# Patient Record
Sex: Female | Born: 1985
Health system: Southern US, Community
[De-identification: ages and names within clinical notes are randomized; demographics above are authoritative.]

## PROBLEM LIST (undated history)

## (undated) DIAGNOSIS — F1910 Other psychoactive substance abuse, uncomplicated: Secondary | ICD-10-CM

## (undated) DIAGNOSIS — F1120 Opioid dependence, uncomplicated: Secondary | ICD-10-CM

## (undated) HISTORY — PX: VALVE REPLACEMENT: SUR13

## (undated) HISTORY — PX: OTHER SURGICAL HISTORY: SHX169

---

## 2019-01-06 DIAGNOSIS — A419 Sepsis, unspecified organism: Secondary | ICD-10-CM

## 2019-01-06 HISTORY — DX: Sepsis, unspecified organism: A41.9

## 2019-01-07 DIAGNOSIS — R7881 Bacteremia: Secondary | ICD-10-CM

## 2019-01-07 DIAGNOSIS — I330 Acute and subacute infective endocarditis: Secondary | ICD-10-CM | POA: Insufficient documentation

## 2019-01-07 DIAGNOSIS — F1910 Other psychoactive substance abuse, uncomplicated: Secondary | ICD-10-CM | POA: Insufficient documentation

## 2019-01-07 HISTORY — DX: Bacteremia: R78.81

## 2019-01-07 HISTORY — DX: Other psychoactive substance abuse, uncomplicated: F19.10

## 2019-01-07 HISTORY — DX: Acute and subacute infective endocarditis: I33.0

## 2019-01-09 DIAGNOSIS — D649 Anemia, unspecified: Secondary | ICD-10-CM | POA: Insufficient documentation

## 2019-01-09 HISTORY — DX: Anemia, unspecified: D64.9

## 2019-01-10 DIAGNOSIS — B999 Unspecified infectious disease: Secondary | ICD-10-CM | POA: Insufficient documentation

## 2019-01-10 DIAGNOSIS — D509 Iron deficiency anemia, unspecified: Secondary | ICD-10-CM | POA: Insufficient documentation

## 2019-01-10 HISTORY — DX: Iron deficiency anemia, unspecified: D50.9

## 2019-01-11 DIAGNOSIS — S81802A Unspecified open wound, left lower leg, initial encounter: Secondary | ICD-10-CM | POA: Insufficient documentation

## 2019-01-11 HISTORY — DX: Unspecified open wound, left lower leg, initial encounter: S81.802A

## 2019-01-30 DIAGNOSIS — G4700 Insomnia, unspecified: Secondary | ICD-10-CM | POA: Insufficient documentation

## 2019-01-30 HISTORY — DX: Insomnia, unspecified: G47.00

## 2019-03-29 ENCOUNTER — Emergency Department (HOSPITAL_COMMUNITY): Payer: Medicaid Other

## 2019-03-29 ENCOUNTER — Emergency Department (HOSPITAL_BASED_OUTPATIENT_CLINIC_OR_DEPARTMENT_OTHER): Payer: Medicaid Other

## 2019-03-29 ENCOUNTER — Other Ambulatory Visit: Payer: Self-pay

## 2019-03-29 ENCOUNTER — Emergency Department (HOSPITAL_COMMUNITY)
Admission: EM | Admit: 2019-03-29 | Discharge: 2019-03-29 | Disposition: A | Discharge status: Home / Self Care | Payer: Medicaid Other | Attending: Emergency Medicine | Admitting: Emergency Medicine

## 2019-03-29 DIAGNOSIS — M7989 Other specified soft tissue disorders: Secondary | ICD-10-CM | POA: Diagnosis not present

## 2019-03-29 DIAGNOSIS — R609 Edema, unspecified: Secondary | ICD-10-CM | POA: Diagnosis present

## 2019-03-29 DIAGNOSIS — R52 Pain, unspecified: Secondary | ICD-10-CM

## 2019-03-29 DIAGNOSIS — R7989 Other specified abnormal findings of blood chemistry: Secondary | ICD-10-CM | POA: Insufficient documentation

## 2019-03-29 DIAGNOSIS — L03116 Cellulitis of left lower limb: Secondary | ICD-10-CM | POA: Diagnosis not present

## 2019-03-29 DIAGNOSIS — L538 Other specified erythematous conditions: Secondary | ICD-10-CM

## 2019-03-29 LAB — COMPREHENSIVE METABOLIC PANEL
ALT: 323 U/L — ABNORMAL HIGH (ref 0–44)
AST: 166 U/L — ABNORMAL HIGH (ref 15–41)
Albumin: 3.2 g/dL — ABNORMAL LOW (ref 3.5–5.0)
Alkaline Phosphatase: 131 U/L — ABNORMAL HIGH (ref 38–126)
Anion gap: 7 (ref 5–15)
BUN: 11 mg/dL (ref 6–20)
CO2: 27 mmol/L (ref 22–32)
Calcium: 8.3 mg/dL — ABNORMAL LOW (ref 8.9–10.3)
Chloride: 106 mmol/L (ref 98–111)
Creatinine, Ser: 0.5 mg/dL (ref 0.44–1.00)
GFR calc Af Amer: >60 mL/min (ref >60)
GFR calc non Af Amer: >60 mL/min (ref >60)
Glucose, Bld: 117 mg/dL — ABNORMAL HIGH (ref 70–99)
Potassium: 3.1 mmol/L — ABNORMAL LOW (ref 3.5–5.1)
Sodium: 140 mmol/L (ref 135–145)
Total Bilirubin: 0.5 mg/dL (ref 0.3–1.2)
Total Protein: 6.6 g/dL (ref 6.5–8.1)

## 2019-03-29 LAB — RAPID HIV SCREEN (HIV 1/2 AB+AG)
HIV 1/2 Antibodies: NONREACTIVE
HIV-1 P24 Antigen - HIV24: NONREACTIVE

## 2019-03-29 LAB — CBC
HCT: 34.2 % — ABNORMAL LOW (ref 36.0–46.0)
Hemoglobin: 10.6 g/dL — ABNORMAL LOW (ref 12.0–15.0)
MCH: 24.9 pg — ABNORMAL LOW (ref 26.0–34.0)
MCHC: 31 g/dL (ref 30.0–36.0)
MCV: 80.5 fL (ref 80.0–100.0)
Platelets: 198 10*3/uL (ref 150–400)
RBC: 4.25 MIL/uL (ref 3.87–5.11)
RDW: 15.9 % — ABNORMAL HIGH (ref 11.5–15.5)
WBC: 6.3 10*3/uL (ref 4.0–10.5)
nRBC: 0 % (ref 0.0–0.2)

## 2019-03-29 LAB — PROTIME-INR
INR: 1.1 (ref 0.8–1.2)
Prothrombin Time: 14.3 seconds (ref 11.4–15.2)

## 2019-03-29 LAB — I-STAT BETA HCG BLOOD, ED (MC, WL, AP ONLY): I-stat hCG, quantitative: <5 m[IU]/mL (ref <5)

## 2019-03-29 IMAGING — DX DG TIBIA/FIBULA 2V*L*
4 series · 4 of 4 positions shown · non-contrast
Comparison: None.

CLINICAL DATA: Cellulitis

EXAM:
LEFT TIBIA AND FIBULA - 2 VIEW

[tibia ap (1 of 2)]
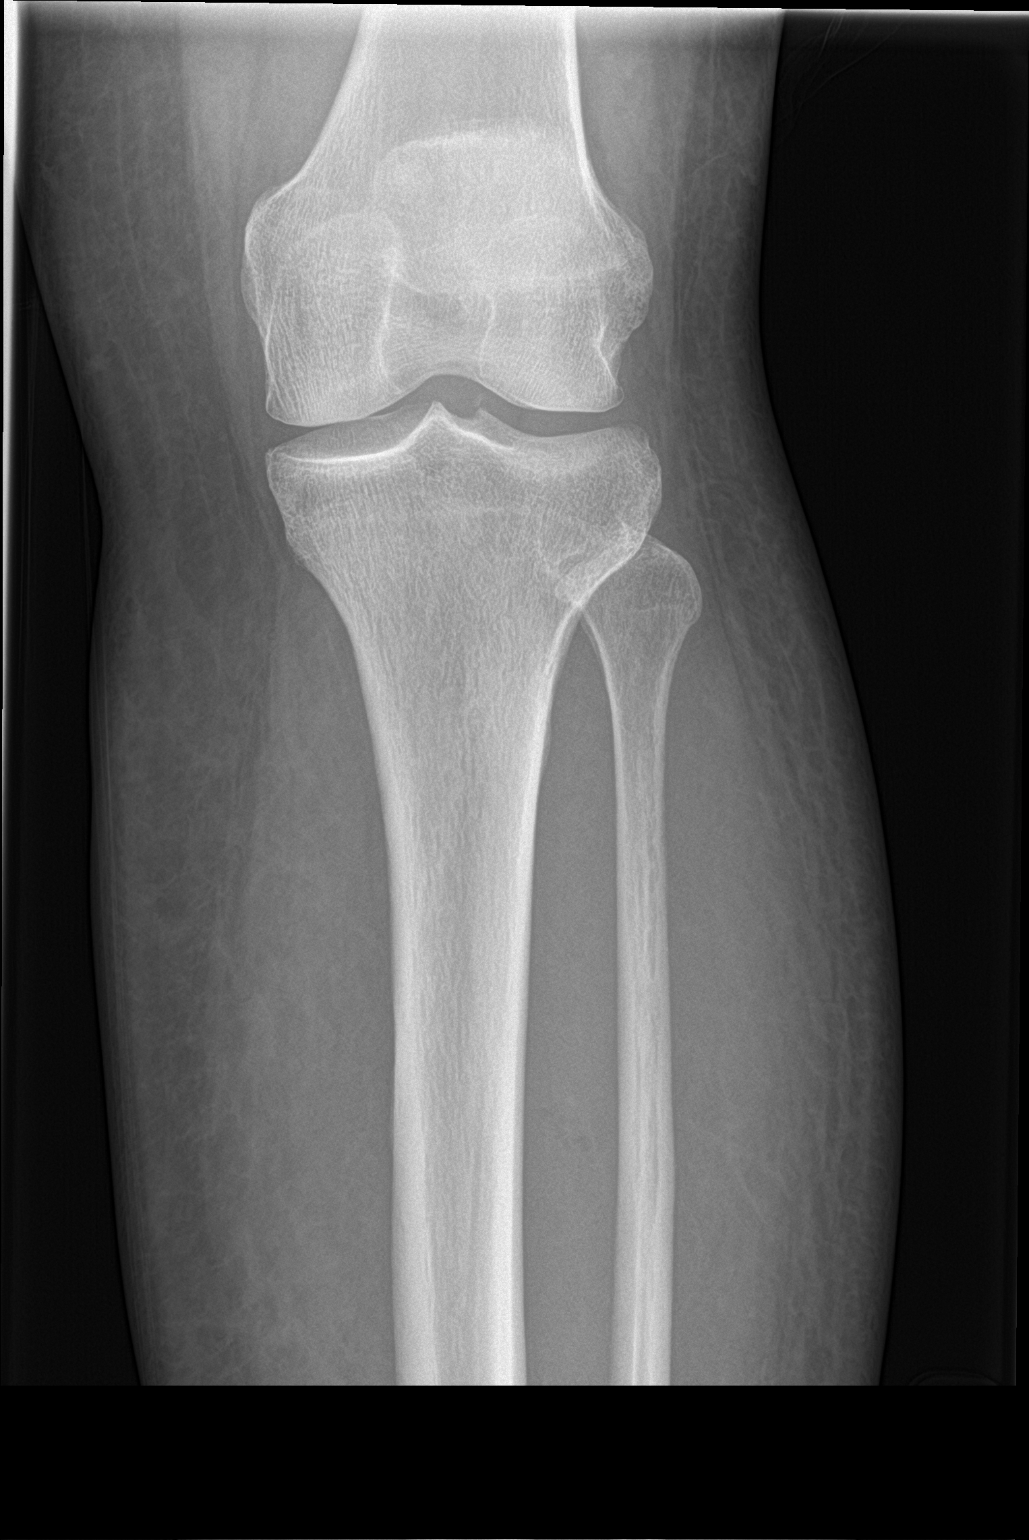

[tibia ap (2 of 2)]
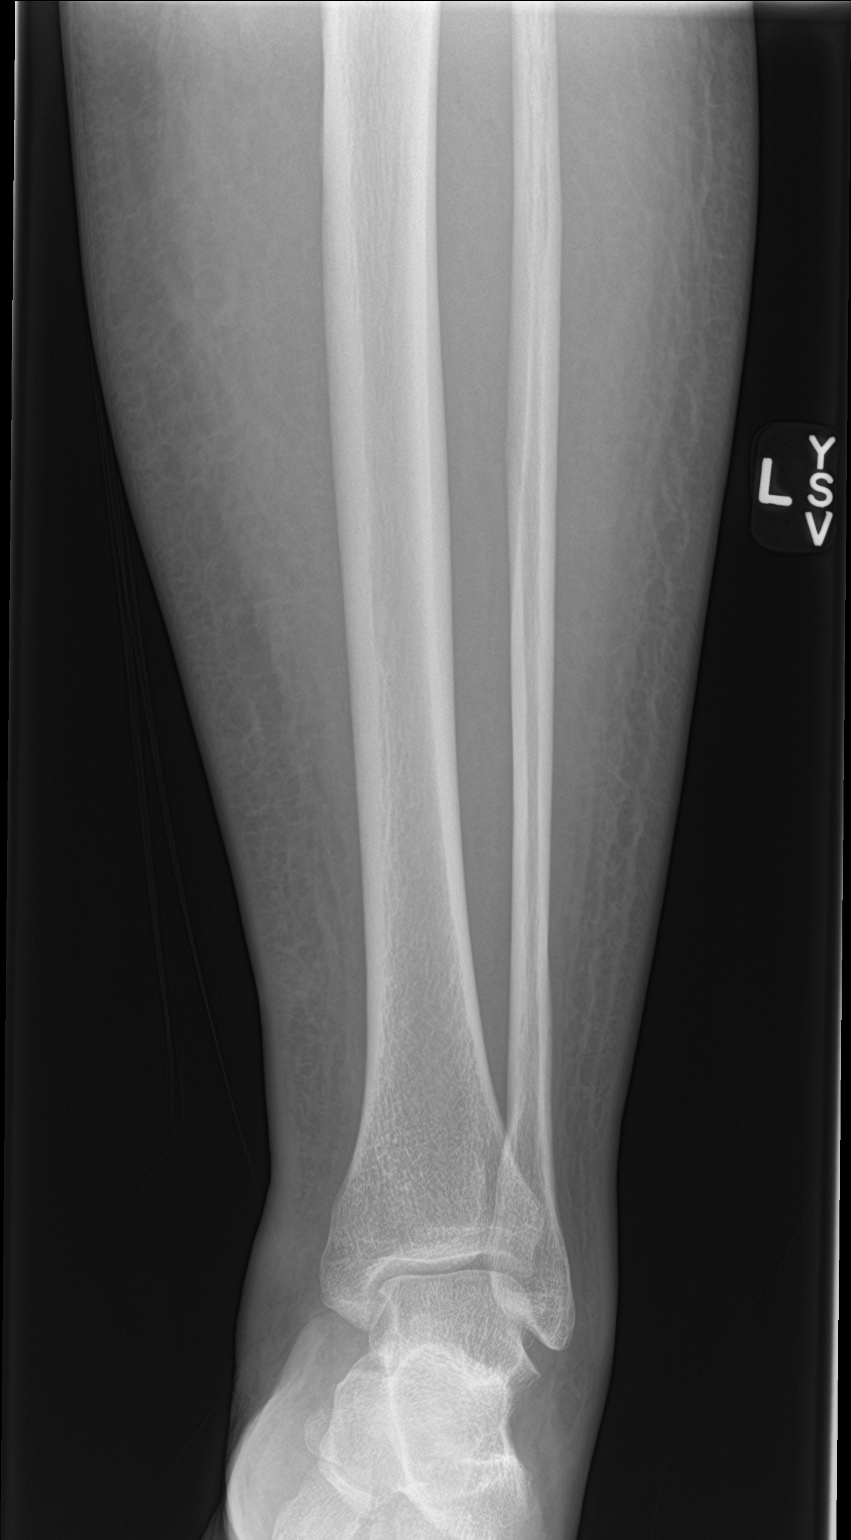

[tibia lat (1 of 2)]
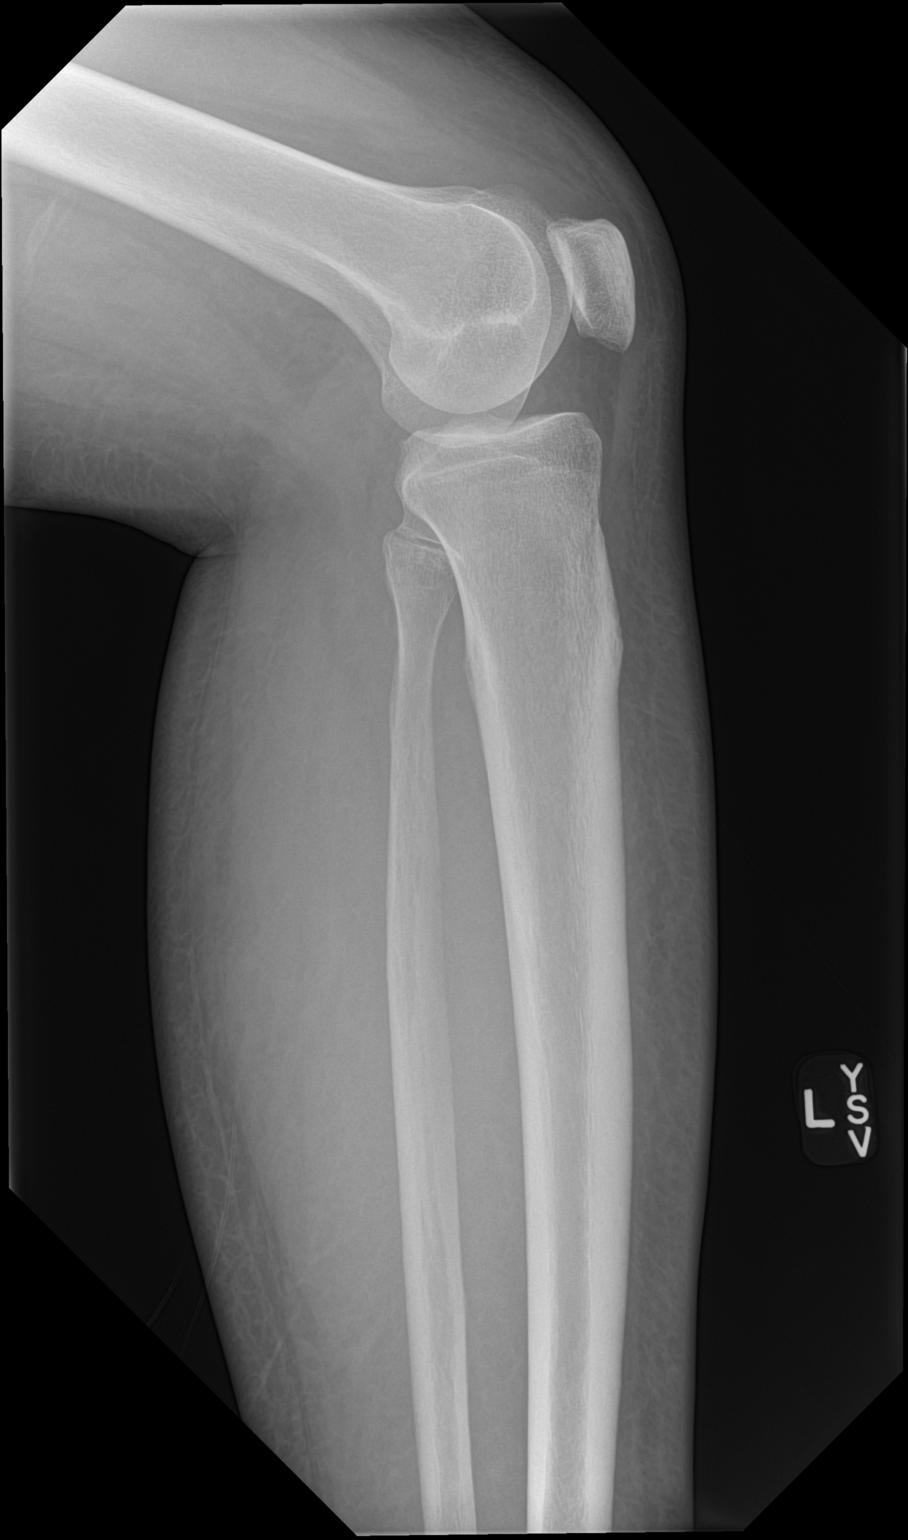

[tibia lat (2 of 2)]
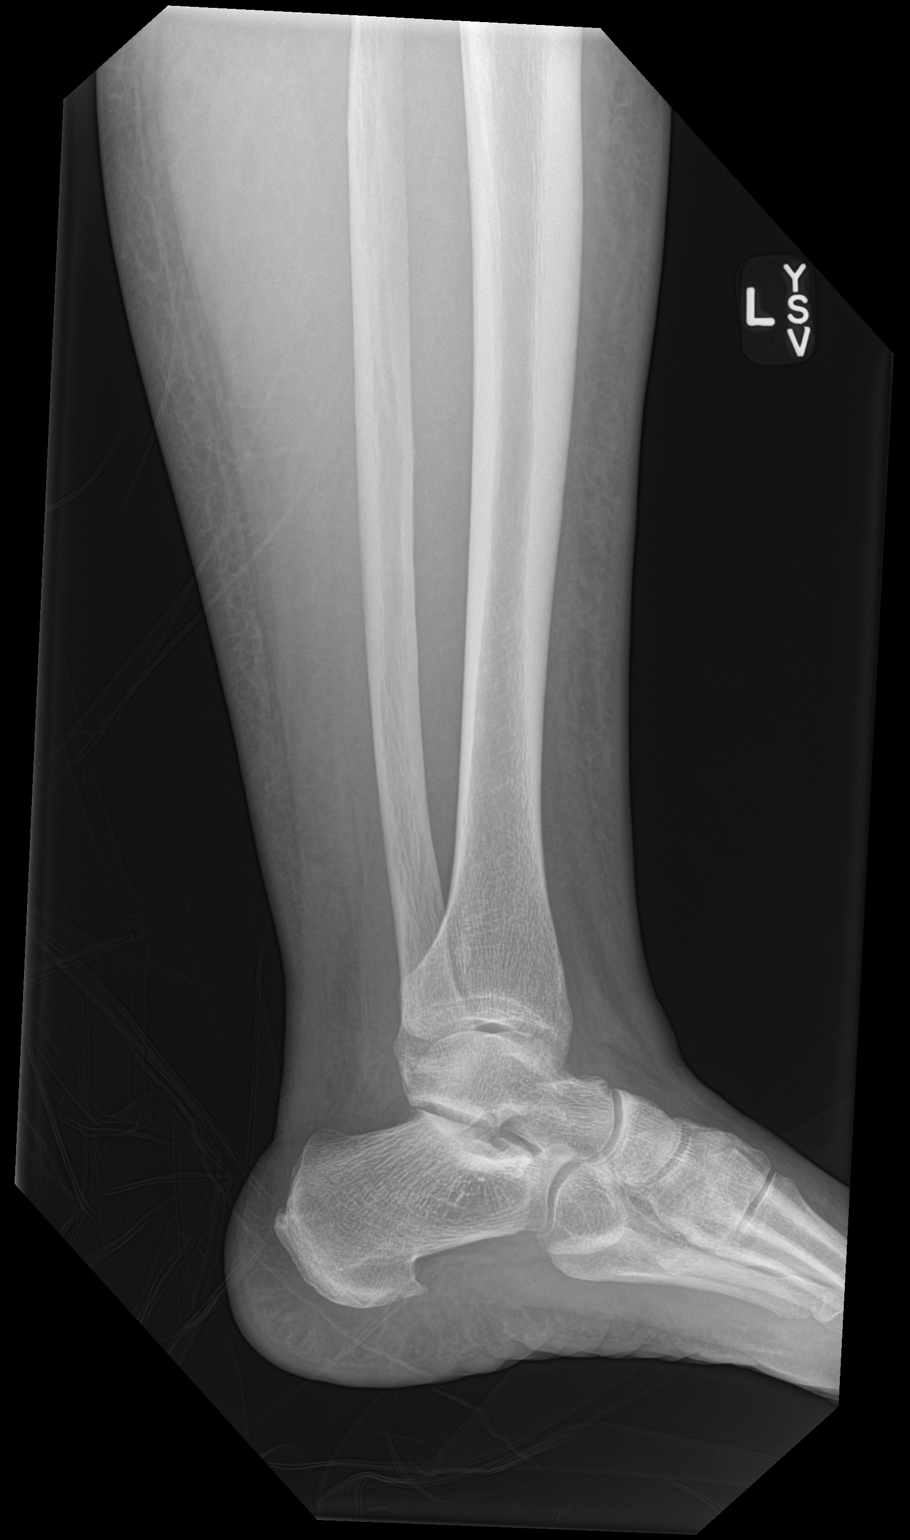

[4 of 4 positions shown; findings below may reference images not displayed]

FINDINGS: There is no evidence of fracture or dislocation. No focal osseous
demineralization to suggest osteomyelitis is identified. Plantar and
posterior calcaneal enthesophytes are noted. There is no soft tissue
gas or radiopaque foreign body.
IMPRESSION: No radiographic evidence of osteomyelitis. No soft tissue gas.

## 2019-03-29 MED ORDER — CLINDAMYCIN HCL 300 MG PO CAPS: 600.0000 mg | ORAL_CAPSULE | Freq: Three times a day (TID) | ORAL | 0 refills | Status: DC

## 2019-03-29 MED ORDER — SODIUM CHLORIDE 0.9% FLUSH: 3.0000 mL | Freq: Once | INTRAVENOUS | Status: DC

## 2019-03-29 NOTE — Progress Notes (Signed)
VASCULAR LAB PRELIMINARY  PRELIMINARY  PRELIMINARY  PRELIMINARY  Left lower extremity venous duplex completed.    Preliminary report:  See CV proc for preliminary results.   [place], Charge Nurse, report.  Eldrige Pitkin, RVT 03/29/2019, 6:46 PM

## 2019-03-29 NOTE — ED Provider Notes (Signed)
[facility] EMERGENCY DEPARTMENT Provider Note   CSN: [id number] Arrival date & time: 03/29/19  1727     History   Chief Complaint Chief Complaint  Patient presents with   Leg Swelling    HPI Latoya [name] is a 33 y.o. 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  No interventions.  No alleviating factors.  Exacerbated with walking, palpation.  Denies injury.  Denies loss of sensation or weakness distally.  Last heroin use was 3 months ago.  Has a wound in the left medial lower leg admits that this was an area she used to inject heroin into but has not used in 3 months.  No fever, chest pain, shortness of breath, cough.  No h/o DVT/PE, hormone therapy, recent travel or immobilization.  Last hospitalization and angio for vegetation debulking in September but no other recent surgeries.     HPI  No past medical history on file.  There are no active problems to display for this patient.   ** The histories are not reviewed yet. Please review them in the "History" navigator section and refresh this [place].   OB History   No obstetric history on file.      Home Medications    Prior to Admission medications   Medication Sig Start Date End Date Taking? Authorizing Provider  clindamycin (CLEOCIN) 300 MG capsule Take 2 capsules (600 mg total) by mouth 3 (three) times daily for 10 days. 03/29/19 04/08/19  Kinnie Feil, PA-C    Family History No family history on file.  Social History Social History   Tobacco Use   Smoking status: Not on file  Substance Use Topics   Alcohol use: Not on file   Drug use: Not on file     Allergies   Patient has no known allergies.   Review of Systems Review of Systems  Cardiovascular: Positive for leg  swelling (left).  Musculoskeletal: Positive for myalgias.  Skin: Positive for color change.  All other systems reviewed and are negative.    Physical Exam Updated Vital Signs BP 114/84 (BP Location: Right Arm)    Pulse 99    Temp 98.7 F (37.1 C) (Oral)    Resp 18    Ht 5\' 6"  (1.676 m)    Wt 72.6 kg    LMP 02/26/2019 (Approximate)    SpO2 100%    BMI 25.82 kg/m   Physical Exam Vitals signs and nursing note reviewed.  Constitutional:      Appearance: She is well-developed.     Comments: Non toxic in NAD  HENT:     Head: Normocephalic and atraumatic.     Nose: Nose normal.  Eyes:     Conjunctiva/sclera: Conjunctivae normal.  Neck:     Musculoskeletal: Normal range of motion.  Cardiovascular:     Rate and Rhythm: Normal rate and regular rhythm.     Comments:   1+ radial and DP pulses bilaterally.  Tenderness along distal left lower leg where there is erythema/cellulitis.   Pulmonary:     Effort: Pulmonary effort is normal.     Breath sounds: Normal breath sounds.  Abdominal:     General: Bowel sounds are normal.     Palpations: Abdomen is soft.     Comments: No G/R/R. No suprapubic or CVA tenderness. Negative Murphy's and  McBurney's. Active BS to lower quadrants.   Musculoskeletal: Normal range of motion.     Comments: No focal bony tenderness, edema or effusion of the left knee or popliteal space, full range of motion. Mild tenderness diffusely on the left ankle where there is erythema, edema/cellulitis.  Full range of motion of the ankle without significant pain.  No focal tenderness of the foot, toes. Compartments of the left thigh and left lower extremity soft  Skin:    General: Skin is warm and dry.     Capillary Refill: Capillary refill takes less than 2 seconds.     Comments: Well-demarcated erythema in the left lower extremity, see photo below.  Erythema extends from ankle up to mid tib/fib.  Associated swelling, warmth.  Old appearing scab/wound in the left lower  medial leg.  Neurological:     Mental Status: She is alert.     Comments:  sensation and strength is intact in upper and lower extremities.  Psychiatric:        Behavior: Behavior normal.        ED Treatments / Results  Labs (all labs ordered are listed, but only abnormal results are displayed) Labs Reviewed  COMPREHENSIVE METABOLIC PANEL - Abnormal; Notable for the following components:      Result Value   Potassium 3.1 (*)    Glucose, Bld 117 (*)    Calcium 8.3 (*)    Albumin 3.2 (*)    AST 166 (*)    ALT 323 (*)    Alkaline Phosphatase 131 (*)    All other components within normal limits  CBC - Abnormal; Notable for the following components:   Hemoglobin 10.6 (*)    HCT 34.2 (*)    MCH 24.9 (*)    RDW 15.9 (*)    All other components within normal limits  PROTIME-INR  RAPID HIV SCREEN (HIV 1/2 AB+AG)  HEPATITIS PANEL, ACUTE  I-STAT BETA HCG BLOOD, ED ([facility], [facility], [facility] ONLY)    EKG None  Radiology Dg Tibia/fibula Left  Result Date: 03/29/2019 CLINICAL DATA:  Cellulitis EXAM: LEFT TIBIA AND FIBULA - 2 VIEW COMPARISON:  None. FINDINGS: There is no evidence of fracture or dislocation. No focal osseous demineralization to suggest osteomyelitis is identified. Plantar and posterior calcaneal enthesophytes are noted. There is no soft tissue gas or radiopaque foreign body. IMPRESSION: No radiographic evidence of osteomyelitis. No soft tissue gas. Electronically Signed   By: Romona Curlsyler  Litton M.D.   On: 03/29/2019 20:13   Vas [place] Lower Extremity Venous (dvt) (only [facility])  Result Date: 03/29/2019  Lower Venous Study Indications: Pain, Swelling, and Erythema.  Limitations: Edema and patient's pain with touch in the calf. Comparison Study: No prior study on file for comparison. Performing Technologist: Sherren Kernsandace Kanady RVS  Examination Guidelines: A complete evaluation includes B-mode imaging, spectral Doppler, color Doppler, and power Doppler as needed of all accessible portions of each  vessel. Bilateral testing is considered an integral part of a complete examination. Limited examinations for reoccurring indications may be performed as noted.  +-----+---------------+---------+-----------+----------+--------------+  RIGHT Compressibility Phasicity Spontaneity Properties Thrombus Aging  +-----+---------------+---------+-----------+----------+--------------+  CFV   Full            Yes       Yes                                    +-----+---------------+---------+-----------+----------+--------------+   +---------+---------------+---------+-----------+----------+--------------+  LEFT      Compressibility Phasicity Spontaneity Properties Thrombus Aging  +---------+---------------+---------+-----------+----------+--------------+  CFV       Full            Yes       Yes                                    +---------+---------------+---------+-----------+----------+--------------+  SFJ       Full                                                             +---------+---------------+---------+-----------+----------+--------------+  FV Prox   Full                                                             +---------+---------------+---------+-----------+----------+--------------+  FV Mid    Full                                                             +---------+---------------+---------+-----------+----------+--------------+  FV Distal Full                                                             +---------+---------------+---------+-----------+----------+--------------+  PFV       Full                                                             +---------+---------------+---------+-----------+----------+--------------+  POP       Full            Yes       Yes                                    +---------+---------------+---------+-----------+----------+--------------+  PTV       Full                                                              +---------+---------------+---------+-----------+----------+--------------+  PERO      Full                                                             +---------+---------------+---------+-----------+----------+--------------+  Summary: Right: No evidence of common femoral vein obstruction. Left: There is no evidence of deep vein thrombosis in the lower extremity. Ultrasound characteristics of multiple enlarged lymph nodes noted in the groin and proximal thigh.  *See table(s) above for measurements and observations.    Preliminary     Procedures Procedures (including critical care time)  Medications Ordered in ED Medications  sodium chloride flush (NS) 0.9 % injection 3 mL (has no administration in time range)     Initial Impression / Assessment and Plan / ED Course  I have reviewed the triage vital signs and the nursing notes.  Pertinent labs & imaging results that were available during my care of the patient were reviewed by me and considered in my medical decision making (see chart for details).  Clinical Course as of Mar 29 2139  Sun Mar 29, 2019  1930 AST(!): 166 [CG]  1930 ALT(!): 323 [CG]  1930 Alkaline Phosphatase(!): 131 [CG]  1930 Hemoglobin(!): 10.6 [CG]  1930 Potassium(!): 3.1 [CG]  2052 IMPRESSION: No radiographic evidence of osteomyelitis. No soft tissue gas.    DG Tibia/Fibula Left [CG]    Clinical Course User Index [CG] Liberty Handy, PA-C   ER work-up initiated in triage, reviewed and interpreted by me.  Vascular ultrasound is negative for DVT but shows some reactive local lymphadenopathy.  Clinically leg redness swelling warmth and pain likely from cellulitis.  There is a scab in the left lower medial aspect of the leg where patient states she has injected IV drugs into before, this appears to be the epicenter of the erythema/cellulitis.  Extremities neurovascularly intact.  Compartments are soft.  Given history of IV drug use, x-ray was obtained to  evaluate for foreign bodies which was negative.  No signs of osteomyelitis or soft tissue gas.  No leukocytosis.  No signs of SIRS.  She is appropriate for outpatient antibiotics, NSAIDs, elevation.  I marked the area of erythema and instructed patient to monitor for worsening redness in the next 48 hours of antibiotics.  She understands reasons to return to the ER for reevaluation.  No CP, cough, SOB to suggest more occult infection like endocarditis, etc.   Lab work obtained at triage including LFTs noted to be elevated.  Patient denies recent infectious symptoms like fever, nausea, vomiting or diarrhea.  Denies history of hepatitis, states when she was admitted 2 months ago to the hospital she had tests done that were negative and states she has not used IV drugs since.  No abdominal tenderness, negative Murphy sign.  Vital signs normal without fever.  Given elevation of LFTs, IV drug use, acute hepatitis panel and HIV ordered and pending at discharge.  Explained this to patient who understands she needs to follow-up on the results and schedule an appointment with PCP for repeat LFTs and likely outpatient ultrasound.  Return precautions discussed.  She is comfortable with this.  Final Clinical Impressions(s) / ED Diagnoses   Final diagnoses:  Cellulitis of left leg  Elevated LFTs    ED Discharge Orders         Ordered    clindamycin (CLEOCIN) 300 MG capsule  3 times daily     03/29/19 2120           Jerrell Mylar 03/29/19 2140    Derwood Kaplan, MD 03/31/19 2308

## 2019-03-29 NOTE — ED Notes (Signed)
Patient transported to X-ray 

## 2019-03-29 NOTE — Discharge Instructions (Signed)
You were seen in the ER for swelling redness and warmth in your left leg.  Ultrasound did not reveal a blood clot.  You have an infection of the superficial skin that is called cellulitis.  This is treated with antibiotics.  Take clindamycin as prescribed.  Elevate your leg.  Apply moist heat to the area to help with infection.  Alternate ibuprofen or acetaminophen every 6-8 hours for pain.  Any infection can worsen, return to the ER of the redness is past the marked lines despite antibiotics for 48 hours.  Return to the ER if there is fever greater than 100, generalized malaise.  Your liver function tests were elevated today, the cause of this is still unclear.  You do not have any infection symptoms or symptoms that would suggest a gallbladder infection or blockage.  We have tested you for hepatitis and HIV as these are some reasons your liver function tests can be elevated.  You need to follow-up with a primary care doctor in the next few weeks to have this blood test rechecked to make sure your liver function tests have normalized.  You may also need an outpatient ultrasound of your liver.  Return to the ER if there is any chest pain, shortness of breath, fever, abdominal pain, vomiting or diarrhea.

## 2019-03-29 NOTE — ED Triage Notes (Signed)
The pt is c/o lt leg pain and swelling for 3-4 days  No known injury  No history  lmp one month ago

## 2019-03-30 LAB — HEPATITIS PANEL, ACUTE
HCV Ab: REACTIVE — AB
Hep A IgM: NONREACTIVE
Hep B C IgM: NONREACTIVE
Hepatitis B Surface Ag: NONREACTIVE

## 2019-04-04 ENCOUNTER — Emergency Department (HOSPITAL_COMMUNITY): Payer: Medicaid Other

## 2019-04-04 ENCOUNTER — Inpatient Hospital Stay (HOSPITAL_COMMUNITY)
Admission: EM | Admit: 2019-04-04 | Discharge: 2019-04-07 | DRG: 580 | Disposition: A | Discharge status: Home / Self Care | Payer: Medicaid Other | Attending: Student in an Organized Health Care Education/Training Program | Admitting: Student in an Organized Health Care Education/Training Program

## 2019-04-04 ENCOUNTER — Encounter (HOSPITAL_COMMUNITY): Payer: Self-pay | Admitting: Emergency Medicine

## 2019-04-04 ENCOUNTER — Other Ambulatory Visit: Payer: Self-pay

## 2019-04-04 DIAGNOSIS — B1920 Unspecified viral hepatitis C without hepatic coma: Secondary | ICD-10-CM | POA: Diagnosis present

## 2019-04-04 DIAGNOSIS — G4700 Insomnia, unspecified: Secondary | ICD-10-CM | POA: Diagnosis present

## 2019-04-04 DIAGNOSIS — R768 Other specified abnormal immunological findings in serum: Secondary | ICD-10-CM | POA: Diagnosis not present

## 2019-04-04 DIAGNOSIS — L0291 Cutaneous abscess, unspecified: Secondary | ICD-10-CM | POA: Diagnosis present

## 2019-04-04 DIAGNOSIS — D649 Anemia, unspecified: Secondary | ICD-10-CM | POA: Diagnosis present

## 2019-04-04 DIAGNOSIS — Z79899 Other long term (current) drug therapy: Secondary | ICD-10-CM

## 2019-04-04 DIAGNOSIS — Z8679 Personal history of other diseases of the circulatory system: Secondary | ICD-10-CM | POA: Diagnosis not present

## 2019-04-04 DIAGNOSIS — Z20828 Contact with and (suspected) exposure to other viral communicable diseases: Secondary | ICD-10-CM | POA: Diagnosis present

## 2019-04-04 DIAGNOSIS — L03115 Cellulitis of right lower limb: Secondary | ICD-10-CM

## 2019-04-04 DIAGNOSIS — L02415 Cutaneous abscess of right lower limb: Secondary | ICD-10-CM | POA: Diagnosis present

## 2019-04-04 DIAGNOSIS — F1123 Opioid dependence with withdrawal: Secondary | ICD-10-CM | POA: Diagnosis present

## 2019-04-04 DIAGNOSIS — F1199 Opioid use, unspecified with unspecified opioid-induced disorder: Secondary | ICD-10-CM | POA: Diagnosis present

## 2019-04-04 DIAGNOSIS — L02416 Cutaneous abscess of left lower limb: Secondary | ICD-10-CM | POA: Diagnosis present

## 2019-04-04 DIAGNOSIS — F1190 Opioid use, unspecified, uncomplicated: Secondary | ICD-10-CM | POA: Diagnosis not present

## 2019-04-04 DIAGNOSIS — L03116 Cellulitis of left lower limb: Secondary | ICD-10-CM | POA: Diagnosis present

## 2019-04-04 DIAGNOSIS — R011 Cardiac murmur, unspecified: Secondary | ICD-10-CM

## 2019-04-04 DIAGNOSIS — Z590 Homelessness: Secondary | ICD-10-CM

## 2019-04-04 DIAGNOSIS — Z888 Allergy status to other drugs, medicaments and biological substances status: Secondary | ICD-10-CM | POA: Diagnosis not present

## 2019-04-04 DIAGNOSIS — F17210 Nicotine dependence, cigarettes, uncomplicated: Secondary | ICD-10-CM | POA: Diagnosis present

## 2019-04-04 DIAGNOSIS — Z8619 Personal history of other infectious and parasitic diseases: Secondary | ICD-10-CM | POA: Diagnosis not present

## 2019-04-04 DIAGNOSIS — F1193 Opioid use, unspecified with withdrawal: Secondary | ICD-10-CM | POA: Diagnosis not present

## 2019-04-04 DIAGNOSIS — B950 Streptococcus, group A, as the cause of diseases classified elsewhere: Secondary | ICD-10-CM | POA: Diagnosis not present

## 2019-04-04 HISTORY — DX: Other psychoactive substance abuse, uncomplicated: F19.10

## 2019-04-04 LAB — CBC WITH DIFFERENTIAL/PLATELET
Abs Immature Granulocytes: 0.03 10*3/uL (ref 0.00–0.07)
Basophils Absolute: 0 10*3/uL (ref 0.0–0.1)
Basophils Relative: 0 %
Eosinophils Absolute: 0.2 10*3/uL (ref 0.0–0.5)
Eosinophils Relative: 2 %
HCT: 39.8 % (ref 36.0–46.0)
Hemoglobin: 11.9 g/dL — ABNORMAL LOW (ref 12.0–15.0)
Immature Granulocytes: 0 %
Lymphocytes Relative: 34 %
Lymphs Abs: 2.6 10*3/uL (ref 0.7–4.0)
MCH: 24.4 pg — ABNORMAL LOW (ref 26.0–34.0)
MCHC: 29.9 g/dL — ABNORMAL LOW (ref 30.0–36.0)
MCV: 81.6 fL (ref 80.0–100.0)
Monocytes Absolute: 0.5 10*3/uL (ref 0.1–1.0)
Monocytes Relative: 7 %
Neutro Abs: 4.4 10*3/uL (ref 1.7–7.7)
Neutrophils Relative %: 57 %
Platelets: 404 10*3/uL — ABNORMAL HIGH (ref 150–400)
RBC: 4.88 MIL/uL (ref 3.87–5.11)
RDW: 16.5 % — ABNORMAL HIGH (ref 11.5–15.5)
WBC: 7.7 10*3/uL (ref 4.0–10.5)
nRBC: 0 % (ref 0.0–0.2)

## 2019-04-04 LAB — BASIC METABOLIC PANEL
Anion gap: 11 (ref 5–15)
BUN: 9 mg/dL (ref 6–20)
CO2: 24 mmol/L (ref 22–32)
Calcium: 9.2 mg/dL (ref 8.9–10.3)
Chloride: 105 mmol/L (ref 98–111)
Creatinine, Ser: 0.54 mg/dL (ref 0.44–1.00)
GFR calc Af Amer: >60 mL/min (ref >60)
GFR calc non Af Amer: >60 mL/min (ref >60)
Glucose, Bld: 95 mg/dL (ref 70–99)
Potassium: 3.9 mmol/L (ref 3.5–5.1)
Sodium: 140 mmol/L (ref 135–145)

## 2019-04-04 LAB — LACTIC ACID, PLASMA: Lactic Acid, Venous: 1.1 mmol/L (ref 0.5–1.9)

## 2019-04-04 LAB — I-STAT BETA HCG BLOOD, ED (MC, WL, AP ONLY): I-stat hCG, quantitative: <5 m[IU]/mL (ref <5)

## 2019-04-04 IMAGING — CR DG TIBIA/FIBULA 2V*L*
4 series · 4 of 4 positions shown · non-contrast
Comparison: Plain films left lower leg [DATE].

CLINICAL DATA: Skin wound on the anterior aspect of the left lower
leg due to IV drug abuse.

EXAM:
LEFT TIBIA AND FIBULA - 2 VIEW

[tibia ap (1 of 2)]
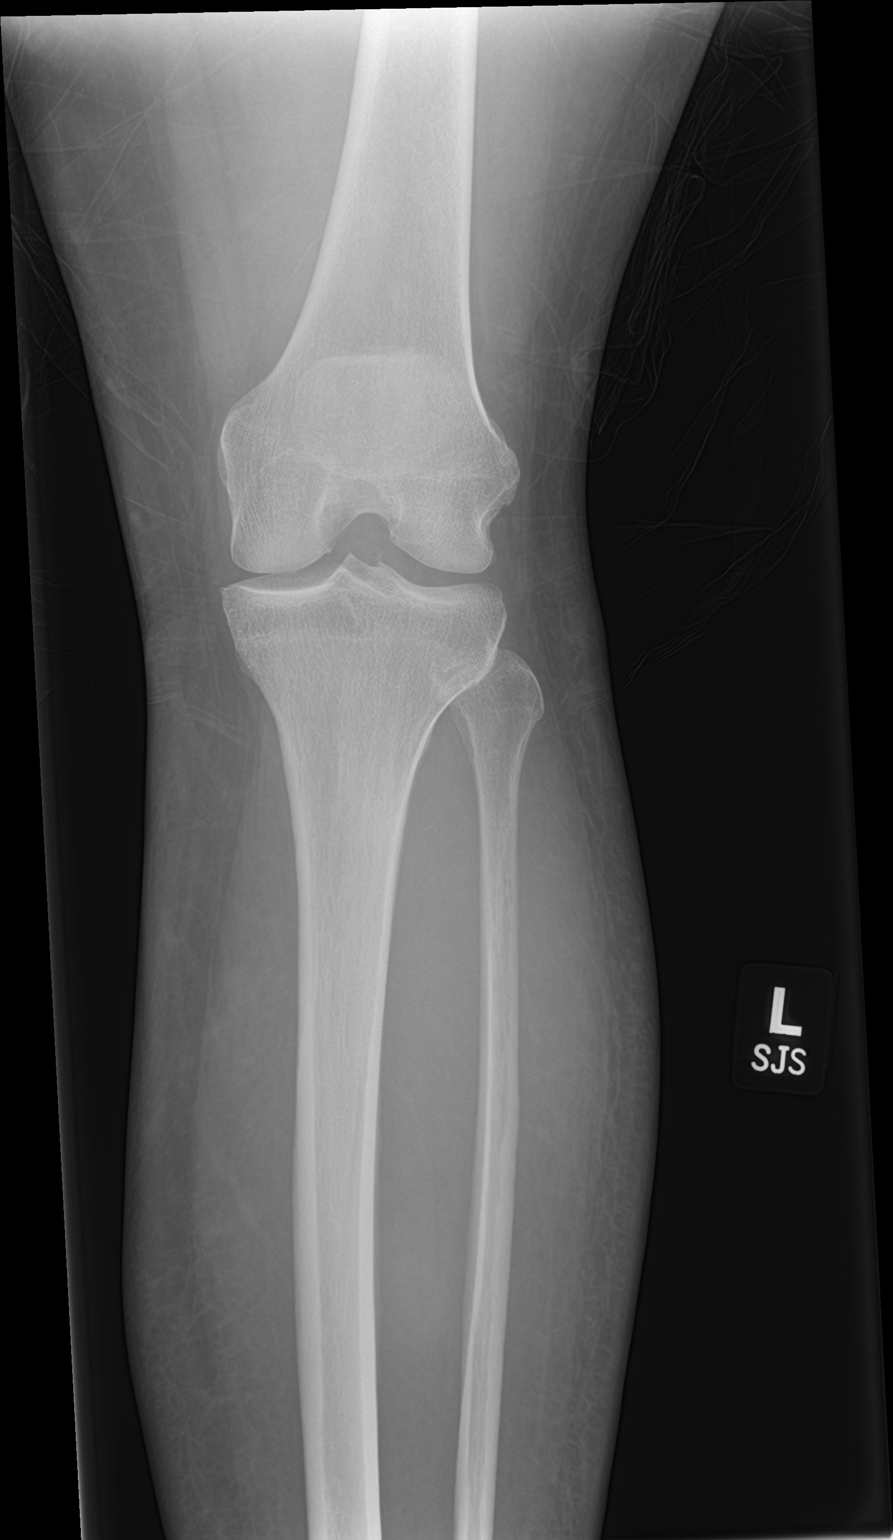

[tibia ap (2 of 2)]
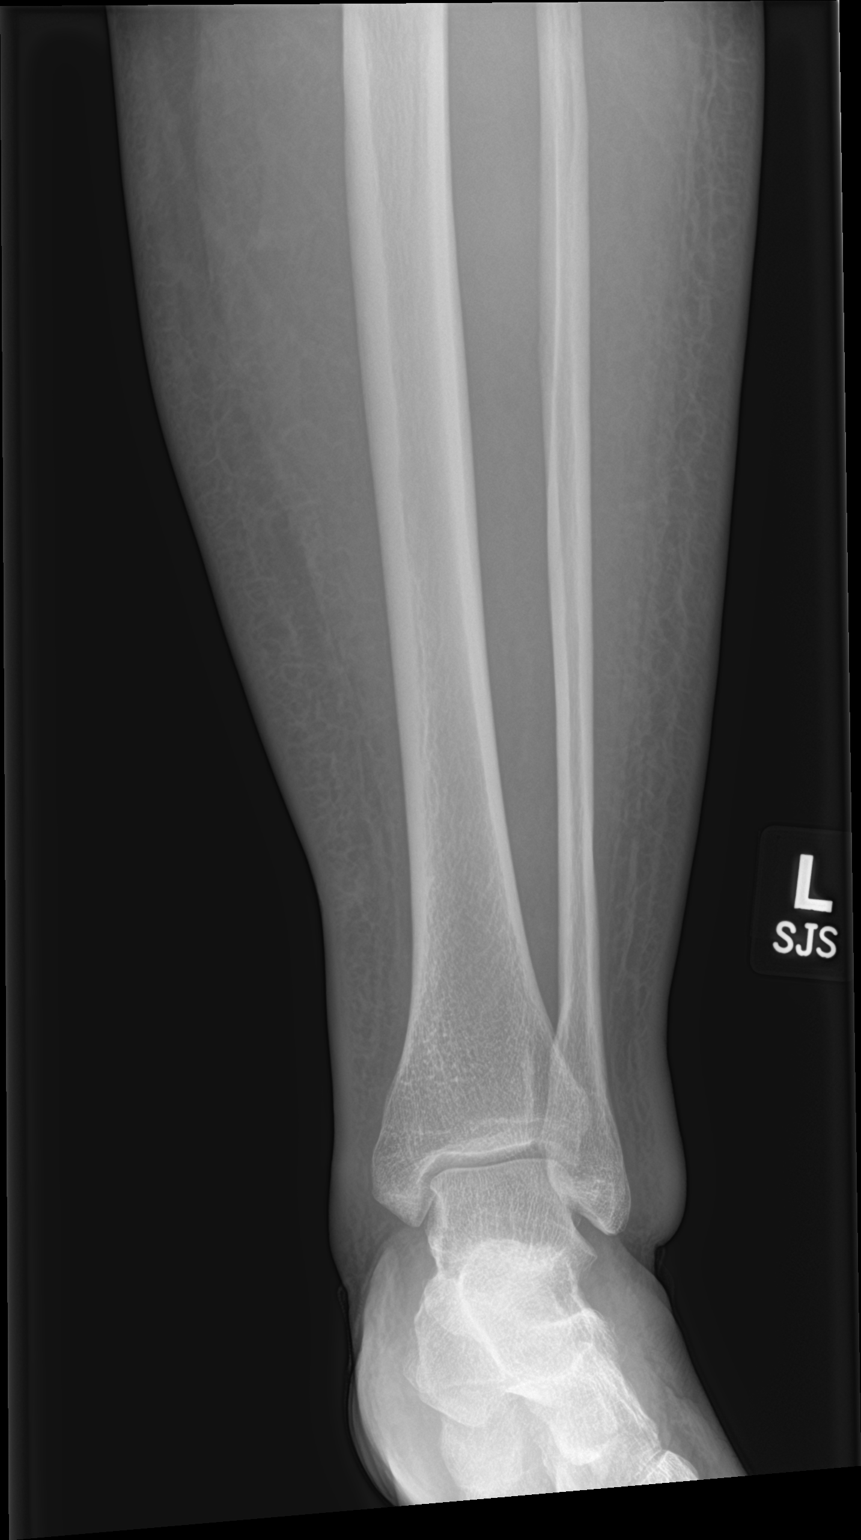

[tibia lat (1 of 2)]
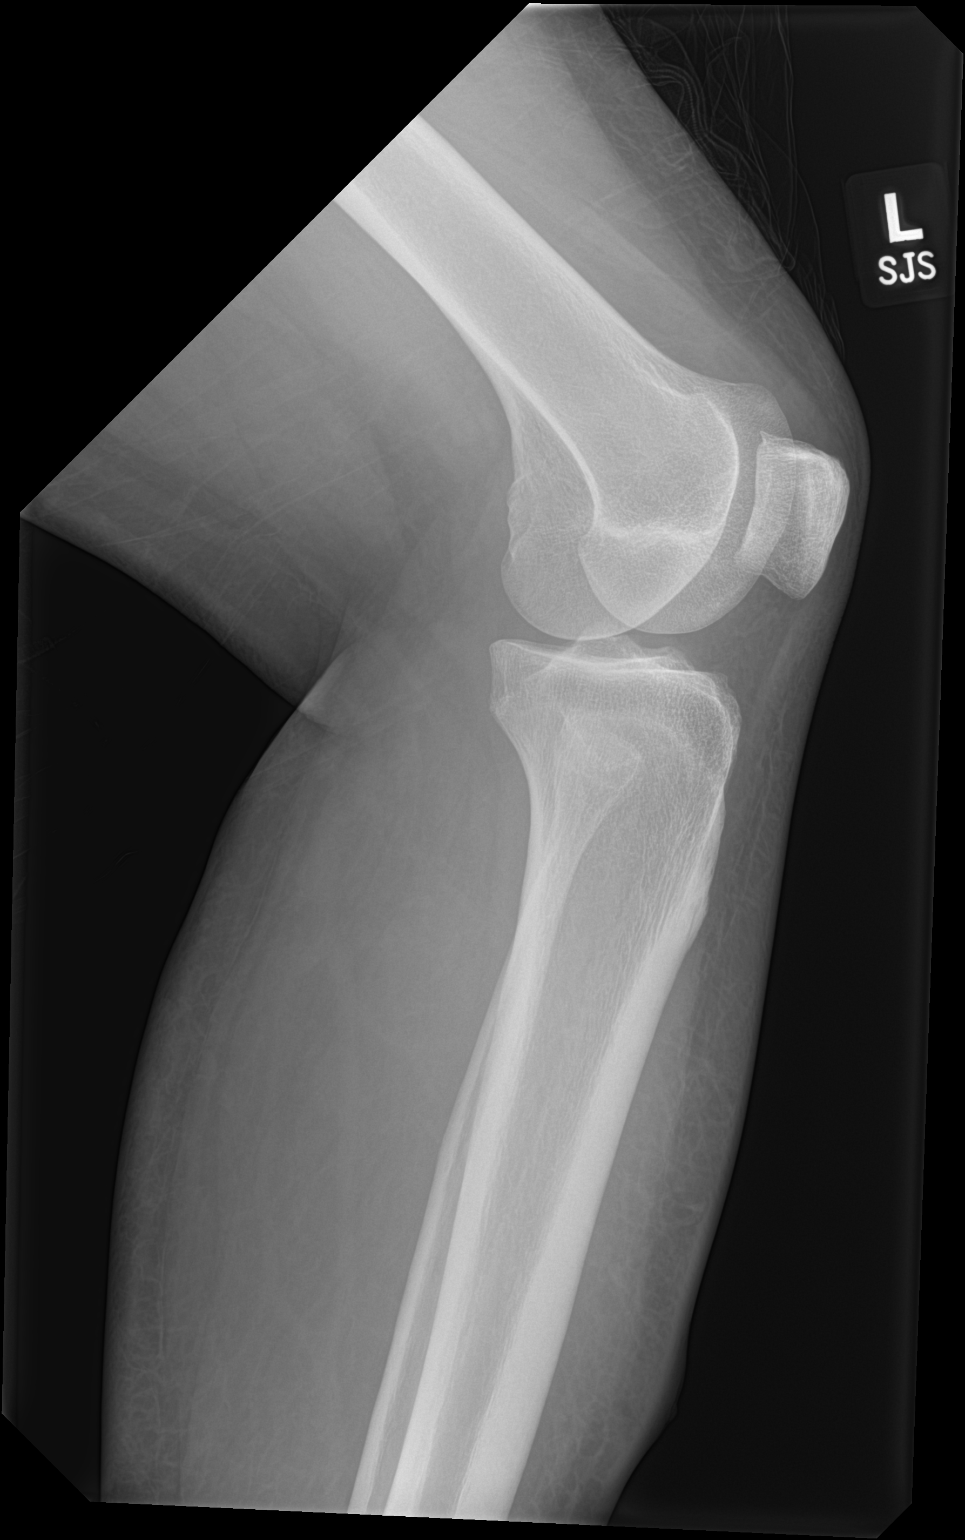

[tibia lat (2 of 2)]
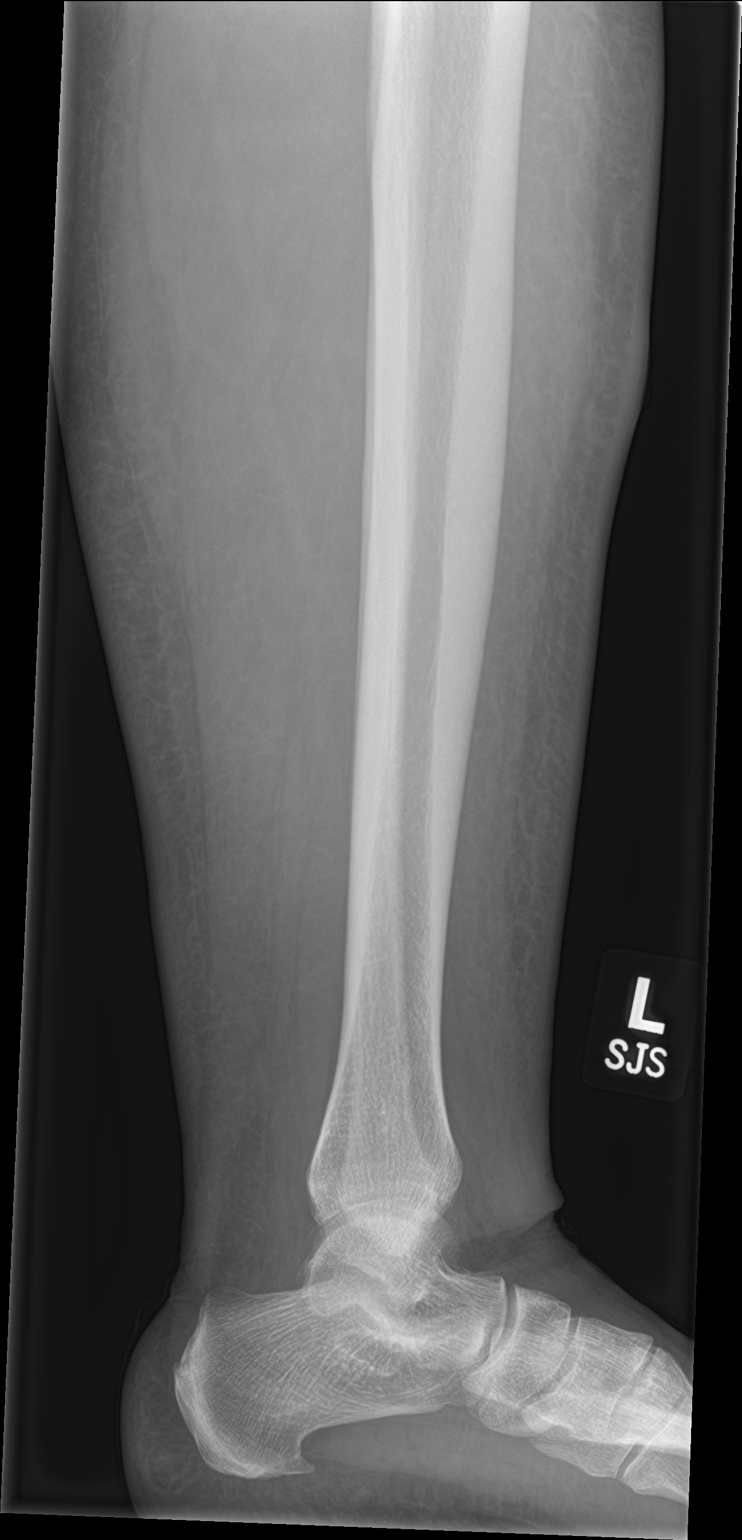

[4 of 4 positions shown; findings below may reference images not displayed]

FINDINGS: There is infiltration of subcutaneous fat throughout the lower leg.
No radiopaque foreign body or soft tissue gas. No bony abnormality.
IMPRESSION: Infiltration of subcutaneous fat could be due to dependent change or
cellulitis. The exam is otherwise negative. No change compared to
the prior study.

## 2019-04-04 MED ORDER — VANCOMYCIN HCL 10 G IV SOLR
1250.0000 mg | Freq: Two times a day (BID) | INTRAVENOUS | Status: DC
  Administered 2019-04-04 – 2019-04-07 (×6): 1250 mg via INTRAVENOUS
  Filled 2019-04-04 (×7): qty 1250

## 2019-04-04 MED ORDER — MORPHINE SULFATE (PF) 4 MG/ML IV SOLN
4.0000 mg | Freq: Once | INTRAVENOUS | Status: AC
  Administered 2019-04-04: 4 mg via INTRAVENOUS
  Filled 2019-04-04: qty 1

## 2019-04-04 MED ORDER — ENOXAPARIN SODIUM 40 MG/0.4ML ~~LOC~~ SOLN
40.0000 mg | SUBCUTANEOUS | Status: DC
  Filled 2019-04-04 (×2): qty 0.4

## 2019-04-04 MED ORDER — SODIUM CHLORIDE 0.9 % IV BOLUS
1000.0000 mL | Freq: Once | INTRAVENOUS | Status: AC
  Administered 2019-04-04: 1000 mL via INTRAVENOUS

## 2019-04-04 MED ORDER — LIDOCAINE HCL (PF) 1 % IJ SOLN
5.0000 mL | Freq: Once | INTRAMUSCULAR | Status: AC
  Administered 2019-04-04: 5 mL
  Filled 2019-04-04: qty 5

## 2019-04-04 MED ORDER — SODIUM CHLORIDE 0.9% FLUSH: 10.0000 mL | INTRAVENOUS | Status: DC | PRN

## 2019-04-04 MED ORDER — ACETAMINOPHEN 325 MG PO TABS
650.0000 mg | ORAL_TABLET | Freq: Four times a day (QID) | ORAL | Status: DC | PRN
  Administered 2019-04-07: 650 mg via ORAL
  Filled 2019-04-04: qty 2

## 2019-04-04 MED ORDER — VANCOMYCIN HCL IN DEXTROSE 1-5 GM/200ML-% IV SOLN: 1000.0000 mg | Freq: Once | INTRAVENOUS | Status: DC

## 2019-04-04 MED ORDER — ONDANSETRON HCL 4 MG/2ML IJ SOLN: 4.0000 mg | Freq: Four times a day (QID) | INTRAMUSCULAR | Status: DC | PRN

## 2019-04-04 MED ORDER — SODIUM CHLORIDE 0.9% FLUSH
3.0000 mL | Freq: Two times a day (BID) | INTRAVENOUS | Status: DC
  Administered 2019-04-05 – 2019-04-07 (×4): 3 mL via INTRAVENOUS

## 2019-04-04 MED ORDER — ONDANSETRON HCL 4 MG PO TABS: 4.0000 mg | ORAL_TABLET | Freq: Four times a day (QID) | ORAL | Status: DC | PRN

## 2019-04-04 MED ORDER — TETANUS-DIPHTH-ACELL PERTUSSIS 5-2.5-18.5 LF-MCG/0.5 IM SUSP
0.5000 mL | Freq: Once | INTRAMUSCULAR | Status: AC
  Administered 2019-04-04: 0.5 mL via INTRAMUSCULAR
  Filled 2019-04-04: qty 0.5

## 2019-04-04 MED ORDER — ACETAMINOPHEN 650 MG RE SUPP: 650.0000 mg | Freq: Four times a day (QID) | RECTAL | Status: DC | PRN

## 2019-04-04 MED ORDER — HYDROMORPHONE HCL 1 MG/ML IJ SOLN
1.0000 mg | INTRAMUSCULAR | Status: DC | PRN
  Administered 2019-04-04 – 2019-04-05 (×3): 1 mg via INTRAVENOUS
  Filled 2019-04-04 (×3): qty 1

## 2019-04-04 MED ORDER — VANCOMYCIN HCL IN DEXTROSE 1-5 GM/200ML-% IV SOLN
1000.0000 mg | Freq: Once | INTRAVENOUS | Status: DC
  Filled 2019-04-04: qty 200

## 2019-04-04 NOTE — ED Triage Notes (Signed)
Pt here for recurrent cellulitis to left lower extremity with drainage noted to site. Pt states the wound is from IV drug use. Currently on an antibiotic but unsure which.

## 2019-04-04 NOTE — Progress Notes (Signed)
Refused Lovenox for DVT prevention.  Continue education on why it is important to take and risks associated with refusal.

## 2019-04-04 NOTE — Progress Notes (Signed)
Pharmacy Antibiotic Note  Latoya [name] is a 33 y.o. female admitted on 04/04/2019 with cellulitis.  Pharmacy has been consulted for Vancomycin dosing.  Plan: - No loading dose indicated for cellulitis  - Vancomycin 1250 mg IV q12h  - Est Auc 523  - Monitor renal function and opportunities for deescalating abx   Height: 5\' 6"  (167.6 cm) Weight: 175 lb (79.4 kg) IBW/kg (Calculated) : 59.3  Temp (24hrs), Avg:98.4 F (36.9 C), Min:98.4 F (36.9 C), Max:98.4 F (36.9 C)  Recent Labs  Lab 03/29/19 1755 04/04/19 1851  WBC 6.3 7.7  CREATININE 0.50 0.54  LATICACIDVEN  --  1.1    Estimated Creatinine Clearance: 108.3 mL/min (by C-G formula based on SCr of 0.54 mg/dL).    Allergies  Allergen Reactions  . Naltrexone Anxiety    Severe anxiety, restlessness, vomiting    Antimicrobials this admission: 11/28 Vancomycin >>    Dose adjustments this admission: N/a  Microbiology results: 11/28 BCx: Pending  11/28 Wound Cx: Pending    Thank you for allowing pharmacy to be a part of this patient's care.  Duanne Limerick PharmD. BCPS  04/04/2019 9:09 PM

## 2019-04-04 NOTE — ED Notes (Signed)
ED TO INPATIENT HANDOFF REPORT  ED Nurse Name and Phone #: Lattie Haw RN [phone]  S Name/Age/Gender Latoya [name] 33 y.o. female Room/Bed: 041C/041C  Code Status   Code Status: Full Code  Home/SNF/Other Home Patient oriented to: self, place, time and situation Is this baseline? Yes   Triage Complete: Triage complete  Chief Complaint Abscess  Triage Note Pt here for recurrent cellulitis to left lower extremity with drainage noted to site. Pt states the wound is from IV drug use. Currently on an antibiotic but unsure which.    Allergies Allergies  Allergen Reactions  . Naltrexone Anxiety    Severe anxiety, restlessness, vomiting    Level of Care/Admitting Diagnosis ED Disposition    ED Disposition Condition [place]: [place] [100100]  Level of Care: Med-Surg [16]  Covid Evaluation: Asymptomatic Screening Protocol (No Symptoms)  Diagnosis: Cellulitis and abscess of right leg [1025852]  Admitting Physician: Axel Filler [[id number]]  Attending Physician: Axel Filler [[id number]]  Estimated length of stay: past midnight tomorrow  Certification:: I certify this patient will need inpatient services for at least 2 midnights  PT Class (Do Not Modify): Inpatient [101]  PT Acc Code (Do Not Modify): Private [1]       B Medical/Surgery History Past Medical History:  Diagnosis Date  . IV drug abuse ([place])      A IV Location/Drains/Wounds Patient Lines/Drains/Airways Status   Active Line/Drains/Airways    Name:   Placement date:   Placement time:   Site:   Days:   Midline Single Lumen 04/04/19 Midline Right Basilic 10 cm 0 cm   15/40/08    6761    Basilic   less than 1          Intake/Output Last 24 hours No intake or output data in the 24 hours ending 04/04/19 2120  Labs/Imaging Results for orders placed or performed during the hospital encounter of 04/04/19 (from the past 48 hour(s))  CBC with  Differential     Status: Abnormal   Collection Time: 04/04/19  6:51 PM  Result Value Ref Range   WBC 7.7 4.0 - 10.5 K/uL   RBC 4.88 3.87 - 5.11 MIL/uL   Hemoglobin 11.9 (L) 12.0 - 15.0 g/dL   HCT 39.8 36.0 - 46.0 %   MCV 81.6 80.0 - 100.0 fL   MCH 24.4 (L) 26.0 - 34.0 pg   MCHC 29.9 (L) 30.0 - 36.0 g/dL   RDW 16.5 (H) 11.5 - 15.5 %   Platelets 404 (H) 150 - 400 K/uL   nRBC 0.0 0.0 - 0.2 %   Neutrophils Relative % 57 %   Neutro Abs 4.4 1.7 - 7.7 K/uL   Lymphocytes Relative 34 %   Lymphs Abs 2.6 0.7 - 4.0 K/uL   Monocytes Relative 7 %   Monocytes Absolute 0.5 0.1 - 1.0 K/uL   Eosinophils Relative 2 %   Eosinophils Absolute 0.2 0.0 - 0.5 K/uL   Basophils Relative 0 %   Basophils Absolute 0.0 0.0 - 0.1 K/uL   Immature Granulocytes 0 %   Abs Immature Granulocytes 0.03 0.00 - 0.07 K/uL    Comment: Performed at [facility], [address] [place], [place] [address]  Basic metabolic panel     Status: None   Collection Time: 04/04/19  6:51 PM  Result Value Ref Range   Sodium 140 135 - 145 mmol/L   Potassium 3.9 3.5 - 5.1 mmol/L  Chloride 105 98 - 111 mmol/L   CO2 24 22 - 32 mmol/L   Glucose, Bld 95 70 - 99 mg/dL   BUN 9 6 - 20 mg/dL   Creatinine, Ser 7.09 0.44 - 1.00 mg/dL   Calcium 9.2 8.9 - 62.8 mg/dL   GFR calc non Af Amer >60 >60 mL/min   GFR calc Af Amer >60 >60 mL/min   Anion gap 11 5 - 15    Comment: Performed at [facility], [address], [place], [place] [address]  Lactic acid, plasma     Status: None   Collection Time: 04/04/19  6:51 PM  Result Value Ref Range   Lactic Acid, Venous 1.1 0.5 - 1.9 mmol/L    Comment: Performed at [facility], [address] [place], [place] [address]  I-Stat Beta hCG blood, ED ([facility], [facility], [facility] only)     Status: None   Collection Time: 04/04/19  7:19 PM  Result Value Ref Range   I-stat hCG, quantitative <5.0 <5 mIU/mL   Comment 3            Comment:   GEST. AGE      CONC.  (mIU/mL)   <=1 WEEK        5 - 50     2  WEEKS       50 - 500     3 WEEKS       100 - 10,000     4 WEEKS     1,000 - 30,000        FEMALE AND NON-PREGNANT FEMALE:     LESS THAN 5 mIU/mL    Dg Tibia/fibula Left  Result Date: 04/04/2019 CLINICAL DATA:  Skin wound on the anterior aspect of the left lower leg due to IV drug abuse. EXAM: LEFT TIBIA AND FIBULA - 2 VIEW COMPARISON:  Plain films left lower leg 03/29/2019. FINDINGS: There is infiltration of subcutaneous fat throughout the lower leg. No radiopaque foreign body or soft tissue gas. No bony abnormality. IMPRESSION: Infiltration of subcutaneous fat could be due to dependent change or cellulitis. The exam is otherwise negative. No change compared to the prior study. Electronically Signed   By: Drusilla Kanner M.D.   On: 04/04/2019 16:32    Pending Labs Unresulted Labs (From admission, onward)    Start     Ordered   04/05/19 0500  CBC  Tomorrow morning,   R     04/04/19 2042   04/05/19 0500  Basic metabolic panel  Tomorrow morning,   R     04/04/19 2042   04/04/19 2040  SARS CORONAVIRUS 2 (TAT 6-24 HRS)  Once,   STAT     04/04/19 2040   04/04/19 1702  Wound or Superficial Culture  Once,   STAT    Question:  Patient immune status  Answer:  Normal   04/04/19 1702   04/04/19 1605  Lactic acid, plasma  Now then every 2 hours,   STAT     04/04/19 1604   04/04/19 1605  Blood culture (routine x 2)  BLOOD CULTURE X 2,   STAT     04/04/19 1604          Vitals/Pain Today's Vitals   04/04/19 1518 04/04/19 1519 04/04/19 1603  BP:  (!) 115/93   Pulse:  96   Resp:  18   Temp:  98.4 F (36.9 C)   SpO2:  100%   Weight:   79.4  kg  Height:   5\' 6"  (1.676 m)  PainSc: 8       Isolation Precautions No active isolations  Medications Medications  sodium chloride 0.9 % bolus 1,000 mL (has no administration in time range)  Tdap (BOOSTRIX) injection 0.5 mL (has no administration in time range)  enoxaparin (LOVENOX) injection 40 mg (has no administration in time range)  sodium  chloride flush (NS) 0.9 % injection 3 mL (has no administration in time range)  acetaminophen (TYLENOL) tablet 650 mg (has no administration in time range)    Or  acetaminophen (TYLENOL) suppository 650 mg (has no administration in time range)  ondansetron (ZOFRAN) tablet 4 mg (has no administration in time range)    Or  ondansetron (ZOFRAN) injection 4 mg (has no administration in time range)  HYDROmorphone (DILAUDID) injection 1 mg (has no administration in time range)  vancomycin (VANCOCIN) 1,250 mg in sodium chloride 0.9 % 250 mL IVPB (has no administration in time range)  lidocaine (PF) (XYLOCAINE) 1 % injection 5 mL (5 mLs Infiltration Given 04/04/19 1929)  morphine 4 MG/ML injection 4 mg (4 mg Intravenous Given 04/04/19 1859)    Mobility walks Low fall risk   Focused Assessments Cardiac Assessment Handoff:    No results found for: CKTOTAL, CKMB, CKMBINDEX, TROPONINI No results found for: DDIMER Does the Patient currently have chest pain? No      R Recommendations: See Admitting Provider Note  Report given to:   Additional Notes:

## 2019-04-04 NOTE — H&P (Signed)
Date: 04/04/2019               Patient Name:  Latoya DecampHeather Hulbert MRN: [id number]  DOB: 1986-03-18 Age / Sex: 33 y.o., female   PCP: Patient, No Pcp Per         Medical Service: Internal Medicine Teaching Service         Attending Physician: Dr. Oswaldo DoneVincent, Marquita Palmsuncan Thomas, *    First Contact: Dr. Mcarthur RossettiAslam  Pager: [phone]  Second Contact: Dr. Dortha SchwalbeAgyei  Pager: [phone]       After Hours (After 5p/  First Contact Pager: [phone]  weekends / holidays): Second Contact Pager: [phone]   Chief Complaint: Lower Leg Swelling   History of Present Illness: Latoya [name] is a 33 y.o. female with a pertinent PMH of bacterial endocarditis, MSSA bacteremia, anemia, opioid-use disorder, insomnia who presented to [facility] ED with left lower extremity redness and swelling. She denies any other associated symptoms, including fevers, chills, shortness of breath, Patient states that she has a "leg infection from IV drug use." Patient was recently seen in the ED on 11/22 for the same reason and was discharged with a 10 day course of clindamycin.See media for images form ED visit. Patient admits to improvement when starting the AB, but then subsequently got worse. She admits to taking the medication as prescribed. Patient admits to IV heroin use. She uses up to 2 grams/day. Her last time using heroin was this morning. She does admit to injecting in both of her legs. She was recently admitted to [facility] on 01/2019 for MSSA bacterial endocarditis with angiovac removal of large vegetation on the tricuspid valve. She completed 6 weeks of IV Ancef at [facility]. During the hospital course the patient had an acute drop in Hgb that was concerning for GI bleed. EGD and Colonoscopy were performed with visualization of the source of bleeding. Colonoscopy was limited due to inadequate prep and patient was told to follow up with GI in outpatient setting. Repeat Echo after 6 weeks of antibiotics showed severe  tricuspid regurgitation and was set up with a cardiology follow up as an out patient. Patient was also found to have a new dx of HCV and was set up with ID  For follow up. Patient states that she has not yet made appointments with these providers.    Social:  IVDU - heroin, 2 grams per day,  Tobacco use - cigarettes 5-10+ pack/years   Social History   Socioeconomic History   Marital status: Unknown    Spouse name: Not on file   Number of children: Not on file   Years of education: Not on file   Highest education level: Not on file  Occupational History   Not on file  Social Needs   Financial resource strain: Not on file   Food insecurity    Worry: Not on file    Inability: Not on file   Transportation needs    Medical: Not on file    Non-medical: Not on file  Tobacco Use   Smoking status: Not on file  Substance and Sexual Activity   Alcohol use: Not on file   Drug use: Not on file   Sexual activity: Not on file  Lifestyle   Physical activity    Days per week: Not on file    Minutes per session: Not on file   Stress: Not on file  Relationships   Social connections    Talks on  phone: Not on file    Gets together: Not on file    Attends religious service: Not on file    Active member of club or organization: Not on file    Attends meetings of clubs or organizations: Not on file    Relationship status: Not on file   Intimate partner violence    Fear of current or ex partner: Not on file    Emotionally abused: Not on file    Physically abused: Not on file    Forced sexual activity: Not on file  Other Topics Concern   Not on file  Social History Narrative   Not on file     Family History:  No family history on file.  Meds:  Current Meds  Medication Sig   clindamycin (CLEOCIN) 300 MG capsule Take 2 capsules (600 mg total) by mouth 3 (three) times daily for 10 days.   ferrous sulfate 325 (65 FE) MG tablet Take 325 mg by mouth daily with  breakfast.   mirtazapine (REMERON) 15 MG tablet Take 15 mg by mouth at bedtime.   traZODone (DESYREL) 50 MG tablet Take 50-100 mg by mouth at bedtime.     Allergies: Allergies as of 04/04/2019 - Review Complete 04/04/2019  Allergen Reaction Noted   Naltrexone Anxiety 02/20/2019   Past Medical History:  Diagnosis Date   IV drug abuse (HCC)      Review of Systems: A complete ROS was negative except as per HPI.   Physical Exam: Blood pressure (!) 115/93, pulse 96, temperature 98.4 F (36.9 C), resp. rate 18, height 5\' 6"  (1.676 m), weight 79.4 kg, SpO2 100 %. Physical Exam  Constitutional: She is oriented to person, place, and time. No distress.  HENT:  Head: Normocephalic and atraumatic.  Eyes: EOM are normal.  Neck: Normal range of motion.  Cardiovascular: Normal rate and regular rhythm. Exam reveals no gallop and no friction rub.  Murmur heard. Pulmonary/Chest: Effort normal and breath sounds normal. No respiratory distress. She exhibits no tenderness.  Abdominal: Soft. She exhibits no distension. There is no abdominal tenderness.  Musculoskeletal: Normal range of motion.        General: Edema present.  Neurological: She is alert and oriented to person, place, and time.  Skin: Skin is warm and dry. She is not diaphoretic. There is erythema (left lower extremity).  LLE - see media for photos. At least 2 excoriations on left lower extremity due to needle.    EKG: personally reviewed my interpretation is pending   CXR: personally reviewed my interpretation is not performed   DG Tibia/Fibular Left: Infiltration of subcutaneous fat could be due to dependent change or cellulitis.  Assessment & Plan by Problem: Active Problems:   Cellulitis and abscess of right leg  Ms. Mcelreath is a 33 y.o. female with a pertinent PMH of opioid-use disorder and bacterial endocarditis who presents with signs and symptoms concerning for left lower extremity cellulitis with purulent  discharge. Patient was started on Vancomycin and bed-slide I&D was attempted in the ED.  Left Lower Extremity Cellulitis with Abscess/Puruelent Drainage  - 2x blood cultures - No leukocytosis or lactic acidemia  - IV vancomycin started - 1000 cc NS given in ED - Will likely need Ortho consult for I&D  Opioid-Use Disorder: - Patient started on Dilaudid 1 mg q 4 hours prn. Titrate as needed.  - Zofran for nausea   Normocytic Anemia: - Hgb of 11. 9, stable since previous ED visit - Previous history  of upper GI bleed.  Hepatitis C Virus  - Has not had treatment    Diet: Normal VTE: Enoxaparin  IVF: NS  Code: Full Code   Dispo: Admit patient to Inpatient with expected length of stay greater than 2 midnights.  Signed: Marianna Payment, MD 04/04/2019, 8:59 PM  Pager: [phone]

## 2019-04-04 NOTE — ED Provider Notes (Signed)
[facility] EMERGENCY DEPARTMENT Provider Note   CSN: [id number] Arrival date & time: 04/04/19  1515    History   Chief Complaint Chief Complaint  Patient presents with  . Abscess    HPI Diego Delancey is a 33 y.o. female with past medical history significant for IVDU, ring use, bacterial endocarditis, MSSA bacteremia, angiovac Sept 2020 septic emboli who presents for evaluation of left leg redness.  Was seen in the emergency department approximately 6 days ago and started on antibiotics.  Has been compliant with her medications. She did previously admit to injecting her LL extremity.  Patient denies recent heroin injection.  Patient states she has been compliant with her clindamycin at home.  Denies fever, chills, nausea, vomiting, chest pain, shortness of breath, abdominal pain, diarrhea, dysuria, decreased range of motion.  She admits to redness, swelling, warmth to her left lower extremity with purulent drainage from left mid shin.  She has been ambulatory however with increased pain.  No prior history of PE, DVT, no recent anticoagulation.  She did have recent surgery approximately 2 months ago.  She had negative ultrasound for DVT approximately 6 days ago.  Denies additional aggravating or alleviating factors.  History obtained from patient and past medical records.  No interpreter was used.     HPI  Past Medical History:  Diagnosis Date  . IV drug abuse [facility])     Patient Active Problem List   Diagnosis Date Noted  . Cellulitis and abscess of right leg 04/04/2019   History reviewed   OB History   No obstetric history on file.      Home Medications    Prior to Admission medications   Medication Sig Start Date End Date Taking? Authorizing Provider  clindamycin (CLEOCIN) 300 MG capsule Take 2 capsules (600 mg total) by mouth 3 (three) times daily for 10 days. 03/29/19 04/08/19 Yes Liberty Handy, PA-C  ferrous sulfate 325 (65 FE) MG tablet Take  325 mg by mouth daily with breakfast. 02/24/19  Yes [provider]  mirtazapine (REMERON) 15 MG tablet Take 15 mg by mouth at bedtime.   Yes [provider]  traZODone (DESYREL) 50 MG tablet Take 50-100 mg by mouth at bedtime.   Yes [provider]    Family History No family history on file.  Social History Social History   Tobacco Use  . Smoking status: Not on file  Substance Use Topics  . Alcohol use: Not on file  . Drug use: Not on file     Allergies   Naltrexone   Review of Systems Review of Systems  Constitutional: Negative.   HENT: Negative.   Respiratory: Negative.   Cardiovascular: Negative.   Gastrointestinal: Negative.   Genitourinary: Negative.   Musculoskeletal: Negative.   Skin: Positive for wound.  Neurological: Negative.   All other systems reviewed and are negative.    Physical Exam Updated Vital Signs BP (!) 115/93   Pulse 96   Temp 98.4 F (36.9 C)   Resp 18   Ht 5\' 6"  (1.676 m)   Wt 79.4 kg   SpO2 100%   BMI 28.25 kg/m   Physical Exam Vitals signs and nursing note reviewed.  Constitutional:      General: She is not in acute distress.    Appearance: She is well-developed. She is not ill-appearing or toxic-appearing.  HENT:     Head: Normocephalic and atraumatic.  Eyes:     Pupils: Pupils are equal, round,  and reactive to light.  Neck:     Musculoskeletal: Normal range of motion.  Cardiovascular:     Rate and Rhythm: Normal rate.  Pulmonary:     Effort: No respiratory distress.  Abdominal:     General: There is no distension.  Musculoskeletal: Normal range of motion.     Comments: Soft tissue tenderness along her mid left anterior tibia and fibula.  No crepitus, no bony tenderness.  Compartment soft.  Range of motion bilateral knees and ankles.  No pain with range of motion to knees or ankles, no obvious bony fusion.  Skin:    General: Skin is warm and dry.     Comments: Large area of erythema which  extends from ankle to just below patient's patella.  There is fluctuance and induration to mid shaft of left tibia/fibula.  There is purulent drainage from punctate wound.  There is also healing wound to left medial malleolus.  Brisk capillary refill.  See picture in chart.  Neurological:     Mental Status: She is alert.       ED Treatments / Results  Labs (all labs ordered are listed, but only abnormal results are displayed) Labs Reviewed  CBC WITH DIFFERENTIAL/PLATELET - Abnormal; Notable for the following components:      Result Value   Hemoglobin 11.9 (*)    MCH 24.4 (*)    MCHC 29.9 (*)    RDW 16.5 (*)    Platelets 404 (*)    All other components within normal limits  CULTURE, BLOOD (ROUTINE X 2)  CULTURE, BLOOD (ROUTINE X 2)  AEROBIC CULTURE (SUPERFICIAL SPECIMEN)  SARS CORONAVIRUS 2 (TAT 6-24 HRS)  BASIC METABOLIC PANEL  LACTIC ACID, PLASMA  LACTIC ACID, PLASMA  CBC  BASIC METABOLIC PANEL  I-STAT BETA HCG BLOOD, ED ([facility], [facility], [facility] ONLY)    EKG None  Radiology Dg Tibia/fibula Left  Result Date: 04/04/2019 CLINICAL DATA:  Skin wound on the anterior aspect of the left lower leg due to IV drug abuse. EXAM: LEFT TIBIA AND FIBULA - 2 VIEW COMPARISON:  Plain films left lower leg 03/29/2019. FINDINGS: There is infiltration of subcutaneous fat throughout the lower leg. No radiopaque foreign body or soft tissue gas. No bony abnormality. IMPRESSION: Infiltration of subcutaneous fat could be due to dependent change or cellulitis. The exam is otherwise negative. No change compared to the prior study. Electronically Signed   By: Drusilla Kannerhomas  Dalessio M.D.   On: 04/04/2019 16:32    Procedures .Marland Kitchen.Incision and Drainage  Date/Time: 04/04/2019 7:59 PM Performed by: Linwood DibblesHenderly, Ronniesha Seibold A, PA-C Authorized by: Linwood DibblesHenderly, Bristol Soy A, PA-C   Consent:    Consent obtained:  Verbal   Consent given by:  Patient   Risks discussed:  Bleeding, incomplete drainage, pain, damage to other organs and  infection   Alternatives discussed:  No treatment, delayed treatment, alternative treatment, observation and referral Universal protocol:    Procedure explained and questions answered to patient or proxy's satisfaction: yes     Relevant documents present and verified: yes     Test results available and properly labeled: yes     Imaging studies available: yes     Required blood products, implants, devices, and special equipment available: yes     Site/side marked: yes     Immediately prior to procedure a time out was called: yes     Patient identity confirmed:  Verbally with patient Location:    Type:  Abscess   Size:  4cm  Location:  Lower extremity   Lower extremity location:  Leg   Leg location:  L lower leg Pre-procedure details:    Skin preparation:  Betadine Anesthesia (see MAR for exact dosages):    Anesthesia method:  Local infiltration   Local anesthetic:  Lidocaine 1% WITH epi Procedure type:    Complexity:  Complex Procedure details:    Incision types:  Single straight   Incision depth:  Subcutaneous   Scalpel blade:  11   Wound management:  Probed and deloculated, irrigated with saline and extensive cleaning   Drainage:  Purulent   Drainage amount:  Moderate   Wound treatment:  Wound left open   Packing materials:  None Post-procedure details:    Patient tolerance of procedure:  Procedure terminated at patient's request   (including critical care time)  Medications Ordered in ED Medications  sodium chloride 0.9 % bolus 1,000 mL (has no administration in time range)  Tdap (BOOSTRIX) injection 0.5 mL (has no administration in time range)  enoxaparin (LOVENOX) injection 40 mg (has no administration in time range)  sodium chloride flush (NS) 0.9 % injection 3 mL (has no administration in time range)  acetaminophen (TYLENOL) tablet 650 mg (has no administration in time range)    Or  acetaminophen (TYLENOL) suppository 650 mg (has no administration in time range)   ondansetron (ZOFRAN) tablet 4 mg (has no administration in time range)    Or  ondansetron (ZOFRAN) injection 4 mg (has no administration in time range)  HYDROmorphone (DILAUDID) injection 1 mg (has no administration in time range)  vancomycin (VANCOCIN) 1,250 mg in sodium chloride 0.9 % 250 mL IVPB (has no administration in time range)  lidocaine (PF) (XYLOCAINE) 1 % injection 5 mL (5 mLs Infiltration Given 04/04/19 1929)  morphine 4 MG/ML injection 4 mg (4 mg Intravenous Given 04/04/19 1859)    Initial Impression / Assessment and Plan / ED Course  I have reviewed the triage vital signs and the nursing notes.  Pertinent labs & imaging results that were available during my care of the patient were reviewed by me and considered in my medical decision making (see chart for details).  33 year old female history of IV drug use presents for evaluation of abscess and cellulitis to left lower extremity.  Has been on clindamycin x6 days with worsening of her cellulitis. She denies any systemic symptoms. Large area of cellulitis and superficial abscess.  Cellulitis does not appear to have improved and appears to have actually worsened since she was last seen in the emergency department according to picture in chart.  She would benefit from drainage of abscess and admission for IV antibiotics.  Will also obtain wound culture.  1845: Unfortunately patient with hard stick.  Multiple nurses have tried multiple IV attempts.  Fluids and antibiotics were delayed as well as labs due to difficult IV access  1920: Labs obtained. IV will no flush so further delay in ABX, IVF. Abscess drained however patient is not tolerate well. Patient requested to stop procedure. I was able to get moderate purulent fluid however not able to deloculate or flush wound secondary to patient request for termination. Patient now states that she has to "call someone to see if I can stay the night."  Labs personally reviewed: CBC  without leukocytosis BMP without electrolyte, renal or liver abnormality.  1950: Patient states that she is agreeable for admission for IV antibiotics.  I was able to perform bedside incision and drainage of her abscess however procedure  was terminated due to patient request.  I was able to obtain a culture however is not able to deloculated the wound.  2030: CONSULT with Dr. Tye Savoy resident, supervised by attending Dr. Evette Doffing with Internal medicine teaching service who agrees to evaluate patient for admission for cellulitis with failed outpatient therapy and leg abscess.  The patient appears reasonably stabilized for admission considering the current resources, flow, and capabilities available in the ED at this time, and I doubt any other [facility] requiring further screening and/or treatment in the ED prior to admission.     Final Clinical Impressions(s) / ED Diagnoses   Final diagnoses:  Abscess  Cellulitis of left lower extremity    ED Discharge Orders    None       Tuvia Woodrick A, PA-C 04/04/19 2129    Daleen Bo, MD 04/06/19 1206

## 2019-04-05 ENCOUNTER — Encounter (HOSPITAL_COMMUNITY): Payer: Self-pay

## 2019-04-05 DIAGNOSIS — R768 Other specified abnormal immunological findings in serum: Secondary | ICD-10-CM | POA: Diagnosis present

## 2019-04-05 DIAGNOSIS — F1190 Opioid use, unspecified, uncomplicated: Secondary | ICD-10-CM | POA: Diagnosis present

## 2019-04-05 DIAGNOSIS — F1199 Opioid use, unspecified with unspecified opioid-induced disorder: Secondary | ICD-10-CM | POA: Diagnosis present

## 2019-04-05 HISTORY — DX: Opioid use, unspecified, uncomplicated: F11.90

## 2019-04-05 HISTORY — DX: Other specified abnormal immunological findings in serum: R76.8

## 2019-04-05 LAB — CBC
HCT: 33.7 % — ABNORMAL LOW (ref 36.0–46.0)
Hemoglobin: 10.4 g/dL — ABNORMAL LOW (ref 12.0–15.0)
MCH: 24.7 pg — ABNORMAL LOW (ref 26.0–34.0)
MCHC: 30.9 g/dL (ref 30.0–36.0)
MCV: 80 fL (ref 80.0–100.0)
Platelets: 344 10*3/uL (ref 150–400)
RBC: 4.21 MIL/uL (ref 3.87–5.11)
RDW: 16.5 % — ABNORMAL HIGH (ref 11.5–15.5)
WBC: 7.4 10*3/uL (ref 4.0–10.5)
nRBC: 0 % (ref 0.0–0.2)

## 2019-04-05 LAB — BASIC METABOLIC PANEL
Anion gap: 9 (ref 5–15)
BUN: 9 mg/dL (ref 6–20)
CO2: 25 mmol/L (ref 22–32)
Calcium: 8.5 mg/dL — ABNORMAL LOW (ref 8.9–10.3)
Chloride: 107 mmol/L (ref 98–111)
Creatinine, Ser: 0.51 mg/dL (ref 0.44–1.00)
GFR calc Af Amer: >60 mL/min (ref >60)
GFR calc non Af Amer: >60 mL/min (ref >60)
Glucose, Bld: 94 mg/dL (ref 70–99)
Potassium: 3.8 mmol/L (ref 3.5–5.1)
Sodium: 141 mmol/L (ref 135–145)

## 2019-04-05 LAB — SARS CORONAVIRUS 2 (TAT 6-24 HRS): SARS Coronavirus 2: NEGATIVE

## 2019-04-05 MED ORDER — TRAZODONE HCL 50 MG PO TABS
50.0000 mg | ORAL_TABLET | Freq: Every day | ORAL | Status: DC
  Administered 2019-04-05: 100 mg via ORAL
  Administered 2019-04-06: 50 mg via ORAL
  Filled 2019-04-05: qty 2
  Filled 2019-04-05: qty 1

## 2019-04-05 MED ORDER — HYDROMORPHONE HCL 1 MG/ML IJ SOLN
1.0000 mg | Freq: Once | INTRAMUSCULAR | Status: AC
  Administered 2019-04-05: 1 mg via INTRAVENOUS
  Filled 2019-04-05: qty 1

## 2019-04-05 MED ORDER — MIRTAZAPINE 15 MG PO TABS
15.0000 mg | ORAL_TABLET | Freq: Every day | ORAL | Status: DC
  Administered 2019-04-05 – 2019-04-06 (×2): 15 mg via ORAL
  Filled 2019-04-05 (×2): qty 1

## 2019-04-05 MED ORDER — FERROUS SULFATE 325 (65 FE) MG PO TABS
325.0000 mg | ORAL_TABLET | Freq: Every day | ORAL | Status: DC
  Administered 2019-04-05 – 2019-04-07 (×3): 325 mg via ORAL
  Filled 2019-04-05 (×3): qty 1

## 2019-04-05 MED ORDER — PANTOPRAZOLE SODIUM 20 MG PO TBEC
20.0000 mg | DELAYED_RELEASE_TABLET | Freq: Every day | ORAL | Status: DC
  Administered 2019-04-05 – 2019-04-07 (×3): 20 mg via ORAL
  Filled 2019-04-05 (×3): qty 1

## 2019-04-05 MED ORDER — HYDROMORPHONE HCL 2 MG/ML IJ SOLN
1.5000 mg | INTRAMUSCULAR | Status: DC | PRN
  Administered 2019-04-05 – 2019-04-06 (×5): 1.5 mg via INTRAVENOUS
  Filled 2019-04-05 (×5): qty 1

## 2019-04-05 NOTE — Plan of Care (Signed)
?  Problem: Clinical Measurements: ?Goal: Ability to avoid or minimize complications of infection will improve ?Outcome: Progressing ?  ?Problem: Skin Integrity: ?Goal: Skin integrity will improve ?Outcome: Progressing ?  ?

## 2019-04-05 NOTE — Plan of Care (Signed)
  Problem: Clinical Measurements: Goal: Ability to avoid or minimize complications of infection will improve 04/05/2019 0137 by Junius Roads, RN Outcome: Progressing 04/05/2019 0136 by Junius Roads, RN Outcome: Progressing   Problem: Clinical Measurements: Goal: Ability to maintain clinical measurements within normal limits will improve Outcome: Progressing   Problem: Coping: Goal: Level of anxiety will decrease Outcome: Progressing

## 2019-04-05 NOTE — Progress Notes (Signed)
   Subjective: HD#1 Overnight, no acute events. [name] was examined at bedside this morning. She feels like she is withdrawing and feels hot and diaphoretic. She reports continued pain in her left lower extremity.   Objective:  Vital signs in last 24 hours: Vitals:   04/04/19 1519 04/04/19 1603 04/04/19 2240 04/05/19 0340  BP: (!) 115/93  127/83 117/84  Pulse: 96  90 94  Resp: 18  16 14   Temp: 98.4 F (36.9 C)  97.8 F (36.6 C) 99 F (37.2 C)  TempSrc:   Oral Oral  SpO2: 100%  100% 99%  Weight:  79.4 kg    Height:  5\' 6"  (1.676 m)     Physical Exam Vitals signs reviewed.  Constitutional:      General: She is in acute distress.     Appearance: She is not ill-appearing, toxic-appearing or diaphoretic.  Skin:    General: Skin is warm and dry.     Comments: LLE: 3-4cm abscess noted at mid-tibia with bloody, nonpurulent drainage without significant surrounding edema. Tenderness to palpation and surrounding warmth noted.   Neurological:     General: No focal deficit present.     Mental Status: She is alert and oriented to person, place, and time.    Assessment/Plan:  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    Left lower extremity cellulitis w/abscess: Latoya [name] initially presented with cellulitis of left leg on 11/22 and was discharged with clindamycin. However, patient continued to have pain, redness and swelling in LLE and presented yesterday with abscess formation on tibia of LLE. She had attempted I&D in the ED but was unable to tolerate it further due to pain. Wound left open without packing. This morning, the wound still appears swollen and is tender to palpation. It appears closed without much  spontaneous drainage. Suspect there is purulence that may require surgical drainage. Patient is afebrile and VSS without significant leukocytosis.  - Orthopedics consulted, appreciate recs - Continue IV vancomycin q12h day 2 - f/u wound cultures  - f/u blood cultures  - CBC daily   Opioid use disorder: Patient with history of IV heroin use for 3 years. She has previously tried suboxone, methadone and inpatient rehab without significant success. She reports reusing her needles and does not share needles. She reports mostly injecting into her legs as she is right handed and does not want to inject into her arms. She reports active withdrawal as she is feeling hot and sweaty. Patient offered suboxone or methadone but she is not interested at the moment.  - Pain control with dilaudid 1.5mg  q4h prn - Acetaminophen 650mg  q6h prn - May add on methadone 10mg  q8h and titrate as needed for pain control   Normocytic anemia w/ Hx of GI bleed: Hb 10.4, MCV 80. This is stable and patient does not have any signs of bleeding.  - Protonix 20mg  qd - Ferrous sulfate 325mg  qAM - CBC daily   FEN: Regular diet Code: FULL VTE Prophylaxis: Lovenox   Dispo: Anticipated discharge pending clinical improvement.   Harvie Heck, MD  Internal Medicine, PGY-1 04/05/2019, 9:35 AM Pager: [phone]

## 2019-04-05 NOTE — Plan of Care (Signed)
Pt requesting pain med every hour despite being told when it is next due. Stated she needed it R/T detox symptoms.   Problem: Clinical Measurements: Goal: Ability to avoid or minimize complications of infection will improve Outcome: Progressing   Problem: Pain Managment: Goal: General experience of comfort will improve Outcome: Progressing   Problem: Safety: Goal: Ability to remain free from injury will improve Outcome: Progressing

## 2019-04-05 NOTE — Consult Note (Signed)
Orthopaedic Trauma Service Consultation  Reason for Consult: Left leg abscess Referring Physician: Jearld Fentonslam, Latoya [name]  Latoya [name] is an 33 y.o. female.  HPI: Polysubstance abuser with history of injection related infections has several day h/o of increasing redness, tenderness, and drainage from the left leg. Underwent bedside incision in the ED and admitted for IV abx. She is interested in quitting but confesses no awareness of how to begin.  Past Medical History:  Diagnosis Date  . IV drug abuse (HCC)     History reviewed. No pertinent surgical history.  History reviewed. No pertinent family history.  Social History:  reports that she has been smoking. She has never used smokeless tobacco. She reports current alcohol use. No history on file for drug.  Allergies:  Allergies  Allergen Reactions  . Naltrexone Anxiety    Severe anxiety, restlessness, vomiting    Medications:  Prior to Admission:  Medications Prior to Admission  Medication Sig Dispense Refill Last Dose  . clindamycin (CLEOCIN) 300 MG capsule Take 2 capsules (600 mg total) by mouth 3 (three) times daily for 10 days. 60 capsule 0 04/04/2019 at Unknown time  . ferrous sulfate 325 (65 FE) MG tablet Take 325 mg by mouth daily with breakfast.   04/02/2019  . mirtazapine (REMERON) 15 MG tablet Take 15 mg by mouth at bedtime.   Past Week at Unknown time  . traZODone (DESYREL) 50 MG tablet Take 50-100 mg by mouth at bedtime.   Past Week at Unknown time    Results for orders placed or performed during the hospital encounter of 04/04/19 (from the past 48 hour(s))  Blood culture (routine x 2)     Status: None (Preliminary result)   Collection Time: 04/04/19  5:40 PM   Specimen: BLOOD RIGHT ARM  Result Value Ref Range   Specimen Description BLOOD RIGHT ARM    Special Requests      BOTTLES DRAWN AEROBIC AND ANAEROBIC Blood Culture results may not be optimal due to an inadequate volume of blood received in culture bottles    Culture      NO GROWTH < 24 HOURS Performed at [facility], [address] [place], [place] [address]    Report Status PENDING   Blood culture (routine x 2)     Status: None (Preliminary result)   Collection Time: 04/04/19  5:40 PM   Specimen: BLOOD RIGHT ARM  Result Value Ref Range   Specimen Description BLOOD RIGHT ARM    Special Requests      BOTTLES DRAWN AEROBIC AND ANAEROBIC Blood Culture results may not be optimal due to an inadequate volume of blood received in culture bottles   Culture      NO GROWTH < 24 HOURS Performed at [facility], [address] [place], [place] [address]    Report Status PENDING   CBC with Differential     Status: Abnormal   Collection Time: 04/04/19  6:51 PM  Result Value Ref Range   WBC 7.7 4.0 - 10.5 K/uL   RBC 4.88 3.87 - 5.11 MIL/uL   Hemoglobin 11.9 (L) 12.0 - 15.0 g/dL   HCT 09.839.8 11.936.0 - 14.746.0 %   MCV 81.6 80.0 - 100.0 fL   MCH 24.4 (L) 26.0 - 34.0 pg   MCHC 29.9 (L) 30.0 - 36.0 g/dL   RDW 82.916.5 (H) 56.211.5 - 13.015.5 %   Platelets 404 (H) 150 - 400 K/uL   nRBC 0.0 0.0 - 0.2 %  Neutrophils Relative % 57 %   Neutro Abs 4.4 1.7 - 7.7 K/uL   Lymphocytes Relative 34 %   Lymphs Abs 2.6 0.7 - 4.0 K/uL   Monocytes Relative 7 %   Monocytes Absolute 0.5 0.1 - 1.0 K/uL   Eosinophils Relative 2 %   Eosinophils Absolute 0.2 0.0 - 0.5 K/uL   Basophils Relative 0 %   Basophils Absolute 0.0 0.0 - 0.1 K/uL   Immature Granulocytes 0 %   Abs Immature Granulocytes 0.03 0.00 - 0.07 K/uL    Comment: Performed at [facility] [address], [place], [place] [address]  Basic metabolic panel     Status: None   Collection Time: 04/04/19  6:51 PM  Result Value Ref Range   Sodium 140 135 - 145 mmol/L   Potassium 3.9 3.5 - 5.1 mmol/L   Chloride 105 98 - 111 mmol/L   CO2 24 22 - 32 mmol/L   Glucose, Bld 95 70 - 99 mg/dL   BUN 9 6 - 20 mg/dL   Creatinine, Ser 0.54 0.44 - 1.00 mg/dL   Calcium 9.2 8.9 - 10.3 mg/dL   GFR calc non Af Amer >60  >60 mL/min   GFR calc Af Amer >60 >60 mL/min   Anion gap 11 5 - 15    Comment: Performed at [facility] [address] [place], [place] [address]  Lactic acid, plasma     Status: None   Collection Time: 04/04/19  6:51 PM  Result Value Ref Range   Lactic Acid, Venous 1.1 0.5 - 1.9 mmol/L    Comment: Performed at [facility] [address] [place], [place] [address]  I-Stat Beta hCG blood, ED ([facility], [facility], [facility] only)     Status: None   Collection Time: 04/04/19  7:19 PM  Result Value Ref Range   I-stat hCG, quantitative <5.0 <5 mIU/mL   Comment 3            Comment:   GEST. AGE      CONC.  (mIU/mL)   <=1 WEEK        5 - 50     2 WEEKS       50 - 500     3 WEEKS       100 - 10,000     4 WEEKS     1,000 - 30,000        FEMALE AND NON-PREGNANT FEMALE:     LESS THAN 5 mIU/mL   Wound or Superficial Culture     Status: None (Preliminary result)   Collection Time: 04/04/19  7:59 PM   Specimen: Wound  Result Value Ref Range   Specimen Description WOUND SITE NOT SPECIFIED    Special Requests NONE    Gram Stain PENDING    Culture      TOO YOUNG TO READ Performed at [facility], [place] [address], [place], [place] [address]    Report Status PENDING   SARS CORONAVIRUS 2 (TAT 6-24 HRS)     Status: None   Collection Time: 04/04/19  9:50 PM  Result Value Ref Range   SARS Coronavirus 2 NEGATIVE NEGATIVE    Comment: (NOTE) SARS-CoV-2 target nucleic acids are NOT DETECTED. The SARS-CoV-2 RNA is generally detectable in upper and lower respiratory specimens during the acute phase of infection. Negative results do not preclude SARS-CoV-2 infection, do not rule out co-infections with other pathogens, and should not be used as  the sole basis for treatment or other patient management decisions. Negative results must be combined with clinical observations, patient history, and epidemiological [email or web address]. Fact Sheet for  Patients: https://www.fda.gov/media/138098/download Fact Sheet for Healthcare Providers: quierodirigir.com This test is not yet approved or cleared by the [place] FDA and  has been authorized for detection and/or diagnosis of SARS-CoV-2 by FDA under an Emergency Use Authorization (EUA). This EUA will remain  in effect (meaning this test can be used) for the duration of the COVID-19 declaration under Section 56 4(b)(1) of the Act, 21 U.S.C. section 360bbb-3(b)(1), unless the authorization is terminated or revoked sooner. Performed at [facility], [address] [place], [place] [address]   CBC     Status: Abnormal   Collection Time: 04/05/19  3:56 AM  Result Value Ref Range   WBC 7.4 4.0 - 10.5 K/uL   RBC 4.21 3.87 - 5.11 MIL/uL   Hemoglobin 10.4 (L) 12.0 - 15.0 g/dL   HCT 60.4 (L) 54.0 - 98.1 %   MCV 80.0 80.0 - 100.0 fL   MCH 24.7 (L) 26.0 - 34.0 pg   MCHC 30.9 30.0 - 36.0 g/dL   RDW 19.1 (H) 47.8 - 29.5 %   Platelets 344 150 - 400 K/uL   nRBC 0.0 0.0 - 0.2 %    Comment: Performed at [facility], [address], [place], [place] [address]  Basic metabolic panel     Status: Abnormal   Collection Time: 04/05/19  3:56 AM  Result Value Ref Range   Sodium 141 135 - 145 mmol/L   Potassium 3.8 3.5 - 5.1 mmol/L   Chloride 107 98 - 111 mmol/L   CO2 25 22 - 32 mmol/L   Glucose, Bld 94 70 - 99 mg/dL   BUN 9 6 - 20 mg/dL   Creatinine, Ser 8.65 0.44 - 1.00 mg/dL   Calcium 8.5 (L) 8.9 - 10.3 mg/dL   GFR calc non Af Amer >60 >60 mL/min   GFR calc Af Amer >60 >60 mL/min   Anion gap 9 5 - 15    Comment: Performed at [facility], [address], [place], [place] [address]    Dg Tibia/fibula Left  Result Date: 04/04/2019 CLINICAL DATA:  Skin wound on the anterior aspect of the left lower leg due to IV drug abuse. EXAM: LEFT TIBIA AND FIBULA - 2 VIEW COMPARISON:  Plain films left lower leg 03/29/2019. FINDINGS: There is infiltration of  subcutaneous fat throughout the lower leg. No radiopaque foreign body or soft tissue gas. No bony abnormality. IMPRESSION: Infiltration of subcutaneous fat could be due to dependent change or cellulitis. The exam is otherwise negative. No change compared to the prior study. Electronically Signed   By: Drusilla Kanner M.D.   On: 04/04/2019 16:32    ROS No recent fever, bleeding abnormalities, urologic dysfunction, GI problems, or weight gain.  Blood pressure (!) 123/94, pulse 76, temperature 99.1 F (37.3 C), temperature source Oral, resp. rate 14, height  (1.676 m), weight 79.4 kg, SpO2 100 %. Physical Exam NCAT, AT LLE   Dressing with purulent drainage  Erythema has decreased from clinical photo in the chart  Edema/ swelling controlled  Sens: DPN, SPN, TN intact  Motor: EHL, FHL, and lessor toe ext and flex all intact grossly  Brisk cap refill, warm to touch  Assessment/Plan: Given improvement with bedside incision and drainage and IV abx, and after history, examination,  review of labs and x-rays) I recommend hydrotherapy, cont IV Abx, nonstick dressing changes, possible conversion to WTD at discharge for her left leg abscess.  Patient expressed interest in obtaining leads on Step Programs or other resources to develop a multipronged approach to getting free from addiction. We will check progress tomorrow or Tues.  Myrene Galas, MD [facility], [facility] [phone]  04/05/2019  5:49 PM

## 2019-04-06 DIAGNOSIS — L03116 Cellulitis of left lower limb: Secondary | ICD-10-CM

## 2019-04-06 DIAGNOSIS — R768 Other specified abnormal immunological findings in serum: Secondary | ICD-10-CM

## 2019-04-06 DIAGNOSIS — L02416 Cutaneous abscess of left lower limb: Principal | ICD-10-CM

## 2019-04-06 LAB — AEROBIC CULTURE W GRAM STAIN (SUPERFICIAL SPECIMEN): Gram Stain: NONE SEEN

## 2019-04-06 LAB — COMPREHENSIVE METABOLIC PANEL
ALT: 31 U/L (ref 0–44)
AST: 16 U/L (ref 15–41)
Albumin: 3.1 g/dL — ABNORMAL LOW (ref 3.5–5.0)
Alkaline Phosphatase: 81 U/L (ref 38–126)
Anion gap: 9 (ref 5–15)
BUN: 5 mg/dL — ABNORMAL LOW (ref 6–20)
CO2: 24 mmol/L (ref 22–32)
Calcium: 9 mg/dL (ref 8.9–10.3)
Chloride: 107 mmol/L (ref 98–111)
Creatinine, Ser: 0.6 mg/dL (ref 0.44–1.00)
GFR calc Af Amer: >60 mL/min (ref >60)
GFR calc non Af Amer: >60 mL/min (ref >60)
Glucose, Bld: 102 mg/dL — ABNORMAL HIGH (ref 70–99)
Potassium: 3.5 mmol/L (ref 3.5–5.1)
Sodium: 140 mmol/L (ref 135–145)
Total Bilirubin: 0.4 mg/dL (ref 0.3–1.2)
Total Protein: 7.3 g/dL (ref 6.5–8.1)

## 2019-04-06 LAB — CBC
HCT: 36.7 % (ref 36.0–46.0)
Hemoglobin: 11.4 g/dL — ABNORMAL LOW (ref 12.0–15.0)
MCH: 24.6 pg — ABNORMAL LOW (ref 26.0–34.0)
MCHC: 31.1 g/dL (ref 30.0–36.0)
MCV: 79.1 fL — ABNORMAL LOW (ref 80.0–100.0)
Platelets: 410 10*3/uL — ABNORMAL HIGH (ref 150–400)
RBC: 4.64 MIL/uL (ref 3.87–5.11)
RDW: 16.2 % — ABNORMAL HIGH (ref 11.5–15.5)
WBC: 6.9 10*3/uL (ref 4.0–10.5)
nRBC: 0 % (ref 0.0–0.2)

## 2019-04-06 MED ORDER — METHADONE HCL 10 MG PO TABS: 20.0000 mg | ORAL_TABLET | Freq: Three times a day (TID) | ORAL | Status: DC

## 2019-04-06 MED ORDER — HYDROMORPHONE HCL 1 MG/ML IJ SOLN
1.0000 mg | INTRAMUSCULAR | Status: DC | PRN
  Administered 2019-04-06 – 2019-04-07 (×5): 1 mg via INTRAVENOUS
  Filled 2019-04-06 (×5): qty 1

## 2019-04-06 MED ORDER — METHADONE HCL 10 MG PO TABS
10.0000 mg | ORAL_TABLET | Freq: Three times a day (TID) | ORAL | Status: DC
  Administered 2019-04-06 – 2019-04-07 (×4): 10 mg via ORAL
  Filled 2019-04-06 (×4): qty 1

## 2019-04-06 MED ORDER — CLONIDINE HCL 0.1 MG PO TABS
0.1000 mg | ORAL_TABLET | Freq: Three times a day (TID) | ORAL | Status: AC
  Administered 2019-04-06 – 2019-04-07 (×3): 0.1 mg via ORAL
  Filled 2019-04-06 (×3): qty 1

## 2019-04-06 MED ORDER — OXYCODONE HCL 5 MG PO TABS: 10.0000 mg | ORAL_TABLET | ORAL | Status: DC

## 2019-04-06 NOTE — Progress Notes (Signed)
Patient refused assessment  

## 2019-04-06 NOTE — Progress Notes (Signed)
Patient appears more comfortable.  Appears to sleep.  No distress noted

## 2019-04-06 NOTE — Progress Notes (Addendum)
Subjective: HD#3 Overnight, patient requesting pain medication hourly for detox symptoms. However, easily redirectable by nursing staff.  Latoya [name] was examined at bedside this morning. She reports not feeling well. She is having continued pain in her left leg. She also notes intermittent episodes of chills, hot flashes and sweating. We discussed altering her pain regimen with which she is agreeable. We also discussed plan for hydrotherapy today. All questions and concerns addressed.   Objective:  Vital signs in last 24 hours: Vitals:   04/05/19 1617 04/05/19 2021 04/06/19 0404 04/06/19 0758  BP: (!) 123/94 118/82 125/88 125/87  Pulse: 76 92 86 80  Resp:  14 14   Temp: 99.1 F (37.3 C) 98 F (36.7 C) 97.7 F (36.5 C) 98.5 F (36.9 C)  TempSrc: Oral Oral Oral Oral  SpO2: 100% 100% 99% 99%  Weight:      Height:       Physical Exam Vitals signs reviewed.  Constitutional:      General: She is in acute distress.     Appearance: She is diaphoretic. She is not ill-appearing or toxic-appearing.  Cardiovascular:     Rate and Rhythm: Regular rhythm. Tachycardia present.     Pulses: Normal pulses.     Heart sounds: Normal heart sounds.  Skin:    General: Skin is warm.     Comments: LLE: in dressing; distal pulses palpable  Neurological:     General: No focal deficit present.     Mental Status: She is alert and oriented to person, place, and time.    Assessment/Plan:  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    Left lower extremity cellulitis w/abscess: [name] initially presented with cellulitis of left leg on 11/22 and was discharged with clindamycin. However, patient continued to have pain, redness and  swelling in LLE and presented yesterday with abscess formation on tibia of LLE. She had attempted I&D in the ED but was unable to tolerate it further due to pain. Wound left open without packing. Patient was evaluated by Dr. Carola Frost yesterday and recommended for hydrotherapy and continued IV antibiotics. This morning, her left lower extremity is wrapped but is overall nontender to palpation with intact distal pulses. She is afebrile but tachycardic, likely from opioid withdrawal. She does not have any significant leukocytosis noted. Blood cultures and wound cultures pending.  - PT hydrotherapy  - Continue IV vancomycin q12h day 3 - f/u wound cultures  - f/u blood cultures  - CBC daily   Opioid use disorder: Patient with history of IV heroin use for 3 years. She has previously tried suboxone, methadone and inpatient rehab without significant success. She reports using 2g of heroin daily. Yesterday, patient offered suboxone and methadone but not interested. We revisited the options this morning but she is not interested at the moment. She reports active withdrawal as she is having intermittent episodes of chills and hot flashes. She appears uncomfortable on examination. She is diaphoretic and tachycardic on exam.  - Oxycodone IR 10mg  q4h - Dilaudid 1.5mg  q4h prn - Clonidine 0.1mg  q8h with hold parameters   Normocytic anemia w/ Hx of GI bleed: Hb 11.4, MCV 79. This is stable and patient does not have any signs of bleeding.  - Protonix 20mg  qd -  Ferrous sulfate 325mg  qAM - CBC daily   HCV-Ab positive: HCV RNA quant undetectable on 01/13/2019 at [facility]. LFTs wnl. Likely that patient has cleared HCV infection.  - Continue to monitor    FEN: Regular diet Code: FULL VTE Prophylaxis: Lovenox   Dispo: Anticipated discharge pending clinical improvement.   Harvie Heck, MD  Internal Medicine, PGY-1 04/06/2019, 10:00 AM Pager: [phone]

## 2019-04-06 NOTE — Progress Notes (Signed)
Physical Therapy Wound Treatment/Discharge Patient Details  Name: Latoya [name] MRN: [id number] Date of Birth: 1985/12/24  Today's Date: 04/06/2019 Time: 0920-0938 Time Calculation (min): 18 min  Subjective  Subjective: "Fuck no" pt yelled after ~21m of saline sprayed by pulsatile lavage gun.  Date of Onset: (over a week ago) Prior Treatments: Bedside I&D  Pain Score: Pain Score: 10-Worst pain ever  Wound Assessment  Wound / Incision (Open or Dehisced) 04/04/19 Other (Comment) Leg Left open abscess (Active)  Wound Image   04/06/19 0950  Dressing Type Non adherent;Gauze (Comment);Compression wrap 04/06/19 0950  Dressing Changed Changed 04/06/19 0950  Dressing Status Clean;Dry;Intact 04/06/19 0950  Dressing Change Frequency PRN 04/05/19 0910  Site / Wound Assessment Red;Pink 04/06/19 0950  % Wound base Red or Granulating 100% 04/06/19 0950  % Wound base Yellow/Fibrinous Exudate 0% 04/06/19 0950  % Wound base Black/Eschar 0% 04/06/19 0950  % Wound base Other/Granulation Tissue (Comment) 0% 04/06/19 0950  Peri-wound Assessment Edema;Erythema (blanchable) 04/06/19 0950  Wound Length (cm) 1.8 cm 04/06/19 0950  Wound Width (cm) 0.8 cm 04/06/19 0950  Wound Depth (cm) 0.8 cm 04/06/19 0950  Wound Volume (cm^3) 1.15 cm^3 04/06/19 0950  Wound Surface Area (cm^2) 1.44 cm^2 04/06/19 0950  Margins Unattached edges (unapproximated) 04/06/19 0950  Closure None 04/06/19 0950  Drainage Amount Minimal 04/06/19 0950  Drainage Description Serosanguineous 04/06/19 0950  Treatment Hydrotherapy (Pulse lavage) 04/06/19 0950   Hydrotherapy Pulsed lavage therapy - wound location: lt lower leg Pulsed Lavage with Suction (psi): 4 psi Pulsed Lavage with Suction - Normal Saline Used: Other (comment)(10 mL) Pulsed Lavage Tip: Tip with splash shield   Wound Assessment and Plan  Wound Therapy - Assess/Plan/Recommendations Wound Therapy - Clinical Statement: Pt presents to hydrotherapy with lt lower leg  abscess due to IV drug use. Pt s/p bedside I&D. Pt didn't tolerate pulsatile lavage beyond 136m Wound looks clean. Will sign off and defer further dressing changes to nursing.  Factors Delaying/Impairing Wound Healing: Infection - systemic/local;Substance abuse Hydrotherapy Plan: Other (comment)(Recommend dc hydro and nursing to change dressing)  Wound Therapy Goals- Improve the function of patient's integumentary system by progressing the wound(s) through the phases of wound healing (inflammation - proliferation - remodeling) by:    Goals will be updated until maximal potential achieved or discharge criteria met.  Discharge criteria: when goals achieved, discharge from hospital, MD decision/surgical intervention, no progress towards goals, refusal/missing three consecutive treatments without notification or medical reason.  GP     CaShary Decampaycok 04/06/2019, 10:03 AM [place] [phone] [phone]

## 2019-04-06 NOTE — Progress Notes (Signed)
Patient reports having withdrawal symptoms- crying, co having pain all over, feeling hot then cold.  Methodone started as ordered.

## 2019-04-07 DIAGNOSIS — F1190 Opioid use, unspecified, uncomplicated: Secondary | ICD-10-CM

## 2019-04-07 DIAGNOSIS — B950 Streptococcus, group A, as the cause of diseases classified elsewhere: Secondary | ICD-10-CM

## 2019-04-07 LAB — COMPREHENSIVE METABOLIC PANEL
ALT: 28 U/L (ref 0–44)
AST: 18 U/L (ref 15–41)
Albumin: 3.2 g/dL — ABNORMAL LOW (ref 3.5–5.0)
Alkaline Phosphatase: 77 U/L (ref 38–126)
Anion gap: 15 (ref 5–15)
BUN: 6 mg/dL (ref 6–20)
CO2: 21 mmol/L — ABNORMAL LOW (ref 22–32)
Calcium: 9.2 mg/dL (ref 8.9–10.3)
Chloride: 106 mmol/L (ref 98–111)
Creatinine, Ser: 0.64 mg/dL (ref 0.44–1.00)
GFR calc Af Amer: >60 mL/min (ref >60)
GFR calc non Af Amer: >60 mL/min (ref >60)
Glucose, Bld: 99 mg/dL (ref 70–99)
Potassium: 3.8 mmol/L (ref 3.5–5.1)
Sodium: 142 mmol/L (ref 135–145)
Total Bilirubin: 0.4 mg/dL (ref 0.3–1.2)
Total Protein: 7.3 g/dL (ref 6.5–8.1)

## 2019-04-07 LAB — CBC
HCT: 37.9 % (ref 36.0–46.0)
Hemoglobin: 11.7 g/dL — ABNORMAL LOW (ref 12.0–15.0)
MCH: 24.3 pg — ABNORMAL LOW (ref 26.0–34.0)
MCHC: 30.9 g/dL (ref 30.0–36.0)
MCV: 78.8 fL — ABNORMAL LOW (ref 80.0–100.0)
Platelets: 399 10*3/uL (ref 150–400)
RBC: 4.81 MIL/uL (ref 3.87–5.11)
RDW: 16 % — ABNORMAL HIGH (ref 11.5–15.5)
WBC: 6.9 10*3/uL (ref 4.0–10.5)
nRBC: 0 % (ref 0.0–0.2)

## 2019-04-07 MED ORDER — METHADONE HCL 10 MG PO TABS: 30.0000 mg | ORAL_TABLET | Freq: Every day | ORAL | 0 refills | Status: AC

## 2019-04-07 MED ORDER — SULFAMETHOXAZOLE-TRIMETHOPRIM 800-160 MG PO TABS: 1.0000 | ORAL_TABLET | Freq: Two times a day (BID) | ORAL | 0 refills | Status: DC

## 2019-04-07 NOTE — Progress Notes (Signed)
  Subjective:  Patient evaluated at bedside this AM. Patient states her pain is improved and the dilaudid and methadone are helpful for the pain. Patient is interested in using methadone at discharge for pain control. Patient states she does not want to use heroin again and is interested in methadone therapy as outpatient.  Objective:    Vital Signs (last 24 hours): Vitals:   04/06/19 1931 04/06/19 2120 04/07/19 0330 04/07/19 0542  BP: 115/74 118/88 112/83 115/84  Pulse: 89 84 67 72  Resp: 18  16   Temp: 98.6 F (37 C)  98.7 F (37.1 C)   TempSrc: Oral  Oral   SpO2: 100%  96%   Weight:      Height:        Physical Exam: General Alert and answers questions appropriately, no acute distress  Skin Incision on left lower extremity with reduced swelling and minimal drainage. See photo below  Extremities No peripheral edema, moves all extremities       Assessment/Plan:   Principal Problem:   Cellulitis and abscess of right leg Active Problems:   Opioid use disorder (HCC)   HCV antibody positive  Patient is a 33 year old female with past medical history of opioid use disorder, bacterial endocarditis, prior MSSA bacteremia and tricuspid valve vegetation status post angio back in September 2020 presenting with purulent skin and soft tissue infection of left lower extremity status post failure of conservative antibiotic therapy.  # Left lower extremity cellulitis with abscess: Patient initially presented with cellulitis of left leg on 11/22 was discharged on clindamycin.  With continued pain, redness, swelling in left lower extremity patient presented 2 days ago with abscess formation on tibia.  Partial I&D performed, stop due to pain.  Patient remains afebrile with normal WBC. Bcx x2 show NGTD (2 days) and superficial wound cultures growing Group A. Strep. * Received 3.5 days of IV vanc. Will provide prescription for Bactrim to complete total 7 day course of antibiotics. * 7 days of  Methadone 30 mg QD for pain control * Discharge today    Jeanmarie Hubert, MD 04/07/2019, 7:27 AM Pager: [phone]

## 2019-04-07 NOTE — Progress Notes (Signed)
The dressing was removed for assessment before discharging.  Open wound noted to the left lower (frontal) leg.  The wound is red/pink with no foul odor or drainage noted at this time.  The patient was educated on good hand washing and wound care.  The patient states that she understands.  A discharge packet was provided and given to the patient.  The information was reviewed and the the patient verbalizes understanding the instructions, follow up and medication regimen.  The patient states that she is not sure that she will be able to stop IV drug use, she reports that she does not have a good support system.  She is tearful.  The patient can be heard from room on the phone,loudly cursing with someone.

## 2019-04-07 NOTE — Progress Notes (Signed)
   04/07/19 1411  TOC Assessment  Discharge Planning Services Other - See comment (Substance abuse resources)   CM provided and discussed the substance abuse resources with the patient who verbalized understanding.  Midge Minium MSN, RN, NCM-BC, ACM-RN [phone]

## 2019-04-08 NOTE — Discharge Summary (Addendum)
Name: Latoya [name] MRN: [id number] DOB: 12/11/1985 33 y.o. PCP: Patient, No Pcp Per  Date of Admission: 04/04/2019  3:25 PM Date of Discharge: 04/07/2019 Attending Physician: No att. providers found  Discharge Diagnosis: Principal Problem:   Cellulitis and abscess of right leg Active Problems:   Opioid use disorder (HCC)   HCV antibody positive   Discharge Medications: Allergies as of 04/07/2019       Reactions   Naltrexone Anxiety   Severe anxiety, restlessness, vomiting        Medication List     STOP taking these medications    clindamycin 300 MG capsule Commonly known as: CLEOCIN       TAKE these medications    ferrous sulfate 325 (65 FE) MG tablet Take 325 mg by mouth daily with breakfast.   methadone 10 MG tablet Commonly known as: Dolophine Take 3 tablets (30 mg total) by mouth daily for 7 days.   mirtazapine 15 MG tablet Commonly known as: REMERON Take 15 mg by mouth at bedtime.   sulfamethoxazole-trimethoprim 800-160 MG tablet Commonly known as: BACTRIM DS Take 1 tablet by mouth 2 (two) times daily.   traZODone 50 MG tablet Commonly known as: DESYREL Take 50-100 mg by mouth at bedtime.        Disposition and follow-up:   Ms.Latoya [name] was discharged from [facility] in Stable condition.  At the hospital follow up visit please address:  1. Please assess if patient has completed Bactrim antibiotics - was prescribed a 4-week course. Please assess wound for improvement, see photo below for state of wound at discharge.  Please discuss if patient has gotten established at a methadone clinic.  2.  Labs / imaging needed at time of follow-up: CBC to followup on patients anemia  3.  Pending labs/ test needing follow-up: None  Follow-up Appointments: Follow-up Information     [facility]. Call.   Why: Please call the clinic today or tomorrow to schedule an appointment for early next week. We  have messaged the clinic and they are aware you will be calling to schedule an appointment. Contact information: [address] [place] [address] [phone]           Hospital Course by problem list: # Left lower extremity cellulitis with abscess: Patient presented with purulent skin and soft tissue infection of left lower extremity status post failure of outpatient treatment with clindamycin.  Partial I&D was performed in the emergency department, stopped due to pain.  Blood cultures showed no growth at 4 days, patient remained afebrile, white count within normal limits.  Patient was treated with IV vancomycin, receiving approximately 3 days of therapy.  Superficial wound cultures grew group a streptococcus, this may have been a contaminant.  Patient discharged with PO regiment of Bactrim to complete total 7-day course.  # Opioid use disorder: Patient with history of IV heroin use for 3 years, reports currently using 2 g of heroin daily.  Patient was discharged on methadone 30 mg daily for pain control which will also help with withdrawal symptoms until patient can establish with methadone clinic.  Patient interested in opiate replacement therapy with methadone.  Patient provided with resource information by social work.  # Normocytic anemia with history of GI bleed: Hemoglobin stable, ranging 10-12 during admission. On iron supplementation.     Discharge Vitals:   BP 102/74 (BP Location: Left Arm)   Pulse 65   Temp 98.8 F (37.1  C) (Oral)   Resp 18   Ht 5\' 6"  (1.676 m)   Wt 79.4 kg   SpO2 99%   BMI 28.25 kg/m   Pertinent Labs, Studies, and Procedures:  CBC Latest Ref Rng & Units 04/07/2019 04/06/2019 04/05/2019  WBC 4.0 - 10.5 K/uL 6.9 6.9 7.4  Hemoglobin 12.0 - 15.0 g/dL 11.7(L) 11.4(L) 10.4(L)  Hematocrit 36.0 - 46.0 % 37.9 36.7 33.7(L)  Platelets 150 - 400 K/uL 399 410(H) 344   BMP Latest Ref Rng & Units 04/07/2019 04/06/2019 04/05/2019  Glucose 70 - 99  mg/dL 99 102(H) 94  BUN 6 - 20 mg/dL 6 5(L) 9  Creatinine 0.44 - 1.00 mg/dL 0.64 0.60 0.51  Sodium 135 - 145 mmol/L 142 140 141  Potassium 3.5 - 5.1 mmol/L 3.8 3.5 3.8  Chloride 98 - 111 mmol/L 106 107 107  CO2 22 - 32 mmol/L 21(L) 24 25  Calcium 8.9 - 10.3 mg/dL 9.2 9.0 8.5(L)   DG Tibia/Fibula Left (04/04/19):  IMPRESSION: Infiltration of subcutaneous fat could be due to dependent change or cellulitis. The exam is otherwise negative. No change compared to the prior study.  Discharge Instructions: Discharge Instructions     Call MD for:  persistant nausea and vomiting   Complete by: As directed    Call MD for:  redness, tenderness, or signs of infection (pain, swelling, redness, odor or green/yellow discharge around incision site)   Complete by: As directed    Call MD for:  severe uncontrolled pain   Complete by: As directed    Call MD for:  temperature >100.4   Complete by: As directed    Diet - low sodium heart healthy   Complete by: As directed    Discharge instructions   Complete by: As directed    You were seen int he hospital for an infection of your right leg. You were given I.V. antibiotics in the hospital and have been given a prescription for an antibiotic called Bactrim. We want you to take this antibiotic twice daily for four days. It is important that you take this in order to help clear the infection.  We have also given you a prescription for 7 days of methadone to help with the pain you are having.  Thank you for allowing [place] to be part of your medical care!   Increase activity slowly   Complete by: As directed        Signed: Jeanmarie Hubert, MD 04/08/2019, 3:29 PM   Pager: [phone]

## 2019-04-09 ENCOUNTER — Telehealth: Payer: Self-pay | Admitting: *Deleted

## 2019-04-09 LAB — CULTURE, BLOOD (ROUTINE X 2)
Culture: NO GROWTH
Culture: NO GROWTH

## 2019-04-09 NOTE — Telephone Encounter (Addendum)
Information was faxed to [employer] for [place] for Methadone 10 mg tablets.  Awaiting determination. I [id number]. Sander Nephew, RN 04/09/2019 11:49 AM. Call to [employer] Methadone  was denied as patient has been on Morphine, Dilaudid and Oxycodone within the last 4 weeks.  Patient dosing considered 2 high.  May be able to get a short acting Opoid instead for this.  Unable to find documentation of Opoid usage for Chronic pain in last 4 weeks.  Message to be sent to Dr.  Benjamine Mola to consider a short acting Opoid for patient if needed.  Sander Nephew, RN 04/13/2019 12:04 PM.

## 2019-04-10 ENCOUNTER — Emergency Department (HOSPITAL_COMMUNITY)
Admission: EM | Admit: 2019-04-10 | Discharge: 2019-04-11 | Discharge status: Against Medical Advice | Payer: Medicaid Other | Attending: Emergency Medicine | Admitting: Emergency Medicine

## 2019-04-10 ENCOUNTER — Other Ambulatory Visit: Payer: Self-pay

## 2019-04-10 ENCOUNTER — Encounter (HOSPITAL_COMMUNITY): Payer: Self-pay | Admitting: Emergency Medicine

## 2019-04-10 DIAGNOSIS — Z79899 Other long term (current) drug therapy: Secondary | ICD-10-CM | POA: Diagnosis not present

## 2019-04-10 DIAGNOSIS — K0889 Other specified disorders of teeth and supporting structures: Secondary | ICD-10-CM | POA: Diagnosis present

## 2019-04-10 DIAGNOSIS — K047 Periapical abscess without sinus: Secondary | ICD-10-CM | POA: Diagnosis not present

## 2019-04-10 DIAGNOSIS — F17210 Nicotine dependence, cigarettes, uncomplicated: Secondary | ICD-10-CM | POA: Insufficient documentation

## 2019-04-10 HISTORY — DX: Opioid dependence, uncomplicated: F11.20

## 2019-04-10 MED ORDER — AMOXICILLIN 500 MG PO CAPS: 500.0000 mg | ORAL_CAPSULE | Freq: Three times a day (TID) | ORAL | 0 refills | Status: DC

## 2019-04-10 MED ORDER — LIDOCAINE VISCOUS HCL 2 % MT SOLN: 15.0000 mL | OROMUCOSAL | 0 refills | Status: DC | PRN

## 2019-04-10 NOTE — ED Triage Notes (Signed)
Patient reports left lower molar pain with swelling onset this week unrelieved by OTC pain medications.

## 2019-04-10 NOTE — ED Provider Notes (Signed)
[place] EMERGENCY DEPARTMENT Provider Note   CSN: [id number] Arrival date & time: 04/10/19  1942     History   Chief Complaint Chief Complaint  Patient presents with  . Dental Pain    HPI Latoya [name] is a 33 y.o. female with a history of IV heroin use who presents to the emergency department with a chief complaint of dental pain.  The patient endorses a left lower dental pain that began a few days ago.  Pain has been constant and is accompanied by mild swelling to the left lower face.  Both have been worsening since onset.  She reports that she broke the tooth that is painful sometime ago, but denies any recent injury or worsening fracture.  She is not established with a dentist.  No shortness of breath, trismus, muffled voice, drooling, drainage from the area, fever, or chills.  The patient was recently admitted from 11/28-12/1 for cellulitis and abscess of the left leg that failed outpatient oral antibiotics with clindamycin.  She was treated with vancomycin for 3 days.  Blood culture grew group A streptococcus, but this was thought to be a contaminant.  She was discharged with a 7-day course of Bactrim, which she has been taking.  The patient reports that she has been treating her pain with 400 mg of ibuprofen every 4 hours and a Tylenol.  Per chart review, patient is awaiting prior authorization to initiate methadone.     The history is provided by the patient. No language interpreter was used.    Past Medical History:  Diagnosis Date  . Heroin addiction ([place])   . IV drug abuse [facility])     Patient Active Problem List   Diagnosis Date Noted  . Opioid use disorder ([place]) 04/05/2019  . HCV antibody positive 04/05/2019  . Cellulitis and abscess of right leg 04/04/2019  . Anemia of infection and chronic disease 01/10/2019    History reviewed. No pertinent surgical history.   OB History   No obstetric history on file.      Home Medications     Prior to Admission medications   Medication Sig Start Date End Date Taking? Authorizing Provider  amoxicillin (AMOXIL) 500 MG capsule Take 1 capsule (500 mg total) by mouth 3 (three) times daily. 04/10/19   Randy Castrejon A, PA-C  ferrous sulfate 325 (65 FE) MG tablet Take 325 mg by mouth daily with breakfast. 02/24/19   [provider]  lidocaine (XYLOCAINE) 2 % solution Use as directed 15 mLs in the mouth or throat every 3 (three) hours as needed for mouth pain. 04/10/19   Vernis Cabacungan A, PA-C  methadone (DOLOPHINE) 10 MG tablet Take 3 tablets (30 mg total) by mouth daily for 7 days. 04/07/19 04/14/19  Jeanmarie Hubert, MD  mirtazapine (REMERON) 15 MG tablet Take 15 mg by mouth at bedtime.    [provider]  sulfamethoxazole-trimethoprim (BACTRIM DS) 800-160 MG tablet Take 1 tablet by mouth 2 (two) times daily. 04/07/19   Jeanmarie Hubert, MD  traZODone (DESYREL) 50 MG tablet Take 50-100 mg by mouth at bedtime.    [provider]    Family History No family history on file.  Social History Social History   Tobacco Use  . Smoking status: Light Tobacco Smoker  . Smokeless tobacco: Never Used  Substance Use Topics  . Alcohol use: Yes  . Drug use: Yes    Comment: Heroin      Allergies   Naltrexone  Review of Systems Review of Systems  Constitutional: Negative for appetite change, chills and fever.  HENT: Positive for dental problem and facial swelling. Negative for drooling, ear pain, nosebleeds, postnasal drip, rhinorrhea, sore throat, tinnitus, trouble swallowing and voice change.   Eyes: Negative for pain and redness.  Respiratory: Negative for cough, shortness of breath and wheezing.   Cardiovascular: Negative for chest pain.  Gastrointestinal: Negative for abdominal pain, nausea and vomiting.  Musculoskeletal: Negative for neck pain and neck stiffness.  Skin: Negative for color change and rash.  Neurological: Negative for syncope, weakness,  light-headedness and headaches.  All other systems reviewed and are negative.    Physical Exam Updated Vital Signs BP 121/87 (BP Location: Right Arm)   Pulse 98   Temp 98.7 F (37.1 C) (Oral)   Resp 18   LMP 03/29/2019 (Approximate)   SpO2 97%   Physical Exam Vitals signs and nursing note reviewed.  Constitutional:      General: She is not in acute distress. HENT:     Head: Normocephalic.     Mouth/Throat:     Dentition: Abnormal dentition. Does not have dentures. Dental tenderness, gingival swelling, dental caries and dental abscesses present.     Pharynx: Oropharynx is clear. Uvula midline. No pharyngeal swelling, oropharyngeal exudate, posterior oropharyngeal erythema or uvula swelling.     Tonsils: No tonsillar exudate.      Comments: Tooth 19 is fractured it is tender to palpation.  There is mild swelling of the gingiva with minimal fluctuance.  There is no active drainage or drainage able to be expressed. Tooth 17 is also fractured.  Uvula is midline.  There is no trismus.  There is narrow swelling of the neck.  There is a small nodule located along the body of the left mandible.  Minimal fluctuance is also noted.  Speaks in complete, fluent sentences.  Tolerating secretions without difficulty.  No sublingual edema. Eyes:     Conjunctiva/sclera: Conjunctivae normal.  Neck:     Musculoskeletal: Neck supple.  Cardiovascular:     Rate and Rhythm: Normal rate and regular rhythm.     Heart sounds: No murmur. No friction rub. No gallop.   Pulmonary:     Effort: Pulmonary effort is normal. No respiratory distress.  Abdominal:     General: There is no distension.     Palpations: Abdomen is soft.  Skin:    General: Skin is warm.     Findings: No rash.  Neurological:     Mental Status: She is alert.  Psychiatric:        Behavior: Behavior normal.      ED Treatments / Results  Labs (all labs ordered are listed, but only abnormal results are displayed) Labs Reviewed -  No data to display  EKG None  Radiology No results found.  Procedures Procedures (including critical care time)  Medications Ordered in ED Medications - No data to display   Initial Impression / Assessment and Plan / ED Course  I have reviewed the triage vital signs and the nursing notes.  Pertinent labs & imaging results that were available during my care of the patient were reviewed by me and considered in my medical decision making (see chart for details).        33 year old female with a history of IV heroin use awaiting prior authorization to get methadone who presents with a dental abscess to tooth 19.  There is a minimal fluctuance and along the angle to I&D at  this time. Exam unconcerning for Ludwig's angina or spread of infection.  Will treat with amoxicillin despite the patient currently being on Bactrim as this would not cover odontogenic infection.  She was given strict return precautions to the ER.  Patient is agreeable with this plan.  She was given a referral to dentistry as well as the on-call dentist as well as home care instructions.  Unfortunately, the patient eloped from the ER after my evaluation, but prior to receiving her prescription for antibiotics or her discharge instructions.  Final Clinical Impressions(s) / ED Diagnoses   Final diagnoses:  Dental abscess    ED Discharge Orders         Ordered    amoxicillin (AMOXIL) 500 MG capsule  3 times daily     04/10/19 2306    lidocaine (XYLOCAINE) 2 % solution  Every  3 hours PRN     04/10/19 2306           Hydie Langan A, PA-C 04/10/19 2350    Glynn Octave, MD 04/11/19 [phone]

## 2019-04-10 NOTE — Discharge Instructions (Addendum)
Thank you for allowing me to care for you today in the Emergency Department.   Please call Dr. Drinda Butts office to schedule a follow-up appointment.  Let them know you are following up from an ER visit.  You can also use the attached dental resource guide to find a dentist if you are unable to get into be seen by Dr. Haig Prophet.  Sometimes you may have to call all of the dentist on the list to be able to find someone to follow-up with.  Take 1 capsule of amoxicillin every 8 hours for the next week.  It is important to finish the entire course to prevent recurrence of infection.  Take 650 mg of Tylenol or 600 mg of ibuprofen with food every 6 hours for pain.  You can alternate between these 2 medications every 3 hours if your pain returns.  For instance, you can take Tylenol at noon, followed by a dose of ibuprofen at 3, followed by second dose of Tylenol and 6.  In particular, ibuprofen can help with inflammation and additional pain.  You can use of 15 mL of viscous lidocaine every 3 hours as needed for mouth pain.  Do not use this medication more than every 3 hours.  You can swish and spit warm salt water rinses in the mouth every 6 hours to help with pain and to prevent worsening infection.  You can apply an ice pack or cool compress to the face, but do not apply heat as this can cause worsening swelling.  Return to the emergency department if you develop significant swelling to the face or neck, high fevers and shivering despite taking amoxicillin, particularly if you have been on the antibiotic for more than 2 days, if you become unable to swallow, feel as if your throat is closing, become short of breath, or develop other new, concerning symptoms.

## 2019-04-13 ENCOUNTER — Encounter: Payer: Self-pay | Admitting: Internal Medicine

## 2019-04-13 NOTE — Telephone Encounter (Signed)
At this point, pain generator should be mostly resolved. For chronic methadone to treat her OUD she will need to go to a methadone treatment center, which is what she was planning on the last I spoke with her.

## 2019-06-02 ENCOUNTER — Other Ambulatory Visit: Payer: Self-pay

## 2019-06-02 ENCOUNTER — Emergency Department (HOSPITAL_COMMUNITY)
Admission: EM | Admit: 2019-06-02 | Discharge: 2019-06-03 | Discharge status: Against Medical Advice | Payer: Medicaid Other | Attending: Emergency Medicine | Admitting: Emergency Medicine

## 2019-06-02 ENCOUNTER — Encounter (HOSPITAL_COMMUNITY): Payer: Self-pay | Admitting: Emergency Medicine

## 2019-06-02 DIAGNOSIS — F17210 Nicotine dependence, cigarettes, uncomplicated: Secondary | ICD-10-CM | POA: Diagnosis not present

## 2019-06-02 DIAGNOSIS — I10 Essential (primary) hypertension: Secondary | ICD-10-CM | POA: Diagnosis not present

## 2019-06-02 DIAGNOSIS — L02415 Cutaneous abscess of right lower limb: Secondary | ICD-10-CM | POA: Diagnosis not present

## 2019-06-02 DIAGNOSIS — Z79899 Other long term (current) drug therapy: Secondary | ICD-10-CM | POA: Insufficient documentation

## 2019-06-02 DIAGNOSIS — M79661 Pain in right lower leg: Secondary | ICD-10-CM | POA: Diagnosis not present

## 2019-06-02 DIAGNOSIS — L03115 Cellulitis of right lower limb: Secondary | ICD-10-CM | POA: Insufficient documentation

## 2019-06-02 DIAGNOSIS — Z5320 Procedure and treatment not carried out because of patient's decision for unspecified reasons: Secondary | ICD-10-CM | POA: Diagnosis not present

## 2019-06-02 DIAGNOSIS — L089 Local infection of the skin and subcutaneous tissue, unspecified: Secondary | ICD-10-CM | POA: Diagnosis present

## 2019-06-02 DIAGNOSIS — R52 Pain, unspecified: Secondary | ICD-10-CM | POA: Diagnosis not present

## 2019-06-02 DIAGNOSIS — M7989 Other specified soft tissue disorders: Secondary | ICD-10-CM | POA: Diagnosis not present

## 2019-06-02 LAB — CBC WITH DIFFERENTIAL/PLATELET
Abs Immature Granulocytes: 0.04 10*3/uL (ref 0.00–0.07)
Basophils Absolute: 0 10*3/uL (ref 0.0–0.1)
Basophils Relative: 0 %
Eosinophils Absolute: 0.1 10*3/uL (ref 0.0–0.5)
Eosinophils Relative: 1 %
HCT: 35.1 % — ABNORMAL LOW (ref 36.0–46.0)
Hemoglobin: 10.8 g/dL — ABNORMAL LOW (ref 12.0–15.0)
Immature Granulocytes: 0 %
Lymphocytes Relative: 21 %
Lymphs Abs: 2.4 10*3/uL (ref 0.7–4.0)
MCH: 24.1 pg — ABNORMAL LOW (ref 26.0–34.0)
MCHC: 30.8 g/dL (ref 30.0–36.0)
MCV: 78.2 fL — ABNORMAL LOW (ref 80.0–100.0)
Monocytes Absolute: 1 10*3/uL (ref 0.1–1.0)
Monocytes Relative: 9 %
Neutro Abs: 7.9 10*3/uL — ABNORMAL HIGH (ref 1.7–7.7)
Neutrophils Relative %: 69 %
Platelets: 337 10*3/uL (ref 150–400)
RBC: 4.49 MIL/uL (ref 3.87–5.11)
RDW: 15.5 % (ref 11.5–15.5)
WBC: 11.5 10*3/uL — ABNORMAL HIGH (ref 4.0–10.5)
nRBC: 0 % (ref 0.0–0.2)

## 2019-06-02 MED ORDER — SODIUM CHLORIDE 0.9% FLUSH
3.0000 mL | Freq: Once | INTRAVENOUS | Status: AC
  Administered 2019-06-03: 3 mL via INTRAVENOUS

## 2019-06-02 NOTE — ED Triage Notes (Signed)
Per EMS, pt states she cut her Right leg 3 days ago.  Right leg is red, swollen and is draining.  She states Tylenol has not worked w/ the pain.    135/101 120HR RR 24 98.1 100% RA

## 2019-06-03 ENCOUNTER — Emergency Department (HOSPITAL_COMMUNITY): Payer: Medicaid Other

## 2019-06-03 ENCOUNTER — Encounter (HOSPITAL_COMMUNITY): Payer: Self-pay | Admitting: Radiology

## 2019-06-03 DIAGNOSIS — M7989 Other specified soft tissue disorders: Secondary | ICD-10-CM | POA: Diagnosis not present

## 2019-06-03 DIAGNOSIS — M79661 Pain in right lower leg: Secondary | ICD-10-CM | POA: Diagnosis not present

## 2019-06-03 LAB — COMPREHENSIVE METABOLIC PANEL
ALT: 11 U/L (ref 0–44)
AST: 15 U/L (ref 15–41)
Albumin: 3.6 g/dL (ref 3.5–5.0)
Alkaline Phosphatase: 63 U/L (ref 38–126)
Anion gap: 12 (ref 5–15)
BUN: 11 mg/dL (ref 6–20)
CO2: 26 mmol/L (ref 22–32)
Calcium: 9.1 mg/dL (ref 8.9–10.3)
Chloride: 96 mmol/L — ABNORMAL LOW (ref 98–111)
Creatinine, Ser: 0.74 mg/dL (ref 0.44–1.00)
GFR calc Af Amer: >60 mL/min (ref >60)
GFR calc non Af Amer: >60 mL/min (ref >60)
Glucose, Bld: 83 mg/dL (ref 70–99)
Potassium: 3.9 mmol/L (ref 3.5–5.1)
Sodium: 134 mmol/L — ABNORMAL LOW (ref 135–145)
Total Bilirubin: 0.9 mg/dL (ref 0.3–1.2)
Total Protein: 8 g/dL (ref 6.5–8.1)

## 2019-06-03 LAB — I-STAT BETA HCG BLOOD, ED (MC, WL, AP ONLY)
I-stat hCG, quantitative: 27.3 m[IU]/mL — ABNORMAL HIGH (ref <5)
I-stat hCG, quantitative: 27.9 m[IU]/mL — ABNORMAL HIGH (ref <5)

## 2019-06-03 LAB — LACTIC ACID, PLASMA: Lactic Acid, Venous: 0.7 mmol/L (ref 0.5–1.9)

## 2019-06-03 IMAGING — CT CT EXTREM LOW W/ CM*R*
2 of 3 series · 8 of 33 positions shown, 10 images · IV contrast (agent unspecified)
Comparison: None.

CONTRAST:  100mL OMNIPAQUE IOHEXOL 300 MG/ML  SOLN

CLINICAL DATA: Limping, acute lower leg pain, infection suspected,
IVDU

EXAM:
CT OF THE LOWER RIGHT EXTREMITY WITH CONTRAST
TECHNIQUE: Multidetector CT imaging of the lower right extremity was performed
according to the standard protocol following intravenous contrast
administration.

[Series 5: extremity soft tissue · axial · 0.49mm/px · z∈[-1112,-320]mm · 5 of 572 slices shown, 7 images (1 of 2)]
[im 88/572  soft-tissue]
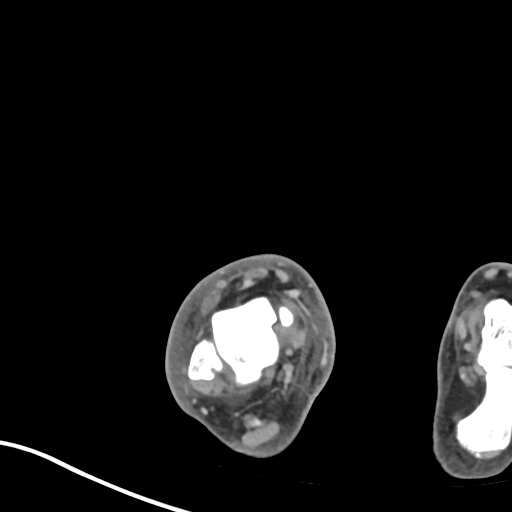
[im 88/572  bone]
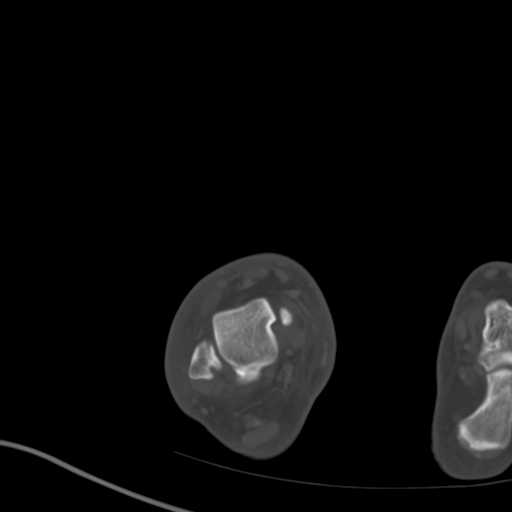
[im 176/572  bone]
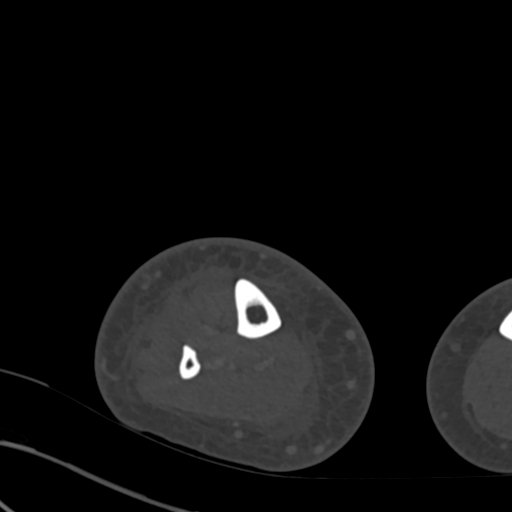
[im 308/572  bone]
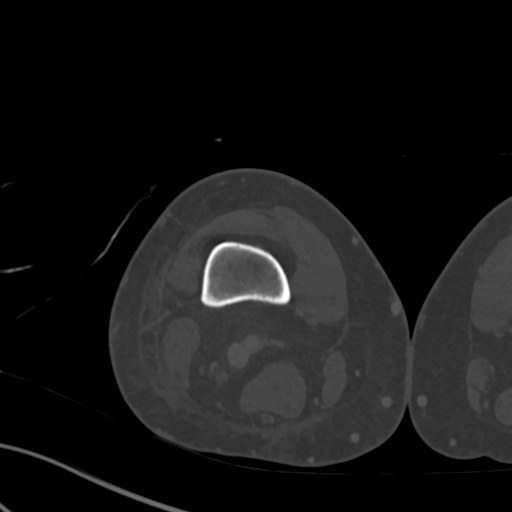
[im 396/572  bone]
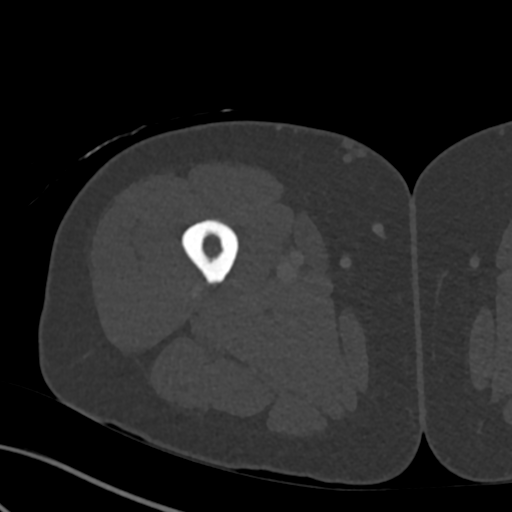
[im 484/572  soft-tissue]
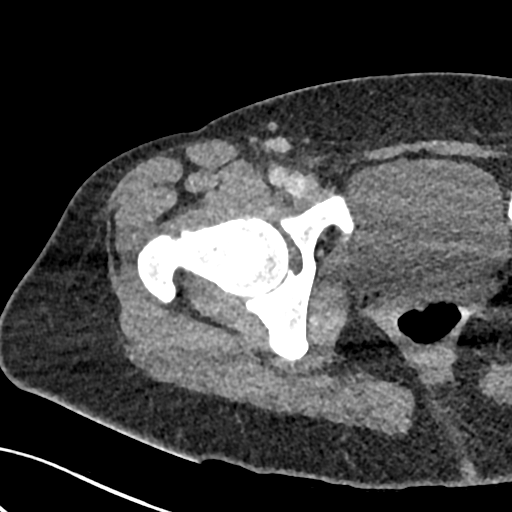
[im 484/572  bone]
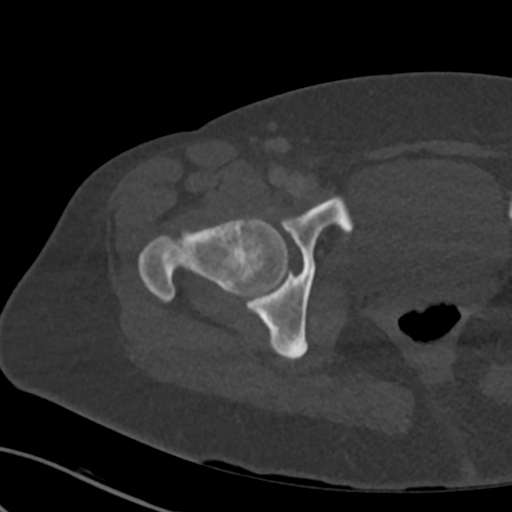

[Series 8: extremity soft tissue · coronal · 0.56mm/px · 3 of 126 slices shown (2 of 2)]
[im 26/126  bone]
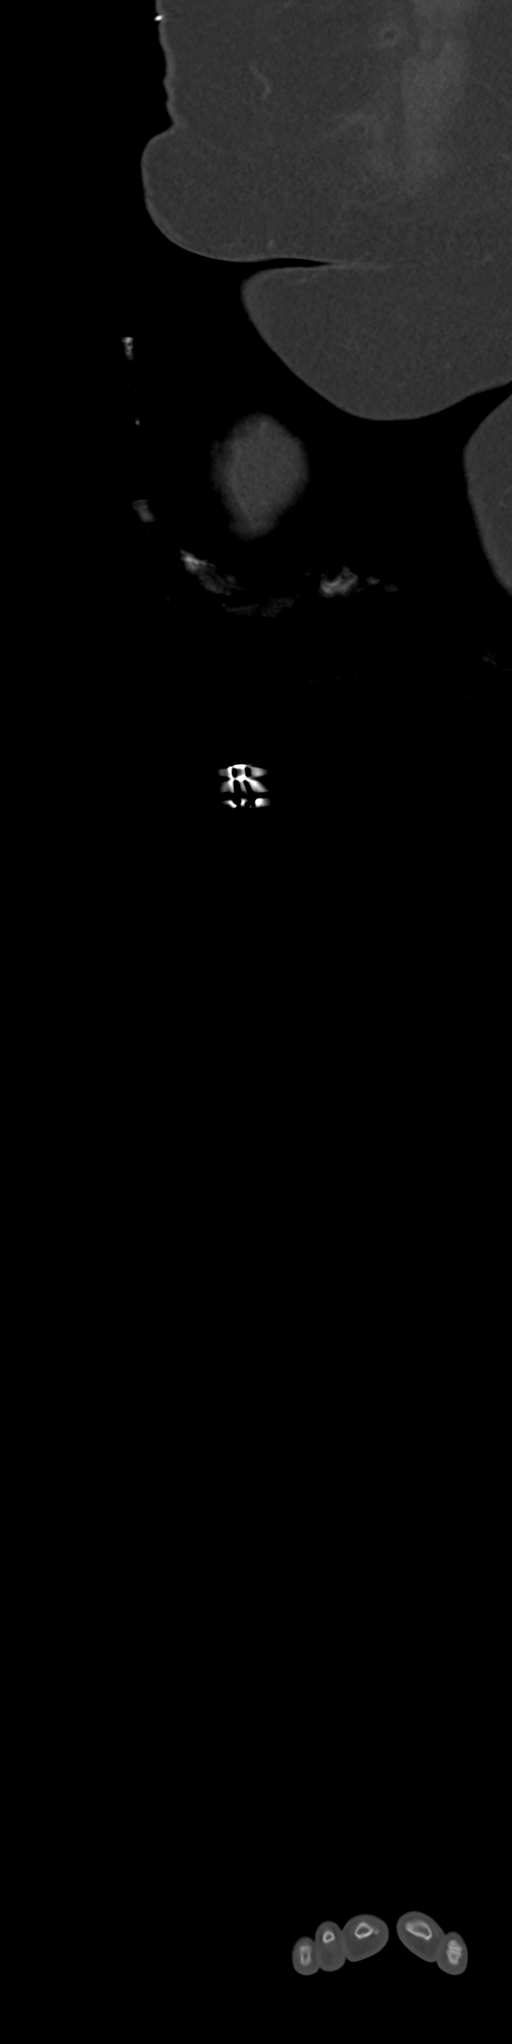
[im 51/126  bone]
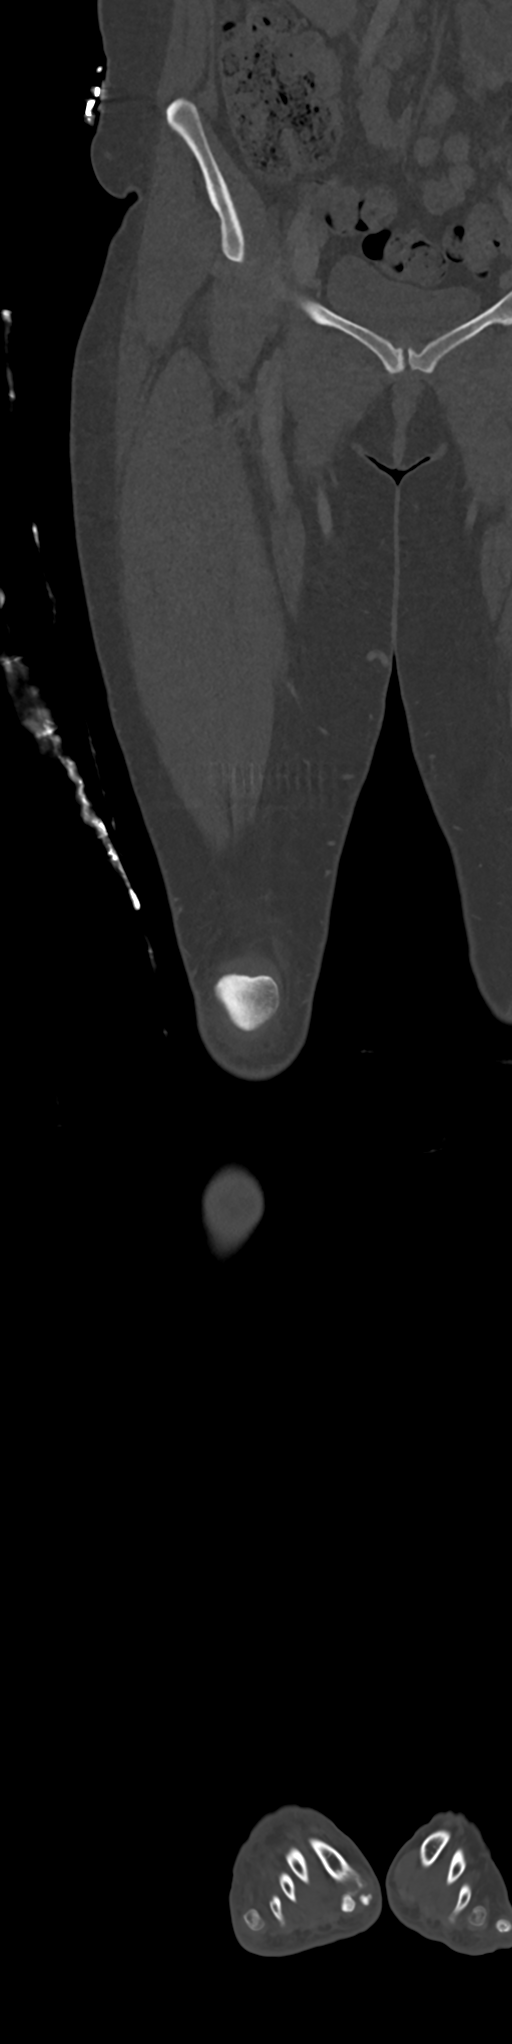
[im 76/126  bone]
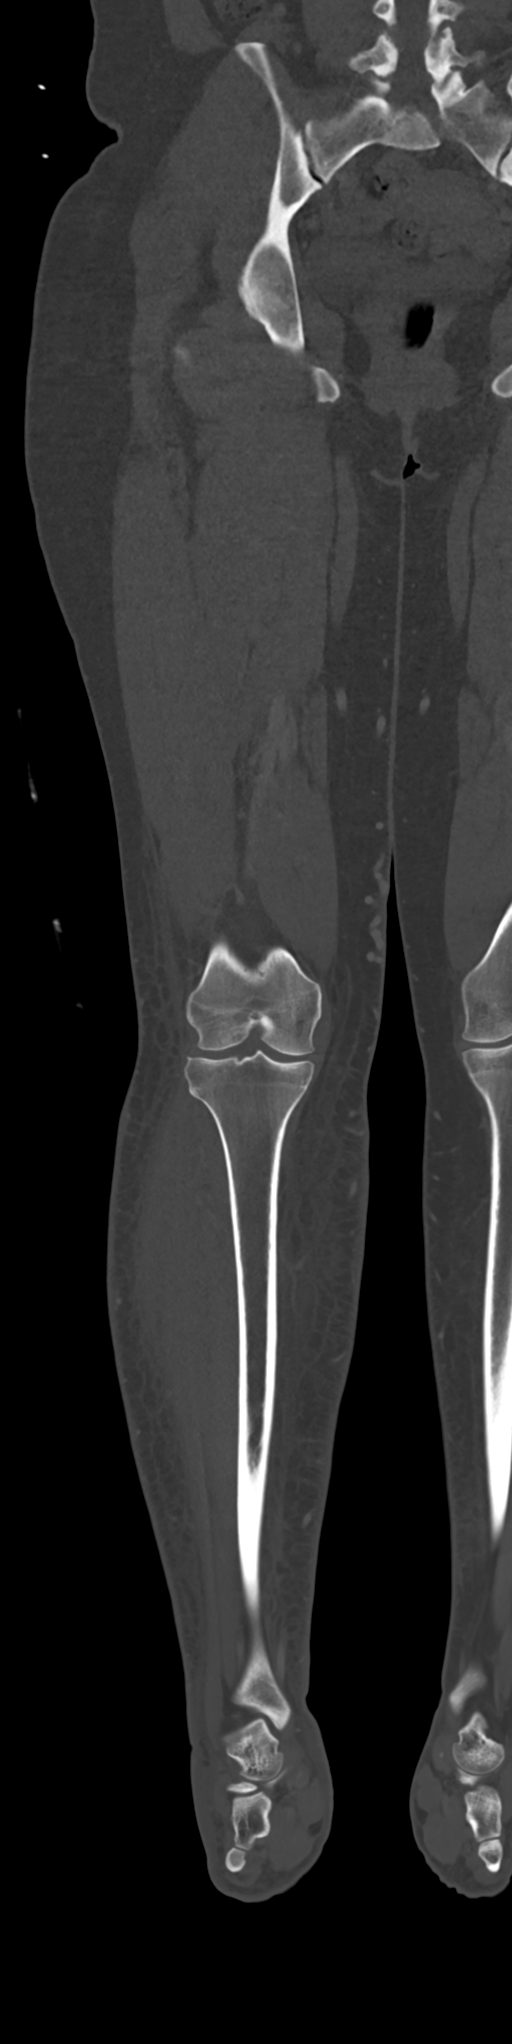

[8 of 33 positions shown; findings below may reference images not displayed]

FINDINGS: Bones/Joint/Cartilage

Included bones of the pelvis are intact and congruent. Right femoral
head is normally located. Femur is intact. Alignment of the knee is
preserved. Tibia and fibula are intact. Alignment of the ankle is
normal. Bones of the feet are free of acute abnormality. Small
accessory navicular bones are noted.

No areas of osseous erosion, destructive change or immature
periostitis to suggest features of osteomyelitis on this CT
examination.

Ligaments

Suboptimally assessed by CT.

Muscles and Tendons

Along the anterior aspect of the proximal tibialis anterior are
focal sites of adjacent soft tissue stranding and skin thickening
with overlying ulceration. Paired lentiform collections are seen
within the fascial tissues, intimate with the the surface of the
tibialis anterior which could reflect a subcutaneous abscess with
possible intramuscular extension. Superior most collection measuring
approximately 1.8 x 2 x 0.5 cm size. Similar collection is seen
approximately 3 cm distally measuring 3 x 2.3 x 0.5 cm.

Soft tissues

Focal rim enhancing collection in the subcutaneous tissues with
overlying skin thickening as detailed above. Collections are
contiguous with the fascial surface of the tibialis anterior with
possible intramuscular extension. There is diffuse circumferential
soft tissue swelling of the lower extremity extending from the
distal thigh through the ankle and foot. No soft tissue gas is seen.
No other focal collections are evident.
IMPRESSION: 1. Focal rim enhancing collections in the anterior soft tissues of
the proximal lower leg are intimate with the anterior surface of the
tibialis anterior muscle. Collections are worrisome for site of
injection and or possible abscess and may demonstrate some
intramuscular extension difficult to fully assess on this exam.
2. More diffuse circumferential soft tissue swelling of the lower
extremity extending from the distal thigh through the ankle and foot
is compatible with cellulitis.
3. No CT evidence of osteomyelitis.

## 2019-06-03 MED ORDER — SODIUM CHLORIDE 0.9 % IV BOLUS
1000.0000 mL | Freq: Once | INTRAVENOUS | Status: AC
  Administered 2019-06-03: 1000 mL via INTRAVENOUS

## 2019-06-03 MED ORDER — KETOROLAC TROMETHAMINE 15 MG/ML IJ SOLN
15.0000 mg | Freq: Once | INTRAMUSCULAR | Status: AC
  Administered 2019-06-03: 15 mg via INTRAVENOUS
  Filled 2019-06-03: qty 1

## 2019-06-03 MED ORDER — CLINDAMYCIN HCL 150 MG PO CAPS: 450.0000 mg | ORAL_CAPSULE | Freq: Three times a day (TID) | ORAL | 0 refills | Status: AC

## 2019-06-03 MED ORDER — IOHEXOL 300 MG/ML  SOLN
100.0000 mL | Freq: Once | INTRAMUSCULAR | Status: AC | PRN
  Administered 2019-06-03: 100 mL via INTRAVENOUS

## 2019-06-03 MED ORDER — CLINDAMYCIN PHOSPHATE 900 MG/50ML IV SOLN
900.0000 mg | Freq: Once | INTRAVENOUS | Status: AC
  Administered 2019-06-03: 900 mg via INTRAVENOUS
  Filled 2019-06-03: qty 50

## 2019-06-03 MED ORDER — VANCOMYCIN HCL 1.5 G IV SOLR
1500.0000 mg | Freq: Once | INTRAVENOUS | Status: DC
  Filled 2019-06-03: qty 1500

## 2019-06-03 MED ORDER — PIPERACILLIN-TAZOBACTAM 3.375 G IVPB 30 MIN
3.3750 g | Freq: Once | INTRAVENOUS | Status: AC
  Administered 2019-06-03: 3.375 g via INTRAVENOUS
  Filled 2019-06-03: qty 50

## 2019-06-03 MED ORDER — VANCOMYCIN HCL 1500 MG/300ML IV SOLN
1500.0000 mg | Freq: Once | INTRAVENOUS | Status: DC
  Filled 2019-06-03: qty 300

## 2019-06-03 NOTE — ED Notes (Signed)
Pt stated to this RN "I want to leave for [employer] and I will come back in a few hours for treatment". Pt advised of the negative outcomes and the patient understood. Cardama MD made aware and spoke with the patient about her intentions. Pt understood the risks. Pt conscious, breathing, and A&Ox4.

## 2019-06-03 NOTE — ED Notes (Signed)
Pt came to the ED per triage complaint. Pt conscious, breathing, and A&Ox4. Pt brought back to [facility] [address] via wheelchair. Pt endorses shooting heroin into her right shin. Upon examination patient has two open wounds with green discharge. Chest rise and fall equally with non-labored breathing. No distress noted. PIVC placed on the LAC with a 20G which had positive blood return and flushed without pain or infiltration. IVF and IV medications infusing per [facility] with no signs of infiltration. Cardama MD at bedside assessing and discussing plan of care. Will continue to monitor.

## 2019-06-03 NOTE — ED Notes (Signed)
Patient called for room, not in waiting area or outside.

## 2019-06-03 NOTE — ED Provider Notes (Signed)
[facility] EMERGENCY DEPARTMENT Provider Note  CSN: [id number] Arrival date & time: 06/02/19 2148  Chief Complaint(s) Wound Infection  HPI Celsey Asselin is a 34 y.o. female with a history of IV drug use who presents to the emergency department with right leg pain and swelling for the past several days related to IV drug use.  Patient has associated of the entire right lower leg from ankle to knee and to wounds on the anterior shin.  Pain is aching/throbbing and severe.  Worse with palpation and ambulation.  No alleviating factors.  No fevers or chills.  No chest pain or shortness of breath.  No coughing or congestion.  No abdominal pain.  No nausea or vomiting.  No history of DVTs or PEs.  Patient was admitted in November of last year for similar presentation of the left leg.  HPI  Past Medical History Past Medical History:  Diagnosis Date  . Heroin addiction ([place])   . IV drug abuse [facility])    Patient Active Problem List   Diagnosis Date Noted  . Opioid use disorder ([place]) 04/05/2019  . HCV antibody positive 04/05/2019  . Cellulitis and abscess of right leg 04/04/2019  . Anemia of infection and chronic disease 01/10/2019   Home Medication(s) Prior to Admission medications   Medication Sig Start Date End Date Taking? Authorizing Provider  amoxicillin (AMOXIL) 500 MG capsule Take 1 capsule (500 mg total) by mouth 3 (three) times daily. Patient not taking: Reported on 06/03/2019 04/10/19   McDonald, Maree Erie A, PA-C  clindamycin (CLEOCIN) 150 MG capsule Take 3 capsules (450 mg total) by mouth 3 (three) times daily for 7 days. 06/03/19 06/10/19  Fatima Blank, MD  lidocaine (XYLOCAINE) 2 % solution Use as directed 15 mLs in the mouth or throat every 3 (three) hours as needed for mouth pain. Patient not taking: Reported on 06/03/2019 04/10/19   McDonald, Maree Erie A, PA-C  sulfamethoxazole-trimethoprim (BACTRIM DS) 800-160 MG tablet Take 1 tablet by mouth 2 (two) times  daily. Patient not taking: Reported on 06/03/2019 04/07/19   Jeanmarie Hubert, MD                                                                                                                                    Past Surgical History History reviewed. No pertinent surgical history. Family History No family history on file.  Social History Social History   Tobacco Use  . Smoking status: Light Tobacco Smoker  . Smokeless tobacco: Never Used  Substance Use Topics  . Alcohol use: Yes  . Drug use: Yes    Comment: Heroin    Allergies Naltrexone  Review of Systems Review of Systems All other systems are reviewed and are negative for acute change except as noted in the HPI  Physical Exam Vital Signs  I have reviewed the triage vital signs BP 99/83 (BP Location: Right Arm)   Pulse (!) 101  Temp 99.4 F (37.4 C) (Oral)   Resp (!) 22   SpO2 100%   Physical Exam Vitals reviewed.  Constitutional:      General: She is not in acute distress.    Appearance: She is well-developed. She is obese. She is not diaphoretic.  HENT:     Head: Normocephalic and atraumatic.     Right Ear: External ear normal.     Left Ear: External ear normal.     Nose: Nose normal.  Eyes:     General: No scleral icterus.    Conjunctiva/sclera: Conjunctivae normal.  Neck:     Trachea: Phonation normal.  Cardiovascular:     Rate and Rhythm: Normal rate and regular rhythm.  Pulmonary:     Effort: Pulmonary effort is normal. No respiratory distress.     Breath sounds: No stridor.  Abdominal:     General: There is no distension.  Musculoskeletal:        General: Normal range of motion.     Cervical back: Normal range of motion.       Legs:  Neurological:     Mental Status: She is alert and oriented to person, place, and time.  Psychiatric:        Behavior: Behavior normal.       ED Results and Treatments Labs (all labs ordered are listed, but only abnormal results are displayed) Labs  Reviewed  COMPREHENSIVE METABOLIC PANEL - Abnormal; Notable for the following components:      Result Value   Sodium 134 (*)    Chloride 96 (*)    All other components within normal limits  CBC WITH DIFFERENTIAL/PLATELET - Abnormal; Notable for the following components:   WBC 11.5 (*)    Hemoglobin 10.8 (*)    HCT 35.1 (*)    MCV 78.2 (*)    MCH 24.1 (*)    Neutro Abs 7.9 (*)    All other components within normal limits  I-STAT BETA HCG BLOOD, ED ([facility], [facility], [facility] ONLY) - Abnormal; Notable for the following components:   I-stat hCG, quantitative 27.3 (*)    All other components within normal limits  I-STAT BETA HCG BLOOD, ED ([facility], [facility], [facility] ONLY) - Abnormal; Notable for the following components:   I-stat hCG, quantitative 27.9 (*)    All other components within normal limits  LACTIC ACID, PLASMA  LACTIC ACID, PLASMA  URINALYSIS, ROUTINE W REFLEX MICROSCOPIC                                                                                                                         EKG  EKG Interpretation  Date/Time:    Ventricular Rate:    PR Interval:    QRS Duration:   QT Interval:    QTC Calculation:   R Axis:     Text Interpretation:        Radiology CT EXTREMITY LOWER RIGHT W CONTRAST  Result Date: 06/03/2019 CLINICAL DATA:  Limping, acute lower  leg pain, infection suspected, IVDU EXAM: CT OF THE LOWER RIGHT EXTREMITY WITH CONTRAST TECHNIQUE: Multidetector CT imaging of the lower right extremity was performed according to the standard protocol following intravenous contrast administration. COMPARISON:  None. CONTRAST:  OMNIPAQUE IOHEXOL 300 MG/ML  SOLN FINDINGS: Bones/Joint/Cartilage Included bones of the pelvis are intact and congruent. Right femoral head is normally located. Femur is intact. Alignment of the knee is preserved. Tibia and fibula are intact. Alignment of the ankle is normal. Bones of the feet are free of acute abnormality. Small accessory navicular bones are noted.  No areas of osseous erosion, destructive change or immature periostitis to suggest features of osteomyelitis on this CT examination. Ligaments Suboptimally assessed by CT. Muscles and Tendons Along the anterior aspect of the proximal tibialis anterior are focal sites of adjacent soft tissue stranding and skin thickening with overlying ulceration. Paired lentiform collections are seen within the fascial tissues, intimate with the the surface of the tibialis anterior which could reflect a subcutaneous abscess with possible intramuscular extension. Superior most collection measuring approximately 1.8 x 2 x 0.5 cm size. Similar collection is seen approximately 3 cm distally measuring 3 x 2.3 x 0.5 cm. Soft tissues Focal rim enhancing collection in the subcutaneous tissues with overlying skin thickening as detailed above. Collections are contiguous with the fascial surface of the tibialis anterior with possible intramuscular extension. There is diffuse circumferential soft tissue swelling of the lower extremity extending from the distal thigh through the ankle and foot. No soft tissue gas is seen. No other focal collections are evident. IMPRESSION: 1. Focal rim enhancing collections in the anterior soft tissues of the proximal lower leg are intimate with the anterior surface of the tibialis anterior muscle. Collections are worrisome for site of injection and or possible abscess and may demonstrate some intramuscular extension difficult to fully assess on this exam. 2. More diffuse circumferential soft tissue swelling of the lower extremity extending from the distal thigh through the ankle and foot is compatible with cellulitis. 3. No CT evidence of osteomyelitis. Electronically Signed   By: Kreg Shropshire M.D.   On: 06/03/2019 04:17    Pertinent labs & imaging results that were available during my care of the patient were reviewed by me and considered in my medical decision making (see chart for details).  Medications  Ordered in ED Medications  Vancomycin (VANCOCIN) 1,500 mg in sodium chloride 0.9 % 500 mL IVPB ( Intravenous Canceled Entry 06/03/19 0425)  sodium chloride flush (NS) 0.9 % injection 3 mL (3 mLs Intravenous Given 06/03/19 0311)  sodium chloride 0.9 % bolus 1,000 mL (0 mLs Intravenous Stopped 06/03/19 0425)  ketorolac (TORADOL) 15 MG/ML injection 15 mg (15 mg Intravenous Given 06/03/19 0253)  clindamycin (CLEOCIN) IVPB 900 mg (0 mg Intravenous Stopped 06/03/19 0324)  piperacillin-tazobactam (ZOSYN) IVPB 3.375 g (0 g Intravenous Stopped 06/03/19 0358)  iohexol (OMNIPAQUE) 300 MG/ML solution 100 mL (100 mLs Intravenous Contrast Given 06/03/19 0323)  Procedures Procedures  (including critical care time)  Medical Decision Making / ED Course I have reviewed the nursing notes for this encounter and the patient's prior records (if available in EHR or on provided paperwork).   Olivia Pavelko was evaluated in Emergency Department on 06/03/2019 for the symptoms described in the history of present illness. She was evaluated in the context of the global COVID-19 pandemic, which necessitated consideration that the patient might be at risk for infection with the SARS-CoV-2 virus that causes COVID-19. Institutional protocols and algorithms that pertain to the evaluation of patients at risk for COVID-19 are in a state of rapid change based on information released by regulatory bodies including the CDC and federal and state organizations. These policies and algorithms were followed during the patient's care in the ED.  Patient presents with right leg infection secondary to IV drug use. Treated empirically for possible necrotizing fasciitis with clindamycin, Zosyn and vancomycin.  CT scan revealed cellulitis with likely anterior abscess. Patient is not septic. Discussed need for I&D and  admission for IV antibiotics.  Patient declined and chose to leave AMA stating that she wanted to see her boyfriend and get real food.  She reported that she will return later on today.   She will be sent out with a prescription for clindamycin in case she does not return.  Date: 06/03/2019 Patient: Robi Mitter Honsinger or her authorized caregiver has made the decision for the patient to leave the emergency department against the advice of Chasya Zenz, Amadeo Garnet, *. She or her authorized caregiver has been informed about any test results if available and understands the inherent risks of leaving, including worsening infection, permanent disability or death. She or her authorized caregiver has decided to accept the responsibility for this decision. She or her authorized caregiver is not intoxicated and has demonstrated decision making capacity. Lametria Graffius and all necessary parties have been advised that she may return for further evaluation or treatment at any time and they are recommended alternative follow up if continually unwilling. Her condition at time of discharge was Stable.  Vital signs Blood pressure 99/83, pulse (!) 101, temperature 99.4 F (37.4 C), temperature source Oral, resp. rate (!) 22, height 5\' 6"  (1.676 m), weight 79.4 kg, SpO2 100 %.           Final Clinical Impression(s) / ED Diagnoses Final diagnoses:  Cellulitis and abscess of right leg      This chart was dictated using voice recognition software.  Despite best efforts to proofread,  errors can occur which can change the documentation meaning.   , MD 06/03/19 [phone]

## 2019-06-03 NOTE — ED Notes (Signed)
Pt to and from CT via cart. No distress noted.  

## 2019-06-03 NOTE — ED Notes (Signed)
Pt left prior to receiving ABX prescription. MD made aware. Pt left the ED with a smooth and steady gait. Speaking in complete sentences. No distress noted. Chest rise and fall equally with non-labored breathing.

## 2019-07-28 ENCOUNTER — Encounter (HOSPITAL_COMMUNITY): Payer: Self-pay | Admitting: Emergency Medicine

## 2019-07-28 ENCOUNTER — Emergency Department (HOSPITAL_COMMUNITY): Payer: Medicaid Other

## 2019-07-28 ENCOUNTER — Inpatient Hospital Stay (HOSPITAL_COMMUNITY)
Admission: EM | Admit: 2019-07-28 | Discharge: 2019-08-02 | DRG: 549 | Discharge status: Against Medical Advice | Payer: Medicaid Other | Attending: Internal Medicine | Admitting: Internal Medicine

## 2019-07-28 ENCOUNTER — Other Ambulatory Visit: Payer: Self-pay

## 2019-07-28 DIAGNOSIS — M4646 Discitis, unspecified, lumbar region: Secondary | ICD-10-CM

## 2019-07-28 DIAGNOSIS — F1199 Opioid use, unspecified with unspecified opioid-induced disorder: Secondary | ICD-10-CM | POA: Diagnosis present

## 2019-07-28 DIAGNOSIS — B999 Unspecified infectious disease: Secondary | ICD-10-CM | POA: Diagnosis present

## 2019-07-28 DIAGNOSIS — F1110 Opioid abuse, uncomplicated: Secondary | ICD-10-CM | POA: Diagnosis present

## 2019-07-28 DIAGNOSIS — F17200 Nicotine dependence, unspecified, uncomplicated: Secondary | ICD-10-CM | POA: Diagnosis present

## 2019-07-28 DIAGNOSIS — R531 Weakness: Secondary | ICD-10-CM | POA: Diagnosis not present

## 2019-07-28 DIAGNOSIS — E669 Obesity, unspecified: Secondary | ICD-10-CM | POA: Diagnosis present

## 2019-07-28 DIAGNOSIS — Z590 Homelessness: Secondary | ICD-10-CM

## 2019-07-28 DIAGNOSIS — R509 Fever, unspecified: Secondary | ICD-10-CM | POA: Diagnosis not present

## 2019-07-28 DIAGNOSIS — F1910 Other psychoactive substance abuse, uncomplicated: Secondary | ICD-10-CM | POA: Diagnosis present

## 2019-07-28 DIAGNOSIS — B9562 Methicillin resistant Staphylococcus aureus infection as the cause of diseases classified elsewhere: Secondary | ICD-10-CM | POA: Diagnosis present

## 2019-07-28 DIAGNOSIS — I071 Rheumatic tricuspid insufficiency: Secondary | ICD-10-CM | POA: Diagnosis present

## 2019-07-28 DIAGNOSIS — Z6826 Body mass index (BMI) 26.0-26.9, adult: Secondary | ICD-10-CM

## 2019-07-28 DIAGNOSIS — M545 Low back pain: Secondary | ICD-10-CM | POA: Diagnosis not present

## 2019-07-28 DIAGNOSIS — B1920 Unspecified viral hepatitis C without hepatic coma: Secondary | ICD-10-CM | POA: Diagnosis present

## 2019-07-28 DIAGNOSIS — Z20822 Contact with and (suspected) exposure to covid-19: Secondary | ICD-10-CM | POA: Diagnosis present

## 2019-07-28 DIAGNOSIS — M009 Pyogenic arthritis, unspecified: Principal | ICD-10-CM | POA: Diagnosis present

## 2019-07-28 DIAGNOSIS — F1190 Opioid use, unspecified, uncomplicated: Secondary | ICD-10-CM | POA: Diagnosis present

## 2019-07-28 DIAGNOSIS — Z888 Allergy status to other drugs, medicaments and biological substances status: Secondary | ICD-10-CM

## 2019-07-28 DIAGNOSIS — R768 Other specified abnormal immunological findings in serum: Secondary | ICD-10-CM | POA: Diagnosis present

## 2019-07-28 DIAGNOSIS — D509 Iron deficiency anemia, unspecified: Secondary | ICD-10-CM | POA: Diagnosis present

## 2019-07-28 DIAGNOSIS — R262 Difficulty in walking, not elsewhere classified: Secondary | ICD-10-CM | POA: Diagnosis present

## 2019-07-28 DIAGNOSIS — E871 Hypo-osmolality and hyponatremia: Secondary | ICD-10-CM | POA: Diagnosis present

## 2019-07-28 DIAGNOSIS — D638 Anemia in other chronic diseases classified elsewhere: Secondary | ICD-10-CM | POA: Diagnosis present

## 2019-07-28 DIAGNOSIS — Z8619 Personal history of other infectious and parasitic diseases: Secondary | ICD-10-CM

## 2019-07-28 LAB — URINALYSIS, ROUTINE W REFLEX MICROSCOPIC
Bacteria, UA: NONE SEEN
Bilirubin Urine: NEGATIVE
Glucose, UA: NEGATIVE mg/dL
Hgb urine dipstick: NEGATIVE
Ketones, ur: NEGATIVE mg/dL
Nitrite: NEGATIVE
Protein, ur: NEGATIVE mg/dL
Specific Gravity, Urine: 1.018 (ref 1.005–1.030)
pH: 6 (ref 5.0–8.0)

## 2019-07-28 LAB — COMPREHENSIVE METABOLIC PANEL
ALT: 19 U/L (ref 0–44)
AST: 21 U/L (ref 15–41)
Albumin: 3.6 g/dL (ref 3.5–5.0)
Alkaline Phosphatase: 69 U/L (ref 38–126)
Anion gap: 12 (ref 5–15)
BUN: 6 mg/dL (ref 6–20)
CO2: 25 mmol/L (ref 22–32)
Calcium: 8.7 mg/dL — ABNORMAL LOW (ref 8.9–10.3)
Chloride: 96 mmol/L — ABNORMAL LOW (ref 98–111)
Creatinine, Ser: 0.72 mg/dL (ref 0.44–1.00)
GFR calc Af Amer: >60 mL/min (ref >60)
GFR calc non Af Amer: >60 mL/min (ref >60)
Glucose, Bld: 85 mg/dL (ref 70–99)
Potassium: 3.9 mmol/L (ref 3.5–5.1)
Sodium: 133 mmol/L — ABNORMAL LOW (ref 135–145)
Total Bilirubin: 0.7 mg/dL (ref 0.3–1.2)
Total Protein: 8 g/dL (ref 6.5–8.1)

## 2019-07-28 LAB — CBC WITH DIFFERENTIAL/PLATELET
Abs Immature Granulocytes: 0 10*3/uL (ref 0.00–0.07)
Basophils Absolute: 0 10*3/uL (ref 0.0–0.1)
Basophils Relative: 0 %
Eosinophils Absolute: 0.1 10*3/uL (ref 0.0–0.5)
Eosinophils Relative: 1 %
HCT: 41.7 % (ref 36.0–46.0)
Hemoglobin: 12.5 g/dL (ref 12.0–15.0)
Lymphocytes Relative: 53 %
Lymphs Abs: 4.2 10*3/uL — ABNORMAL HIGH (ref 0.7–4.0)
MCH: 23.6 pg — ABNORMAL LOW (ref 26.0–34.0)
MCHC: 30 g/dL (ref 30.0–36.0)
MCV: 78.8 fL — ABNORMAL LOW (ref 80.0–100.0)
Monocytes Absolute: 0.6 10*3/uL (ref 0.1–1.0)
Monocytes Relative: 7 %
Neutro Abs: 3.1 10*3/uL (ref 1.7–7.7)
Neutrophils Relative %: 39 %
Platelets: 207 10*3/uL (ref 150–400)
RBC: 5.29 MIL/uL — ABNORMAL HIGH (ref 3.87–5.11)
RDW: 16.8 % — ABNORMAL HIGH (ref 11.5–15.5)
WBC: 8 10*3/uL (ref 4.0–10.5)
nRBC: 0 % (ref 0.0–0.2)

## 2019-07-28 LAB — RAPID URINE DRUG SCREEN, HOSP PERFORMED
Amphetamines: POSITIVE — AB
Barbiturates: NOT DETECTED
Benzodiazepines: NOT DETECTED
Cocaine: NOT DETECTED
Opiates: POSITIVE — AB
Tetrahydrocannabinol: NOT DETECTED

## 2019-07-28 LAB — PREGNANCY, URINE: Preg Test, Ur: NEGATIVE

## 2019-07-28 LAB — LACTIC ACID, PLASMA: Lactic Acid, Venous: 1.5 mmol/L (ref 0.5–1.9)

## 2019-07-28 IMAGING — MR MR LUMBAR SPINE WO/W CM
4 of 7 series · 23 of 48 positions shown · IV contrast (gadavist)
Comparison: Prior CT from [DATE]

CLINICAL DATA: Initial evaluation for acute fever and back pain.
History of IV drug abuse.

EXAM:
MRI LUMBAR SPINE WITHOUT AND WITH CONTRAST
TECHNIQUE: Multiplanar and multiecho pulse sequences of the lumbar spine were
obtained without and with intravenous contrast.
CONTRAST:  7mL GADAVIST GADOBUTROL 1 MMOL/ML IV SOLN

[Series 14: T2 · sagittal · 4.0mm · 0.73mm/px · 5 of 16 slices shown (1 of 2)]
[im 1/16]
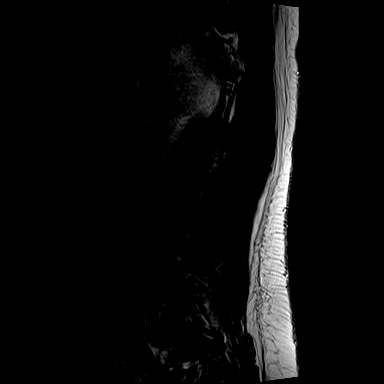
[im 4/16]
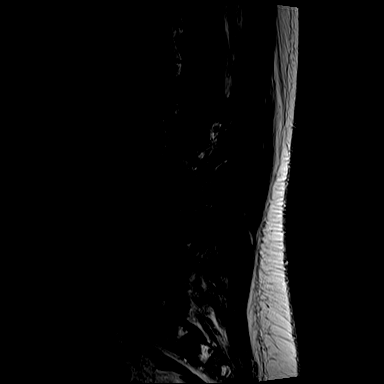
[im 8/16]
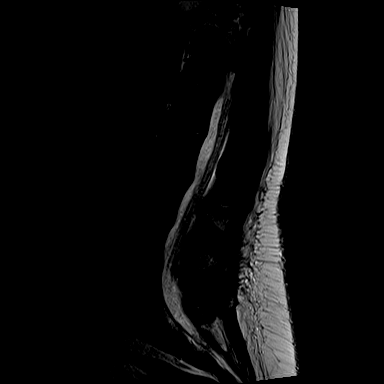
[im 12/16]
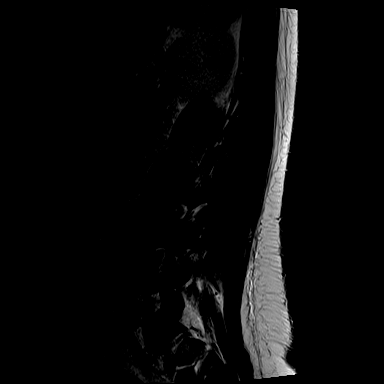
[im 16/16]
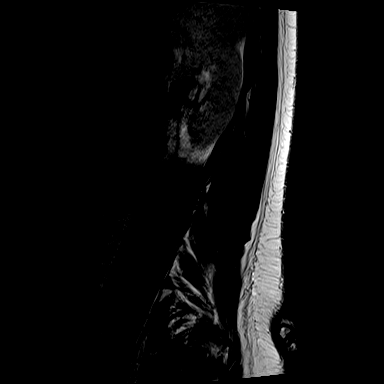

[Series 15: T1 · sagittal · 4.0mm · 0.88mm/px · 4 of 16 slices shown (1 of 2)]
[im 1/16]
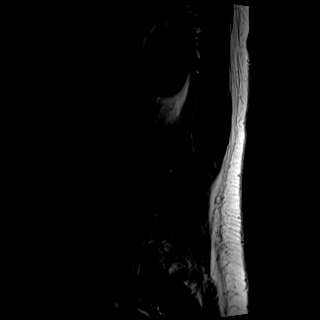
[im 6/16]
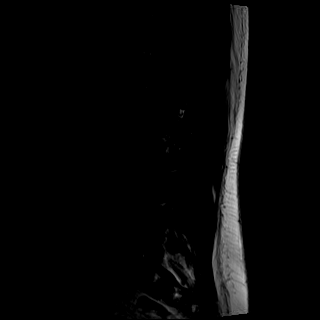
[im 11/16]
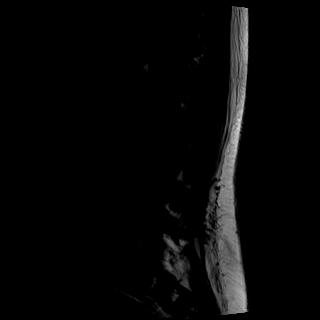
[im 16/16]
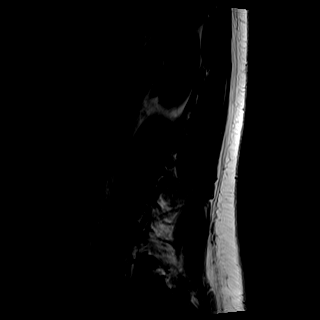

[Series 16: T2 · axial · 4.0mm · 0.57mm/px · z∈[-167,+33]mm · 8 of 36 slices shown (2 of 2)]
[im 1/36]
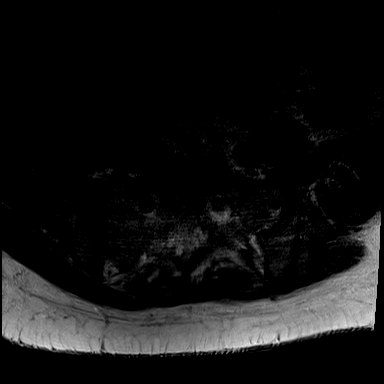
[im 4/36]
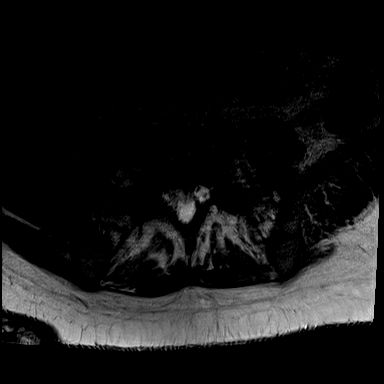
[im 12/36]
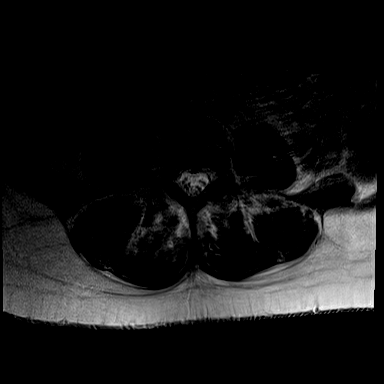
[im 16/36]
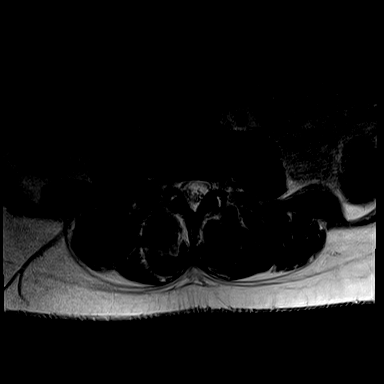
[im 20/36]
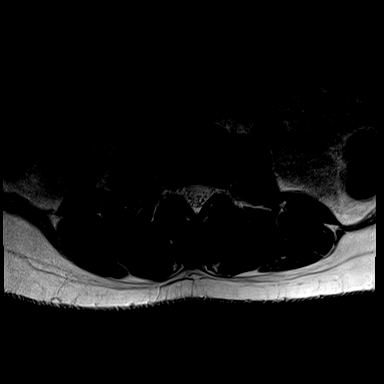
[im 24/36]
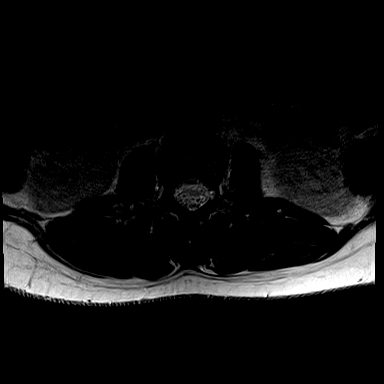
[im 32/36]
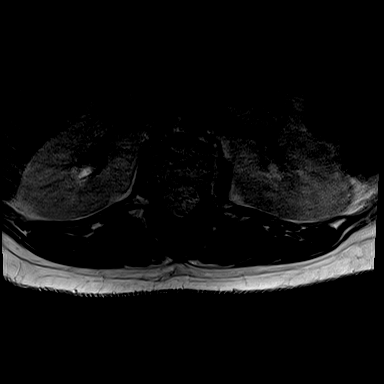
[im 36/36]
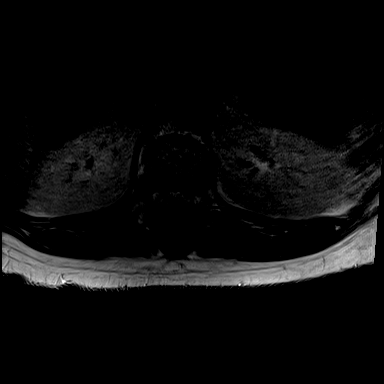

[Series 17: T1 · axial · 4.0mm · 0.34mm/px · z∈[-167,+13]mm · 6 of 36 slices shown (2 of 2)]
[im 1/36]
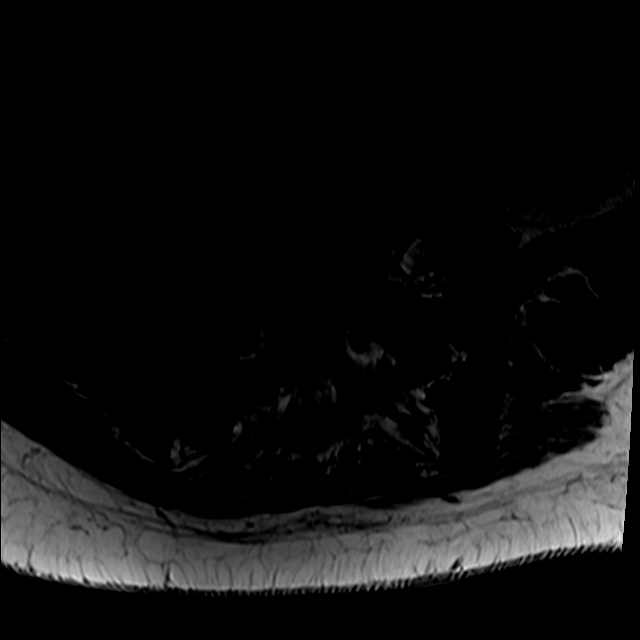
[im 4/36]
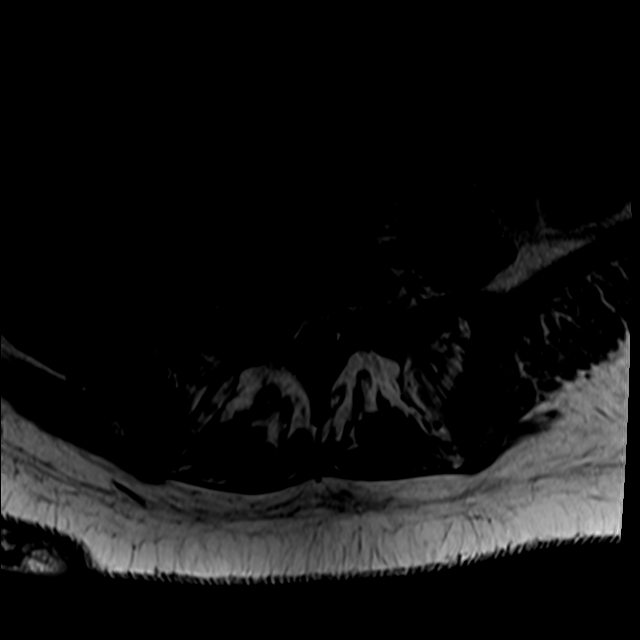
[im 12/36]
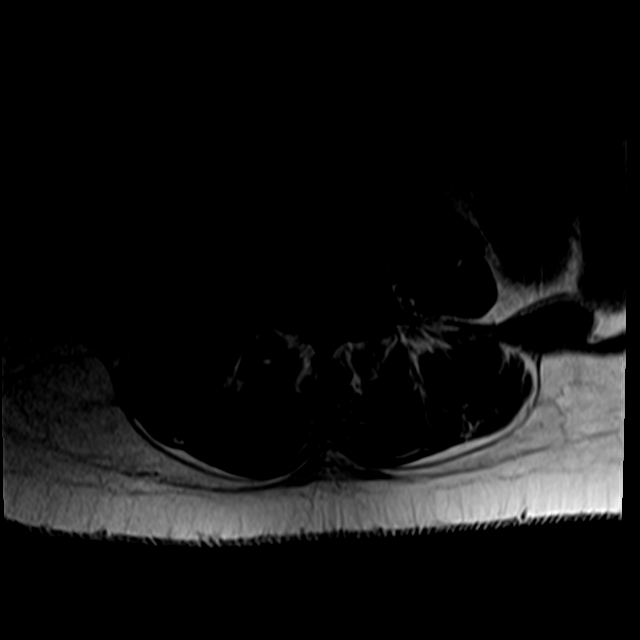
[im 16/36]
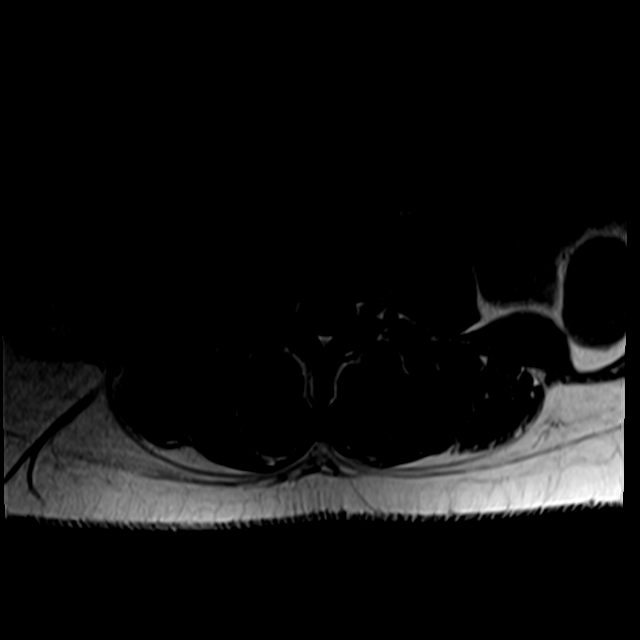
[im 20/36]
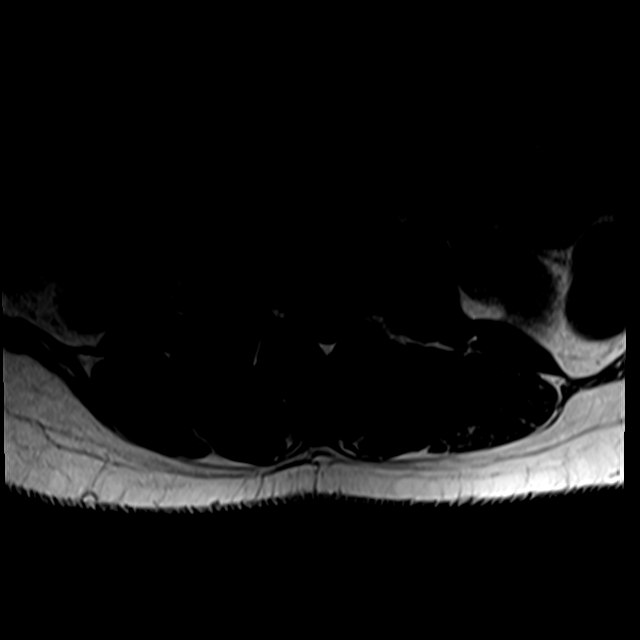
[im 32/36]
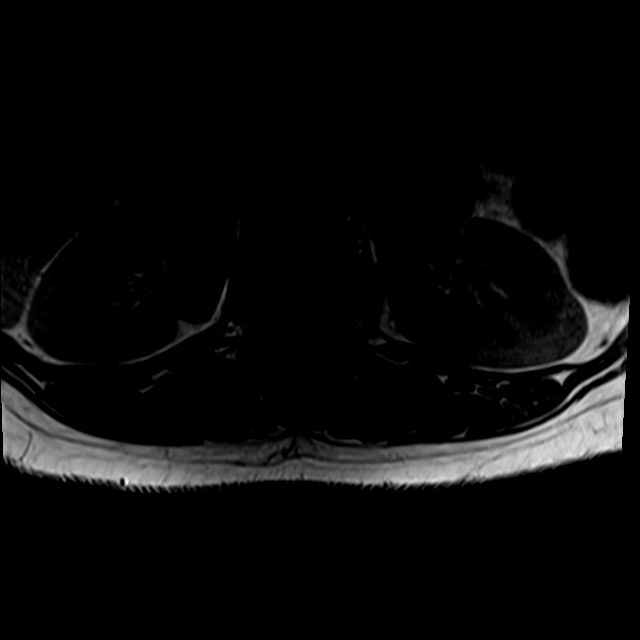

[23 of 48 positions shown; findings below may reference images not displayed]

FINDINGS: Segmentation: Standard. Lowest well-formed disc space labeled the
L5-S1 level.

Alignment: Levoscoliosis. Alignment otherwise normal with
preservation of the normal lumbar lordosis. No listhesis.

Vertebrae: Vertebral body height maintained without evidence for
acute or chronic fracture. Bone marrow signal intensity diffusely
decreased on T1 weighted imaging, nonspecific, but most commonly
related to anemia, smoking, or obesity. Mild reactive marrow edema
seen about the right greater than left L4-5 facets bilaterally.
Associated trace bilateral joint effusions. While these findings
could be degenerative in nature, possible septic arthritis could
also be considered given the provided history. No other abnormal
marrow edema or enhancement to suggest osteomyelitis or discitis.
Subcentimeter benign hemangioma noted within the L3 vertebral body.
No other discrete or worrisome osseous lesions.

Conus medullaris and cauda equina: Conus extends to the L1 level.
Conus and cauda equina appear normal. Mild epidural enhancement seen
adjacent to the L4-5 facets without frank epidural abscess or
collection (series 18, image 11). No other evidence for acute
intracanalicular infection.

Paraspinal and other soft tissues: Soft tissue edema and enhancement
seen within the right greater than left posterior paraspinous soft
tissues adjacent to the L4-5 facets. No discrete soft tissue abscess
or drainable fluid collection seen on this motion degraded exam.
Visualized soft tissues otherwise within normal limits. Partially
visualized visceral structures unremarkable.

Disc levels:

L1-2:  Unremarkable.

L2-3: Disc desiccation with mild annular disc bulge. Endplate
Schmorl's node deformity. No stenosis or impingement.

L3-4:  Minimal annular disc bulge.  No stenosis or impingement.

L4-5: Negative interspace. Bilateral facet hypertrophy with
associated trace bilateral joint effusions. Resultant mild spinal
stenosis. Foramina remain patent.

L5-S1: Negative interspace. Mild right greater than left facet
hypertrophy. No stenosis.
IMPRESSION: 1. Reactive marrow edema and enhancement about the right greater
than left L4-5 facets. While these findings may in part be
degenerative in nature, appearance is concerning for possible acute
septic arthritis given provided history. Associated mild epidural
and paraspinous soft tissue enhancement without frank epidural or
soft tissue abscess.
2. No other evidence for acute infection within the lumbar spine.

## 2019-07-28 MED ORDER — LORAZEPAM 2 MG/ML IJ SOLN
2.0000 mg | Freq: Once | INTRAMUSCULAR | Status: AC
  Administered 2019-07-28: 2 mg via INTRAVENOUS

## 2019-07-28 MED ORDER — METHOCARBAMOL 1000 MG/10ML IJ SOLN
1000.0000 mg | Freq: Once | INTRAMUSCULAR | Status: AC
  Administered 2019-07-28: 1000 mg via INTRAMUSCULAR
  Filled 2019-07-28: qty 10

## 2019-07-28 MED ORDER — GADOBUTROL 1 MMOL/ML IV SOLN
7.0000 mL | Freq: Once | INTRAVENOUS | Status: AC | PRN
  Administered 2019-07-28: 7 mL via INTRAVENOUS

## 2019-07-28 MED ORDER — FENTANYL CITRATE (PF) 100 MCG/2ML IJ SOLN
100.0000 ug | Freq: Once | INTRAMUSCULAR | Status: AC
  Administered 2019-07-28: 100 ug via INTRAVENOUS
  Filled 2019-07-28: qty 2

## 2019-07-28 MED ORDER — LORAZEPAM 2 MG/ML IJ SOLN
1.0000 mg | Freq: Once | INTRAMUSCULAR | Status: DC
  Filled 2019-07-28: qty 1

## 2019-07-28 MED ORDER — HYDROMORPHONE HCL 1 MG/ML IJ SOLN
1.0000 mg | Freq: Once | INTRAMUSCULAR | Status: AC
  Administered 2019-07-28: 1 mg via INTRAVENOUS
  Filled 2019-07-28: qty 1

## 2019-07-28 NOTE — ED Provider Notes (Signed)
34 year old female received at sign out from [facility] pending MRI. Per her HPI:  "34 year old female with an extensive history of heroin abuse, multiple trips to the ER for cellulitis/abscesses on her legs presents to the ER for gradual onset of right low back pain which started 2 days ago.  She reports lifting boxes in her home, but does not recall an episode of trauma or sudden onset of pain.  She woke up last night with severe pain that prevented her from getting up out of the bed.  The pain is continued to get worse.  She reports 10 out of 10 pain that is almost caused syncope upon getting up off the toilet.  Also endorses muscle spasms in her right lower back.  She has tried hot baths, Epson salt baths, BC powder, cold compress with no relief.  Reports difficulty walking, cannot get out of her car.  Ports fever last night of 101 along with chills which has now resolved.  She has never had pain like this before.  Nothing seems to alleviate her pain, pain worse on movement.  She denies loss of bowel and bladder, numbness, weakness, dysuria, hematuria, frequency, fever, flank pain.  No history of kidney stones.  History provided by the patient."  Physical Exam  BP 114/69   Pulse (!) 116   Temp (!) 101.2 F (38.4 C) (Oral)   Resp 18   Ht 5\' 6"  (1.676 m)   Wt 74.8 kg   LMP 07/07/2019   SpO2 91%   BMI 26.63 kg/m   Physical Exam Vitals and nursing note reviewed.  Constitutional:      General: She is not in acute distress.    Comments: Tearful.  Cries out in pain with any positional changes.  HENT:     Head: Normocephalic.  Eyes:     Conjunctiva/sclera: Conjunctivae normal.  Cardiovascular:     Rate and Rhythm: Normal rate and regular rhythm.     Pulses: Normal pulses.     Heart sounds: No murmur. No friction rub. No gallop.   Pulmonary:     Effort: Pulmonary effort is normal. No respiratory distress.     Breath sounds: No stridor. No wheezing, rhonchi or rales.  Chest:     Chest  wall: No tenderness.  Abdominal:     General: There is no distension.     Palpations: Abdomen is soft.     Tenderness: There is no abdominal tenderness.  Musculoskeletal:        General: Tenderness present.     Cervical back: Neck supple.     Right lower leg: No edema.     Left lower leg: No edema.     Comments: Tender to palpation to the lumbar spine.  No crepitus or step-offs.  No overlying redness or warmth.  There is some redness and warmth to the right hip, but she has no focal tenderness on my exam.  Full passive range of motion without pain of the right hip.  Skin:    General: Skin is warm.     Findings: No rash.  Neurological:     Mental Status: She is alert.  Psychiatric:        Behavior: Behavior normal.     ED Course/Procedures     .Critical Care Performed by: Joanne Gavel, PA-C Authorized by: Joanne Gavel, PA-C   Critical care provider statement:    Critical care time (minutes):  40   Critical care time was exclusive of:  Separately billable procedures and treating other patients and teaching time   Critical care was necessary to treat or prevent imminent or life-threatening deterioration of the following conditions:  Sepsis   Critical care was time spent personally by me on the following activities:  Discussions with consultants, ordering and review of radiographic studies, ordering and review of laboratory studies, re-evaluation of patient's condition, obtaining history from patient or surrogate, examination of patient, evaluation of patient's response to treatment and review of old charts   I assumed direction of critical care for this patient from another provider in my specialty: yes      MDM   34 year old female with a history of IV drug use presenting with acute on chronic back pain that significantly worsened 2 days ago.  She had a fever at home 2 days ago, but has otherwise been afebrile.  Patient received at signout from [facility] pending MRI .   Please see her note for further work-up and medical decision making.  MRI concerning for septic arthritis at the L4-L5 facets with mild epidural in paraspinous soft muscle tissue.  No frank epidural or soft tissue abscess.  On my initial exam, she was endorsing tenderness in the right hip, but there was no focal tenderness on my exam and she had full passive range of motion without pain.  Patient felt warm to the touch and repeat temperature 101.2.  Tylenol given.  We will also order Lidoderm patch and Flexeril.  Will order as needed Percocet as she has previously had Dilaudid and fentanyl.  Pain has been difficult to control given her history of IV drug use.   Aside from fever that began tonight, patient does not meet sepsis criteria.  Consulted neurosurgery and spoke with Dr. Maisie Fus who recommends initiating broad-spectrum antibiotics and blood cultures, which were drawn previously.  No further indication for neurosurgery to follow the patient.  He recommends if there is concern about narrowing antibiotics and speciation that an ID consult may be indicated or if the area needs to be aspirated then a consult to IR would be indicated.  Neurosurgery will sign off at this time.  Consult appreciated.  Spoke with Dr. Toniann Fail, hospitalist, who will accept the patient for admission. The patient appears reasonably stabilized for admission considering the current resources, flow, and capabilities available in the ED at this time, and I doubt any other [facility] requiring further screening and/or treatment in the ED prior to admission.         Frederik Pear A, PA-C 07/29/19 0135    Zadie Rhine, MD 07/30/19 [phone]

## 2019-07-28 NOTE — ED Triage Notes (Signed)
Pt states her right lower back has been hurting for 24 hours. Hurts to sit, stand, lay. Denies any fall/injury. Denies any urinary symptoms.

## 2019-07-28 NOTE — ED Notes (Signed)
Pt ambulatory to and from [facility]. Urine collected, labeled with 2 pt identifiers, and sent to lab 

## 2019-07-28 NOTE — ED Notes (Signed)
Received call from MRI that pt is unable to tolerate lying flat due to pain. Robaxin and fentanyl given per MAR. Name/DOB verified with pt

## 2019-07-28 NOTE — ED Provider Notes (Signed)
[facility] EMERGENCY DEPARTMENT Provider Note   CSN: [id number] Arrival date & time: 07/28/19  1549     History No chief complaint on file.   Latoya [name] is a 34 y.o. female.  HPI  34 year old female with an extensive history of heroin abuse, multiple trips to the ER for cellulitis/abscesses on her legs presents to the ER for gradual onset of right low back pain which started 2 days ago.  She reports lifting boxes in her home, but does not recall an episode of trauma or sudden onset of pain.  She woke up last night with severe pain that prevented her from getting up out of the bed.  The pain is continued to get worse.  She reports 10 out of 10 pain that is almost caused syncope upon getting up off the toilet.  Also endorses muscle spasms in her right lower back.  She has tried hot baths, Epson salt baths, BC powder, cold compress with no relief.  Reports difficulty walking, cannot get out of her car.  Reports fever last night of 101 along with chills which has now resolved.  She has never had pain like this before.  Nothing seems to alleviate her pain, pain worse on movement.  She denies loss of bowel and bladder, numbness, weakness, dysuria, hematuria, frequency, fever, flank pain.  No history of kidney stones.  History provided by the patient.    Past Medical History:  Diagnosis Date  . Heroin addiction (HCC)   . IV drug abuse [facility])     Patient Active Problem List   Diagnosis Date Noted  . Opioid use disorder (HCC) 04/05/2019  . HCV antibody positive 04/05/2019  . Cellulitis and abscess of right leg 04/04/2019  . Anemia of infection and chronic disease 01/10/2019    History reviewed. No pertinent surgical history.   OB History   No obstetric history on file.     History reviewed. No pertinent family history.  Social History   Tobacco Use  . Smoking status: Light Tobacco Smoker  . Smokeless tobacco: Never Used  Substance Use Topics  . Alcohol  use: Yes  . Drug use: Yes    Comment: Heroin     Home Medications Prior to Admission medications   Medication Sig Start Date End Date Taking? Authorizing Provider  amoxicillin (AMOXIL) 500 MG capsule Take 1 capsule (500 mg total) by mouth 3 (three) times daily. Patient not taking: Reported on 06/03/2019 04/10/19   McDonald, Mia A, PA-C  lidocaine (XYLOCAINE) 2 % solution Use as directed 15 mLs in the mouth or throat every 3 (three) hours as needed for mouth pain. Patient not taking: Reported on 06/03/2019 04/10/19   McDonald, Pedro Earls A, PA-C  sulfamethoxazole-trimethoprim (BACTRIM DS) 800-160 MG tablet Take 1 tablet by mouth 2 (two) times daily. Patient not taking: Reported on 06/03/2019 04/07/19   Katherine Roan, MD    Allergies    Naltrexone  Review of Systems   Review of Systems  Constitutional: Positive for chills and fever. Negative for fatigue.  Respiratory: Negative for shortness of breath.   Cardiovascular: Negative for chest pain.  Gastrointestinal: Negative for abdominal pain, constipation, diarrhea, nausea and vomiting.  Genitourinary: Positive for flank pain. Negative for difficulty urinating, dysuria, pelvic pain and vaginal bleeding.  Musculoskeletal: Positive for back pain and gait problem. Negative for myalgias and neck stiffness.  Neurological: Positive for weakness. Negative for dizziness, syncope and numbness.  Psychiatric/Behavioral: Negative.   All other systems reviewed and are  negative.   Physical Exam Updated Vital Signs BP 106/76 (BP Location: Right Arm)   Pulse (!) 108   Temp 98.2 F (36.8 C) (Oral)   Resp 15   Ht 5\' 6"  (1.676 m)   Wt 74.8 kg   LMP 07/07/2019   SpO2 100%   BMI 26.63 kg/m   Physical Exam Vitals and nursing note reviewed.  Constitutional:      General: She is not in acute distress.    Appearance: She is well-developed.  HENT:     Head: Normocephalic and atraumatic.  Eyes:     Conjunctiva/sclera: Conjunctivae normal.    Cardiovascular:     Rate and Rhythm: Normal rate and regular rhythm.     Heart sounds: No murmur.  Pulmonary:     Effort: Pulmonary effort is normal. No respiratory distress.     Breath sounds: Normal breath sounds.  Chest:     Chest wall: No tenderness.  Abdominal:     General: Abdomen is flat.     Palpations: Abdomen is soft.     Tenderness: There is no abdominal tenderness. There is no right CVA tenderness or left CVA tenderness.  Musculoskeletal:        General: Tenderness present. No swelling or signs of injury.     Cervical back: Neck supple.     Right lower leg: No edema.     Left lower leg: No edema.     Comments: Midline L-spine tenderness to palpation on exam.  No paraspinal tenderness.  No obvious deformities, step-offs, crepitus.  No fluctuance/abscesses appreciated.  Noted a small wound over the left L-spine paraspinal muscles.  No erythema, swelling.  Skin:    General: Skin is warm and dry.     Findings: Bruising and lesion present.     Comments: Multiple wounds in different stages of healing on right lower leg.  Right inner thigh track marks/nodule appreciated on outpatient.  No erythema, swelling, drainage.  Mild bruising.  Neurological:     General: No focal deficit present.     Mental Status: She is alert and oriented to person, place, and time.     Comments: Wound to the left midline L-spine.  Psychiatric:        Mood and Affect: Mood normal.        Behavior: Behavior normal.     ED Results / Procedures / Treatments   Labs (all labs ordered are listed, but only abnormal results are displayed) Labs Reviewed  COMPREHENSIVE METABOLIC PANEL - Abnormal; Notable for the following components:      Result Value   Sodium 133 (*)    Chloride 96 (*)    Calcium 8.7 (*)    All other components within normal limits  CBC WITH DIFFERENTIAL/PLATELET - Abnormal; Notable for the following components:   RBC 5.29 (*)    MCV 78.8 (*)    MCH 23.6 (*)    RDW 16.8 (*)     Lymphs Abs 4.2 (*)    All other components within normal limits  URINALYSIS, ROUTINE W REFLEX MICROSCOPIC - Abnormal; Notable for the following components:   Leukocytes,Ua SMALL (*)    All other components within normal limits  RAPID URINE DRUG SCREEN, HOSP PERFORMED - Abnormal; Notable for the following components:   Opiates POSITIVE (*)    Amphetamines POSITIVE (*)    All other components within normal limits  CULTURE, BLOOD (ROUTINE X 2)  CULTURE, BLOOD (ROUTINE X 2)  LACTIC ACID, PLASMA  PREGNANCY,  URINE  LACTIC ACID, PLASMA    EKG None  Radiology No results found.  Procedures Procedures (including critical care time)  Medications Ordered in ED Medications  fentaNYL (SUBLIMAZE) injection 100 mcg (100 mcg Intravenous Given 07/28/19 1953)  methocarbamol (ROBAXIN) injection 1,000 mg (1,000 mg Intramuscular Given 07/28/19 2139)  LORazepam (ATIVAN) injection 2 mg (2 mg Intravenous Given 07/28/19 2059)  fentaNYL (SUBLIMAZE) injection 100 mcg (100 mcg Intravenous Given 07/28/19 2139)    ED Course  I have reviewed the triage vital signs and the nursing notes.  Pertinent labs & imaging results that were available during my care of the patient were reviewed by me and considered in my medical decision making (see chart for details).    MDM Rules/Calculators/A&P                     34 year old female with an extensive history of IV heroin drug abuse presents to the ER with gradual onset of low back pain x1 day. Patient reports fever at home last night of 101 but is afebrile on arrival to ER.  Mildly tachycardic, other vitals unremarkable.  Given patient's drug abuse history, it is important to rule out possible infection in her spine.  Will order MRI of spine w/&w/o contrast, labs per sepsis protocol to rule out infectious causes.  Pain treated with fentanyl, Robaxin for pain in the ER.  CBC and lactic not concerning for sepsis or signs of infection.  CMP shows no significant  electrolyte abnormalities, normal renal function.  UDS positive for opiates and amphetamines.  MRI of L-spine pending.  Pending normal MRI, I anticipate the patient will be stable for discharge but will need to rule out possible abscess/infection given the patient's extensive history of IV drug use.  Signed out to [employer].   Final Clinical Impression(s) / ED Diagnoses Final diagnoses:  None    Rx / DC Orders ED Discharge Orders    None       Leone Brand 07/28/19 2203    Eber Hong, MD 07/29/19 [phone]

## 2019-07-28 NOTE — ED Notes (Deleted)
Pt back from CT/xray.

## 2019-07-28 NOTE — ED Notes (Signed)
PIV initiated, 20G to LAC. IV flushes with 10 cc NS without s/s of infiltration. Positive blood return noted. Secured with tape and tegaderm. Labs and cultures drawn, labeled with 2 pt identifiers, and sent to lab Fentanyl given per [facility]. Name/DOB verified with pt

## 2019-07-28 NOTE — ED Notes (Signed)
Pt back from MRI 

## 2019-07-28 NOTE — ED Notes (Signed)
Ativan given per MAR. Name/DOB verified with pt. Pt taken to MRI

## 2019-07-29 ENCOUNTER — Encounter (HOSPITAL_COMMUNITY): Payer: Self-pay | Admitting: Internal Medicine

## 2019-07-29 ENCOUNTER — Inpatient Hospital Stay (HOSPITAL_COMMUNITY): Payer: Medicaid Other

## 2019-07-29 DIAGNOSIS — R531 Weakness: Secondary | ICD-10-CM | POA: Diagnosis not present

## 2019-07-29 DIAGNOSIS — Z20822 Contact with and (suspected) exposure to covid-19: Secondary | ICD-10-CM | POA: Diagnosis not present

## 2019-07-29 DIAGNOSIS — F1910 Other psychoactive substance abuse, uncomplicated: Secondary | ICD-10-CM | POA: Diagnosis not present

## 2019-07-29 DIAGNOSIS — B954 Other streptococcus as the cause of diseases classified elsewhere: Secondary | ICD-10-CM | POA: Diagnosis not present

## 2019-07-29 DIAGNOSIS — Z1624 Resistance to multiple antibiotics: Secondary | ICD-10-CM | POA: Diagnosis not present

## 2019-07-29 DIAGNOSIS — M4650 Other infective spondylopathies, site unspecified: Secondary | ICD-10-CM

## 2019-07-29 DIAGNOSIS — R768 Other specified abnormal immunological findings in serum: Secondary | ICD-10-CM | POA: Diagnosis not present

## 2019-07-29 DIAGNOSIS — E871 Hypo-osmolality and hyponatremia: Secondary | ICD-10-CM | POA: Diagnosis not present

## 2019-07-29 DIAGNOSIS — B999 Unspecified infectious disease: Secondary | ICD-10-CM | POA: Diagnosis not present

## 2019-07-29 DIAGNOSIS — M4646 Discitis, unspecified, lumbar region: Secondary | ICD-10-CM | POA: Diagnosis not present

## 2019-07-29 DIAGNOSIS — F17200 Nicotine dependence, unspecified, uncomplicated: Secondary | ICD-10-CM | POA: Diagnosis present

## 2019-07-29 DIAGNOSIS — F1199 Opioid use, unspecified with unspecified opioid-induced disorder: Secondary | ICD-10-CM | POA: Diagnosis not present

## 2019-07-29 DIAGNOSIS — M545 Low back pain: Secondary | ICD-10-CM | POA: Diagnosis not present

## 2019-07-29 DIAGNOSIS — A419 Sepsis, unspecified organism: Secondary | ICD-10-CM | POA: Insufficient documentation

## 2019-07-29 DIAGNOSIS — M009 Pyogenic arthritis, unspecified: Secondary | ICD-10-CM | POA: Diagnosis present

## 2019-07-29 DIAGNOSIS — M1288 Other specific arthropathies, not elsewhere classified, other specified site: Secondary | ICD-10-CM | POA: Diagnosis not present

## 2019-07-29 DIAGNOSIS — F1110 Opioid abuse, uncomplicated: Secondary | ICD-10-CM | POA: Diagnosis present

## 2019-07-29 DIAGNOSIS — Z590 Homelessness: Secondary | ICD-10-CM | POA: Diagnosis not present

## 2019-07-29 DIAGNOSIS — F329 Major depressive disorder, single episode, unspecified: Secondary | ICD-10-CM | POA: Diagnosis not present

## 2019-07-29 DIAGNOSIS — R262 Difficulty in walking, not elsewhere classified: Secondary | ICD-10-CM | POA: Diagnosis not present

## 2019-07-29 DIAGNOSIS — Z888 Allergy status to other drugs, medicaments and biological substances status: Secondary | ICD-10-CM | POA: Diagnosis not present

## 2019-07-29 DIAGNOSIS — Z8619 Personal history of other infectious and parasitic diseases: Secondary | ICD-10-CM | POA: Diagnosis not present

## 2019-07-29 DIAGNOSIS — Z8679 Personal history of other diseases of the circulatory system: Secondary | ICD-10-CM | POA: Diagnosis not present

## 2019-07-29 DIAGNOSIS — B9562 Methicillin resistant Staphylococcus aureus infection as the cause of diseases classified elsewhere: Secondary | ICD-10-CM | POA: Diagnosis not present

## 2019-07-29 DIAGNOSIS — L0291 Cutaneous abscess, unspecified: Secondary | ICD-10-CM | POA: Diagnosis not present

## 2019-07-29 DIAGNOSIS — B1920 Unspecified viral hepatitis C without hepatic coma: Secondary | ICD-10-CM | POA: Diagnosis not present

## 2019-07-29 DIAGNOSIS — R509 Fever, unspecified: Secondary | ICD-10-CM | POA: Diagnosis not present

## 2019-07-29 DIAGNOSIS — I071 Rheumatic tricuspid insufficiency: Secondary | ICD-10-CM | POA: Diagnosis not present

## 2019-07-29 DIAGNOSIS — I079 Rheumatic tricuspid valve disease, unspecified: Secondary | ICD-10-CM | POA: Insufficient documentation

## 2019-07-29 DIAGNOSIS — D638 Anemia in other chronic diseases classified elsewhere: Secondary | ICD-10-CM | POA: Diagnosis not present

## 2019-07-29 DIAGNOSIS — M47816 Spondylosis without myelopathy or radiculopathy, lumbar region: Secondary | ICD-10-CM | POA: Diagnosis not present

## 2019-07-29 DIAGNOSIS — E669 Obesity, unspecified: Secondary | ICD-10-CM | POA: Diagnosis not present

## 2019-07-29 DIAGNOSIS — Z6826 Body mass index (BMI) 26.0-26.9, adult: Secondary | ICD-10-CM | POA: Diagnosis not present

## 2019-07-29 DIAGNOSIS — R7881 Bacteremia: Secondary | ICD-10-CM | POA: Diagnosis not present

## 2019-07-29 HISTORY — PX: IR LUMBAR DISC ASPIRATION W/IMG GUIDE: IMG5306

## 2019-07-29 HISTORY — DX: Rheumatic tricuspid valve disease, unspecified: I07.9

## 2019-07-29 LAB — SARS CORONAVIRUS 2 (TAT 6-24 HRS): SARS Coronavirus 2: NEGATIVE

## 2019-07-29 IMAGING — XA IR FLUORO GUIDE NDL PLMT / BX
1 series · 2 of 2 positions shown · non-contrast
Comparison: none

INDICATION: Low back pain, fever. Bilateral L4-5 facet arthropathy on MRI.
Aspiration requested to rule out septic arthritis.

[Series 300: spine · 2 of 2 slices shown]
[im 1/2]
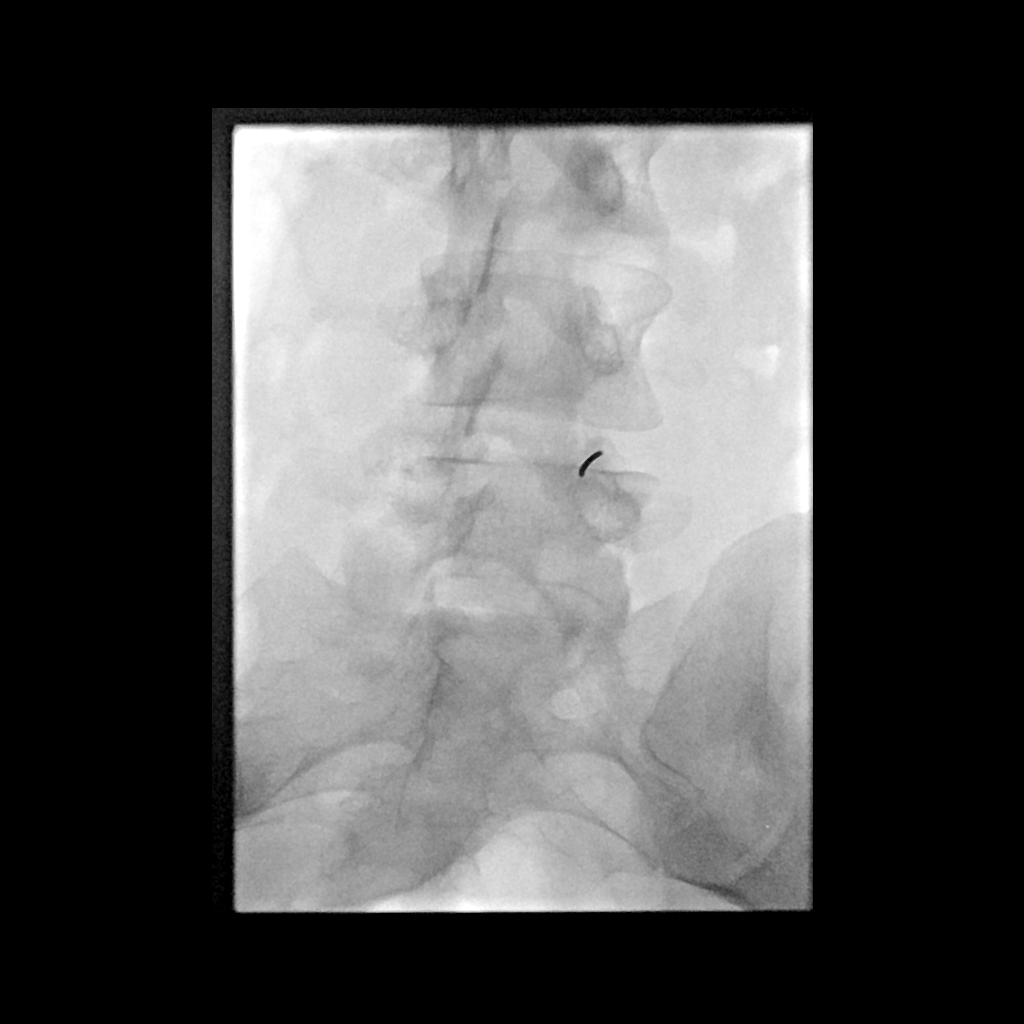
[im 2/2]
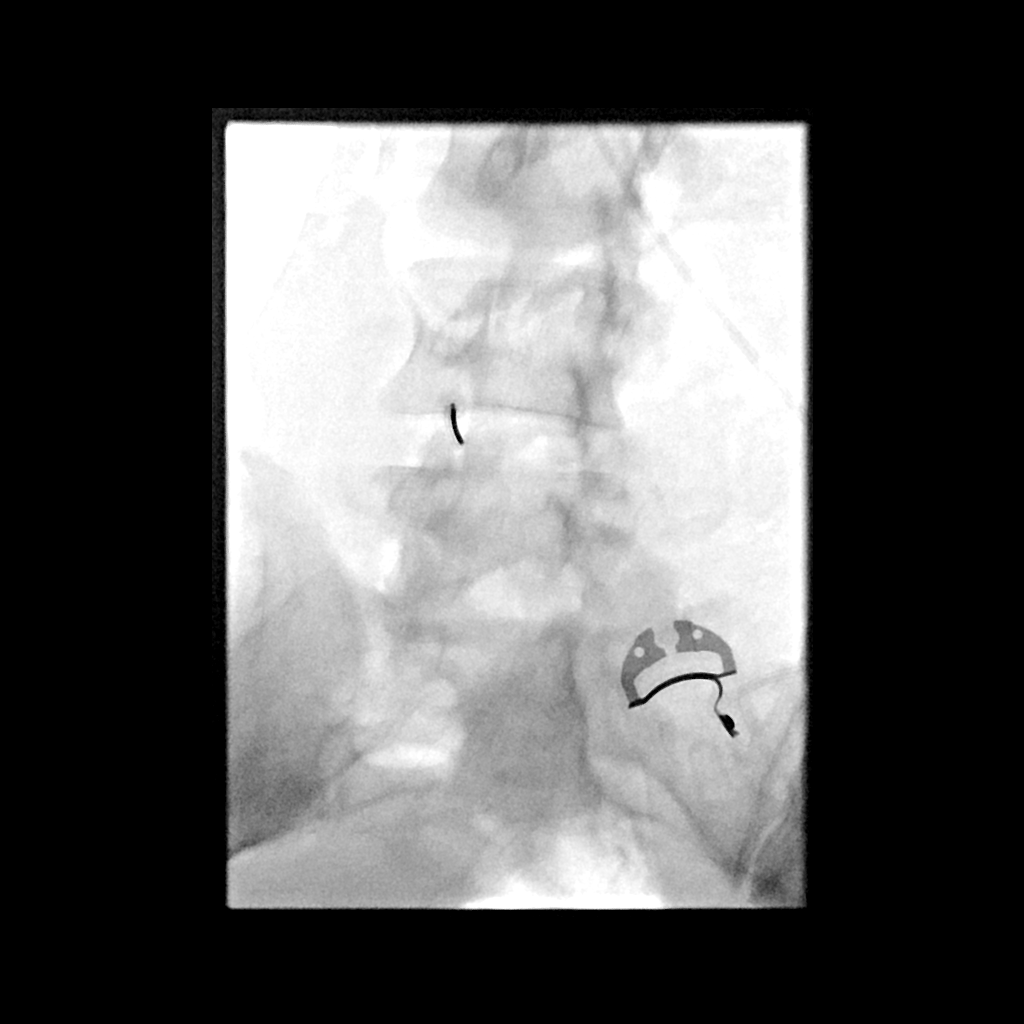

[2 of 2 positions shown; findings below may reference images not displayed]

EXAM:
RIGHT L4-5 FACET ASPIRATION UNDER FLUOROSCOPY

LEFT L4-5 FACET ASPIRATION UNDER FLUOROSCOPY

MEDICATIONS:
Lidocaine 1% subcutaneous

ANESTHESIA/SEDATION:
Intravenous Fentanyl [NM] and Versed 1mg were administered as
conscious sedation during continuous monitoring of the patient's
level of consciousness and physiological / cardiorespiratory status
by the radiology RN, with a total moderate sedation time of 10
minutes.

PROCEDURE:
Informed written consent was obtained from the patient after a
thorough discussion of the procedural risks, benefits and
alternatives. All questions were addressed. Maximal Sterile Barrier
Technique was utilized including caps, mask, sterile gowns, sterile
gloves, sterile drape, hand hygiene and skin antiseptic. A timeout
was performed prior to the initiation of the procedure.

The appropriate interspace was identified under fluoroscopy,
corresponding to previous cross-sectional imaging. With oblique
projection towards the right L4-5 facet, an appropriate skin entry
site was determined. After local infiltration with 1% lidocaine, a
18 gauge spinal needle was advanced into the posterior aspect of the
right L4-5 facet joint. Aspiration returned no fluid. Lavage with 1
mL nonbacteriostatic sterile saline was performed, the aspirate sent
for Gram stain and culture.

In similar fashion, With oblique projection towards the left L4-5
facet, an appropriate skin entry site was determined. After local
infiltration with 1% lidocaine, a 18 gauge spinal needle was
advanced into the posterior aspect of the left L4-5 facet joint.
Aspiration returned scant serosanguineous slightly cloudy fluid,
sent for Gram stain and culture.

The patient tolerated the procedure well.

FLUOROSCOPY TIME:  60 seconds; 49 mGy

COMPLICATIONS:
None immediate.
IMPRESSION: 1. Technically successful right L4-5 facet lavage and aspiration
under fluoroscopy.
2. Technically successful left L4-5 facet aspiration under
fluoroscopy.

## 2019-07-29 MED ORDER — CLONIDINE HCL 0.1 MG PO TABS
0.1000 mg | ORAL_TABLET | ORAL | Status: AC
  Administered 2019-07-31 – 2019-08-01 (×4): 0.1 mg via ORAL
  Filled 2019-07-29 (×4): qty 1

## 2019-07-29 MED ORDER — FENTANYL CITRATE (PF) 100 MCG/2ML IJ SOLN
INTRAMUSCULAR | Status: AC
  Filled 2019-07-29: qty 2

## 2019-07-29 MED ORDER — MIDAZOLAM HCL 2 MG/2ML IJ SOLN
INTRAMUSCULAR | Status: AC
  Filled 2019-07-29: qty 2

## 2019-07-29 MED ORDER — OXYCODONE HCL 5 MG PO TABS: 10.0000 mg | ORAL_TABLET | ORAL | Status: DC | PRN

## 2019-07-29 MED ORDER — VANCOMYCIN HCL 1500 MG/300ML IV SOLN
1500.0000 mg | Freq: Once | INTRAVENOUS | Status: AC
  Administered 2019-07-29: 1500 mg via INTRAVENOUS
  Filled 2019-07-29: qty 300

## 2019-07-29 MED ORDER — MIDAZOLAM HCL 2 MG/2ML IJ SOLN
INTRAMUSCULAR | Status: AC | PRN
  Administered 2019-07-29 (×2): 0.5 mg via INTRAVENOUS

## 2019-07-29 MED ORDER — CYCLOBENZAPRINE HCL 10 MG PO TABS
10.0000 mg | ORAL_TABLET | Freq: Once | ORAL | Status: AC
  Administered 2019-07-29: 10 mg via ORAL
  Filled 2019-07-29: qty 1

## 2019-07-29 MED ORDER — CLONIDINE HCL 0.1 MG PO TABS
0.1000 mg | ORAL_TABLET | Freq: Every day | ORAL | Status: DC
  Administered 2019-08-02: 0.1 mg via ORAL
  Filled 2019-07-29: qty 1

## 2019-07-29 MED ORDER — HYDROXYZINE HCL 25 MG PO TABS
25.0000 mg | ORAL_TABLET | Freq: Four times a day (QID) | ORAL | Status: DC | PRN
  Administered 2019-07-29 – 2019-07-31 (×7): 25 mg via ORAL
  Filled 2019-07-29 (×8): qty 1

## 2019-07-29 MED ORDER — ACETAMINOPHEN 325 MG PO TABS: 650.0000 mg | ORAL_TABLET | Freq: Four times a day (QID) | ORAL | Status: DC | PRN

## 2019-07-29 MED ORDER — LIDOCAINE 5 % EX PTCH
1.0000 | MEDICATED_PATCH | CUTANEOUS | Status: DC
  Administered 2019-07-29 (×2): 1 via TRANSDERMAL
  Filled 2019-07-29 (×3): qty 1

## 2019-07-29 MED ORDER — LOPERAMIDE HCL 2 MG PO CAPS: 2.0000 mg | ORAL_CAPSULE | ORAL | Status: DC | PRN

## 2019-07-29 MED ORDER — SODIUM CHLORIDE 0.9 % IV SOLN
2.0000 g | Freq: Three times a day (TID) | INTRAVENOUS | Status: DC
  Administered 2019-07-29 – 2019-07-31 (×8): 2 g via INTRAVENOUS
  Filled 2019-07-29 (×8): qty 2

## 2019-07-29 MED ORDER — LIDOCAINE HCL (PF) 1 % IJ SOLN
INTRAMUSCULAR | Status: AC
  Filled 2019-07-29: qty 30

## 2019-07-29 MED ORDER — ONDANSETRON HCL 4 MG PO TABS: 4.0000 mg | ORAL_TABLET | Freq: Four times a day (QID) | ORAL | Status: DC | PRN

## 2019-07-29 MED ORDER — VANCOMYCIN HCL 1250 MG/250ML IV SOLN
1250.0000 mg | Freq: Two times a day (BID) | INTRAVENOUS | Status: DC
  Administered 2019-07-29 – 2019-08-02 (×8): 1250 mg via INTRAVENOUS
  Filled 2019-07-29 (×9): qty 250

## 2019-07-29 MED ORDER — SODIUM CHLORIDE 0.9 % IV SOLN: INTRAVENOUS | Status: DC

## 2019-07-29 MED ORDER — ACETAMINOPHEN 650 MG RE SUPP: 650.0000 mg | Freq: Four times a day (QID) | RECTAL | Status: DC | PRN

## 2019-07-29 MED ORDER — ONDANSETRON 4 MG PO TBDP: 4.0000 mg | ORAL_TABLET | Freq: Four times a day (QID) | ORAL | Status: DC | PRN

## 2019-07-29 MED ORDER — FENTANYL CITRATE (PF) 100 MCG/2ML IJ SOLN
INTRAMUSCULAR | Status: AC | PRN
  Administered 2019-07-29: 75 ug via INTRAVENOUS
  Administered 2019-07-29 (×2): 25 ug via INTRAVENOUS

## 2019-07-29 MED ORDER — ONDANSETRON HCL 4 MG/2ML IJ SOLN: 4.0000 mg | Freq: Four times a day (QID) | INTRAMUSCULAR | Status: DC | PRN

## 2019-07-29 MED ORDER — SODIUM CHLORIDE 0.9 % IV BOLUS
1000.0000 mL | Freq: Once | INTRAVENOUS | Status: AC
  Administered 2019-07-29: 1000 mL via INTRAVENOUS

## 2019-07-29 MED ORDER — OXYCODONE HCL 5 MG PO TABS
10.0000 mg | ORAL_TABLET | ORAL | Status: DC | PRN
  Administered 2019-07-29: 10 mg via ORAL
  Filled 2019-07-29: qty 2

## 2019-07-29 MED ORDER — ACETAMINOPHEN 500 MG PO TABS
1000.0000 mg | ORAL_TABLET | Freq: Once | ORAL | Status: AC
  Administered 2019-07-29: 1000 mg via ORAL
  Filled 2019-07-29: qty 2

## 2019-07-29 MED ORDER — METHOCARBAMOL 500 MG PO TABS
500.0000 mg | ORAL_TABLET | Freq: Three times a day (TID) | ORAL | Status: DC | PRN
  Administered 2019-07-29 – 2019-07-31 (×4): 500 mg via ORAL
  Filled 2019-07-29 (×4): qty 1

## 2019-07-29 MED ORDER — DICYCLOMINE HCL 20 MG PO TABS
20.0000 mg | ORAL_TABLET | Freq: Four times a day (QID) | ORAL | Status: DC | PRN
  Filled 2019-07-29: qty 1

## 2019-07-29 MED ORDER — CLONIDINE HCL 0.1 MG PO TABS
0.1000 mg | ORAL_TABLET | Freq: Four times a day (QID) | ORAL | Status: AC
  Administered 2019-07-29 – 2019-07-30 (×8): 0.1 mg via ORAL
  Filled 2019-07-29 (×8): qty 1

## 2019-07-29 MED ORDER — KETOROLAC TROMETHAMINE 30 MG/ML IJ SOLN
30.0000 mg | Freq: Four times a day (QID) | INTRAMUSCULAR | Status: DC | PRN
  Administered 2019-07-29 – 2019-08-01 (×9): 30 mg via INTRAVENOUS
  Filled 2019-07-29 (×15): qty 1

## 2019-07-29 NOTE — ED Notes (Signed)
Care endorsed to Hilary, RN 

## 2019-07-29 NOTE — Progress Notes (Signed)
PHARMACY - PHYSICIAN COMMUNICATION CRITICAL VALUE ALERT - BLOOD CULTURE IDENTIFICATION (BCID)  Latoya [name] is an 34 y.o. female who presented to [facility] on 07/28/2019 with a chief complaint of back pain  Assessment:  Pt with a hx of IVDU who presented with back pain. MRI is concerning for diskitis. Lab called with blood culture result of 1/4 bottles with GPC chains. This could possibly be strep or enterococcus. Currently on vanc/cefepime. She will likely be seen ID.   Name of physician (or Provider) Contacted: Dr Rhona Leavens  Current antibiotics: Vanc/cefepime  Changes to prescribed antibiotics recommended:  Cont same abx for now  Ulyses Southward, PharmD, [place], AAHIVP, CPP Infectious Disease Pharmacist 07/29/2019 10:13 AM

## 2019-07-29 NOTE — Progress Notes (Signed)
Pharmacy Antibiotic Note  Latoya [name] is a 34 y.o. female admitted on 07/28/2019 with septic joint.  Pharmacy has been consulted for vancomycin and cefepime dosing.  Plan: Cefepime 2gm IV q8 hours Vancomycin 2gm IV x 1 then  1250 mg IV q12 hours F/u renal function, cultures and clinical course Height: 5\' 6"  (167.6 cm) Weight: 165 lb (74.8 kg) IBW/kg (Calculated) : 59.3  Temp (24hrs), Avg:99.7 F (37.6 C), Min:98.2 F (36.8 C), Max:101.2 F (38.4 C)  Recent Labs  Lab 07/28/19 1930 07/28/19 1935  WBC 8.0  --   CREATININE 0.72  --   LATICACIDVEN  --  1.5    Estimated Creatinine Clearance: 104.4 mL/min (by C-G formula based on SCr of 0.72 mg/dL).    Allergies  Allergen Reactions  . Naltrexone Anxiety    Severe anxiety, restlessness, vomiting    Thank you for allowing pharmacy to be a part of this patient's care.  07/30/19 [place] 07/29/2019 1:41 AM

## 2019-07-29 NOTE — Procedures (Signed)
  Procedure: FLuoro aspiration R L4-5 and L L4-5 facet joints   EBL:   minimal Complications:  none immediate  See full dictation in [employer].  Thora Lance MD Main # [phone] Pager  [phone]

## 2019-07-29 NOTE — H&P (Addendum)
Chief Complaint: Back pain  Referring Physician(s): Eduard Clos, MD  Supervising Physician: Oley Balm  Patient Status: [facility] - In-[facility]  History of Present Illness: Latoya [name] is a 34 y.o. female IV drug user who presented to the ED c/o worsening back pain.  She also reports fever/chills.  In the ED her temp was 101.2 and she was tachycardic.  WBC 8.0.  MRI showed = Reactive marrow edema and enhancement about the right greater than left L4-5 facets. While these findings may in part be degenerative in nature, appearance is concerning for possible acute septic arthritis given provided history. Associated mild epidural and paraspinous soft tissue enhancement without frank epidural or soft tissue abscess.  We are asked to perform a biopsy/facet aspiration.  She last ate at 6:30 am.  During my visit she was moaning and complaining and asking for pain medications. She stated she was withdrawing.  Past Medical History:  Diagnosis Date   Heroin addiction (HCC)    IV drug abuse (HCC)     History reviewed. No pertinent surgical history.  Allergies: Naltrexone  Medications: Prior to Admission medications   Medication Sig Start Date End Date Taking? Authorizing Provider  amoxicillin (AMOXIL) 500 MG capsule Take 1 capsule (500 mg total) by mouth 3 (three) times daily. Patient not taking: Reported on 06/03/2019 04/10/19   McDonald, Mia A, PA-C  lidocaine (XYLOCAINE) 2 % solution Use as directed 15 mLs in the mouth or throat every 3 (three) hours as needed for mouth pain. Patient not taking: Reported on 06/03/2019 04/10/19   McDonald, Pedro Earls A, PA-C  sulfamethoxazole-trimethoprim (BACTRIM DS) 800-160 MG tablet Take 1 tablet by mouth 2 (two) times daily. Patient not taking: Reported on 06/03/2019 04/07/19   Katherine Roan, MD     Family History  Family history unknown: Yes    Social History   Socioeconomic History   Marital status: Single    Spouse  name: Not on file   Number of children: Not on file   Years of education: Not on file   Highest education level: Not on file  Occupational History   Not on file  Tobacco Use   Smoking status: Light Tobacco Smoker   Smokeless tobacco: Never Used  Substance and Sexual Activity   Alcohol use: Yes   Drug use: Yes    Comment: Heroin    Sexual activity: Yes  Other Topics Concern   Not on file  Social History Narrative   Not on file   Social Determinants of Health   Financial Resource Strain:    Difficulty of Paying Living Expenses:   Food Insecurity:    Worried About Programme researcher, broadcasting/film/video in the Last Year:    Barista in the Last Year:   Transportation Needs:    Freight forwarder (Medical):    Lack of Transportation (Non-Medical):   Physical Activity:    Days of Exercise per Week:    Minutes of Exercise per Session:   Stress:    Feeling of Stress :   Social Connections:    Frequency of Communication with Friends and Family:    Frequency of Social Gatherings with Friends and Family:    Attends Religious Services:    Active Member of Clubs or Organizations:    Attends Banker Meetings:    Marital Status:      Review of Systems: A 12 point ROS discussed and pertinent positives are indicated in the HPI above.  All other systems are negative.  Review of Systems  Vital Signs: BP 102/70 (BP Location: Left Arm)    Pulse (!) 107    Temp 98 F (36.7 C) (Oral)    Resp 17    Ht 5\' 6"  (1.676 m)    Wt 74.8 kg    LMP 07/07/2019    SpO2 94%    BMI 26.63 kg/m   Physical Exam Vitals reviewed.  Constitutional:      Appearance: She is obese.     Comments: Moaning and c/o pain in her back.  HENT:     Head: Normocephalic and atraumatic.  Eyes:     Extraocular Movements: Extraocular movements intact.  Cardiovascular:     Rate and Rhythm: Normal rate and regular rhythm.  Pulmonary:     Effort: Pulmonary effort is normal. No  respiratory distress.     Breath sounds: Normal breath sounds.  Abdominal:     General: There is no distension.     Palpations: Abdomen is soft.     Tenderness: There is no abdominal tenderness.  Musculoskeletal:        General: Normal range of motion.  Skin:    General: Skin is warm and dry.  Neurological:     General: No focal deficit present.     Mental Status: She is alert and oriented to person, place, and time.  Psychiatric:        Mood and Affect: Mood normal.        Behavior: Behavior normal.        Thought Content: Thought content normal.        Judgment: Judgment normal.     Imaging: MR Lumbar Spine W Wo Contrast  Result Date: 07/28/2019 CLINICAL DATA:  Initial evaluation for acute fever and back pain. History of IV drug abuse. EXAM: MRI LUMBAR SPINE WITHOUT AND WITH CONTRAST TECHNIQUE: Multiplanar and multiecho pulse sequences of the lumbar spine were obtained without and with intravenous contrast. CONTRAST:  39mL GADAVIST GADOBUTROL 1 MMOL/ML IV SOLN COMPARISON:  Prior CT from 01/13/2019 FINDINGS: Segmentation: Standard. Lowest well-formed disc space labeled the L5-S1 level. Alignment: Levoscoliosis. Alignment otherwise normal with preservation of the normal lumbar lordosis. No listhesis. Vertebrae: Vertebral body height maintained without evidence for acute or chronic fracture. Bone marrow signal intensity diffusely decreased on T1 weighted imaging, nonspecific, but most commonly related to anemia, smoking, or obesity. Mild reactive marrow edema seen about the right greater than left L4-5 facets bilaterally. Associated trace bilateral joint effusions. While these findings could be degenerative in nature, possible septic arthritis could also be considered given the provided history. No other abnormal marrow edema or enhancement to suggest osteomyelitis or discitis. Subcentimeter benign hemangioma noted within the L3 vertebral body. No other discrete or worrisome osseous lesions.  Conus medullaris and cauda equina: Conus extends to the L1 level. Conus and cauda equina appear normal. Mild epidural enhancement seen adjacent to the L4-5 facets without frank epidural abscess or collection (series 18, image 11). No other evidence for acute intracanalicular infection. Paraspinal and other soft tissues: Soft tissue edema and enhancement seen within the right greater than left posterior paraspinous soft tissues adjacent to the L4-5 facets. No discrete soft tissue abscess or drainable fluid collection seen on this motion degraded exam. Visualized soft tissues otherwise within normal limits. Partially visualized visceral structures unremarkable. Disc levels: L1-2:  Unremarkable. L2-3: Disc desiccation with mild annular disc bulge. Endplate Schmorl's node deformity. No stenosis or impingement. L3-4:  Minimal annular disc  bulge.  No stenosis or impingement. L4-5: Negative interspace. Bilateral facet hypertrophy with associated trace bilateral joint effusions. Resultant mild spinal stenosis. Foramina remain patent. L5-S1: Negative interspace. Mild right greater than left facet hypertrophy. No stenosis. IMPRESSION: 1. Reactive marrow edema and enhancement about the right greater than left L4-5 facets. While these findings may in part be degenerative in nature, appearance is concerning for possible acute septic arthritis given provided history. Associated mild epidural and paraspinous soft tissue enhancement without frank epidural or soft tissue abscess. 2. No other evidence for acute infection within the lumbar spine. Electronically Signed   By: Rise Mu M.D.   On: 07/28/2019 22:42    Labs:  CBC: Recent Labs    04/06/19 0316 04/07/19 0425 06/02/19 2259 07/28/19 1930  WBC 6.9 6.9 11.5* 8.0  HGB 11.4* 11.7* 10.8* 12.5  HCT 36.7 37.9 35.1* 41.7  PLT 410* 399 337 207    COAGS: Recent Labs    03/29/19 1755  INR 1.1    BMP: Recent Labs    04/06/19 0316 04/07/19 0425  06/02/19 2259 07/28/19 1930  NA 140 142 134* 133*  K 3.5 3.8 3.9 3.9  CL 107 106 96* 96*  CO2 24 21* 26 25  GLUCOSE 102* 99 83 85  BUN 5* 6 11 6   CALCIUM 9.0 9.2 9.1 8.7*  CREATININE 0.60 0.64 0.74 0.72  GFRNONAA >60 >60 >60 >60  GFRAA >60 >60 >60 >60    LIVER FUNCTION TESTS: Recent Labs    04/06/19 0316 04/07/19 0425 06/02/19 2259 07/28/19 1930  BILITOT 0.4 0.4 0.9 0.7  AST 16 18 15 21   ALT 31 28 11 19   ALKPHOS 81 77 63 69  PROT 7.3 7.3 8.0 8.0  ALBUMIN 3.1* 3.2* 3.6 3.6    TUMOR MARKERS: No results for input(s): AFPTM, CEA, CA199, CHROMGRNA in the last 8760 hours.  Assessment and Plan:  Reactive marrow edema and enhancement about the right greater than left L4-5 facets concerning for possible acute septic arthritis and associated mild epidural and paraspinous soft tissue enhancement without frank epidural or soft tissue abscess.  Will proceed with facet aspiration /biopsy L 4-5 today by Dr. 07/30/19.  Risks and benefits of facet aspiration/biopsy was discussed with the patient and/or patient's family including, but not limited to bleeding, infection, damage to adjacent structures or low yield requiring additional tests.  All of the questions were answered and there is agreement to proceed.  Consent signed and in chart.  Thank you for this interesting consult.  I greatly enjoyed meeting Latoya [name] and look forward to participating in their care.  A copy of this report was sent to the requesting provider on this date.  Electronically Signed: , PA-C   07/29/2019, 11:37 AM      I spent a total of 40 Minutes in face to face in clinical consultation, greater than 50% of which was counseling/coordinating care for facet aspiration/biopsy.

## 2019-07-29 NOTE — Progress Notes (Signed)
PROGRESS NOTE    Latoya [name]  ZOX:[id number] DOB: February 08, 1986 DOA: 07/28/2019 PCP: Patient, No Pcp Per    Brief Narrative:  33yo with hx IV drug abuse with recent injection of heroin presented with fevers, sob, found to have L4-5 facet septic arthritis.  Assessment & Plan:   Principal Problem:   Sepsis (HCC) Active Problems:   Septic arthritis (HCC)   IV drug abuse (HCC)   1. L4-5 septic arthritis with sepsis present on admit 1. Thus far, 1/2 blood cx pos for gm pos in chains 2. IR was consulted and pt is now s/p tissue aspirate 3. Empiric vanc and cefepime was started on admit given septic picture 4. Will repeat CBC in AM 2. IV drug abuse 1. UDS pos for opiates, admitted to heroin prior to admit 2. Will continue patient on clonidine detox protocol for opioid withdraw 3. Will need cessation when more stable 3. Hyponatremia 1. Suspect secondary to sepsis  2. Will repeat BMET in AM  DVT prophylaxis: SCD's Code Status: Full Family Communication: Pt in room, family not at bedside Disposition Plan: From home, d/c planning pending decision on antibiotic choice pending cultures  Consultants:   IR  Procedures:   Lumbar aspirate 3/24  Antimicrobials: Anti-infectives (From admission, onward)   Start     Dose/Rate Route Frequency Ordered Stop   07/29/19 1430  vancomycin (VANCOREADY) IVPB 1250 mg/250 mL     1,250 mg 166.7 mL/hr over 90 Minutes Intravenous Every 12 hours 07/29/19 0941     07/29/19 0145  ceFEPIme (MAXIPIME) 2 g in sodium chloride 0.9 % 100 mL IVPB     2 g 200 mL/hr over 30 Minutes Intravenous Every 8 hours 07/29/19 0136     07/29/19 0145  vancomycin (VANCOREADY) IVPB 1500 mg/300 mL     1,500 mg 150 mL/hr over 120 Minutes Intravenous  Once 07/29/19 0136 07/29/19 0441       Subjective: Difficult to assess as pt currently sedated from analgesics  Objective: Vitals:   07/29/19 1235 07/29/19 1240 07/29/19 1245 07/29/19 1315  BP: 114/78 (!) 125/96  116/81 109/69  Pulse: (!) 101 (!) 108 94 94  Resp: 19 19 17 20   Temp:    99 F (37.2 C)  TempSrc:    Oral  SpO2: 100% 100% 100% 95%  Weight:      Height:        Intake/Output Summary (Last 24 hours) at 07/29/2019 1704 Last data filed at 07/29/2019 1500 Gross per 24 hour  Intake 1126.84 ml  Output --  Net 1126.84 ml   Filed Weights   07/28/19 1647  Weight: 74.8 kg    Examination:  General exam: Appears calm and comfortable  Respiratory system: Clear to auscultation. Respiratory effort normal. Cardiovascular system: S1 & S2 heard, Regular Gastrointestinal system: Abdomen is nondistended, soft and nontender. normal bowel sounds heard. Central nervous system: Asleep No focal neurological deficits. Extremities: Symmetric 5 x 5 power. Skin: No rashes, lesions  Psychiatry: Unable to assess given mentation  Data Reviewed: I have personally reviewed following labs and imaging studies  CBC: Recent Labs  Lab 07/28/19 1930  WBC 8.0  NEUTROABS 3.1  HGB 12.5  HCT 41.7  MCV 78.8*  PLT 207   Basic Metabolic Panel: Recent Labs  Lab 07/28/19 1930  NA 133*  K 3.9  CL 96*  CO2 25  GLUCOSE 85  BUN 6  CREATININE 0.72  CALCIUM 8.7*   GFR: Estimated Creatinine Clearance: 104.4 mL/min (by C-G formula  based on SCr of 0.72 mg/dL). Liver Function Tests: Recent Labs  Lab 07/28/19 1930  AST 21  ALT 19  ALKPHOS 69  BILITOT 0.7  PROT 8.0  ALBUMIN 3.6   No results for input(s): LIPASE, AMYLASE in the last 168 hours. No results for input(s): AMMONIA in the last 168 hours. Coagulation Profile: No results for input(s): INR, PROTIME in the last 168 hours. Cardiac Enzymes: No results for input(s): CKTOTAL, CKMB, CKMBINDEX, TROPONINI in the last 168 hours. BNP (last 3 results) No results for input(s): PROBNP in the last 8760 hours. HbA1C: No results for input(s): HGBA1C in the last 72 hours. CBG: No results for input(s): GLUCAP in the last 168 hours. Lipid Profile: No  results for input(s): CHOL, HDL, LDLCALC, TRIG, CHOLHDL, LDLDIRECT in the last 72 hours. Thyroid Function Tests: No results for input(s): TSH, T4TOTAL, FREET4, T3FREE, THYROIDAB in the last 72 hours. Anemia Panel: No results for input(s): VITAMINB12, FOLATE, FERRITIN, TIBC, IRON, RETICCTPCT in the last 72 hours. Sepsis Labs: Recent Labs  Lab 07/28/19 1935  LATICACIDVEN 1.5    Recent Results (from the past 240 hour(s))  Blood culture (routine x 2)     Status: None (Preliminary result)   Collection Time: 07/28/19  7:30 PM   Specimen: BLOOD RIGHT ARM  Result Value Ref Range Status   Specimen Description BLOOD RIGHT ARM  Final   Special Requests   Final    BOTTLES DRAWN AEROBIC AND ANAEROBIC Blood Culture adequate volume   Culture   Final    NO GROWTH < 12 HOURS Performed at [facility], [address] [place], [place] [address]    Report Status PENDING  Incomplete  Blood culture (routine x 2)     Status: None (Preliminary result)   Collection Time: 07/28/19  7:30 PM   Specimen: BLOOD LEFT ARM  Result Value Ref Range Status   Specimen Description BLOOD LEFT ARM  Final   Special Requests   Final    BOTTLES DRAWN AEROBIC AND ANAEROBIC Blood Culture adequate volume   Culture  Setup Time   Final    GRAM POSITIVE COCCI IN CHAINS ANAEROBIC BOTTLE ONLY CRITICAL RESULT CALLED TO, READ BACK BY AND VERIFIED WITH: PHARMD MINH P. [id number] Performed at [facility], [address] [place], [place] [address]    Culture GRAM POSITIVE COCCI  Final   Report Status PENDING  Incomplete  SARS CORONAVIRUS 2 (TAT 6-24 HRS) Nasopharyngeal Nasopharyngeal Swab     Status: None   Collection Time: 07/29/19 12:00 AM   Specimen: Nasopharyngeal Swab  Result Value Ref Range Status   SARS Coronavirus 2 NEGATIVE NEGATIVE Final    Comment: (NOTE) SARS-CoV-2 target nucleic acids are NOT DETECTED. The SARS-CoV-2 RNA is generally detectable in upper and lower respiratory specimens during the  acute phase of infection. Negative results do not preclude SARS-CoV-2 infection, do not rule out co-infections with other pathogens, and should not be used as the sole basis for treatment or other patient management decisions. Negative results must be combined with clinical observations, patient history, and epidemiological information. The expected result is Negative. Fact Sheet for Patients: HairSlick.no Fact Sheet for Healthcare Providers: quierodirigir.com This test is not yet approved or cleared by the [place] FDA and  has been authorized for detection and/or diagnosis of SARS-CoV-2 by FDA under an Emergency Use Authorization (EUA). This EUA will remain  in effect (meaning this test can be used) for the duration of the COVID-19  declaration under Section 56 4(b)(1) of the Act, 21 U.S.C. section 360bbb-3(b)(1), unless the authorization is terminated or revoked sooner. Performed at [facility], [address], [place], [place] [address]   Aerobic/Anaerobic Culture (surgical/deep wound)     Status: None (Preliminary result)   Collection Time: 07/29/19 12:54 PM   Specimen: Synovium; Synovial Fluid  Result Value Ref Range Status   Specimen Description SYNOVIAL FLUID  Final   Special Requests RIGHT L4 L5 FACET JOINT  Final   Gram Stain   Final    RARE WBC PRESENT,BOTH PMN AND MONONUCLEAR NO ORGANISMS SEEN Performed at [facility], [address], [place], [place] [address]    Culture PENDING  Incomplete   Report Status PENDING  Incomplete     Radiology Studies: MR Lumbar Spine W Wo Contrast  Result Date: 07/28/2019 CLINICAL DATA:  Initial evaluation for acute fever and back pain. History of IV drug abuse. EXAM: MRI LUMBAR SPINE WITHOUT AND WITH CONTRAST TECHNIQUE: Multiplanar and multiecho pulse sequences of the lumbar spine were obtained without and with intravenous contrast. CONTRAST:  23mL GADAVIST  GADOBUTROL 1 MMOL/ML IV SOLN COMPARISON:  Prior CT from 01/13/2019 FINDINGS: Segmentation: Standard. Lowest well-formed disc space labeled the L5-S1 level. Alignment: Levoscoliosis. Alignment otherwise normal with preservation of the normal lumbar lordosis. No listhesis. Vertebrae: Vertebral body height maintained without evidence for acute or chronic fracture. Bone marrow signal intensity diffusely decreased on T1 weighted imaging, nonspecific, but most commonly related to anemia, smoking, or obesity. Mild reactive marrow edema seen about the right greater than left L4-5 facets bilaterally. Associated trace bilateral joint effusions. While these findings could be degenerative in nature, possible septic arthritis could also be considered given the provided history. No other abnormal marrow edema or enhancement to suggest osteomyelitis or discitis. Subcentimeter benign hemangioma noted within the L3 vertebral body. No other discrete or worrisome osseous lesions. Conus medullaris and cauda equina: Conus extends to the L1 level. Conus and cauda equina appear normal. Mild epidural enhancement seen adjacent to the L4-5 facets without frank epidural abscess or collection (series 18, image 11). No other evidence for acute intracanalicular infection. Paraspinal and other soft tissues: Soft tissue edema and enhancement seen within the right greater than left posterior paraspinous soft tissues adjacent to the L4-5 facets. No discrete soft tissue abscess or drainable fluid collection seen on this motion degraded exam. Visualized soft tissues otherwise within normal limits. Partially visualized visceral structures unremarkable. Disc levels: L1-2:  Unremarkable. L2-3: Disc desiccation with mild annular disc bulge. Endplate Schmorl's node deformity. No stenosis or impingement. L3-4:  Minimal annular disc bulge.  No stenosis or impingement. L4-5: Negative interspace. Bilateral facet hypertrophy with associated trace bilateral  joint effusions. Resultant mild spinal stenosis. Foramina remain patent. L5-S1: Negative interspace. Mild right greater than left facet hypertrophy. No stenosis. IMPRESSION: 1. Reactive marrow edema and enhancement about the right greater than left L4-5 facets. While these findings may in part be degenerative in nature, appearance is concerning for possible acute septic arthritis given provided history. Associated mild epidural and paraspinous soft tissue enhancement without frank epidural or soft tissue abscess. 2. No other evidence for acute infection within the lumbar spine. Electronically Signed   By: Rise Mu M.D.   On: 07/28/2019 22:42    Scheduled Meds: . cloNIDine  0.1 mg Oral QID   Followed by  . [START ON 07/31/2019] cloNIDine  0.1 mg Oral BH-qamhs   Followed by  . [START ON 08/02/2019] cloNIDine  0.1 mg Oral QAC breakfast  . fentaNYL      . lidocaine  1 patch Transdermal Q24H  . lidocaine (PF)      . midazolam       Continuous Infusions: . sodium chloride 100 mL/hr at 07/29/19 0649  . ceFEPime (MAXIPIME) IV 2 g (07/29/19 1355)  . vancomycin 1,250 mg (07/29/19 1452)     LOS: 0 days   Marylu Lund, MD [facility] Pager On Amion  If 7PM-7AM, please contact night-coverage 07/29/2019, 5:04 PM

## 2019-07-29 NOTE — ED Notes (Signed)
meds given per [facility]. Name/DOB verified with pt Covid swab collected, labeled with 2 pt identifiers, and brought to lab

## 2019-07-29 NOTE — H&P (Signed)
History and Physical    Kanda Deluna FXT:[id number] DOB: 04-25-1986 DOA: 07/28/2019  PCP: Patient, No Pcp Per  Patient coming from: Home.  Chief Complaint: Back pain.  HPI: Latoya [name] is a 34 y.o. female with history of IV drug abuse admits to have injected herself about 3 days ago with heroin presents to the ER because of increasing low back pain over the last 2 days.  Subjective feeling of fever chills.  Denies any chest pain shortness of breath productive cough any diarrhea or dysuria.  Denies any incontinent of urine.  Pain is constant increased on walking.  ED Course: In the ER patient was febrile with temperature 101.2 F tachycardic CBC was unremarkable complete metabolic panel showed sodium of 133 lactic acid was normal.  MRI of the lumbar spine done shows right greater than left marrow edema concerning for L4-L5 facet septic arthritis.  ER physician discussed with on-call neurosurgeon Dr. Marcello Moores who at this time advised no neurosurgical procedure indicated but to get culture and start antibiotics and may consult interventional radiology and infectious disease.  Review of Systems: As per HPI, rest all negative.   Past Medical History:  Diagnosis Date  . Heroin addiction ([place])   . IV drug abuse ([place])     History reviewed. No pertinent surgical history.   reports that she has been smoking. She has never used smokeless tobacco. She reports current alcohol use. She reports current drug use.  Allergies  Allergen Reactions  . Naltrexone Anxiety    Severe anxiety, restlessness, vomiting    Family History  Family history unknown: Yes    Prior to Admission medications   Medication Sig Start Date End Date Taking? Authorizing Provider  amoxicillin (AMOXIL) 500 MG capsule Take 1 capsule (500 mg total) by mouth 3 (three) times daily. Patient not taking: Reported on 06/03/2019 04/10/19   McDonald, Mia A, PA-C  lidocaine (XYLOCAINE) 2 % solution Use as directed 15 mLs in the  mouth or throat every 3 (three) hours as needed for mouth pain. Patient not taking: Reported on 06/03/2019 04/10/19   McDonald, Maree Erie A, PA-C  sulfamethoxazole-trimethoprim (BACTRIM DS) 800-160 MG tablet Take 1 tablet by mouth 2 (two) times daily. Patient not taking: Reported on 06/03/2019 04/07/19   Jeanmarie Hubert, MD    Physical Exam: Constitutional: Moderately built and nourished. Vitals:   07/29/19 0315 07/29/19 0330 07/29/19 0345 07/29/19 0400  BP: 107/68 104/79 111/68 114/77  Pulse: (!) 107     Resp:      Temp:      TempSrc:      SpO2: 94%     Weight:      Height:       Eyes: Anicteric no pallor. ENMT: No discharge from the ears eyes nose or mouth. Neck: No mass felt.  No neck rigidity. Respiratory: No rhonchi or crepitations. Cardiovascular: S1-S2 heard. Abdomen: Soft nontender bowel sound present. Musculoskeletal: No edema. Skin: Skin scabs. Neurologic: Alert awake oriented to time place and person.  Moves all extremities. Psychiatric: Appears normal.   Labs on Admission: I have personally reviewed following labs and imaging studies  CBC: Recent Labs  Lab 07/28/19 1930  WBC 8.0  NEUTROABS 3.1  HGB 12.5  HCT 41.7  MCV 78.8*  PLT 299   Basic Metabolic Panel: Recent Labs  Lab 07/28/19 1930  NA 133*  K 3.9  CL 96*  CO2 25  GLUCOSE 85  BUN 6  CREATININE 0.72  CALCIUM 8.7*   GFR: Estimated  Creatinine Clearance: 104.4 mL/min (by C-G formula based on SCr of 0.72 mg/dL). Liver Function Tests: Recent Labs  Lab 07/28/19 1930  AST 21  ALT 19  ALKPHOS 69  BILITOT 0.7  PROT 8.0  ALBUMIN 3.6   No results for input(s): LIPASE, AMYLASE in the last 168 hours. No results for input(s): AMMONIA in the last 168 hours. Coagulation Profile: No results for input(s): INR, PROTIME in the last 168 hours. Cardiac Enzymes: No results for input(s): CKTOTAL, CKMB, CKMBINDEX, TROPONINI in the last 168 hours. BNP (last 3 results) No results for input(s): PROBNP in the  last 8760 hours. HbA1C: No results for input(s): HGBA1C in the last 72 hours. CBG: No results for input(s): GLUCAP in the last 168 hours. Lipid Profile: No results for input(s): CHOL, HDL, LDLCALC, TRIG, CHOLHDL, LDLDIRECT in the last 72 hours. Thyroid Function Tests: No results for input(s): TSH, T4TOTAL, FREET4, T3FREE, THYROIDAB in the last 72 hours. Anemia Panel: No results for input(s): VITAMINB12, FOLATE, FERRITIN, TIBC, IRON, RETICCTPCT in the last 72 hours. Urine analysis:    Component Value Date/Time   COLORURINE YELLOW 07/28/2019 1933   APPEARANCEUR CLEAR 07/28/2019 1933   LABSPEC 1.018 07/28/2019 1933   PHURINE 6.0 07/28/2019 1933   GLUCOSEU NEGATIVE 07/28/2019 1933   HGBUR NEGATIVE 07/28/2019 1933   BILIRUBINUR NEGATIVE 07/28/2019 1933   KETONESUR NEGATIVE 07/28/2019 1933   PROTEINUR NEGATIVE 07/28/2019 1933   NITRITE NEGATIVE 07/28/2019 1933   LEUKOCYTESUR SMALL (A) 07/28/2019 1933   Sepsis Labs: @LABRCNTIP (procalcitonin:4,lacticidven:4) )No results found for this or any previous visit (from the past 240 hour(s)).   Radiological Exams on Admission: MR Lumbar Spine W Wo Contrast  Result Date: 07/28/2019 CLINICAL DATA:  Initial evaluation for acute fever and back pain. History of IV drug abuse. EXAM: MRI LUMBAR SPINE WITHOUT AND WITH CONTRAST TECHNIQUE: Multiplanar and multiecho pulse sequences of the lumbar spine were obtained without and with intravenous contrast. CONTRAST:  43mL GADAVIST GADOBUTROL 1 MMOL/ML IV SOLN COMPARISON:  Prior CT from 01/13/2019 FINDINGS: Segmentation: Standard. Lowest well-formed disc space labeled the L5-S1 level. Alignment: Levoscoliosis. Alignment otherwise normal with preservation of the normal lumbar lordosis. No listhesis. Vertebrae: Vertebral body height maintained without evidence for acute or chronic fracture. Bone marrow signal intensity diffusely decreased on T1 weighted imaging, nonspecific, but most commonly related to anemia,  smoking, or obesity. Mild reactive marrow edema seen about the right greater than left L4-5 facets bilaterally. Associated trace bilateral joint effusions. While these findings could be degenerative in nature, possible septic arthritis could also be considered given the provided history. No other abnormal marrow edema or enhancement to suggest osteomyelitis or discitis. Subcentimeter benign hemangioma noted within the L3 vertebral body. No other discrete or worrisome osseous lesions. Conus medullaris and cauda equina: Conus extends to the L1 level. Conus and cauda equina appear normal. Mild epidural enhancement seen adjacent to the L4-5 facets without frank epidural abscess or collection (series 18, image 11). No other evidence for acute intracanalicular infection. Paraspinal and other soft tissues: Soft tissue edema and enhancement seen within the right greater than left posterior paraspinous soft tissues adjacent to the L4-5 facets. No discrete soft tissue abscess or drainable fluid collection seen on this motion degraded exam. Visualized soft tissues otherwise within normal limits. Partially visualized visceral structures unremarkable. Disc levels: L1-2:  Unremarkable. L2-3: Disc desiccation with mild annular disc bulge. Endplate Schmorl's node deformity. No stenosis or impingement. L3-4:  Minimal annular disc bulge.  No stenosis or impingement. L4-5: Negative interspace.  Bilateral facet hypertrophy with associated trace bilateral joint effusions. Resultant mild spinal stenosis. Foramina remain patent. L5-S1: Negative interspace. Mild right greater than left facet hypertrophy. No stenosis. IMPRESSION: 1. Reactive marrow edema and enhancement about the right greater than left L4-5 facets. While these findings may in part be degenerative in nature, appearance is concerning for possible acute septic arthritis given provided history. Associated mild epidural and paraspinous soft tissue enhancement without frank  epidural or soft tissue abscess. 2. No other evidence for acute infection within the lumbar spine. Electronically Signed   By: Rise Mu M.D.   On: 07/28/2019 22:42     Assessment/Plan Principal Problem:   Sepsis (HCC) Active Problems:   Septic arthritis (HCC)   IV drug abuse (HCC)    1. Sepsis secondary to L4-L5 facet joint septic arthritis for which ER physician had discussed with neurosurgeon Dr. Maisie Fus who advised starting antibiotics given the septic picture.  Follow blood cultures.  Consult infectious disease in the morning and may need interventional radiology consult for possible aspiration of the joint if possible. 2. IV drug abuse advised about quitting.  Social work consult.  Covid test is pending.  Given the septic nature of patient's presentation patient will need close monitoring for any further deterioration in inpatient status.   DVT prophylaxis: SCDs for now in anticipation of possible procedure required a lumbar spine area.  Avoiding anticoagulation for now. Code Status: Full code. Family Communication: Discussed with patient. Disposition Plan: To be determined. Consults called: ER physician discussed with neurosurgery. Admission status: Inpatient.   Eduard Clos MD [facility] Pager [phone].  If 7PM-7AM, please contact night-coverage [email or web address] Password [facility]  07/29/2019, 4:22 AM

## 2019-07-29 NOTE — Progress Notes (Signed)
Patient arrived to 5W from ED ~0500.  Patient was brought in a wheelchair and walked to the bed.  Alert and oriented x4.  A bit tearful/anxious.  VSS, heart rate slightly elevated.  Skin overall intact with some bruising and scabs on her legs.  Small scab on her lower back.  She refused a bath on arrival, said she would prefer to clean up later today.  Patient complained of 9/10 lower back pain.   Patient on mobile telemetry box [id number].  PIV access right AC, saline locked.  Patient informed on how/when to call for assistance.  Patient given some snacks and is resting in bed.

## 2019-07-29 NOTE — ED Notes (Signed)
Admitting MD at bedside.

## 2019-07-30 ENCOUNTER — Encounter (HOSPITAL_COMMUNITY): Payer: Self-pay | Admitting: Internal Medicine

## 2019-07-30 DIAGNOSIS — D638 Anemia in other chronic diseases classified elsewhere: Secondary | ICD-10-CM

## 2019-07-30 DIAGNOSIS — F1199 Opioid use, unspecified with unspecified opioid-induced disorder: Secondary | ICD-10-CM

## 2019-07-30 DIAGNOSIS — B999 Unspecified infectious disease: Secondary | ICD-10-CM

## 2019-07-30 LAB — COMPREHENSIVE METABOLIC PANEL
ALT: 15 U/L (ref 0–44)
AST: 14 U/L — ABNORMAL LOW (ref 15–41)
Albumin: 2.4 g/dL — ABNORMAL LOW (ref 3.5–5.0)
Alkaline Phosphatase: 50 U/L (ref 38–126)
Anion gap: 7 (ref 5–15)
BUN: 9 mg/dL (ref 6–20)
CO2: 24 mmol/L (ref 22–32)
Calcium: 8.1 mg/dL — ABNORMAL LOW (ref 8.9–10.3)
Chloride: 106 mmol/L (ref 98–111)
Creatinine, Ser: 0.51 mg/dL (ref 0.44–1.00)
GFR calc Af Amer: >60 mL/min (ref >60)
GFR calc non Af Amer: >60 mL/min (ref >60)
Glucose, Bld: 96 mg/dL (ref 70–99)
Potassium: 4.5 mmol/L (ref 3.5–5.1)
Sodium: 137 mmol/L (ref 135–145)
Total Bilirubin: 0.5 mg/dL (ref 0.3–1.2)
Total Protein: 5.8 g/dL — ABNORMAL LOW (ref 6.5–8.1)

## 2019-07-30 LAB — HCV RNA QUANT RFLX ULTRA OR GENOTYP
HCV RNA Qnt(log copy/mL): UNDETERMINED log10 IU/mL
HepC Qn: NOT DETECTED IU/mL

## 2019-07-30 LAB — CBC
HCT: 31 % — ABNORMAL LOW (ref 36.0–46.0)
Hemoglobin: 9.4 g/dL — ABNORMAL LOW (ref 12.0–15.0)
MCH: 23.2 pg — ABNORMAL LOW (ref 26.0–34.0)
MCHC: 30.3 g/dL (ref 30.0–36.0)
MCV: 76.5 fL — ABNORMAL LOW (ref 80.0–100.0)
Platelets: 173 10*3/uL (ref 150–400)
RBC: 4.05 MIL/uL (ref 3.87–5.11)
RDW: 16.5 % — ABNORMAL HIGH (ref 11.5–15.5)
WBC: 6.7 10*3/uL (ref 4.0–10.5)
nRBC: 0 % (ref 0.0–0.2)

## 2019-07-30 MED ORDER — PANTOPRAZOLE SODIUM 40 MG PO TBEC
40.0000 mg | DELAYED_RELEASE_TABLET | Freq: Every day | ORAL | Status: DC
  Administered 2019-07-30 – 2019-08-02 (×4): 40 mg via ORAL
  Filled 2019-07-30 (×4): qty 1

## 2019-07-30 MED ORDER — ACETAMINOPHEN 325 MG PO TABS
650.0000 mg | ORAL_TABLET | Freq: Four times a day (QID) | ORAL | Status: DC
  Administered 2019-07-30 – 2019-08-02 (×11): 650 mg via ORAL
  Filled 2019-07-30 (×11): qty 2

## 2019-07-30 NOTE — Progress Notes (Signed)
Patient slept for most of the early morning.  She endorsed pain 9/10 overnight, and I've given her PRN Toradol twice.  She was tearful around midnight in regards to her pains.  Patient called appropriately and ambulated well to the bathroom when necessary for stand-by assistance.  She has not had any other complaints, all else unremarkable overnight.

## 2019-07-30 NOTE — TOC Initial Note (Addendum)
Transition of Care [facility]) - Initial/Assessment Note    Patient Details  Name: Latoya [name] MRN: [id number] Date of Birth: 03-11-1986  Transition of Care [facility]) CM/SW Contact:    Ranelle Oyster, Student-Social Work Phone Number: 07/30/2019, 11:47 AM  Clinical Narrative:                 07/31/2018 CSW contacted pt via phone. CSW introduced self and reason for phone call. CSW asked pt if she had family supports at home and pt got very emotional. Pt declined any types of resources at this time. CSW told pt to call if she needed anything. SW will continue to follow as needed.    07/30/2018 CSW/LCSW attempted to make contact/assessment with pt, however, pt was sleeping. Will attempt assessment again later this afternoon.        Patient Goals and CMS Choice        Expected Discharge Plan and Services                                                Prior Living Arrangements/Services                       Activities of Daily Living      Permission Sought/Granted                  Emotional Assessment              Admission diagnosis:  Sepsis [facility]: [facility]onda W) [A41.9] Patient Active Problem List   Diagnosis Date Noted  . Sepsis (HCC) 07/29/2019  . Septic arthritis (HCC) 07/29/2019  . IV drug abuse (HCC) 07/29/2019  . Opioid use disorder (HCC) 04/05/2019  . HCV antibody positive 04/05/2019  . Cellulitis and abscess of right leg 04/04/2019  . Anemia of infection and chronic disease 01/10/2019   PCP:  Patient, No Pcp Per Pharmacy:   [id number] - [place], [place] - [address] AT [address] [place] [address] Phone: [phone] Fax: [phone]     Social Determinants of Health (SDOH) Interventions    Readmission Risk Interventions No flowsheet data found.

## 2019-07-30 NOTE — Progress Notes (Addendum)
PROGRESS NOTE    Latoya [name]  YQI:[id number] DOB: [birth year] DOA: 07/28/2019 PCP: Patient, No Pcp Per    Brief Narrative:  Patient was admitted to the hospital with working diagnosis of L4/5 septic arthritis.  34 year old female presented with back pain.  She does have significant past medical history for IV drug abuse, pneumonia and endocarditis.  She injected heroin intravenously about 3 days prior to hospitalization.  Reports worsening lower back pain for the last 48 hours, associated with fevers, chills and decreased mobility.  On her initial physical examination her temperature was 101.6 F, blood pressure 107/68, heart rate 107, oxygen saturation 94%, her lungs are clear to auscultation bilaterally, heart S1-S2 present rhythmic, soft abdomen, no lower extremity edema.  Sodium 133, potassium 3.9, chloride 96, bicarb 25, glucose 85, BUN 6, creatinine 0.72, white count 8.0, hemoglobin 12.5 hematocrit 41.7, platelets 207.  SARS COVID-19 negative.  Urinalysis negative for infection.  Drug screen positive for opiates and amphetamines.  Lumbar spine MRI with reactive marrow edema and enhancement L4-L5 facets.  No abscess.  Patient underwent fluoroscopy guided aspiration L4-L5, facet joints.  Assessment & Plan:   Principal Problem:   Septic arthritis (HCC) Active Problems:   Anemia of infection and chronic disease   Opioid use disorder (HCC)   HCV antibody positive   IV drug abuse (HCC)   1. L4-L5 septic arthritis. Cultures no growth yet, cell count is 6.7 and patient has remained afebrile.   Will continue with broad spectrum antibiotic therapy, will wait for final cultures. Continue pain control with ketorolac. Will change acetaminophen to scheduled dosing.   Will add pantoprazole for GI prophylaxis.   2. Hx of polysubstance abuse. No signs of acute withdrawal. Patient had pneumonia and endocarditis in the past.   3. Hep C. Will need outpatient follow up.   Ruled out sepsis due  to no end-organ damage.   DVT prophylaxis: enoxaparin   Code Status:  full Family Communication: no family at the bedside  Disposition Plan/ discharge barriers: patient from home, barrier for dc need for IV antibiotic therapy.    Antimicrobials:   Cefepime and Vancomycin IV    Subjective: Patient reports persistent back pain that is controlled with analgesics, no nausea or vomiting, no chest pain or dyspnea.   Objective: Vitals:   07/29/19 2124 07/29/19 2155 07/30/19 0500 07/30/19 0800  BP: 95/69 95/69 99/73    Pulse:  93 87 93  Resp:  19 18   Temp:  98.6 F (37 C) 98.5 F (36.9 C)   TempSrc:  Oral Oral   SpO2:  95% 97% 97%  Weight:      Height:        Intake/Output Summary (Last 24 hours) at 07/30/2019 1108 Last data filed at 07/30/2019 08/01/2019 Gross per 24 hour  Intake 2445.84 ml  Output --  Net 2445.84 ml   Filed Weights   07/28/19 1647  Weight: 74.8 kg    Examination:   General: Not in pain or dyspnea, deconditioned  Neurology: Awake and alert, non focal  E ENT: with mild pallor, no icterus, oral mucosa moist Cardiovascular: No JVD. S1-S2 present, rhythmic, no gallops, rubs, or murmurs. No lower extremity edema. Pulmonary: vesicular breath sounds bilaterally, adequate air movement, no wheezing, rhonchi or rales. Gastrointestinal. Abdomen with no organomegaly, non tender, no rebound or guarding Skin. No rashes Musculoskeletal: no joint deformities     Data Reviewed: I have personally reviewed following labs and imaging studies  CBC: Recent Labs  Lab  07/28/19 1930 07/30/19 0304  WBC 8.0 6.7  NEUTROABS 3.1  --   HGB 12.5 9.4*  HCT 41.7 31.0*  MCV 78.8* 76.5*  PLT 207 086   Basic Metabolic Panel: Recent Labs  Lab 07/28/19 1930 07/30/19 0304  NA 133* 137  K 3.9 4.5  CL 96* 106  CO2 25 24  GLUCOSE 85 96  BUN 6 9  CREATININE 0.72 0.51  CALCIUM 8.7* 8.1*   GFR: Estimated Creatinine Clearance: 104.4 mL/min (by C-G formula based on SCr of 0.51  mg/dL). Liver Function Tests: Recent Labs  Lab 07/28/19 1930 07/30/19 0304  AST 21 14*  ALT 19 15  ALKPHOS 69 50  BILITOT 0.7 0.5  PROT 8.0 5.8*  ALBUMIN 3.6 2.4*   No results for input(s): LIPASE, AMYLASE in the last 168 hours. No results for input(s): AMMONIA in the last 168 hours. Coagulation Profile: No results for input(s): INR, PROTIME in the last 168 hours. Cardiac Enzymes: No results for input(s): CKTOTAL, CKMB, CKMBINDEX, TROPONINI in the last 168 hours. BNP (last 3 results) No results for input(s): PROBNP in the last 8760 hours. HbA1C: No results for input(s): HGBA1C in the last 72 hours. CBG: No results for input(s): GLUCAP in the last 168 hours. Lipid Profile: No results for input(s): CHOL, HDL, LDLCALC, TRIG, CHOLHDL, LDLDIRECT in the last 72 hours. Thyroid Function Tests: No results for input(s): TSH, T4TOTAL, FREET4, T3FREE, THYROIDAB in the last 72 hours. Anemia Panel: No results for input(s): VITAMINB12, FOLATE, FERRITIN, TIBC, IRON, RETICCTPCT in the last 72 hours.    Radiology Studies: I have reviewed all of the imaging during this hospital visit personally     Scheduled Meds: . cloNIDine  0.1 mg Oral QID   Followed by  . [START ON 07/31/2019] cloNIDine  0.1 mg Oral BH-qamhs   Followed by  . [START ON 08/02/2019] cloNIDine  0.1 mg Oral QAC breakfast  . lidocaine  1 patch Transdermal Q24H   Continuous Infusions: . ceFEPime (MAXIPIME) IV 2 g (07/30/19 0532)  . vancomycin 1,250 mg (07/30/19 0320)     LOS: 1 day        Jerrel Tiberio Gerome Apley, MD

## 2019-07-31 ENCOUNTER — Inpatient Hospital Stay: Payer: Self-pay

## 2019-07-31 DIAGNOSIS — B9562 Methicillin resistant Staphylococcus aureus infection as the cause of diseases classified elsewhere: Secondary | ICD-10-CM

## 2019-07-31 DIAGNOSIS — M4646 Discitis, unspecified, lumbar region: Secondary | ICD-10-CM

## 2019-07-31 DIAGNOSIS — Z1624 Resistance to multiple antibiotics: Secondary | ICD-10-CM

## 2019-07-31 DIAGNOSIS — L0291 Cutaneous abscess, unspecified: Secondary | ICD-10-CM

## 2019-07-31 DIAGNOSIS — F1120 Opioid dependence, uncomplicated: Secondary | ICD-10-CM

## 2019-07-31 DIAGNOSIS — Z8619 Personal history of other infectious and parasitic diseases: Secondary | ICD-10-CM

## 2019-07-31 DIAGNOSIS — R768 Other specified abnormal immunological findings in serum: Secondary | ICD-10-CM

## 2019-07-31 DIAGNOSIS — R7881 Bacteremia: Secondary | ICD-10-CM

## 2019-07-31 DIAGNOSIS — Z8679 Personal history of other diseases of the circulatory system: Secondary | ICD-10-CM

## 2019-07-31 DIAGNOSIS — Z872 Personal history of diseases of the skin and subcutaneous tissue: Secondary | ICD-10-CM

## 2019-07-31 DIAGNOSIS — B954 Other streptococcus as the cause of diseases classified elsewhere: Secondary | ICD-10-CM

## 2019-07-31 DIAGNOSIS — Z720 Tobacco use: Secondary | ICD-10-CM

## 2019-07-31 DIAGNOSIS — F329 Major depressive disorder, single episode, unspecified: Secondary | ICD-10-CM

## 2019-07-31 DIAGNOSIS — Z888 Allergy status to other drugs, medicaments and biological substances status: Secondary | ICD-10-CM

## 2019-07-31 HISTORY — DX: Discitis, unspecified, lumbar region: M46.46

## 2019-07-31 LAB — CULTURE, BLOOD (ROUTINE X 2): Special Requests: ADEQUATE

## 2019-07-31 MED ORDER — IBUPROFEN 400 MG PO TABS
400.0000 mg | ORAL_TABLET | Freq: Three times a day (TID) | ORAL | Status: DC
  Administered 2019-07-31 – 2019-08-02 (×6): 400 mg via ORAL
  Filled 2019-07-31 (×6): qty 1

## 2019-07-31 NOTE — Consult Note (Signed)
[facility]    Date of Admission:  07/28/2019     Total days of antibiotics 3  Vancomycin                 Reason for Consult: Lumbar discitis    Referring Provider: Arrien  Primary Care Provider: Patient, No Pcp Per     Assessment: Latoya [name] is a 34 y.o. female with history of subacute back pain that has been worsening over the last few months now. She has a history of MSSA bacteremia secondary to tricuspid valve endocarditis in the past --> s/p AngioVac procedure. She completed 6 weeks of inpatient treatment for this infection with Cefazolin through 02/23/2019.   She has evidence of new discitis at L4-L5 with MRSA now. I think her viridans streptococcus bacteremia likely reflects a skin contaminant. Continue Vancomycin alone.   She has continued to inject heroin leading up to current hospitalization to prevent withdrawals. She is interested in anything we can help arrange for her to prevent withdrawals presently given her significantly painful condition of vertebral infection. Toradol seems to be helping back pain significantly, but unlikely able to use the whole time given we will need to continue long term vancomycin for treatment.  ?if social work can help to arrange follow up in Suboxone Clinic after discharge.   She is very tearful with recommendations to consider treatment in supervised setting for 6 weeks but at this time she thinks she can get through two weeks. If she is feeling better at this point we may be able to consider ongoing treatment with alternative option (PO long term vs IV Dalbavancin in outpatient infusion clinic).   Her hepatitis C Ab is positive indicating previous exposure but RNA is negative. She needs no further treatment but will be at ongoing risk for re-infection with ongoing drug use.  HIV non reactive.   History of endocarditis of tricuspid valve - would consider repeating surface echo now to re-evaluate  previously described moderate-severe regurgitation. No significant murmur on exam and no findings on exam/history concerning for ongoing heart failure. May need coordination of referral to cardiology locally.     Plan: 1. Ongoing treatment with Vancomycin --> She will likely be unable to stay whole duration   2. Opioid withdrawal management per primary team.  3. Appreciate any help with discharge planning for consideration of opioid replacement therapy 4. Would consider another TTE to re-evaluate her tricuspid valve     Principal Problem:   Lumbar discitis Active Problems:   Anemia of infection and chronic disease   Opioid use disorder (HCC)   HCV antibody positive   Septic arthritis (HCC)   IV drug abuse (HCC)   . acetaminophen  650 mg Oral Q6H  . cloNIDine  0.1 mg Oral BH-qamhs   Followed by  . [START ON 08/02/2019] cloNIDine  0.1 mg Oral QAC breakfast  . lidocaine  1 patch Transdermal Q24H  . pantoprazole  40 mg Oral Daily    HPI: Latoya [name] is a 34 y.o. female admitted from home with back pain. Past medical history includes previous positive hepatitis C Ab in the blood, opioid dependence, injection drug use with Heroin (last use 3 days prior to admission, 3/20).   She states she has been having back pain that has been escalating for well over 2-3 weeks now to the point where she couldn't tolerate it any longer. Sthe In the ER she was found to  be febrile 101.2 F, and tachycardic. No leukocytosis. Blood pressure was a bit soft in the low 90s, but this responded to IVF rehydration. MRI revealed L4-L5 septic arthritis with surrounding paraspinal abscesses. Dr. Maisie Fus on call with neurosurgery recommended to get cultures and start antibiotics. Blood cultures grew viridans streptococcus in 1/4 bottles. She was started on Vancomycin and Cefepime empirically. IR was consulted for aspiration at L4-5 and grew MRSA (R-cipro, clinda).   She has had several hospitalizations due to  various problems - September 2020 she was hospitalized at [facility] in [facility] --> initial complaint was fever and back pain, was also requesting detox at [facility]. She was found to have acute tricuspid valve endocarditis with blood cultures positive for MSSA. CT spine was done revealing no concern for paraspinal collection to suggest abscess. She was transferred to [facility] in [place] for successful tricuspid valve debulking with angiovac procedure 01/16/19 and transferred back to [facility] for ongoing care. She received Cefazolin 2 gm IV Q8h through 02/23/19. Hospitalization was complicated by anemia - GI performed EGD and colon w/o any concerning findings. She tapered off Subutex at this time with a goal to be non-dependent on substances. Failed ReVia injections (extreme anxiety). She was followed by psychiatry at that hospitalization.  Echo done prior to discharge 10/15 revealed moderate to severe tricuspid regurgitation with small residual vegetations. She has not followed up with cardiology or infectious disease since discharge.   She has been seen in the ER a few more times from November through January with cellulitis and skin abscesses. She was followed a bit by Dr. Ronelle Nigh and Dr. Oswaldo Done in the [facility] with methadone for pain control; planned to transition to Methadone clinic in December 2020  but trouble with reliable transportation to report daily to the clinic where she started using again due to withdrawal.  She states she has been injecting heroin for 3 years now and would really like to stop. She shoots up not to get high but just to "get by." Is homeless currently, spending time with various acquaintances.   Tearful discussing hepatitis C - she was informed about 3-4 months ago that she was positive for hepatitis C and scared to what this means for her health. She has not been worked up for this yet.     Review of Systems: Review of Systems  Constitutional:  Negative for chills, fever and malaise/fatigue.  Respiratory: Negative for cough.   Cardiovascular: Negative for chest pain and claudication.  Genitourinary: Negative for dysuria.  Musculoskeletal: Positive for back pain. Negative for joint pain and neck pain.  Skin: Positive for rash (multiple skin abscesses ).  Neurological: Negative for dizziness.  Psychiatric/Behavioral: Positive for depression and substance abuse.    Past Medical History:  Diagnosis Date  . Heroin addiction (HCC)   . IV drug abuse [facility])     Social History   Tobacco Use  . Smoking status: Light Tobacco Smoker    Types: Cigarettes  . Smokeless tobacco: Never Used  Substance Use Topics  . Alcohol use: Yes  . Drug use: Yes    Types: Heroin    Comment: Heroin     Family History  Family history unknown: Yes   Allergies  Allergen Reactions  . Naltrexone Anxiety    Severe anxiety, restlessness, vomiting    OBJECTIVE: Blood pressure 117/80, pulse 70, temperature 98 F (36.7 C), temperature source Oral, resp. rate 18, height 5\' 6"  (1.676 m), weight 74.8 kg,  last menstrual period 07/07/2019, SpO2 100 %.   Physical Exam Vitals and nursing note reviewed.  Constitutional:      Appearance: Normal appearance. She is not ill-appearing.  HENT:     Mouth/Throat:     Mouth: Mucous membranes are moist.     Pharynx: No oropharyngeal exudate.  Cardiovascular:     Rate and Rhythm: Normal rate and regular rhythm.     Heart sounds: No murmur.  Pulmonary:     Effort: Pulmonary effort is normal.  Abdominal:     General: Bowel sounds are normal. There is no distension.  Musculoskeletal:        General: No swelling.     Cervical back: Normal range of motion.     Lumbar back: Tenderness and bony tenderness present. No swelling or deformity. Decreased range of motion.  Skin:    General: Skin is warm and dry.     Capillary Refill: Capillary refill takes less than 2 seconds.     Comments: Multiple abrasions and  previous scars from abscesses and soft tissue infections   Neurological:     Mental Status: She is alert and oriented to person, place, and time.  Psychiatric:        Behavior: Behavior normal.     Comments: Tearful throughout conversation      Lab Results Lab Results  Component Value Date   WBC 6.7 07/30/2019   HGB 9.4 (L) 07/30/2019   HCT 31.0 (L) 07/30/2019   MCV 76.5 (L) 07/30/2019   PLT 173 07/30/2019    Lab Results  Component Value Date   CREATININE 0.51 07/30/2019   BUN 9 07/30/2019   NA 137 07/30/2019   K 4.5 07/30/2019   CL 106 07/30/2019   CO2 24 07/30/2019    Lab Results  Component Value Date   ALT 15 07/30/2019   AST 14 (L) 07/30/2019   ALKPHOS 50 07/30/2019   BILITOT 0.5 07/30/2019     Microbiology: Recent Results (from the past 240 hour(s))  Blood culture (routine x 2)     Status: None (Preliminary result)   Collection Time: 07/28/19  7:30 PM   Specimen: BLOOD RIGHT ARM  Result Value Ref Range Status   Specimen Description BLOOD RIGHT ARM  Final   Special Requests   Final    BOTTLES DRAWN AEROBIC AND ANAEROBIC Blood Culture adequate volume   Culture   Final    NO GROWTH 3 DAYS Performed at [facility], [address], [place], [place] [address]    Report Status PENDING  Incomplete  Blood culture (routine x 2)     Status: Abnormal   Collection Time: 07/28/19  7:30 PM   Specimen: BLOOD LEFT ARM  Result Value Ref Range Status   Specimen Description BLOOD LEFT ARM  Final   Special Requests   Final    BOTTLES DRAWN AEROBIC AND ANAEROBIC Blood Culture adequate volume   Culture  Setup Time   Final    GRAM POSITIVE COCCI IN CHAINS ANAEROBIC BOTTLE ONLY CRITICAL RESULT CALLED TO, READ BACK BY AND VERIFIED WITH: PHARMD Wheatland P. [phone]    Culture (A)  Final    VIRIDANS STREPTOCOCCUS THE SIGNIFICANCE OF ISOLATING THIS ORGANISM FROM A SINGLE SET OF BLOOD CULTURES WHEN MULTIPLE SETS ARE DRAWN IS UNCERTAIN. PLEASE NOTIFY THE MICROBIOLOGY  DEPARTMENT WITHIN ONE WEEK IF SPECIATION AND SENSITIVITIES ARE REQUIRED. Performed at [facility], [place] [address] [place], [place] [address]    Report  Status 07/31/2019 FINAL  Final  SARS CORONAVIRUS 2 (TAT 6-24 HRS) Nasopharyngeal Nasopharyngeal Swab     Status: None   Collection Time: 07/29/19 12:00 AM   Specimen: Nasopharyngeal Swab  Result Value Ref Range Status   SARS Coronavirus 2 NEGATIVE NEGATIVE Final    Comment: (NOTE) SARS-CoV-2 target nucleic acids are NOT DETECTED. The SARS-CoV-2 RNA is generally detectable in upper and lower respiratory specimens during the acute phase of infection. Negative results do not preclude SARS-CoV-2 infection, do not rule out co-infections with other pathogens, and should not be used as the sole basis for treatment or other patient management decisions. Negative results must be combined with clinical observations, patient history, and epidemiological information. The expected result is Negative. Fact Sheet for Patients: HairSlick.no Fact Sheet for Healthcare Providers: quierodirigir.com This test is not yet approved or cleared by the [place] FDA and  has been authorized for detection and/or diagnosis of SARS-CoV-2 by FDA under an Emergency Use Authorization (EUA). This EUA will remain  in effect (meaning this test can be used) for the duration of the COVID-19 declaration under Section 56 4(b)(1) of the Act, 21 U.S.C. section 360bbb-3(b)(1), unless the authorization is terminated or revoked sooner. Performed at [facility], [address] [place], [place] [address]   Aerobic/Anaerobic Culture (surgical/deep wound)     Status: None (Preliminary result)   Collection Time: 07/29/19 12:54 PM   Specimen: Synovium; Synovial Fluid  Result Value Ref Range Status   Specimen Description SYNOVIAL FLUID  Final   Special Requests RIGHT L4 L5 FACET JOINT  Final   Gram Stain    Final    RARE WBC PRESENT,BOTH PMN AND MONONUCLEAR NO ORGANISMS SEEN Performed at [facility], [address], [place], [place] [address]    Culture   Final    RARE METHICILLIN RESISTANT STAPHYLOCOCCUS AUREUS NO ANAEROBES ISOLATED; CULTURE IN PROGRESS FOR 5 DAYS    Report Status PENDING  Incomplete   Organism ID, Bacteria METHICILLIN RESISTANT STAPHYLOCOCCUS AUREUS  Final      Susceptibility   Methicillin resistant staphylococcus aureus - MIC*    CIPROFLOXACIN >=8 RESISTANT Resistant     ERYTHROMYCIN >=8 RESISTANT Resistant     GENTAMICIN <=0.5 SENSITIVE Sensitive     OXACILLIN >=4 RESISTANT Resistant     TETRACYCLINE <=1 SENSITIVE Sensitive     VANCOMYCIN 1 SENSITIVE Sensitive     TRIMETH/SULFA <=10 SENSITIVE Sensitive     CLINDAMYCIN >=8 RESISTANT Resistant     RIFAMPIN <=0.5 SENSITIVE Sensitive     Inducible Clindamycin NEGATIVE Sensitive     * RARE METHICILLIN RESISTANT STAPHYLOCOCCUS AUREUS  Body fluid culture     Status: None (Preliminary result)   Collection Time: 07/29/19 12:54 PM   Specimen: Synovium; Synovial Fluid  Result Value Ref Range Status   Specimen Description SYNOVIAL FLUID  Final   Special Requests LEFT L4 L5 FACET JOINT  Final   Gram Stain   Final    FEW WBC PRESENT, PREDOMINANTLY PMN NO ORGANISMS SEEN Performed at [facility], [address], [place], [place] [address]    Culture NO GROWTH 2 DAYS  Final   Report Status PENDING  Incomplete    Rexene Alberts, MSN, NP-C [facility]  [email or web address] Pager: [phone] Office: [phone] [facility] Main Line: [phone]

## 2019-07-31 NOTE — Progress Notes (Signed)
PROGRESS NOTE    Latoya [name]  YPP:[id number] DOB: May 12, 1985 DOA: 07/28/2019 PCP: Patient, No Pcp Per    Brief Narrative:  Patient was admitted to the hospital with working diagnosis of L4/5 septic arthritis.  34 year old female presented with back pain.  She does have significant past medical history for IV drug abuse, pneumonia and endocarditis.  She injected heroin intravenously about 3 days prior to hospitalization.  Reports worsening lower back pain for the last 48 hours, associated with fevers, chills and decreased mobility.  On her initial physical examination her temperature was 101.6 F, blood pressure 107/68, heart rate 107, oxygen saturation 94%, her lungs are clear to auscultation bilaterally, heart S1-S2 present rhythmic, soft abdomen, no lower extremity edema.  Sodium 133, potassium 3.9, chloride 96, bicarb 25, glucose 85, BUN 6, creatinine 0.72, white count 8.0, hemoglobin 12.5 hematocrit 41.7, platelets 207.  SARS COVID-19 negative.  Urinalysis negative for infection.  Drug screen positive for opiates and amphetamines.  Lumbar spine MRI with reactive marrow edema and enhancement L4-L5 facets.  No abscess.  Patient underwent fluoroscopy guided aspiration L4-L5, facet joints.   Assessment & Plan:   Principal Problem:   Septic arthritis ([place]) Active Problems:   Anemia of infection and chronic disease   Opioid use disorder (H[facility])   HCV antibody positive   IV drug abuse ([place])    1. L4-L5 septic arthritis/ MRSA present on admission. Culture positive for MRSA, patient has been afebrile.   Will continue with vancomycin and will dc cefepime, consulted Dr Carlos Levering from ID for further recommendation for duration of therapy and if she may need rifampin. Will continue pain control with IV ketorolac, and scheduled ibuprofen and  Acetaminophen. Continue with methocarbamol.   Continue with pantoprazole for GI prophylaxis.   2. Hx of polysubstance abuse. No acute withdrawal.  Continue neuro checks per unit protocol,. Out of bed to chair tid and physical therapy evaluation/ ot.Continue with clonidine and atarax.   3. Hep C. Outpatient follow up.   Ruled out sepsis due to no end-organ damage.   DVT prophylaxis: enoxaparin   Code Status:  full Family Communication: no family at the bedside  Disposition Plan/ discharge barriers: patient from home, barrier for dc need for IV antibiotic therapy.      Antimicrobials:   IV vancomycin     Subjective: Patient continue to have back pain that is improved with analgesics, no nausea or vomiting, she has not being ambulating. No chest pain,   Objective: Vitals:   07/30/19 0800 07/30/19 2014 07/31/19 0600 07/31/19 0900  BP:  108/73 117/80   Pulse: 93 80 76 70  Resp:      Temp:  98 F (36.7 C)    TempSrc:  Oral    SpO2: 97% 98% 99% 100%  Weight:      Height:        Intake/Output Summary (Last 24 hours) at 07/31/2019 1023 Last data filed at 07/30/2019 2300 Gross per 24 hour  Intake 222.13 ml  Output --  Net 222.13 ml   Filed Weights   07/28/19 1647  Weight: 74.8 kg    Examination:   General: Not in pain or dyspnea, deconditioned  Neurology: Awake and alert, non focal  E ENT: no pallor, no icterus, oral mucosa moist Cardiovascular: No JVD. S1-S2 present, rhythmic, no gallops, rubs, or murmurs. No lower extremity edema. Pulmonary: positive breath sounds bilaterally, adequate air movement, no wheezing, rhonchi or rales. Gastrointestinal. Abdomen fwith no organomegaly, non tender, no rebound  or guarding Skin. No rashes Musculoskeletal: no joint deformities     Data Reviewed: I have personally reviewed following labs and imaging studies  CBC: Recent Labs  Lab 07/28/19 1930 07/30/19 0304  WBC 8.0 6.7  NEUTROABS 3.1  --   HGB 12.5 9.4*  HCT 41.7 31.0*  MCV 78.8* 76.5*  PLT 207 173   Basic Metabolic Panel: Recent Labs  Lab 07/28/19 1930 07/30/19 0304  NA 133* 137  K 3.9 4.5  CL  96* 106  CO2 25 24  GLUCOSE 85 96  BUN 6 9  CREATININE 0.72 0.51  CALCIUM 8.7* 8.1*   GFR: Estimated Creatinine Clearance: 104.4 mL/min (by C-G formula based on SCr of 0.51 mg/dL). Liver Function Tests: Recent Labs  Lab 07/28/19 1930 07/30/19 0304  AST 21 14*  ALT 19 15  ALKPHOS 69 50  BILITOT 0.7 0.5  PROT 8.0 5.8*  ALBUMIN 3.6 2.4*   No results for input(s): LIPASE, AMYLASE in the last 168 hours. No results for input(s): AMMONIA in the last 168 hours. Coagulation Profile: No results for input(s): INR, PROTIME in the last 168 hours. Cardiac Enzymes: No results for input(s): CKTOTAL, CKMB, CKMBINDEX, TROPONINI in the last 168 hours. BNP (last 3 results) No results for input(s): PROBNP in the last 8760 hours. HbA1C: No results for input(s): HGBA1C in the last 72 hours. CBG: No results for input(s): GLUCAP in the last 168 hours. Lipid Profile: No results for input(s): CHOL, HDL, LDLCALC, TRIG, CHOLHDL, LDLDIRECT in the last 72 hours. Thyroid Function Tests: No results for input(s): TSH, T4TOTAL, FREET4, T3FREE, THYROIDAB in the last 72 hours. Anemia Panel: No results for input(s): VITAMINB12, FOLATE, FERRITIN, TIBC, IRON, RETICCTPCT in the last 72 hours.    Radiology Studies: I have reviewed all of the imaging during this hospital visit personally     Scheduled Meds: . acetaminophen  650 mg Oral Q6H  . cloNIDine  0.1 mg Oral BH-qamhs   Followed by  . [START ON 08/02/2019] cloNIDine  0.1 mg Oral QAC breakfast  . lidocaine  1 patch Transdermal Q24H  . pantoprazole  40 mg Oral Daily   Continuous Infusions: . ceFEPime (MAXIPIME) IV 2 g (07/31/19 0627)  . vancomycin 1,250 mg (07/31/19 0222)     LOS: 2 days        Champ Keetch Annett Gula, MD

## 2019-08-01 LAB — BODY FLUID CULTURE: Culture: NO GROWTH

## 2019-08-01 LAB — BASIC METABOLIC PANEL
Anion gap: 10 (ref 5–15)
BUN: 13 mg/dL (ref 6–20)
CO2: 23 mmol/L (ref 22–32)
Calcium: 8.4 mg/dL — ABNORMAL LOW (ref 8.9–10.3)
Chloride: 109 mmol/L (ref 98–111)
Creatinine, Ser: 0.6 mg/dL (ref 0.44–1.00)
GFR calc Af Amer: >60 mL/min (ref >60)
GFR calc non Af Amer: >60 mL/min (ref >60)
Glucose, Bld: 111 mg/dL — ABNORMAL HIGH (ref 70–99)
Potassium: 3.9 mmol/L (ref 3.5–5.1)
Sodium: 142 mmol/L (ref 135–145)

## 2019-08-01 MED ORDER — BUPRENORPHINE HCL-NALOXONE HCL 2-0.5 MG SL SUBL: 1.0000 | SUBLINGUAL_TABLET | SUBLINGUAL | Status: AC | PRN

## 2019-08-01 MED ORDER — BUPRENORPHINE HCL-NALOXONE HCL 8-2 MG SL SUBL: 1.0000 | SUBLINGUAL_TABLET | Freq: Two times a day (BID) | SUBLINGUAL | Status: DC

## 2019-08-01 MED ORDER — BUPRENORPHINE HCL-NALOXONE HCL 8-2 MG SL SUBL
1.0000 | SUBLINGUAL_TABLET | Freq: Two times a day (BID) | SUBLINGUAL | Status: DC
  Administered 2019-08-01 – 2019-08-02 (×3): 1 via SUBLINGUAL
  Filled 2019-08-01 (×3): qty 1

## 2019-08-01 NOTE — Progress Notes (Signed)
Spoke with Dr Ella Jubilee re PICC placement.  Will d/c PICC order until recommended at later time for IV access.  Currently has PIV working well for medication needs.

## 2019-08-01 NOTE — Progress Notes (Signed)
   08/01/19 1400  Clinical Encounter Type  Visited With Patient  Visit Type Spiritual support  Referral From Care management  Consult/Referral To Chaplain  Recommendations unit chaplain follow up monday  Spiritual Encounters  Spiritual Needs Emotional  Stress Factors  Patient Stress Factors Health changes;Exhausted;Other (Comment) (addiction )  Family Stress Factors Other (Comment) (lack of support)  Patient shared about her struggles with addiction and the recent medical setbacks she has experienced. Patient is feeling overwhelmed by her illness, and the difficulties she has had trying to remain clean. She wants to be there for her children, but is feeling overwhelmed and alone. Chaplain provided empathic listening and prayer. Patient appreciated both. Will refer to unit chaplain for follow up.   Sheppard Coil On Call Chaplain  Pager#: [phone]

## 2019-08-01 NOTE — Progress Notes (Signed)
Spoke with Fleet Contras RN re PICC order.   States pt is emotional.   Fleet Contras to follow up with pt and doctor re PICC need.  States the PIV is working well at last assessment.

## 2019-08-01 NOTE — Progress Notes (Signed)
PROGRESS NOTE    Latoya [name]  MBW:[id number] DOB: 1985-11-14 DOA: 07/28/2019 PCP: Patient, No Pcp Per    Brief Narrative:  Patient was admitted to the hospital with working diagnosis of L4/5 septic arthritis/ MRSA.  34 year old female presented with back pain.  She does have significant past medical history for IV drug abuse, pneumonia and endocarditis (tricuspid valve).  She injected heroin intravenously about 3 days prior to hospitalization.  Reports worsening lower back pain for the last 48 hours, associated with fevers, chills and decreased mobility.  On her initial physical examination her temperature was 101.6 F, blood pressure 107/68, heart rate 107, oxygen saturation 94%, her lungs are clear to auscultation bilaterally, heart S1-S2 present rhythmic, soft abdomen, no lower extremity edema.  Sodium 133, potassium 3.9, chloride 96, bicarb 25, glucose 85, BUN 6, creatinine 0.72, white count 8.0, hemoglobin 12.5 hematocrit 41.7, platelets 207.  SARS COVID-19 negative.  Urinalysis negative for infection.  Drug screen positive for opiates and amphetamines.  Lumbar spine MRI with reactive marrow edema and enhancement L4-L5 facets.  No abscess.  Patient underwent fluoroscopy guided aspiration L4-L5, facet joints. Aspirate culture positive for MRSA, narrowed antibiotic therapy to vancomycin. Will need 6 weeks per ID recommendations.    Assessment & Plan:   Principal Problem:   Lumbar discitis Active Problems:   Anemia of infection and chronic disease   Opioid use disorder (HCC)   HCV antibody positive   Septic arthritis ([place])   IV drug abuse ([place])   1. L4-L5 septic arthritis/ MRSA present on admission. Culture positive for MRSA.    Continue with vancomycin, she will need 6 weeks of antibiotic per ID recommendations. Will order pic line.    Pain control with as needed IV ketorolac, scheduled ibuprofen and  Acetaminophen. PRN methocarbamol. Will start patient with suboxone for  pain control and opioid abuse.   On pantoprazole for GI prophylaxis.   2. Hx of polysubstance abuse. Continue to encourge of bed to chair tid and physical therapy evaluation/ ot. On clonidine and atarax. Will start patient on bid suboxone for pain control and opioid abuse.   3. Hep C. She will need utpatient follow up.  Ruled out sepsis due to no end-organ damage.  DVT prophylaxis:enoxaparin Code Status:full Family Communication:no family at the bedside Disposition Plan/ discharge barriers:patient from home, barrier for dc need for IV antibiotic therapy.   Consultants:   ID   Procedures:     Antimicrobials:   vancomycin     Subjective: Patient continue to have back pain, no nausea or vomiting, no dyspnea or chest pain. Patient is willing to try suboxone, for pain control and opioid abuse.   Objective: Vitals:   07/31/19 2032 07/31/19 2141 08/01/19 0700 08/01/19 0817  BP: 95/62 92/61 (!) 85/59 98/69  Pulse: 80  77   Resp: 14  18   Temp: 99 F (37.2 C)  98.2 F (36.8 C)   TempSrc: Oral  Oral   SpO2: 96%  95%   Weight:      Height:        Intake/Output Summary (Last 24 hours) at 08/01/2019 1008 Last data filed at 08/01/2019 1000 Gross per 24 hour  Intake 1110 ml  Output --  Net 1110 ml   Filed Weights   07/28/19 1647  Weight: 74.8 kg    Examination:   General: Not in pain or dyspnea, deconditioned  Neurology: Awake and alert, non focal  E ENT: mild pallor, no icterus, oral mucosa moist Cardiovascular:  No JVD. S1-S2 present, rhythmic, no gallops, rubs, or murmurs. No lower extremity edema. Pulmonary: positive breath sounds bilaterally, adequate air movement, no wheezing, rhonchi or rales. Gastrointestinal. Abdomen with no organomegaly, non tender, no rebound or guarding Skin. No rashes Musculoskeletal: no joint deformities     Data Reviewed: I have personally reviewed following labs and imaging studies  CBC: Recent Labs  Lab  07/28/19 1930 07/30/19 0304  WBC 8.0 6.7  NEUTROABS 3.1  --   HGB 12.5 9.4*  HCT 41.7 31.0*  MCV 78.8* 76.5*  PLT 207 173   Basic Metabolic Panel: Recent Labs  Lab 07/28/19 1930 07/30/19 0304 08/01/19 0218  NA 133* 137 142  K 3.9 4.5 3.9  CL 96* 106 109  CO2 25 24 23   GLUCOSE 85 96 111*  BUN 6 9 13   CREATININE 0.72 0.51 0.60  CALCIUM 8.7* 8.1* 8.4*   GFR: Estimated Creatinine Clearance: 104.4 mL/min (by C-G formula based on SCr of 0.6 mg/dL). Liver Function Tests: Recent Labs  Lab 07/28/19 1930 07/30/19 0304  AST 21 14*  ALT 19 15  ALKPHOS 69 50  BILITOT 0.7 0.5  PROT 8.0 5.8*  ALBUMIN 3.6 2.4*   No results for input(s): LIPASE, AMYLASE in the last 168 hours. No results for input(s): AMMONIA in the last 168 hours. Coagulation Profile: No results for input(s): INR, PROTIME in the last 168 hours. Cardiac Enzymes: No results for input(s): CKTOTAL, CKMB, CKMBINDEX, TROPONINI in the last 168 hours. BNP (last 3 results) No results for input(s): PROBNP in the last 8760 hours. HbA1C: No results for input(s): HGBA1C in the last 72 hours. CBG: No results for input(s): GLUCAP in the last 168 hours. Lipid Profile: No results for input(s): CHOL, HDL, LDLCALC, TRIG, CHOLHDL, LDLDIRECT in the last 72 hours. Thyroid Function Tests: No results for input(s): TSH, T4TOTAL, FREET4, T3FREE, THYROIDAB in the last 72 hours. Anemia Panel: No results for input(s): VITAMINB12, FOLATE, FERRITIN, TIBC, IRON, RETICCTPCT in the last 72 hours.    Radiology Studies: I have reviewed all of the imaging during this hospital visit personally     Scheduled Meds: . acetaminophen  650 mg Oral Q6H  . cloNIDine  0.1 mg Oral BH-qamhs   Followed by  . [START ON 08/02/2019] cloNIDine  0.1 mg Oral QAC breakfast  . ibuprofen  400 mg Oral TID  . lidocaine  1 patch Transdermal Q24H  . pantoprazole  40 mg Oral Daily   Continuous Infusions: . vancomycin 1,250 mg (08/01/19 0338)     LOS: 3  days        Meloni Hinz 08/04/2019, MD

## 2019-08-02 LAB — BASIC METABOLIC PANEL
Anion gap: 9 (ref 5–15)
BUN: 10 mg/dL (ref 6–20)
CO2: 23 mmol/L (ref 22–32)
Calcium: 8.6 mg/dL — ABNORMAL LOW (ref 8.9–10.3)
Chloride: 108 mmol/L (ref 98–111)
Creatinine, Ser: 0.55 mg/dL (ref 0.44–1.00)
GFR calc Af Amer: >60 mL/min (ref >60)
GFR calc non Af Amer: >60 mL/min (ref >60)
Glucose, Bld: 93 mg/dL (ref 70–99)
Potassium: 3.6 mmol/L (ref 3.5–5.1)
Sodium: 140 mmol/L (ref 135–145)

## 2019-08-02 LAB — CULTURE, BLOOD (ROUTINE X 2)
Culture: NO GROWTH
Special Requests: ADEQUATE

## 2019-08-02 LAB — VANCOMYCIN, PEAK: Vancomycin Pk: 22 ug/mL — ABNORMAL LOW (ref 30–40)

## 2019-08-02 NOTE — Progress Notes (Signed)
Called to room for pt request to leave. Pt is very adamant that she does not want to stay here any longer. She states "I can not just sit here like this. I'll just drive to a clinic every week for my medicine." This RN explained to pt that her IV antibiotics were very important and needed to be administered daily for a few weeks. She verbalized an understanding. Explained that if she leaves, ID team will not be continuing her care as outpatient. Pt states she understands and is ready to leave. IV site removed, AMA paper provided. Pt read and understood the AMA form and signed the paper. Dr. Ella Jubilee paged and came to bedside to speak with pt. The pt was walking out of the room as Dr. Ella Jubilee entered. She agreed to speak with him before leaving. The pt walked off the unit shortly after speaking with MD.

## 2019-08-02 NOTE — Plan of Care (Signed)

## 2019-08-02 NOTE — Discharge Summary (Signed)
Physician Discharge Summary  Latoya [name] EQA:[id number] DOB: 10-28-1985 DOA: 07/28/2019  PCP: Patient, No Pcp Per  Admit date: 07/28/2019 Discharge date: 08/02/2019  Admitted From: Home  Disposition:  Home   Recommendations for Outpatient Follow-up and new medication changes:   Patient left the hospital against medical advice.  The patient demonstrates clear understanding of the risk of leaving the hospital against medical advice, alternatives options were discussed in detail including anxiolytic therapy and analgesics. Patient has decided to decline further inpatient traetment, and is aware of the possible outcomes including worsening condition, heart infection, brain infection and death. The reasoning behind her decision is that she can not stay  in the hospital for any longer. All questions were addressed and her mother was called, no answer and message left.    Brief/Interim Summary: Patient was admitted to the hospital with working diagnosis of L4/5 septic arthritis/ MRSA.  34 year old female presented with back pain. She does have significant past medical history for IV drug abuse, pneumonia and endocarditis (tricuspid valve).She injected heroin intravenously about 3 days prior to hospitalization. She reported worsening lower back pain for the last 48 hours,associated with fevers, chills and decreased mobility. On her initial physical examination her temperature was 101.6 F,blood pressure 107/68, heart rate 107, oxygen saturation 94%,her lungs were clear to auscultation bilaterally, heart S1-S2 present rhythmic, soft abdomen, no lower extremity edema. Sodium 133, potassium 3.9, chloride 96, bicarb 25, glucose 85, BUN 6, creatinine 0.72,white count 8.0, hemoglobin 12.5 hematocrit 41.7, platelets207.SARS COVID-19 negative. Urinalysis negative for infection. Drug screen positive for opiates and amphetamines. Lumbar spine MRI with reactive marrow edema and enhancement L4-L5  facets. No abscess.  Patient underwent fluoroscopy guided aspiration L4-L5, facet joints. Aspirate culture positive for MRSA, narrowed antibiotic therapy to vancomycin. Will need 6 weeks per ID recommendations  Today patient decided not to pursue any further therapy as an inpatient.  1.  L4-line 5 septic arthritis, MRSA, present on admission.  Patient received broad-spectrum antibiotic therapy, as per culture came back positive for MRSA and therapy was narrowed to intravenous vancomycin.  Patient received analgesics including ketorolac, ibuprofen, acetaminophen, and Suboxone.  Infectious disease was consulted, with recommendations to continue IV antibiotics for total of 6 weeks.  Patient symptoms improved, no fever, or leukocytosis.  Today she decided to leave the hospital.  2.  History of polysubstance abuse.  Patient received Suboxone while hospitalized with good toleration.  She will need outpatient follow-up.  3.  Hepatitis C.  Patient will need outpatient follow-up  Discharge Diagnoses:  Principal Problem:   Lumbar discitis Active Problems:   Anemia of infection and chronic disease   Opioid use disorder (HCC)   HCV antibody positive   Septic arthritis (HCC)   IV drug abuse [facility])    Discharge Instructions     Allergies  Allergen Reactions  . Naltrexone Anxiety    Severe anxiety, restlessness, vomiting    Consultations:  ID    Procedures/Studies: MR Lumbar Spine W Wo Contrast  Result Date: 07/28/2019 CLINICAL DATA:  Initial evaluation for acute fever and back pain. History of IV drug abuse. EXAM: MRI LUMBAR SPINE WITHOUT AND WITH CONTRAST TECHNIQUE: Multiplanar and multiecho pulse sequences of the lumbar spine were obtained without and with intravenous contrast. CONTRAST:  83mL GADAVIST GADOBUTROL 1 MMOL/ML IV SOLN COMPARISON:  Prior CT from 01/13/2019 FINDINGS: Segmentation: Standard. Lowest well-formed disc space labeled the L5-S1 level. Alignment: Levoscoliosis.  Alignment otherwise normal with preservation of the normal lumbar lordosis. No listhesis. Vertebrae: Vertebral  body height maintained without evidence for acute or chronic fracture. Bone marrow signal intensity diffusely decreased on T1 weighted imaging, nonspecific, but most commonly related to anemia, smoking, or obesity. Mild reactive marrow edema seen about the right greater than left L4-5 facets bilaterally. Associated trace bilateral joint effusions. While these findings could be degenerative in nature, possible septic arthritis could also be considered given the provided history. No other abnormal marrow edema or enhancement to suggest osteomyelitis or discitis. Subcentimeter benign hemangioma noted within the L3 vertebral body. No other discrete or worrisome osseous lesions. Conus medullaris and cauda equina: Conus extends to the L1 level. Conus and cauda equina appear normal. Mild epidural enhancement seen adjacent to the L4-5 facets without frank epidural abscess or collection (series 18, image 11). No other evidence for acute intracanalicular infection. Paraspinal and other soft tissues: Soft tissue edema and enhancement seen within the right greater than left posterior paraspinous soft tissues adjacent to the L4-5 facets. No discrete soft tissue abscess or drainable fluid collection seen on this motion degraded exam. Visualized soft tissues otherwise within normal limits. Partially visualized visceral structures unremarkable. Disc levels: L1-2:  Unremarkable. L2-3: Disc desiccation with mild annular disc bulge. Endplate Schmorl's node deformity. No stenosis or impingement. L3-4:  Minimal annular disc bulge.  No stenosis or impingement. L4-5: Negative interspace. Bilateral facet hypertrophy with associated trace bilateral joint effusions. Resultant mild spinal stenosis. Foramina remain patent. L5-S1: Negative interspace. Mild right greater than left facet hypertrophy. No stenosis. IMPRESSION: 1. Reactive  marrow edema and enhancement about the right greater than left L4-5 facets. While these findings may in part be degenerative in nature, appearance is concerning for possible acute septic arthritis given provided history. Associated mild epidural and paraspinous soft tissue enhancement without frank epidural or soft tissue abscess. 2. No other evidence for acute infection within the lumbar spine. Electronically Signed   By: Rise Mu M.D.   On: 07/28/2019 22:42   [place] EKG SITE RITE  Result Date: 07/31/2019 If Site Rite image not attached, placement could not be confirmed due to current cardiac rhythm.     Procedures: FLuoro aspiration R L4-5 and L L4-5 facet joints  Subjective: Patient is feeling better, her back pain has improved and patient is ambulating.   Discharge Exam: Vitals:   08/01/19 2233 08/02/19 0406  BP: 117/86 109/87  Pulse: 78 80  Resp: 16 14  Temp: 98.1 F (36.7 C) 97.8 F (36.6 C)  SpO2: 98% 98%   Vitals:   08/01/19 0817 08/01/19 1323 08/01/19 2233 08/02/19 0406  BP: 98/69 119/90 117/86 109/87  Pulse:  79 78 80  Resp:  Temp:  98 F (36.7 C) 98.1 F (36.7 C) 97.8 F (36.6 C)  TempSrc:  Oral Oral Oral  SpO2:  100% 98% 98%  Weight:      Height:        General: Not in pain or dyspnea.  Neurology: Awake and alert, non focal  E ENT: no pallor, no icterus, oral mucosa moist Cardiovascular: No JVD. S1-S2 present, rhythmic, no gallops, rubs, or murmurs. No lower extremity edema. Pulmonary: positive breath sounds bilaterally, adequate air movement, no wheezing, rhonchi or rales. Gastrointestinal. Abdomen with no organomegaly, non tender, no rebound or guarding Skin. No rashes Musculoskeletal: no joint deformities   The results of significant diagnostics from this hospitalization (including imaging, microbiology, ancillary and laboratory) are listed below for reference.     Microbiology: Recent Results (from the past 240 hour(s))  Blood  culture (routine x 2)     Status: None   Collection Time: 07/28/19  7:30 PM   Specimen: BLOOD RIGHT ARM  Result Value Ref Range Status   Specimen Description BLOOD RIGHT ARM  Final   Special Requests   Final    BOTTLES DRAWN AEROBIC AND ANAEROBIC Blood Culture adequate volume   Culture   Final    NO GROWTH 5 DAYS Performed at [facility], [address] [place], [place] [address]    Report Status 08/02/2019 FINAL  Final  Blood culture (routine x 2)     Status: Abnormal   Collection Time: 07/28/19  7:30 PM   Specimen: BLOOD LEFT ARM  Result Value Ref Range Status   Specimen Description BLOOD LEFT ARM  Final   Special Requests   Final    BOTTLES DRAWN AEROBIC AND ANAEROBIC Blood Culture adequate volume   Culture  Setup Time   Final    GRAM POSITIVE COCCI IN CHAINS ANAEROBIC BOTTLE ONLY CRITICAL RESULT CALLED TO, READ BACK BY AND VERIFIED WITH: PHARMD MINH P. [id number]    Culture (A)  Final    VIRIDANS STREPTOCOCCUS THE SIGNIFICANCE OF ISOLATING THIS ORGANISM FROM A SINGLE SET OF BLOOD CULTURES WHEN MULTIPLE SETS ARE DRAWN IS UNCERTAIN. PLEASE NOTIFY THE MICROBIOLOGY DEPARTMENT WITHIN ONE WEEK IF SPECIATION AND SENSITIVITIES ARE REQUIRED. Performed at [facility], [address], [place], [place] [address]    Report Status 07/31/2019 FINAL  Final  SARS CORONAVIRUS 2 (TAT 6-24 HRS) Nasopharyngeal Nasopharyngeal Swab     Status: None   Collection Time: 07/29/19 12:00 AM   Specimen: Nasopharyngeal Swab  Result Value Ref Range Status   SARS Coronavirus 2 NEGATIVE NEGATIVE Final    Comment: (NOTE) SARS-CoV-2 target nucleic acids are NOT DETECTED. The SARS-CoV-2 RNA is generally detectable in upper and lower respiratory specimens during the acute phase of infection. Negative results do not preclude SARS-CoV-2 infection, do not rule out co-infections with other pathogens, and should not be used as the sole basis for treatment or other patient management  decisions. Negative results must be combined with clinical observations, patient history, and epidemiological information. The expected result is Negative. Fact Sheet for Patients: HairSlick.no Fact Sheet for Healthcare Providers: quierodirigir.com This test is not yet approved or cleared by the [place] FDA and  has been authorized for detection and/or diagnosis of SARS-CoV-2 by FDA under an Emergency Use Authorization (EUA). This EUA will remain  in effect (meaning this test can be used) for the duration of the COVID-19 declaration under Section 56 4(b)(1) of the Act, 21 U.S.C. section 360bbb-3(b)(1), unless the authorization is terminated or revoked sooner. Performed at [facility], [address] [place], [place] [address]   Aerobic/Anaerobic Culture (surgical/deep wound)     Status: None (Preliminary result)   Collection Time: 07/29/19 12:54 PM   Specimen: Synovium; Synovial Fluid  Result Value Ref Range Status   Specimen Description SYNOVIAL FLUID  Final   Special Requests RIGHT L4 L5 FACET JOINT  Final   Gram Stain   Final    RARE WBC PRESENT,BOTH PMN AND MONONUCLEAR NO ORGANISMS SEEN    Culture   Final    RARE METHICILLIN RESISTANT STAPHYLOCOCCUS AUREUS NO ANAEROBES ISOLATED; CULTURE IN PROGRESS FOR 5 DAYS CULTURE REINCUBATED FOR BETTER GROWTH Performed at [facility], [address], [place], [place] [address]    Report Status PENDING  Incomplete   Organism ID,  Bacteria METHICILLIN RESISTANT STAPHYLOCOCCUS AUREUS  Final      Susceptibility   Methicillin resistant staphylococcus aureus - MIC*    CIPROFLOXACIN >=8 RESISTANT Resistant     ERYTHROMYCIN >=8 RESISTANT Resistant     GENTAMICIN <=0.5 SENSITIVE Sensitive     OXACILLIN >=4 RESISTANT Resistant     TETRACYCLINE <=1 SENSITIVE Sensitive     VANCOMYCIN 1 SENSITIVE Sensitive     TRIMETH/SULFA <=10 SENSITIVE Sensitive     CLINDAMYCIN >=8  RESISTANT Resistant     RIFAMPIN <=0.5 SENSITIVE Sensitive     Inducible Clindamycin NEGATIVE Sensitive     * RARE METHICILLIN RESISTANT STAPHYLOCOCCUS AUREUS  Body fluid culture     Status: None   Collection Time: 07/29/19 12:54 PM   Specimen: Synovium; Synovial Fluid  Result Value Ref Range Status   Specimen Description SYNOVIAL FLUID  Final   Special Requests LEFT L4 L5 FACET JOINT  Final   Gram Stain   Final    FEW WBC PRESENT, PREDOMINANTLY PMN NO ORGANISMS SEEN    Culture   Final    NO GROWTH 3 DAYS Performed at [facility], [address], [place], [place] [address]    Report Status 08/01/2019 FINAL  Final     Labs: BNP (last 3 results) No results for input(s): BNP in the last 8760 hours. Basic Metabolic Panel: Recent Labs  Lab 07/28/19 1930 07/30/19 0304 08/01/19 0218 08/02/19 0810  NA 133* 137 142 140  K 3.9 4.5 3.9 3.6  CL 96* 106 109 108  CO2 25 24 23 23   GLUCOSE 85 96 111* 93  BUN 6 9 13 10   CREATININE 0.72 0.51 0.60 0.55  CALCIUM 8.7* 8.1* 8.4* 8.6*   Liver Function Tests: Recent Labs  Lab 07/28/19 1930 07/30/19 0304  AST 21 14*  ALT 19 15  ALKPHOS 69 50  BILITOT 0.7 0.5  PROT 8.0 5.8*  ALBUMIN 3.6 2.4*   No results for input(s): LIPASE, AMYLASE in the last 168 hours. No results for input(s): AMMONIA in the last 168 hours. CBC: Recent Labs  Lab 07/28/19 1930 07/30/19 0304  WBC 8.0 6.7  NEUTROABS 3.1  --   HGB 12.5 9.4*  HCT 41.7 31.0*  MCV 78.8* 76.5*  PLT 207 173   Cardiac Enzymes: No results for input(s): CKTOTAL, CKMB, CKMBINDEX, TROPONINI in the last 168 hours. BNP: Invalid input(s): POCBNP CBG: No results for input(s): GLUCAP in the last 168 hours. D-Dimer No results for input(s): DDIMER in the last 72 hours. Hgb A1c No results for input(s): HGBA1C in the last 72 hours. Lipid Profile No results for input(s): CHOL, HDL, LDLCALC, TRIG, CHOLHDL, LDLDIRECT in the last 72 hours. Thyroid function studies No results for  input(s): TSH, T4TOTAL, T3FREE, THYROIDAB in the last 72 hours.  Invalid input(s): FREET3 Anemia work up No results for input(s): VITAMINB12, FOLATE, FERRITIN, TIBC, IRON, RETICCTPCT in the last 72 hours. Urinalysis    Component Value Date/Time   COLORURINE YELLOW 07/28/2019 1933   APPEARANCEUR CLEAR 07/28/2019 1933   LABSPEC 1.018 07/28/2019 1933   PHURINE 6.0 07/28/2019 1933   GLUCOSEU NEGATIVE 07/28/2019 1933   HGBUR NEGATIVE 07/28/2019 1933   BILIRUBINUR NEGATIVE 07/28/2019 1933   KETONESUR NEGATIVE 07/28/2019 1933   PROTEINUR NEGATIVE 07/28/2019 1933   NITRITE NEGATIVE 07/28/2019 1933   LEUKOCYTESUR SMALL (A) 07/28/2019 1933   Sepsis Labs Invalid input(s): PROCALCITONIN,  WBC,  LACTICIDVEN Microbiology Recent Results (from the past 240 hour(s))  Blood culture (routine x 2)  Status: None   Collection Time: 07/28/19  7:30 PM   Specimen: BLOOD RIGHT ARM  Result Value Ref Range Status   Specimen Description BLOOD RIGHT ARM  Final   Special Requests   Final    BOTTLES DRAWN AEROBIC AND ANAEROBIC Blood Culture adequate volume   Culture   Final    NO GROWTH 5 DAYS Performed at [facility], [address], [place], [place] [address]    Report Status 08/02/2019 FINAL  Final  Blood culture (routine x 2)     Status: Abnormal   Collection Time: 07/28/19  7:30 PM   Specimen: BLOOD LEFT ARM  Result Value Ref Range Status   Specimen Description BLOOD LEFT ARM  Final   Special Requests   Final    BOTTLES DRAWN AEROBIC AND ANAEROBIC Blood Culture adequate volume   Culture  Setup Time   Final    GRAM POSITIVE COCCI IN CHAINS ANAEROBIC BOTTLE ONLY CRITICAL RESULT CALLED TO, READ BACK BY AND VERIFIED WITH: PHARMD Portal P. [id number]    Culture (A)  Final    VIRIDANS STREPTOCOCCUS THE SIGNIFICANCE OF ISOLATING THIS ORGANISM FROM A SINGLE SET OF BLOOD CULTURES WHEN MULTIPLE SETS ARE DRAWN IS UNCERTAIN. PLEASE NOTIFY THE MICROBIOLOGY DEPARTMENT WITHIN ONE WEEK IF SPECIATION  AND SENSITIVITIES ARE REQUIRED. Performed at [facility], [place] [address], [place], [place] [address]    Report Status 07/31/2019 FINAL  Final  SARS CORONAVIRUS 2 (TAT 6-24 HRS) Nasopharyngeal Nasopharyngeal Swab     Status: None   Collection Time: 07/29/19 12:00 AM   Specimen: Nasopharyngeal Swab  Result Value Ref Range Status   SARS Coronavirus 2 NEGATIVE NEGATIVE Final    Comment: (NOTE) SARS-CoV-2 target nucleic acids are NOT DETECTED. The SARS-CoV-2 RNA is generally detectable in upper and lower respiratory specimens during the acute phase of infection. Negative results do not preclude SARS-CoV-2 infection, do not rule out co-infections with other pathogens, and should not be used as the sole basis for treatment or other patient management decisions. Negative results must be combined with clinical observations, patient history, and epidemiological information. The expected result is Negative. Fact Sheet for Patients: SugarRoll.be Fact Sheet for Healthcare Providers: https://www.woods-mathews.com/ This test is not yet approved or cleared by the [place] FDA and  has been authorized for detection and/or diagnosis of SARS-CoV-2 by FDA under an Emergency Use Authorization (EUA). This EUA will remain  in effect (meaning this test can be used) for the duration of the COVID-19 declaration under Section 56 4(b)(1) of the Act, 21 U.S.C. section 360bbb-3(b)(1), unless the authorization is terminated or revoked sooner. Performed at [facility], [place] [address], [place], [place] [address]   Aerobic/Anaerobic Culture (surgical/deep wound)     Status: None (Preliminary result)   Collection Time: 07/29/19 12:54 PM   Specimen: Synovium; Synovial Fluid  Result Value Ref Range Status   Specimen Description SYNOVIAL FLUID  Final   Special Requests RIGHT L4 L5 FACET JOINT  Final   Gram Stain   Final    RARE WBC PRESENT,BOTH PMN AND  MONONUCLEAR NO ORGANISMS SEEN    Culture   Final    RARE METHICILLIN RESISTANT STAPHYLOCOCCUS AUREUS NO ANAEROBES ISOLATED; CULTURE IN PROGRESS FOR 5 DAYS CULTURE REINCUBATED FOR BETTER GROWTH Performed at [facility], [address] [place], [place] [address]    Report Status PENDING  Incomplete   Organism ID, Bacteria METHICILLIN RESISTANT STAPHYLOCOCCUS AUREUS  Final  Susceptibility   Methicillin resistant staphylococcus aureus - MIC*    CIPROFLOXACIN >=8 RESISTANT Resistant     ERYTHROMYCIN >=8 RESISTANT Resistant     GENTAMICIN <=0.5 SENSITIVE Sensitive     OXACILLIN >=4 RESISTANT Resistant     TETRACYCLINE <=1 SENSITIVE Sensitive     VANCOMYCIN 1 SENSITIVE Sensitive     TRIMETH/SULFA <=10 SENSITIVE Sensitive     CLINDAMYCIN >=8 RESISTANT Resistant     RIFAMPIN <=0.5 SENSITIVE Sensitive     Inducible Clindamycin NEGATIVE Sensitive     * RARE METHICILLIN RESISTANT STAPHYLOCOCCUS AUREUS  Body fluid culture     Status: None   Collection Time: 07/29/19 12:54 PM   Specimen: Synovium; Synovial Fluid  Result Value Ref Range Status   Specimen Description SYNOVIAL FLUID  Final   Special Requests LEFT L4 L5 FACET JOINT  Final   Gram Stain   Final    FEW WBC PRESENT, PREDOMINANTLY PMN NO ORGANISMS SEEN    Culture   Final    NO GROWTH 3 DAYS Performed at [facility], [address] [place], [place] [id number]    Report Status 08/01/2019 FINAL  Final     Time coordinating discharge: 45 minutes  SIGNED:   Coralie KeensMauricio Daniel Briley Sulton, MD  [facility] 08/02/2019, 2:02 PM

## 2019-08-03 ENCOUNTER — Other Ambulatory Visit: Payer: Self-pay

## 2019-08-03 ENCOUNTER — Inpatient Hospital Stay (HOSPITAL_COMMUNITY)
Admission: EM | Admit: 2019-08-03 | Discharge: 2019-08-09 | DRG: 552 | Discharge status: Against Medical Advice | Payer: Medicaid Other | Attending: Internal Medicine | Admitting: Internal Medicine

## 2019-08-03 ENCOUNTER — Encounter (HOSPITAL_COMMUNITY): Payer: Self-pay | Admitting: *Deleted

## 2019-08-03 DIAGNOSIS — Z23 Encounter for immunization: Secondary | ICD-10-CM

## 2019-08-03 DIAGNOSIS — R768 Other specified abnormal immunological findings in serum: Secondary | ICD-10-CM | POA: Diagnosis not present

## 2019-08-03 DIAGNOSIS — Z22322 Carrier or suspected carrier of Methicillin resistant Staphylococcus aureus: Secondary | ICD-10-CM | POA: Diagnosis not present

## 2019-08-03 DIAGNOSIS — Z8679 Personal history of other diseases of the circulatory system: Secondary | ICD-10-CM | POA: Diagnosis not present

## 2019-08-03 DIAGNOSIS — Z20822 Contact with and (suspected) exposure to covid-19: Secondary | ICD-10-CM | POA: Diagnosis not present

## 2019-08-03 DIAGNOSIS — Z888 Allergy status to other drugs, medicaments and biological substances status: Secondary | ICD-10-CM | POA: Diagnosis not present

## 2019-08-03 DIAGNOSIS — D509 Iron deficiency anemia, unspecified: Secondary | ICD-10-CM | POA: Diagnosis not present

## 2019-08-03 DIAGNOSIS — M4626 Osteomyelitis of vertebra, lumbar region: Secondary | ICD-10-CM | POA: Diagnosis not present

## 2019-08-03 DIAGNOSIS — G8929 Other chronic pain: Secondary | ICD-10-CM | POA: Diagnosis present

## 2019-08-03 DIAGNOSIS — M545 Low back pain: Secondary | ICD-10-CM | POA: Diagnosis not present

## 2019-08-03 DIAGNOSIS — E876 Hypokalemia: Secondary | ICD-10-CM | POA: Diagnosis not present

## 2019-08-03 DIAGNOSIS — M0008 Staphylococcal arthritis, vertebrae: Secondary | ICD-10-CM

## 2019-08-03 DIAGNOSIS — F17210 Nicotine dependence, cigarettes, uncomplicated: Secondary | ICD-10-CM | POA: Diagnosis not present

## 2019-08-03 DIAGNOSIS — B9562 Methicillin resistant Staphylococcus aureus infection as the cause of diseases classified elsewhere: Secondary | ICD-10-CM | POA: Diagnosis not present

## 2019-08-03 DIAGNOSIS — F1910 Other psychoactive substance abuse, uncomplicated: Secondary | ICD-10-CM | POA: Diagnosis not present

## 2019-08-03 DIAGNOSIS — T829XXA Unspecified complication of cardiac and vascular prosthetic device, implant and graft, initial encounter: Secondary | ICD-10-CM

## 2019-08-03 DIAGNOSIS — B1920 Unspecified viral hepatitis C without hepatic coma: Secondary | ICD-10-CM | POA: Diagnosis present

## 2019-08-03 DIAGNOSIS — R0602 Shortness of breath: Secondary | ICD-10-CM | POA: Diagnosis not present

## 2019-08-03 DIAGNOSIS — F1120 Opioid dependence, uncomplicated: Secondary | ICD-10-CM | POA: Diagnosis present

## 2019-08-03 DIAGNOSIS — Z03818 Encounter for observation for suspected exposure to other biological agents ruled out: Secondary | ICD-10-CM | POA: Diagnosis not present

## 2019-08-03 DIAGNOSIS — M4656 Other infective spondylopathies, lumbar region: Principal | ICD-10-CM | POA: Diagnosis present

## 2019-08-03 HISTORY — DX: Iron deficiency anemia, unspecified: D50.9

## 2019-08-03 HISTORY — DX: Osteomyelitis of vertebra, lumbar region: M46.26

## 2019-08-03 HISTORY — DX: Carrier or suspected carrier of methicillin resistant Staphylococcus aureus: Z22.322

## 2019-08-03 LAB — COMPREHENSIVE METABOLIC PANEL
ALT: 16 U/L (ref 0–44)
AST: 20 U/L (ref 15–41)
Albumin: 3.5 g/dL (ref 3.5–5.0)
Alkaline Phosphatase: 65 U/L (ref 38–126)
Anion gap: 7 (ref 5–15)
BUN: 6 mg/dL (ref 6–20)
CO2: 29 mmol/L (ref 22–32)
Calcium: 9.1 mg/dL (ref 8.9–10.3)
Chloride: 103 mmol/L (ref 98–111)
Creatinine, Ser: 0.6 mg/dL (ref 0.44–1.00)
GFR calc Af Amer: >60 mL/min (ref >60)
GFR calc non Af Amer: >60 mL/min (ref >60)
Glucose, Bld: 99 mg/dL (ref 70–99)
Potassium: 3.3 mmol/L — ABNORMAL LOW (ref 3.5–5.1)
Sodium: 139 mmol/L (ref 135–145)
Total Bilirubin: 0.5 mg/dL (ref 0.3–1.2)
Total Protein: 7.7 g/dL (ref 6.5–8.1)

## 2019-08-03 LAB — CBC
HCT: 36.7 % (ref 36.0–46.0)
Hemoglobin: 11 g/dL — ABNORMAL LOW (ref 12.0–15.0)
MCH: 23.8 pg — ABNORMAL LOW (ref 26.0–34.0)
MCHC: 30 g/dL (ref 30.0–36.0)
MCV: 79.3 fL — ABNORMAL LOW (ref 80.0–100.0)
Platelets: 330 10*3/uL (ref 150–400)
RBC: 4.63 MIL/uL (ref 3.87–5.11)
RDW: 16.6 % — ABNORMAL HIGH (ref 11.5–15.5)
WBC: 7.3 10*3/uL (ref 4.0–10.5)
nRBC: 0 % (ref 0.0–0.2)

## 2019-08-03 LAB — IRON AND TIBC
Iron: 22 ug/dL — ABNORMAL LOW (ref 28–170)
Saturation Ratios: 7 % — ABNORMAL LOW (ref 10.4–31.8)
TIBC: 312 ug/dL (ref 250–450)
UIBC: 290 ug/dL

## 2019-08-03 LAB — AEROBIC/ANAEROBIC CULTURE W GRAM STAIN (SURGICAL/DEEP WOUND)

## 2019-08-03 LAB — FERRITIN: Ferritin: 51 ng/mL (ref 11–307)

## 2019-08-03 LAB — SARS CORONAVIRUS 2 (TAT 6-24 HRS): SARS Coronavirus 2: NEGATIVE

## 2019-08-03 LAB — LACTIC ACID, PLASMA: Lactic Acid, Venous: 1.1 mmol/L (ref 0.5–1.9)

## 2019-08-03 MED ORDER — IBUPROFEN 200 MG PO TABS
400.0000 mg | ORAL_TABLET | Freq: Four times a day (QID) | ORAL | Status: DC | PRN
  Administered 2019-08-07 – 2019-08-09 (×2): 400 mg via ORAL
  Filled 2019-08-03 (×2): qty 2

## 2019-08-03 MED ORDER — SODIUM CHLORIDE 0.9% FLUSH
3.0000 mL | Freq: Two times a day (BID) | INTRAVENOUS | Status: DC
  Administered 2019-08-03 – 2019-08-09 (×11): 3 mL via INTRAVENOUS

## 2019-08-03 MED ORDER — ACETAMINOPHEN 325 MG PO TABS
650.0000 mg | ORAL_TABLET | Freq: Four times a day (QID) | ORAL | Status: DC | PRN
  Administered 2019-08-04 – 2019-08-06 (×4): 650 mg via ORAL
  Filled 2019-08-03 (×4): qty 2

## 2019-08-03 MED ORDER — ONDANSETRON HCL 4 MG PO TABS: 4.0000 mg | ORAL_TABLET | Freq: Four times a day (QID) | ORAL | Status: DC | PRN

## 2019-08-03 MED ORDER — POTASSIUM CHLORIDE CRYS ER 20 MEQ PO TBCR
20.0000 meq | EXTENDED_RELEASE_TABLET | Freq: Once | ORAL | Status: AC
  Administered 2019-08-03: 20 meq via ORAL
  Filled 2019-08-03: qty 1

## 2019-08-03 MED ORDER — ALBUTEROL SULFATE (2.5 MG/3ML) 0.083% IN NEBU: 2.5000 mg | INHALATION_SOLUTION | Freq: Four times a day (QID) | RESPIRATORY_TRACT | Status: DC | PRN

## 2019-08-03 MED ORDER — ONDANSETRON HCL 4 MG/2ML IJ SOLN
4.0000 mg | Freq: Four times a day (QID) | INTRAMUSCULAR | Status: DC | PRN
  Administered 2019-08-06: 4 mg via INTRAVENOUS
  Filled 2019-08-03: qty 2

## 2019-08-03 MED ORDER — BUPRENORPHINE HCL-NALOXONE HCL 8-2 MG SL SUBL
1.0000 | SUBLINGUAL_TABLET | Freq: Two times a day (BID) | SUBLINGUAL | Status: DC
  Administered 2019-08-03 – 2019-08-05 (×5): 1 via SUBLINGUAL
  Filled 2019-08-03 (×7): qty 1

## 2019-08-03 MED ORDER — ACETAMINOPHEN 650 MG RE SUPP: 650.0000 mg | Freq: Four times a day (QID) | RECTAL | Status: DC | PRN

## 2019-08-03 MED ORDER — KETOROLAC TROMETHAMINE 30 MG/ML IJ SOLN
30.0000 mg | Freq: Four times a day (QID) | INTRAMUSCULAR | Status: AC | PRN
  Administered 2019-08-03 – 2019-08-07 (×10): 30 mg via INTRAVENOUS
  Filled 2019-08-03 (×11): qty 1

## 2019-08-03 MED ORDER — ENOXAPARIN SODIUM 40 MG/0.4ML ~~LOC~~ SOLN
40.0000 mg | SUBCUTANEOUS | Status: DC
  Administered 2019-08-03: 40 mg via SUBCUTANEOUS
  Filled 2019-08-03 (×4): qty 0.4

## 2019-08-03 MED ORDER — VANCOMYCIN HCL 1250 MG/250ML IV SOLN
1250.0000 mg | Freq: Two times a day (BID) | INTRAVENOUS | Status: DC
  Administered 2019-08-03 – 2019-08-09 (×13): 1250 mg via INTRAVENOUS
  Filled 2019-08-03 (×16): qty 250

## 2019-08-03 MED ORDER — KETOROLAC TROMETHAMINE 30 MG/ML IJ SOLN
30.0000 mg | Freq: Once | INTRAMUSCULAR | Status: AC
  Administered 2019-08-03: 30 mg via INTRAMUSCULAR
  Filled 2019-08-03: qty 1

## 2019-08-03 MED ORDER — SODIUM CHLORIDE 0.9% FLUSH: 3.0000 mL | Freq: Once | INTRAVENOUS | Status: DC

## 2019-08-03 NOTE — ED Notes (Signed)
Lunch Tray Ordered @ 1010. 

## 2019-08-03 NOTE — ED Provider Notes (Signed)
[facility] EMERGENCY DEPARTMENT Provider Note   CSN: [id number] Arrival date & time: 08/03/19  0429     History Chief Complaint  Patient presents with  . Back Pain    Latoya [name] is a 34 y.o. female with a past medical history significant for IV heroin abuse, hepatitis C, and history of endocarditis who he presents to the ED after leaving AGAINST MEDICAL ADVICE yesterday from the hospital due to worsening low back pain.  Patient was diagnosed with L4/L5 septic arthritis with positive MRSA blood cultures on 07/28/2019 and left AMA yesterday 08/02/19.  Patient notes after leaving the hospital her low back pain progressively got worse.  She rates her pain a 10/10, worse with movement and ambulation.  Denies fever and chills.  After leaving the hospital, patient admits to injecting heroin a few hours after discharge.  Saddle paresthesias, bowel/bladder incontinence, lower extremity weakness, and lower extremity numbness/tingling. Denies any low back injury after leaving the hospital.  No treatment prior to arrival.  Denies chest pain, shortness of breath, lower extremity edema, abdominal pain, and urinary symptoms.  History obtained from patient and past medical records. No interpreter used during encounter.      Past Medical History:  Diagnosis Date  . Heroin addiction (HCC)   . IV drug abuse [facility])     Patient Active Problem List   Diagnosis Date Noted  . Lumbar discitis 07/31/2019  . Sepsis (HCC) 07/29/2019  . Septic arthritis (HCC) 07/29/2019  . IV drug abuse (HCC) 07/29/2019  . Opioid use disorder (HCC) 04/05/2019  . HCV antibody positive 04/05/2019  . Cellulitis and abscess of right leg 04/04/2019  . Anemia of infection and chronic disease 01/10/2019    Past Surgical History:  Procedure Laterality Date  . IR LUMBAR DISC ASPIRATION W/IMG GUIDE  07/29/2019     OB History   No obstetric history on file.     Family History  Family history unknown: Yes      Social History   Tobacco Use  . Smoking status: Light Tobacco Smoker    Types: Cigarettes  . Smokeless tobacco: Never Used  Substance Use Topics  . Alcohol use: Yes  . Drug use: Yes    Types: Heroin    Comment: Heroin     Home Medications Prior to Admission medications   Medication Sig Start Date End Date Taking? Authorizing Provider  amoxicillin (AMOXIL) 500 MG capsule Take 1 capsule (500 mg total) by mouth 3 (three) times daily. Patient not taking: Reported on 06/03/2019 04/10/19   McDonald, Mia A, PA-C  lidocaine (XYLOCAINE) 2 % solution Use as directed 15 mLs in the mouth or throat every 3 (three) hours as needed for mouth pain. Patient not taking: Reported on 06/03/2019 04/10/19   McDonald, Pedro Earls A, PA-C  sulfamethoxazole-trimethoprim (BACTRIM DS) 800-160 MG tablet Take 1 tablet by mouth 2 (two) times daily. Patient not taking: Reported on 06/03/2019 04/07/19   Katherine Roan, MD    Allergies    Naltrexone  Review of Systems   Review of Systems  Constitutional: Negative for chills and fever.  Respiratory: Negative for shortness of breath.   Cardiovascular: Negative for chest pain.  Gastrointestinal: Negative for abdominal pain, diarrhea, nausea and vomiting.  Genitourinary: Negative for dysuria.  Musculoskeletal: Positive for back pain and gait problem. Negative for neck pain and neck stiffness.  Skin: Negative for rash.  All other systems reviewed and are negative.   Physical Exam Updated Vital Signs BP (!) 134/98 (  BP Location: Right Arm)   Pulse (!) 112   Temp 97.7 F (36.5 C) (Oral)   Resp 19   Ht 5\' 6"  (1.676 m)   Wt 74.8 kg   LMP 07/07/2019   SpO2 100%   BMI 26.63 kg/m   Physical Exam Vitals and nursing note reviewed.  Constitutional:      General: She is not in acute distress.    Appearance: She is not toxic-appearing.  HENT:     Head: Normocephalic.  Eyes:     Conjunctiva/sclera: Conjunctivae normal.  Neck:     Comments: No cervical midline  tenderness. Cardiovascular:     Rate and Rhythm: Normal rate and regular rhythm.     Pulses: Normal pulses.     Heart sounds: Normal heart sounds. No murmur. No friction rub. No gallop.   Pulmonary:     Effort: Pulmonary effort is normal.     Breath sounds: Normal breath sounds.  Abdominal:     General: Abdomen is flat. There is no distension.     Palpations: Abdomen is soft.     Tenderness: There is no abdominal tenderness. There is no guarding or rebound.     Comments: Abdomen soft, nondistended, nontender to palpation in all quadrants without guarding or peritoneal signs. No rebound.   Musculoskeletal:     Cervical back: Neck supple.     Comments: No thoracic or lumbar midline tenderness.  Right-sided lumbar paraspinal tenderness palpation.  Distal pulses and sensation intact.  Patient able to ambulate in the ED without difficulty.  Skin:    General: Skin is warm and dry.  Neurological:     General: No focal deficit present.     Mental Status: She is alert.  Psychiatric:        Mood and Affect: Mood normal.        Behavior: Behavior normal.     ED Results / Procedures / Treatments   Labs (all labs ordered are listed, but only abnormal results are displayed) Labs Reviewed  COMPREHENSIVE METABOLIC PANEL - Abnormal; Notable for the following components:      Result Value   Potassium 3.3 (*)    All other components within normal limits  CBC - Abnormal; Notable for the following components:   Hemoglobin 11.0 (*)    MCV 79.3 (*)    MCH 23.8 (*)    RDW 16.6 (*)    All other components within normal limits  CULTURE, BLOOD (ROUTINE X 2)  CULTURE, BLOOD (ROUTINE X 2)  LACTIC ACID, PLASMA  LACTIC ACID, PLASMA    EKG None  Radiology No results found.  Procedures Procedures (including critical care time)  Medications Ordered in ED Medications  sodium chloride flush (NS) 0.9 % injection 3 mL (has no administration in time range)  vancomycin (VANCOREADY) IVPB 1250  mg/250 mL (has no administration in time range)    ED Course  I have reviewed the triage vital signs and the nursing notes.  Pertinent labs & imaging results that were available during my care of the patient were reviewed by me and considered in my medical decision making (see chart for details).    MDM Rules/Calculators/A&P                     34 year old female presents to the ED after leaving [place] yesterday.  Patient was diagnosed with L4/L4 septic arthritis with positive MRSA blood cultures on 07/28/2019 and discharged from the hospital yesterday.  Patient  admits to worsening right-sided low back pain.  Denies saddle paresthesias, bowel/bladder incontinence, fever/chills, lower extremity numbness/tingling, and lower extremity weakness.  Stable vitals.  Patient is afebrile.  Patient no acute distress and nontoxic-appearing.   CBC reassuring with no leukocytosis.  CMP significant for hypokalemia at 3.3, but otherwise unremarkable.  Potassium repleted here in the ED.  Initial lactic acid normal at 1.1.  Blood cultures pending. IV vancomycin started here in the ED. Will consult hospitalist for admission.    Spoke to Dr. Katrinka Blazing with [facility] who agrees to admit patient for further treatment. COVID test pending.   Discussed case with Dr. Elesa Massed who agrees with assessment and plan.  Final Clinical Impression(s) / ED Diagnoses Final diagnoses:  None    Rx / DC Orders ED Discharge Orders    None       Mannie Stabile, PA-C 08/03/19 0719    Ward, Layla Maw, DO 08/03/19 2319

## 2019-08-03 NOTE — H&P (Addendum)
History and Physical    Latoya [name] MGQ:[id number] DOB: 05/27/1985 DOA: 08/03/2019  Referring MD/NP/PA: Claudette Stapler, PA-C PCP: Patient, No Pcp Per  Patient coming from: Home  Chief Complaint: Back pain  I have personally briefly reviewed patient's old medical records in [place] [facility]   HPI: Latoya [name] is a 34 y.o. female with medical history significant of IV heroin abuse and L4-5 septic arthritis with MRSA presents with complaints of back pain after leaving AMA from the hospital on 3/28.  She had just been hospitalized from 3/23-3/28, after presenting with sepsis secondary to L4-5 facet septic arthritis.  Patient underwent fluoroscopy guided aspiration for which cultures were positive for MRSA.  ID had been consulted and it was recommended that patient receive 6 weeks of IV vancomycin.  She reports that yesterday she had to leave as she could not see herself sitting here for 6 weeks.  When she left she admits to using IV heroin.  However, reports having severe lumbar back pain that was sharp and radiated down both of her legs and up her back.  Pain was worsened with any movement and she was not able to get comfortable.  Denies having any fever, nausea, vomiting, chest pain, diarrhea, abdominal pain, saddle anesthesia, urinary incontinence, or focal weakness.  Patient usually injects herself in the lower extremities.   ED Course: Upon admission into the emergency department patient was noted to be afebrile, pulse 76-1 12, blood pressure 124/95-137/96, and all other vital signs maintained.  Labs significant for WBC 7.3, hemoglobin 11, and potassium 3.3.  Blood cultures were again obtained.  Patient was given 20 mEq of potassium chloride, 30 mg of ketorolac IM, and started on vancomycin per pharmacy.  Review of Systems  Constitutional: Negative for chills and fever.  HENT: Negative for ear discharge and nosebleeds.   Eyes: Negative for double vision and photophobia.  Respiratory:  Negative for cough and sputum production.   Cardiovascular: Negative for chest pain and claudication.  Gastrointestinal: Negative for abdominal pain, diarrhea, nausea and vomiting.  Genitourinary: Negative for dysuria and frequency.  Musculoskeletal: Positive for back pain. Negative for falls.  Skin: Negative for itching and rash.  Neurological: Negative for loss of consciousness.  Psychiatric/Behavioral: Positive for substance abuse. Negative for memory loss.    Past Medical History:  Diagnosis Date  . Heroin addiction (HCC)   . IV drug abuse [facility])     Past Surgical History:  Procedure Laterality Date  . IR LUMBAR DISC ASPIRATION W/IMG GUIDE  07/29/2019     reports that she has been smoking cigarettes. She has never used smokeless tobacco. She reports current alcohol use. She reports current drug use. Drug: Heroin.  Allergies  Allergen Reactions  . Naltrexone Anxiety    Severe anxiety, restlessness, vomiting    Family History  Family history unknown: Yes    Prior to Admission medications   Not on File    Physical Exam:  Constitutional: Young middle-aged female who appears to be in discomfort Vitals:   08/03/19 0450 08/03/19 0624 08/03/19 0630 08/03/19 0645  BP:  (!) 134/98 (!) 124/95 (!) 128/91  Pulse:  (!) 112    Resp:  19    Temp:      TempSrc:      SpO2:  100%    Weight: 74.8 kg 74.8 kg    Height:  5\' 6"  (1.676 m)     Eyes: PERRL, lids and conjunctivae normal ENMT: Mucous membranes are moist. Posterior pharynx clear of  any exudate or lesions. Normal dentition.  Neck: normal, supple, no masses, no thyromegaly Respiratory: clear to auscultation bilaterally, no wheezing, no crackles. Normal respiratory effort. No accessory muscle use.  Cardiovascular: Regular rate and rhythm, no murmurs / rubs / gallops. No extremity edema. 2+ pedal pulses. No carotid bruits.  Abdomen: no tenderness, no masses palpated. No hepatosplenomegaly. Bowel sounds positive.   Musculoskeletal: no clubbing / cyanosis. No joint deformity upper and lower extremities. Good ROM, no contractures. Normal muscle tone.  Skin:  scabs present of the lower extremities. Neurologic: CN 2-12 grossly intact. Sensation intact, DTR normal. Strength 5/5 in all 4.  Psychiatric: Alert and oriented x 3. Normal mood.     Labs on Admission: I have personally reviewed following labs and imaging studies  CBC: Recent Labs  Lab 07/28/19 1930 07/30/19 0304 08/03/19 0458  WBC 8.0 6.7 7.3  NEUTROABS 3.1  --   --   HGB 12.5 9.4* 11.0*  HCT 41.7 31.0* 36.7  MCV 78.8* 76.5* 79.3*  PLT 207 173 330   Basic Metabolic Panel: Recent Labs  Lab 07/28/19 1930 07/30/19 0304 08/01/19 0218 08/02/19 0810 08/03/19 0458  NA 133* 137 142 140 139  K 3.9 4.5 3.9 3.6 3.3*  CL 96* 106 109 108 103  CO2 25 24 23 23 29   GLUCOSE 85 96 111* 93 99  BUN 6 9 13 10 6   CREATININE 0.72 0.51 0.60 0.55 0.60  CALCIUM 8.7* 8.1* 8.4* 8.6* 9.1   GFR: Estimated Creatinine Clearance: 104.4 mL/min (by C-G formula based on SCr of 0.6 mg/dL). Liver Function Tests: Recent Labs  Lab 07/28/19 1930 07/30/19 0304 08/03/19 0458  AST 21 14* 20  ALT 19 15 16   ALKPHOS 69 50 65  BILITOT 0.7 0.5 0.5  PROT 8.0 5.8* 7.7  ALBUMIN 3.6 2.4* 3.5   No results for input(s): LIPASE, AMYLASE in the last 168 hours. No results for input(s): AMMONIA in the last 168 hours. Coagulation Profile: No results for input(s): INR, PROTIME in the last 168 hours. Cardiac Enzymes: No results for input(s): CKTOTAL, CKMB, CKMBINDEX, TROPONINI in the last 168 hours. BNP (last 3 results) No results for input(s): PROBNP in the last 8760 hours. HbA1C: No results for input(s): HGBA1C in the last 72 hours. CBG: No results for input(s): GLUCAP in the last 168 hours. Lipid Profile: No results for input(s): CHOL, HDL, LDLCALC, TRIG, CHOLHDL, LDLDIRECT in the last 72 hours. Thyroid Function Tests: No results for input(s): TSH, T4TOTAL,  FREET4, T3FREE, THYROIDAB in the last 72 hours. Anemia Panel: No results for input(s): VITAMINB12, FOLATE, FERRITIN, TIBC, IRON, RETICCTPCT in the last 72 hours. Urine analysis:    Component Value Date/Time   COLORURINE YELLOW 07/28/2019 1933   APPEARANCEUR CLEAR 07/28/2019 1933   LABSPEC 1.018 07/28/2019 1933   PHURINE 6.0 07/28/2019 1933   GLUCOSEU NEGATIVE 07/28/2019 1933   HGBUR NEGATIVE 07/28/2019 1933   BILIRUBINUR NEGATIVE 07/28/2019 1933   KETONESUR NEGATIVE 07/28/2019 1933   PROTEINUR NEGATIVE 07/28/2019 1933   NITRITE NEGATIVE 07/28/2019 1933   LEUKOCYTESUR SMALL (A) 07/28/2019 1933   Sepsis Labs: Recent Results (from the past 240 hour(s))  Blood culture (routine x 2)     Status: None   Collection Time: 07/28/19  7:30 PM   Specimen: BLOOD RIGHT ARM  Result Value Ref Range Status   Specimen Description BLOOD RIGHT ARM  Final   Special Requests   Final    BOTTLES DRAWN AEROBIC AND ANAEROBIC Blood Culture adequate volume  Culture   Final    NO GROWTH 5 DAYS Performed at [facility], [address], [place], [place] [address]    Report Status 08/02/2019 FINAL  Final  Blood culture (routine x 2)     Status: Abnormal   Collection Time: 07/28/19  7:30 PM   Specimen: BLOOD LEFT ARM  Result Value Ref Range Status   Specimen Description BLOOD LEFT ARM  Final   Special Requests   Final    BOTTLES DRAWN AEROBIC AND ANAEROBIC Blood Culture adequate volume   Culture  Setup Time   Final    GRAM POSITIVE COCCI IN CHAINS ANAEROBIC BOTTLE ONLY CRITICAL RESULT CALLED TO, READ BACK BY AND VERIFIED WITH: PHARMD MINH P. [id number]    Culture (A)  Final    VIRIDANS STREPTOCOCCUS THE SIGNIFICANCE OF ISOLATING THIS ORGANISM FROM A SINGLE SET OF BLOOD CULTURES WHEN MULTIPLE SETS ARE DRAWN IS UNCERTAIN. PLEASE NOTIFY THE MICROBIOLOGY DEPARTMENT WITHIN ONE WEEK IF SPECIATION AND SENSITIVITIES ARE REQUIRED. Performed at [facility], [address] [place], [place]  [address]    Report Status 07/31/2019 FINAL  Final  SARS CORONAVIRUS 2 (TAT 6-24 HRS) Nasopharyngeal Nasopharyngeal Swab     Status: None   Collection Time: 07/29/19 12:00 AM   Specimen: Nasopharyngeal Swab  Result Value Ref Range Status   SARS Coronavirus 2 NEGATIVE NEGATIVE Final    Comment: (NOTE) SARS-CoV-2 target nucleic acids are NOT DETECTED. The SARS-CoV-2 RNA is generally detectable in upper and lower respiratory specimens during the acute phase of infection. Negative results do not preclude SARS-CoV-2 infection, do not rule out co-infections with other pathogens, and should not be used as the sole basis for treatment or other patient management decisions. Negative results must be combined with clinical observations, patient history, and epidemiological information. The expected result is Negative. Fact Sheet for Patients: HairSlick.no Fact Sheet for Healthcare Providers: quierodirigir.com This test is not yet approved or cleared by the [place] FDA and  has been authorized for detection and/or diagnosis of SARS-CoV-2 by FDA under an Emergency Use Authorization (EUA). This EUA will remain  in effect (meaning this test can be used) for the duration of the COVID-19 declaration under Section 56 4(b)(1) of the Act, 21 U.S.C. section 360bbb-3(b)(1), unless the authorization is terminated or revoked sooner. Performed at [facility], [address], [place], [place] [address]   Aerobic/Anaerobic Culture (surgical/deep wound)     Status: None (Preliminary result)   Collection Time: 07/29/19 12:54 PM   Specimen: Synovium; Synovial Fluid  Result Value Ref Range Status   Specimen Description SYNOVIAL FLUID  Final   Special Requests RIGHT L4 L5 FACET JOINT  Final   Gram Stain   Final    RARE WBC PRESENT,BOTH PMN AND MONONUCLEAR NO ORGANISMS SEEN    Culture   Final    RARE METHICILLIN RESISTANT STAPHYLOCOCCUS AUREUS  NO ANAEROBES ISOLATED; CULTURE IN PROGRESS FOR 5 DAYS CULTURE REINCUBATED FOR BETTER GROWTH Performed at [facility], [address] [place], [place] [address]    Report Status PENDING  Incomplete   Organism ID, Bacteria METHICILLIN RESISTANT STAPHYLOCOCCUS AUREUS  Final      Susceptibility   Methicillin resistant staphylococcus aureus - MIC*    CIPROFLOXACIN >=8 RESISTANT Resistant     ERYTHROMYCIN >=8 RESISTANT Resistant     GENTAMICIN <=0.5 SENSITIVE Sensitive     OXACILLIN >=4 RESISTANT Resistant     TETRACYCLINE <=1 SENSITIVE Sensitive  VANCOMYCIN 1 SENSITIVE Sensitive     TRIMETH/SULFA <=10 SENSITIVE Sensitive     CLINDAMYCIN >=8 RESISTANT Resistant     RIFAMPIN <=0.5 SENSITIVE Sensitive     Inducible Clindamycin NEGATIVE Sensitive     * RARE METHICILLIN RESISTANT STAPHYLOCOCCUS AUREUS  Body fluid culture     Status: None   Collection Time: 07/29/19 12:54 PM   Specimen: Synovium; Synovial Fluid  Result Value Ref Range Status   Specimen Description SYNOVIAL FLUID  Final   Special Requests LEFT L4 L5 FACET JOINT  Final   Gram Stain   Final    FEW WBC PRESENT, PREDOMINANTLY PMN NO ORGANISMS SEEN    Culture   Final    NO GROWTH 3 DAYS Performed at [facility] Lab, [address], [place], [place] [address]    Report Status 08/01/2019 FINAL  Final     Radiological Exams on Admission: No results found.    Assessment/Plan L4-L5 septic arthritis, MRSA: Patient presents back to the hospital after leaving [place] on 3/28 after being found to have septic arthritis of L4 and L5 on MRI.  Patient underwent fluoroscopy guided aspiration L4-5 by interventional radiology.  Aspirate cultures positive for MRSA.  ID recommended 6 weeks of antibiotics which would be completed on 09/08/2019. -Admit to a medical telemetry bed -Follow-up repeat blood cultures -Vancomycin per pharmacy -Ketorolac, ibuprofen, and acetaminophen  as needed for pain -Appreciate ID consultative  services -Consider placement of PICC line if patient stays in hospital couple days  Microcytic hypochromic anemia: Chronic.  Hemoglobin 11 g/dL with low MCV and MCH, but appears improved from 3/25 -Check iron studies in a.m.  IV drug abuse polysubstance abuse: Patient left the hospital AMA and reported using heroin last on 3/28.  She also admits to smoking a couple cigarettes per day on average. -Restart Suboxone 8-2 mg twice daily -Continue counseling on the need of cessation of heroin use  Hypokalemia: Acute.  Potassium 3.3 on admission.  Patient was given 20 mEq of potassium chloride p.o. -Continue to monitor and replace as needed  Hepatitis C -Will need outpatient follow-up  DVT prophylaxis: Lovenox Code Status: Full Family Communication: No family requested to be updated at this time Disposition Plan: To be determined Consults called: None Admission status: Inpatient  Norval Morton MD [facility] Pager [phone]   If 7PM-7AM, please contact night-coverage [email or web address] Password [facility]  08/03/2019, 7:39 AM

## 2019-08-03 NOTE — Progress Notes (Signed)
Patient ID: Latoya [name], female   DOB: [birth year], 34 y.o.   MRN: [id number]          [facility] for Infectious Disease    Date of Admission:  08/03/2019   Total days of antibiotics 6         Ms. Delair has recently diagnosed L4-5 facet joint MRSA infection.  She has a history of addiction, polysubstance use and injecting drug use.  She left the hospital AGAINST MEDICAL ADVICE yesterday and used IV heroin 1 but returned overnight because of severe low back pain radiating into her legs.  I agree with restarting IV vancomycin but would hold off on PICC placement until we have further assurances that she will not leave again.  If she starts to talk about leaving again we will need to consider all other options including oral antibiotic therapy +/- long-acting oritavancin or dalbavancin.         Cliffton Asters, MD [facility] for Infectious Disease [facility] [phone] pager   [phone] cell 08/03/2019, 3:58 PM

## 2019-08-03 NOTE — ED Notes (Signed)
Pt's dinner tray arrived. Pt still sleeping. Will continue to monitor.

## 2019-08-03 NOTE — ED Notes (Signed)
Meal tray ordered 

## 2019-08-03 NOTE — ED Triage Notes (Signed)
The pt reports that she has a spine infection and she signed herself out of this hospital yesterday.  She left against medical advise after being admitted here for one week  shes here because the pain has gotten worse

## 2019-08-03 NOTE — Progress Notes (Signed)
Pharmacy Antibiotic Note  Latoya [name] is a 34 y.o. female admitted on 08/03/2019 with MRSA lumbar infection.  Pharmacy has been consulted for Vancomycin dosing. Pt just left AMA yesterday. Renal function remains stable.   Plan: Re-start Vancomycin 1250 mg IV q12h >>Estimated AUC: 424 Trend WBC, temp, renal function  F/U infectious work-up Drug levels as indicated   Weight: 164 lb 14.5 oz (74.8 kg)  Temp (24hrs), Avg:97.7 F (36.5 C), Min:97.7 F (36.5 C), Max:97.7 F (36.5 C)  Recent Labs  Lab 07/28/19 1930 07/28/19 1935 07/30/19 0304 08/01/19 0218 08/02/19 0810 08/02/19 0814 08/03/19 0458  WBC 8.0  --  6.7  --   --   --   --   CREATININE 0.72  --  0.51 0.60 0.55  --  0.60  LATICACIDVEN  --  1.5  --   --   --   --  1.1  VANCOPEAK  --   --   --   --   --  22*  --     Estimated Creatinine Clearance: 104.4 mL/min (by C-G formula based on SCr of 0.6 mg/dL).    Allergies  Allergen Reactions  . Naltrexone Anxiety    Severe anxiety, restlessness, vomiting    Abran Duke, PharmD, BCPS Clinical Pharmacist Phone: [phone]

## 2019-08-04 ENCOUNTER — Other Ambulatory Visit: Payer: Self-pay

## 2019-08-04 LAB — BASIC METABOLIC PANEL
Anion gap: 9 (ref 5–15)
BUN: 9 mg/dL (ref 6–20)
CO2: 27 mmol/L (ref 22–32)
Calcium: 8.8 mg/dL — ABNORMAL LOW (ref 8.9–10.3)
Chloride: 104 mmol/L (ref 98–111)
Creatinine, Ser: 0.56 mg/dL (ref 0.44–1.00)
GFR calc Af Amer: >60 mL/min (ref >60)
GFR calc non Af Amer: >60 mL/min (ref >60)
Glucose, Bld: 93 mg/dL (ref 70–99)
Potassium: 4.2 mmol/L (ref 3.5–5.1)
Sodium: 140 mmol/L (ref 135–145)

## 2019-08-04 MED ORDER — ALPRAZOLAM 0.25 MG PO TABS
0.2500 mg | ORAL_TABLET | Freq: Two times a day (BID) | ORAL | Status: DC | PRN
  Administered 2019-08-04 – 2019-08-09 (×9): 0.25 mg via ORAL
  Filled 2019-08-04 (×9): qty 1

## 2019-08-04 MED ORDER — INFLUENZA VAC SPLIT QUAD 0.5 ML IM SUSY
0.5000 mL | PREFILLED_SYRINGE | INTRAMUSCULAR | Status: AC
  Administered 2019-08-05: 0.5 mL via INTRAMUSCULAR
  Filled 2019-08-04: qty 0.5

## 2019-08-04 NOTE — ED Notes (Signed)
Lunch tray provided to patient at this time.

## 2019-08-04 NOTE — ED Notes (Signed)
Pt being transported to 2W04 via bed w/telemetry. Pt advised she has all of her belongings.

## 2019-08-04 NOTE — ED Notes (Signed)
Lunch tray ordered at this time.

## 2019-08-04 NOTE — ED Notes (Signed)
Pt not on cardiac monitor during initial assessment. Placed on tele monitor. Currently A&O x4, VSS.

## 2019-08-04 NOTE — ED Notes (Signed)
Pt aware of bed assignment - is notifying her friend via cell phone - 2W04.

## 2019-08-04 NOTE — Progress Notes (Signed)
PROGRESS NOTE    Latoya [name]  ZOX:[id number] DOB: 1986-01-31 DOA: 08/03/2019 PCP: Patient, No Pcp Per   Brief Narrative: As per admitting physician: 34 y.o. female with medical history significant of IV heroin abuse and L4-5 septic arthritis with MRSA presents with complaints of back pain after leaving AMA from the hospital on 3/28. She was admitted on 3/23  For sepsis secondary to L4-5 facet septic arthritis-s/p fluoroscopy guided aspiration  And cultures grew MRSA, sen by ID and was planned for 6 weeks of IV vancomycin. She reports she had to leave AMA yesterday and apparently admitted using heroin but was having severe lumbar back pain that was sharp and radiating so came to the ER.   Denies having any fever, nausea, vomiting, chest pain, diarrhea, abdominal pain, saddle anesthesia, urinary incontinence, or focal weakness.  Patient usually injects herself in the lower extremities. ED Course: Upon admission into the emergency department patient was noted to be afebrile, pulse 76-1 12, blood pressure 124/95-137/96, and all other vital signs maintained.  Labs significant for WBC 7.3, hemoglobin 11, and potassium 3.3.  Blood cultures were again obtained.  Patient was given 20 mEq of potassium chloride, 30 mg of ketorolac IM, and started on vancomycin per pharmacy.  Subjective: Seen this morning.  Resting comfortably.  Denies any abdominal pain chest pain back pain.   Afebrile overnight saturating well on room air.  Vitals are stable. Assessment & Plan:  Recently diagnosed L4-5 facet joint MRSA infection in the setting of IV drug abuse: Patient left AMA and readmitted, seen by ID, continue IV vancomycin with IV line but hold off on PICC placement until further assurance that she will not leave again.  ID and pharmacy will follow and dose vancomycin.  Follow repeat blood cultures.  IV heroin abuse/substance abuse: Continue daily relaxers, ibuprofen and Tylenol as needed for pain, patient placed  back on Suboxone, continue counseling on cessation of February.  Counseled on not leaving his medical advice explained risk of worsening infection that could lead to sepsis, paralysis and even death.  Microcytic hypochromic anemia chronic hemoglobin is stable, iron studies ferritin stable 51 likely acute phase reactant, iron 22 saturation 7, add iron sulfate.  Hypokalemia: replaced  Hepatitis C hx-lfts stable  Nutrition: Diet Order            Diet regular Room service appropriate? Yes; Fluid consistency: Thin  Diet effective now              DVT prophylaxis:lovenox Code Status:FULL  Family Communication: plan of care discussed with patient at bedside. Disposition Plan: Patient is from:Home Anticipated Disposition:TBD Barriers to discharge or conditions that needs to be met prior to discharge: Patient readmitted with facet joint MRSA infection, remains on IV antibiotics disposition difficult due to need for prolonged IV antibiotics, tendency to leave [place], social worker to follow once disposition decided.  Consultants: Infectious disease. Procedures:see note Microbiology:see note   Medications: Scheduled Meds: . buprenorphine-naloxone  1 tablet Sublingual BID  . enoxaparin (LOVENOX) injection  40 mg Subcutaneous Q24H  . sodium chloride flush  3 mL Intravenous Once  . sodium chloride flush  3 mL Intravenous Q12H   Continuous Infusions: . vancomycin Stopped (08/03/19 2259)    Antimicrobials: Anti-infectives (From admission, onward)   Start     Dose/Rate Route Frequency Ordered Stop   08/03/19 0630  vancomycin (VANCOREADY) IVPB 1250 mg/250 mL     1,250 mg 166.7 mL/hr over 90 Minutes Intravenous Every 12 hours 08/03/19  6387         Objective: Vitals: Today's Vitals   08/04/19 0445 08/04/19 0730 08/04/19 0801 08/04/19 0830  BP:      Pulse: 82 78  85  Resp: '18 19  15  ' Temp:      TempSrc:      SpO2: 100% 97%  99%  Weight:      Height:        PainSc:   3      Intake/Output Summary (Last 24 hours) at 08/04/2019 0859 Last data filed at 08/03/2019 2259 Gross per 24 hour  Intake 500.05 ml  Output --  Net 500.05 ml   Filed Weights   08/03/19 0450 08/03/19 0624  Weight: 74.8 kg 74.8 kg   Weight change:    Intake/Output from previous day: 03/29 0701 - 03/30 0700 In: 500.1 [IV Piggyback:500.1] Out: -  Intake/Output this shift: No intake/output data recorded.  Examination:  General exam: AAO ,NAD, weak appearing. HEENT:Oral mucosa moist, Ear/Nose WNL grossly,dentition normal. Respiratory system: bilaterally clear,no wheezing or crackles,no use of accessory muscle, non tender. Cardiovascular system: S1 & S2 +, regular, No JVD. Gastrointestinal system: Abdomen soft, NT,ND, BS+. Nervous System:Alert, awake, moving extremities and grossly nonfocal Extremities: No edema, distal peripheral pulses palpable.  Skin: No rashes,no icterus. MSK: Normal muscle bulk,tone, power  Data Reviewed: I have personally reviewed following labs and imaging studies CBC: Recent Labs  Lab 07/28/19 1930 07/30/19 0304 08/03/19 0458  WBC 8.0 6.7 7.3  NEUTROABS 3.1  --   --   HGB 12.5 9.4* 11.0*  HCT 41.7 31.0* 36.7  MCV 78.8* 76.5* 79.3*  PLT 207 173 564   Basic Metabolic Panel: Recent Labs  Lab 07/28/19 1930 07/30/19 0304 08/01/19 0218 08/02/19 0810 08/03/19 0458  NA 133* 137 142 140 139  K 3.9 4.5 3.9 3.6 3.3*  CL 96* 106 109 108 103  CO2 '25 24 23 23 29  ' GLUCOSE 85 96 111* 93 99  BUN '6 9 13 10 6  ' CREATININE 0.72 0.51 0.60 0.55 0.60  CALCIUM 8.7* 8.1* 8.4* 8.6* 9.1   GFR: Estimated Creatinine Clearance: 104.4 mL/min (by C-G formula based on SCr of 0.6 mg/dL). Liver Function Tests: Recent Labs  Lab 07/28/19 1930 07/30/19 0304 08/03/19 0458  AST 21 14* 20  ALT '19 15 16  ' ALKPHOS 69 50 65  BILITOT 0.7 0.5 0.5  PROT 8.0 5.8* 7.7  ALBUMIN 3.6 2.4* 3.5   No results for input(s): LIPASE, AMYLASE in the last 168  hours. No results for input(s): AMMONIA in the last 168 hours. Coagulation Profile: No results for input(s): INR, PROTIME in the last 168 hours. Cardiac Enzymes: No results for input(s): CKTOTAL, CKMB, CKMBINDEX, TROPONINI in the last 168 hours. BNP (last 3 results) No results for input(s): PROBNP in the last 8760 hours. HbA1C: No results for input(s): HGBA1C in the last 72 hours. CBG: No results for input(s): GLUCAP in the last 168 hours. Lipid Profile: No results for input(s): CHOL, HDL, LDLCALC, TRIG, CHOLHDL, LDLDIRECT in the last 72 hours. Thyroid Function Tests: No results for input(s): TSH, T4TOTAL, FREET4, T3FREE, THYROIDAB in the last 72 hours. Anemia Panel: Recent Labs    08/03/19 0925  FERRITIN 51  TIBC 312  IRON 22*   Sepsis Labs: Recent Labs  Lab 07/28/19 1935 08/03/19 0458  LATICACIDVEN 1.5 1.1    Recent Results (from the past 240 hour(s))  Blood culture (routine x 2)     Status: None   Collection  Time: 07/28/19  7:30 PM   Specimen: BLOOD RIGHT ARM  Result Value Ref Range Status   Specimen Description BLOOD RIGHT ARM  Final   Special Requests   Final    BOTTLES DRAWN AEROBIC AND ANAEROBIC Blood Culture adequate volume   Culture   Final    NO GROWTH 5 DAYS Performed at [facility], [address] [place], [place] [address]    Report Status 08/02/2019 FINAL  Final  Blood culture (routine x 2)     Status: Abnormal   Collection Time: 07/28/19  7:30 PM   Specimen: BLOOD LEFT ARM  Result Value Ref Range Status   Specimen Description BLOOD LEFT ARM  Final   Special Requests   Final    BOTTLES DRAWN AEROBIC AND ANAEROBIC Blood Culture adequate volume   Culture  Setup Time   Final    GRAM POSITIVE COCCI IN CHAINS ANAEROBIC BOTTLE ONLY CRITICAL RESULT CALLED TO, READ BACK BY AND VERIFIED WITH: PHARMD [place] P. [id number]    Culture (A)  Final    VIRIDANS STREPTOCOCCUS THE SIGNIFICANCE OF ISOLATING THIS ORGANISM FROM A SINGLE SET OF BLOOD CULTURES  WHEN MULTIPLE SETS ARE DRAWN IS UNCERTAIN. PLEASE NOTIFY THE MICROBIOLOGY DEPARTMENT WITHIN ONE WEEK IF SPECIATION AND SENSITIVITIES ARE REQUIRED. Performed at [facility], [place] [address] [place], [place] [address]    Report Status 07/31/2019 FINAL  Final  SARS CORONAVIRUS 2 (TAT 6-24 HRS) Nasopharyngeal Nasopharyngeal Swab     Status: None   Collection Time: 07/29/19 12:00 AM   Specimen: Nasopharyngeal Swab  Result Value Ref Range Status   SARS Coronavirus 2 NEGATIVE NEGATIVE Final    Comment: (NOTE) SARS-CoV-2 target nucleic acids are NOT DETECTED. The SARS-CoV-2 RNA is generally detectable in upper and lower respiratory specimens during the acute phase of infection. Negative results do not preclude SARS-CoV-2 infection, do not rule out co-infections with other pathogens, and should not be used as the sole basis for treatment or other patient management decisions. Negative results must be combined with clinical observations, patient history, and epidemiological information. The expected result is Negative. Fact Sheet for Patients: SugarRoll.be Fact Sheet for Healthcare Providers: https://www.woods-mathews.com/ This test is not yet approved or cleared by the [place] FDA and  has been authorized for detection and/or diagnosis of SARS-CoV-2 by FDA under an Emergency Use Authorization (EUA). This EUA will remain  in effect (meaning this test can be used) for the duration of the COVID-19 declaration under Section 56 4(b)(1) of the Act, 21 U.S.C. section 360bbb-3(b)(1), unless the authorization is terminated or revoked sooner. Performed at [facility], [place] [address], [place], [place] [address]   Aerobic/Anaerobic Culture (surgical/deep wound)     Status: None   Collection Time: 07/29/19 12:54 PM   Specimen: Synovium; Synovial Fluid  Result Value Ref Range Status   Specimen Description SYNOVIAL FLUID  Final   Special  Requests RIGHT L4 L5 FACET JOINT  Final   Gram Stain   Final    RARE WBC PRESENT,BOTH PMN AND MONONUCLEAR NO ORGANISMS SEEN    Culture   Final    RARE METHICILLIN RESISTANT STAPHYLOCOCCUS AUREUS NO ANAEROBES ISOLATED CRITICAL RESULT CALLED TO, READ BACK BY AND VERIFIED WITH: RN Julianne Rice [phone] FCP Performed at [facility], [place] [address] [place], [place] [address]    Report Status 08/03/2019 FINAL  Final   Organism ID, Bacteria METHICILLIN RESISTANT STAPHYLOCOCCUS AUREUS  Final  Susceptibility   Methicillin resistant staphylococcus aureus - MIC*    CIPROFLOXACIN >=8 RESISTANT Resistant     ERYTHROMYCIN >=8 RESISTANT Resistant     GENTAMICIN <=0.5 SENSITIVE Sensitive     OXACILLIN >=4 RESISTANT Resistant     TETRACYCLINE <=1 SENSITIVE Sensitive     VANCOMYCIN 1 SENSITIVE Sensitive     TRIMETH/SULFA <=10 SENSITIVE Sensitive     CLINDAMYCIN >=8 RESISTANT Resistant     RIFAMPIN <=0.5 SENSITIVE Sensitive     Inducible Clindamycin NEGATIVE Sensitive     * RARE METHICILLIN RESISTANT STAPHYLOCOCCUS AUREUS  Body fluid culture     Status: None   Collection Time: 07/29/19 12:54 PM   Specimen: Synovium; Synovial Fluid  Result Value Ref Range Status   Specimen Description SYNOVIAL FLUID  Final   Special Requests LEFT L4 L5 FACET JOINT  Final   Gram Stain   Final    FEW WBC PRESENT, PREDOMINANTLY PMN NO ORGANISMS SEEN    Culture   Final    NO GROWTH 3 DAYS Performed at [facility], [address], [place], [place] [address]    Report Status 08/01/2019 FINAL  Final  Blood culture (routine x 2)     Status: None (Preliminary result)   Collection Time: 08/03/19  4:59 AM   Specimen: BLOOD  Result Value Ref Range Status   Specimen Description BLOOD LEFT ANTECUBITAL  Final   Special Requests   Final    BOTTLES DRAWN AEROBIC AND ANAEROBIC Blood Culture results may not be optimal due to an inadequate volume of blood received in culture bottles   Culture NO GROWTH < 12  HOURS  Final   Report Status PENDING  Incomplete  SARS CORONAVIRUS 2 (TAT 6-24 HRS) Nasopharyngeal Nasopharyngeal Swab     Status: None   Collection Time: 08/03/19  7:50 AM   Specimen: Nasopharyngeal Swab  Result Value Ref Range Status   SARS Coronavirus 2 NEGATIVE NEGATIVE Final    Comment: (NOTE) SARS-CoV-2 target nucleic acids are NOT DETECTED. The SARS-CoV-2 RNA is generally detectable in upper and lower respiratory specimens during the acute phase of infection. Negative results do not preclude SARS-CoV-2 infection, do not rule out co-infections with other pathogens, and should not be used as the sole basis for treatment or other patient management decisions. Negative results must be combined with clinical observations, patient history, and epidemiological information. The expected result is Negative. Fact Sheet for Patients: SugarRoll.be Fact Sheet for Healthcare Providers: https://www.woods-mathews.com/ This test is not yet approved or cleared by the [place] FDA and  has been authorized for detection and/or diagnosis of SARS-CoV-2 by FDA under an Emergency Use Authorization (EUA). This EUA will remain  in effect (meaning this test can be used) for the duration of the COVID-19 declaration under Section 56 4(b)(1) of the Act, 21 U.S.C. section 360bbb-3(b)(1), unless the authorization is terminated or revoked sooner. Performed at [facility], [place] [address], [place], [place] [address]       Radiology Studies: No results found.   LOS: 1 day   Time spent: More than 50% of that time was spent in counseling and/or coordination of care.  Antonieta Pert, MD [facility]  08/04/2019, 8:59 AM

## 2019-08-04 NOTE — ED Notes (Signed)
Pt requesting med for anxiety - message sent to Dr Dayna Barker - order received for Xanax.

## 2019-08-04 NOTE — ED Notes (Signed)
Tele   bfast ordered  

## 2019-08-04 NOTE — ED Notes (Addendum)
Pt woke up - ambulated to bathroom and back to room w/o difficulty. RN unsuccessful w/attempt to obtain labs from [facility]. [facility] re-taped for comfort per pt's request. Pt asking "The IV is in though, right?" Advised pt yes as it flushed well. Phlebotomy to draw. Pt eating breakfast. Pt voiced understanding of tx plan - waiting for bed assignment. NAD.

## 2019-08-05 DIAGNOSIS — F1910 Other psychoactive substance abuse, uncomplicated: Secondary | ICD-10-CM

## 2019-08-05 DIAGNOSIS — B9562 Methicillin resistant Staphylococcus aureus infection as the cause of diseases classified elsewhere: Secondary | ICD-10-CM

## 2019-08-05 DIAGNOSIS — Z888 Allergy status to other drugs, medicaments and biological substances status: Secondary | ICD-10-CM

## 2019-08-05 DIAGNOSIS — M4626 Osteomyelitis of vertebra, lumbar region: Secondary | ICD-10-CM

## 2019-08-05 DIAGNOSIS — Z22322 Carrier or suspected carrier of Methicillin resistant Staphylococcus aureus: Secondary | ICD-10-CM

## 2019-08-05 DIAGNOSIS — D509 Iron deficiency anemia, unspecified: Secondary | ICD-10-CM

## 2019-08-05 LAB — BASIC METABOLIC PANEL
Anion gap: 8 (ref 5–15)
BUN: 9 mg/dL (ref 6–20)
CO2: 27 mmol/L (ref 22–32)
Calcium: 8.7 mg/dL — ABNORMAL LOW (ref 8.9–10.3)
Chloride: 107 mmol/L (ref 98–111)
Creatinine, Ser: 0.47 mg/dL (ref 0.44–1.00)
GFR calc Af Amer: >60 mL/min (ref >60)
GFR calc non Af Amer: >60 mL/min (ref >60)
Glucose, Bld: 98 mg/dL (ref 70–99)
Potassium: 3.9 mmol/L (ref 3.5–5.1)
Sodium: 142 mmol/L (ref 135–145)

## 2019-08-05 LAB — CBC
HCT: 31.4 % — ABNORMAL LOW (ref 36.0–46.0)
Hemoglobin: 9.6 g/dL — ABNORMAL LOW (ref 12.0–15.0)
MCH: 23.8 pg — ABNORMAL LOW (ref 26.0–34.0)
MCHC: 30.6 g/dL (ref 30.0–36.0)
MCV: 77.9 fL — ABNORMAL LOW (ref 80.0–100.0)
Platelets: 242 10*3/uL (ref 150–400)
RBC: 4.03 MIL/uL (ref 3.87–5.11)
RDW: 16.6 % — ABNORMAL HIGH (ref 11.5–15.5)
WBC: 6.6 10*3/uL (ref 4.0–10.5)
nRBC: 0 % (ref 0.0–0.2)

## 2019-08-05 LAB — VANCOMYCIN, TROUGH: Vancomycin Tr: 9 ug/mL — ABNORMAL LOW (ref 15–20)

## 2019-08-05 LAB — VANCOMYCIN, PEAK: Vancomycin Pk: 28 ug/mL — ABNORMAL LOW (ref 30–40)

## 2019-08-05 MED ORDER — FERROUS SULFATE 325 (65 FE) MG PO TABS
325.0000 mg | ORAL_TABLET | Freq: Every day | ORAL | Status: DC
  Administered 2019-08-05 – 2019-08-09 (×5): 325 mg via ORAL
  Filled 2019-08-05 (×5): qty 1

## 2019-08-05 NOTE — Progress Notes (Signed)
Patient ID: Latoya [name], female   DOB: 02/09/1986, 34 y.o.   MRN: [id number]         Latoya [name] for Infectious Disease  Date of Admission:  08/03/2019   Total days of antibiotics 7         ASSESSMENT: She tells me that she now understands that it was a mistake to leave the [name] before finishing therapy for her MRSA lumbar infection.  She says that she is willing to stay in the [name] this time.  PLAN: 1. Continue vancomycin 2. I am okay with replacing PICC  Principal Problem:   Infection of lumbar spine (Latoya [name]) Active Problems:   MRSA (methicillin resistant staph aureus) culture positive   HCV antibody positive   IV drug abuse (H[facility])   Microcytic hypochromic anemia   Scheduled Meds: . buprenorphine-naloxone  1 tablet Sublingual BID  . enoxaparin (LOVENOX) injection  40 mg Subcutaneous Q24H  . ferrous sulfate  325 mg Oral Q breakfast  . sodium chloride flush  3 mL Intravenous Once  . sodium chloride flush  3 mL Intravenous Q12H   Continuous Infusions: . vancomycin 1,250 mg (08/05/19 0945)   PRN Meds:.acetaminophen **OR** acetaminophen, albuterol, ALPRAZolam, ibuprofen, ketorolac, ondansetron **OR** ondansetron (ZOFRAN) IV   SUBJECTIVE: Latoya [name] is feeling better today.  Her back pain is under better control.  She feels like Suboxone is helping.  Review of Systems: Review of Systems  Constitutional: Negative for fever.  Gastrointestinal: Negative for abdominal pain, diarrhea, nausea and vomiting.  Musculoskeletal: Positive for back pain.    Allergies  Allergen Reactions  . Naltrexone Anxiety    Severe anxiety, restlessness, vomiting    OBJECTIVE: Vitals:   08/04/19 1611 08/05/19 0018 08/05/19 0736 08/05/19 0931  BP: (!) 142/96 123/80 (!) 117/91 (!) 125/95  Pulse: 96 88 83 84  Resp: 16 16 16 18   Temp: 99.3 F (37.4 C) 98.8 F (37.1 C) 98.6 F (37 C)   TempSrc: Oral Oral Oral   SpO2: 100% 95% 97% 98%  Weight:      Height:       Body mass  index is 26.63 kg/m.  Physical Exam Constitutional:      Comments: She is sitting on the side of the bed.  She appears comfortable.  Cardiovascular:     Rate and Rhythm: Normal rate and regular rhythm.     Heart sounds: No murmur.  Pulmonary:     Effort: Pulmonary effort is normal.     Breath sounds: Normal breath sounds.     Lab Results Lab Results  Component Value Date   WBC 6.6 08/05/2019   HGB 9.6 (L) 08/05/2019   HCT 31.4 (L) 08/05/2019   MCV 77.9 (L) 08/05/2019   PLT 242 08/05/2019    Lab Results  Component Value Date   CREATININE 0.47 08/05/2019   BUN 9 08/05/2019   NA 142 08/05/2019   K 3.9 08/05/2019   CL 107 08/05/2019   CO2 27 08/05/2019    Lab Results  Component Value Date   ALT 16 08/03/2019   AST 20 08/03/2019   ALKPHOS 65 08/03/2019   BILITOT 0.5 08/03/2019     Microbiology: Recent Results (from the past 240 hour(s))  Blood culture (routine x 2)     Status: None   Collection Time: 07/28/19  7:30 PM   Specimen: BLOOD RIGHT ARM  Result Value Ref Range Status   Specimen Description BLOOD RIGHT ARM  Final   Special Requests   Final  BOTTLES DRAWN AEROBIC AND ANAEROBIC Blood Culture adequate volume   Culture   Final    NO GROWTH 5 DAYS Performed at [facility], [address], [place], [place] [address]    Report Status 08/02/2019 FINAL  Final  Blood culture (routine x 2)     Status: Abnormal   Collection Time: 07/28/19  7:30 PM   Specimen: BLOOD LEFT ARM  Result Value Ref Range Status   Specimen Description BLOOD LEFT ARM  Final   Special Requests   Final    BOTTLES DRAWN AEROBIC AND ANAEROBIC Blood Culture adequate volume   Culture  Setup Time   Final    GRAM POSITIVE COCCI IN CHAINS ANAEROBIC BOTTLE ONLY CRITICAL RESULT CALLED TO, READ BACK BY AND VERIFIED WITH: PHARMD MINH P. [id number]    Culture (A)  Final    VIRIDANS STREPTOCOCCUS THE SIGNIFICANCE OF ISOLATING THIS ORGANISM FROM A SINGLE SET OF BLOOD CULTURES WHEN  MULTIPLE SETS ARE DRAWN IS UNCERTAIN. PLEASE NOTIFY THE MICROBIOLOGY DEPARTMENT WITHIN ONE WEEK IF SPECIATION AND SENSITIVITIES ARE REQUIRED. Performed at [facility], [address] [place], [place] [address]    Report Status 07/31/2019 FINAL  Final  SARS CORONAVIRUS 2 (TAT 6-24 HRS) Nasopharyngeal Nasopharyngeal Swab     Status: None   Collection Time: 07/29/19 12:00 AM   Specimen: Nasopharyngeal Swab  Result Value Ref Range Status   SARS Coronavirus 2 NEGATIVE NEGATIVE Final    Comment: (NOTE) SARS-CoV-2 target nucleic acids are NOT DETECTED. The SARS-CoV-2 RNA is generally detectable in upper and lower respiratory specimens during the acute phase of infection. Negative results do not preclude SARS-CoV-2 infection, do not rule out co-infections with other pathogens, and should not be used as the sole basis for treatment or other patient management decisions. Negative results must be combined with clinical observations, patient history, and epidemiological information. The expected result is Negative. Fact Sheet for Patients: HairSlick.no Fact Sheet for Healthcare Providers: quierodirigir.com This test is not yet approved or cleared by the [place] FDA and  has been authorized for detection and/or diagnosis of SARS-CoV-2 by FDA under an Emergency Use Authorization (EUA). This EUA will remain  in effect (meaning this test can be used) for the duration of the COVID-19 declaration under Section 56 4(b)(1) of the Act, 21 U.S.C. section 360bbb-3(b)(1), unless the authorization is terminated or revoked sooner. Performed at [facility], [address], [place], [place] [address]   Aerobic/Anaerobic Culture (surgical/deep wound)     Status: None   Collection Time: 07/29/19 12:54 PM   Specimen: Synovium; Synovial Fluid  Result Value Ref Range Status   Specimen Description SYNOVIAL FLUID  Final   Special Requests  RIGHT L4 L5 FACET JOINT  Final   Gram Stain   Final    RARE WBC PRESENT,BOTH PMN AND MONONUCLEAR NO ORGANISMS SEEN    Culture   Final    RARE METHICILLIN RESISTANT STAPHYLOCOCCUS AUREUS NO ANAEROBES ISOLATED CRITICAL RESULT CALLED TO, READ BACK BY AND VERIFIED WITH: RN Elmyra Ricks [phone] FCP Performed at [name], [address] [place], [place] [address]    Report Status 08/03/2019 FINAL  Final   Organism ID, Bacteria METHICILLIN RESISTANT STAPHYLOCOCCUS AUREUS  Final      Susceptibility   Methicillin resistant staphylococcus aureus - MIC*    CIPROFLOXACIN >=8 RESISTANT Resistant     ERYTHROMYCIN >=8 RESISTANT Resistant     GENTAMICIN <=0.5 SENSITIVE Sensitive  OXACILLIN >=4 RESISTANT Resistant     TETRACYCLINE <=1 SENSITIVE Sensitive     VANCOMYCIN 1 SENSITIVE Sensitive     TRIMETH/SULFA <=10 SENSITIVE Sensitive     CLINDAMYCIN >=8 RESISTANT Resistant     RIFAMPIN <=0.5 SENSITIVE Sensitive     Inducible Clindamycin NEGATIVE Sensitive     * RARE METHICILLIN RESISTANT STAPHYLOCOCCUS AUREUS  Body fluid culture     Status: None   Collection Time: 07/29/19 12:54 PM   Specimen: Synovium; Synovial Fluid  Result Value Ref Range Status   Specimen Description SYNOVIAL FLUID  Final   Special Requests LEFT L4 L5 FACET JOINT  Final   Gram Stain   Final    FEW WBC PRESENT, PREDOMINANTLY PMN NO ORGANISMS SEEN    Culture   Final    NO GROWTH 3 DAYS Performed at [facility], [address] [place], [place] [address]    Report Status 08/01/2019 FINAL  Final  Blood culture (routine x 2)     Status: None (Preliminary result)   Collection Time: 08/03/19  4:59 AM   Specimen: BLOOD  Result Value Ref Range Status   Specimen Description BLOOD LEFT ANTECUBITAL  Final   Special Requests   Final    BOTTLES DRAWN AEROBIC AND ANAEROBIC Blood Culture results may not be optimal due to an inadequate volume of blood received in culture bottles   Culture   Final    NO GROWTH 1  DAY Performed at [facility], [address], [place], [place] [address]    Report Status PENDING  Incomplete  SARS CORONAVIRUS 2 (TAT 6-24 HRS) Nasopharyngeal Nasopharyngeal Swab     Status: None   Collection Time: 08/03/19  7:50 AM   Specimen: Nasopharyngeal Swab  Result Value Ref Range Status   SARS Coronavirus 2 NEGATIVE NEGATIVE Final    Comment: (NOTE) SARS-CoV-2 target nucleic acids are NOT DETECTED. The SARS-CoV-2 RNA is generally detectable in upper and lower respiratory specimens during the acute phase of infection. Negative results do not preclude SARS-CoV-2 infection, do not rule out co-infections with other pathogens, and should not be used as the sole basis for treatment or other patient management decisions. Negative results must be combined with clinical observations, patient history, and epidemiological information. The expected result is Negative. Fact Sheet for Patients: HairSlick.no Fact Sheet for Healthcare Providers: quierodirigir.com This test is not yet approved or cleared by the [place] FDA and  has been authorized for detection and/or diagnosis of SARS-CoV-2 by FDA under an Emergency Use Authorization (EUA). This EUA will remain  in effect (meaning this test can be used) for the duration of the COVID-19 declaration under Section 56 4(b)(1) of the Act, 21 U.S.C. section 360bbb-3(b)(1), unless the authorization is terminated or revoked sooner. Performed at [facility], [address], [place], [place] [address]   Blood culture (routine x 2)     Status: None (Preliminary result)   Collection Time: 08/03/19  9:08 AM   Specimen: BLOOD  Result Value Ref Range Status   Specimen Description BLOOD SITE NOT SPECIFIED  Final   Special Requests   Final    BOTTLES DRAWN AEROBIC ONLY Blood Culture results may not be optimal due to an inadequate volume of blood received in culture bottles    Culture   Final    NO GROWTH 1 DAY Performed at [facility], [address], [place], [place] [address]    Report Status PENDING  Incomplete  Cliffton Asters, MD [name]ious [facility] [phone] pager   [phone] cell 08/05/2019, 11:20 AM

## 2019-08-05 NOTE — Plan of Care (Signed)

## 2019-08-05 NOTE — Progress Notes (Addendum)
Pharmacy Antibiotic Note  Latoya [name] is a 34 y.o. female admitted on 08/03/2019 with MRSA lumbar infection in the setting of IV drug use (pt had left AMA on 08/02/19). Pharmacy has been consulted for vancomycin dosing.  Vancomycin levels after 09:45 AM dose of vancomycin (1250 mg IV Q 12 hr) were as follows: Vancomycin peak (drawn at 12:21 PM) was 28 mg/L Vancomycin trough (drawn at 21:06 PM) was 9 mg/L Vancomycin AUC calculated from these levels is 432.4, which is within the goal AUC range of 400-550  WBC 4.03, afebrile, Scr 0.47, CrCl >100 ml/min (renal function stable)  Plan: Continue vancomycin 1250 mg IV Q 12 hrs (6 weeks planned) Monitor WBC, temp, clinical improvement, cultures, renal function, vancomycin levels  Height: 5\' 6"  (167.6 cm) Weight: 165 lb (74.8 kg) IBW/kg (Calculated) : 59.3  Temp (24hrs), Avg:98.7 F (37.1 C), Min:98.6 F (37 C), Max:98.8 F (37.1 C)  Recent Labs  Lab 07/30/19 0304 07/30/19 0304 08/01/19 0218 08/02/19 0810 08/02/19 0814 08/03/19 0458 08/04/19 0808 08/05/19 0311 08/05/19 1221 08/05/19 2106  WBC 6.7  --   --   --   --  7.3  --  6.6  --   --   CREATININE 0.51   < > 0.60 0.55  --  0.60 0.56 0.47  --   --   LATICACIDVEN  --   --   --   --   --  1.1  --   --   --   --   VANCOTROUGH  --   --   --   --   --   --   --   --   --  9*  VANCOPEAK  --   --   --   --  22*  --   --   --  28*  --    < > = values in this interval not displayed.    Estimated Creatinine Clearance: 104.4 mL/min (by C-G formula based on SCr of 0.47 mg/dL).    Allergies  Allergen Reactions  . Naltrexone Anxiety    Severe anxiety, restlessness, vomiting   Microbiology Results: 3/29 COVID: negative 3/29 Bld cx X 2 sets: NGTD 3/24 Synovial fluid (R L4 L5 facet joint): rare MRSA  4/24, PharmD, BCPS, [facility] Clinical Pharmcist

## 2019-08-05 NOTE — Progress Notes (Signed)
PROGRESS NOTE    Latoya [name]  QIH:[id number] DOB: 1986/02/08 DOA: 08/03/2019 PCP: Patient, No Pcp Per   Brief Narrative: 34 y.o. female with medical history significant of IV heroin abuse and L4-5 septic arthritis with MRSA presents with complaints of back pain after leaving AMA from the hospital on 3/28. She was admitted on 3/23 for sepsis secondary to L4-5 facet septic arthritis-s/p fluoroscopy guided aspiration and cultures grew MRSA, seen by ID and was planned for 6 weeks of IV vancomycin. She reports she had to leave AMA and apparently admitted using heroin but was having severe lumbar back pain that was sharp and radiating so came to the ER. Patient usually injects herself in the lower extremities. Upon admission into the emergency department patient was noted to be afebrile, tachycardic, and all other vital signs maintained. Blood cultures were again obtained. Patient admitted for further management  Subjective: Patient seen and examined at bedside.  Denies any new complaints.   Assessment & Plan:  Recently diagnosed L4-5 facet joint MRSA infection in the setting of IV drug abuse Patient left AMA and readmitted, seen by ID, continue IV vancomycin   ID and pharmacy will follow and dose vancomycin Follow repeat blood cultures.  IV heroin abuse/substance abuse Patient placed back on Suboxone Continue counseling on cessation of heroin  Microcytic hypochromic anemia/iron deficiency anemia Chronic, hemoglobin is stable Continue oral iron supplementation  Hx of Hepatitis C  LFTs stable  Nutrition: Diet Order            Diet regular Room service appropriate? Yes; Fluid consistency: Thin  Diet effective now              DVT prophylaxis:lovenox Code Status:FULL  Family Communication: plan of care discussed with patient at bedside. Disposition Plan: Patient is from:Home Anticipated Disposition:TBD Barriers to discharge or conditions that needs to be met prior to  discharge: Patient readmitted with facet joint MRSA infection, remains on IV antibiotics disposition difficult due to need for prolonged IV antibiotics, tendency to leave [place], social worker to follow once disposition decided.  Consultants: Infectious disease.    Medications: Scheduled Meds: . buprenorphine-naloxone  1 tablet Sublingual BID  . enoxaparin (LOVENOX) injection  40 mg Subcutaneous Q24H  . ferrous sulfate  325 mg Oral Q breakfast  . sodium chloride flush  3 mL Intravenous Once  . sodium chloride flush  3 mL Intravenous Q12H   Continuous Infusions: . vancomycin 1,250 mg (08/05/19 0945)    Antimicrobials: Anti-infectives (From admission, onward)   Start     Dose/Rate Route Frequency Ordered Stop   08/03/19 0630  vancomycin (VANCOREADY) IVPB 1250 mg/250 mL     1,250 mg 166.7 mL/hr over 90 Minutes Intravenous Every 12 hours 08/03/19 0622         Objective: Vitals: Today's Vitals   08/05/19 0018 08/05/19 0736 08/05/19 0931 08/05/19 1200  BP: 123/80 (!) 117/91 (!) 125/95   Pulse: 88 83 84   Resp: _0 Temp: 98.8 F (37.1 C) 98.6 F (37 C)    TempSrc: Oral Oral    SpO2: 95% 97% 98%   Weight:      Height:      PainSc:   5  0-No pain    Intake/Output Summary (Last 24 hours) at 08/05/2019 1720 Last data filed at 08/05/2019 1209 Gross per 24 hour  Intake 999.27 ml  Output --  Net 999.27 ml   Filed Weights   08/03/19 0450 08/03/19 8756  Weight: 74.8 kg 74.8 kg   Weight change:    Intake/Output from previous day: No intake/output data recorded. Intake/Output this shift: Total I/O In: 999.3 [P.O.:240; IV Piggyback:759.3] Out: -   Examination:  General: NAD   Cardiovascular: S1, S2 present  Respiratory: CTAB  Abdomen: Soft, nontender, nondistended, bowel sounds present  Musculoskeletal: No bilateral pedal edema noted  Skin: Normal  Psychiatry: Normal mood   Data Reviewed: I have personally reviewed following labs and  imaging studies CBC: Recent Labs  Lab 07/30/19 0304 08/03/19 0458 08/05/19 0311  WBC 6.7 7.3 6.6  HGB 9.4* 11.0* 9.6*  HCT 31.0* 36.7 31.4*  MCV 76.5* 79.3* 77.9*  PLT 173 330 413   Basic Metabolic Panel: Recent Labs  Lab 08/01/19 0218 08/02/19 0810 08/03/19 0458 08/04/19 0808 08/05/19 0311  NA 142 140 139 140 142  K 3.9 3.6 3.3* 4.2 3.9  CL 109 108 103 104 107  CO2 _0 GLUCOSE 111* 93 99 93 98  BUN _1 CREATININE 0.60 0.55 0.60 0.56 0.47  CALCIUM 8.4* 8.6* 9.1 8.8* 8.7*   GFR: Estimated Creatinine Clearance: 104.4 mL/min (by C-G formula based on SCr of 0.47 mg/dL). Liver Function Tests: Recent Labs  Lab 07/30/19 0304 08/03/19 0458  AST 14* 20  ALT 15 16  ALKPHOS 50 65  BILITOT 0.5 0.5  PROT 5.8* 7.7  ALBUMIN 2.4* 3.5   No results for input(s): LIPASE, AMYLASE in the last 168 hours. No results for input(s): AMMONIA in the last 168 hours. Coagulation Profile: No results for input(s): INR, PROTIME in the last 168 hours. Cardiac Enzymes: No results for input(s): CKTOTAL, CKMB, CKMBINDEX, TROPONINI in the last 168 hours. BNP (last 3 results) No results for input(s): PROBNP in the last 8760 hours. HbA1C: No results for input(s): HGBA1C in the last 72 hours. CBG: No results for input(s): GLUCAP in the last 168 hours. Lipid Profile: No results for input(s): CHOL, HDL, LDLCALC, TRIG, CHOLHDL, LDLDIRECT in the last 72 hours. Thyroid Function Tests: No results for input(s): TSH, T4TOTAL, FREET4, T3FREE, THYROIDAB in the last 72 hours. Anemia Panel: Recent Labs    08/03/19 0925  FERRITIN 51  TIBC 312  IRON 22*   Sepsis Labs: Recent Labs  Lab 08/03/19 0458  LATICACIDVEN 1.1    Recent Results (from the past 240 hour(s))  Blood culture (routine x 2)     Status: None   Collection Time: 07/28/19  7:30 PM   Specimen: BLOOD RIGHT ARM  Result Value Ref Range Status   Specimen Description BLOOD RIGHT ARM  Final   Special Requests    Final    BOTTLES DRAWN AEROBIC AND ANAEROBIC Blood Culture adequate volume   Culture   Final    NO GROWTH 5 DAYS Performed at [facility], [place] [address], [place], [place] [address]    Report Status 08/02/2019 FINAL  Final  Blood culture (routine x 2)     Status: Abnormal   Collection Time: 07/28/19  7:30 PM   Specimen: BLOOD LEFT ARM  Result Value Ref Range Status   Specimen Description BLOOD LEFT ARM  Final   Special Requests   Final    BOTTLES DRAWN AEROBIC AND ANAEROBIC Blood Culture adequate volume   Culture  Setup Time   Final    GRAM POSITIVE COCCI IN CHAINS ANAEROBIC BOTTLE ONLY CRITICAL RESULT CALLED TO, READ BACK BY AND VERIFIED WITH: [place] P. [place]  Culture (A)  Final    VIRIDANS STREPTOCOCCUS THE SIGNIFICANCE OF ISOLATING THIS ORGANISM FROM A SINGLE SET OF BLOOD CULTURES WHEN MULTIPLE SETS ARE DRAWN IS UNCERTAIN. PLEASE NOTIFY THE MICROBIOLOGY DEPARTMENT WITHIN ONE WEEK IF SPECIATION AND SENSITIVITIES ARE REQUIRED. Performed at [facility], [place] [address], [place], [place] [address]    Report Status 07/31/2019 FINAL  Final  SARS CORONAVIRUS 2 (TAT 6-24 HRS) Nasopharyngeal Nasopharyngeal Swab     Status: None   Collection Time: 07/29/19 12:00 AM   Specimen: Nasopharyngeal Swab  Result Value Ref Range Status   SARS Coronavirus 2 NEGATIVE NEGATIVE Final    Comment: (NOTE) SARS-CoV-2 target nucleic acids are NOT DETECTED. The SARS-CoV-2 RNA is generally detectable in upper and lower respiratory specimens during the acute phase of infection. Negative results do not preclude SARS-CoV-2 infection, do not rule out co-infections with other pathogens, and should not be used as the sole basis for treatment or other patient management decisions. Negative results must be combined with clinical observations, patient history, and epidemiological information. The expected result is Negative. Fact Sheet for  Patients: SugarRoll.be Fact Sheet for Healthcare Providers: https://www.woods-mathews.com/ This test is not yet approved or cleared by the [place] FDA and  has been authorized for detection and/or diagnosis of SARS-CoV-2 by FDA under an Emergency Use Authorization (EUA). This EUA will remain  in effect (meaning this test can be used) for the duration of the COVID-19 declaration under Section 56 4(b)(1) of the Act, 21 U.S.C. section 360bbb-3(b)(1), unless the authorization is terminated or revoked sooner. Performed at [facility], [place] [address], [place], [place] [address]   Aerobic/Anaerobic Culture (surgical/deep wound)     Status: None   Collection Time: 07/29/19 12:54 PM   Specimen: Synovium; Synovial Fluid  Result Value Ref Range Status   Specimen Description SYNOVIAL FLUID  Final   Special Requests RIGHT L4 L5 FACET JOINT  Final   Gram Stain   Final    RARE WBC PRESENT,BOTH PMN AND MONONUCLEAR NO ORGANISMS SEEN    Culture   Final    RARE METHICILLIN RESISTANT STAPHYLOCOCCUS AUREUS NO ANAEROBES ISOLATED CRITICAL RESULT CALLED TO, READ BACK BY AND VERIFIED WITH: RN Julianne Rice [phone] FCP Performed at [facility], [place] [address] [place], [place] [address]    Report Status 08/03/2019 FINAL  Final   Organism ID, Bacteria METHICILLIN RESISTANT STAPHYLOCOCCUS AUREUS  Final      Susceptibility   Methicillin resistant staphylococcus aureus - MIC*    CIPROFLOXACIN >=8 RESISTANT Resistant     ERYTHROMYCIN >=8 RESISTANT Resistant     GENTAMICIN <=0.5 SENSITIVE Sensitive     OXACILLIN >=4 RESISTANT Resistant     TETRACYCLINE <=1 SENSITIVE Sensitive     VANCOMYCIN 1 SENSITIVE Sensitive     TRIMETH/SULFA <=10 SENSITIVE Sensitive     CLINDAMYCIN >=8 RESISTANT Resistant     RIFAMPIN <=0.5 SENSITIVE Sensitive     Inducible Clindamycin NEGATIVE Sensitive     * RARE METHICILLIN RESISTANT STAPHYLOCOCCUS AUREUS  Body fluid  culture     Status: None   Collection Time: 07/29/19 12:54 PM   Specimen: Synovium; Synovial Fluid  Result Value Ref Range Status   Specimen Description SYNOVIAL FLUID  Final   Special Requests LEFT L4 L5 FACET JOINT  Final   Gram Stain   Final    FEW WBC PRESENT, PREDOMINANTLY PMN NO ORGANISMS SEEN    Culture   Final    NO GROWTH  3 DAYS Performed at [facility], [place] [address] [place], [place] [address]    Report Status 08/01/2019 FINAL  Final  Blood culture (routine x 2)     Status: None (Preliminary result)   Collection Time: 08/03/19  4:59 AM   Specimen: BLOOD  Result Value Ref Range Status   Specimen Description BLOOD LEFT ANTECUBITAL  Final   Special Requests   Final    BOTTLES DRAWN AEROBIC AND ANAEROBIC Blood Culture results may not be optimal due to an inadequate volume of blood received in culture bottles   Culture   Final    NO GROWTH 2 DAYS Performed at [facility], [place] [address], [place], [name] [address]    Report Status PENDING  Incomplete  SARS CORONAVIRUS 2 (TAT 6-24 HRS) Nasopharyngeal Nasopharyngeal Swab     Status: None   Collection Time: 08/03/19  7:50 AM   Specimen: Nasopharyngeal Swab  Result Value Ref Range Status   SARS Coronavirus 2 NEGATIVE NEGATIVE Final    Comment: (NOTE) SARS-CoV-2 target nucleic acids are NOT DETECTED. The SARS-CoV-2 RNA is generally detectable in upper and lower respiratory specimens during the acute phase of infection. Negative results do not preclude SARS-CoV-2 infection, do not rule out co-infections with other pathogens, and should not be used as the sole basis for treatment or other patient management decisions. Negative results must be combined with clinical observations, patient history, and epidemiological information. The expected result is Negative. Fact Sheet for Patients: SugarRoll.be Fact Sheet for Healthcare  Providers: https://www.woods-mathews.com/ This test is not yet approved or cleared by the [place] FDA and  has been authorized for detection and/or diagnosis of SARS-CoV-2 by FDA under an Emergency Use Authorization (EUA). This EUA will remain  in effect (meaning this test can be used) for the duration of the COVID-19 declaration under Section 56 4(b)(1) of the Act, 21 U.S.C. section 360bbb-3(b)(1), unless the authorization is terminated or revoked sooner. Performed at [facility], [place] [address] [place], [place] [address]   Blood culture (routine x 2)     Status: None (Preliminary result)   Collection Time: 08/03/19  9:08 AM   Specimen: BLOOD  Result Value Ref Range Status   Specimen Description BLOOD SITE NOT SPECIFIED  Final   Special Requests   Final    BOTTLES DRAWN AEROBIC ONLY Blood Culture results may not be optimal due to an inadequate volume of blood received in culture bottles   Culture   Final    NO GROWTH 2 DAYS Performed at [facility], [place] [address], [place],  [address]    Report Status PENDING  Incomplete      Radiology Studies: No results found.   LOS: 2 days   Time spent: More than 50% of that time was spent in counseling and/or coordination of care.  Alma Friendly, MD [facility]  08/05/2019, 5:20 PM

## 2019-08-06 ENCOUNTER — Inpatient Hospital Stay: Payer: Self-pay

## 2019-08-06 LAB — MRSA PCR SCREENING: MRSA by PCR: POSITIVE — AB

## 2019-08-06 MED ORDER — SODIUM CHLORIDE 0.9% FLUSH
10.0000 mL | Freq: Two times a day (BID) | INTRAVENOUS | Status: DC
  Administered 2019-08-07: 20 mL
  Administered 2019-08-07 – 2019-08-08 (×3): 10 mL

## 2019-08-06 MED ORDER — SODIUM CHLORIDE 0.9% FLUSH: 10.0000 mL | INTRAVENOUS | Status: DC | PRN

## 2019-08-06 MED ORDER — CHLORHEXIDINE GLUCONATE CLOTH 2 % EX PADS
6.0000 | MEDICATED_PAD | Freq: Every day | CUTANEOUS | Status: DC
  Administered 2019-08-06 – 2019-08-08 (×3): 6 via TOPICAL

## 2019-08-06 NOTE — Progress Notes (Signed)
PROGRESS NOTE    Latoya [name]  HQP:[id number] DOB: Jan 29, 1986 DOA: 08/03/2019 PCP: Patient, No Pcp Per   Brief Narrative: 34 y.o. female with medical history significant of IV heroin abuse and L4-5 septic arthritis with MRSA presents with complaints of back pain after leaving AMA from the hospital on 3/28. She was admitted on 3/23 for sepsis secondary to L4-5 facet septic arthritis-s/p fluoroscopy guided aspiration and cultures grew MRSA, seen by ID and was planned for 6 weeks of IV vancomycin. She reports she had to leave AMA and apparently admitted using heroin but was having severe lumbar back pain that was sharp and radiating so came to the ER. Patient usually injects herself in the lower extremities. Upon admission into the emergency department patient was noted to be afebrile, tachycardic, and all other vital signs maintained. Blood cultures were again obtained. Patient admitted for further management  Subjective: Patient seen and examined at bedside.  Noted to be crying, complaining of back pain.  Denies any other new complaints.   Assessment & Plan:  Recently diagnosed L4-5 facet joint MRSA infection in the setting of IV drug abuse  Patient left AMA and readmitted, seen by ID, continue IV vancomycin   ID and pharmacy will follow and dose vancomycin Follow repeat blood cultures.  IV heroin abuse/substance abuse Patient placed back on Suboxone Continue counseling on cessation of heroin  Microcytic hypochromic anemia/iron deficiency anemia Chronic, hemoglobin is stable Continue oral iron supplementation  Hx of Hepatitis C  LFTs stable  Nutrition: Diet Order            Diet regular Room service appropriate? Yes; Fluid consistency: Thin  Diet effective now              DVT prophylaxis:lovenox Code Status:FULL  Family Communication: plan of care discussed with patient at bedside. Disposition Plan: Patient is from:Home Anticipated Disposition:TBD Barriers to  discharge or conditions that needs to be met prior to discharge: Patient readmitted with facet joint MRSA infection, remains on IV antibiotics disposition difficult due to need for prolonged IV antibiotics, tendency to leave [place], social worker to follow once disposition decided.  Consultants: Infectious disease.    Medications: Scheduled Meds: . buprenorphine-naloxone  1 tablet Sublingual BID  . enoxaparin (LOVENOX) injection  40 mg Subcutaneous Q24H  . ferrous sulfate  325 mg Oral Q breakfast  . sodium chloride flush  3 mL Intravenous Once  . sodium chloride flush  3 mL Intravenous Q12H   Continuous Infusions: . vancomycin 1,250 mg (08/06/19 1123)    Antimicrobials: Anti-infectives (From admission, onward)   Start     Dose/Rate Route Frequency Ordered Stop   08/03/19 0630  vancomycin (VANCOREADY) IVPB 1250 mg/250 mL     1,250 mg 166.7 mL/hr over 90 Minutes Intravenous Every 12 hours 08/03/19 0622         Objective: Vitals: Today's Vitals   08/05/19 2120 08/05/19 2331 08/05/19 2333 08/06/19 0812  BP:   118/77 118/88  Pulse:   78 80  Resp:   16 19  Temp:   (!) 97.5 F (36.4 C) (!) 97.5 F (36.4 C)  TempSrc:      SpO2:   95% 99%  Weight:      Height:      PainSc: 0-No pain Asleep     No intake or output data in the 24 hours ending 08/06/19 1719 Filed Weights   08/03/19 0450 08/03/19 0624  Weight: 74.8 kg 74.8 kg   Weight change:  Intake/Output from previous day: 03/31 0701 - 04/01 0700 In: 999.3 [P.O.:240; IV Piggyback:759.3] Out: -  Intake/Output this shift: No intake/output data recorded.  Examination:  General: NAD   Cardiovascular: S1, S2 present  Respiratory: CTAB  Abdomen: Soft, nontender, nondistended, bowel sounds present  Musculoskeletal: No bilateral pedal edema noted  Skin: Normal  Psychiatry: Normal mood   Data Reviewed: I have personally reviewed following labs and imaging studies CBC: Recent Labs  Lab  08/03/19 0458 08/05/19 0311  WBC 7.3 6.6  HGB 11.0* 9.6*  HCT 36.7 31.4*  MCV 79.3* 77.9*  PLT 330 350   Basic Metabolic Panel: Recent Labs  Lab 08/01/19 0218 08/02/19 0810 08/03/19 0458 08/04/19 0808 08/05/19 0311  NA 142 140 139 140 142  K 3.9 3.6 3.3* 4.2 3.9  CL 109 108 103 104 107  CO2 '23 23 29 27 27  ' GLUCOSE 111* 93 99 93 98  BUN '13 10 6 9 9  ' CREATININE 0.60 0.55 0.60 0.56 0.47  CALCIUM 8.4* 8.6* 9.1 8.8* 8.7*   GFR: Estimated Creatinine Clearance: 104.4 mL/min (by C-G formula based on SCr of 0.47 mg/dL). Liver Function Tests: Recent Labs  Lab 08/03/19 0458  AST 20  ALT 16  ALKPHOS 65  BILITOT 0.5  PROT 7.7  ALBUMIN 3.5   No results for input(s): LIPASE, AMYLASE in the last 168 hours. No results for input(s): AMMONIA in the last 168 hours. Coagulation Profile: No results for input(s): INR, PROTIME in the last 168 hours. Cardiac Enzymes: No results for input(s): CKTOTAL, CKMB, CKMBINDEX, TROPONINI in the last 168 hours. BNP (last 3 results) No results for input(s): PROBNP in the last 8760 hours. HbA1C: No results for input(s): HGBA1C in the last 72 hours. CBG: No results for input(s): GLUCAP in the last 168 hours. Lipid Profile: No results for input(s): CHOL, HDL, LDLCALC, TRIG, CHOLHDL, LDLDIRECT in the last 72 hours. Thyroid Function Tests: No results for input(s): TSH, T4TOTAL, FREET4, T3FREE, THYROIDAB in the last 72 hours. Anemia Panel: No results for input(s): VITAMINB12, FOLATE, FERRITIN, TIBC, IRON, RETICCTPCT in the last 72 hours. Sepsis Labs: Recent Labs  Lab 08/03/19 0458  LATICACIDVEN 1.1    Recent Results (from the past 240 hour(s))  Blood culture (routine x 2)     Status: None   Collection Time: 07/28/19  7:30 PM   Specimen: BLOOD RIGHT ARM  Result Value Ref Range Status   Specimen Description BLOOD RIGHT ARM  Final   Special Requests   Final    BOTTLES DRAWN AEROBIC AND ANAEROBIC Blood Culture adequate volume   Culture    Final    NO GROWTH 5 DAYS Performed at [facility], [address], [place], [place] [address]    Report Status 08/02/2019 FINAL  Final  Blood culture (routine x 2)     Status: Abnormal   Collection Time: 07/28/19  7:30 PM   Specimen: BLOOD LEFT ARM  Result Value Ref Range Status   Specimen Description BLOOD LEFT ARM  Final   Special Requests   Final    BOTTLES DRAWN AEROBIC AND ANAEROBIC Blood Culture adequate volume   Culture  Setup Time   Final    GRAM POSITIVE COCCI IN CHAINS ANAEROBIC BOTTLE ONLY CRITICAL RESULT CALLED TO, READ BACK BY AND VERIFIED WITH: PHARMD [place] P. [id number]    Culture (A)  Final    VIRIDANS STREPTOCOCCUS THE SIGNIFICANCE OF ISOLATING THIS ORGANISM FROM A SINGLE SET OF BLOOD CULTURES WHEN MULTIPLE SETS  ARE DRAWN IS UNCERTAIN. PLEASE NOTIFY THE MICROBIOLOGY DEPARTMENT WITHIN ONE WEEK IF SPECIATION AND SENSITIVITIES ARE REQUIRED. Performed at [facility], [place] [address], [place], [place] [address]    Report Status 07/31/2019 FINAL  Final  SARS CORONAVIRUS 2 (TAT 6-24 HRS) Nasopharyngeal Nasopharyngeal Swab     Status: None   Collection Time: 07/29/19 12:00 AM   Specimen: Nasopharyngeal Swab  Result Value Ref Range Status   SARS Coronavirus 2 NEGATIVE NEGATIVE Final    Comment: (NOTE) SARS-CoV-2 target nucleic acids are NOT DETECTED. The SARS-CoV-2 RNA is generally detectable in upper and lower respiratory specimens during the acute phase of infection. Negative results do not preclude SARS-CoV-2 infection, do not rule out co-infections with other pathogens, and should not be used as the sole basis for treatment or other patient management decisions. Negative results must be combined with clinical observations, patient history, and epidemiological information. The expected result is Negative. Fact Sheet for Patients: SugarRoll.be Fact Sheet for Healthcare  Providers: https://www.woods-mathews.com/ This test is not yet approved or cleared by the [place] FDA and  has been authorized for detection and/or diagnosis of SARS-CoV-2 by FDA under an Emergency Use Authorization (EUA). This EUA will remain  in effect (meaning this test can be used) for the duration of the COVID-19 declaration under Section 56 4(b)(1) of the Act, 21 U.S.C. section 360bbb-3(b)(1), unless the authorization is terminated or revoked sooner. Performed at [facility], [place] [address], [place], [place] [address]   Aerobic/Anaerobic Culture (surgical/deep wound)     Status: None   Collection Time: 07/29/19 12:54 PM   Specimen: Synovium; Synovial Fluid  Result Value Ref Range Status   Specimen Description SYNOVIAL FLUID  Final   Special Requests RIGHT L4 L5 FACET JOINT  Final   Gram Stain   Final    RARE WBC PRESENT,BOTH PMN AND MONONUCLEAR NO ORGANISMS SEEN    Culture   Final    RARE METHICILLIN RESISTANT STAPHYLOCOCCUS AUREUS NO ANAEROBES ISOLATED CRITICAL RESULT CALLED TO, READ BACK BY AND VERIFIED WITH: RN Julianne Rice [id number] FCP Performed at [facility], [place] [address] [place], [place] [address]    Report Status 08/03/2019 FINAL  Final   Organism ID, Bacteria METHICILLIN RESISTANT STAPHYLOCOCCUS AUREUS  Final      Susceptibility   Methicillin resistant staphylococcus aureus - MIC*    CIPROFLOXACIN >=8 RESISTANT Resistant     ERYTHROMYCIN >=8 RESISTANT Resistant     GENTAMICIN <=0.5 SENSITIVE Sensitive     OXACILLIN >=4 RESISTANT Resistant     TETRACYCLINE <=1 SENSITIVE Sensitive     VANCOMYCIN 1 SENSITIVE Sensitive     TRIMETH/SULFA <=10 SENSITIVE Sensitive     CLINDAMYCIN >=8 RESISTANT Resistant     RIFAMPIN <=0.5 SENSITIVE Sensitive     Inducible Clindamycin NEGATIVE Sensitive     * RARE METHICILLIN RESISTANT STAPHYLOCOCCUS AUREUS  Body fluid culture     Status: None   Collection Time: 07/29/19 12:54 PM   Specimen: Synovium;  Synovial Fluid  Result Value Ref Range Status   Specimen Description SYNOVIAL FLUID  Final   Special Requests LEFT L4 L5 FACET JOINT  Final   Gram Stain   Final    FEW WBC PRESENT, PREDOMINANTLY PMN NO ORGANISMS SEEN    Culture   Final    NO GROWTH 3 DAYS Performed at [facility], [address] [place], [place] [address]    Report Status 08/01/2019 FINAL  Final  Blood culture (routine x 2)     Status: None (Preliminary result)   Collection Time: 08/03/19  4:59 AM   Specimen: BLOOD  Result Value Ref Range Status   Specimen Description BLOOD LEFT ANTECUBITAL  Final   Special Requests   Final    BOTTLES DRAWN AEROBIC AND ANAEROBIC Blood Culture results may not be optimal due to an inadequate volume of blood received in culture bottles   Culture   Final    NO GROWTH 3 DAYS Performed at [facility], [place] [address] [place], [place] [address]    Report Status PENDING  Incomplete  SARS CORONAVIRUS 2 (TAT 6-24 HRS) Nasopharyngeal Nasopharyngeal Swab     Status: None   Collection Time: 08/03/19  7:50 AM   Specimen: Nasopharyngeal Swab  Result Value Ref Range Status   SARS Coronavirus 2 NEGATIVE NEGATIVE Final    Comment: (NOTE) SARS-CoV-2 target nucleic acids are NOT DETECTED. The SARS-CoV-2 RNA is generally detectable in upper and lower respiratory specimens during the acute phase of infection. Negative results do not preclude SARS-CoV-2 infection, do not rule out co-infections with other pathogens, and should not be used as the sole basis for treatment or other patient management decisions. Negative results must be combined with clinical observations, patient history, and epidemiological information. The expected result is Negative. Fact Sheet for Patients: SugarRoll.be Fact Sheet for Healthcare Providers: https://www.woods-mathews.com/ This test is not yet approved or cleared by the [place] FDA and  has been  authorized for detection and/or diagnosis of SARS-CoV-2 by FDA under an Emergency Use Authorization (EUA). This EUA will remain  in effect (meaning this test can be used) for the duration of the COVID-19 declaration under Section 56 4(b)(1) of the Act, 21 U.S.C. section 360bbb-3(b)(1), unless the authorization is terminated or revoked sooner. Performed at [facility], [place] [address], [place], [place] [address]   Blood culture (routine x 2)     Status: None (Preliminary result)   Collection Time: 08/03/19  9:08 AM   Specimen: BLOOD  Result Value Ref Range Status   Specimen Description BLOOD SITE NOT SPECIFIED  Final   Special Requests   Final    BOTTLES DRAWN AEROBIC ONLY Blood Culture results may not be optimal due to an inadequate volume of blood received in culture bottles   Culture   Final    NO GROWTH 3 DAYS Performed at [facility], [place] [address], [place], [place] [address]    Report Status PENDING  Incomplete  MRSA PCR Screening     Status: Abnormal   Collection Time: 08/06/19 12:15 PM   Specimen: Nasal Mucosa; Nasopharyngeal  Result Value Ref Range Status   MRSA by PCR POSITIVE (A) NEGATIVE Final    Comment:        The GeneXpert MRSA Assay (FDA approved for NASAL specimens only), is one component of a comprehensive MRSA colonization surveillance program. It is not intended to diagnose MRSA infection nor to guide or monitor treatment for MRSA infections. RESULT CALLED TO, READ BACK BY AND VERIFIED WITH: Torrie Mayers LPN 95:18 12/09/14 ([email or web address]) Performed at [facility], [place] [address], [place], [place] [address]       Radiology Studies: [place] EKG SITE RITE  Result Date: 08/06/2019 If Site Rite image not attached, placement could not be confirmed due to current cardiac rhythm.    LOS: 3 days   Time spent: More than 50% of that time was spent in counseling  and/or coordination of care.  Alma Friendly, MD [facility]  08/06/2019,  5:19 PM

## 2019-08-07 DIAGNOSIS — G8929 Other chronic pain: Secondary | ICD-10-CM

## 2019-08-07 LAB — BASIC METABOLIC PANEL
Anion gap: 9 (ref 5–15)
BUN: 11 mg/dL (ref 6–20)
CO2: 27 mmol/L (ref 22–32)
Calcium: 8.3 mg/dL — ABNORMAL LOW (ref 8.9–10.3)
Chloride: 103 mmol/L (ref 98–111)
Creatinine, Ser: 0.69 mg/dL (ref 0.44–1.00)
GFR calc Af Amer: >60 mL/min (ref >60)
GFR calc non Af Amer: >60 mL/min (ref >60)
Glucose, Bld: 91 mg/dL (ref 70–99)
Potassium: 4.2 mmol/L (ref 3.5–5.1)
Sodium: 139 mmol/L (ref 135–145)

## 2019-08-07 LAB — CBC
HCT: 32.4 % — ABNORMAL LOW (ref 36.0–46.0)
Hemoglobin: 9.8 g/dL — ABNORMAL LOW (ref 12.0–15.0)
MCH: 23.9 pg — ABNORMAL LOW (ref 26.0–34.0)
MCHC: 30.2 g/dL (ref 30.0–36.0)
MCV: 79 fL — ABNORMAL LOW (ref 80.0–100.0)
Platelets: 255 10*3/uL (ref 150–400)
RBC: 4.1 MIL/uL (ref 3.87–5.11)
RDW: 17.3 % — ABNORMAL HIGH (ref 11.5–15.5)
WBC: 11.7 10*3/uL — ABNORMAL HIGH (ref 4.0–10.5)
nRBC: 0 % (ref 0.0–0.2)

## 2019-08-07 MED ORDER — LIDOCAINE 5 % EX PTCH
1.0000 | MEDICATED_PATCH | CUTANEOUS | Status: DC
  Administered 2019-08-07 – 2019-08-09 (×3): 1 via TRANSDERMAL
  Filled 2019-08-07 (×3): qty 1

## 2019-08-07 MED ORDER — GABAPENTIN 100 MG PO CAPS
200.0000 mg | ORAL_CAPSULE | Freq: Three times a day (TID) | ORAL | Status: DC
  Administered 2019-08-07 – 2019-08-09 (×7): 200 mg via ORAL
  Filled 2019-08-07 (×7): qty 2

## 2019-08-07 MED ORDER — MUPIROCIN 2 % EX OINT
TOPICAL_OINTMENT | Freq: Two times a day (BID) | CUTANEOUS | Status: DC
  Administered 2019-08-08: 1 via NASAL
  Filled 2019-08-07 (×2): qty 22

## 2019-08-07 MED ORDER — CYCLOBENZAPRINE HCL 10 MG PO TABS
10.0000 mg | ORAL_TABLET | Freq: Three times a day (TID) | ORAL | Status: DC | PRN
  Administered 2019-08-07 – 2019-08-09 (×5): 10 mg via ORAL
  Filled 2019-08-07 (×5): qty 1

## 2019-08-07 MED ORDER — METHADONE HCL 5 MG PO TABS
10.0000 mg | ORAL_TABLET | Freq: Three times a day (TID) | ORAL | Status: DC
  Administered 2019-08-07 – 2019-08-09 (×7): 10 mg via ORAL
  Filled 2019-08-07 (×7): qty 2

## 2019-08-07 NOTE — Progress Notes (Signed)
Patient ID: Latoya [name], female   DOB: 1985-05-23, 34 y.o.   MRN: [id number]         [facility]  Date of Admission:  08/03/2019   Total days of antibiotics 9         ASSESSMENT: She has acute on chronic pain due to MRSA lumbar infection.  I told her again that she probably will start to see some improvement over the next week.  Ideally she will agree to at least 6 weeks total IV antibiotic therapy in a supervised setting  PLAN: 1. Continue vancomycin through 09/14/2019 2. Please call me for any infectious disease questions this weekend  Principal Problem:   Infection of lumbar spine (HCC) Active Problems:   MRSA (methicillin resistant staph aureus) culture positive   HCV antibody positive   IV drug abuse (HCC)   Microcytic hypochromic anemia   Scheduled Meds: . buprenorphine-naloxone  1 tablet Sublingual BID  . Chlorhexidine Gluconate Cloth  6 each Topical Daily  . enoxaparin (LOVENOX) injection  40 mg Subcutaneous Q24H  . ferrous sulfate  325 mg Oral Q breakfast  . mupirocin ointment   Nasal BID  . sodium chloride flush  10-40 mL Intracatheter Q12H  . sodium chloride flush  3 mL Intravenous Once  . sodium chloride flush  3 mL Intravenous Q12H   Continuous Infusions: . vancomycin 1,250 mg (08/07/19 1037)   PRN Meds:.acetaminophen **OR** acetaminophen, albuterol, ALPRAZolam, ibuprofen, ketorolac, ondansetron **OR** ondansetron (ZOFRAN) IV, sodium chloride flush   SUBJECTIVE: Latoya [name]'s back pain is largely unchanged.  Review of Systems: Review of Systems  Constitutional: Negative for fever.  Gastrointestinal: Negative for abdominal pain, diarrhea, nausea and vomiting.  Musculoskeletal: Positive for back pain.    Allergies  Allergen Reactions  . Naltrexone Anxiety    Severe anxiety, restlessness, vomiting    OBJECTIVE: Vitals:   08/06/19 1947 08/06/19 2100 08/07/19 0452 08/07/19 0753  BP: (!) 92/57  111/80 99/66  Pulse: 99  93 86   Resp:    19  Temp: 98.7 F (37.1 C)  99 F (37.2 C) 98.8 F (37.1 C)  TempSrc: Oral  Oral   SpO2: 100% 100% 95% 98%  Weight:      Height:       Body mass index is 26.63 kg/m.  Physical Exam Constitutional:      Comments: She is resting quietly in bed.  She is tearful.  Cardiovascular:     Rate and Rhythm: Normal rate and regular rhythm.     Heart sounds: No murmur.  Pulmonary:     Effort: Pulmonary effort is normal.     Breath sounds: Normal breath sounds.     Lab Results Lab Results  Component Value Date   WBC 11.7 (H) 08/07/2019   HGB 9.8 (L) 08/07/2019   HCT 32.4 (L) 08/07/2019   MCV 79.0 (L) 08/07/2019   PLT 255 08/07/2019    Lab Results  Component Value Date   CREATININE 0.69 08/07/2019   BUN 11 08/07/2019   NA 139 08/07/2019   K 4.2 08/07/2019   CL 103 08/07/2019   CO2 27 08/07/2019    Lab Results  Component Value Date   ALT 16 08/03/2019   AST 20 08/03/2019   ALKPHOS 65 08/03/2019   BILITOT 0.5 08/03/2019     Microbiology: Recent Results (from the past 240 hour(s))  Blood culture (routine x 2)     Status: None   Collection Time: 07/28/19  7:30 PM  Specimen: BLOOD RIGHT ARM  Result Value Ref Range Status   Specimen Description BLOOD RIGHT ARM  Final   Special Requests   Final    BOTTLES DRAWN AEROBIC AND ANAEROBIC Blood Culture adequate volume   Culture   Final    NO GROWTH 5 DAYS Performed at [facility], [address], [place], [place] [address]    Report Status 08/02/2019 FINAL  Final  Blood culture (routine x 2)     Status: Abnormal   Collection Time: 07/28/19  7:30 PM   Specimen: BLOOD LEFT ARM  Result Value Ref Range Status   Specimen Description BLOOD LEFT ARM  Final   Special Requests   Final    BOTTLES DRAWN AEROBIC AND ANAEROBIC Blood Culture adequate volume   Culture  Setup Time   Final    GRAM POSITIVE COCCI IN CHAINS ANAEROBIC BOTTLE ONLY CRITICAL RESULT CALLED TO, READ BACK BY AND VERIFIED WITH: PHARMD MINH P.  [phone] [id number]    Culture (A)  Final    VIRIDANS STREPTOCOCCUS THE SIGNIFICANCE OF ISOLATING THIS ORGANISM FROM A SINGLE SET OF BLOOD CULTURES WHEN MULTIPLE SETS ARE DRAWN IS UNCERTAIN. PLEASE NOTIFY THE MICROBIOLOGY DEPARTMENT WITHIN ONE WEEK IF SPECIATION AND SENSITIVITIES ARE REQUIRED. Performed at [facility], [address] [place], [place] [address]    Report Status 07/31/2019 FINAL  Final  SARS CORONAVIRUS 2 (TAT 6-24 HRS) Nasopharyngeal Nasopharyngeal Swab     Status: None   Collection Time: 07/29/19 12:00 AM   Specimen: Nasopharyngeal Swab  Result Value Ref Range Status   SARS Coronavirus 2 NEGATIVE NEGATIVE Final    Comment: (NOTE) SARS-CoV-2 target nucleic acids are NOT DETECTED. The SARS-CoV-2 RNA is generally detectable in upper and lower respiratory specimens during the acute phase of infection. Negative results do not preclude SARS-CoV-2 infection, do not rule out co-infections with other pathogens, and should not be used as the sole basis for treatment or other patient management decisions. Negative results must be combined with clinical observations, patient history, and epidemiological information. The expected result is Negative. Fact Sheet for Patients: HairSlick.no Fact Sheet for Healthcare Providers: quierodirigir.com This test is not yet approved or cleared by the [place] FDA and  has been authorized for detection and/or diagnosis of SARS-CoV-2 by FDA under an Emergency Use Authorization (EUA). This EUA will remain  in effect (meaning this test can be used) for the duration of the COVID-19 declaration under Section 56 4(b)(1) of the Act, 21 U.S.C. section 360bbb-3(b)(1), unless the authorization is terminated or revoked sooner. Performed at [facility], [address], [place], [place] [address]   Aerobic/Anaerobic Culture (surgical/deep wound)     Status: None   Collection Time:  07/29/19 12:54 PM   Specimen: Synovium; Synovial Fluid  Result Value Ref Range Status   Specimen Description SYNOVIAL FLUID  Final   Special Requests RIGHT L4 L5 FACET JOINT  Final   Gram Stain   Final    RARE WBC PRESENT,BOTH PMN AND MONONUCLEAR NO ORGANISMS SEEN    Culture   Final    RARE METHICILLIN RESISTANT STAPHYLOCOCCUS AUREUS NO ANAEROBES ISOLATED CRITICAL RESULT CALLED TO, READ BACK BY AND VERIFIED WITH: RN Elmyra Ricks [phone] FCP Performed at [facility], [address] [place], [place] [address]    Report Status 08/03/2019 FINAL  Final   Organism ID, Bacteria METHICILLIN RESISTANT STAPHYLOCOCCUS AUREUS  Final      Susceptibility   Methicillin resistant  staphylococcus aureus - MIC*    CIPROFLOXACIN >=8 RESISTANT Resistant     ERYTHROMYCIN >=8 RESISTANT Resistant     GENTAMICIN <=0.5 SENSITIVE Sensitive     OXACILLIN >=4 RESISTANT Resistant     TETRACYCLINE <=1 SENSITIVE Sensitive     VANCOMYCIN 1 SENSITIVE Sensitive     TRIMETH/SULFA <=10 SENSITIVE Sensitive     CLINDAMYCIN >=8 RESISTANT Resistant     RIFAMPIN <=0.5 SENSITIVE Sensitive     Inducible Clindamycin NEGATIVE Sensitive     * RARE METHICILLIN RESISTANT STAPHYLOCOCCUS AUREUS  Body fluid culture     Status: None   Collection Time: 07/29/19 12:54 PM   Specimen: Synovium; Synovial Fluid  Result Value Ref Range Status   Specimen Description SYNOVIAL FLUID  Final   Special Requests LEFT L4 L5 FACET JOINT  Final   Gram Stain   Final    FEW WBC PRESENT, PREDOMINANTLY PMN NO ORGANISMS SEEN    Culture   Final    NO GROWTH 3 DAYS Performed at [facility], [address] [place], [place] [address]    Report Status 08/01/2019 FINAL  Final  Blood culture (routine x 2)     Status: None (Preliminary result)   Collection Time: 08/03/19  4:59 AM   Specimen: BLOOD  Result Value Ref Range Status   Specimen Description BLOOD LEFT ANTECUBITAL  Final   Special Requests   Final    BOTTLES DRAWN AEROBIC  AND ANAEROBIC Blood Culture results may not be optimal due to an inadequate volume of blood received in culture bottles   Culture   Final    NO GROWTH 3 DAYS Performed at [facility], [place] [address] [place], [place] [address]    Report Status PENDING  Incomplete  SARS CORONAVIRUS 2 (TAT 6-24 HRS) Nasopharyngeal Nasopharyngeal Swab     Status: None   Collection Time: 08/03/19  7:50 AM   Specimen: Nasopharyngeal Swab  Result Value Ref Range Status   SARS Coronavirus 2 NEGATIVE NEGATIVE Final    Comment: (NOTE) SARS-CoV-2 target nucleic acids are NOT DETECTED. The SARS-CoV-2 RNA is generally detectable in upper and lower respiratory specimens during the acute phase of infection. Negative results do not preclude SARS-CoV-2 infection, do not rule out co-infections with other pathogens, and should not be used as the sole basis for treatment or other patient management decisions. Negative results must be combined with clinical observations, patient history, and epidemiological information. The expected result is Negative. Fact Sheet for Patients: SugarRoll.be Fact Sheet for Healthcare Providers: https://www.woods-mathews.com/ This test is not yet approved or cleared by the [place] FDA and  has been authorized for detection and/or diagnosis of SARS-CoV-2 by FDA under an Emergency Use Authorization (EUA). This EUA will remain  in effect (meaning this test can be used) for the duration of the COVID-19 declaration under Section 56 4(b)(1) of the Act, 21 U.S.C. section 360bbb-3(b)(1), unless the authorization is terminated or revoked sooner. Performed at [facility], [place] [address] [place], [place] [address]   Blood culture (routine x 2)     Status: None (Preliminary result)   Collection Time: 08/03/19  9:08 AM   Specimen: BLOOD  Result Value Ref Range Status   Specimen Description BLOOD SITE NOT SPECIFIED  Final   Special  Requests   Final    BOTTLES DRAWN AEROBIC ONLY Blood Culture results may not be optimal due to an inadequate volume of blood received in culture bottles   Culture  Final    NO GROWTH 3 DAYS Performed at [facility], [address], [place], [place] [address]    Report Status PENDING  Incomplete  MRSA PCR Screening     Status: Abnormal   Collection Time: 08/06/19 12:15 PM   Specimen: Nasal Mucosa; Nasopharyngeal  Result Value Ref Range Status   MRSA by PCR POSITIVE (A) NEGATIVE Final    Comment:        The GeneXpert MRSA Assay (FDA approved for NASAL specimens only), is one component of a comprehensive MRSA colonization surveillance program. It is not intended to diagnose MRSA infection nor to guide or monitor treatment for MRSA infections. RESULT CALLED TO, READ BACK BY AND VERIFIED WITH: Vernie Ammons LPN [id number] 0/1/75 ([email or web address]) Performed at [facility], [address], [place], [place] [address]     Cliffton Asters, MD [facility] Infect[facility] Disease [facility] [phone] pager   [phone] cell 08/07/2019, 1:42 PM

## 2019-08-07 NOTE — Progress Notes (Signed)
PROGRESS NOTE    Latoya [name]  BWL:[id number] DOB: 1986/03/28 DOA: 08/03/2019 PCP: Patient, No Pcp Per   Brief Narrative: 34 y.o. female with medical history significant of IV heroin abuse and L4-5 septic arthritis with MRSA presents with complaints of back pain after leaving AMA from the hospital on 3/28. She was admitted on 3/23 for sepsis secondary to L4-5 facet septic arthritis-s/p fluoroscopy guided aspiration and cultures grew MRSA, seen by ID and was planned for 6 weeks of IV vancomycin. She reports she had to leave AMA and apparently admitted using heroin but was having severe lumbar back pain that was sharp and radiating so came to the ER. Patient usually injects herself in the lower extremities. Upon admission into the emergency department patient was noted to be afebrile, tachycardic, and all other vital signs maintained. Blood cultures were again obtained. Patient admitted for further management  Subjective: Patient seen and examined at bedside.  Denies any new complaints.  Got a call from RN stating patient is crying in pain, requesting methadone, refusing Suboxone.  No record of patient taking methadone at home on our med rec.  Patient has a history of QTC prolongation.   Assessment & Plan:  Recently diagnosed L4-5 facet joint MRSA infection in the setting of IV drug abuse  Patient left AMA and readmitted, seen by ID, continue IV vancomycin   ID and pharmacy will follow and dose vancomycin Follow repeat blood cultures.  IV heroin abuse/substance abuse Patient placed back on Suboxone Continue counseling on cessation of heroin  Microcytic hypochromic anemia/iron deficiency anemia Chronic, hemoglobin is stable Continue oral iron supplementation  Hx of Hepatitis C  LFTs stable  Nutrition: Diet Order            Diet regular Room service appropriate? Yes; Fluid consistency: Thin  Diet effective now              DVT prophylaxis:lovenox Code Status:FULL  Family  Communication: plan of care discussed with patient at bedside. Disposition Plan: Patient is from:Home Anticipated Disposition:TBD Barriers to discharge or conditions that needs to be met prior to discharge: Patient readmitted with facet joint MRSA infection, remains on IV antibiotics disposition difficult due to need for prolonged IV antibiotics, tendency to leave [place], social worker to follow once disposition decided.  Consultants: Infectious disease.    Medications: Scheduled Meds: . buprenorphine-naloxone  1 tablet Sublingual BID  . Chlorhexidine Gluconate Cloth  6 each Topical Daily  . enoxaparin (LOVENOX) injection  40 mg Subcutaneous Q24H  . ferrous sulfate  325 mg Oral Q breakfast  . gabapentin  200 mg Oral TID  . lidocaine  1 patch Transdermal Q24H  . mupirocin ointment   Nasal BID  . sodium chloride flush  10-40 mL Intracatheter Q12H  . sodium chloride flush  3 mL Intravenous Once  . sodium chloride flush  3 mL Intravenous Q12H   Continuous Infusions: . vancomycin 1,250 mg (08/07/19 1037)    Antimicrobials: Anti-infectives (From admission, onward)   Start     Dose/Rate Route Frequency Ordered Stop   08/03/19 0630  vancomycin (VANCOREADY) IVPB 1250 mg/250 mL     1,250 mg 166.7 mL/hr over 90 Minutes Intravenous Every 12 hours 08/03/19 0622         Objective: Vitals: Today's Vitals   08/07/19 0452 08/07/19 0753 08/07/19 1041 08/07/19 1133  BP: 111/80 99/66    Pulse: 93 86    Resp:  19    Temp: 99 F (37.2 C)  98.8 F (37.1 C)    TempSrc: Oral     SpO2: 95% 98%    Weight:      Height:      PainSc: 6   7  Asleep    Intake/Output Summary (Last 24 hours) at 08/07/2019 1433 Last data filed at 08/07/2019 1037 Gross per 24 hour  Intake 360 ml  Output --  Net 360 ml   Filed Weights   08/03/19 0450 08/03/19 0624  Weight: 74.8 kg 74.8 kg   Weight change:    Intake/Output from previous day: No intake/output data recorded. Intake/Output this  shift: Total I/O In: 360 [P.O.:360] Out: -   Examination:  General: NAD   Cardiovascular: S1, S2 present  Respiratory: CTAB  Abdomen: Soft, nontender, nondistended, bowel sounds present  Musculoskeletal: No bilateral pedal edema noted  Skin: Normal  Psychiatry: Normal mood   Data Reviewed: I have personally reviewed following labs and imaging studies CBC: Recent Labs  Lab 08/03/19 0458 08/05/19 0311 08/07/19 0427  WBC 7.3 6.6 11.7*  HGB 11.0* 9.6* 9.8*  HCT 36.7 31.4* 32.4*  MCV 79.3* 77.9* 79.0*  PLT 330 242 098   Basic Metabolic Panel: Recent Labs  Lab 08/02/19 0810 08/03/19 0458 08/04/19 0808 08/05/19 0311 08/07/19 0427  NA 140 139 140 142 139  K 3.6 3.3* 4.2 3.9 4.2  CL 108 103 104 107 103  CO2 _0 GLUCOSE 93 99 93 98 91  BUN _1 CREATININE 0.55 0.60 0.56 0.47 0.69  CALCIUM 8.6* 9.1 8.8* 8.7* 8.3*   GFR: Estimated Creatinine Clearance: 104.4 mL/min (by C-G formula based on SCr of 0.69 mg/dL). Liver Function Tests: Recent Labs  Lab 08/03/19 0458  AST 20  ALT 16  ALKPHOS 65  BILITOT 0.5  PROT 7.7  ALBUMIN 3.5   No results for input(s): LIPASE, AMYLASE in the last 168 hours. No results for input(s): AMMONIA in the last 168 hours. Coagulation Profile: No results for input(s): INR, PROTIME in the last 168 hours. Cardiac Enzymes: No results for input(s): CKTOTAL, CKMB, CKMBINDEX, TROPONINI in the last 168 hours. BNP (last 3 results) No results for input(s): PROBNP in the last 8760 hours. HbA1C: No results for input(s): HGBA1C in the last 72 hours. CBG: No results for input(s): GLUCAP in the last 168 hours. Lipid Profile: No results for input(s): CHOL, HDL, LDLCALC, TRIG, CHOLHDL, LDLDIRECT in the last 72 hours. Thyroid Function Tests: No results for input(s): TSH, T4TOTAL, FREET4, T3FREE, THYROIDAB in the last 72 hours. Anemia Panel: No results for input(s): VITAMINB12, FOLATE, FERRITIN, TIBC, IRON, RETICCTPCT in the  last 72 hours. Sepsis Labs: Recent Labs  Lab 08/03/19 0458  LATICACIDVEN 1.1    Recent Results (from the past 240 hour(s))  Blood culture (routine x 2)     Status: None   Collection Time: 07/28/19  7:30 PM   Specimen: BLOOD RIGHT ARM  Result Value Ref Range Status   Specimen Description BLOOD RIGHT ARM  Final   Special Requests   Final    BOTTLES DRAWN AEROBIC AND ANAEROBIC Blood Culture adequate volume   Culture   Final    NO GROWTH 5 DAYS Performed at [facility], [address], [place], [place] [address]    Report Status 08/02/2019 FINAL  Final  Blood culture (routine x 2)     Status: Abnormal   Collection Time: 07/28/19  7:30 PM   Specimen: BLOOD LEFT ARM  Result  Value Ref Range Status   Specimen Description BLOOD LEFT ARM  Final   Special Requests   Final    BOTTLES DRAWN AEROBIC AND ANAEROBIC Blood Culture adequate volume   Culture  Setup Time   Final    GRAM POSITIVE COCCI IN CHAINS ANAEROBIC BOTTLE ONLY CRITICAL RESULT CALLED TO, READ BACK BY AND VERIFIED WITH: PHARMD Meno P. [phone]    Culture (A)  Final    VIRIDANS STREPTOCOCCUS THE SIGNIFICANCE OF ISOLATING THIS ORGANISM FROM A SINGLE SET OF BLOOD CULTURES WHEN MULTIPLE SETS ARE DRAWN IS UNCERTAIN. PLEASE NOTIFY THE MICROBIOLOGY DEPARTMENT WITHIN ONE WEEK IF SPECIATION AND SENSITIVITIES ARE REQUIRED. Performed at [facility], [place] [address] [place], [place] [address]    Report Status 07/31/2019 FINAL  Final  SARS CORONAVIRUS 2 (TAT 6-24 HRS) Nasopharyngeal Nasopharyngeal Swab     Status: None   Collection Time: 07/29/19 12:00 AM   Specimen: Nasopharyngeal Swab  Result Value Ref Range Status   SARS Coronavirus 2 NEGATIVE NEGATIVE Final    Comment: (NOTE) SARS-CoV-2 target nucleic acids are NOT DETECTED. The SARS-CoV-2 RNA is generally detectable in upper and lower respiratory specimens during the acute phase of infection. Negative results do not preclude SARS-CoV-2 infection, do not rule  out co-infections with other pathogens, and should not be used as the sole basis for treatment or other patient management decisions. Negative results must be combined with clinical observations, patient history, and epidemiological information. The expected result is Negative. Fact Sheet for Patients: SugarRoll.be Fact Sheet for Healthcare Providers: https://www.woods-mathews.com/ This test is not yet approved or cleared by the [place] FDA and  has been authorized for detection and/or diagnosis of SARS-CoV-2 by FDA under an Emergency Use Authorization (EUA). This EUA will remain  in effect (meaning this test can be used) for the duration of the COVID-19 declaration under Section 56 4(b)(1) of the Act, 21 U.S.C. section 360bbb-3(b)(1), unless the authorization is terminated or revoked sooner. Performed at [facility], [place] [address], [place], [place] [address]   Aerobic/Anaerobic Culture (surgical/deep wound)     Status: None   Collection Time: 07/29/19 12:54 PM   Specimen: Synovium; Synovial Fluid  Result Value Ref Range Status   Specimen Description SYNOVIAL FLUID  Final   Special Requests RIGHT L4 L5 FACET JOINT  Final   Gram Stain   Final    RARE WBC PRESENT,BOTH PMN AND MONONUCLEAR NO ORGANISMS SEEN    Culture   Final    RARE METHICILLIN RESISTANT STAPHYLOCOCCUS AUREUS NO ANAEROBES ISOLATED CRITICAL RESULT CALLED TO, READ BACK BY AND VERIFIED WITH: RN Julianne Rice [phone] FCP Performed at [facility], [place] [address], [place], [place] [address]    Report Status 08/03/2019 FINAL  Final   Organism ID, Bacteria METHICILLIN RESISTANT STAPHYLOCOCCUS AUREUS  Final      Susceptibility   Methicillin resistant staphylococcus aureus - MIC*    CIPROFLOXACIN >=8 RESISTANT Resistant     ERYTHROMYCIN >=8 RESISTANT Resistant     GENTAMICIN <=0.5 SENSITIVE Sensitive     OXACILLIN >=4 RESISTANT Resistant     TETRACYCLINE <=1  SENSITIVE Sensitive     VANCOMYCIN 1 SENSITIVE Sensitive     TRIMETH/SULFA <=10 SENSITIVE Sensitive     CLINDAMYCIN >=8 RESISTANT Resistant     RIFAMPIN <=0.5 SENSITIVE Sensitive     Inducible Clindamycin NEGATIVE Sensitive     * RARE METHICILLIN RESISTANT STAPHYLOCOCCUS AUREUS  Body fluid culture  Status: None   Collection Time: 07/29/19 12:54 PM   Specimen: Synovium; Synovial Fluid  Result Value Ref Range Status   Specimen Description SYNOVIAL FLUID  Final   Special Requests LEFT L4 L5 FACET JOINT  Final   Gram Stain   Final    FEW WBC PRESENT, PREDOMINANTLY PMN NO ORGANISMS SEEN    Culture   Final    NO GROWTH 3 DAYS Performed at [facility], [address], [place], [place] [address]    Report Status 08/01/2019 FINAL  Final  Blood culture (routine x 2)     Status: None (Preliminary result)   Collection Time: 08/03/19  4:59 AM   Specimen: BLOOD  Result Value Ref Range Status   Specimen Description BLOOD LEFT ANTECUBITAL  Final   Special Requests   Final    BOTTLES DRAWN AEROBIC AND ANAEROBIC Blood Culture results may not be optimal due to an inadequate volume of blood received in culture bottles   Culture   Final    NO GROWTH 4 DAYS Performed at [facility], [place] [address], [place], [place] [address]    Report Status PENDING  Incomplete  SARS CORONAVIRUS 2 (TAT 6-24 HRS) Nasopharyngeal Nasopharyngeal Swab     Status: None   Collection Time: 08/03/19  7:50 AM   Specimen: Nasopharyngeal Swab  Result Value Ref Range Status   SARS Coronavirus 2 NEGATIVE NEGATIVE Final    Comment: (NOTE) SARS-CoV-2 target nucleic acids are NOT DETECTED. The SARS-CoV-2 RNA is generally detectable in upper and lower respiratory specimens during the acute phase of infection. Negative results do not preclude SARS-CoV-2 infection, do not rule out co-infections with other pathogens, and should not be used as the sole basis for treatment or other patient management  decisions. Negative results must be combined with clinical observations, patient history, and epidemiological information. The expected result is Negative. Fact Sheet for Patients: SugarRoll.be Fact Sheet for Healthcare Providers: https://www.woods-mathews.com/ This test is not yet approved or cleared by the [place] FDA and  has been authorized for detection and/or diagnosis of SARS-CoV-2 by FDA under an Emergency Use Authorization (EUA). This EUA will remain  in effect (meaning this test can be used) for the duration of the COVID-19 declaration under Section 56 4(b)(1) of the Act, 21 U.S.C. section 360bbb-3(b)(1), unless the authorization is terminated or revoked sooner. Performed at [facility], [place] [address], [place], [place] [address]   Blood culture (routine x 2)     Status: None (Preliminary result)   Collection Time: 08/03/19  9:08 AM   Specimen: BLOOD  Result Value Ref Range Status   Specimen Description BLOOD SITE NOT SPECIFIED  Final   Special Requests   Final    BOTTLES DRAWN AEROBIC ONLY Blood Culture results may not be optimal due to an inadequate volume of blood received in culture bottles   Culture   Final    NO GROWTH 4 DAYS Performed at [facility], [place] [address], [place], [place] [address]    Report Status PENDING  Incomplete  MRSA PCR Screening     Status: Abnormal   Collection Time: 08/06/19 12:15 PM   Specimen: Nasal Mucosa; Nasopharyngeal  Result Value Ref Range Status   MRSA by PCR POSITIVE (A) NEGATIVE Final    Comment:        The GeneXpert MRSA Assay (FDA approved for NASAL specimens only), is one component of a comprehensive MRSA colonization surveillance program. It is  not intended to diagnose MRSA infection nor to guide or monitor treatment for MRSA infections. RESULT CALLED TO, READ BACK BY AND VERIFIED WITH: Torrie Mayers LPN 15:17 10/06/58 ([email or web address]) Performed at [facility], [place] [address], [place], [place] [address]       Radiology Studies: [place] EKG SITE RITE  Result Date: 08/06/2019 If Site Rite image not attached, placement could not be confirmed due to current cardiac rhythm.    LOS: 4 days   Time spent: More than 50% of that time was spent in counseling and/or coordination of care.  Alma Friendly, MD [facility]  08/07/2019, 2:33 PM

## 2019-08-08 ENCOUNTER — Inpatient Hospital Stay (HOSPITAL_COMMUNITY): Payer: Medicaid Other

## 2019-08-08 LAB — CULTURE, BLOOD (ROUTINE X 2): Culture: NO GROWTH

## 2019-08-08 IMAGING — DX DG CHEST 1V PORT
1 series · 1 of 1 positions shown · non-contrast
Comparison: [DATE]

CLINICAL DATA: Shortness of breath

EXAM:
PORTABLE CHEST 1 VIEW

[chest ap]
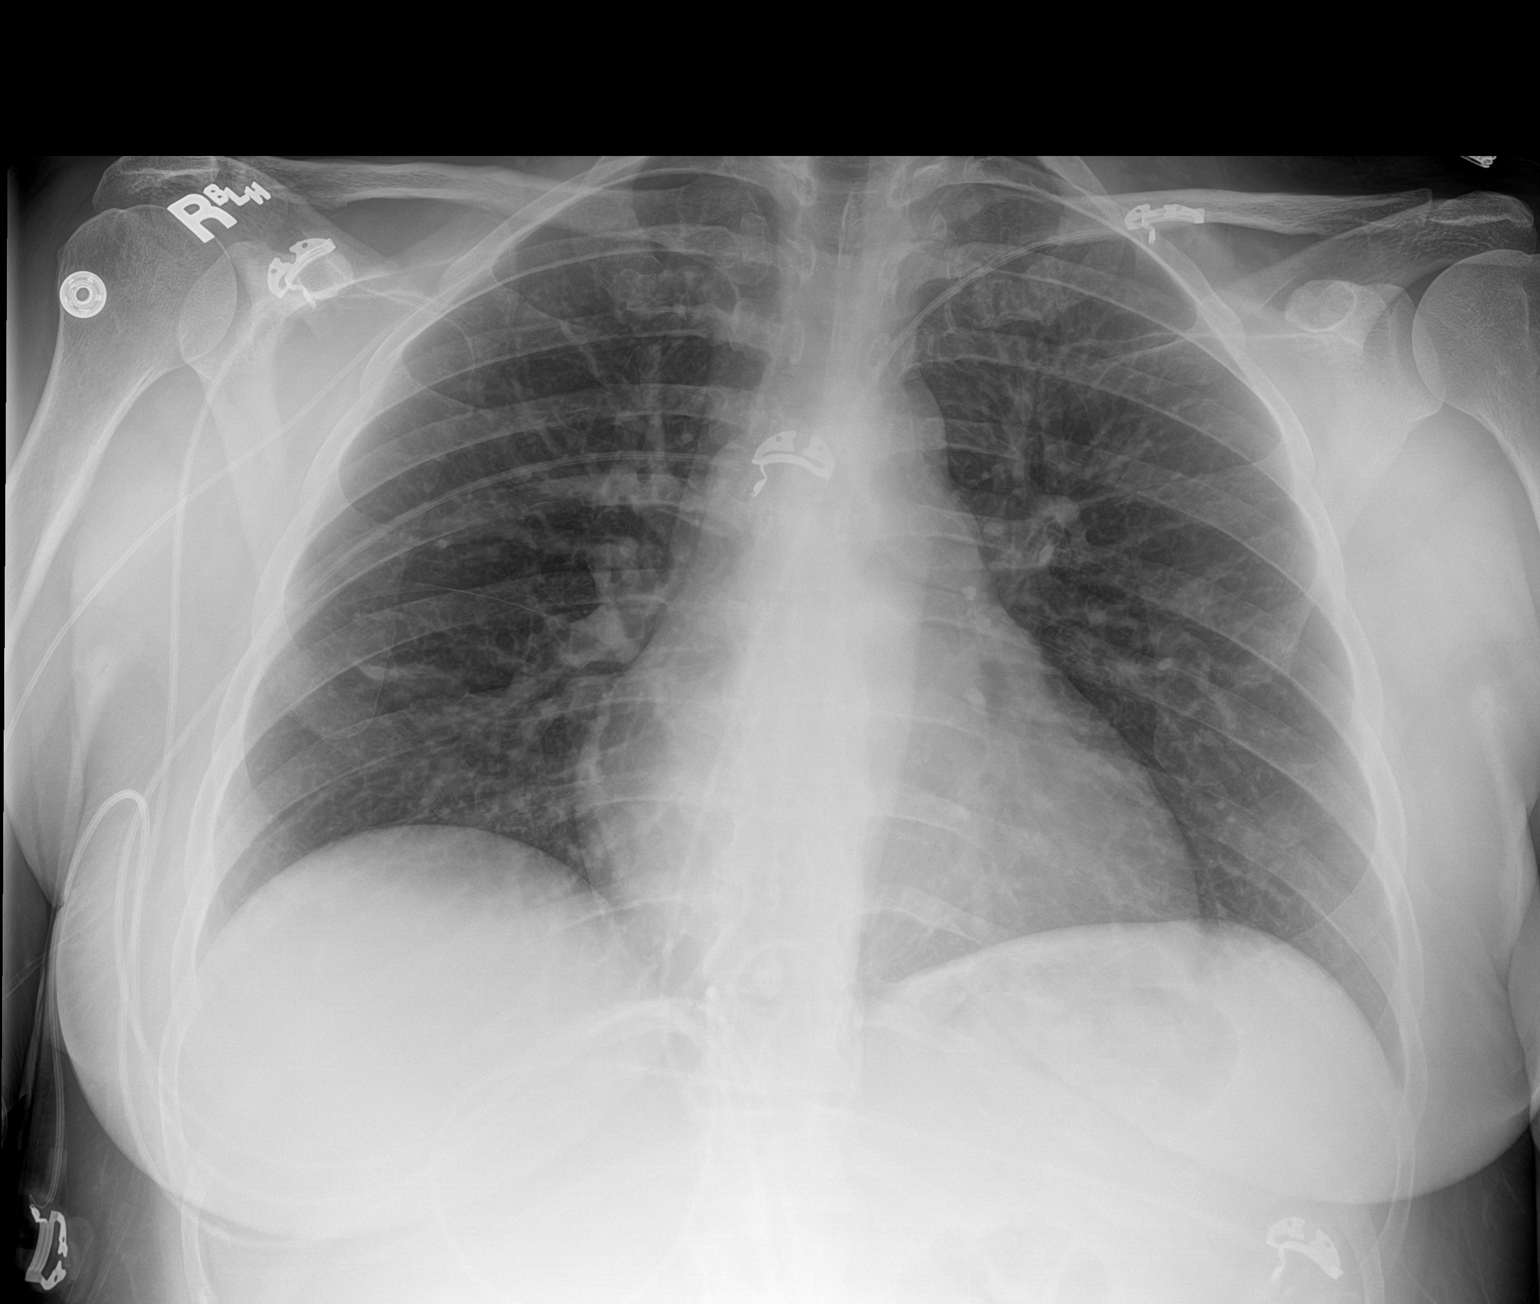

[1 of 1 positions shown; findings below may reference images not displayed]

FINDINGS: The heart size and mediastinal contours are within normal limits.
Right upper extremity PICC, tip positioned over the right subclavian
vessels. Both lungs are clear. The visualized skeletal structures
are unremarkable.
IMPRESSION: 1.  No acute abnormality of the lungs.

2. Right upper extremity PICC, tip positioned over the right
subclavian vessels.

## 2019-08-08 NOTE — Progress Notes (Signed)
PROGRESS NOTE    Latoya [name]  XBM:[id number] DOB: [birth year] DOA: 08/03/2019 PCP: Patient, No Pcp Per   Brief Narrative: 34 y.o. female with medical history significant of IV heroin abuse and L4-5 septic arthritis with MRSA presents with complaints of back pain after leaving AMA from the hospital on 3/28. She was admitted on 3/23 for sepsis secondary to L4-5 facet septic arthritis-s/p fluoroscopy guided aspiration and cultures grew MRSA, seen by ID and was planned for 6 weeks of IV vancomycin. She reports she had to leave AMA and apparently admitted using heroin but was having severe lumbar back pain that was sharp and radiating so came to the ER. Patient usually injects herself in the lower extremities. Upon admission into the emergency department patient was noted to be afebrile, tachycardic, and all other vital signs maintained. Blood cultures were again obtained. Patient admitted for further management  Subjective: Patient seen and examined at bedside.  Denies any new complaints.  Patient refused taking Suboxone stating it does not help requesting methadone.  Discussed with patient about the side effects of methadone, discussed with pharmacy regarding dosage.  Patient states that she would like to follow-up in methadone/pain clinic upon discharge   Assessment & Plan:  Recently diagnosed L4-5 facet joint MRSA infection in the setting of IV drug abuse Currently afebrile with no leukocytosis Repeat blood cultures NGTD ID on board, continue with IV vancomycin Pharmacy will follow and dose vancomycin  IV heroin abuse/substance abuse Patient placed back on Suboxone, currently refusing, stating it does not help requested methadone as she has previously taken it during an admission and it helped better Discussed with patient about the side effects of methadone, discussed with pharmacy regarding dosage Patient states that she would like to follow-up in methadone/pain clinic upon  discharge Continue counseling on cessation of heroin  Microcytic hypochromic anemia/iron deficiency anemia Chronic, hemoglobin is stable Continue oral iron supplementation  Hx of Hepatitis C  LFTs stable  Nutrition: Diet Order            Diet regular Room service appropriate? Yes; Fluid consistency: Thin  Diet effective now               DVT prophylaxis:lovenox Code Status:FULL  Family Communication: plan of care discussed with patient at bedside. Disposition Plan: Patient is from:Home Anticipated Disposition:Pending ID Barriers to discharge or conditions that needs to be met prior to discharge: Patient readmitted with facet joint MRSA infection, remains on IV antibiotics disposition difficult due to need for prolonged IV antibiotics, tendency to leave [place], social worker to follow once disposition decided.  Consultants: Infectious disease.    Medications: Scheduled Meds: . Chlorhexidine Gluconate Cloth  6 each Topical Daily  . enoxaparin (LOVENOX) injection  40 mg Subcutaneous Q24H  . ferrous sulfate  325 mg Oral Q breakfast  . gabapentin  200 mg Oral TID  . lidocaine  1 patch Transdermal Q24H  . methadone  10 mg Oral Q8H  . mupirocin ointment   Nasal BID  . sodium chloride flush  10-40 mL Intracatheter Q12H  . sodium chloride flush  3 mL Intravenous Once  . sodium chloride flush  3 mL Intravenous Q12H   Continuous Infusions: . vancomycin 1,250 mg (08/08/19 0956)    Antimicrobials: Anti-infectives (From admission, onward)   Start     Dose/Rate Route Frequency Ordered Stop   08/03/19 0630  vancomycin (VANCOREADY) IVPB 1250 mg/250 mL     1,250 mg 166.7 mL/hr over 90 Minutes Intravenous  Every 12 hours 08/03/19 0622         Objective: Vitals: Today's Vitals   08/07/19 1944 08/07/19 2336 08/08/19 0036 08/08/19 0836  BP:    109/74  Pulse: 100   82  Resp:    17  Temp:    98 F (36.7 C)  TempSrc:      SpO2:    97%  Weight:      Height:       PainSc:  3  Asleep 0-No pain    Intake/Output Summary (Last 24 hours) at 08/08/2019 1503 Last data filed at 08/08/2019 0956 Gross per 24 hour  Intake 1690 ml  Output --  Net 1690 ml   Filed Weights   08/03/19 0450 08/03/19 0624  Weight: 74.8 kg 74.8 kg   Weight change:    Intake/Output from previous day: 04/02 0701 - 04/03 0700 In: 2050 [P.O.:1800; IV Piggyback:250] Out: -  Intake/Output this shift: Total I/O In: 730 [P.O.:480; IV Piggyback:250] Out: -   Examination:  General: NAD   Cardiovascular: S1, S2 present  Respiratory: CTAB  Abdomen: Soft, nontender, nondistended, bowel sounds present  Musculoskeletal: No bilateral pedal edema noted  Skin: Normal  Psychiatry: Normal mood   Data Reviewed: I have personally reviewed following labs and imaging studies CBC: Recent Labs  Lab 08/03/19 0458 08/05/19 0311 08/07/19 0427  WBC 7.3 6.6 11.7*  HGB 11.0* 9.6* 9.8*  HCT 36.7 31.4* 32.4*  MCV 79.3* 77.9* 79.0*  PLT 330 242 670   Basic Metabolic Panel: Recent Labs  Lab 08/02/19 0810 08/03/19 0458 08/04/19 0808 08/05/19 0311 08/07/19 0427  NA 140 139 140 142 139  K 3.6 3.3* 4.2 3.9 4.2  CL 108 103 104 107 103  CO2 '23 29 27 27 27  ' GLUCOSE 93 99 93 98 91  BUN '10 6 9 9 11  ' CREATININE 0.55 0.60 0.56 0.47 0.69  CALCIUM 8.6* 9.1 8.8* 8.7* 8.3*   GFR: Estimated Creatinine Clearance: 104.4 mL/min (by C-G formula based on SCr of 0.69 mg/dL). Liver Function Tests: Recent Labs  Lab 08/03/19 0458  AST 20  ALT 16  ALKPHOS 65  BILITOT 0.5  PROT 7.7  ALBUMIN 3.5   No results for input(s): LIPASE, AMYLASE in the last 168 hours. No results for input(s): AMMONIA in the last 168 hours. Coagulation Profile: No results for input(s): INR, PROTIME in the last 168 hours. Cardiac Enzymes: No results for input(s): CKTOTAL, CKMB, CKMBINDEX, TROPONINI in the last 168 hours. BNP (last 3 results) No results for input(s): PROBNP in the last 8760 hours. HbA1C: No  results for input(s): HGBA1C in the last 72 hours. CBG: No results for input(s): GLUCAP in the last 168 hours. Lipid Profile: No results for input(s): CHOL, HDL, LDLCALC, TRIG, CHOLHDL, LDLDIRECT in the last 72 hours. Thyroid Function Tests: No results for input(s): TSH, T4TOTAL, FREET4, T3FREE, THYROIDAB in the last 72 hours. Anemia Panel: No results for input(s): VITAMINB12, FOLATE, FERRITIN, TIBC, IRON, RETICCTPCT in the last 72 hours. Sepsis Labs: Recent Labs  Lab 08/03/19 0458  LATICACIDVEN 1.1    Recent Results (from the past 240 hour(s))  Blood culture (routine x 2)     Status: None (Preliminary result)   Collection Time: 08/03/19  4:59 AM   Specimen: BLOOD  Result Value Ref Range Status   Specimen Description BLOOD LEFT ANTECUBITAL  Final   Special Requests   Final    BOTTLES DRAWN AEROBIC AND ANAEROBIC Blood Culture results may not be optimal due  to an inadequate volume of blood received in culture bottles   Culture   Final    NO GROWTH 4 DAYS Performed at [facility], [place] [address], [place], [place] [address]    Report Status PENDING  Incomplete  SARS CORONAVIRUS 2 (TAT 6-24 HRS) Nasopharyngeal Nasopharyngeal Swab     Status: None   Collection Time: 08/03/19  7:50 AM   Specimen: Nasopharyngeal Swab  Result Value Ref Range Status   SARS Coronavirus 2 NEGATIVE NEGATIVE Final    Comment: (NOTE) SARS-CoV-2 target nucleic acids are NOT DETECTED. The SARS-CoV-2 RNA is generally detectable in upper and lower respiratory specimens during the acute phase of infection. Negative results do not preclude SARS-CoV-2 infection, do not rule out co-infections with other pathogens, and should not be used as the sole basis for treatment or other patient management decisions. Negative results must be combined with clinical observations, patient history, and epidemiological information. The expected result is Negative. Fact Sheet for  Patients: SugarRoll.be Fact Sheet for Healthcare Providers: https://www.woods-mathews.com/ This test is not yet approved or cleared by the [place] FDA and  has been authorized for detection and/or diagnosis of SARS-CoV-2 by FDA under an Emergency Use Authorization (EUA). This EUA will remain  in effect (meaning this test can be used) for the duration of the COVID-19 declaration under Section 56 4(b)(1) of the Act, 21 U.S.C. section 360bbb-3(b)(1), unless the authorization is terminated or revoked sooner. Performed at [facility], [place] [address], [place], [place] [address]   Blood culture (routine x 2)     Status: None (Preliminary result)   Collection Time: 08/03/19  9:08 AM   Specimen: BLOOD  Result Value Ref Range Status   Specimen Description BLOOD SITE NOT SPECIFIED  Final   Special Requests   Final    BOTTLES DRAWN AEROBIC ONLY Blood Culture results may not be optimal due to an inadequate volume of blood received in culture bottles   Culture   Final    NO GROWTH 5 DAYS Performed at [facility], [place] [address] [place], [place] [address]    Report Status PENDING  Incomplete  MRSA PCR Screening     Status: Abnormal   Collection Time: 08/06/19 12:15 PM   Specimen: Nasal Mucosa; Nasopharyngeal  Result Value Ref Range Status   MRSA by PCR POSITIVE (A) NEGATIVE Final    Comment:        The GeneXpert MRSA Assay (FDA approved for NASAL specimens only), is one component of a comprehensive MRSA colonization surveillance program. It is not intended to diagnose MRSA infection nor to guide or monitor treatment for MRSA infections. RESULT CALLED TO, READ BACK BY AND VERIFIED WITH: Torrie Mayers LPN 08:02 06/09/34 ([email or web address]) Performed at [facility], [place] [address] [place], [place] [address]       Radiology Studies: No results found.   LOS: 5 days    Alma Friendly, MD [facility]  08/08/2019,  3:03 PM

## 2019-08-08 NOTE — Progress Notes (Signed)
After cxr results confirmed, piv placed for vancomycin administration.  Staff will call MD for exchange order.

## 2019-08-08 NOTE — Progress Notes (Signed)
Pharmacy Antibiotic Note  Latoya [name] is a 34 y.o. female admitted on 08/03/2019 with MRSA lumbar infection in the setting of IV drug use (pt had left AMA on 08/02/19). Patient remains on vancomycin - planning to continue through 09/14/19.  WBC up 11, afebrile, Scr 0.69, CrCl >100 ml/min (renal function stable). Vancomycin levels completed 3/31 were therapeutic.  Plan: -Continue vancomycin 1250 mg IV Q 12 hrs (6 weeks planned) -Monitor WBC, temp, clinical improvement, cultures, renal function -Plan on weekly vancomycin levels  Height: 5\' 6"  (167.6 cm) Weight: 74.8 kg (165 lb) IBW/kg (Calculated) : 59.3  Temp (24hrs), Avg:98.4 F (36.9 C), Min:98 F (36.7 C), Max:98.7 F (37.1 C)  Recent Labs  Lab 08/02/19 0810 08/02/19 0814 08/03/19 0458 08/04/19 0808 08/05/19 0311 08/05/19 1221 08/05/19 2106 08/07/19 0427  WBC  --   --  7.3  --  6.6  --   --  11.7*  CREATININE 0.55  --  0.60 0.56 0.47  --   --  0.69  LATICACIDVEN  --   --  1.1  --   --   --   --   --   VANCOTROUGH  --   --   --   --   --   --  9*  --   VANCOPEAK  --  22*  --   --   --  28*  --   --     Estimated Creatinine Clearance: 104.4 mL/min (by C-G formula based on SCr of 0.69 mg/dL).    Allergies  Allergen Reactions  . Naltrexone Anxiety    Severe anxiety, restlessness, vomiting   Microbiology Results: 3/29 COVID: negative 3/29 Bld cx X 2 sets: NGTD 3/24 Synovial fluid (R L4 L5 facet joint): rare MRSA   4/24 08/08/2019 9:26 AM

## 2019-08-09 DIAGNOSIS — R768 Other specified abnormal immunological findings in serum: Secondary | ICD-10-CM

## 2019-08-09 NOTE — Progress Notes (Signed)
Secure chat with Dr Sharolyn Douglas re PICC exchange .  Has PIV working well at this time.  Orders to d/c current dislodged PICC and d/c PICC order per securechat.

## 2019-08-09 NOTE — Progress Notes (Signed)
PROGRESS NOTE    Latoya [name]  TML:[id number] DOB: [birth year] DOA: 08/03/2019 PCP: Patient, No Pcp Per   Brief Narrative: 34 y.o. female with medical history significant of IV heroin abuse and L4-5 septic arthritis with MRSA presents with complaints of back pain after leaving AMA from the hospital on 3/28. She was admitted on 3/23 for sepsis secondary to L4-5 facet septic arthritis-s/p fluoroscopy guided aspiration and cultures grew MRSA, seen by ID and was planned for 6 weeks of IV vancomycin. She reports she had to leave AMA and apparently admitted using heroin but was having severe lumbar back pain that was sharp and radiating so came to the ER. Patient usually injects herself in the lower extremities. Upon admission into the emergency department patient was noted to be afebrile, tachycardic, and all other vital signs maintained. Blood cultures were again obtained. Patient admitted for further management  Subjective: Patient seen and examined at bedside.  Denies any new complaints today.    Assessment & Plan:  Recently diagnosed L4-5 facet joint MRSA infection in the setting of IV drug abuse Currently afebrile with no leukocytosis Repeat blood cultures NGTD ID on board, continue with IV vancomycin Pharmacy will follow and dose vancomycin  IV heroin abuse/substance abuse Patient placed back on Suboxone, currently refusing, stating it does not help requested methadone as she has previously taken it during an admission and it helped better Discussed with patient about the side effects of methadone, discussed with pharmacy regarding dosage Patient states that she would like to follow-up in methadone/pain clinic upon discharge Continue counseling on cessation of heroin  Microcytic hypochromic anemia/iron deficiency anemia Chronic, hemoglobin is stable Continue oral iron supplementation  Hx of Hepatitis C  LFTs stable  Nutrition: Diet Order            Diet regular Room service  appropriate? Yes; Fluid consistency: Thin  Diet effective now               DVT prophylaxis:lovenox Code Status:FULL  Family Communication: plan of care discussed with patient at bedside. Disposition Plan: Patient is from:Home Anticipated Disposition:Pending ID Barriers to discharge or conditions that needs to be met prior to discharge: Patient readmitted with facet joint MRSA infection, remains on IV antibiotics disposition difficult due to need for prolonged IV antibiotics, tendency to leave [place], social worker to follow once disposition decided.  Consultants: Infectious disease.    Medications: Scheduled Meds: . Chlorhexidine Gluconate Cloth  6 each Topical Daily  . enoxaparin (LOVENOX) injection  40 mg Subcutaneous Q24H  . ferrous sulfate  325 mg Oral Q breakfast  . gabapentin  200 mg Oral TID  . lidocaine  1 patch Transdermal Q24H  . methadone  10 mg Oral Q8H  . mupirocin ointment   Nasal BID  . sodium chloride flush  10-40 mL Intracatheter Q12H  . sodium chloride flush  3 mL Intravenous Once  . sodium chloride flush  3 mL Intravenous Q12H   Continuous Infusions: . vancomycin 1,250 mg (08/09/19 0933)    Antimicrobials: Anti-infectives (From admission, onward)   Start     Dose/Rate Route Frequency Ordered Stop   08/03/19 0630  vancomycin (VANCOREADY) IVPB 1250 mg/250 mL     1,250 mg 166.7 mL/hr over 90 Minutes Intravenous Every 12 hours 08/03/19 0622         Objective: Vitals: Today's Vitals   08/09/19 0042 08/09/19 0142 08/09/19 0805 08/09/19 0849  BP:    99/72  Pulse:    75  Resp:    17  Temp:    98 F (36.7 C)  TempSrc:    Oral  SpO2:    100%  Weight:      Height:      PainSc: 5  3  Asleep     Intake/Output Summary (Last 24 hours) at 08/09/2019 1404 Last data filed at 08/09/2019 0600 Gross per 24 hour  Intake 499.55 ml  Output --  Net 499.55 ml   Filed Weights   08/03/19 0450 08/03/19 0624  Weight: 74.8 kg 74.8 kg   Weight  change:    Intake/Output from previous day: 04/03 0701 - 04/04 0700 In: 1229.6 [P.O.:480; IV Piggyback:749.6] Out: -  Intake/Output this shift: No intake/output data recorded.  Examination:  General: NAD   Cardiovascular: S1, S2 present  Respiratory: CTAB  Abdomen: Soft, nontender, nondistended, bowel sounds present  Musculoskeletal: No bilateral pedal edema noted  Skin: Normal  Psychiatry: Normal mood   Data Reviewed: I have personally reviewed following labs and imaging studies CBC: Recent Labs  Lab 08/03/19 0458 08/05/19 0311 08/07/19 0427  WBC 7.3 6.6 11.7*  HGB 11.0* 9.6* 9.8*  HCT 36.7 31.4* 32.4*  MCV 79.3* 77.9* 79.0*  PLT 330 242 945   Basic Metabolic Panel: Recent Labs  Lab 08/03/19 0458 08/04/19 0808 08/05/19 0311 08/07/19 0427  NA 139 140 142 139  K 3.3* 4.2 3.9 4.2  CL 103 104 107 103  CO2 '29 27 27 27  ' GLUCOSE 99 93 98 91  BUN '6 9 9 11  ' CREATININE 0.60 0.56 0.47 0.69  CALCIUM 9.1 8.8* 8.7* 8.3*   GFR: Estimated Creatinine Clearance: 104.4 mL/min (by C-G formula based on SCr of 0.69 mg/dL). Liver Function Tests: Recent Labs  Lab 08/03/19 0458  AST 20  ALT 16  ALKPHOS 65  BILITOT 0.5  PROT 7.7  ALBUMIN 3.5   No results for input(s): LIPASE, AMYLASE in the last 168 hours. No results for input(s): AMMONIA in the last 168 hours. Coagulation Profile: No results for input(s): INR, PROTIME in the last 168 hours. Cardiac Enzymes: No results for input(s): CKTOTAL, CKMB, CKMBINDEX, TROPONINI in the last 168 hours. BNP (last 3 results) No results for input(s): PROBNP in the last 8760 hours. HbA1C: No results for input(s): HGBA1C in the last 72 hours. CBG: No results for input(s): GLUCAP in the last 168 hours. Lipid Profile: No results for input(s): CHOL, HDL, LDLCALC, TRIG, CHOLHDL, LDLDIRECT in the last 72 hours. Thyroid Function Tests: No results for input(s): TSH, T4TOTAL, FREET4, T3FREE, THYROIDAB in the last 72 hours. Anemia  Panel: No results for input(s): VITAMINB12, FOLATE, FERRITIN, TIBC, IRON, RETICCTPCT in the last 72 hours. Sepsis Labs: Recent Labs  Lab 08/03/19 0458  LATICACIDVEN 1.1    Recent Results (from the past 240 hour(s))  Blood culture (routine x 2)     Status: None   Collection Time: 08/03/19  4:59 AM   Specimen: BLOOD  Result Value Ref Range Status   Specimen Description BLOOD LEFT ANTECUBITAL  Final   Special Requests   Final    BOTTLES DRAWN AEROBIC AND ANAEROBIC Blood Culture results may not be optimal due to an inadequate volume of blood received in culture bottles   Culture   Final    NO GROWTH 5 DAYS Performed at [facility], [place] [address] [place], [place] [address]    Report Status 08/08/2019 FINAL  Final  SARS CORONAVIRUS 2 (TAT 6-24 HRS) Nasopharyngeal Nasopharyngeal Swab  Status: None   Collection Time: 08/03/19  7:50 AM   Specimen: Nasopharyngeal Swab  Result Value Ref Range Status   SARS Coronavirus 2 NEGATIVE NEGATIVE Final    Comment: (NOTE) SARS-CoV-2 target nucleic acids are NOT DETECTED. The SARS-CoV-2 RNA is generally detectable in upper and lower respiratory specimens during the acute phase of infection. Negative results do not preclude SARS-CoV-2 infection, do not rule out co-infections with other pathogens, and should not be used as the sole basis for treatment or other patient management decisions. Negative results must be combined with clinical observations, patient history, and epidemiological information. The expected result is Negative. Fact Sheet for Patients: SugarRoll.be Fact Sheet for Healthcare Providers: https://www.woods-mathews.com/ This test is not yet approved or cleared by the [place] FDA and  has been authorized for detection and/or diagnosis of SARS-CoV-2 by FDA under an Emergency Use Authorization (EUA). This EUA will remain  in effect (meaning this test can be used) for the  duration of the COVID-19 declaration under Section 56 4(b)(1) of the Act, 21 U.S.C. section 360bbb-3(b)(1), unless the authorization is terminated or revoked sooner. Performed at [facility], [place] [address], [place], [place] [address]   Blood culture (routine x 2)     Status: None (Preliminary result)   Collection Time: 08/03/19  9:08 AM   Specimen: BLOOD  Result Value Ref Range Status   Specimen Description BLOOD SITE NOT SPECIFIED  Final   Special Requests   Final    BOTTLES DRAWN AEROBIC ONLY Blood Culture results may not be optimal due to an inadequate volume of blood received in culture bottles   Culture   Final    NO GROWTH 5 DAYS Performed at [facility], [place] [address] [place], [place] [address]    Report Status PENDING  Incomplete  MRSA PCR Screening     Status: Abnormal   Collection Time: 08/06/19 12:15 PM   Specimen: Nasal Mucosa; Nasopharyngeal  Result Value Ref Range Status   MRSA by PCR POSITIVE (A) NEGATIVE Final    Comment:        The GeneXpert MRSA Assay (FDA approved for NASAL specimens only), is one component of a comprehensive MRSA colonization surveillance program. It is not intended to diagnose MRSA infection nor to guide or monitor treatment for MRSA infections. RESULT CALLED TO, READ BACK BY AND VERIFIED WITH: Torrie Mayers LPN 27:61 08/12/07 ([email or web address]) Performed at [facility], [place] [address], [place], [place] [address]       Radiology Studies: DG CHEST PORT 1 VIEW  Result Date: 08/08/2019 CLINICAL DATA:  Shortness of breath EXAM: PORTABLE CHEST 1 VIEW COMPARISON:  02/18/2019 FINDINGS: The heart size and mediastinal contours are within normal limits. Right upper extremity PICC, tip positioned over the right subclavian vessels. Both lungs are clear. The visualized skeletal structures are unremarkable. IMPRESSION: 1.  No acute abnormality of the lungs. 2. Right upper extremity PICC, tip positioned over the right subclavian vessels.  Electronically Signed   By: Eddie Candle M.D.   On: 08/08/2019 20:41     LOS: 6 days    Alma Friendly, MD [facility]  08/09/2019, 2:04 PM

## 2019-08-09 NOTE — Progress Notes (Signed)
Pt wants to leave AMA; on-call paged.

## 2019-08-09 NOTE — Progress Notes (Signed)
Pt has called staff into room; states she cannot stay here any longer. Primary RN asked pt if she could stay long enough to have her 2200 dose of abx, but pt refused, stating, "I can't, it will take too long to go in". Risks of leaving AMA reviewed with pt, pt verbalizes understanding, but states " I just need to get home to my kids and back to work, I can't stay here anymore, I have too much to do at home". Pt remained calm and respectful throughout her interaction with staff, but confirmed her intention of leaving this evening. On call provider notified via text of pt's decision. AMA form signed by pt & witnessed by pt's primary RN. PIV removed from pt's arm. Pt gathered her belongings and was escorted to front lobby where her transportation was waiting. Pt states she will either call or come back in the morning to speak with ID regarding any possibility of alternative plan to receive her IV abx therapy; primary RN will inform oncoming CN in AM so ID can be notified immediately for f/u.

## 2019-08-09 NOTE — Progress Notes (Signed)
Patient has been asking about alternative treatment for her infection. She wants to recover, but has a lot of anxiety about being hospitalized for 6 weeks for IV infusions. I explained that her history of Heroin abuse is the concern and the reason why she is unable to do home infusions. She understands completely, but tries to assure me she would not tamper with a PICC line. She states that she's feeling socially isolated and missing her 2 young daughters and she doesn't think she can do another month of inpatient treatment. She has asked about speaking to her ID doctor twice today. I have notified her hospitalist.

## 2019-08-10 ENCOUNTER — Other Ambulatory Visit: Payer: Self-pay | Admitting: Internal Medicine

## 2019-08-10 LAB — CULTURE, BLOOD (ROUTINE X 2): Culture: NO GROWTH

## 2019-08-10 MED ORDER — LINEZOLID 600 MG PO TABS: 600.0000 mg | ORAL_TABLET | Freq: Two times a day (BID) | ORAL | 0 refills | Status: DC

## 2019-08-10 MED ORDER — LINEZOLID 600 MG PO TABS: 600.0000 mg | ORAL_TABLET | Freq: Two times a day (BID) | ORAL | Status: DC

## 2019-08-10 NOTE — Discharge Summary (Addendum)
Discharge Summary  Latoya [name] PYP:[id number] DOB: Dec 22, 1985  PCP: Patient, No Pcp Per  Admit date: 08/03/2019 Discharge date: 08/09/2019  Time spent: 35 mins  Recommendations for Outpatient Follow-up:  1. Left AMA  Discharge Diagnoses:  Active Hospital Problems   Diagnosis Date Noted  . Infection of lumbar spine (HCC) 08/03/2019  . Microcytic hypochromic anemia 08/03/2019  . MRSA (methicillin resistant staph aureus) culture positive 08/03/2019  . IV drug abuse (HCC) 07/29/2019  . HCV antibody positive 04/05/2019    Resolved Hospital Problems  No resolved problems to display.    Discharge Condition: Left AMA  Diet recommendation:   Vitals:   08/09/19 1825 08/09/19 2128  BP: 122/85 116/78  Pulse: 97 96  Resp: 16 16  Temp:  98 F (36.7 C)  SpO2: 100% 100%    History of present illness:  34 y.o.femalewith medical history significant ofIV heroin abuseand L4-5 septic arthritis with MRSA presents with complaints of back pain after leaving AMA from the hospital on 3/28. She was admitted on 3/23 for sepsis secondary to L4-5facet septic arthritis-s/p fluoroscopy guided aspiration and cultures grew MRSA, seen by ID and was planned for 6 weeks of IV vancomycin. She reports she had to leave AMA and apparently admitted using heroin but was having severe lumbar back pain that was sharp and radiating so came to the ER. Patient usually injects herself in the lower extremities. Upon admission into the emergency department patient was noted to beafebrile, tachycardic, and all other vital signs maintained. Blood cultures were again obtained. Patient admitted for further management.    Patient signed AMA, stating she cannot stay here any longer.     Hospital Course:  Principal Problem:   Infection of lumbar spine (HCC) Active Problems:   HCV antibody positive   IV drug abuse (HCC)   Microcytic hypochromic anemia   MRSA (methicillin resistant staph aureus) culture  positive   Recently diagnosed L4-5 facet joint MRSA infection in the setting of IV drug abuse Repeat blood cultures NGTD Discussed with ID Dr. Orvan Falconer on 08/10/2019, stated he will follow up with her as an outpatient and possible sent prescriptions for p.o. linezolid Status post IV vancomycin Follow-up with ID as an outpatient  IV heroin abuse/substance abuse Continue counseling on cessation of heroin  Microcytic hypochromic anemia/iron deficiency anemia Chronic, hemoglobin is stable Continue oral iron supplementation  Hx of Hepatitis C  LFTs stable        Malnutrition Type:      Malnutrition Characteristics:      Nutrition Interventions:      Estimated body mass index is 26.63 kg/m as calculated from the following:   Height as of this encounter: 5\' 6"  (1.676 m).   Weight as of this encounter: 74.8 kg.    Procedures:  None  Consultations:  ID  Discharge Exam: BP 116/78 (BP Location: Right Arm)   Pulse 96   Temp 98 F (36.7 C)   Resp 16   Ht 5\' 6"  (1.676 m)   Wt 74.8 kg   SpO2 100%   BMI 26.63 kg/m   General: Signed AMA Cardiovascular:  Respiratory:   Discharge Instructions You were cared for by a hospitalist during your hospital stay. If you have any questions about your discharge medications or the care you received while you were in the hospital after you are discharged, you can call the unit and asked to speak with the hospitalist on call if the hospitalist that took care of you is not available.  Once you are discharged, your primary care physician will handle any further medical issues. Please note that NO REFILLS for any discharge medications will be authorized once you are discharged, as it is imperative that you return to your primary care physician (or establish a relationship with a primary care physician if you do not have one) for your aftercare needs so that they can reassess your need for medications and monitor your lab  values.   Allergies as of 08/09/2019      Reactions   Naltrexone Anxiety   Severe anxiety, restlessness, vomiting      Medication List    You have not been prescribed any medications.    Allergies  Allergen Reactions  . Naltrexone Anxiety    Severe anxiety, restlessness, vomiting      The results of significant diagnostics from this hospitalization (including imaging, microbiology, ancillary and laboratory) are listed below for reference.    Significant Diagnostic Studies: MR Lumbar Spine W Wo Contrast  Result Date: 07/28/2019 CLINICAL DATA:  Initial evaluation for acute fever and back pain. History of IV drug abuse. EXAM: MRI LUMBAR SPINE WITHOUT AND WITH CONTRAST TECHNIQUE: Multiplanar and multiecho pulse sequences of the lumbar spine were obtained without and with intravenous contrast. CONTRAST:  72mL GADAVIST GADOBUTROL 1 MMOL/ML IV SOLN COMPARISON:  Prior CT from 01/13/2019 FINDINGS: Segmentation: Standard. Lowest well-formed disc space labeled the L5-S1 level. Alignment: Levoscoliosis. Alignment otherwise normal with preservation of the normal lumbar lordosis. No listhesis. Vertebrae: Vertebral body height maintained without evidence for acute or chronic fracture. Bone marrow signal intensity diffusely decreased on T1 weighted imaging, nonspecific, but most commonly related to anemia, smoking, or obesity. Mild reactive marrow edema seen about the right greater than left L4-5 facets bilaterally. Associated trace bilateral joint effusions. While these findings could be degenerative in nature, possible septic arthritis could also be considered given the provided history. No other abnormal marrow edema or enhancement to suggest osteomyelitis or discitis. Subcentimeter benign hemangioma noted within the L3 vertebral body. No other discrete or worrisome osseous lesions. Conus medullaris and cauda equina: Conus extends to the L1 level. Conus and cauda equina appear normal. Mild epidural  enhancement seen adjacent to the L4-5 facets without frank epidural abscess or collection (series 18, image 11). No other evidence for acute intracanalicular infection. Paraspinal and other soft tissues: Soft tissue edema and enhancement seen within the right greater than left posterior paraspinous soft tissues adjacent to the L4-5 facets. No discrete soft tissue abscess or drainable fluid collection seen on this motion degraded exam. Visualized soft tissues otherwise within normal limits. Partially visualized visceral structures unremarkable. Disc levels: L1-2:  Unremarkable. L2-3: Disc desiccation with mild annular disc bulge. Endplate Schmorl's node deformity. No stenosis or impingement. L3-4:  Minimal annular disc bulge.  No stenosis or impingement. L4-5: Negative interspace. Bilateral facet hypertrophy with associated trace bilateral joint effusions. Resultant mild spinal stenosis. Foramina remain patent. L5-S1: Negative interspace. Mild right greater than left facet hypertrophy. No stenosis. IMPRESSION: 1. Reactive marrow edema and enhancement about the right greater than left L4-5 facets. While these findings may in part be degenerative in nature, appearance is concerning for possible acute septic arthritis given provided history. Associated mild epidural and paraspinous soft tissue enhancement without frank epidural or soft tissue abscess. 2. No other evidence for acute infection within the lumbar spine. Electronically Signed   By: Jeannine Boga M.D.   On: 07/28/2019 22:42   DG CHEST PORT 1 VIEW  Result Date: 08/08/2019 CLINICAL  DATA:  Shortness of breath EXAM: PORTABLE CHEST 1 VIEW COMPARISON:  02/18/2019 FINDINGS: The heart size and mediastinal contours are within normal limits. Right upper extremity PICC, tip positioned over the right subclavian vessels. Both lungs are clear. The visualized skeletal structures are unremarkable. IMPRESSION: 1.  No acute abnormality of the lungs. 2. Right upper  extremity PICC, tip positioned over the right subclavian vessels. Electronically Signed   By: Lauralyn Primes M.D.   On: 08/08/2019 20:41   [place] EKG SITE RITE  Result Date: 08/06/2019 If Site Rite image not attached, placement could not be confirmed due to current cardiac rhythm.  [place] EKG SITE RITE  Result Date: 07/31/2019 If Site Rite image not attached, placement could not be confirmed due to current cardiac rhythm.   Microbiology: Recent Results (from the past 240 hour(s))  Blood culture (routine x 2)     Status: None   Collection Time: 08/03/19  4:59 AM   Specimen: BLOOD  Result Value Ref Range Status   Specimen Description BLOOD LEFT ANTECUBITAL  Final   Special Requests   Final    BOTTLES DRAWN AEROBIC AND ANAEROBIC Blood Culture results may not be optimal due to an inadequate volume of blood received in culture bottles   Culture   Final    NO GROWTH 5 DAYS Performed at [facility], [address] [place], [place] [address]    Report Status 08/08/2019 FINAL  Final  SARS CORONAVIRUS 2 (TAT 6-24 HRS) Nasopharyngeal Nasopharyngeal Swab     Status: None   Collection Time: 08/03/19  7:50 AM   Specimen: Nasopharyngeal Swab  Result Value Ref Range Status   SARS Coronavirus 2 NEGATIVE NEGATIVE Final    Comment: (NOTE) SARS-CoV-2 target nucleic acids are NOT DETECTED. The SARS-CoV-2 RNA is generally detectable in upper and lower respiratory specimens during the acute phase of infection. Negative results do not preclude SARS-CoV-2 infection, do not rule out co-infections with other pathogens, and should not be used as the sole basis for treatment or other patient management decisions. Negative results must be combined with clinical observations, patient history, and epidemiological information. The expected result is Negative. Fact Sheet for Patients: HairSlick.no Fact Sheet for Healthcare Providers: quierodirigir.com This  test is not yet approved or cleared by the [place] FDA and  has been authorized for detection and/or diagnosis of SARS-CoV-2 by FDA under an Emergency Use Authorization (EUA). This EUA will remain  in effect (meaning this test can be used) for the duration of the COVID-19 declaration under Section 56 4(b)(1) of the Act, 21 U.S.C. section 360bbb-3(b)(1), unless the authorization is terminated or revoked sooner. Performed at [facility], [address], [place], [place] [address]   Blood culture (routine x 2)     Status: None   Collection Time: 08/03/19  9:08 AM   Specimen: BLOOD  Result Value Ref Range Status   Specimen Description BLOOD SITE NOT SPECIFIED  Final   Special Requests   Final    BOTTLES DRAWN AEROBIC ONLY Blood Culture results may not be optimal due to an inadequate volume of blood received in culture bottles   Culture   Final    NO GROWTH 5 DAYS Performed at [facility], [address], [place], [place] [address]    Report Status 08/10/2019 FINAL  Final  MRSA PCR Screening     Status: Abnormal   Collection Time: 08/06/19 12:15 PM   Specimen: Nasal Mucosa; Nasopharyngeal  Result  Value Ref Range Status   MRSA by PCR POSITIVE (A) NEGATIVE Final    Comment:        The GeneXpert MRSA Assay (FDA approved for NASAL specimens only), is one component of a comprehensive MRSA colonization surveillance program. It is not intended to diagnose MRSA infection nor to guide or monitor treatment for MRSA infections. RESULT CALLED TO, READ BACK BY AND VERIFIED WITH: Vernie Ammons LPN 49:70 06/13/35 ([email or web address]) Performed at [facility], [address], [place], [place] [address]      Labs: Basic Metabolic Panel: Recent Labs  Lab 08/04/19 240 125 [id number] 08/05/19 0311 08/07/19 0427  NA 140 142 139  K 4.2 3.9 4.2  CL 104 107 103  CO2 27 27 27   GLUCOSE 93 98 91  BUN 9 9 11   CREATININE 0.56 0.47 0.69  CALCIUM 8.8* 8.7* 8.3*   Liver Function Tests: No  results for input(s): AST, ALT, ALKPHOS, BILITOT, PROT, ALBUMIN in the last 168 hours. No results for input(s): LIPASE, AMYLASE in the last 168 hours. No results for input(s): AMMONIA in the last 168 hours. CBC: Recent Labs  Lab 08/05/19 0311 08/07/19 0427  WBC 6.6 11.7*  HGB 9.6* 9.8*  HCT 31.4* 32.4*  MCV 77.9* 79.0*  PLT 242 255   Cardiac Enzymes: No results for input(s): CKTOTAL, CKMB, CKMBINDEX, TROPONINI in the last 168 hours. BNP: BNP (last 3 results) No results for input(s): BNP in the last 8760 hours.  ProBNP (last 3 results) No results for input(s): PROBNP in the last 8760 hours.  CBG: No results for input(s): GLUCAP in the last 168 hours.     Signed:  08/07/19, MD [facility] 08/10/2019, 6:20 PM

## 2019-08-10 NOTE — Progress Notes (Signed)
Attempted to contact patient ~ 4 times and left voicemail in an effort to ask if patient is willing to take oral linezolid 600 mg BID for at least 28 days given that Ms. Amore left AMA and did not finish course of IV antibiotics.  IV antibiotics are preferred for Ms. Stutt's infection but due to previous history, a PICC line is not a great option. Awaiting patient to call back so we can discuss where to send in linezolid prescription.   Alvia Grove, PharmD PGY1 Acute Care Pharmacy Resident

## 2019-08-11 NOTE — Progress Notes (Signed)
Called patient and was able to discuss with Latoya [name] the option to take oral linezolid an follow up in ID clinic. Patient agreed and was pleased to hear about this alternative option. Prescription for linezolid was sent to [facility]. Patient stated that she will go and pick up. I also educated Latoya [name] to take the linezolid 600 mg tablets BID for the entire 28 days. We talked about the importance of setting up mychart to help aid scheduling appointments and keeping track of medication changes.  Alvia Grove, PharmD PGY1 Acute Care Pharmacy Resident

## 2019-08-12 MED FILL — LINEZOLID 600 MG TAB: 600 | 28 days supply | Qty: 56 | Fill #0

## 2019-09-09 ENCOUNTER — Inpatient Hospital Stay: Payer: Medicaid Other | Admitting: Internal Medicine

## 2019-10-26 DIAGNOSIS — J189 Pneumonia, unspecified organism: Secondary | ICD-10-CM | POA: Diagnosis not present

## 2019-10-26 DIAGNOSIS — R531 Weakness: Secondary | ICD-10-CM | POA: Diagnosis not present

## 2019-10-26 DIAGNOSIS — I2690 Septic pulmonary embolism without acute cor pulmonale: Secondary | ICD-10-CM | POA: Diagnosis not present

## 2019-10-26 DIAGNOSIS — R0602 Shortness of breath: Secondary | ICD-10-CM | POA: Diagnosis not present

## 2019-10-26 DIAGNOSIS — A419 Sepsis, unspecified organism: Secondary | ICD-10-CM | POA: Diagnosis not present

## 2019-10-26 DIAGNOSIS — R7989 Other specified abnormal findings of blood chemistry: Secondary | ICD-10-CM | POA: Diagnosis not present

## 2019-10-26 DIAGNOSIS — I2693 Single subsegmental pulmonary embolism without acute cor pulmonale: Secondary | ICD-10-CM | POA: Diagnosis not present

## 2019-10-27 DIAGNOSIS — Z9981 Dependence on supplemental oxygen: Secondary | ICD-10-CM | POA: Diagnosis not present

## 2019-10-27 DIAGNOSIS — Z7982 Long term (current) use of aspirin: Secondary | ICD-10-CM | POA: Diagnosis not present

## 2019-10-27 DIAGNOSIS — B1920 Unspecified viral hepatitis C without hepatic coma: Secondary | ICD-10-CM

## 2019-10-27 DIAGNOSIS — A412 Sepsis due to unspecified staphylococcus: Secondary | ICD-10-CM | POA: Diagnosis not present

## 2019-10-27 DIAGNOSIS — I2690 Septic pulmonary embolism without acute cor pulmonale: Secondary | ICD-10-CM

## 2019-10-27 DIAGNOSIS — I361 Nonrheumatic tricuspid (valve) insufficiency: Secondary | ICD-10-CM | POA: Diagnosis not present

## 2019-10-27 DIAGNOSIS — D509 Iron deficiency anemia, unspecified: Secondary | ICD-10-CM

## 2019-10-27 DIAGNOSIS — Z792 Long term (current) use of antibiotics: Secondary | ICD-10-CM | POA: Diagnosis not present

## 2019-10-27 DIAGNOSIS — I38 Endocarditis, valve unspecified: Secondary | ICD-10-CM | POA: Diagnosis not present

## 2019-10-27 DIAGNOSIS — I2699 Other pulmonary embolism without acute cor pulmonale: Secondary | ICD-10-CM | POA: Diagnosis not present

## 2019-10-27 DIAGNOSIS — F17210 Nicotine dependence, cigarettes, uncomplicated: Secondary | ICD-10-CM | POA: Diagnosis not present

## 2019-10-27 DIAGNOSIS — J189 Pneumonia, unspecified organism: Secondary | ICD-10-CM | POA: Diagnosis not present

## 2019-10-27 DIAGNOSIS — R7989 Other specified abnormal findings of blood chemistry: Secondary | ICD-10-CM | POA: Diagnosis not present

## 2019-10-27 DIAGNOSIS — A419 Sepsis, unspecified organism: Secondary | ICD-10-CM | POA: Diagnosis not present

## 2019-10-27 DIAGNOSIS — I2721 Secondary pulmonary arterial hypertension: Secondary | ICD-10-CM

## 2019-10-27 DIAGNOSIS — R0602 Shortness of breath: Secondary | ICD-10-CM | POA: Diagnosis not present

## 2019-10-27 DIAGNOSIS — F1990 Other psychoactive substance use, unspecified, uncomplicated: Secondary | ICD-10-CM | POA: Diagnosis not present

## 2019-10-27 DIAGNOSIS — A4102 Sepsis due to Methicillin resistant Staphylococcus aureus: Secondary | ICD-10-CM | POA: Diagnosis not present

## 2019-10-27 DIAGNOSIS — F329 Major depressive disorder, single episode, unspecified: Secondary | ICD-10-CM | POA: Diagnosis not present

## 2019-10-27 DIAGNOSIS — M5489 Other dorsalgia: Secondary | ICD-10-CM | POA: Diagnosis not present

## 2019-10-27 DIAGNOSIS — I079 Rheumatic tricuspid valve disease, unspecified: Secondary | ICD-10-CM | POA: Diagnosis not present

## 2019-10-28 DIAGNOSIS — B1920 Unspecified viral hepatitis C without hepatic coma: Secondary | ICD-10-CM | POA: Diagnosis not present

## 2019-10-28 DIAGNOSIS — Z9981 Dependence on supplemental oxygen: Secondary | ICD-10-CM | POA: Diagnosis not present

## 2019-10-28 DIAGNOSIS — I2721 Secondary pulmonary arterial hypertension: Secondary | ICD-10-CM | POA: Diagnosis not present

## 2019-10-28 DIAGNOSIS — F1990 Other psychoactive substance use, unspecified, uncomplicated: Secondary | ICD-10-CM

## 2019-10-28 DIAGNOSIS — I361 Nonrheumatic tricuspid (valve) insufficiency: Secondary | ICD-10-CM | POA: Diagnosis not present

## 2019-10-28 DIAGNOSIS — D509 Iron deficiency anemia, unspecified: Secondary | ICD-10-CM | POA: Diagnosis not present

## 2019-10-28 DIAGNOSIS — I2690 Septic pulmonary embolism without acute cor pulmonale: Secondary | ICD-10-CM | POA: Diagnosis not present

## 2019-10-28 DIAGNOSIS — I38 Endocarditis, valve unspecified: Secondary | ICD-10-CM | POA: Diagnosis not present

## 2019-10-28 DIAGNOSIS — A419 Sepsis, unspecified organism: Secondary | ICD-10-CM | POA: Diagnosis not present

## 2019-10-29 DIAGNOSIS — B1920 Unspecified viral hepatitis C without hepatic coma: Secondary | ICD-10-CM | POA: Diagnosis not present

## 2019-10-29 DIAGNOSIS — I361 Nonrheumatic tricuspid (valve) insufficiency: Secondary | ICD-10-CM | POA: Diagnosis not present

## 2019-10-29 DIAGNOSIS — A419 Sepsis, unspecified organism: Secondary | ICD-10-CM | POA: Diagnosis not present

## 2019-10-29 DIAGNOSIS — I38 Endocarditis, valve unspecified: Secondary | ICD-10-CM | POA: Diagnosis not present

## 2019-10-29 DIAGNOSIS — I2721 Secondary pulmonary arterial hypertension: Secondary | ICD-10-CM | POA: Diagnosis not present

## 2019-10-29 DIAGNOSIS — D509 Iron deficiency anemia, unspecified: Secondary | ICD-10-CM | POA: Diagnosis not present

## 2019-10-29 DIAGNOSIS — I2690 Septic pulmonary embolism without acute cor pulmonale: Secondary | ICD-10-CM | POA: Diagnosis not present

## 2019-10-29 DIAGNOSIS — F1990 Other psychoactive substance use, unspecified, uncomplicated: Secondary | ICD-10-CM | POA: Diagnosis not present

## 2019-10-29 DIAGNOSIS — R918 Other nonspecific abnormal finding of lung field: Secondary | ICD-10-CM | POA: Diagnosis not present

## 2019-10-30 DIAGNOSIS — I38 Endocarditis, valve unspecified: Secondary | ICD-10-CM | POA: Diagnosis not present

## 2019-10-30 DIAGNOSIS — I2721 Secondary pulmonary arterial hypertension: Secondary | ICD-10-CM | POA: Diagnosis not present

## 2019-10-30 DIAGNOSIS — B1920 Unspecified viral hepatitis C without hepatic coma: Secondary | ICD-10-CM | POA: Diagnosis not present

## 2019-10-30 DIAGNOSIS — D509 Iron deficiency anemia, unspecified: Secondary | ICD-10-CM | POA: Diagnosis not present

## 2019-10-30 DIAGNOSIS — F1990 Other psychoactive substance use, unspecified, uncomplicated: Secondary | ICD-10-CM | POA: Diagnosis not present

## 2019-10-30 DIAGNOSIS — I361 Nonrheumatic tricuspid (valve) insufficiency: Secondary | ICD-10-CM | POA: Diagnosis not present

## 2019-10-30 DIAGNOSIS — I2690 Septic pulmonary embolism without acute cor pulmonale: Secondary | ICD-10-CM | POA: Diagnosis not present

## 2019-10-30 DIAGNOSIS — A419 Sepsis, unspecified organism: Secondary | ICD-10-CM | POA: Diagnosis not present

## 2019-10-31 DIAGNOSIS — I2721 Secondary pulmonary arterial hypertension: Secondary | ICD-10-CM | POA: Diagnosis not present

## 2019-10-31 DIAGNOSIS — I2690 Septic pulmonary embolism without acute cor pulmonale: Secondary | ICD-10-CM | POA: Diagnosis not present

## 2019-10-31 DIAGNOSIS — A4102 Sepsis due to Methicillin resistant Staphylococcus aureus: Secondary | ICD-10-CM | POA: Diagnosis not present

## 2019-10-31 DIAGNOSIS — D509 Iron deficiency anemia, unspecified: Secondary | ICD-10-CM | POA: Diagnosis not present

## 2019-10-31 DIAGNOSIS — B1920 Unspecified viral hepatitis C without hepatic coma: Secondary | ICD-10-CM | POA: Diagnosis not present

## 2019-10-31 DIAGNOSIS — I2699 Other pulmonary embolism without acute cor pulmonale: Secondary | ICD-10-CM | POA: Diagnosis not present

## 2019-11-01 DIAGNOSIS — I2721 Secondary pulmonary arterial hypertension: Secondary | ICD-10-CM | POA: Diagnosis not present

## 2019-11-01 DIAGNOSIS — D509 Iron deficiency anemia, unspecified: Secondary | ICD-10-CM | POA: Diagnosis not present

## 2019-11-01 DIAGNOSIS — B1920 Unspecified viral hepatitis C without hepatic coma: Secondary | ICD-10-CM | POA: Diagnosis not present

## 2019-11-01 DIAGNOSIS — I2690 Septic pulmonary embolism without acute cor pulmonale: Secondary | ICD-10-CM | POA: Diagnosis not present

## 2019-11-02 DIAGNOSIS — I38 Endocarditis, valve unspecified: Secondary | ICD-10-CM | POA: Diagnosis not present

## 2019-11-02 DIAGNOSIS — B1920 Unspecified viral hepatitis C without hepatic coma: Secondary | ICD-10-CM | POA: Diagnosis not present

## 2019-11-02 DIAGNOSIS — I2721 Secondary pulmonary arterial hypertension: Secondary | ICD-10-CM | POA: Diagnosis not present

## 2019-11-02 DIAGNOSIS — D509 Iron deficiency anemia, unspecified: Secondary | ICD-10-CM | POA: Diagnosis not present

## 2019-11-02 DIAGNOSIS — I2690 Septic pulmonary embolism without acute cor pulmonale: Secondary | ICD-10-CM | POA: Diagnosis not present

## 2019-11-03 DIAGNOSIS — D519 Vitamin B12 deficiency anemia, unspecified: Secondary | ICD-10-CM | POA: Diagnosis not present

## 2019-11-03 DIAGNOSIS — I361 Nonrheumatic tricuspid (valve) insufficiency: Secondary | ICD-10-CM | POA: Diagnosis not present

## 2019-11-03 DIAGNOSIS — I2690 Septic pulmonary embolism without acute cor pulmonale: Secondary | ICD-10-CM | POA: Diagnosis not present

## 2019-11-03 DIAGNOSIS — D509 Iron deficiency anemia, unspecified: Secondary | ICD-10-CM | POA: Diagnosis not present

## 2019-11-03 DIAGNOSIS — I272 Pulmonary hypertension, unspecified: Secondary | ICD-10-CM | POA: Diagnosis not present

## 2019-11-03 DIAGNOSIS — B9562 Methicillin resistant Staphylococcus aureus infection as the cause of diseases classified elsewhere: Secondary | ICD-10-CM | POA: Diagnosis not present

## 2019-11-03 DIAGNOSIS — A4102 Sepsis due to Methicillin resistant Staphylococcus aureus: Secondary | ICD-10-CM | POA: Diagnosis not present

## 2019-11-03 DIAGNOSIS — M545 Low back pain: Secondary | ICD-10-CM | POA: Diagnosis not present

## 2019-11-03 DIAGNOSIS — I079 Rheumatic tricuspid valve disease, unspecified: Secondary | ICD-10-CM | POA: Diagnosis not present

## 2019-11-03 DIAGNOSIS — Z5329 Procedure and treatment not carried out because of patient's decision for other reasons: Secondary | ICD-10-CM | POA: Diagnosis not present

## 2019-11-03 DIAGNOSIS — E876 Hypokalemia: Secondary | ICD-10-CM | POA: Diagnosis not present

## 2019-11-03 DIAGNOSIS — I38 Endocarditis, valve unspecified: Secondary | ICD-10-CM

## 2019-11-03 DIAGNOSIS — I330 Acute and subacute infective endocarditis: Secondary | ICD-10-CM | POA: Diagnosis not present

## 2019-11-03 DIAGNOSIS — E860 Dehydration: Secondary | ICD-10-CM | POA: Diagnosis not present

## 2019-11-04 DIAGNOSIS — I38 Endocarditis, valve unspecified: Secondary | ICD-10-CM | POA: Diagnosis not present

## 2019-11-04 DIAGNOSIS — I2690 Septic pulmonary embolism without acute cor pulmonale: Secondary | ICD-10-CM | POA: Diagnosis not present

## 2019-11-04 DIAGNOSIS — I361 Nonrheumatic tricuspid (valve) insufficiency: Secondary | ICD-10-CM | POA: Diagnosis not present

## 2019-11-04 DIAGNOSIS — I079 Rheumatic tricuspid valve disease, unspecified: Secondary | ICD-10-CM | POA: Diagnosis not present

## 2019-11-04 DIAGNOSIS — J181 Lobar pneumonia, unspecified organism: Secondary | ICD-10-CM | POA: Diagnosis not present

## 2019-11-04 DIAGNOSIS — J90 Pleural effusion, not elsewhere classified: Secondary | ICD-10-CM | POA: Diagnosis not present

## 2019-11-05 DIAGNOSIS — I2690 Septic pulmonary embolism without acute cor pulmonale: Secondary | ICD-10-CM | POA: Diagnosis not present

## 2019-11-05 DIAGNOSIS — I330 Acute and subacute infective endocarditis: Secondary | ICD-10-CM | POA: Diagnosis not present

## 2019-11-05 DIAGNOSIS — B9562 Methicillin resistant Staphylococcus aureus infection as the cause of diseases classified elsewhere: Secondary | ICD-10-CM | POA: Diagnosis not present

## 2019-11-05 DIAGNOSIS — A4102 Sepsis due to Methicillin resistant Staphylococcus aureus: Secondary | ICD-10-CM | POA: Diagnosis not present

## 2019-11-06 DIAGNOSIS — A4102 Sepsis due to Methicillin resistant Staphylococcus aureus: Secondary | ICD-10-CM | POA: Diagnosis not present

## 2019-11-06 DIAGNOSIS — I2690 Septic pulmonary embolism without acute cor pulmonale: Secondary | ICD-10-CM | POA: Diagnosis not present

## 2019-11-06 DIAGNOSIS — B9562 Methicillin resistant Staphylococcus aureus infection as the cause of diseases classified elsewhere: Secondary | ICD-10-CM | POA: Diagnosis not present

## 2019-11-06 DIAGNOSIS — I330 Acute and subacute infective endocarditis: Secondary | ICD-10-CM | POA: Diagnosis not present

## 2019-11-07 DIAGNOSIS — I330 Acute and subacute infective endocarditis: Secondary | ICD-10-CM | POA: Diagnosis not present

## 2019-11-07 DIAGNOSIS — A4102 Sepsis due to Methicillin resistant Staphylococcus aureus: Secondary | ICD-10-CM | POA: Diagnosis not present

## 2019-11-07 DIAGNOSIS — B9562 Methicillin resistant Staphylococcus aureus infection as the cause of diseases classified elsewhere: Secondary | ICD-10-CM | POA: Diagnosis not present

## 2019-11-07 DIAGNOSIS — I2690 Septic pulmonary embolism without acute cor pulmonale: Secondary | ICD-10-CM | POA: Diagnosis not present

## 2019-11-08 DIAGNOSIS — I330 Acute and subacute infective endocarditis: Secondary | ICD-10-CM | POA: Diagnosis not present

## 2019-11-08 DIAGNOSIS — I2690 Septic pulmonary embolism without acute cor pulmonale: Secondary | ICD-10-CM | POA: Diagnosis not present

## 2019-11-08 DIAGNOSIS — B9562 Methicillin resistant Staphylococcus aureus infection as the cause of diseases classified elsewhere: Secondary | ICD-10-CM | POA: Diagnosis not present

## 2019-11-08 DIAGNOSIS — A4102 Sepsis due to Methicillin resistant Staphylococcus aureus: Secondary | ICD-10-CM | POA: Diagnosis not present

## 2019-11-09 DIAGNOSIS — I2690 Septic pulmonary embolism without acute cor pulmonale: Secondary | ICD-10-CM | POA: Diagnosis not present

## 2019-11-09 DIAGNOSIS — B9562 Methicillin resistant Staphylococcus aureus infection as the cause of diseases classified elsewhere: Secondary | ICD-10-CM | POA: Diagnosis not present

## 2019-11-09 DIAGNOSIS — I330 Acute and subacute infective endocarditis: Secondary | ICD-10-CM | POA: Diagnosis not present

## 2019-11-09 DIAGNOSIS — A4102 Sepsis due to Methicillin resistant Staphylococcus aureus: Secondary | ICD-10-CM | POA: Diagnosis not present

## 2019-11-13 DIAGNOSIS — B182 Chronic viral hepatitis C: Secondary | ICD-10-CM | POA: Diagnosis not present

## 2019-11-13 DIAGNOSIS — I339 Acute and subacute endocarditis, unspecified: Secondary | ICD-10-CM | POA: Diagnosis not present

## 2019-11-13 DIAGNOSIS — I330 Acute and subacute infective endocarditis: Secondary | ICD-10-CM | POA: Diagnosis not present

## 2019-11-13 DIAGNOSIS — F17210 Nicotine dependence, cigarettes, uncomplicated: Secondary | ICD-10-CM | POA: Diagnosis not present

## 2019-11-13 DIAGNOSIS — I2690 Septic pulmonary embolism without acute cor pulmonale: Secondary | ICD-10-CM | POA: Diagnosis not present

## 2019-11-13 DIAGNOSIS — R600 Localized edema: Secondary | ICD-10-CM | POA: Diagnosis not present

## 2019-11-13 DIAGNOSIS — R0602 Shortness of breath: Secondary | ICD-10-CM | POA: Diagnosis not present

## 2019-11-13 DIAGNOSIS — D509 Iron deficiency anemia, unspecified: Secondary | ICD-10-CM | POA: Diagnosis not present

## 2019-11-13 DIAGNOSIS — I079 Rheumatic tricuspid valve disease, unspecified: Secondary | ICD-10-CM | POA: Diagnosis not present

## 2019-11-13 DIAGNOSIS — Z8614 Personal history of Methicillin resistant Staphylococcus aureus infection: Secondary | ICD-10-CM | POA: Diagnosis not present

## 2019-11-14 DIAGNOSIS — M7989 Other specified soft tissue disorders: Secondary | ICD-10-CM | POA: Diagnosis not present

## 2019-11-14 DIAGNOSIS — B182 Chronic viral hepatitis C: Secondary | ICD-10-CM | POA: Diagnosis not present

## 2019-11-14 DIAGNOSIS — I079 Rheumatic tricuspid valve disease, unspecified: Secondary | ICD-10-CM | POA: Diagnosis not present

## 2019-11-14 DIAGNOSIS — I330 Acute and subacute infective endocarditis: Secondary | ICD-10-CM | POA: Diagnosis not present

## 2019-11-14 DIAGNOSIS — I2690 Septic pulmonary embolism without acute cor pulmonale: Secondary | ICD-10-CM | POA: Diagnosis not present

## 2019-11-14 DIAGNOSIS — I2699 Other pulmonary embolism without acute cor pulmonale: Secondary | ICD-10-CM | POA: Diagnosis not present

## 2019-11-15 DIAGNOSIS — B182 Chronic viral hepatitis C: Secondary | ICD-10-CM | POA: Diagnosis not present

## 2019-11-15 DIAGNOSIS — I2690 Septic pulmonary embolism without acute cor pulmonale: Secondary | ICD-10-CM | POA: Diagnosis not present

## 2019-11-15 DIAGNOSIS — I079 Rheumatic tricuspid valve disease, unspecified: Secondary | ICD-10-CM | POA: Diagnosis not present

## 2019-11-16 DIAGNOSIS — B182 Chronic viral hepatitis C: Secondary | ICD-10-CM | POA: Diagnosis not present

## 2019-11-16 DIAGNOSIS — I079 Rheumatic tricuspid valve disease, unspecified: Secondary | ICD-10-CM | POA: Diagnosis not present

## 2019-11-16 DIAGNOSIS — I2690 Septic pulmonary embolism without acute cor pulmonale: Secondary | ICD-10-CM | POA: Diagnosis not present

## 2019-11-17 DIAGNOSIS — I079 Rheumatic tricuspid valve disease, unspecified: Secondary | ICD-10-CM | POA: Diagnosis not present

## 2019-11-17 DIAGNOSIS — I2690 Septic pulmonary embolism without acute cor pulmonale: Secondary | ICD-10-CM | POA: Diagnosis not present

## 2019-11-18 DIAGNOSIS — I079 Rheumatic tricuspid valve disease, unspecified: Secondary | ICD-10-CM | POA: Diagnosis not present

## 2019-11-18 DIAGNOSIS — I2690 Septic pulmonary embolism without acute cor pulmonale: Secondary | ICD-10-CM | POA: Diagnosis not present

## 2021-02-02 DIAGNOSIS — H04123 Dry eye syndrome of bilateral lacrimal glands: Secondary | ICD-10-CM | POA: Diagnosis not present

## 2021-02-02 DIAGNOSIS — H5213 Myopia, bilateral: Secondary | ICD-10-CM | POA: Diagnosis not present

## 2021-02-02 DIAGNOSIS — H40033 Anatomical narrow angle, bilateral: Secondary | ICD-10-CM | POA: Diagnosis not present

## 2021-02-18 DIAGNOSIS — H1013 Acute atopic conjunctivitis, bilateral: Secondary | ICD-10-CM | POA: Diagnosis not present

## 2021-02-18 DIAGNOSIS — H5213 Myopia, bilateral: Secondary | ICD-10-CM | POA: Diagnosis not present

## 2021-03-24 DIAGNOSIS — M545 Low back pain, unspecified: Secondary | ICD-10-CM | POA: Diagnosis not present

## 2021-03-24 DIAGNOSIS — R000 Tachycardia, unspecified: Secondary | ICD-10-CM | POA: Diagnosis not present

## 2021-03-24 DIAGNOSIS — R6883 Chills (without fever): Secondary | ICD-10-CM | POA: Diagnosis not present

## 2021-03-24 DIAGNOSIS — J9811 Atelectasis: Secondary | ICD-10-CM | POA: Diagnosis not present

## 2021-03-24 DIAGNOSIS — E872 Acidosis, unspecified: Secondary | ICD-10-CM | POA: Diagnosis not present

## 2021-03-24 DIAGNOSIS — M546 Pain in thoracic spine: Secondary | ICD-10-CM | POA: Diagnosis not present

## 2021-03-24 DIAGNOSIS — M549 Dorsalgia, unspecified: Secondary | ICD-10-CM | POA: Diagnosis not present

## 2021-03-25 DIAGNOSIS — E872 Acidosis, unspecified: Secondary | ICD-10-CM | POA: Diagnosis not present

## 2021-03-25 DIAGNOSIS — R112 Nausea with vomiting, unspecified: Secondary | ICD-10-CM | POA: Diagnosis not present

## 2021-03-25 DIAGNOSIS — F19939 Other psychoactive substance use, unspecified with withdrawal, unspecified: Secondary | ICD-10-CM | POA: Diagnosis not present

## 2021-03-26 DIAGNOSIS — E872 Acidosis, unspecified: Secondary | ICD-10-CM | POA: Diagnosis not present

## 2021-03-26 DIAGNOSIS — F19939 Other psychoactive substance use, unspecified with withdrawal, unspecified: Secondary | ICD-10-CM | POA: Diagnosis not present

## 2021-03-26 DIAGNOSIS — R112 Nausea with vomiting, unspecified: Secondary | ICD-10-CM | POA: Diagnosis not present

## 2021-04-06 DIAGNOSIS — Z419 Encounter for procedure for purposes other than remedying health state, unspecified: Secondary | ICD-10-CM | POA: Diagnosis not present

## 2021-05-07 DIAGNOSIS — Z419 Encounter for procedure for purposes other than remedying health state, unspecified: Secondary | ICD-10-CM | POA: Diagnosis not present

## 2021-06-07 DIAGNOSIS — Z419 Encounter for procedure for purposes other than remedying health state, unspecified: Secondary | ICD-10-CM | POA: Diagnosis not present

## 2021-06-21 ENCOUNTER — Other Ambulatory Visit: Payer: Self-pay

## 2021-06-21 ENCOUNTER — Ambulatory Visit
Admission: EM | Admit: 2021-06-21 | Discharge: 2021-06-21 | Disposition: A | Discharge status: Home / Self Care | Payer: Medicaid Other | Attending: Internal Medicine | Admitting: Internal Medicine

## 2021-06-21 DIAGNOSIS — R350 Frequency of micturition: Secondary | ICD-10-CM

## 2021-06-21 DIAGNOSIS — M545 Low back pain, unspecified: Secondary | ICD-10-CM | POA: Diagnosis not present

## 2021-06-21 LAB — POCT URINALYSIS DIP (MANUAL ENTRY)
Bilirubin, UA: NEGATIVE
Blood, UA: NEGATIVE
Glucose, UA: NEGATIVE mg/dL
Ketones, POC UA: NEGATIVE mg/dL
Leukocytes, UA: NEGATIVE
Nitrite, UA: NEGATIVE
Protein Ur, POC: NEGATIVE mg/dL
Spec Grav, UA: 1.015 (ref 1.010–1.025)
Urobilinogen, UA: 0.2 E.U./dL
pH, UA: 6.5 (ref 5.0–8.0)

## 2021-06-21 NOTE — ED Provider Notes (Signed)
[facility]    CSN: [id number] Arrival date & time: 06/21/21  1724      History   Chief Complaint Chief Complaint  Patient presents with   Flank Pain    left    HPI Latoya [name] is a 36 y.o. female.   Patient presents with 3-day history of left flank pain and urinary frequency.  Denies urinary burning, vaginal discharge, hematuria, pelvic pain, abdominal pain, fever.  Denies any known exposure to STD.  She has been taking ibuprofen with no improvement in symptoms.  Denies any injury to the back.  Denies urinary bowel incontinence or saddle anesthesia.  Patient does have a history of spinal infection due to IV drug use but reports that this feels different.   Flank Pain   Past Medical History:  Diagnosis Date   Heroin addiction ([place])    IV drug abuse ([place])     Patient Active Problem List   Diagnosis Date Noted   Microcytic hypochromic anemia 08/03/2019   MRSA (methicillin resistant staph aureus) culture positive 08/03/2019   Infection of lumbar spine ([place]) 08/03/2019   Lumbar discitis 07/31/2019   Sepsis ([place]) 07/29/2019   IV drug abuse ([place]) 07/29/2019   Opioid use disorder 04/05/2019   HCV antibody positive 04/05/2019   Anemia of infection and chronic disease 01/10/2019    Past Surgical History:  Procedure Laterality Date   IR LUMBAR DISC ASPIRATION W/IMG GUIDE  07/29/2019    OB History   No obstetric history on file.      Home Medications    Prior to Admission medications   Medication Sig Start Date End Date Taking? Authorizing Provider  linezolid (ZYVOX) 600 MG tablet Take 1 tablet (600 mg total) by mouth 2 (two) times daily. 08/10/19   Michel Bickers, MD    Family History Family History  Problem Relation Age of Onset   Healthy Mother     Social History Social History   Tobacco Use   Smoking status: Light Smoker    Types: Cigarettes   Smokeless tobacco: Never  Vaping Use   Vaping Use: Never used  Substance Use Topics    Alcohol use: Yes   Drug use: Yes    Types: Heroin    Comment: Heroin      Allergies   Naltrexone   Review of Systems Review of Systems Per HPI  Physical Exam Triage Vital Signs ED Triage Vitals  Enc Vitals Group     BP 06/21/21 1734 (!) 128/95     Pulse Rate 06/21/21 1734 95     Resp 06/21/21 1734 20     Temp 06/21/21 1734 97.9 F (36.6 C)     Temp Source 06/21/21 1734 Oral     SpO2 06/21/21 1734 98 %     Weight --      Height --      Head Circumference --      Peak Flow --      Pain Score 06/21/21 1736 8     Pain Loc --      Pain Edu? --      Excl. in [place]? --    No data found.  Updated Vital Signs BP (!) 128/95 (BP Location: Left Arm)    Pulse 95    Temp 97.9 F (36.6 C) (Oral)    Resp 20    SpO2 98%   Visual Acuity Right Eye Distance:   Left Eye Distance:   Bilateral Distance:    Right Eye  Near:   Left Eye Near:    Bilateral Near:     Physical Exam Constitutional:      General: She is not in acute distress.    Appearance: Normal appearance. She is not toxic-appearing or diaphoretic.  HENT:     Head: Normocephalic and atraumatic.  Eyes:     Extraocular Movements: Extraocular movements intact.     Conjunctiva/sclera: Conjunctivae normal.  Cardiovascular:     Rate and Rhythm: Normal rate and regular rhythm.     Pulses: Normal pulses.     Heart sounds: Normal heart sounds.  Pulmonary:     Effort: Pulmonary effort is normal. No respiratory distress.     Breath sounds: Normal breath sounds.  Musculoskeletal:     Cervical back: Normal.     Thoracic back: Normal.     Lumbar back: Tenderness present. No swelling, edema or bony tenderness. Negative right straight leg raise test and negative left straight leg raise test.       Back:     Comments: Tenderness to palpation to left lower back.  No direct spinal tenderness, crepitus, step-off.  Neurological:     General: No focal deficit present.     Mental Status: She is alert and oriented to person,  place, and time. Mental status is at baseline.     Deep Tendon Reflexes: Reflexes are normal and symmetric.  Psychiatric:        Mood and Affect: Mood normal.        Behavior: Behavior normal.        Thought Content: Thought content normal.        Judgment: Judgment normal.     UC Treatments / Results  Labs (all labs ordered are listed, but only abnormal results are displayed) Labs Reviewed  POCT URINALYSIS DIP (MANUAL ENTRY)    EKG   Radiology No results found.  Procedures Procedures (including critical care time)  Medications Ordered in UC Medications - No data to display  Initial Impression / Assessment and Plan / UC Course  I have reviewed the triage vital signs and the nursing notes.  Pertinent labs & imaging results that were available during my care of the patient were reviewed by me and considered in my medical decision making (see chart for details).     Urinalysis was completely clear.  Differential diagnoses include muscular strain versus pyelonephritis versus kidney stone versus spinal infection.  I do think that patient needs more extensive evaluation with possible CT scan to detect pyelonephritis or kidney stone or more worrisome etiology given patient's history.  Patient was advised to go to the hospital for more extensive evaluation.  Patient was agreeable with plan.  Vital signs stable at discharge.  Agreed patient self transport to the hospital. Final Clinical Impressions(s) / UC Diagnoses   Final diagnoses:  Acute left-sided low back pain without sciatica  Urinary frequency     Discharge Instructions      Please go to the emergency department as soon as you leave urgent care for further evaluation and management.     ED Prescriptions   None    PDMP not reviewed this encounter.   Teodora Medici, [place] 06/21/21 [phone]

## 2021-06-21 NOTE — ED Triage Notes (Signed)
3 day h/o left flank pain that has worsened since the onset. Denies hematuria and dysuria. Confirms urinary frequency and urgency. No odor or cloudiness in regards to urine. Has been taking ibuprofen without relief. No n/t in BLE.

## 2021-06-21 NOTE — Discharge Instructions (Signed)
Please go to the emergency department as soon as you leave urgent care for further evaluation and management. ?

## 2021-06-30 ENCOUNTER — Emergency Department (HOSPITAL_COMMUNITY)
Admission: EM | Admit: 2021-06-30 | Discharge: 2021-06-30 | Disposition: A | Discharge status: Home / Self Care | Payer: Medicaid Other | Attending: Emergency Medicine | Admitting: Emergency Medicine

## 2021-06-30 ENCOUNTER — Emergency Department (HOSPITAL_COMMUNITY): Payer: Medicaid Other

## 2021-06-30 ENCOUNTER — Other Ambulatory Visit: Payer: Self-pay

## 2021-06-30 DIAGNOSIS — R161 Splenomegaly, not elsewhere classified: Secondary | ICD-10-CM | POA: Diagnosis not present

## 2021-06-30 DIAGNOSIS — N9489 Other specified conditions associated with female genital organs and menstrual cycle: Secondary | ICD-10-CM | POA: Insufficient documentation

## 2021-06-30 DIAGNOSIS — D649 Anemia, unspecified: Secondary | ICD-10-CM | POA: Insufficient documentation

## 2021-06-30 DIAGNOSIS — N200 Calculus of kidney: Secondary | ICD-10-CM | POA: Diagnosis not present

## 2021-06-30 DIAGNOSIS — L03116 Cellulitis of left lower limb: Secondary | ICD-10-CM | POA: Diagnosis not present

## 2021-06-30 DIAGNOSIS — R109 Unspecified abdominal pain: Secondary | ICD-10-CM

## 2021-06-30 DIAGNOSIS — K760 Fatty (change of) liver, not elsewhere classified: Secondary | ICD-10-CM | POA: Diagnosis not present

## 2021-06-30 DIAGNOSIS — Z20822 Contact with and (suspected) exposure to covid-19: Secondary | ICD-10-CM | POA: Insufficient documentation

## 2021-06-30 DIAGNOSIS — M545 Low back pain, unspecified: Secondary | ICD-10-CM | POA: Diagnosis not present

## 2021-06-30 LAB — RESP PANEL BY RT-PCR (FLU A&B, COVID) ARPGX2
Influenza A by PCR: NEGATIVE
Influenza B by PCR: NEGATIVE
SARS Coronavirus 2 by RT PCR: NEGATIVE

## 2021-06-30 LAB — URINALYSIS, ROUTINE W REFLEX MICROSCOPIC
Bilirubin Urine: NEGATIVE
Glucose, UA: NEGATIVE mg/dL
Hgb urine dipstick: NEGATIVE
Ketones, ur: NEGATIVE mg/dL
Nitrite: NEGATIVE
Protein, ur: 30 mg/dL — AB
Specific Gravity, Urine: 1.024 (ref 1.005–1.030)
pH: 5 (ref 5.0–8.0)

## 2021-06-30 LAB — LIPASE, BLOOD: Lipase: 22 U/L (ref 11–51)

## 2021-06-30 LAB — DIFFERENTIAL
Abs Immature Granulocytes: 0.01 10*3/uL (ref 0.00–0.07)
Basophils Absolute: 0.1 10*3/uL (ref 0.0–0.1)
Basophils Relative: 1 %
Eosinophils Absolute: 0.2 10*3/uL (ref 0.0–0.5)
Eosinophils Relative: 2 %
Immature Granulocytes: 0 %
Lymphocytes Relative: 33 %
Lymphs Abs: 2.6 10*3/uL (ref 0.7–4.0)
Monocytes Absolute: 0.9 10*3/uL (ref 0.1–1.0)
Monocytes Relative: 12 %
Neutro Abs: 4 10*3/uL (ref 1.7–7.7)
Neutrophils Relative %: 52 %

## 2021-06-30 LAB — CBC
HCT: 30.8 % — ABNORMAL LOW (ref 36.0–46.0)
Hemoglobin: 9.6 g/dL — ABNORMAL LOW (ref 12.0–15.0)
MCH: 22.6 pg — ABNORMAL LOW (ref 26.0–34.0)
MCHC: 31.2 g/dL (ref 30.0–36.0)
MCV: 72.6 fL — ABNORMAL LOW (ref 80.0–100.0)
Platelets: 247 10*3/uL (ref 150–400)
RBC: 4.24 MIL/uL (ref 3.87–5.11)
RDW: 18.1 % — ABNORMAL HIGH (ref 11.5–15.5)
WBC: 7.8 10*3/uL (ref 4.0–10.5)
nRBC: 0 % (ref 0.0–0.2)

## 2021-06-30 LAB — BASIC METABOLIC PANEL
Anion gap: 7 (ref 5–15)
BUN: 5 mg/dL — ABNORMAL LOW (ref 6–20)
CO2: 27 mmol/L (ref 22–32)
Calcium: 9 mg/dL (ref 8.9–10.3)
Chloride: 103 mmol/L (ref 98–111)
Creatinine, Ser: 0.71 mg/dL (ref 0.44–1.00)
GFR, Estimated: >60 mL/min (ref >60)
Glucose, Bld: 109 mg/dL — ABNORMAL HIGH (ref 70–99)
Potassium: 3.5 mmol/L (ref 3.5–5.1)
Sodium: 137 mmol/L (ref 135–145)

## 2021-06-30 LAB — I-STAT BETA HCG BLOOD, ED (MC, WL, AP ONLY): I-stat hCG, quantitative: <5 m[IU]/mL (ref <5)

## 2021-06-30 IMAGING — CT CT RENAL STONE PROTOCOL
2 of 4 series · 16 of 46 positions shown, 18 images · non-contrast
Comparison: [DATE]

CLINICAL DATA: Left flank pain, low back pain



[Series 3: renal stone 5.0 · axial · 0.87mm/px · z∈[+678,+1138]mm · 13 of 102 slices shown, 15 images]
[im 5/102  soft-tissue]
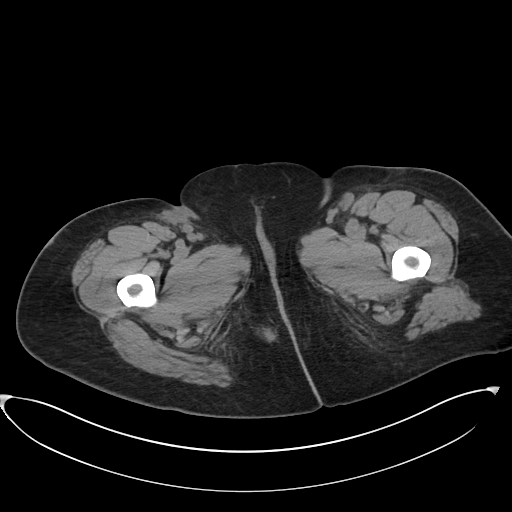
[im 5/102  bone]
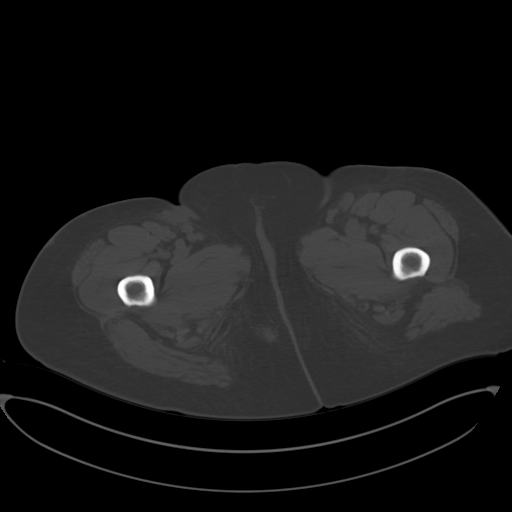
[im 13/102  soft-tissue]
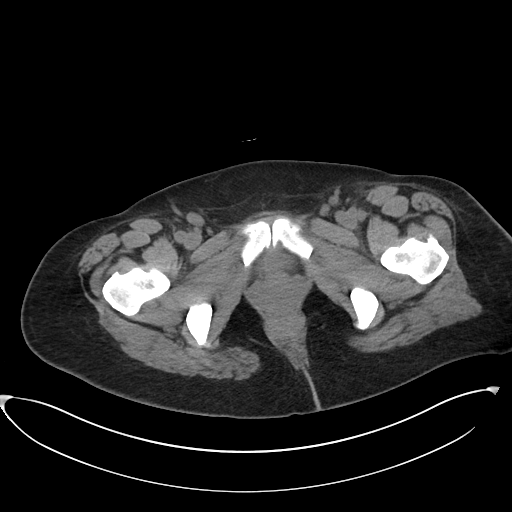
[im 22/102  soft-tissue]
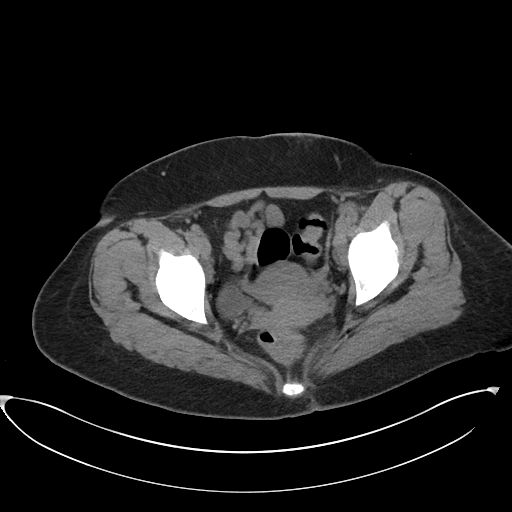
[im 30/102  soft-tissue]
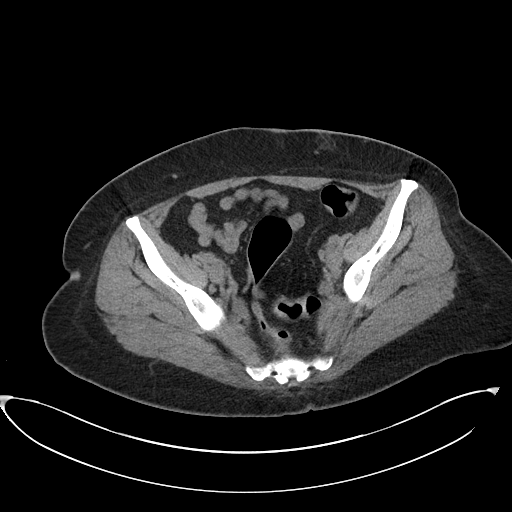
[im 34/102  soft-tissue]
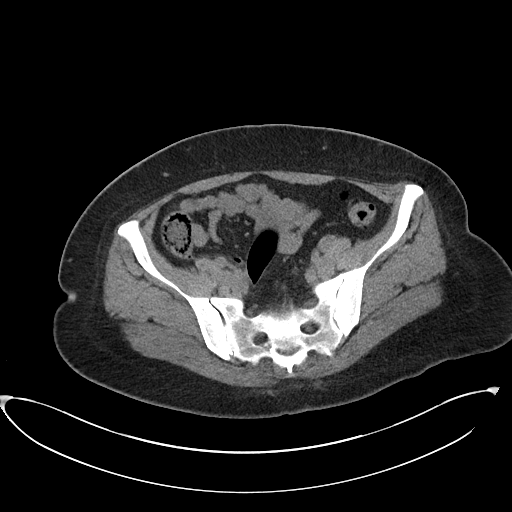
[im 43/102  soft-tissue]
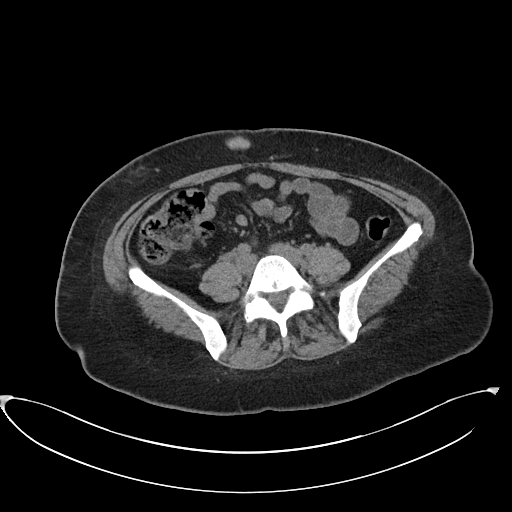
[im 51/102  soft-tissue]
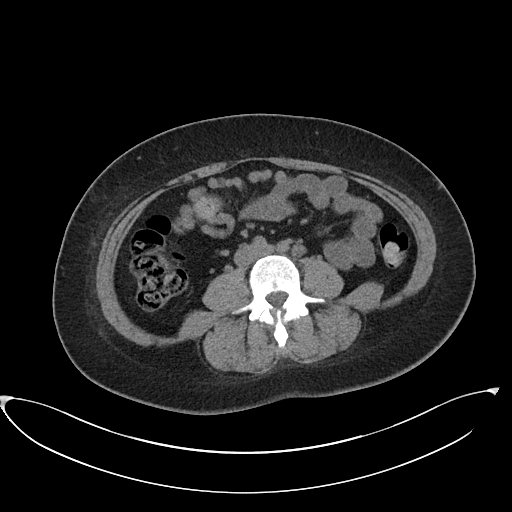
[im 59/102  soft-tissue]
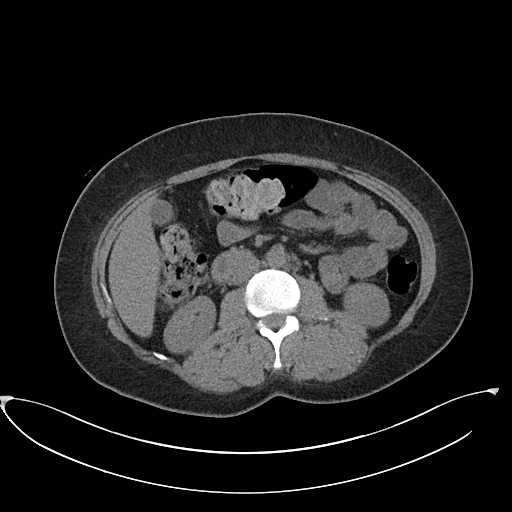
[im 68/102  soft-tissue]
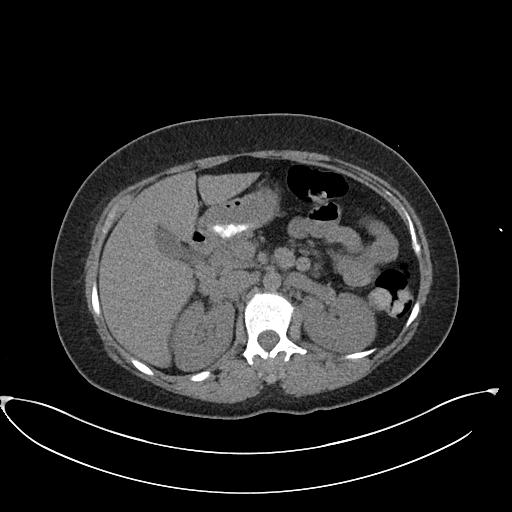
[im 68/102  bone]
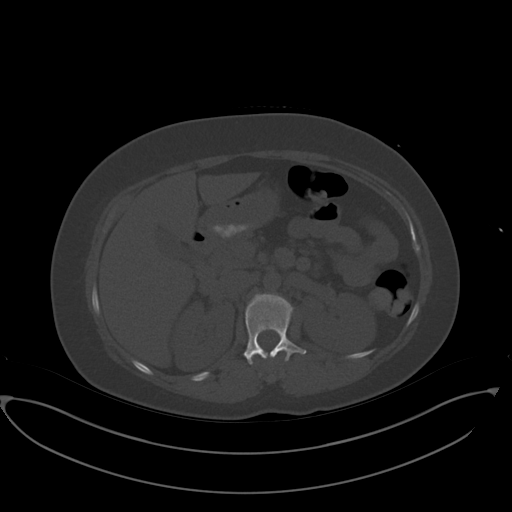
[im 72/102  soft-tissue]
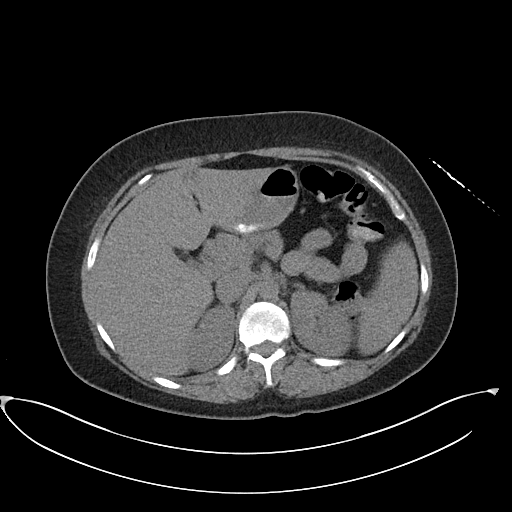
[im 80/102  soft-tissue]
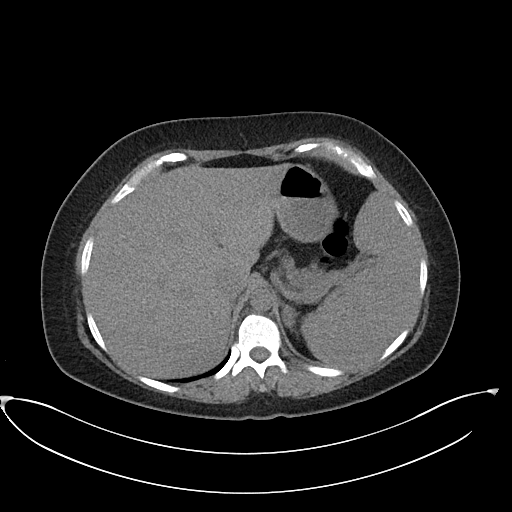
[im 89/102  soft-tissue]
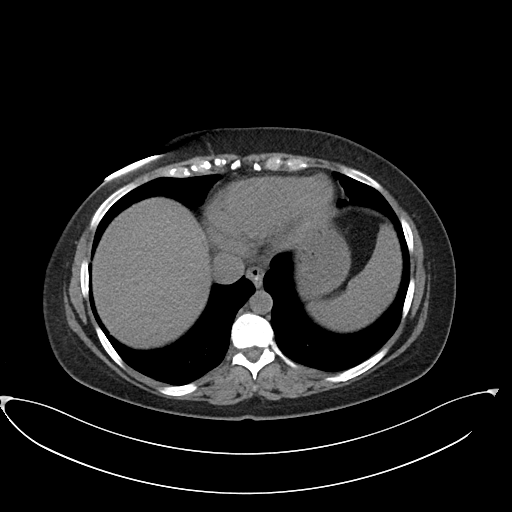
[im 97/102  soft-tissue]
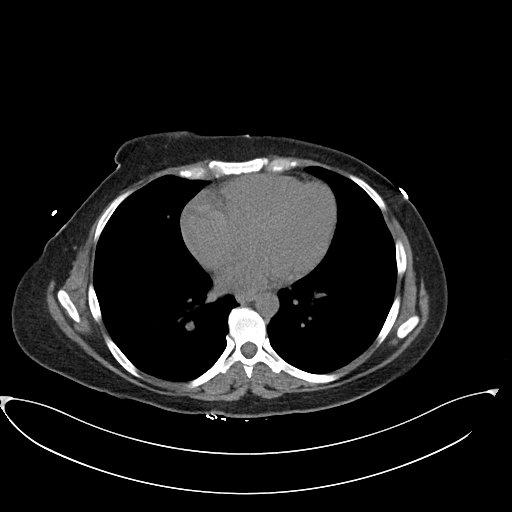

[Series 6: cor · coronal · 0.89mm/px · 3 of 156 slices shown]
[im 52/156  soft-tissue]
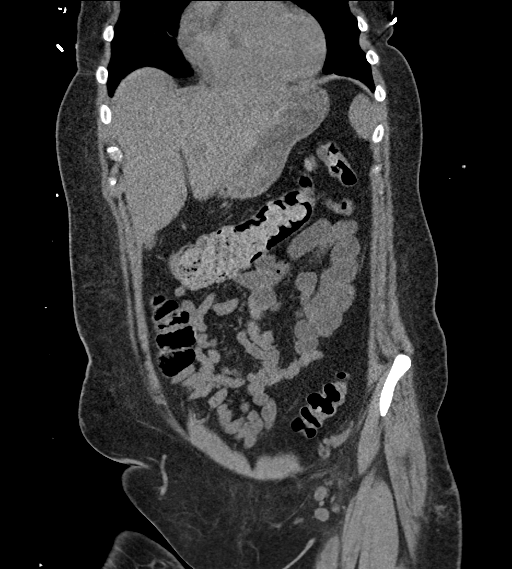
[im 69/156  soft-tissue]
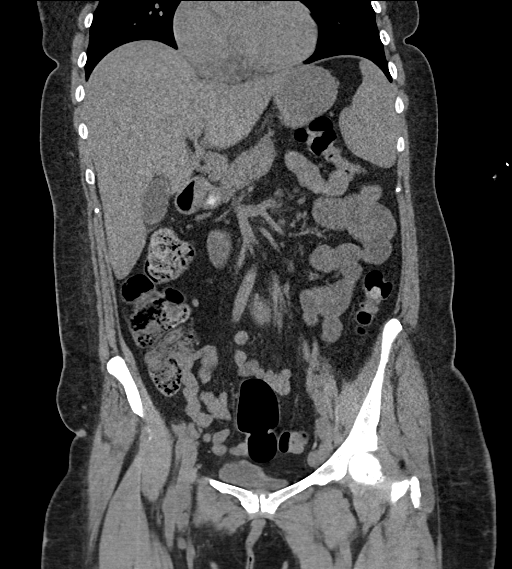
[im 87/156  soft-tissue]
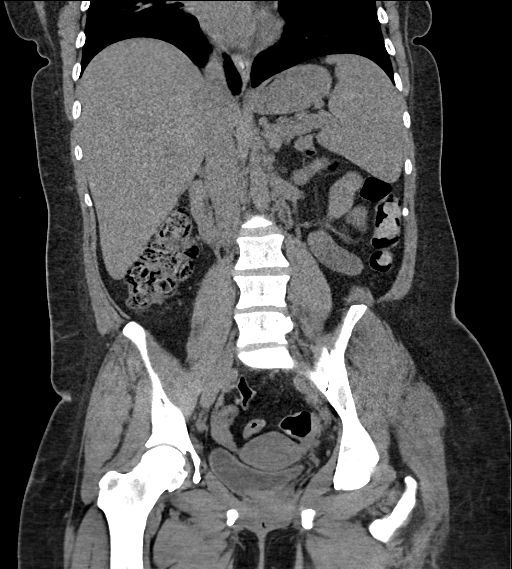

[16 of 46 positions shown; findings below may reference images not displayed]

FINDINGS: Lower chest: No acute abnormality.

Hepatobiliary: Focal fatty hepatic infiltration adjacent the
falciform ligament. Liver otherwise unremarkable. Gallbladder
unremarkable. No intra or extrahepatic biliary ductal dilation.

Pancreas: Unremarkable

Spleen: Mild splenomegaly is stable. No intra splenic mass seen on
this noncontrast examination.

Adrenals/Urinary Tract: 2 mm nonobstructing calculus noted within
the interpolar region of the left kidney, unchanged. No additional
renal or ureteral calculi. Kidneys are normal in size and position.
No hydronephrosis. Adrenal glands are unremarkable. Bladder is
unremarkable.

Stomach/Bowel: Stomach is within normal limits. Appendix appears
normal. No evidence of bowel wall thickening, distention, or
inflammatory changes.

Vascular/Lymphatic: The abdominal vasculature is unremarkable.
Shotty aortocaval, left periaortic, bilateral external iliac and
bilateral inguinal adenopathy appears stable. No frankly pathologic
adenopathy identified.

Reproductive: Uterus and bilateral adnexa are unremarkable.

Other: No abdominal wall hernia.

Musculoskeletal: No acute bone abnormality. No lytic or blastic bone
lesion.
IMPRESSION: No acute intra-abdominal pathology identified.

Stable splenomegaly and shotty retroperitoneal and pelvic
adenopathy. No frankly pathologic adenopathy identified within the
abdomen and pelvis.

Stable 2 mm nonobstructing left renal calculus.  No urolithiasis.

## 2021-06-30 MED ORDER — DOXYCYCLINE HYCLATE 100 MG PO CAPS: 100.0000 mg | ORAL_CAPSULE | Freq: Two times a day (BID) | ORAL | 0 refills | Status: AC

## 2021-06-30 MED ORDER — DOXYCYCLINE HYCLATE 100 MG PO CAPS: 100.0000 mg | ORAL_CAPSULE | Freq: Two times a day (BID) | ORAL | 0 refills | Status: DC

## 2021-06-30 NOTE — ED Notes (Signed)
ED Provider at bedside. 

## 2021-06-30 NOTE — Discharge Instructions (Signed)
Substance Abuse Treatment Programs ° °Intensive Outpatient Programs °[facility]     °[address]      °[place], [place]                   °[phone]      ° °[facility] °[address] °[place], [place] °[phone] ° °[facility]     °(Inpatient and outpatient)     °[address]           °[phone]   ° °[facility] °[phone] (Suboxone and Methadone) ° °[address]      °[place], [place] [address]      °[phone]      ° °[address] °[place], [place] °852-3033 ° °[facility] (Outpatient/Inpatient, Chemical)    °(insurance only) [phone]      °       °[facility] (Groups & Residential) °[place], [place] °[phone] ° °   °[facility]     °[address]     °[place], [place]      °[phone]      ° °[facility] (for caregivers and family) °[address] °[place], [place] °336-299-4655 ° ° ° ° ° °Residential Treatment Programs °[facility]      °[address], [place], [place] [address]  °([phone]      ° °[facility] °[address] [place], [place] [address] °[phone] ° °[facility]        °336-248-8914      ° °[facility] °[phone] ° °[facility]             °[address]                                         °[place], [place]                                                °877-615-2722 or 336-784-9470                              ° °[facility] [place] °[address] °[place], [address] °[phone] ° °[facility]    °[address]      °[place], [place]     °[phone]      ° °[facility] °[address] °[place], [place] °[phone] ° °[facility]   °[address]     °[place], [place] [address]     °[phone]      °Admissions: 8am-3pm M-F ° °[facility] °[address] °[place],  [place] °[phone] ° °[facility]: Residential Program (90 Days)   °[place], [place]      °336-725-8389 or [phone]    ° °[facility] °[place],  °(Walk in Hours over the weekend or by referral) ° °[facility] °[address], [place],  [address] °([phone] ° °Crisis Mobile: [facility]:  1-877-626-1772 (for crisis response 24 hours a day) °[facility] Hotline:      1-800-256-2452 °Outpatient Psychiatry and Counseling ° °[facility]: Mobile Crisis   Management 24 hours:  1-877-626-1772 ° °[facility] sliding scale fee and walk in schedule: M-F 8am-12pm/1pm-3pm °[address]  °[place], [place] [address] °[phone] ° °[facility] °[address] °[place], [place] [address] °[phone] ° °[facility] (Former[facility] known [facility])- new patient walk-in appointments available Monday - Friday 8am -3pm.          °[address] °[place], [place] [address] °336-676-6840 or crisis line- 336-676-6905 ° °[facility]/ Intensive Outpatient Therapy Program °[address] °[place], [place] [address] °336-832-9804 ° °[facility]                  °Crisis Services      °[phone]      °[address]     °[place], [place] [address]                ° °[facility]   °[facility] °[phone] °[address] °[place], [place] [address] ° ° °[facility]          °[address]  °[place], [place] [address]       °[phone] ° °[facility] °[address] °[place], [place] [address] °336-292-1510 ° °[facility]    °[address]    °[place], [place] [address]     °[phone]      ° °Parish McKinney, MD     °[address]     °[place] [address]     °336-282-1251     °  °[facility] °[address] °[place] [address] ° °[facility]     °[address]     °[place], [place]      °336-542-2076      ° °[facility] °Shamsher Ahluwalia, MD °[address] °[place], [place] [address] °[phone] ° °[facility]     °[address]     °[place], [place] [address]     °336-274-1237      ° °[facility] °[address] °[place], [place] [address] °336-854-4450 °Resources for Temporary Residential Assistance/Crisis Centers ° °DAY CENTERS °[facility] °M-F 8am-3pm   °[address] [place], [place] [address]   336-332-0824 °Services include: laundry, barbering, support groups, case management, phone  & computer access, showers, AA/NA mtgs, mental health/substance abuse nurse, job skills class, disability information, VA assistance, spiritual classes, etc.  ° °HOMELESS SHELTERS ° °[facility]     °[facility]   °[address], [place]     °[phone]       °       °[facility] (women and children)       °[address] °[place], [place] [address] °336-275-0820 °[email or web address] for application and process °Application Required ° °[facility]   °[address]    °[place] [address]     °[phone]       °             °[facility] °[address] °[place], [place] [address] °336.273.5572 °336-235-0363(schedule application appt.) °Application Required ° °[facility] (women only)    °[address]     °[place], [place] [address]     °336-884-1039      °  Intake starts 6pm daily °Need valid ID, SSC, & Police report °[facility] [place] °[address] °[place], [place] °336-881-5420 °Application Required ° °[facility] (men only)     °[address]      °[place], [place]     °[phone]      ° °[facility] °(Pregnant women only) °[address] °[place], [place] °336-275-0206 ° °[facility]      °[address]      °[place], [place] [address]     °[phone]      °       °[facility] °[address] °[place], [place] °336-723-1848 °90 day commitment/SA/Application process ° °[facility](men only)     °[address]     °[place], [place]     °[phone]       °Check-in at 7pm     °       °[facility] °[address] °[place], [place] [address] °336-248-6684 °Men/Women/Women and Children must be there by 7 pm ° °[facility] °[place],  °336-722-8721                ° °

## 2021-06-30 NOTE — ED Notes (Signed)
Pt to xray

## 2021-06-30 NOTE — ED Triage Notes (Signed)
Pt arrives to ED POV c/o lower left back pain x5 days. Denies any problem urinary problems. Pt has been takig ibuprofen without relief. Pt a/o x4.

## 2021-06-30 NOTE — ED Provider Notes (Signed)
Emergency Department Provider Note   I have reviewed the triage vital signs and the nursing notes.   HISTORY  Chief Complaint Flank Pain   HPI Latoya [name] is a 36 y.o. female with PMH of IVDA presents to the ED with left flank pain intermittently for the last 5 days.  Denies any midline back pain.  No fevers or chills.  No numbness or weakness in the extremities.  She is not having any urinary tract infection symptoms.  She states initially she thought she may have slept wrong or twisted something.  Has been taking ibuprofen without relief in symptoms.  She is not feeling short of breath or having chest discomfort.  No productive cough.  No vaginal bleeding or discharge.    Past Medical History:  Diagnosis Date   Heroin addiction (HCC)    IV drug abuse (HCC)     Review of Systems  Constitutional: No fever/chills Eyes: No visual changes. ENT: No sore throat. Cardiovascular: Denies chest pain. Respiratory: Denies shortness of breath. Gastrointestinal: Positive left flank/abdominal pain.  No nausea, no vomiting.  No diarrhea.  No constipation. Genitourinary: Negative for dysuria. Musculoskeletal: Negative for back pain. Skin: Positive rash to the legs.  Neurological: Negative for headaches, focal weakness or numbness.   ____________________________________________   PHYSICAL EXAM:  VITAL SIGNS: ED Triage Vitals  Enc Vitals Group     BP 06/30/21 0129 122/87     Pulse Rate 06/30/21 0129 (!) 104     Resp 06/30/21 0129 18     Temp 06/30/21 0129 97.7 F (36.5 C)     Temp Source 06/30/21 0129 Oral     SpO2 06/30/21 0129 100 %     Weight 06/30/21 0143 150 lb (68 kg)     Height 06/30/21 0143 5\' 6"  (1.676 m)    Constitutional: Alert and oriented. Well appearing and in no acute distress. Eyes: Conjunctivae are normal.  Head: Atraumatic. Nose: No congestion/rhinnorhea. Mouth/Throat: Mucous membranes are moist.  Neck: No stridor.   Cardiovascular: Normal rate,  regular rhythm. Good peripheral circulation. Grossly normal heart sounds.   Respiratory: Normal respiratory effort.  No retractions. Lungs CTAB. Gastrointestinal: Soft and nontender. No distention.  Musculoskeletal: No lower extremity tenderness nor edema. No gross deformities of extremities. Neurologic:  Normal speech and language. No gross focal neurologic deficits are appreciated.  Skin:  Skin is warm and dry. Legs are edematous bilaterally with mild erythema and track marks.    ____________________________________________   LABS (all labs ordered are listed, but only abnormal results are displayed)  Labs Reviewed  URINALYSIS, ROUTINE W REFLEX MICROSCOPIC - Abnormal; Notable for the following components:      Result Value   Color, Urine AMBER (*)    APPearance HAZY (*)    Protein, ur 30 (*)    Leukocytes,Ua TRACE (*)    Bacteria, UA RARE (*)    All other components within normal limits  BASIC METABOLIC PANEL - Abnormal; Notable for the following components:   Glucose, Bld 109 (*)    BUN 5 (*)    All other components within normal limits  CBC - Abnormal; Notable for the following components:   Hemoglobin 9.6 (*)    HCT 30.8 (*)    MCV 72.6 (*)    MCH 22.6 (*)    RDW 18.1 (*)    All other components within normal limits  RESP PANEL BY RT-PCR (FLU A&B, COVID) ARPGX2  DIFFERENTIAL  LIPASE, BLOOD  I-STAT BETA HCG BLOOD,  ED ([facility], [facility], [facility] ONLY)   ____________________________________________  RADIOLOGY  CT Renal Stone Study  Result Date: 06/30/2021 CLINICAL DATA:  Left flank pain, low back pain EXAM: CT ABDOMEN AND PELVIS WITHOUT CONTRAST TECHNIQUE: Multidetector CT imaging of the abdomen and pelvis was performed following the standard protocol without IV contrast. RADIATION DOSE REDUCTION: This exam was performed according to the departmental dose-optimization program which includes automated exposure control, adjustment of the mA and/or kV according to patient size and/or use  of iterative reconstruction technique. COMPARISON:  02/05/2019 FINDINGS: Lower chest: No acute abnormality. Hepatobiliary: Focal fatty hepatic infiltration adjacent the falciform ligament. Liver otherwise unremarkable. Gallbladder unremarkable. No intra or extrahepatic biliary ductal dilation. Pancreas: Unremarkable Spleen: Mild splenomegaly is stable. No intra splenic mass seen on this noncontrast examination. Adrenals/Urinary Tract: 2 mm nonobstructing calculus noted within the interpolar region of the left kidney, unchanged. No additional renal or ureteral calculi. Kidneys are normal in size and position. No hydronephrosis. Adrenal glands are unremarkable. Bladder is unremarkable. Stomach/Bowel: Stomach is within normal limits. Appendix appears normal. No evidence of bowel wall thickening, distention, or inflammatory changes. Vascular/Lymphatic: The abdominal vasculature is unremarkable. Shotty aortocaval, left periaortic, bilateral external iliac and bilateral inguinal adenopathy appears stable. No frankly pathologic adenopathy identified. Reproductive: Uterus and bilateral adnexa are unremarkable. Other: No abdominal wall hernia. Musculoskeletal: No acute bone abnormality. No lytic or blastic bone lesion. IMPRESSION: No acute intra-abdominal pathology identified. Stable splenomegaly and shotty retroperitoneal and pelvic adenopathy. No frankly pathologic adenopathy identified within the abdomen and pelvis. Stable 2 mm nonobstructing left renal calculus.  No urolithiasis. Electronically Signed   By: Helyn Numbers M.D.   On: 06/30/2021 02:57    ____________________________________________   PROCEDURES  Procedure(s) performed:   Procedures  None  ____________________________________________   INITIAL IMPRESSION / ASSESSMENT AND PLAN / ED COURSE  Pertinent labs & imaging results that were available during my care of the patient were reviewed by me and considered in my medical decision making (see  chart for details).   This patient is Presenting for Evaluation of abdominal pain, which does require a range of treatment options, and is a complaint that involves a high risk of morbidity and mortality.  The Differential Diagnoses includes but is not exclusive to acute cholecystitis, intrathoracic causes for epigastric abdominal pain, gastritis, duodenitis, pancreatitis, small bowel or large bowel obstruction, abdominal aortic aneurysm, hernia, gastritis, etc.   I decided to review pertinent External Data, and in summary patient seen at [facility] on [facility]/15 with similar symptoms. Advised to seek ED evaluation at that time but declined.    Clinical Laboratory Tests Ordered, included CBC without leukocytosis.  Mild anemia.  Basic metabolic panel within normal limits.  Lipase negative. Pregnancy negative.   Radiologic Tests Ordered, included CT renal. I independently interpreted the images and agree with radiology interpretation.   Cardiac Monitor Tracing which shows NSR.   Social Determinants of Health Risk patient with active history of IVDA.  Medical Decision Making: Summary:  Patient presents emergency department left flank pain.  Seems musculoskeletal based on exam.  No overlying rash.  Patient is afebrile with no leukocytosis.  No altered mental status.  No history features or exam findings to strongly suspect discitis/osteomyelitis.  Do not see indication for emergent MRI based on exam findings.  CT imaging shows baseline splenomegaly and no other acute findings.   Reevaluation with update and discussion with patient. Will cover with Doxy for leg cellulitis. Exam not consistent with necrotizing fascitis. Also provided list  of area substance use resources.   Disposition: discharge  ____________________________________________  FINAL CLINICAL IMPRESSION(S) / ED DIAGNOSES  Final diagnoses:  Flank pain  Left leg cellulitis     NEW OUTPATIENT MEDICATIONS STARTED DURING THIS  VISIT:  Current Discharge Medication List     START taking these medications   Details  doxycycline (VIBRAMYCIN) 100 MG capsule Take 1 capsule (100 mg total) by mouth 2 (two) times daily for 7 days. Qty: 14 capsule, Refills: 0        Note:  This document was prepared using Dragon voice recognition software and may include unintentional dictation errors.  Alona Bene, MD, [facility]    Camile Esters, Arlyss Repress, MD 06/30/21 [phone]

## 2021-07-05 DIAGNOSIS — Z419 Encounter for procedure for purposes other than remedying health state, unspecified: Secondary | ICD-10-CM | POA: Diagnosis not present

## 2021-08-02 ENCOUNTER — Inpatient Hospital Stay (HOSPITAL_COMMUNITY)
Admission: EM | Admit: 2021-08-02 | Discharge: 2021-09-17 | DRG: 871 | Disposition: A | Discharge status: Home / Self Care | Payer: Medicaid Other | Attending: Internal Medicine | Admitting: Internal Medicine

## 2021-08-02 ENCOUNTER — Other Ambulatory Visit: Payer: Self-pay

## 2021-08-02 ENCOUNTER — Emergency Department (HOSPITAL_COMMUNITY): Payer: Medicaid Other

## 2021-08-02 ENCOUNTER — Encounter (HOSPITAL_COMMUNITY): Payer: Self-pay

## 2021-08-02 DIAGNOSIS — R079 Chest pain, unspecified: Secondary | ICD-10-CM | POA: Diagnosis not present

## 2021-08-02 DIAGNOSIS — F1910 Other psychoactive substance abuse, uncomplicated: Secondary | ICD-10-CM | POA: Diagnosis present

## 2021-08-02 DIAGNOSIS — D72828 Other elevated white blood cell count: Secondary | ICD-10-CM

## 2021-08-02 DIAGNOSIS — I079 Rheumatic tricuspid valve disease, unspecified: Secondary | ICD-10-CM | POA: Diagnosis present

## 2021-08-02 DIAGNOSIS — D638 Anemia in other chronic diseases classified elsewhere: Secondary | ICD-10-CM | POA: Diagnosis not present

## 2021-08-02 DIAGNOSIS — A419 Sepsis, unspecified organism: Secondary | ICD-10-CM

## 2021-08-02 DIAGNOSIS — Z20822 Contact with and (suspected) exposure to covid-19: Secondary | ICD-10-CM | POA: Diagnosis not present

## 2021-08-02 DIAGNOSIS — Q2112 Patent foramen ovale: Secondary | ICD-10-CM | POA: Diagnosis not present

## 2021-08-02 DIAGNOSIS — R6520 Severe sepsis without septic shock: Secondary | ICD-10-CM | POA: Diagnosis present

## 2021-08-02 DIAGNOSIS — F1190 Opioid use, unspecified, uncomplicated: Secondary | ICD-10-CM | POA: Diagnosis present

## 2021-08-02 DIAGNOSIS — D509 Iron deficiency anemia, unspecified: Secondary | ICD-10-CM | POA: Diagnosis present

## 2021-08-02 DIAGNOSIS — G479 Sleep disorder, unspecified: Secondary | ICD-10-CM | POA: Diagnosis not present

## 2021-08-02 DIAGNOSIS — F419 Anxiety disorder, unspecified: Secondary | ICD-10-CM | POA: Diagnosis present

## 2021-08-02 DIAGNOSIS — G894 Chronic pain syndrome: Secondary | ICD-10-CM | POA: Diagnosis present

## 2021-08-02 DIAGNOSIS — Z5320 Procedure and treatment not carried out because of patient's decision for unspecified reasons: Secondary | ICD-10-CM | POA: Diagnosis not present

## 2021-08-02 DIAGNOSIS — I2699 Other pulmonary embolism without acute cor pulmonale: Secondary | ICD-10-CM | POA: Diagnosis not present

## 2021-08-02 DIAGNOSIS — B999 Unspecified infectious disease: Secondary | ICD-10-CM | POA: Diagnosis present

## 2021-08-02 DIAGNOSIS — R092 Respiratory arrest: Secondary | ICD-10-CM | POA: Diagnosis not present

## 2021-08-02 DIAGNOSIS — Z954 Presence of other heart-valve replacement: Secondary | ICD-10-CM | POA: Diagnosis not present

## 2021-08-02 DIAGNOSIS — J189 Pneumonia, unspecified organism: Secondary | ICD-10-CM | POA: Diagnosis not present

## 2021-08-02 DIAGNOSIS — E860 Dehydration: Secondary | ICD-10-CM | POA: Diagnosis not present

## 2021-08-02 DIAGNOSIS — R008 Other abnormalities of heart beat: Secondary | ICD-10-CM | POA: Diagnosis not present

## 2021-08-02 DIAGNOSIS — I2609 Other pulmonary embolism with acute cor pulmonale: Secondary | ICD-10-CM | POA: Diagnosis not present

## 2021-08-02 DIAGNOSIS — M545 Low back pain, unspecified: Secondary | ICD-10-CM | POA: Diagnosis present

## 2021-08-02 DIAGNOSIS — R739 Hyperglycemia, unspecified: Secondary | ICD-10-CM | POA: Diagnosis not present

## 2021-08-02 DIAGNOSIS — A4102 Sepsis due to Methicillin resistant Staphylococcus aureus: Secondary | ICD-10-CM | POA: Diagnosis not present

## 2021-08-02 DIAGNOSIS — B9562 Methicillin resistant Staphylococcus aureus infection as the cause of diseases classified elsewhere: Secondary | ICD-10-CM | POA: Diagnosis not present

## 2021-08-02 DIAGNOSIS — M549 Dorsalgia, unspecified: Secondary | ICD-10-CM | POA: Diagnosis not present

## 2021-08-02 DIAGNOSIS — F17210 Nicotine dependence, cigarettes, uncomplicated: Secondary | ICD-10-CM | POA: Diagnosis present

## 2021-08-02 DIAGNOSIS — R7881 Bacteremia: Secondary | ICD-10-CM

## 2021-08-02 DIAGNOSIS — I330 Acute and subacute infective endocarditis: Secondary | ICD-10-CM | POA: Diagnosis not present

## 2021-08-02 DIAGNOSIS — I2690 Septic pulmonary embolism without acute cor pulmonale: Secondary | ICD-10-CM | POA: Diagnosis not present

## 2021-08-02 DIAGNOSIS — R7989 Other specified abnormal findings of blood chemistry: Secondary | ICD-10-CM | POA: Diagnosis present

## 2021-08-02 DIAGNOSIS — R5381 Other malaise: Secondary | ICD-10-CM | POA: Diagnosis present

## 2021-08-02 DIAGNOSIS — I071 Rheumatic tricuspid insufficiency: Secondary | ICD-10-CM | POA: Diagnosis present

## 2021-08-02 DIAGNOSIS — D72829 Elevated white blood cell count, unspecified: Secondary | ICD-10-CM

## 2021-08-02 DIAGNOSIS — I350 Nonrheumatic aortic (valve) stenosis: Secondary | ICD-10-CM | POA: Diagnosis not present

## 2021-08-02 DIAGNOSIS — R262 Difficulty in walking, not elsewhere classified: Secondary | ICD-10-CM

## 2021-08-02 DIAGNOSIS — F1123 Opioid dependence with withdrawal: Secondary | ICD-10-CM | POA: Diagnosis not present

## 2021-08-02 DIAGNOSIS — I361 Nonrheumatic tricuspid (valve) insufficiency: Secondary | ICD-10-CM | POA: Diagnosis not present

## 2021-08-02 DIAGNOSIS — J9811 Atelectasis: Secondary | ICD-10-CM | POA: Diagnosis present

## 2021-08-02 DIAGNOSIS — E871 Hypo-osmolality and hyponatremia: Secondary | ICD-10-CM | POA: Diagnosis not present

## 2021-08-02 DIAGNOSIS — R918 Other nonspecific abnormal finding of lung field: Secondary | ICD-10-CM | POA: Diagnosis not present

## 2021-08-02 DIAGNOSIS — I38 Endocarditis, valve unspecified: Secondary | ICD-10-CM | POA: Diagnosis not present

## 2021-08-02 DIAGNOSIS — B852 Pediculosis, unspecified: Secondary | ICD-10-CM | POA: Diagnosis not present

## 2021-08-02 DIAGNOSIS — Z743 Need for continuous supervision: Secondary | ICD-10-CM | POA: Diagnosis not present

## 2021-08-02 DIAGNOSIS — R269 Unspecified abnormalities of gait and mobility: Secondary | ICD-10-CM | POA: Diagnosis present

## 2021-08-02 DIAGNOSIS — L989 Disorder of the skin and subcutaneous tissue, unspecified: Secondary | ICD-10-CM | POA: Diagnosis not present

## 2021-08-02 DIAGNOSIS — G8929 Other chronic pain: Secondary | ICD-10-CM

## 2021-08-02 DIAGNOSIS — Z538 Procedure and treatment not carried out for other reasons: Secondary | ICD-10-CM | POA: Diagnosis not present

## 2021-08-02 DIAGNOSIS — M1990 Unspecified osteoarthritis, unspecified site: Secondary | ICD-10-CM | POA: Diagnosis not present

## 2021-08-02 DIAGNOSIS — Z8679 Personal history of other diseases of the circulatory system: Secondary | ICD-10-CM

## 2021-08-02 DIAGNOSIS — E876 Hypokalemia: Secondary | ICD-10-CM | POA: Diagnosis not present

## 2021-08-02 DIAGNOSIS — O0383 Metabolic disorder following complete or unspecified spontaneous abortion: Secondary | ICD-10-CM | POA: Diagnosis present

## 2021-08-02 DIAGNOSIS — Z419 Encounter for procedure for purposes other than remedying health state, unspecified: Secondary | ICD-10-CM | POA: Diagnosis not present

## 2021-08-02 DIAGNOSIS — G9341 Metabolic encephalopathy: Secondary | ICD-10-CM | POA: Diagnosis not present

## 2021-08-02 DIAGNOSIS — Z8619 Personal history of other infectious and parasitic diseases: Secondary | ICD-10-CM

## 2021-08-02 DIAGNOSIS — D649 Anemia, unspecified: Secondary | ICD-10-CM | POA: Diagnosis not present

## 2021-08-02 DIAGNOSIS — I339 Acute and subacute endocarditis, unspecified: Secondary | ICD-10-CM | POA: Diagnosis not present

## 2021-08-02 DIAGNOSIS — Z888 Allergy status to other drugs, medicaments and biological substances status: Secondary | ICD-10-CM

## 2021-08-02 DIAGNOSIS — Z8614 Personal history of Methicillin resistant Staphylococcus aureus infection: Secondary | ICD-10-CM

## 2021-08-02 DIAGNOSIS — R230 Cyanosis: Secondary | ICD-10-CM | POA: Diagnosis not present

## 2021-08-02 HISTORY — DX: Personal history of other diseases of the circulatory system: Z86.79

## 2021-08-02 LAB — URINALYSIS, ROUTINE W REFLEX MICROSCOPIC
Bilirubin Urine: NEGATIVE
Glucose, UA: NEGATIVE mg/dL
Ketones, ur: NEGATIVE mg/dL
Nitrite: NEGATIVE
Protein, ur: 30 mg/dL — AB
Specific Gravity, Urine: 1.012 (ref 1.005–1.030)
pH: 6 (ref 5.0–8.0)

## 2021-08-02 LAB — CBC WITH DIFFERENTIAL/PLATELET
Abs Immature Granulocytes: 0.87 10*3/uL — ABNORMAL HIGH (ref 0.00–0.07)
Basophils Absolute: 0.1 10*3/uL (ref 0.0–0.1)
Basophils Relative: 0 %
Eosinophils Absolute: 0 10*3/uL (ref 0.0–0.5)
Eosinophils Relative: 0 %
HCT: 30.8 % — ABNORMAL LOW (ref 36.0–46.0)
Hemoglobin: 10.4 g/dL — ABNORMAL LOW (ref 12.0–15.0)
Immature Granulocytes: 3 %
Lymphocytes Relative: 6 %
Lymphs Abs: 1.8 10*3/uL (ref 0.7–4.0)
MCH: 23.4 pg — ABNORMAL LOW (ref 26.0–34.0)
MCHC: 33.8 g/dL (ref 30.0–36.0)
MCV: 69.2 fL — ABNORMAL LOW (ref 80.0–100.0)
Monocytes Absolute: 1.4 10*3/uL — ABNORMAL HIGH (ref 0.1–1.0)
Monocytes Relative: 4 %
Neutro Abs: 27.6 10*3/uL — ABNORMAL HIGH (ref 1.7–7.7)
Neutrophils Relative %: 87 %
Platelets: 199 10*3/uL (ref 150–400)
RBC: 4.45 MIL/uL (ref 3.87–5.11)
RDW: 19.8 % — ABNORMAL HIGH (ref 11.5–15.5)
WBC: 31.7 10*3/uL — ABNORMAL HIGH (ref 4.0–10.5)
nRBC: 0 % (ref 0.0–0.2)

## 2021-08-02 LAB — COMPREHENSIVE METABOLIC PANEL
ALT: 19 U/L (ref 0–44)
AST: 53 U/L — ABNORMAL HIGH (ref 15–41)
Albumin: 2.7 g/dL — ABNORMAL LOW (ref 3.5–5.0)
Alkaline Phosphatase: 77 U/L (ref 38–126)
Anion gap: 13 (ref 5–15)
BUN: 25 mg/dL — ABNORMAL HIGH (ref 6–20)
CO2: 21 mmol/L — ABNORMAL LOW (ref 22–32)
Calcium: 7.8 mg/dL — ABNORMAL LOW (ref 8.9–10.3)
Chloride: 93 mmol/L — ABNORMAL LOW (ref 98–111)
Creatinine, Ser: 0.97 mg/dL (ref 0.44–1.00)
GFR, Estimated: >60 mL/min (ref >60)
Glucose, Bld: 108 mg/dL — ABNORMAL HIGH (ref 70–99)
Potassium: 2.4 mmol/L — CL (ref 3.5–5.1)
Sodium: 127 mmol/L — ABNORMAL LOW (ref 135–145)
Total Bilirubin: 0.8 mg/dL (ref 0.3–1.2)
Total Protein: 8.2 g/dL — ABNORMAL HIGH (ref 6.5–8.1)

## 2021-08-02 LAB — I-STAT BETA HCG BLOOD, ED (MC, WL, AP ONLY): I-stat hCG, quantitative: 12.4 m[IU]/mL — ABNORMAL HIGH (ref <5)

## 2021-08-02 LAB — RESP PANEL BY RT-PCR (FLU A&B, COVID) ARPGX2
Influenza A by PCR: NEGATIVE
Influenza B by PCR: NEGATIVE
SARS Coronavirus 2 by RT PCR: NEGATIVE

## 2021-08-02 LAB — HCG, QUANTITATIVE, PREGNANCY: hCG, Beta Chain, Quant, S: 1 m[IU]/mL (ref <5)

## 2021-08-02 LAB — LACTIC ACID, PLASMA
Lactic Acid, Venous: 2.2 mmol/L (ref 0.5–1.9)
Lactic Acid, Venous: 1.8 mmol/L (ref 0.5–1.9)

## 2021-08-02 LAB — MAGNESIUM: Magnesium: 2.1 mg/dL (ref 1.7–2.4)

## 2021-08-02 LAB — C-REACTIVE PROTEIN: CRP: 21.3 mg/dL — ABNORMAL HIGH (ref <1.0)

## 2021-08-02 LAB — SEDIMENTATION RATE: Sed Rate: 86 mm/hr — ABNORMAL HIGH (ref 0–22)

## 2021-08-02 IMAGING — MR MR LUMBAR SPINE WO/W CM
6 of 7 series · 31 of 48 positions shown · IV contrast (gadavist)
Comparison: MRI [DATE]

CLINICAL DATA: Low back pain. Infection suspected. Rule out
discitis or abscess.

EXAM:
MRI LUMBAR SPINE WITHOUT AND WITH CONTRAST
TECHNIQUE: Multiplanar and multiecho pulse sequences of the lumbar spine were
obtained without and with intravenous contrast.
CONTRAST:  7mL GADAVIST GADOBUTROL 1 MMOL/ML IV SOLN

[Series 9: T1 · sagittal · 4.0mm · 0.81mm/px · 5 of 17 slices shown (1 of 2)]
[im 1/17]
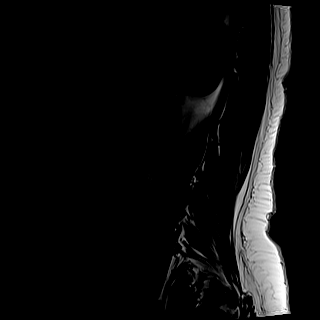
[im 5/17]
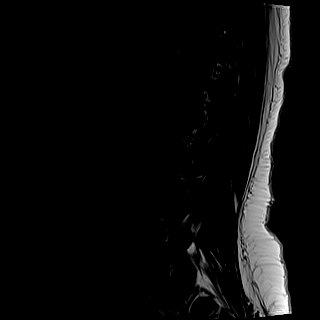
[im 9/17]
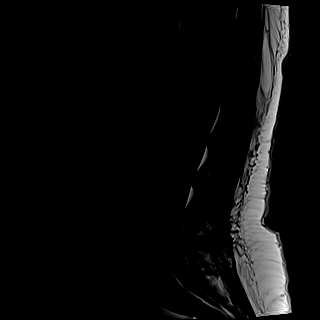
[im 13/17]
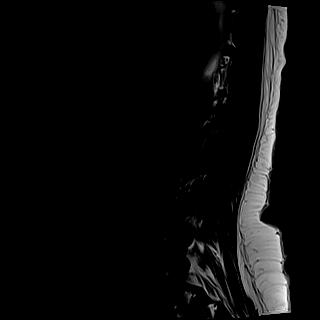
[im 17/17]
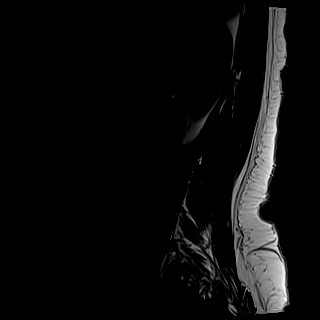

[Series 10: STIR · sagittal · 4.0mm · 0.51mm/px · 2 of 17 slices shown]
[im 1/17]
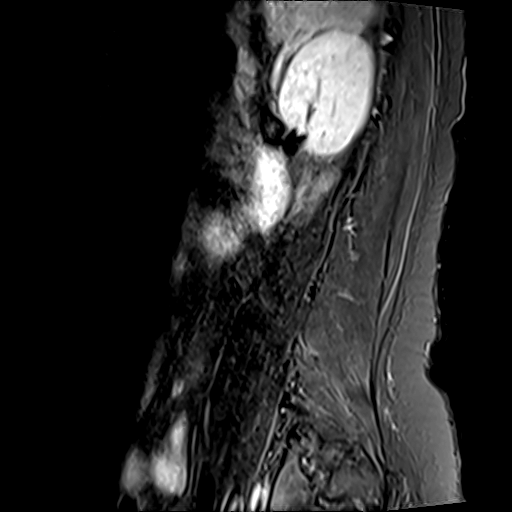
[im 5/17]
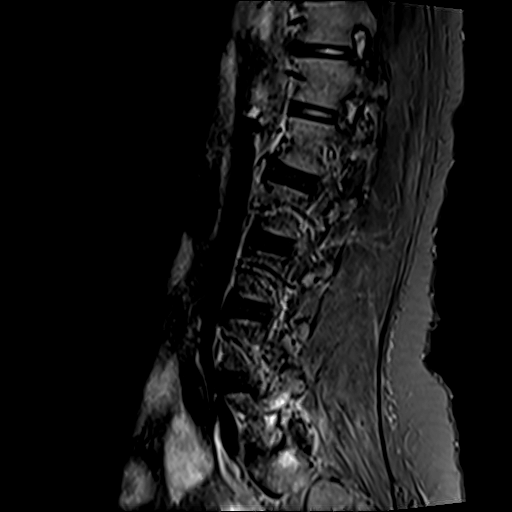

[Series 11: T2 · axial · 4.0mm · 0.62mm/px · z∈[-76,+147]mm · 8 of 43 slices shown]
[im 1/43]
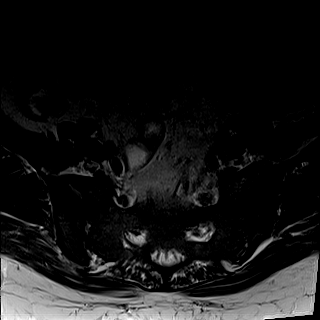
[im 5/43]
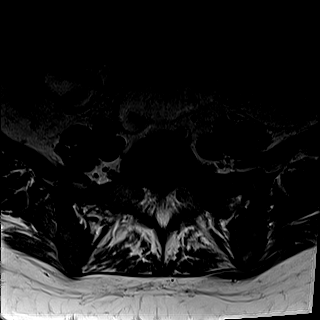
[im 15/43]
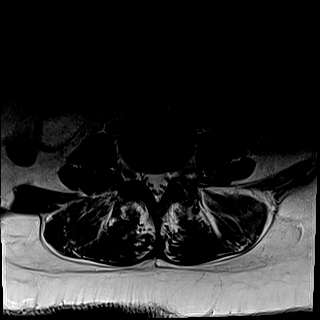
[im 19/43]
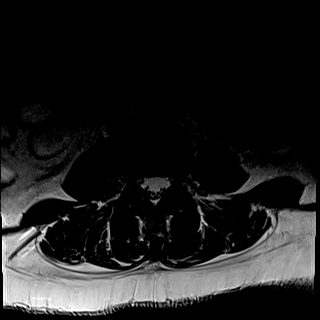
[im 24/43]
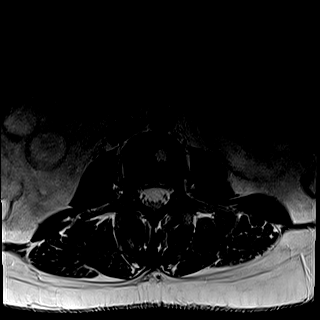
[im 29/43]
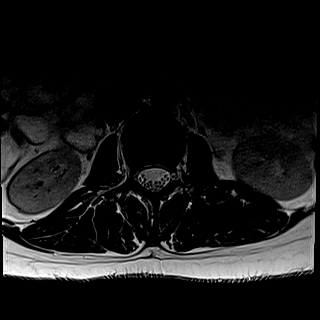
[im 38/43]
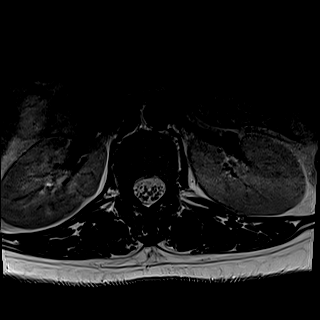
[im 43/43]
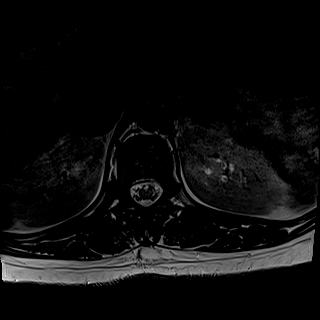

[Series 12: T1 · axial · 4.0mm · 0.39mm/px · z∈[-76,+147]mm · 8 of 43 slices shown (2 of 2)]
[im 1/43]
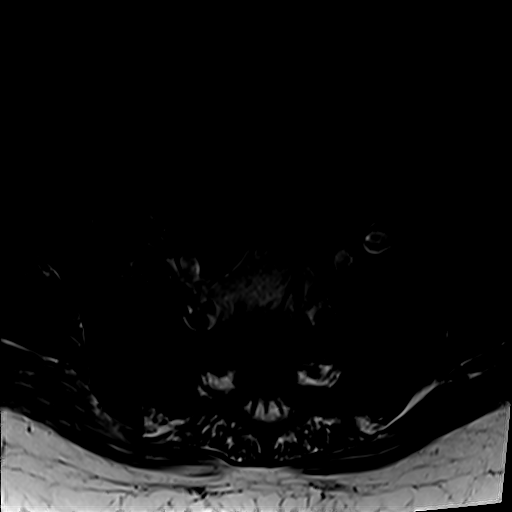
[im 5/43]
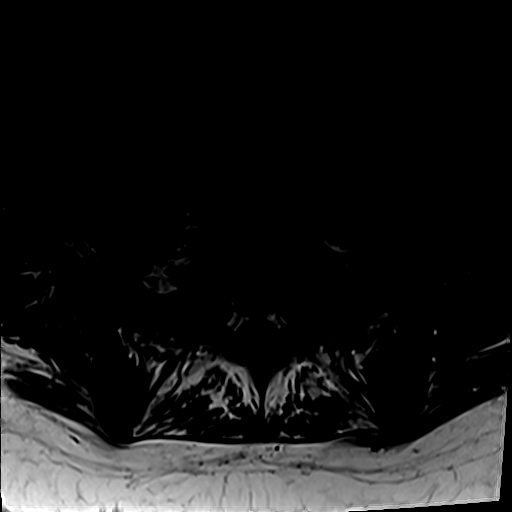
[im 15/43]
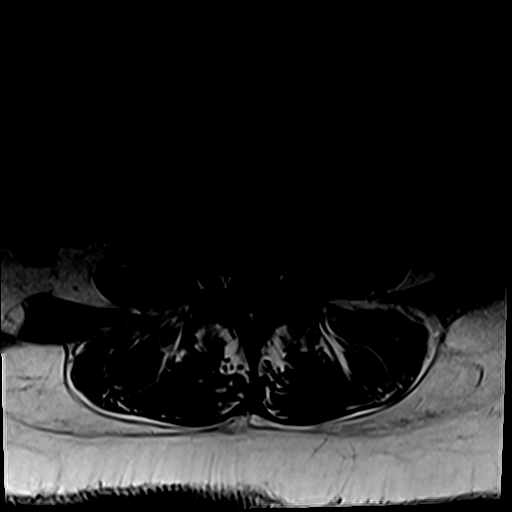
[im 19/43]
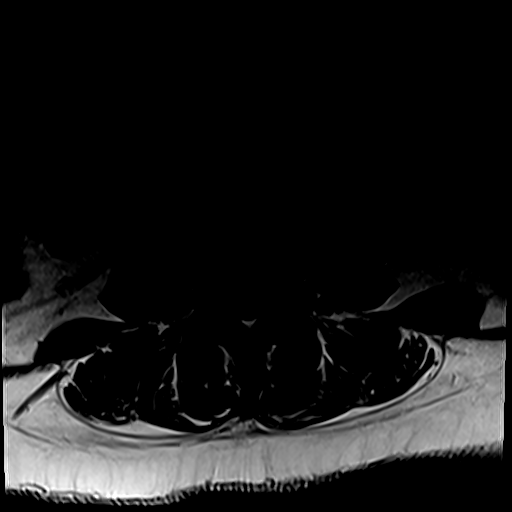
[im 24/43]
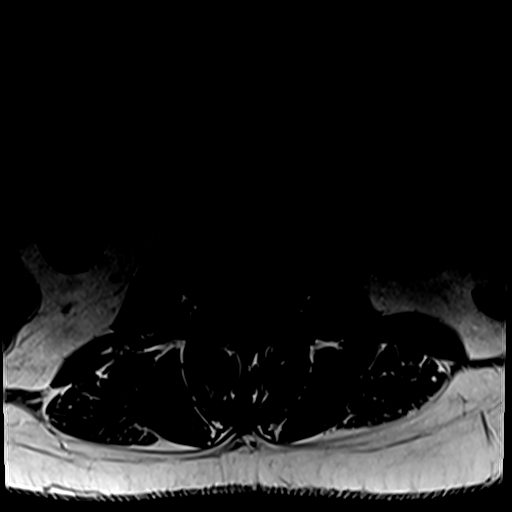
[im 29/43]
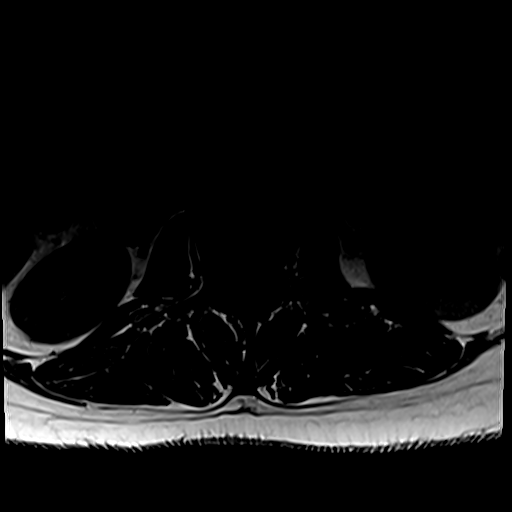
[im 38/43]
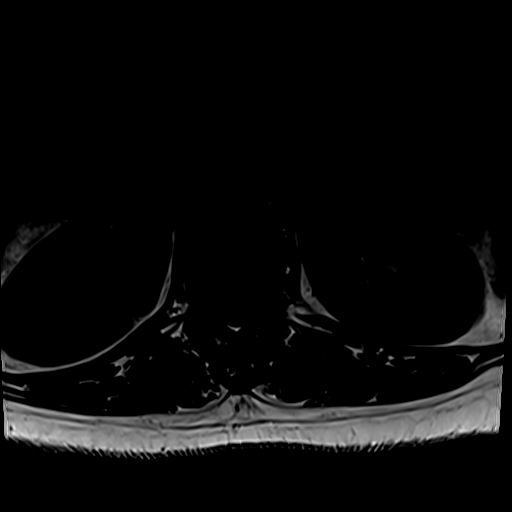
[im 43/43]
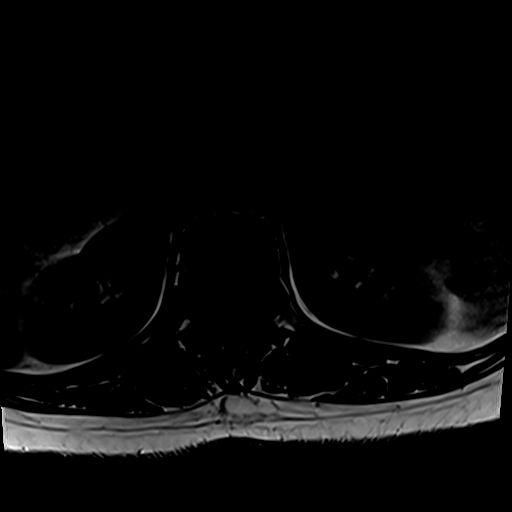

[Series 13: T2 post-contrast · sagittal · 4.0mm · 0.81mm/px · 4 of 17 slices shown]
[im 1/17]
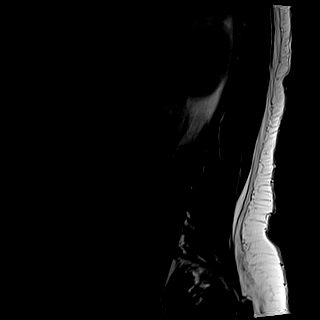
[im 6/17]
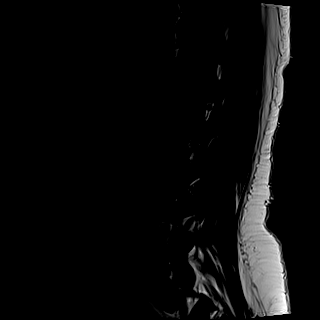
[im 11/17]
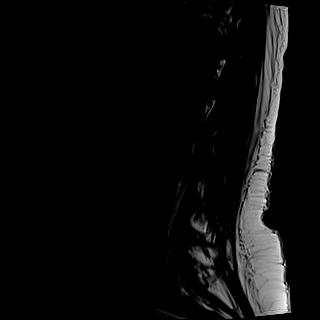
[im 17/17]
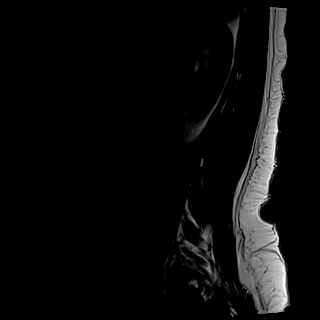

[Series 14: T1 fat-sat post-contrast · sagittal · 4.0mm · 0.81mm/px · 4 of 17 slices shown]
[im 1/17]
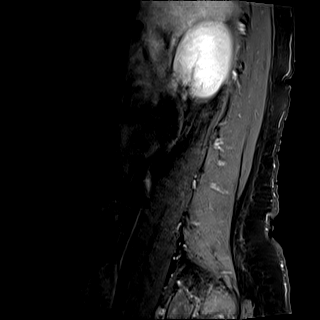
[im 6/17]
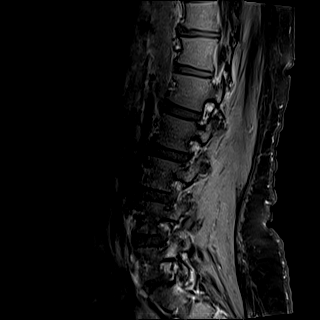
[im 11/17]
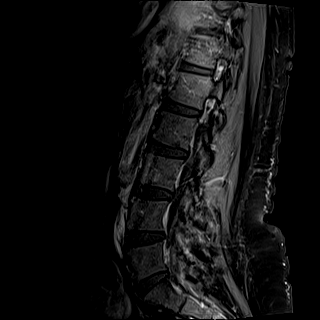
[im 17/17]
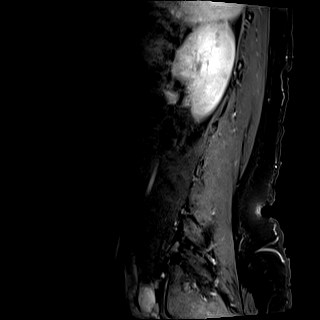

[31 of 48 positions shown; findings below may reference images not displayed]

FINDINGS: Segmentation: There are five lumbar type vertebral bodies. The last
full intervertebral disc space is labeled L5-S1. This correlates
with the prior study.

Alignment:  Normal

Vertebrae: Normal marrow signal. No bone lesions or fractures. No MR
findings suspicious for discitis or osteomyelitis.

Conus medullaris and cauda equina: Conus extends to the L1 level.
Conus and cauda equina appear normal.

Paraspinal and other soft tissues: No significant paraspinal or
retroperitoneal findings.

Disc levels:

No lumbar disc protrusions, spinal or foraminal stenosis.
IMPRESSION: Unremarkable lumbar spine MRI. No MR findings suspicious for
discitis or osteomyelitis.

## 2021-08-02 IMAGING — DX DG CHEST 1V PORT
1 series · 1 of 1 positions shown · non-contrast
Comparison: [DATE]

CLINICAL DATA: Chest pain

EXAM:
PORTABLE CHEST 1 VIEW

[chest ap]
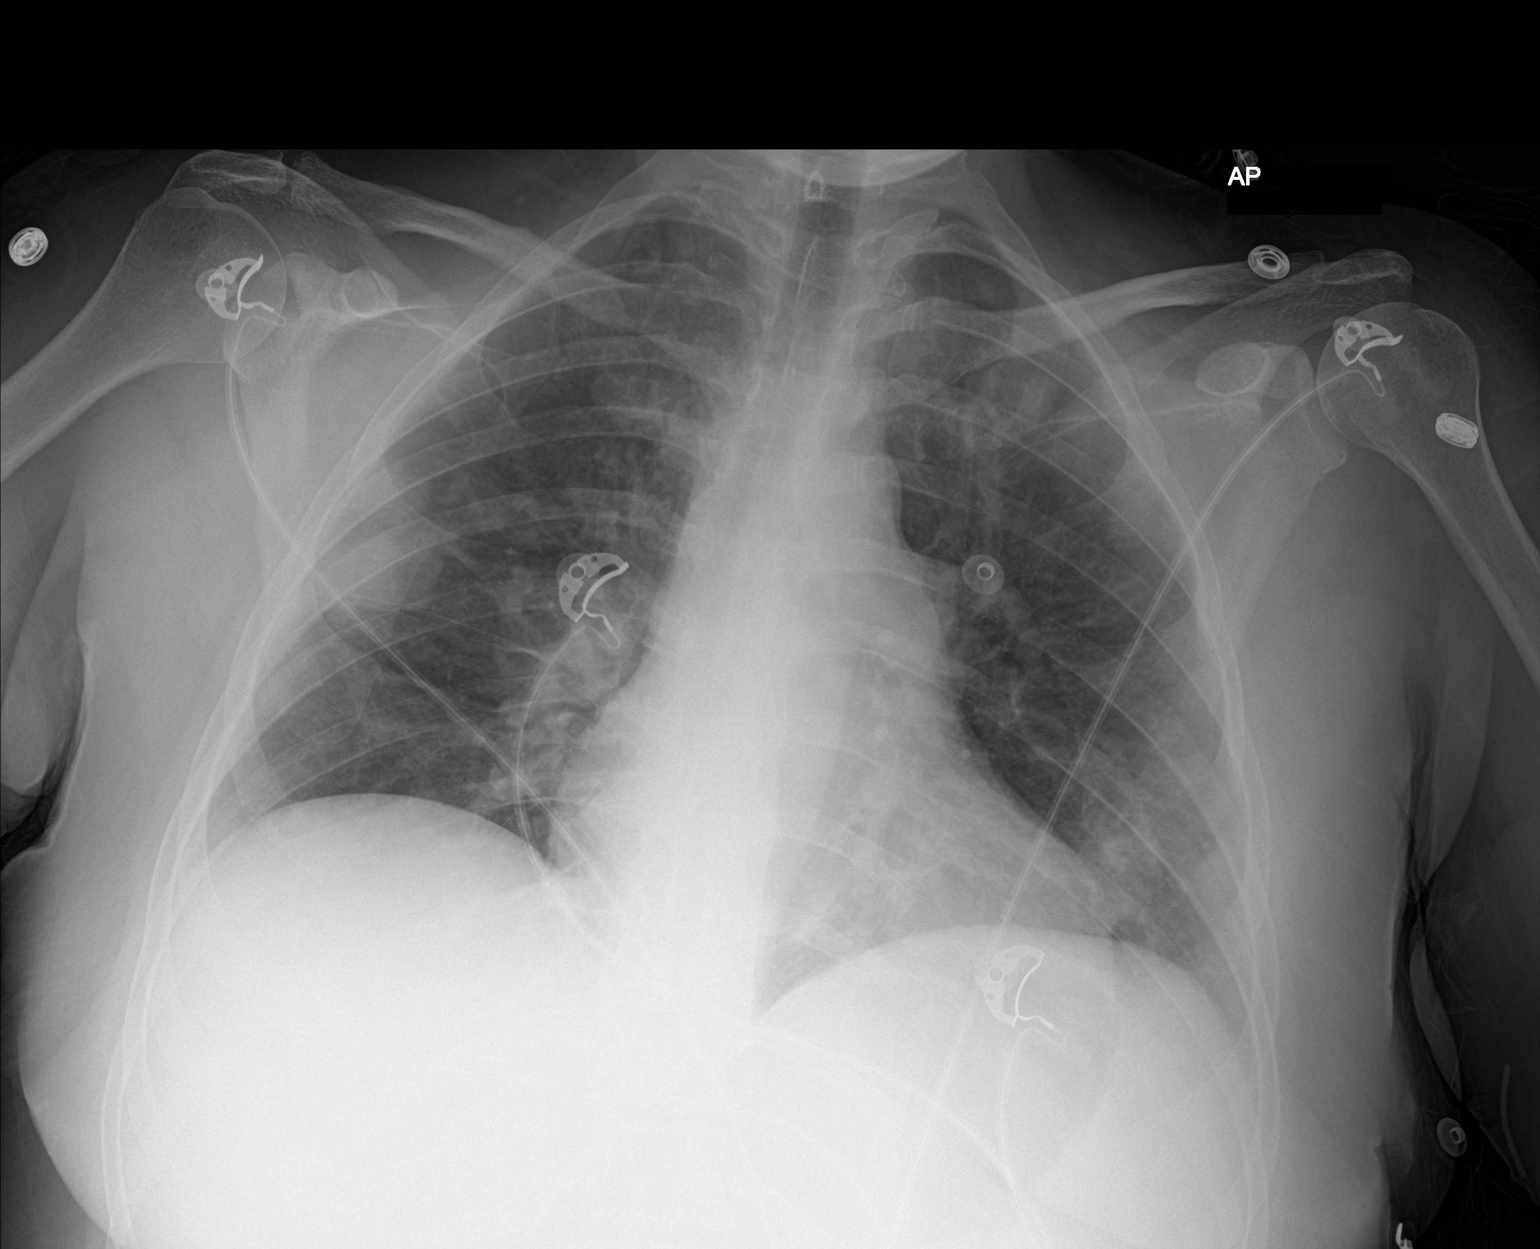

[1 of 1 positions shown; findings below may reference images not displayed]

FINDINGS: Cardiac size is within normal limits. There are no signs of
pulmonary edema. There are new patchy infiltrates in the left upper
right mid and left lower lung fields. There is no significant
pleural effusion or pneumothorax.
IMPRESSION: There are new patchy infiltrates in the right mid lung fields, left
upper and left lower lung fields suggesting possible multifocal
pneumonia. Follow-up studies should be considered to assess
resolution.

## 2021-08-02 MED ORDER — LACTATED RINGERS IV SOLN: INTRAVENOUS | Status: DC

## 2021-08-02 MED ORDER — VANCOMYCIN HCL IN DEXTROSE 750-5 MG/150ML-% IV SOLN
750.0000 mg | Freq: Two times a day (BID) | INTRAVENOUS | Status: DC
  Filled 2021-08-02: qty 150

## 2021-08-02 MED ORDER — POTASSIUM CHLORIDE 10 MEQ/100ML IV SOLN
10.0000 meq | INTRAVENOUS | Status: AC
  Administered 2021-08-02 (×3): 10 meq via INTRAVENOUS
  Filled 2021-08-02 (×3): qty 100

## 2021-08-02 MED ORDER — VANCOMYCIN HCL IN DEXTROSE 1-5 GM/200ML-% IV SOLN
1000.0000 mg | Freq: Once | INTRAVENOUS | Status: AC
  Administered 2021-08-02: 1000 mg via INTRAVENOUS
  Filled 2021-08-02: qty 200

## 2021-08-02 MED ORDER — KETOROLAC TROMETHAMINE 30 MG/ML IJ SOLN
30.0000 mg | Freq: Four times a day (QID) | INTRAMUSCULAR | Status: AC | PRN
  Administered 2021-08-02 – 2021-08-07 (×13): 30 mg via INTRAVENOUS
  Filled 2021-08-02 (×13): qty 1

## 2021-08-02 MED ORDER — LACTATED RINGERS IV BOLUS (SEPSIS)
1250.0000 mL | Freq: Once | INTRAVENOUS | Status: AC
  Administered 2021-08-02: 1250 mL via INTRAVENOUS

## 2021-08-02 MED ORDER — POTASSIUM CHLORIDE CRYS ER 20 MEQ PO TBCR
40.0000 meq | EXTENDED_RELEASE_TABLET | Freq: Once | ORAL | Status: AC
  Administered 2021-08-02: 40 meq via ORAL
  Filled 2021-08-02: qty 2

## 2021-08-02 MED ORDER — CHLORHEXIDINE GLUCONATE CLOTH 2 % EX PADS
6.0000 | MEDICATED_PAD | Freq: Every day | CUTANEOUS | Status: DC
  Administered 2021-08-03 – 2021-09-17 (×41): 6 via TOPICAL

## 2021-08-02 MED ORDER — MORPHINE SULFATE (PF) 4 MG/ML IV SOLN
4.0000 mg | Freq: Once | INTRAVENOUS | Status: AC
Start: 2021-08-02 — End: 2021-08-02
  Administered 2021-08-02: 4 mg via INTRAVENOUS
  Filled 2021-08-02: qty 1

## 2021-08-02 MED ORDER — ACETAMINOPHEN 500 MG PO TABS
1000.0000 mg | ORAL_TABLET | Freq: Once | ORAL | Status: AC
  Administered 2021-08-02: 1000 mg via ORAL
  Filled 2021-08-02: qty 2

## 2021-08-02 MED ORDER — METRONIDAZOLE 500 MG/100ML IV SOLN
500.0000 mg | Freq: Two times a day (BID) | INTRAVENOUS | Status: DC
  Administered 2021-08-02: 500 mg via INTRAVENOUS
  Filled 2021-08-02 (×2): qty 100

## 2021-08-02 MED ORDER — SODIUM CHLORIDE 0.9 % IV SOLN
2.0000 g | Freq: Once | INTRAVENOUS | Status: AC
  Administered 2021-08-02: 2 g via INTRAVENOUS
  Filled 2021-08-02: qty 2

## 2021-08-02 MED ORDER — ENOXAPARIN SODIUM 40 MG/0.4ML IJ SOSY
40.0000 mg | PREFILLED_SYRINGE | INTRAMUSCULAR | Status: DC
  Administered 2021-08-04: 40 mg via SUBCUTANEOUS
  Filled 2021-08-02 (×9): qty 0.4

## 2021-08-02 MED ORDER — SODIUM CHLORIDE 0.9 % IV SOLN
2.0000 g | Freq: Three times a day (TID) | INTRAVENOUS | Status: DC
  Administered 2021-08-03 (×2): 2 g via INTRAVENOUS
  Filled 2021-08-02 (×2): qty 2

## 2021-08-02 MED ORDER — BUPRENORPHINE HCL-NALOXONE HCL 2-0.5 MG SL SUBL
2.0000 | SUBLINGUAL_TABLET | SUBLINGUAL | Status: DC | PRN
  Administered 2021-08-03 (×2): 2 via SUBLINGUAL
  Filled 2021-08-02 (×2): qty 2

## 2021-08-02 MED ORDER — LACTATED RINGERS IV BOLUS
1000.0000 mL | Freq: Once | INTRAVENOUS | Status: AC
  Administered 2021-08-02: 1000 mL via INTRAVENOUS

## 2021-08-02 MED ORDER — BUPRENORPHINE HCL-NALOXONE HCL 8-2 MG SL SUBL
1.0000 | SUBLINGUAL_TABLET | Freq: Two times a day (BID) | SUBLINGUAL | Status: DC
  Filled 2021-08-02: qty 1

## 2021-08-02 MED ORDER — GADOBUTROL 1 MMOL/ML IV SOLN
7.0000 mL | Freq: Once | INTRAVENOUS | Status: AC | PRN
  Administered 2021-08-02: 7 mL via INTRAVENOUS

## 2021-08-02 NOTE — ED Triage Notes (Signed)
Pt BIBA from friend's house. Asked for help getting off heroin, opiates, meth. Last used heroin 3 hrs ago. Denies ETOH. Also c/o pain in lower back and bilateral legs making ambulating difficult. Fever 2 days ago but has fever a lot. Sinus tach on EKG. ? ?120/90 ?RR 40 ?95% RA ?Temp 97.1 ?etCO2 26 ?CBG 183 ?

## 2021-08-02 NOTE — Assessment & Plan Note (Addendum)
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

## 2021-08-02 NOTE — Progress Notes (Signed)
Pharmacy Antibiotic Note ? ?Latoya [name] is a 36 y.o. female admitted on 08/02/2021 with sepsis.  PMH significant for IVDU, h/o MSSA endocarditis (2020), diskitis/osteomyelitis of spine (2021).  Pharmacy has been consulted for Vancomycin and Cefepime dosing. ? ?Patient has received Vancomycin 1gm and Cefepime 2gm IV x 1 dose each in ED. ? ?Plan: ?Cefepime 2gm IV q8h ?Vancomycin 750 mg IV Q 12 hrs. Goal AUC 400-550.  Expected AUC: 451.2   SCr used: 0.97 ?Follow renal function ?F/U culture results and sensitivities ? ?Height: 5\' 6"  (167.6 cm) ?Weight: 68 kg (150 lb) ?IBW/kg (Calculated) : 59.3 ? ?Temp (24hrs), Avg:99.4 ?F (37.4 ?C), Min:99.4 ?F (37.4 ?C), Max:99.4 ?F (37.4 ?C) ? ?Recent Labs  ?Lab 08/02/21 ?1442 08/02/21 ?1710  ?WBC 31.7*  --   ?CREATININE 0.97  --   ?LATICACIDVEN 2.2* 1.8  ?  ?Estimated Creatinine Clearance: 76.5 mL/min (by C-G formula based on SCr of 0.97 mg/dL).   ? ?Allergies  ?Allergen Reactions  ? Naltrexone Anxiety  ?  Severe anxiety, restlessness, vomiting  ? ? ?Antimicrobials this admission: ?3/29 Cefepime >>   ?3/29 Metronidazole >>   ?3/29 Vancomycin >> ? ?Dose adjustments this admission: ?  ? ?Microbiology results: ?3/29 BCx:   ?  ? ?Thank you for allowing pharmacy to be a part of this patient?s care. ? ?4/29, PharmD ?08/02/2021 9:43 PM ? ?

## 2021-08-02 NOTE — H&P (Signed)
?History and Physical  ? ? ?Patient: Latoya [name] RCV:[id number] DOB: 02/22/1986 ?DOA: 08/02/2021 ?DOS: the patient was seen and examined on 08/02/2021 ?PCP: Pcp, No  ?Patient coming from: Home ? ?Chief Complaint:  ?Chief Complaint  ?Patient presents with  ? Drug Problem  ? Back Pain  ? Leg Pain  ? Dysuria  ? ?HPI: Latoya [name] is a 36 y.o. female with medical history significant of IVDU, heroin abuse, ongoing. ? ?Pt with h/o MSSA TV endocarditis in 2020 treated with ancef and angiojet at [facility]. 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 ? ?Pt presents to ED with c/o severe low back pain in past couple of weeks. Symptoms acute onset, moderate-severe, non radiating. Denies leg numbness/weakness. No saddle area numbness. Denies urinary retention, or incontinence. No stool incontinence. Denies dysuria or gu c/o. No abd or pelvic pain. No chest pain or sob. No focal extremity pain or swelling. No headaches. Subjective fever. ? ?Review of Systems: As mentioned in the history of present illness. All other systems reviewed and are negative. ?Past Medical History:  ?Diagnosis Date  ? Heroin addiction (HCC)   ? IV drug abuse (HCC)   ? ?Past Surgical History:  ?Procedure Laterality Date  ? IR LUMBAR DISC ASPIRATION W/IMG GUIDE  07/29/2019  ? ?Social History:  reports that she has been smoking cigarettes. She has never used smokeless tobacco. She reports current alcohol use. She reports current drug use. Drug: Heroin. ? ?Allergies  ?Allergen Reactions  ? Naltrexone Anxiety  ?  Severe anxiety, restlessness, vomiting  ? ? ?Family History  ?Problem Relation Age of Onset  ? Healthy Mother   ? ? ?Prior to Admission medications   ?Medication Sig Start Date End Date Taking? Authorizing Provider  ?linezolid (ZYVOX) 600 MG tablet Take 1 tablet (600 mg total) by mouth 2 (two) times daily. ?Patient not taking: Reported on 08/02/2021 08/10/19   Cliffton Asters, MD  ? ? ?Physical Exam: ?Vitals:  ? 08/02/21 2000 08/02/21 2030  08/02/21 2100 08/02/21 2130  ?BP: 102/67 95/65 (!) 91/56 100/73  ?Pulse: 87 89 88 86  ?Resp: (!) 29 (!) 33 (!) 22 20  ?Temp:      ?TempSrc:      ?SpO2: 98% 97% 100% 96%  ?Weight:      ?Height:      ? ?Constitutional: NAD, calm, comfortable ?Eyes: PERRL, lids and conjunctivae normal ?ENMT: Mucous membranes are moist. Posterior pharynx clear of any exudate or lesions.Normal dentition.  ?Neck: normal, supple, no masses, no thyromegaly ?Respiratory: clear to auscultation bilaterally, no wheezing, no crackles. Normal respiratory effort. No accessory muscle use.  ?Cardiovascular: Regular rate and rhythm, no murmurs / rubs / gallops. No extremity edema. 2+ pedal pulses. No carotid bruits.  ?Abdomen: no tenderness, no masses palpated. No hepatosplenomegaly. Bowel sounds positive.  ?Musculoskeletal: Proximal to mid lumbar spine TTP. ?Skin: Scabs / cellulitis in areas of IVDU. ?Neurologic: CN 2-12 grossly intact. Sensation intact, DTR normal. Strength 5/5 in all 4.  ?Psychiatric: Normal judgment and insight. Alert and oriented x 3. Normal mood.  ? ?Data Reviewed: ? ?WBC 30k, CXR showing multifocal PNA, MRI of L spine negative for osteomyelitis, diskitis, or abscess. ?I-Stat b-HCG = 12.4, probably a false positive as the I-stats tend to do this but will send for lab quant to confirm. ? ?Assessment and Plan: ?* Sepsis (HCC) ?Sepsis with hypotension, multifocal PNA, in setting of IVDU, h/o MSSA bacterial endocarditis in 2020 (TV s/p angioget at [facility]), h/o MRSA+  in 2021. ?Presentation worrisome for recurrent endocarditis again. ?Sepsis pathway ?Giving 1250cc LR to complete the 2250cc 30cc/kg IVF bolus given soft BPs (91 systolic) in ED. ?Tele monitor ?BCx ordered ?2d echo ordered ?Covid and flu ordered (though with WBC of 30k! HPI given above, I doubt Covid or influenza is driving her presentation today). ?Empiric cefepime, flagyl, vanc ?MRSA PCR nares ?May need TEE, may need ID consult (especially if BCx come back positive as I  suspect). ?Osteomyelitis / diskitis of lumbar spine ruled out by MRI ? ?History of bacterial endocarditis ?TV in 2020, MSSA, got angiovac at [facility]. ?Treated with Ancef at that time. ? ?Opioid use disorder ?suboxone protocol ? ?IV drug abuse (HCC) ?Ongoing ? ?Anemia of infection and chronic disease ?Chronic, appears stable. ?Follow daily CBCs ? ? ? ? ? Advance Care Planning:   Code Status: Full Code ? ?Consults: None ? ?Family Communication: no family in room ? ?Severity of Illness: ?The appropriate patient status for this patient is INPATIENT. Inpatient status is judged to be reasonable and necessary in order to provide the required intensity of service to ensure the patient's safety. The patient's presenting symptoms, physical exam findings, and initial radiographic and laboratory data in the context of their chronic comorbidities is felt to place them at high risk for further clinical deterioration. Furthermore, it is not anticipated that the patient will be medically stable for discharge from the hospital within 2 midnights of admission.  ? ?* I certify that at the point of admission it is my clinical judgment that the patient will require inpatient hospital care spanning beyond 2 midnights from the point of admission due to high intensity of service, high risk for further deterioration and high frequency of surveillance required.* ? ?Author: ?Hillary Bow., DO ?08/02/2021 9:41 PM ? ?For on call review [email or web address].  ?

## 2021-08-02 NOTE — Assessment & Plan Note (Addendum)
TV in 2020, MSSA, got angiovac at [facility]. Treated with Ancef at that time. 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 ?See above. ?

## 2021-08-02 NOTE — Assessment & Plan Note (Addendum)
Has been counseled.  Due to PE she is requiring narcotic pain medications. ? ?

## 2021-08-02 NOTE — ED Notes (Signed)
Sent one set of cultures, blue top and dark green extras down to lab ?

## 2021-08-02 NOTE — ED Notes (Signed)
Patient transported to MRI 

## 2021-08-02 NOTE — ED Notes (Addendum)
Pt aware UA needed. Pt refused to stand to transfer to wheelchair to go to bathroom, pt unable to turn or lift legs for bedpan, states pain in legs is too bad. Purewick placed.  ?

## 2021-08-02 NOTE — ED Provider Notes (Signed)
?[facility]-EMERGENCY DEPT ?Provider Note ? ? ?CSN: [id number] ?Arrival date & time: 08/02/21  1354 ? ?  ? ?History ? ?Chief Complaint  ?Patient presents with  ? Drug Problem  ? Back Pain  ? Leg Pain  ? Dysuria  ? ? ?Latoya [name] is a 36 y.o. female. ? ?Patient with hx ivda, c/o severe low back pain in past couple weeks. Symptoms acute onset, moderate-severe, non radiating. Denies leg numbness/weakness. No saddle area numbness. Denies urinary retention, or incontinence. No stool incontinence. Denies dysuria or gu c/o. No abd or pelvic pain. No chest pain or sob. No focal extremity pain or swelling. No headaches. Subjective fever. Remote hx lumbar osteo/disciitis.  ? ?The history is provided by the patient, medical records and the EMS personnel.  ?Drug Problem ?Pertinent negatives include no chest pain, no abdominal pain, no headaches and no shortness of breath.  ?Back Pain ?Associated symptoms: dysuria, fever and leg pain   ?Associated symptoms: no abdominal pain, no chest pain and no headaches   ?Leg Pain ?Associated symptoms: back pain and fever   ?Associated symptoms: no neck pain   ?Dysuria ?Associated symptoms: fever   ?Associated symptoms: no abdominal pain, no flank pain and no vomiting   ? ?  ? ?Home Medications ?Prior to Admission medications   ?Medication Sig Start Date End Date Taking? Authorizing Provider  ?linezolid (ZYVOX) 600 MG tablet Take 1 tablet (600 mg total) by mouth 2 (two) times daily. 08/10/19   Cliffton Asters, MD  ?   ? ?Allergies    ?Naltrexone   ? ?Review of Systems   ?Review of Systems  ?Constitutional:  Positive for fever.  ?HENT:  Negative for sore throat.   ?Eyes:  Negative for pain, redness and visual disturbance.  ?Respiratory:  Negative for cough and shortness of breath.   ?Cardiovascular:  Negative for chest pain and leg swelling.  ?Gastrointestinal:  Negative for abdominal pain, diarrhea and vomiting.  ?Genitourinary:  Positive for dysuria. Negative for flank  pain.  ?Musculoskeletal:  Positive for back pain. Negative for neck pain.  ?Skin:  Negative for rash.  ?Neurological:  Negative for headaches.  ?Hematological:  Does not bruise/bleed easily.  ?Psychiatric/Behavioral:  Negative for confusion.   ? ?Physical Exam ?Updated Vital Signs ?BP 113/73   Pulse (!) 102   Temp 99.4 ?F (37.4 ?C) (Oral) Comment: Simultaneous filing. User may not have seen previous data. Comment (Src): Simultaneous filing. User may not have seen previous data.  Resp (!) 29   Ht 1.676 m (5\' 6" )   Wt 68 kg   SpO2 100%   BMI 24.21 kg/m?  ?Physical Exam ?Vitals and nursing note reviewed.  ?Constitutional:   ?   Appearance: Normal appearance. She is well-developed.  ?HENT:  ?   Head: Atraumatic.  ?   Nose: Nose normal.  ?   Mouth/Throat:  ?   Mouth: Mucous membranes are moist.  ?Eyes:  ?   General: No scleral icterus. ?   Conjunctiva/sclera: Conjunctivae normal.  ?   Pupils: Pupils are equal, round, and reactive to light.  ?Neck:  ?   Trachea: No tracheal deviation.  ?Cardiovascular:  ?   Rate and Rhythm: Normal rate and regular rhythm.  ?   Pulses: Normal pulses.  ?   Heart sounds: Normal heart sounds. No murmur heard. ?  No friction rub. No gallop.  ?Pulmonary:  ?   Effort: Pulmonary effort is normal. No respiratory distress.  ?   Breath sounds:  Normal breath sounds.  ?Abdominal:  ?   General: Bowel sounds are normal. There is no distension.  ?   Palpations: Abdomen is soft. There is no mass.  ?   Tenderness: There is no abdominal tenderness. There is no guarding.  ?Genitourinary: ?   Comments: No cva tenderness.  ?Musculoskeletal:     ?   General: No swelling.  ?   Cervical back: Normal range of motion and neck supple. No rigidity. No muscular tenderness.  ?   Comments: Proximal to mid lumbar tenderness, otherwise CTLS spine, non tender, aligned, no step off. ?Good rom bil extremities without pain or focal bony tenderness.  ?Several scabs in areas of prior ivda. No focal sts, no crepitus.    ?Skin: ?   General: Skin is warm and dry.  ?   Findings: No rash.  ?Neurological:  ?   Mental Status: She is alert.  ?   Comments: Alert, speech normal. Motor/sens grossly intact bil.   ?Psychiatric:     ?   Mood and Affect: Mood normal.  ? ? ?ED Results / Procedures / Treatments   ?Labs ?(all labs ordered are listed, but only abnormal results are displayed) ?Results for orders placed or performed during the hospital encounter of 08/02/21  ?Lactic acid, plasma  ?Result Value Ref Range  ? Lactic Acid, Venous 2.2 (HH) 0.5 - 1.9 mmol/L  ?Comprehensive metabolic panel  ?Result Value Ref Range  ? Sodium 127 (L) 135 - 145 mmol/L  ? Potassium 2.4 (LL) 3.5 - 5.1 mmol/L  ? Chloride 93 (L) 98 - 111 mmol/L  ? CO2 21 (L) 22 - 32 mmol/L  ? Glucose, Bld 108 (H) 70 - 99 mg/dL  ? BUN 25 (H) 6 - 20 mg/dL  ? Creatinine, Ser 0.97 0.44 - 1.00 mg/dL  ? Calcium 7.8 (L) 8.9 - 10.3 mg/dL  ? Total Protein 8.2 (H) 6.5 - 8.1 g/dL  ? Albumin 2.7 (L) 3.5 - 5.0 g/dL  ? AST 53 (H) 15 - 41 U/L  ? ALT 19 0 - 44 U/L  ? Alkaline Phosphatase 77 38 - 126 U/L  ? Total Bilirubin 0.8 0.3 - 1.2 mg/dL  ? GFR, Estimated >60 >60 mL/min  ? Anion gap 13 5 - 15  ?CBC with Differential  ?Result Value Ref Range  ? WBC 31.7 (H) 4.0 - 10.5 K/uL  ? RBC 4.45 3.87 - 5.11 MIL/uL  ? Hemoglobin 10.4 (L) 12.0 - 15.0 g/dL  ? HCT 30.8 (L) 36.0 - 46.0 %  ? MCV 69.2 (L) 80.0 - 100.0 fL  ? MCH 23.4 (L) 26.0 - 34.0 pg  ? MCHC 33.8 30.0 - 36.0 g/dL  ? RDW 19.8 (H) 11.5 - 15.5 %  ? Platelets 199 150 - 400 K/uL  ? nRBC 0.0 0.0 - 0.2 %  ? Neutrophils Relative % PENDING %  ? Neutro Abs PENDING 1.7 - 7.7 K/uL  ? Band Neutrophils PENDING %  ? Lymphocytes Relative PENDING %  ? Lymphs Abs PENDING 0.7 - 4.0 K/uL  ? Monocytes Relative PENDING %  ? Monocytes Absolute PENDING 0.1 - 1.0 K/uL  ? Eosinophils Relative PENDING %  ? Eosinophils Absolute PENDING 0.0 - 0.5 K/uL  ? Basophils Relative PENDING %  ? Basophils Absolute PENDING 0.0 - 0.1 K/uL  ? WBC Morphology PENDING   ? RBC Morphology  PENDING   ? Smear Review PENDING   ? Other PENDING %  ? nRBC PENDING 0 /100 WBC  ? Metamyelocytes  Relative PENDING %  ? Myelocytes PENDING %  ? Promyelocytes Relative PENDING %  ? Blasts PENDING %  ? Immature Granulocytes PENDING %  ? Abs Immature Granulocytes PENDING 0.00 - 0.07 K/uL  ?I-Stat beta hCG blood, ED  ?Result Value Ref Range  ? I-stat hCG, quantitative 12.4 (H) <5 mIU/mL  ? Comment 3          ? ?DG Chest Port 1 View ? ?Result Date: 08/02/2021 ?CLINICAL DATA:  Chest pain EXAM: PORTABLE CHEST 1 VIEW COMPARISON:  03/24/2021 FINDINGS: Cardiac size is within normal limits. There are no signs of pulmonary edema. There are new patchy infiltrates in the left upper right mid and left lower lung fields. There is no significant pleural effusion or pneumothorax. IMPRESSION: There are new patchy infiltrates in the right mid lung fields, left upper and left lower lung fields suggesting possible multifocal pneumonia. Follow-up studies should be considered to assess resolution. Electronically Signed   By: Ernie Avena M.D.   On: 08/02/2021 14:52   ? ? ?EKG ?EKG Interpretation ? ?Date/Time:  Wednesday August 02 2021 15:32:13 EDT ?Ventricular Rate:  101 ?PR Interval:  151 ?QRS Duration: 123 ?QT Interval:  407 ?QTC Calculation: 528 ?R Axis:   28 ?Text Interpretation: Sinus tachycardia Nonspecific intraventricular conduction delay Confirmed by Cathren Laine ([id number]) on 08/02/2021 7:19:27 PM ? ?Radiology ?MR Lumbar Spine W Wo Contrast ? ?Result Date: 08/02/2021 ?CLINICAL DATA:  Low back pain. Infection suspected. Rule out discitis or abscess. EXAM: MRI LUMBAR SPINE WITHOUT AND WITH CONTRAST TECHNIQUE: Multiplanar and multiecho pulse sequences of the lumbar spine were obtained without and with intravenous contrast. CONTRAST:  38mL GADAVIST GADOBUTROL 1 MMOL/ML IV SOLN COMPARISON:  MRI 07/28/2019 FINDINGS: Segmentation: There are five lumbar type vertebral bodies. The last full intervertebral disc space is labeled L5-S1. This  correlates with the prior study. Alignment:  Normal Vertebrae: Normal marrow signal. No bone lesions or fractures. No MR findings suspicious for discitis or osteomyelitis. Conus medullaris and cauda equina: Conu

## 2021-08-02 NOTE — Assessment & Plan Note (Addendum)
Iron panel with serum iron of 22, transferrin saturation 7, ferritin 51 and TIBC 312. ?--Ferrous gluconate 324 mg p.o. daily ?

## 2021-08-02 NOTE — ED Notes (Signed)
Unsuccessful stick for labs, will reach out for assistance.  ?

## 2021-08-02 NOTE — Assessment & Plan Note (Deleted)
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 ?Presentation worrisome for recurrent endocarditis again. ?1. Sepsis pathway ?2. Giving 1250cc LR to complete the 2250cc 30cc/kg IVF bolus given soft BPs (91 systolic) in ED. ?3. Tele monitor ?4. BCx ordered ?5. 2d echo ordered ?6. Covid and flu ordered (though with WBC of 30k! HPI given above, I doubt Covid or influenza is driving her presentation today). ?7. Empiric cefepime, flagyl, vanc ?8. MRSA PCR nares ?9. May need TEE, may need ID consult (especially if BCx come back positive as I suspect). ?10. Osteomyelitis / diskitis of lumbar spine ruled out by MRI ?

## 2021-08-03 ENCOUNTER — Inpatient Hospital Stay (HOSPITAL_COMMUNITY): Payer: Medicaid Other

## 2021-08-03 DIAGNOSIS — D72829 Elevated white blood cell count, unspecified: Secondary | ICD-10-CM | POA: Diagnosis not present

## 2021-08-03 DIAGNOSIS — F1910 Other psychoactive substance abuse, uncomplicated: Secondary | ICD-10-CM | POA: Diagnosis not present

## 2021-08-03 DIAGNOSIS — A419 Sepsis, unspecified organism: Secondary | ICD-10-CM | POA: Diagnosis not present

## 2021-08-03 DIAGNOSIS — J189 Pneumonia, unspecified organism: Secondary | ICD-10-CM | POA: Diagnosis not present

## 2021-08-03 DIAGNOSIS — B9562 Methicillin resistant Staphylococcus aureus infection as the cause of diseases classified elsewhere: Secondary | ICD-10-CM | POA: Diagnosis not present

## 2021-08-03 DIAGNOSIS — Z8679 Personal history of other diseases of the circulatory system: Secondary | ICD-10-CM | POA: Diagnosis not present

## 2021-08-03 DIAGNOSIS — D638 Anemia in other chronic diseases classified elsewhere: Secondary | ICD-10-CM | POA: Diagnosis not present

## 2021-08-03 DIAGNOSIS — R7881 Bacteremia: Secondary | ICD-10-CM

## 2021-08-03 DIAGNOSIS — I38 Endocarditis, valve unspecified: Secondary | ICD-10-CM | POA: Diagnosis not present

## 2021-08-03 DIAGNOSIS — B999 Unspecified infectious disease: Secondary | ICD-10-CM | POA: Diagnosis not present

## 2021-08-03 LAB — ECHOCARDIOGRAM COMPLETE
Area-P 1/2: 3.77 cm2
Height: 66 in
S' Lateral: 2.6 cm
Weight: 2899.49 oz

## 2021-08-03 LAB — BLOOD CULTURE ID PANEL (REFLEXED) - BCID2

## 2021-08-03 LAB — CBC
HCT: 25.6 % — ABNORMAL LOW (ref 36.0–46.0)
Hemoglobin: 8.2 g/dL — ABNORMAL LOW (ref 12.0–15.0)
MCH: 22.5 pg — ABNORMAL LOW (ref 26.0–34.0)
MCHC: 32 g/dL (ref 30.0–36.0)
MCV: 70.3 fL — ABNORMAL LOW (ref 80.0–100.0)
Platelets: 170 10*3/uL (ref 150–400)
RBC: 3.64 MIL/uL — ABNORMAL LOW (ref 3.87–5.11)
RDW: 19.9 % — ABNORMAL HIGH (ref 11.5–15.5)
WBC: 24 10*3/uL — ABNORMAL HIGH (ref 4.0–10.5)
nRBC: 0 % (ref 0.0–0.2)

## 2021-08-03 LAB — BASIC METABOLIC PANEL
Anion gap: 8 (ref 5–15)
BUN: 22 mg/dL — ABNORMAL HIGH (ref 6–20)
CO2: 20 mmol/L — ABNORMAL LOW (ref 22–32)
Calcium: 7.8 mg/dL — ABNORMAL LOW (ref 8.9–10.3)
Chloride: 101 mmol/L (ref 98–111)
Creatinine, Ser: 0.82 mg/dL (ref 0.44–1.00)
GFR, Estimated: >60 mL/min (ref >60)
Glucose, Bld: 131 mg/dL — ABNORMAL HIGH (ref 70–99)
Potassium: 2.8 mmol/L — ABNORMAL LOW (ref 3.5–5.1)
Sodium: 129 mmol/L — ABNORMAL LOW (ref 135–145)

## 2021-08-03 LAB — PROTIME-INR
INR: 1.7 — ABNORMAL HIGH (ref 0.8–1.2)
Prothrombin Time: 19.7 seconds — ABNORMAL HIGH (ref 11.4–15.2)

## 2021-08-03 LAB — HIV ANTIBODY (ROUTINE TESTING W REFLEX): HIV Screen 4th Generation wRfx: NONREACTIVE

## 2021-08-03 LAB — MRSA NEXT GEN BY PCR, NASAL: MRSA by PCR Next Gen: DETECTED — AB

## 2021-08-03 LAB — CORTISOL-AM, BLOOD: Cortisol - AM: 32.7 ug/dL — ABNORMAL HIGH (ref 6.7–22.6)

## 2021-08-03 LAB — PROCALCITONIN: Procalcitonin: 3.86 ng/mL

## 2021-08-03 MED ORDER — ALBUTEROL SULFATE HFA 108 (90 BASE) MCG/ACT IN AERS: 2.0000 | INHALATION_SPRAY | Freq: Four times a day (QID) | RESPIRATORY_TRACT | Status: DC | PRN

## 2021-08-03 MED ORDER — VANCOMYCIN HCL 1.25 G IV SOLR
1250.0000 mg | Freq: Two times a day (BID) | INTRAVENOUS | Status: DC
  Filled 2021-08-03: qty 25

## 2021-08-03 MED ORDER — POTASSIUM CHLORIDE CRYS ER 20 MEQ PO TBCR
40.0000 meq | EXTENDED_RELEASE_TABLET | Freq: Once | ORAL | Status: AC
  Administered 2021-08-03: 40 meq via ORAL
  Filled 2021-08-03: qty 2

## 2021-08-03 MED ORDER — ACETAMINOPHEN 325 MG PO TABS
650.0000 mg | ORAL_TABLET | Freq: Four times a day (QID) | ORAL | Status: DC | PRN
  Administered 2021-08-04 – 2021-09-07 (×32): 650 mg via ORAL
  Filled 2021-08-03 (×36): qty 2

## 2021-08-03 MED ORDER — LORAZEPAM 1 MG PO TABS
1.0000 mg | ORAL_TABLET | ORAL | Status: DC | PRN
  Administered 2021-08-03 – 2021-08-05 (×6): 1 mg via ORAL
  Filled 2021-08-03 (×6): qty 1

## 2021-08-03 MED ORDER — MUPIROCIN 2 % EX OINT
1.0000 "application " | TOPICAL_OINTMENT | Freq: Two times a day (BID) | CUTANEOUS | Status: AC
  Administered 2021-08-03 – 2021-08-07 (×9): 1 via NASAL
  Filled 2021-08-03 (×3): qty 22

## 2021-08-03 MED ORDER — SODIUM CHLORIDE (PF) 0.9 % IJ SOLN
INTRAMUSCULAR | Status: AC
  Filled 2021-08-03: qty 50

## 2021-08-03 MED ORDER — IBUPROFEN 200 MG PO TABS: 600.0000 mg | ORAL_TABLET | Freq: Four times a day (QID) | ORAL | Status: DC | PRN

## 2021-08-03 MED ORDER — POTASSIUM CHLORIDE CRYS ER 20 MEQ PO TBCR
40.0000 meq | EXTENDED_RELEASE_TABLET | Freq: Two times a day (BID) | ORAL | Status: DC
  Administered 2021-08-03 (×2): 40 meq via ORAL
  Filled 2021-08-03 (×2): qty 2

## 2021-08-03 MED ORDER — PANTOPRAZOLE SODIUM 40 MG PO TBEC
40.0000 mg | DELAYED_RELEASE_TABLET | Freq: Two times a day (BID) | ORAL | Status: DC
  Administered 2021-08-03 – 2021-08-05 (×5): 40 mg via ORAL
  Filled 2021-08-03 (×5): qty 1

## 2021-08-03 MED ORDER — MAGNESIUM OXIDE -MG SUPPLEMENT 400 (240 MG) MG PO TABS
400.0000 mg | ORAL_TABLET | Freq: Two times a day (BID) | ORAL | Status: DC
  Administered 2021-08-03 – 2021-09-17 (×91): 400 mg via ORAL
  Filled 2021-08-03 (×91): qty 1

## 2021-08-03 MED ORDER — VANCOMYCIN HCL 750 MG/150ML IV SOLN
750.0000 mg | Freq: Two times a day (BID) | INTRAVENOUS | Status: DC
  Administered 2021-08-03: 750 mg via INTRAVENOUS
  Filled 2021-08-03: qty 150

## 2021-08-03 MED ORDER — BUPRENORPHINE HCL-NALOXONE HCL 8-2 MG SL SUBL
1.0000 | SUBLINGUAL_TABLET | Freq: Two times a day (BID) | SUBLINGUAL | Status: DC
  Administered 2021-08-03 – 2021-08-05 (×4): 1 via SUBLINGUAL
  Filled 2021-08-03 (×4): qty 1

## 2021-08-03 MED ORDER — VANCOMYCIN HCL IN DEXTROSE 1-5 GM/200ML-% IV SOLN: 1000.0000 mg | Freq: Two times a day (BID) | INTRAVENOUS | Status: DC

## 2021-08-03 MED ORDER — VANCOMYCIN HCL 10 G IV SOLR
1250.0000 mg | Freq: Two times a day (BID) | INTRAVENOUS | Status: DC
Start: 2021-08-03 — End: 2021-08-04
  Administered 2021-08-03 – 2021-08-04 (×2): 1250 mg via INTRAVENOUS
  Filled 2021-08-03 (×3): qty 12.5
  Filled 2021-08-03: qty 25

## 2021-08-03 NOTE — Progress Notes (Signed)
?  Echocardiogram ?2D Echocardiogram has been performed. ? ?Latoya [name] ?08/03/2021, 2:15 PM ?

## 2021-08-03 NOTE — Progress Notes (Signed)
Chaplain engaged in an initial visit with [employer].  Chaplain spent time getting to know Sharnette and what is important to her.  She shared about having two beautiful daughters who are almost teenagers.  She hopes in the future to have a relationship with them.  They are currently being cared for by family.  She also voiced that she desires to "kick addictions butt."  Chaplain candidly asked her about where she is in her addiction journey and she noted that she does want to be sober and clean.  She is looking forward to getting out of the hospital and not coming back.  ? ?Maysoon also shared her story of addiction which began at age 2.  She shared that she was hanging with the "wrong crowd" and tried a drug with them which she called a "cigarette."  She remembers being really high after having about three or four puffs.  ? ?Chaplain provided a compassionate presence, making space for Davelyn to share whatever felt right for her.  Chaplain also provided prayer over her, affirming her hopes and dreams, as well as reminding her of the grace and forgiveness that belongs to her.  Chaplain worked to illuminate that addiction is difficult and is a battle that many people face.  Chaplain provided listening and spiritual care.  ? ?Chaplain could assess that Emmaley is willing to answer questions when asked or prompted but may still feel somewhat withdrawn, ashamed, or thinking deeply about something on her mind.   ? ? ? 08/03/21 1100  ?Clinical Encounter Type  ?Visited With Patient  ?Visit Type Initial;Spiritual support  ?Spiritual Encounters  ?Spiritual Needs Prayer  ? ? ?

## 2021-08-03 NOTE — Hospital Course (Addendum)
Patient is a 36 years old female with past medical history of MSSA tricuspid valve endocarditis in 2020 as well as MRSA discitis and osteomyelitis in 2021 with ongoing IV drug abuse presented to hospital with low back pain, cough and fever.  MRI of the lumbar spine was negative for infection but blood culture was positive for MRSA bacteremia with tricuspid valve vegetation and septic pulmonary emboli.  Patient was seen by CT surgery and underwent angio VAC procedure, but the discovery of a significant PFO required the abortion of this procedure.  She has been seen by [facility] and plan is to continue antibiotic until 09/16/2021 and to repeat TTE about 5 to 7 days prior to discontinuation of antibiotics. ?Subsequently patient developed fever along with chest pain.  CT angiogram of the chest raised concern for PE.  Septic emboli was also considered.  ID will need to reevaluate patient. ?

## 2021-08-03 NOTE — Consult Note (Signed)
? ? ?[facility]  ?                                                                                     ? ?Patient Identification: ?Patient Name: Latoya [name] MRN: [id number] [place] Date: 08/02/2021  1:58 PM ?Today's Date: 08/03/2021 ?Reason for consult: MRSA bacteremia   ?Requesting provider: CHAMP auto consult  ? ?Principal Problem: ?  Sepsis ([place]) ?Active Problems: ?  Anemia of infection and chronic disease ?  Opioid use disorder ?  IV drug abuse ([place]) ?  History of bacterial endocarditis ? ? ?Antibiotics:  ?Vancomycin 3/29-c ?Cefepime 3/29-c ?Metronidazole 3/29-c ? ?Lines/Hardware: ? ?Assessment ?# High grade MRSA bacteremia with possible septic pulmonary emboli/Endocarditis ? ?# Lower back pain-MRI L-spine negative for discitis osteomyelitis ? ?# Active IVDU ?# Smoker ? ?Others ?# MSSA TV endocarditis in 202- treated with cefazolin and angiovac at [facility] ?# MRSA L4-L5 facet joint septic arthritis in 07/2019( IV Vancomycin >>Linezolid  on discharge? Compliance)  ? ?Recommendations  ?-Continue Vancomycin, pharmacy to dose ?-Repeat 2 sets of blood cx 3/31 ?-CT chest for concerns of pulmonary septic emboli ?-TTE, may need TEE ?- Check for HCV RNA, Hepatitis B serology  ?- Opioid use withdrawal/management per primary  ?-Following  ? ?Rest of the management as per the primary team. Please call with questions or concerns.  ?Thank you for the consult ? ?Rosiland Oz, MD ?Infectious Disease Physician ?[facility] ?[address] ?[place], [place] [address] ?Phone: [phone]  Fax: [phone] ? ?__________________________________________________________________________________________________________ ?HPI and Hospital Course: ?36 Y O Female with IVDU ( active), MSSA TV endocarditis in 2020( treated with cefazolin and angiovac at [facility]),  MRSA L4-L5 facet joint septic arthritis in  07/2019( IV Vancomycin >>Linezolid  on discharge? Compliance) who presented to the ED with lower back and bilateral leg pain for couple of weeks. Also had with subjective fever.  Denies nausea, vomiting or diarrhea.  Last use of IV heroin prior to ED visit. ? ?At ED, afebrile ?Labs remarkable for NA 127, K2.4, CR 0.97, AST 53, lactic acid 2.2, CRP 21.3, ESR 86, WBC 31.7 ?Chest x-ray with new patchy infiltrates in the right midlung fields, left upper and left lower lung field suggestive of possible multifocal pneumonia ? ?MRI lumbar spine unremarkable for discitis or osteomyelitis ? ?ROS: Subjective fever and chills present ?Nausea present.  Denies vomiting or diarrhea ?Generalized pain all over the body ?Denies cough, SOB and chest pain + ? ?Past Medical History:  ?Diagnosis Date  ? Heroin addiction ([place])   ? IV drug abuse ([place])   ? ?Past Surgical History:  ?Procedure Laterality Date  ? IR LUMBAR DISC ASPIRATION W/IMG GUIDE  07/29/2019  ? ?Scheduled Meds: ? buprenorphine-naloxone  1 tablet Sublingual BID  ? Chlorhexidine Gluconate Cloth  6 each Topical Daily  ? enoxaparin (LOVENOX) injection  40 mg Subcutaneous Q24H  ? magnesium oxide  400 mg Oral BID  ? mupirocin ointment  1 application. Nasal BID  ? pantoprazole  40 mg Oral BID  ? potassium chloride  40 mEq Oral BID  ? ?Continuous Infusions: ? lactated ringers  100 mL/hr at 08/03/21 4503  ? vancomycin Stopped (08/03/21 8882)  ? ?PRN Meds:.ketorolac, LORazepam ? ?Allergies  ?Allergen Reactions  ? Naltrexone Anxiety  ?  Severe anxiety, restlessness, vomiting  ? ?Social History  ? ?Socioeconomic History  ? Marital status: Single  ?  Spouse name: Not on file  ? Number of children: Not on file  ? Years of education: Not on file  ? Highest education level: Not on file  ?Occupational History  ? Not on file  ?Tobacco Use  ? Smoking status: Light Smoker  ?  Types: Cigarettes  ? Smokeless tobacco: Never  ?Vaping Use  ? Vaping Use: Never used  ?Substance and Sexual Activity  ?  Alcohol use: Yes  ? Drug use: Yes  ?  Types: Heroin  ?  Comment: Heroin   ? Sexual activity: Yes  ?Other Topics Concern  ? Not on file  ?Social History Narrative  ? Not on file  ? ?Social Determinants of Health  ? ?Financial Resource Strain: Not on file  ?Food Insecurity: Not on file  ?Transportation Needs: Not on file  ?Physical Activity: Not on file  ?Stress: Not on file  ?Social Connections: Not on file  ?Intimate Partner Violence: Not on file  ? ?Breast Cancer-relatedfamily history is not on file. ? ?Vitals ?BP 98/67   Pulse 78   Temp 97.7 ?F (36.5 ?C) (Oral)   Resp (!) 23   Ht '5\' 6"'  (1.676 m)   Wt 83 kg   SpO2 96%   BMI 29.53 kg/m?  ? ? ?Physical Exam ?Constitutional: lying in the bed, looks drowsy but arousable to voice ?   Comments:  ? ?Cardiovascular:  ?   Rate and Rhythm: Normal rate and regular rhythm.  ?   Heart sounds: ? ?Pulmonary:  ?   Effort: Pulmonary effort is normal.  ?   Comments: Coarse breath sounds ? ?Abdominal:  ?   Palpations: Abdomen is soft.  ?   Tenderness: Generalized abdominal tenderness, no rebound tenderness or rigidity ? ?Musculoskeletal:     ?   General: No swelling or tenderness.  Back could not be examined as patient could not turn or sit up ? ? ? ?Skin: ?   Comments:  ? ?Neurological:  ?   General: Grossly nonfocal, awake alert and oriented ? ? ?Pertinent Microbiology ?Results for orders placed or performed during the hospital encounter of 08/02/21  ?Blood culture (routine x 2)     Status: None (Preliminary result)  ? Collection Time: 08/02/21  2:40 PM  ? Specimen: BLOOD  ?Result Value Ref Range Status  ? Specimen Description   Final  ?  BLOOD LEFT ANTECUBITAL ?Performed at [facility], [place] [address], [place], [place] [address] ?  ? Special Requests   Final  ?  BOTTLES DRAWN AEROBIC AND ANAEROBIC Blood Culture results may not be optimal due to an excessive volume of blood received in culture bottles ?Performed at [facility], [place]  [address], [place], [place] [address] ?  ? Culture  Setup Time   Final  ?  GRAM POSITIVE COCCI IN CLUSTERS ?IN BOTH AEROBIC AND ANAEROBIC BOTTLES ?CRITICAL RESULT CALLED TO, READ BACK BY AND VERIFIED WITH: PHARMD TERRY GREEN 08/03/21 @ 9:31 BY DRT ?Performed at [facility], [place] [address], [place], [place] [address] ?  ? Culture GRAM POSITIVE COCCI  Final  ? Report Status PENDING  Incomplete  ?Blood Culture ID Panel (Reflexed)  Status: Abnormal  ? Collection Time: 08/02/21  2:40 PM  ?Result Value Ref Range Status  ? Enterococcus faecalis NOT DETECTED NOT DETECTED Final  ? Enterococcus Faecium NOT DETECTED NOT DETECTED Final  ? Listeria monocytogenes NOT DETECTED NOT DETECTED Final  ? Staphylococcus species DETECTED (A) NOT DETECTED Final  ?  Comment: CRITICAL RESULT CALLED TO, READ BACK BY AND VERIFIED WITH: ?PHARMD TERRY GREEN 08/03/21 @ 9:32 BY DRT ?  ? Staphylococcus aureus (BCID) DETECTED (A) NOT DETECTED Final  ?  Comment: Methicillin (oxacillin)-resistant Staphylococcus aureus (MRSA). MRSA is predictably resistant to beta-lactam antibiotics (except ceftaroline). Preferred therapy is vancomycin unless clinically contraindicated. Patient requires contact precautions if  ?hospitalized. ?CRITICAL RESULT CALLED TO, READ BACK BY AND VERIFIED WITH: ?PHARMD TERRY GREEN 08/03/21 @ 9:32 BY DRT ?  ? Staphylococcus epidermidis NOT DETECTED NOT DETECTED Final  ? Staphylococcus lugdunensis NOT DETECTED NOT DETECTED Final  ? Streptococcus species NOT DETECTED NOT DETECTED Final  ? Streptococcus agalactiae NOT DETECTED NOT DETECTED Final  ? Streptococcus pneumoniae NOT DETECTED NOT DETECTED Final  ? Streptococcus pyogenes NOT DETECTED NOT DETECTED Final  ? A.calcoaceticus-baumannii NOT DETECTED NOT DETECTED Final  ? Bacteroides fragilis NOT DETECTED NOT DETECTED Final  ? Enterobacterales NOT DETECTED NOT DETECTED Final  ? Enterobacter cloacae complex NOT DETECTED NOT DETECTED Final  ? Escherichia coli NOT DETECTED NOT  DETECTED Final  ? Klebsiella aerogenes NOT DETECTED NOT DETECTED Final  ? Klebsiella oxytoca NOT DETECTED NOT DETECTED Final  ? Klebsiella pneumoniae NOT DETECTED NOT DETECTED Final  ? Proteus species NOT DETEC

## 2021-08-03 NOTE — Progress Notes (Signed)
Refusing CT scan, MD notified ?

## 2021-08-03 NOTE — Progress Notes (Signed)
?PROGRESS NOTE ? ? ? ?Latoya [name]  RJJ:[id number] DOB: June 04, 1985 DOA: 08/02/2021 ?PCP: Pcp, No  ? ? ?Brief Narrative:  ?Latoya [name] is a 36 y.o. female with medical history significant for history of IV drug abuse, heroin abuse, history of MSSA tricuspid valve endocarditis in 2020 treated with ancef and angiojet at [facility], h/o MRSA diskitis / osteomyelitis of L spine in 2021 presented to hospital with complaints of severe low back pain for the last couple of weeks with no other symptoms.  Did complain of subjective fevers at home.  In the ED, patient was noted to have hyponatremia, hypokalemia significant leukocytosis and neutrophilia.  Chest x-ray showed new patchy opacities in the right mid fields and left lower fields.  Urinalysis was positive for 21-50 white cells but nitrite negative.  COVID and influenza was negative.  MRSA PCR was positive.  Patient was then admitted hospital for sepsis with hypotension, multifocal pneumonia. ?  ?Assessment and Plan: ?Sepsis (HCC) ?Secondary to multifocal pneumonia.  Patient had elevated lactate, history of IV drug abuse.  Patient received sepsis protocol including IV fluid boluses.  Blood cultures showing MRSA.  We will check 2D echocardiogram.  Was initially on empiric vancomycin cefepime and Flagyl.  MRSA PCR positive.  We will continue with IV vancomycin for now due to MRSA in blood cultures. ? ?MRSA sepsis.  4/ 4 bottles positive for MRSA.  Check 2D echocardiogram.  History of IV drug abuse.  Communicated with infectious disease for consultation. ? ?History of infective endocarditis in 2020 status post angio VAC, discitis of the lumbar spine.  MRI of the lumbar spine negative for discitis.  Check 2D echocardiogram.   ? ?Opioid use disorder ?suboxone protocol we will continue while in the hospital. ?  ?IV drug abuse (HCC) ?Counseling provided. ?  ?Anemia of infection and chronic disease ?Continue to monitor CBC.  Latest hemoglobin of 8.2. ? ?Hypokalemia.  Potassium  level of of 2.8.  Will replace orally.  Check levels in a.m. check magnesium level in AM.  ? ? ? DVT prophylaxis: enoxaparin (LOVENOX) injection 40 mg Start: 08/02/21 2200 ? ? ?Code Status:   ?  Code Status: Full Code ? ?Disposition: Home ? ?Status is: Inpatient ? ?Remains inpatient appropriate because: MRSA sepsis, IV antibiotic ? ? Family Communication:  ?Spoke with the patient at bedside. ? ?Consultants:  ?Infectious disease ? ?Procedures:  ?None ? ?Antimicrobials:  ?Vancomycin ? ?Anti-infectives (From admission, onward)  ? ? Start     Dose/Rate Route Frequency Ordered Stop  ? 08/03/21 1700  vancomycin (VANCOCIN) IVPB 1000 mg/200 mL premix  Status:  Discontinued       ? 1,000 mg ?200 mL/hr over 60 Minutes Intravenous Every 12 hours 08/03/21 1039 08/03/21 1042  ? 08/03/21 1700  Vancomycin (VANCOCIN) 1,250 mg in sodium chloride 0.9 % 250 mL IVPB  Status:  Discontinued       ? 1,250 mg ?166.7 mL/hr over 90 Minutes Intravenous Every 12 hours 08/03/21 1042 08/03/21 1111  ? 08/03/21 1700  Vancomycin (VANCOCIN) 1,250 mg in sodium chloride 0.9 % 250 mL IVPB  Status:  Discontinued       ? 1,250 mg ?166.7 mL/hr over 90 Minutes Intravenous Every 12 hours 08/03/21 1112 08/03/21 1124  ? 08/03/21 1700  Vancomycin (VANCOCIN) 1,250 mg in sodium chloride 0.9 % 250 mL IVPB       ? 1,250 mg ?175 mL/hr over 90 Minutes Intravenous Every 12 hours 08/03/21 1124    ? 08/03/21 0600  vancomycin (  VANCOCIN) IVPB 750 mg/150 ml premix  Status:  Discontinued       ? 750 mg ?150 mL/hr over 60 Minutes Intravenous Every 12 hours 08/02/21 2143 08/03/21 0020  ? 08/03/21 0600  vancomycin (VANCOREADY) IVPB 750 mg/150 mL  Status:  Discontinued       ? 750 mg ?150 mL/hr over 60 Minutes Intravenous Every 12 hours 08/03/21 0022 08/03/21 1039  ? 08/03/21 0200  ceFEPIme (MAXIPIME) 2 g in sodium chloride 0.9 % 100 mL IVPB  Status:  Discontinued       ? 2 g ?200 mL/hr over 30 Minutes Intravenous Every 8 hours 08/02/21 2138 08/03/21 0946  ? 08/02/21 2200   metroNIDAZOLE (FLAGYL) IVPB 500 mg  Status:  Discontinued       ? 500 mg ?100 mL/hr over 60 Minutes Intravenous Every 12 hours 08/02/21 2135 08/03/21 0946  ? 08/02/21 1545  vancomycin (VANCOCIN) IVPB 1000 mg/200 mL premix       ? 1,000 mg ?200 mL/hr over 60 Minutes Intravenous  Once 08/02/21 1537 08/02/21 2138  ? 08/02/21 1545  ceFEPIme (MAXIPIME) 2 g in sodium chloride 0.9 % 100 mL IVPB       ? 2 g ?200 mL/hr over 30 Minutes Intravenous  Once 08/02/21 1537 08/02/21 1933  ? ?  ? ?Subjective: ?Today, patient was seen and examined at bedside.  Complains of shortness of breath and chest pain on taking deep breath with cough. ? ?Objective: ?Vitals:  ? 08/03/21 0600 08/03/21 0700 08/03/21 0800 08/03/21 1036  ?BP: 109/72 98/67    ?Pulse: 84 78    ?Resp: (!) 28 (!) 23    ?Temp:   97.7 ?F (36.5 ?C)   ?TempSrc:   Oral   ?SpO2: 96% 96%    ?Weight:    82.2 kg  ?Height:      ? ? ?Intake/Output Summary (Last 24 hours) at 08/03/2021 1240 ?Last data filed at 08/03/2021 1214 ?Gross per 24 hour  ?Intake 4528.9 ml  ?Output 550 ml  ?Net 3978.9 ml  ? ?Filed Weights  ? 08/02/21 1412 08/02/21 2318 08/03/21 1036  ?Weight: 68 kg 83 kg 82.2 kg  ? ? ?Physical Examination: ?General:  Average built, not in obvious distress, mildly anxious ?HENT:   Mild pallor noted.  Oral mucosa is moist.  ?Chest:   Diminished breath sounds bilaterally.  No obvious wheezes. ?CVS: S1 &S2 heard. No murmur.  Regular rate and rhythm. ?Abdomen: Soft, nontender, nondistended.  Bowel sounds are heard.   ?Extremities: No cyanosis, clubbing or extremity with injection marks/scab  ?Psych: Alert, awake and oriented, normal mood ?CNS:  No cranial nerve deficits.  Power equal in all extremities.   ?Skin: Warm and dry.  Lower extremity with scabs. ? ?Data Reviewed:  ? ?CBC: ?Recent Labs  ?Lab 08/02/21 ?1442 08/03/21 ?9563  ?WBC 31.7* 24.0*  ?NEUTROABS 27.6*  --   ?HGB 10.4* 8.2*  ?HCT 30.8* 25.6*  ?MCV 69.2* 70.3*  ?PLT 199 170  ? ? ?Basic Metabolic Panel: ?Recent Labs  ?Lab  08/02/21 ?1442 08/03/21 ?8756  ?NA 127* 129*  ?K 2.4* 2.8*  ?CL 93* 101  ?CO2 21* 20*  ?GLUCOSE 108* 131*  ?BUN 25* 22*  ?CREATININE 0.97 0.82  ?CALCIUM 7.8* 7.8*  ?MG 2.1  --   ? ? ?Liver Function Tests: ?Recent Labs  ?Lab 08/02/21 ?1442  ?AST 53*  ?ALT 19  ?ALKPHOS 77  ?BILITOT 0.8  ?PROT 8.2*  ?ALBUMIN 2.7*  ? ? ? ?Radiology Studies: ?MR Lumbar  Spine W Wo Contrast ? ?Result Date: 08/02/2021 ?CLINICAL DATA:  Low back pain. Infection suspected. Rule out discitis or abscess. EXAM: MRI LUMBAR SPINE WITHOUT AND WITH CONTRAST TECHNIQUE: Multiplanar and multiecho pulse sequences of the lumbar spine were obtained without and with intravenous contrast. CONTRAST:  7mL GADAVIST GADOBUTROL 1 MMOL/ML IV SOLN COMPARISON:  MRI 07/28/2019 FINDINGS: Segmentation: There are five lumbar type vertebral bodies. The last full intervertebral disc space is labeled L5-S1. This correlates with the prior study. Alignment:  Normal Vertebrae: Normal marrow signal. No bone lesions or fractures. No MR findings suspicious for discitis or osteomyelitis. Conus medullaris and cauda equina: Conus extends to the L1 level. Conus and cauda equina appear normal. Paraspinal and other soft tissues: No significant paraspinal or retroperitoneal findings. Disc levels: No lumbar disc protrusions, spinal or foraminal stenosis. IMPRESSION: Unremarkable lumbar spine MRI. No MR findings suspicious for discitis or osteomyelitis. Electronically Signed   By: Rudie MeyerP.  Gallerani M.D.   On: 08/02/2021 20:56  ? ?DG Chest Port 1 View ? ?Result Date: 08/02/2021 ?CLINICAL DATA:  Chest pain EXAM: PORTABLE CHEST 1 VIEW COMPARISON:  03/24/2021 FINDINGS: Cardiac size is within normal limits. There are no signs of pulmonary edema. There are new patchy infiltrates in the left upper right mid and left lower lung fields. There is no significant pleural effusion or pneumothorax. IMPRESSION: There are new patchy infiltrates in the right mid lung fields, left upper and left lower lung  fields suggesting possible multifocal pneumonia. Follow-up studies should be considered to assess resolution. Electronically Signed   By: Ernie AvenaPalani  Rathinasamy M.D.   On: 08/02/2021 14:52   ? ? ? LOS: 1 day  ? ?

## 2021-08-03 NOTE — Progress Notes (Signed)
PHARMACY - PHYSICIAN COMMUNICATION ?CRITICAL VALUE ALERT - BLOOD CULTURE IDENTIFICATION (BCID) ? ?Latoya [name] is an 36 y.o. female who presented to [facility] on 08/02/2021. History significant for ongoing IVDU. She has history of MSSA endocarditis, discitis and osteomyelitis. ? ?Assessment:  4/4 bottles MRSA ? ?Name of physician (or Provider) Contacted: Dr. Tyson Babinski ? ?Current antibiotics: vancomycin, cefepime, metronidazole ? ?Changes to prescribed antibiotics recommended: narrow to vancomycin, automatic ID consult ? ?Results for orders placed or performed during the hospital encounter of 08/02/21  ?Blood Culture ID Panel (Reflexed) (Collected: 08/02/2021  2:40 PM)  ?Result Value Ref Range  ? Enterococcus faecalis NOT DETECTED NOT DETECTED  ? Enterococcus Faecium NOT DETECTED NOT DETECTED  ? Listeria monocytogenes NOT DETECTED NOT DETECTED  ? Staphylococcus species DETECTED (A) NOT DETECTED  ? Staphylococcus aureus (BCID) DETECTED (A) NOT DETECTED  ? Staphylococcus epidermidis NOT DETECTED NOT DETECTED  ? Staphylococcus lugdunensis NOT DETECTED NOT DETECTED  ? Streptococcus species NOT DETECTED NOT DETECTED  ? Streptococcus agalactiae NOT DETECTED NOT DETECTED  ? Streptococcus pneumoniae NOT DETECTED NOT DETECTED  ? Streptococcus pyogenes NOT DETECTED NOT DETECTED  ? A.calcoaceticus-baumannii NOT DETECTED NOT DETECTED  ? Bacteroides fragilis NOT DETECTED NOT DETECTED  ? Enterobacterales NOT DETECTED NOT DETECTED  ? Enterobacter cloacae complex NOT DETECTED NOT DETECTED  ? Escherichia coli NOT DETECTED NOT DETECTED  ? Klebsiella aerogenes NOT DETECTED NOT DETECTED  ? Klebsiella oxytoca NOT DETECTED NOT DETECTED  ? Klebsiella pneumoniae NOT DETECTED NOT DETECTED  ? Proteus species NOT DETECTED NOT DETECTED  ? Salmonella species NOT DETECTED NOT DETECTED  ? Serratia marcescens NOT DETECTED NOT DETECTED  ? Haemophilus influenzae NOT DETECTED NOT DETECTED  ? Neisseria meningitidis NOT DETECTED NOT DETECTED  ?  Pseudomonas aeruginosa NOT DETECTED NOT DETECTED  ? Stenotrophomonas maltophilia NOT DETECTED NOT DETECTED  ? Candida albicans NOT DETECTED NOT DETECTED  ? Candida auris NOT DETECTED NOT DETECTED  ? Candida glabrata NOT DETECTED NOT DETECTED  ? Candida krusei NOT DETECTED NOT DETECTED  ? Candida parapsilosis NOT DETECTED NOT DETECTED  ? Candida tropicalis NOT DETECTED NOT DETECTED  ? Cryptococcus neoformans/gattii NOT DETECTED NOT DETECTED  ? Meth resistant mecA/C and MREJ DETECTED (A) NOT DETECTED  ? ? ?Pricilla Riffle, PharmD, BCPS ?Clinical Pharmacist ?08/03/2021 9:49 AM ? ? ?

## 2021-08-03 NOTE — Progress Notes (Signed)
Pharmacy Antibiotic Note ? ?Latoya [name] is a 36 y.o. female admitted on 08/02/2021 with MRSA bacteremia. Pharmacy has been consulted for Vancomycin dosing. ? ?Noted weight adjusted from 68 kg to 83 kg, had RN confirm since large jump and still at 82.2 kg this AM. Will adjust the Vancomycin dose appropriately.  ? ?Old records indicate a therapeutic AUC on a dose of 1250 mg/12h with SCr closer to 0.6 - given improving renal function will expect further trends down. ? ?Plan: ?- Adjust Vancomycin to 1250 mg IV every 12 hours (eAUC 531, Vd 0.72, SCr 0.82) ?- Will continue to follow renal function, culture results, LOT, and antibiotic de-escalation plans  ? ?Height: 5\' 6"  (167.6 cm) ?Weight: 83 kg (182 lb 15.7 oz) ?IBW/kg (Calculated) : 59.3 ? ?Temp (24hrs), Avg:98.3 ?F (36.8 ?C), Min:97.7 ?F (36.5 ?C), Max:99.4 ?F (37.4 ?C) ? ?Recent Labs  ?Lab 08/02/21 ?1442 08/02/21 ?1710 08/03/21 ?[id number]  ?WBC 31.7*  --  24.0*  ?CREATININE 0.97  --  0.82  ?LATICACIDVEN 2.2* 1.8  --   ?  ?Estimated Creatinine Clearance: 105 mL/min (by C-G formula based on SCr of 0.82 mg/dL).   ? ?Allergies  ?Allergen Reactions  ? Naltrexone Anxiety  ?  Severe anxiety, restlessness, vomiting  ? ? ?Antimicrobials this admission: ?Cefepime 3/29 >> 3/30 ?Flagyl 3/29 >> 3/30 ?Vancomycin 3/29 >> ? ?Dose adjustments this admission: ? ? ?Microbiology results: ?3/29 Influenza/COVID>> neg ?3/29 MRSA PCR >> positive ?3/29 BCx >> GPC in 4/4 bottles (BCID MRSA) ? ?Thank you for allowing pharmacy to be a part of this patient?s care. ? ?Alycia Rossetti, PharmD, BCPS ?Infectious Diseases Clinical Pharmacist ?08/03/2021 10:54 AM  ? ?**Pharmacist phone directory can now be found on [email or web address] ([email or web address]).  Listed under [place].  ? ?

## 2021-08-03 NOTE — TOC Initial Note (Signed)
Transition of Care (TOC) - Initial/Assessment Note  ? ? ?Patient Details  ?Name: Latoya [name] ?MRN: [id number] ?Date of Birth: 07-Oct-1985 ? ?Transition of Care (TOC) CM/SW Contact:    ?Ida Rogue, LCSW ?Phone Number: ?08/03/2021, 3:34 PM ? ?Clinical Narrative:      Patient seen in follow up to MD consult for SA.  Ms Latoya [name] was engageable, anxious.  Told me she gets anxious when she feels it is time for meds and none are given.  I let her nurse know when we were done that she is asking if she is due for any meds.  Furthermore, [name] is anxious about her ability to cease heroin/opiate use.  She has not previously tried to quit.  She expressed interest in getting into a 28 day rehab program. I will check to see if it is possible for her to get into [facility] rehab. I was able to confirm, with help of financial navigator, that he MCD is linked with [facility], thus paving the way for her to be reviewed for possible admission.  Referral FAXed. TOC will continue to follow during the course of hospitalization. ?           ? ? ?Expected Discharge Plan:  (Unknown) ?Barriers to Discharge: Continued Medical Work up ? ? ?Patient Goals and CMS Choice ?Patient states their goals for this hospitalization and ongoing recovery are:: "I want to get into rehab" ?  ?  ? ?Expected Discharge Plan and Services ?Expected Discharge Plan:  (Unknown) ?In-house Referral: Clinical Social Work ?  ?  ?Living arrangements for the past 2 months: No permanent address ?                ?  ?  ?  ?  ?  ?  ?  ?  ?  ?  ? ?Prior Living Arrangements/Services ?Living arrangements for the past 2 months: No permanent address ?Lives with:: Self ?Patient language and need for interpreter reviewed:: Yes ?       ?Need for Family Participation in Patient Care: Yes (Comment) ?Care giver support system in place?: No (comment) ?  ?Criminal Activity/Legal Involvement Pertinent to Current Situation/Hospitalization: No - Comment as needed ? ?Activities of  Daily Living ?  ?  ? ?Permission Sought/Granted ?  ?  ?   ?   ?   ?   ? ?Emotional Assessment ?Appearance:: Appears stated age ?Attitude/Demeanor/Rapport: Engaged ?Affect (typically observed): Anxious ?Orientation: : Oriented to Self, Oriented to Situation, Oriented to Place ?Alcohol / Substance Use: Illicit Drugs (Heroin/opiates) ?Psych Involvement: No (comment) ? ?Admission diagnosis:  Hypokalemia [E87.6] ?Pulmonary infiltrate [R91.8] ?Other elevated white blood cell count [D72.828] ?Intravenous drug abuse (HCC) [F19.10] ?Elevated lactic acid level [R79.89] ?Sepsis (HCC) [A41.9] ?Skin lesions [L98.9] ?Acute midline low back pain without sciatica [M54.50] ?Multifocal pneumonia [J18.9] ?Patient Active Problem List  ? Diagnosis Date Noted  ? History of bacterial endocarditis 08/02/2021  ? Microcytic hypochromic anemia 08/03/2019  ? MRSA (methicillin resistant staph aureus) culture positive 08/03/2019  ? Infection of lumbar spine (HCC) 08/03/2019  ? Lumbar discitis 07/31/2019  ? Sepsis (HCC) 07/29/2019  ? IV drug abuse (HCC) 07/29/2019  ? Opioid use disorder 04/05/2019  ? HCV antibody positive 04/05/2019  ? Anemia of infection and chronic disease 01/10/2019  ? ?PCP:  Pcp, No ?Pharmacy:   ?[facility] ?[address] ?[place] [address] ?Phone: [phone] Fax: [phone] ? ? ? ? ?Social Determinants of Health (SDOH) Interventions ?  ? ?  Readmission Risk Interventions ?   ? View : No data to display.  ?  ?  ?  ? ? ? ?

## 2021-08-04 ENCOUNTER — Inpatient Hospital Stay (HOSPITAL_COMMUNITY): Payer: Medicaid Other

## 2021-08-04 DIAGNOSIS — D638 Anemia in other chronic diseases classified elsewhere: Secondary | ICD-10-CM | POA: Diagnosis not present

## 2021-08-04 DIAGNOSIS — I079 Rheumatic tricuspid valve disease, unspecified: Secondary | ICD-10-CM

## 2021-08-04 DIAGNOSIS — M40204 Unspecified kyphosis, thoracic region: Secondary | ICD-10-CM | POA: Diagnosis not present

## 2021-08-04 DIAGNOSIS — B999 Unspecified infectious disease: Secondary | ICD-10-CM | POA: Diagnosis not present

## 2021-08-04 DIAGNOSIS — E871 Hypo-osmolality and hyponatremia: Secondary | ICD-10-CM | POA: Diagnosis present

## 2021-08-04 DIAGNOSIS — Z8679 Personal history of other diseases of the circulatory system: Secondary | ICD-10-CM | POA: Diagnosis not present

## 2021-08-04 DIAGNOSIS — J90 Pleural effusion, not elsewhere classified: Secondary | ICD-10-CM | POA: Diagnosis not present

## 2021-08-04 DIAGNOSIS — R918 Other nonspecific abnormal finding of lung field: Secondary | ICD-10-CM | POA: Diagnosis not present

## 2021-08-04 HISTORY — DX: Hypo-osmolality and hyponatremia: E87.1

## 2021-08-04 LAB — CBC
HCT: 26.4 % — ABNORMAL LOW (ref 36.0–46.0)
Hemoglobin: 8.2 g/dL — ABNORMAL LOW (ref 12.0–15.0)
MCH: 22.6 pg — ABNORMAL LOW (ref 26.0–34.0)
MCHC: 31.1 g/dL (ref 30.0–36.0)
MCV: 72.7 fL — ABNORMAL LOW (ref 80.0–100.0)
Platelets: 211 10*3/uL (ref 150–400)
RBC: 3.63 MIL/uL — ABNORMAL LOW (ref 3.87–5.11)
RDW: 20.1 % — ABNORMAL HIGH (ref 11.5–15.5)
WBC: 22 10*3/uL — ABNORMAL HIGH (ref 4.0–10.5)
nRBC: 0 % (ref 0.0–0.2)

## 2021-08-04 LAB — COMPREHENSIVE METABOLIC PANEL
ALT: 15 U/L (ref 0–44)
AST: 30 U/L (ref 15–41)
Albumin: 2 g/dL — ABNORMAL LOW (ref 3.5–5.0)
Alkaline Phosphatase: 60 U/L (ref 38–126)
Anion gap: 5 (ref 5–15)
BUN: 13 mg/dL (ref 6–20)
CO2: 20 mmol/L — ABNORMAL LOW (ref 22–32)
Calcium: 7.5 mg/dL — ABNORMAL LOW (ref 8.9–10.3)
Chloride: 105 mmol/L (ref 98–111)
Creatinine, Ser: 0.65 mg/dL (ref 0.44–1.00)
GFR, Estimated: >60 mL/min (ref >60)
Glucose, Bld: 95 mg/dL (ref 70–99)
Potassium: 4.2 mmol/L (ref 3.5–5.1)
Sodium: 130 mmol/L — ABNORMAL LOW (ref 135–145)
Total Bilirubin: 0.7 mg/dL (ref 0.3–1.2)
Total Protein: 6.3 g/dL — ABNORMAL LOW (ref 6.5–8.1)

## 2021-08-04 LAB — BLOOD GAS, ARTERIAL
Acid-Base Excess: 0.4 mmol/L (ref 0.0–2.0)
Bicarbonate: 22.6 mmol/L (ref 20.0–28.0)
O2 Saturation: 96.9 %
Patient temperature: 38.1
pCO2 arterial: 30 mmHg — ABNORMAL LOW (ref 32–48)
pH, Arterial: 7.48 — ABNORMAL HIGH (ref 7.35–7.45)
pO2, Arterial: 69 mmHg — ABNORMAL LOW (ref 83–108)

## 2021-08-04 LAB — HEPATITIS B CORE ANTIBODY, TOTAL: Hep B Core Total Ab: NONREACTIVE

## 2021-08-04 LAB — HEPATITIS B SURFACE ANTIGEN: Hepatitis B Surface Ag: NONREACTIVE

## 2021-08-04 LAB — VANCOMYCIN, PEAK: Vancomycin Pk: 21 ug/mL — ABNORMAL LOW (ref 30–40)

## 2021-08-04 LAB — MAGNESIUM: Magnesium: 2.1 mg/dL (ref 1.7–2.4)

## 2021-08-04 IMAGING — MR MR THORACIC SPINE WO/W CM
7 of 12 series · 23 of 48 positions shown · IV contrast (gadavist)
Comparison: Comparison made with MRI of the lumbar spine from
[DATE].

CLINICAL DATA: Initial evaluation for osteomyelitis.

EXAM:
MRI THORACIC WITHOUT AND WITH CONTRAST
TECHNIQUE: Multiplanar and multiecho pulse sequences of the thoracic spine were
obtained without and with intravenous contrast.
CONTRAST:  8mL GADAVIST GADOBUTROL 1 MMOL/ML IV SOLN

[Series 16: T1 · sagittal · 4.0mm · 1.72mm/px · 2 of 5 slices shown (1 of 5)]
[im 1/5]
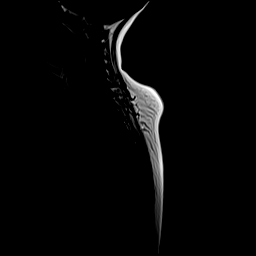
[im 5/5]
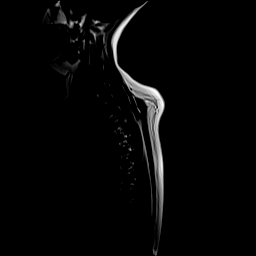

[Series 18: T1 · sagittal · 3.0mm · 1.06mm/px · 2 of 15 slices shown (2 of 5)]
[im 1/15]
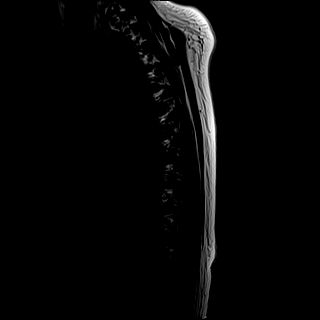
[im 15/15]
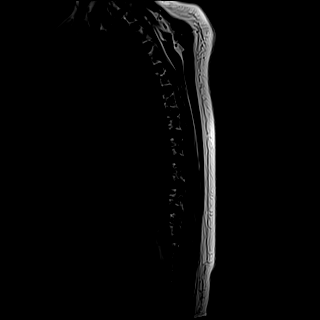

[Series 21: T1 · axial · 4.0mm · 0.39mm/px · z∈[-183,-44]mm · 5 of 28 slices shown (3 of 5)]
[im 1/28]
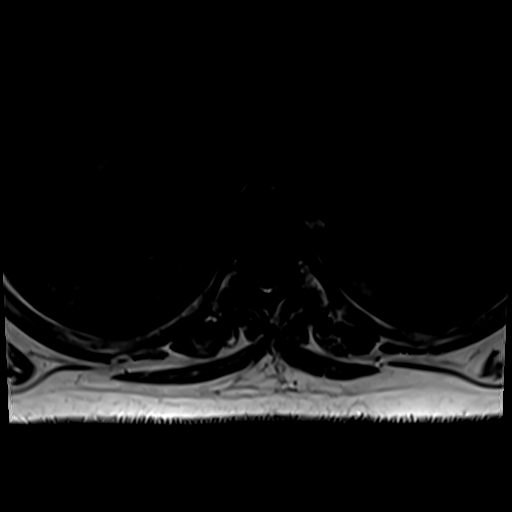
[im 7/28]
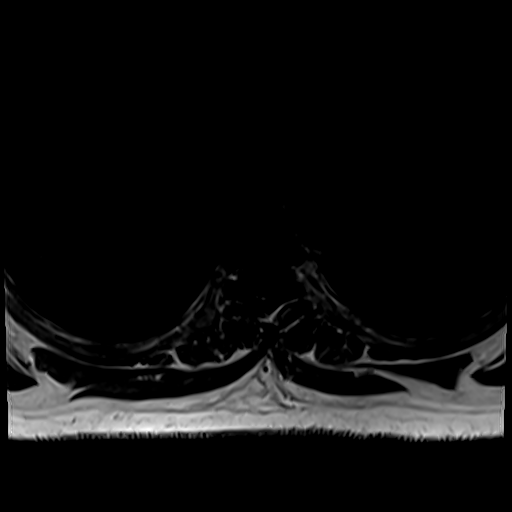
[im 14/28]
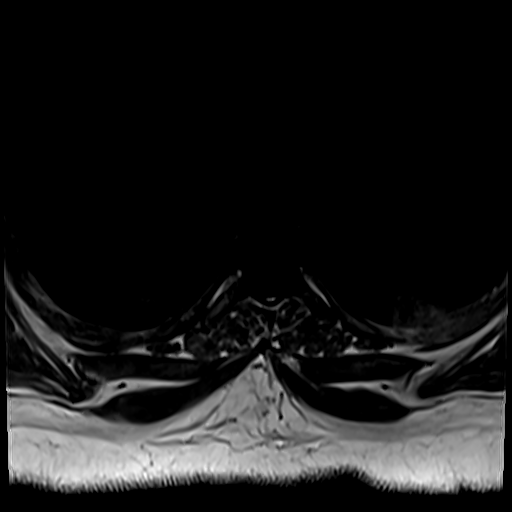
[im 21/28]
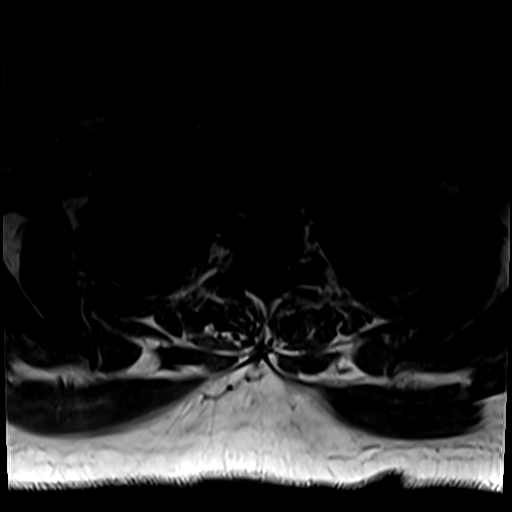
[im 28/28]
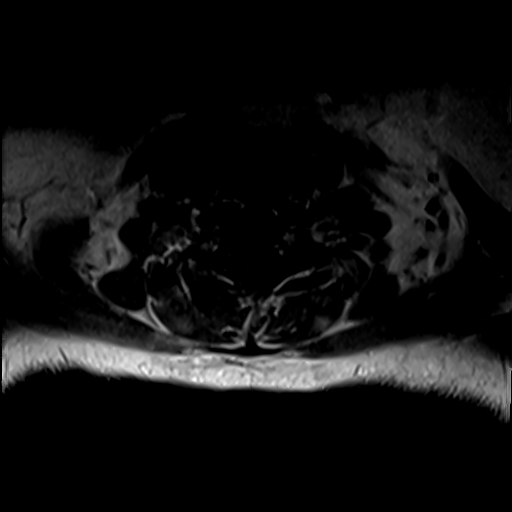

[Series 22: T1 · axial · 4.0mm · 0.39mm/px · z∈[-331,-164]mm · 5 of 32 slices shown (4 of 5)]
[im 1/32]
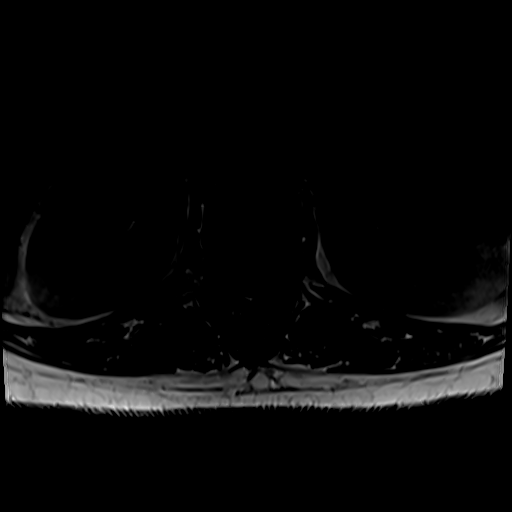
[im 8/32]
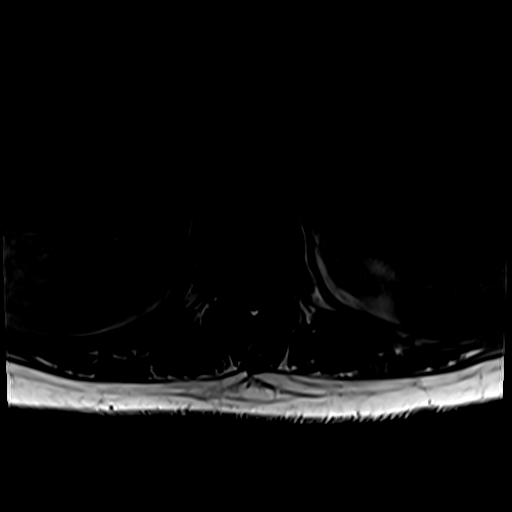
[im 16/32]
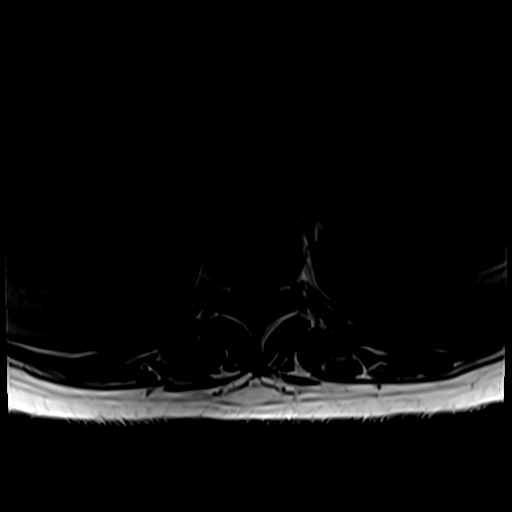
[im 24/32]
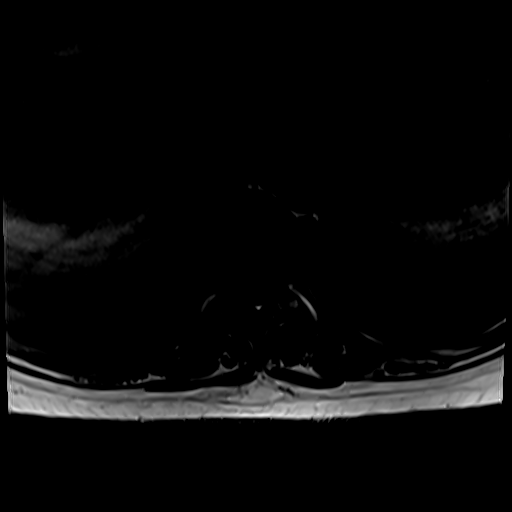
[im 32/32]
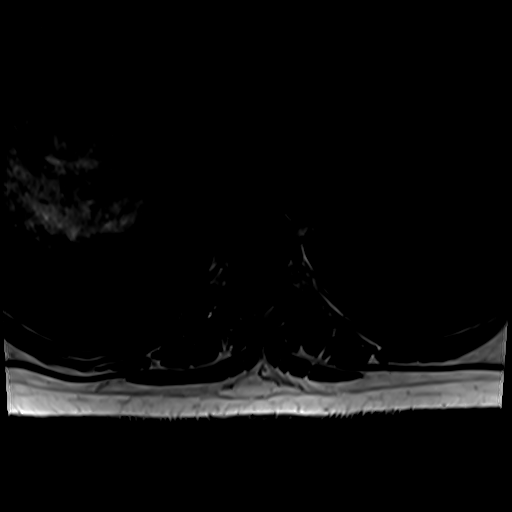

[Series 24: T1 fat-sat post-contrast · sagittal · 3.0mm · 1.06mm/px · 2 of 15 slices shown]
[im 1/15]
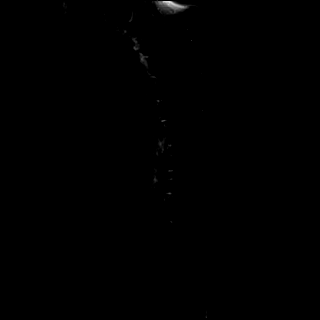
[im 15/15]
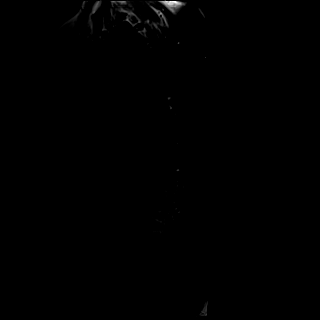

[Series 25: T1 · axial · 4.0mm · 0.39mm/px · z∈[-183,-44]mm · 5 of 28 slices shown (5 of 5)]
[im 1/28]
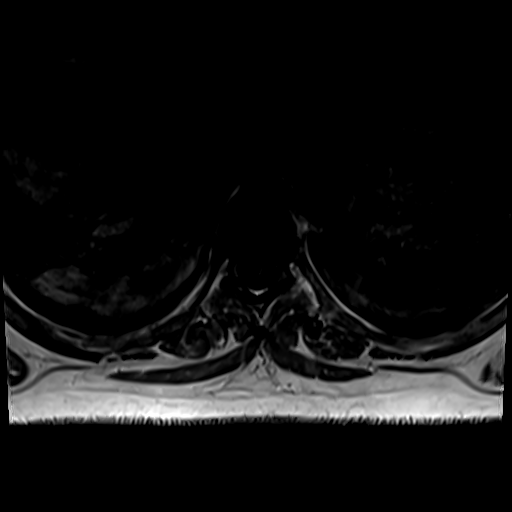
[im 7/28]
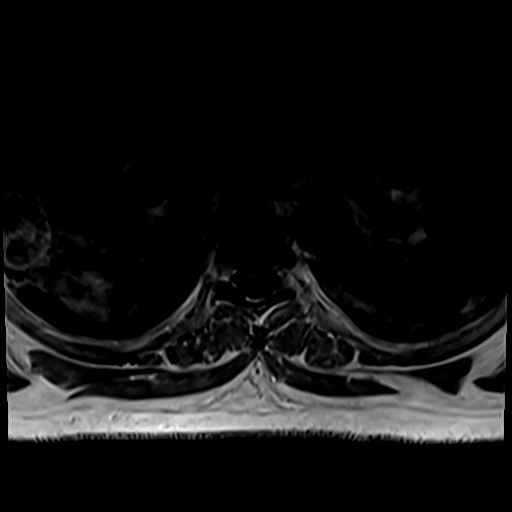
[im 14/28]
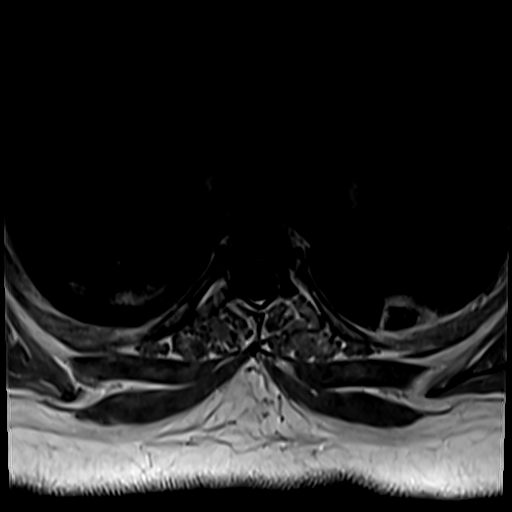
[im 21/28]
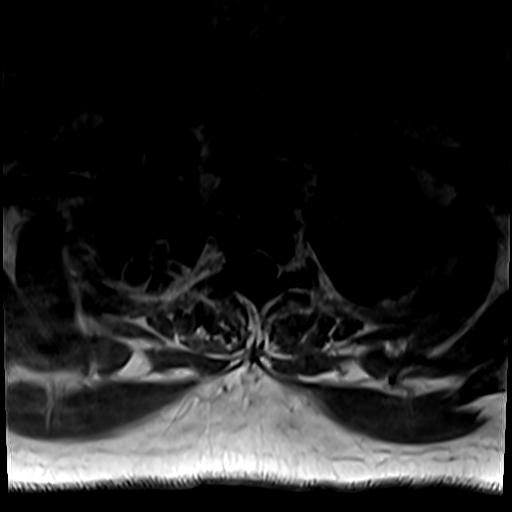
[im 28/28]
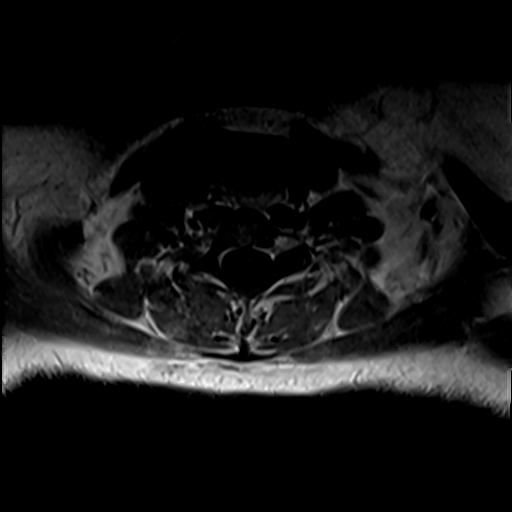

[Series 27: T2 post-contrast · sagittal · 3.0mm · 0.89mm/px · 2 of 15 slices shown]
[im 1/15]
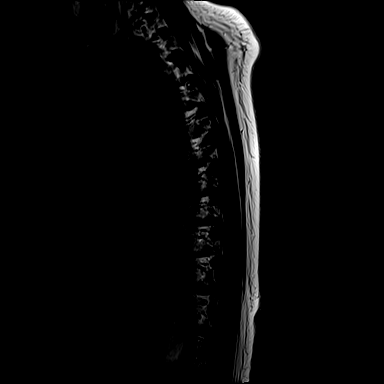
[im 15/15]
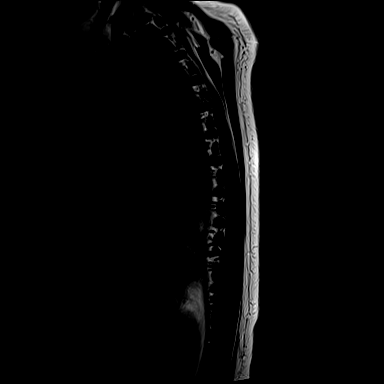

[23 of 48 positions shown; findings below may reference images not displayed]

FINDINGS: Alignment: Mild straightening of the normal thoracic kyphosis. No
listhesis.

Vertebrae: Vertebral body height maintained without acute or chronic
fracture. Diffusely decreased T1 signal intensity seen throughout
the visualized bone marrow, nonspecific, but most commonly related
to anemia, smoking, or obesity. No discrete or worrisome osseous
lesions. No abnormal marrow edema or enhancement. No evidence for
osteomyelitis discitis or septic arthritis.

Cord: Normal signal and morphology. No epidural abscess or other
collection.

Paraspinal and other soft tissues: No significant paraspinous edema.
No visible soft tissue collections. Multiple cavitary nodule seen
throughout the visualized lungs, consistent with previously
identified septic emboli. Small layering bilateral pleural
effusions, right greater than left.

Disc levels:

No significant disc pathology seen within the thoracic spine. No
disc bulge or focal disc herniation. No canal or neural foraminal
stenosis or evidence for neural impingement.
IMPRESSION: 1. No evidence for osteomyelitis discitis or septic arthritis within
the thoracic spine.
2. Multiple cavitary nodule throughout the visualized lungs,
consistent with septic emboli. Associated small layering bilateral
pleural effusions, right greater than left.
3. No significant disc pathology within the thoracic spine. No
stenosis or neural impingement.

## 2021-08-04 IMAGING — CT CT CHEST W/ CM
2 of 4 series · 15 of 36 positions shown, 18 images · IV contrast (agent unspecified)
Comparison: [DATE]

CLINICAL DATA: Evaluate for septic emboli.

EXAM:
CT CHEST WITH CONTRAST
TECHNIQUE: Multidetector CT imaging of the chest was performed during
intravenous contrast administration.

[Series 2: axial st · axial · 0.77mm/px · z∈[-270,-20]mm · 12 of 149 slices shown, 15 images]
[im 12/149  mediastinal]
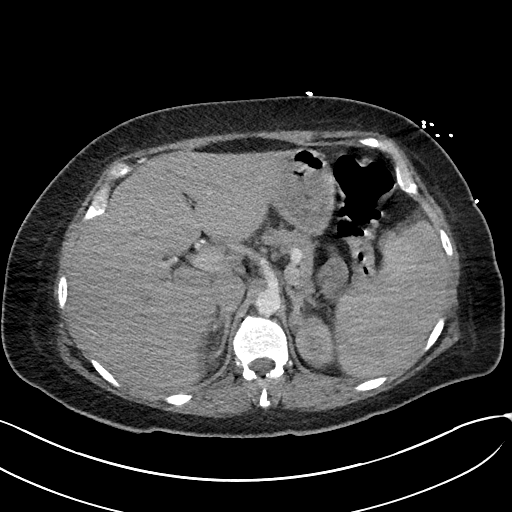
[im 12/149  lung]
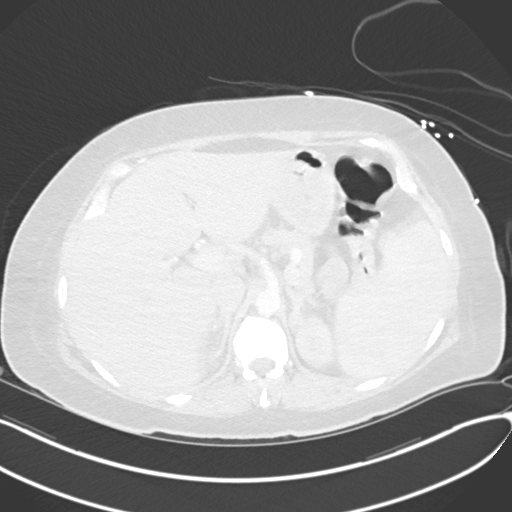
[im 23/149  lung]
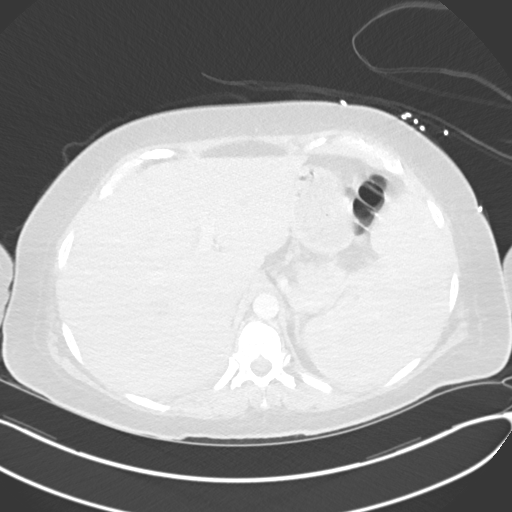
[im 35/149  lung]
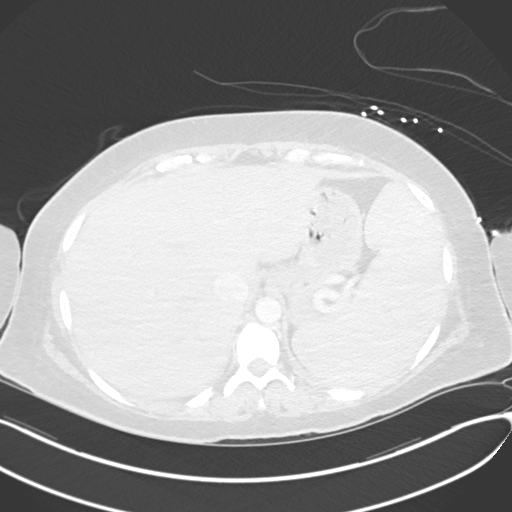
[im 46/149  lung]
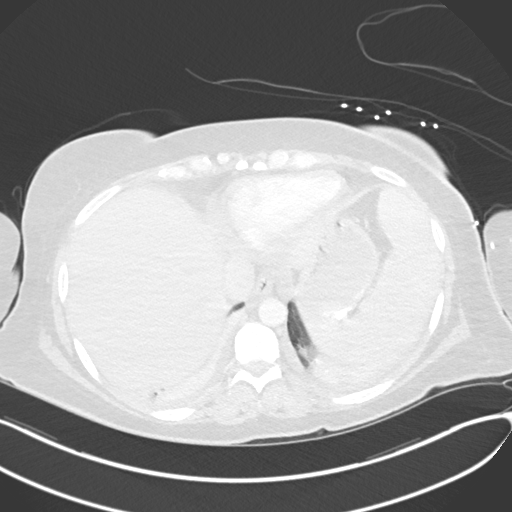
[im 57/149  mediastinal]
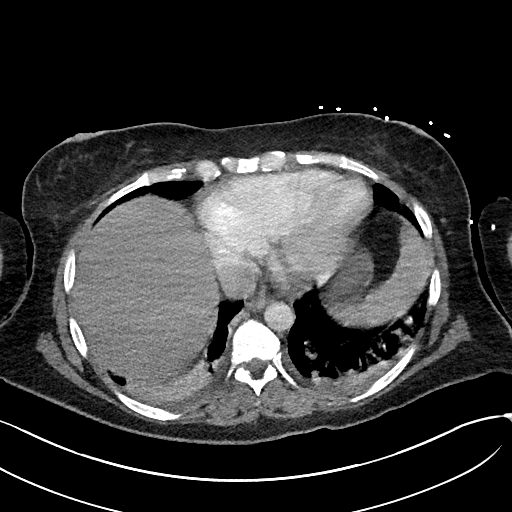
[im 57/149  lung]
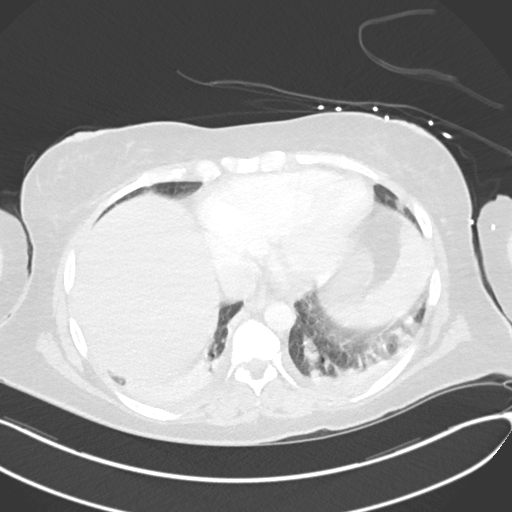
[im 69/149  lung]
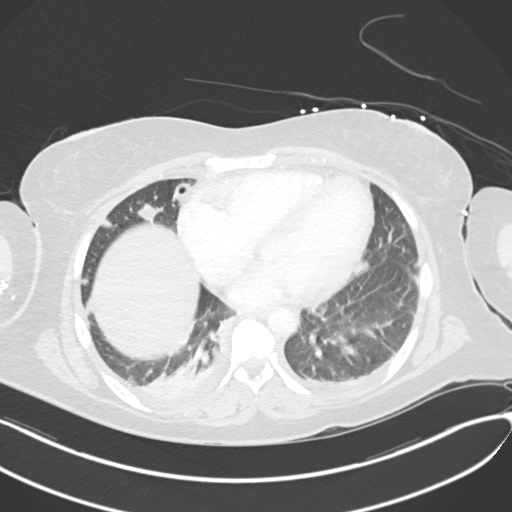
[im 80/149  lung]
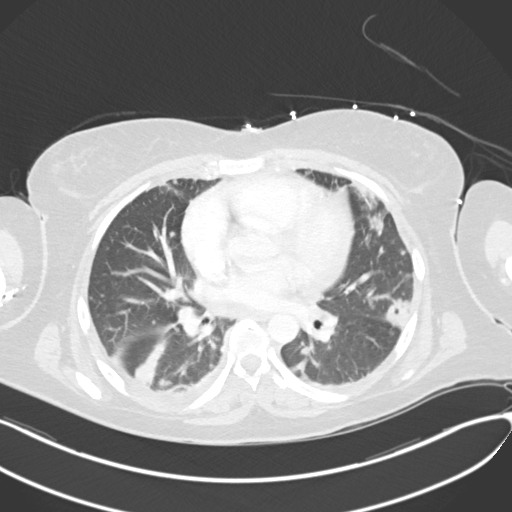
[im 92/149  lung]
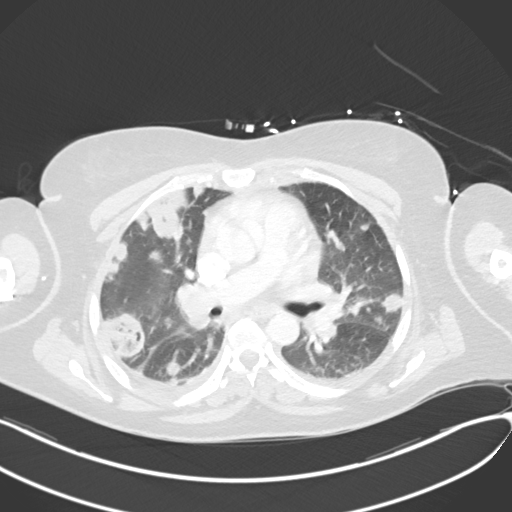
[im 103/149  mediastinal]
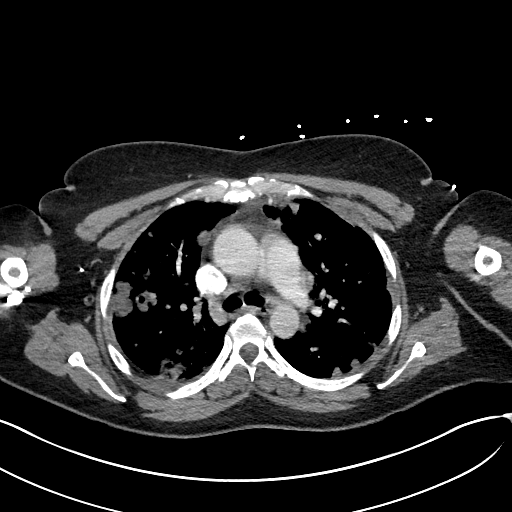
[im 103/149  lung]
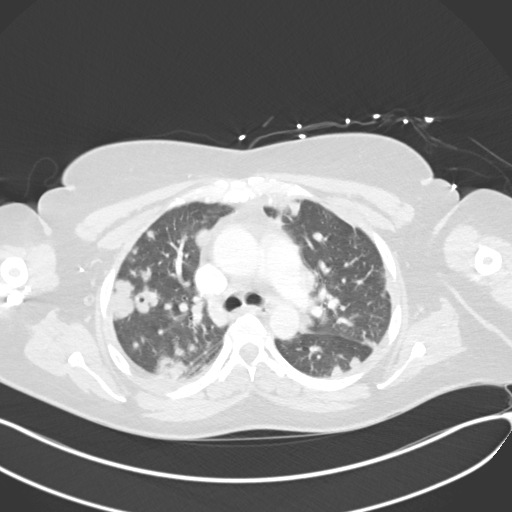
[im 114/149  lung]
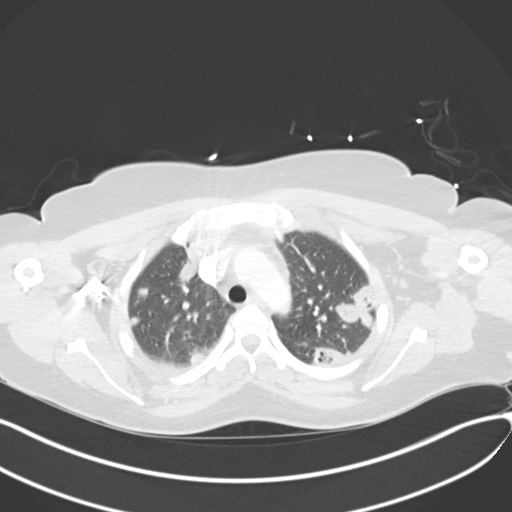
[im 126/149  lung]
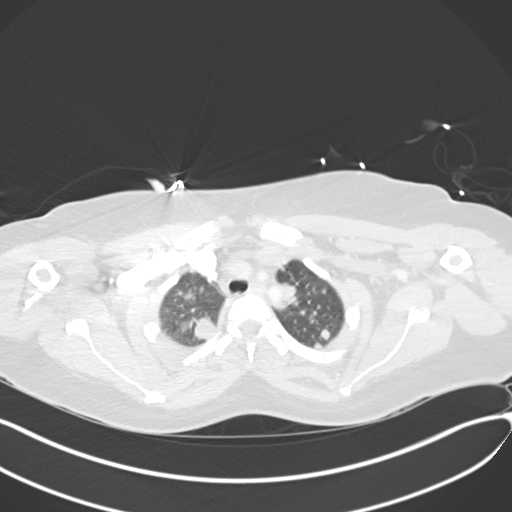
[im 137/149  lung]
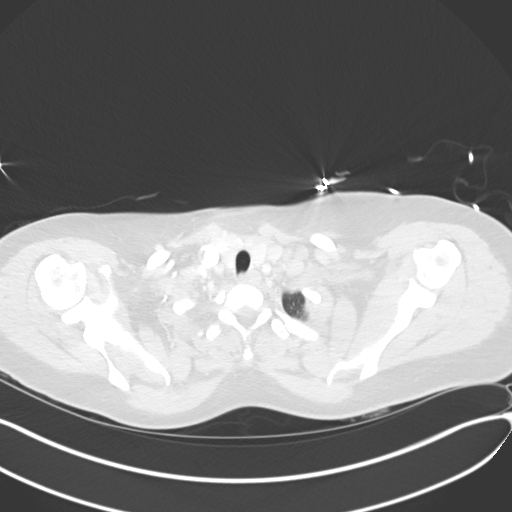

[Series 5: coronal · coronal · 0.60mm/px · 3 of 134 slices shown]
[im 27/134  lung]
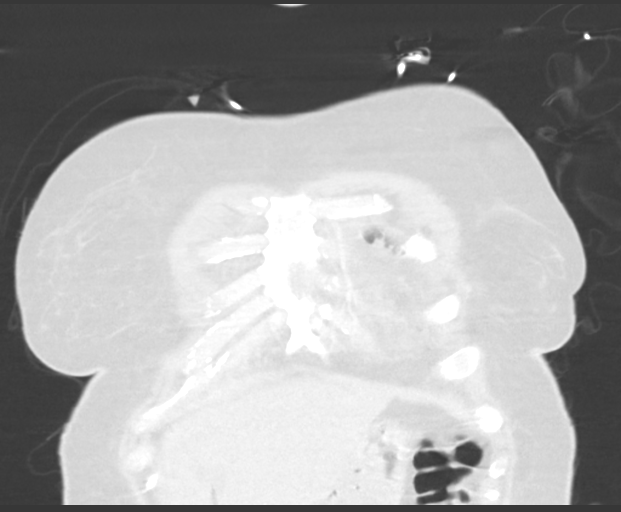
[im 54/134  lung]
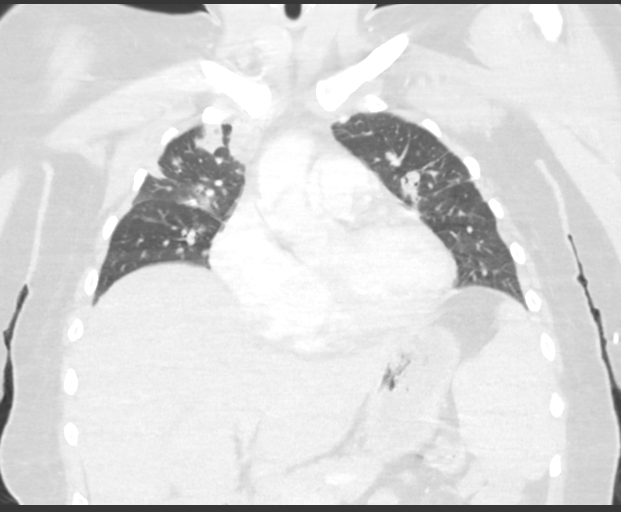
[im 80/134  lung]
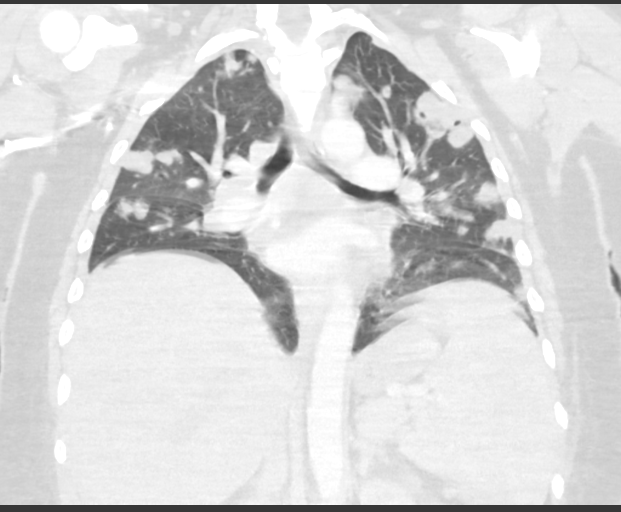

[15 of 36 positions shown; findings below may reference images not displayed]

RADIATION DOSE REDUCTION: This exam was performed according to the
departmental dose-optimization program which includes automated
exposure control, adjustment of the mA and/or kV according to
patient size and/or use of iterative reconstruction technique.

CONTRAST:  75mL OMNIPAQUE IOHEXOL 300 MG/ML  SOLN
FINDINGS: Cardiovascular: Mild cardiac enlargement.  No pericardial effusion.

Mediastinum/Nodes: Thyroid gland, trachea and esophagus are
unremarkable. Enlarged right hilar lymph node measures 1.6 cm short
axis, image 55/2. Unchanged from previous exam. No enlarged
mediastinal or axillary lymph nodes.

Lungs/Pleura: Small bilateral pleural effusions with overlying
atelectasis. Extensive bilateral cavitary lung nodules are
identified involving the upper and lower lung zones compatible with
septic emboli. Sample index nodules include:

-left upper lobe cavitary nodule measures 1.8 cm, image 41/7.

-right upper lobe cavitary lung nodule measures 1.9 cm, image 47/7.

-posterior basal right upper lobe cavitary nodule measures 3.1 cm,
image 58/7.

-right middle lobe subpleural cavitary lung nodule measures 1.8 cm,
image 82/7.

-non cavitary lung nodule within the superior segment of right lower
lobe measures 1 cm, image 57/7.

Upper Abdomen: The spleen appears enlarged measuring 16 cm AP, image
114/2. Left adrenal nodule measures 1.9 cm, image 134/2. This is
unchanged from [DATE]. No follow-up of this nodule is indicated.

Musculoskeletal: No chest wall abnormality. No acute or significant
osseous findings.
IMPRESSION: 1. Extensive bilateral cavitary lung nodules compatible with septic
emboli.
2. Small bilateral pleural effusions with overlying atelectasis.
3. Splenomegaly.

## 2021-08-04 MED ORDER — IOHEXOL 300 MG/ML  SOLN
75.0000 mL | Freq: Once | INTRAMUSCULAR | Status: AC | PRN
  Administered 2021-08-04: 75 mL via INTRAVENOUS

## 2021-08-04 MED ORDER — ADULT MULTIVITAMIN W/MINERALS CH
1.0000 | ORAL_TABLET | Freq: Every day | ORAL | Status: DC
  Administered 2021-08-04 – 2021-09-17 (×45): 1 via ORAL
  Filled 2021-08-04 (×46): qty 1

## 2021-08-04 MED ORDER — THIAMINE HCL 100 MG PO TABS
100.0000 mg | ORAL_TABLET | Freq: Every day | ORAL | Status: DC
  Administered 2021-08-04 – 2021-09-17 (×45): 100 mg via ORAL
  Filled 2021-08-04 (×46): qty 1

## 2021-08-04 MED ORDER — GADOBUTROL 1 MMOL/ML IV SOLN
8.0000 mL | Freq: Once | INTRAVENOUS | Status: AC | PRN
  Administered 2021-08-04: 8 mL via INTRAVENOUS

## 2021-08-04 MED ORDER — NICOTINE 21 MG/24HR TD PT24
21.0000 mg | MEDICATED_PATCH | Freq: Every day | TRANSDERMAL | Status: DC
  Administered 2021-08-04 – 2021-08-21 (×7): 21 mg via TRANSDERMAL
  Filled 2021-08-04 (×25): qty 1

## 2021-08-04 MED ORDER — BOOST / RESOURCE BREEZE PO LIQD CUSTOM
1.0000 | Freq: Two times a day (BID) | ORAL | Status: DC
  Administered 2021-08-04 – 2021-09-17 (×75): 1 via ORAL
  Filled 2021-08-04: qty 1

## 2021-08-04 MED ORDER — SODIUM CHLORIDE (PF) 0.9 % IJ SOLN
INTRAMUSCULAR | Status: AC
Start: 2021-08-04 — End: 2021-08-04
  Filled 2021-08-04: qty 50

## 2021-08-04 MED ORDER — DEXTROSE-NACL 5-0.9 % IV SOLN: INTRAVENOUS | Status: DC

## 2021-08-04 MED ORDER — VANCOMYCIN HCL 1250 MG/250ML IV SOLN
1250.0000 mg | Freq: Two times a day (BID) | INTRAVENOUS | Status: DC
  Administered 2021-08-04 – 2021-08-05 (×2): 1250 mg via INTRAVENOUS
  Filled 2021-08-04 (×2): qty 250

## 2021-08-04 MED ORDER — ENSURE ENLIVE PO LIQD
237.0000 mL | ORAL | Status: DC
  Administered 2021-08-04 – 2021-09-08 (×25): 237 mL via ORAL

## 2021-08-04 NOTE — Assessment & Plan Note (Addendum)
resolved 

## 2021-08-04 NOTE — Progress Notes (Signed)
?Progress Note ? ? ?Patient: Latoya [name] XFG:[id number] DOB: 03/21/1986 DOA: 08/02/2021     2 ?DOS: the patient was seen and examined on 08/04/2021 ?  ?Brief hospital course: ?Mrs. Zubiate was admitted to the hospital with the working diagnosis of severe sepsis due to tricuspid valve endocarditis, complicated with pulmonary septic emboli.  ? ?Latoya [name] is a 36 y.o. female with medical history significant for history of IV drug abuse, heroin abuse, history of MSSA tricuspid valve endocarditis in 2020 treated with ancef and angiojet at [facility], h/o MRSA diskitis / osteomyelitis of L spine in 2021 presented to hospital with complaints of severe low back pain for the last couple of weeks with no other symptoms.  Did complain of subjective fevers at home.  On her initial physical examination her blood pressure was 95/65, HR 97, RR 29 to 33, 02 saturation 97%, lungs clear to auscultation, no rales or wheezing, heart with S1 and S2 present and rhythmic with no gallops, rubs or murmurs, abdomen soft and non tender, no lower extremity edema.  Positive tenderness to palpation lumbar spine. Scabs at the IVDU site.  ? ?Na 127, K 2,4. CL 93, bicarbonate 21, glucose 108, bun 24 cr 0,97 ?AST 53, ALT 19  ?Lactic acid 2.2  ?WBC 31, hgb 10,4 hct 30,8, plt 199  ?Sed rate 86  ?Sars covid 19 negative. ?UA SG 1,024, 0-5 wbc, 30 protein.  ? ?Lumbar spine MRI with no signs of infection.  ? ?Blood cultures 4/4 bottles positive for staph aureus.  ?Chest radiograph with patchy infiltrates left upper and lower lobe, right upper lobe (mid lung).  ? ?EKG 101 bpm, normal axis, qtc 527, sinus rhythm, with no significant ST segment or T wave changes.  ? ?Assessment and Plan: ?* Endocarditis of tricuspid valve ?Severe sepsis (present on admission), end organ failure hypotension.  ?Complicated with pulmonary septic emboli.  ?Staphylococcus bacteremia.   ? ?Echocardiogram with small oscillating density on tricuspid valve. LV EF 60 to 65%, preserved  RV systolic function.  ?T max 102.6 yesterday, wbc is 22.  ? ?Plan to continue antibiotic therapy with vancomycin.  ?TEE scheduled for 4/4.  ?Change IV fluids with isotonic saline with dextrose at 50 ml per hr.  ?Follow up on repeat blood cultures.  ?Continue with high risk of worsening sepsis, keep patient in progressive care unit.  ?Out of bed to chair tid with meals, PT and OT.  ? ? ? ?Hyponatremia ?Renal function with serum cr at 0,65, K is 4,2 and Na 130, with serum bicarbonate at 20. ? ?Plan to change IV fluids to isotonic saline with dextrose at 50 ml per hr.  ?Follow up renal function in am, avoid hypotension and nephrotoxic medications.  ?Consult nutrition.  ? ?History of bacterial endocarditis ?TV in 2020, MSSA, got angiovac at [facility] ?Treated with Ancef at that time. ?Likely she has recurrent endocarditis.  ? ?Anemia of infection and chronic disease ?hgb today 8,2 and hct 26,4 ?Check iron panel in am.  ? ?IV drug abuse (HCC) ?Patient with metabolic encephalopathy, somnolent this am.  ?Plan to continue neuro checks. ?Continue as needed benzodiazepines in case of anxiety or withdrawal symptoms. ?Add thiamine and continue with suboxone.  ? ?Opioid use disorder ?suboxone protocol for withdrawals. ?Toradol for back pain PRN. ? ? ? ? ?  ? ?Subjective: patient is somnolent, only answering to simple questions, keeps her eyes closed.  ? ?Physical Exam: ?Vitals:  ? 08/04/21 0500 08/04/21 0600 08/04/21 0700 08/04/21 0800  ?BP:  116/74 117/90 119/83  ?Pulse: (!) 101 93 90 88  ?Resp: (!) 33 (!) 22 (!) 34 (!) 34  ?Temp:      ?TempSrc:      ?SpO2: 95% 98% 98% 99%  ?Weight:      ?Height:      ? ?Deconditioned and ill looking appearing  ?Neurology somnolent, but easy to arouse, responds to simple questions ?ENT positive pallor ?Cardiovascular with S1 and S2 present and tachycardic, positive systolic murmur at the right sternal border, no gallops or rubs ?Respiratory with no rales or wheezing, no rhonchi ?Abdomen mild  distended but not tender ?No lower extremity edema ?Multiple ecchymosis lower extremities.   ?Data Reviewed: ? ? ? ?Family Communication: no family at the bedside  ? ?Disposition: ?Status is: Inpatient ?Remains inpatient appropriate because: critically ill  ? Planned Discharge Destination: Home ? ?Author: ?Coralie Keens, MD ?08/04/2021 9:34 AM ? ?For on call review [email or web address].  ?

## 2021-08-04 NOTE — Progress Notes (Signed)
Initial Nutrition Assessment ? ?DOCUMENTATION CODES:  ? ?Not applicable ? ?INTERVENTION:  ?- will order Boost Breeze BID, each supplement provides 250 kcal and 9 grams of protein. ?- will order Ensure Plus High Protein once/day each supplement provides 350 kcal and 20 grams of protein. ?- will order 1 tablet multivitamin with minerals/day.  ? ? ?NUTRITION DIAGNOSIS:  ? ?Increased nutrient needs related to acute illness as evidenced by estimated needs. ? ?GOAL:  ? ?Patient will meet greater than or equal to 90% of their needs ? ?MONITOR:  ? ?PO intake, Supplement acceptance, Labs, Weight trends ? ?REASON FOR ASSESSMENT:  ? ?Consult ?Assessment of nutrition requirement/status ? ?ASSESSMENT:  ? ?36 y.o. 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 She presented to the ED with severe low back pain for several weeks and intermittent fevers at home. She was admitted with severe sepsis and endocarditis of tricuspid valve. ? ?Patient laying in bed with no visitors present at the time of RD visit. She was resting with eyes closed. Patient did minimally engage with RD but kept eyes closed throughout.  ? ?She reports having yogurt and fruit for breakfast. Lunch tray was at bedside and she was agreeable to RD getting her set up for the meal.  ? ?She denied any abdominal pain or nausea at the time of RD visit. She reports that she was eating nothing PTA but is unable to give further details such as how long it had been since she ate PTA. ? ?Weight yesterday was 181 lb and weight is up compared to all other weights recorded in the chart since 03/29/19. ? ? ?Labs reviewed; Na: 130 mmol/l, Ca: 7.5 mg/dl. ? ?Medications reviewed; 400 mg Mag-ox BID, 40 mg oral protonix BID, 100 mg oral thiamine/day. ? ?IVF; D5-NS @ 50 ml/hr (204 kcal/24 hrs). ?  ? ?NUTRITION - FOCUSED PHYSICAL  EXAM: ? ?Flowsheet Row Most Recent Value  ?Orbital Region No depletion  ?Upper Arm Region No depletion  ?Thoracic and Lumbar Region Unable to assess  ?Buccal Region No depletion  ?Temple Region No depletion  ?Clavicle Bone Region No depletion  ?Clavicle and Acromion Bone Region No depletion  ?Scapular Bone Region No depletion  ?Dorsal Hand No depletion  ?Patellar Region No depletion  ?Anterior Thigh Region No depletion  ?Posterior Calf Region No depletion  ?Edema (RD Assessment) Mild  [BLE]  ?Hair Reviewed  ?Eyes Reviewed  ?Mouth Reviewed  ?Skin Reviewed  [scattered wounds and bruising throughout BLE]  ?Nails Reviewed  ? ?  ? ? ?Diet Order:   ?Diet Order   ? ?       ?  Diet regular Room service appropriate? Yes; Fluid consistency: Thin  Diet effective now       ?  ? ?  ?  ? ?  ? ? ?EDUCATION NEEDS:  ? ?Not appropriate for education at this time ? ?Skin:  Skin Assessment: Reviewed RN Assessment ? ?Last BM:  3/30 (type 6 x1, large amount) ? ?Height:  ? ?Ht Readings from Last 1 Encounters:  ?08/02/21 5\' 6"  (1.676 m)  ? ? ?Weight:  ? ?Wt Readings from Last 1 Encounters:  ?08/03/21 82.2 kg  ? ? ? ?BMI:  Body mass index is 29.25 kg/m?. ? ?Estimated Nutritional Needs:  ?Kcal:  2100-2400 kcal ?Protein:  105-120 grams ?Fluid:  >/= 2.3 L/day ? ? ? ? ?  Jarome Matin, MS, RD, LDN ?Registered Dietitian II ?Inpatient Clinical Nutrition ?RD pager # and on-call/weekend pager # available in [place]  ? ?

## 2021-08-04 NOTE — Evaluation (Signed)
Physical Therapy Evaluation ?Patient Details ?Name: Latoya [name] ?MRN: [id number] ?DOB: November 01, 1985 ?Today's Date: 08/04/2021 ? ?History of Present Illness ? Latoya [name] is a 36 y.o. female with medical history significant of IVDU, heroin abuse, ongoing. Admitted with back pain, hypotension, hyponatremia, leukocytosis and neutrophilia. Found to have bacteremia possible sepitc pulmonary emboli from endocarditis.  ?Clinical Impression ? PAtient presents with significant deficits affecting safety and independence with mobility.  Currently mod to max A for up to EOB and not well tolerated with pain, though VSS throughout.  Feel she will benefit from skilled PT In the acute setting and though no support following hospitalization per pt feel she would benefit from and possibly achieve mod I following acute inpatient rehab stay.  PT will continue to follow acutely.    ?   ? ?Recommendations for follow up therapy are one component of a multi-disciplinary discharge planning process, led by the attending physician.  Recommendations may be updated based on patient status, additional functional criteria and insurance authorization. ? ?Follow Up Recommendations Acute inpatient rehab (3hours/day) ? ?  ?Assistance Recommended at Discharge Frequent or constant Supervision/Assistance  ?Patient can return home with the following ? Two people to help with walking and/or transfers;A lot of help with bathing/dressing/bathroom ? ?  ?Equipment Recommendations Other (comment) (TBA)  ?Recommendations for Other Services ? Rehab consult  ?  ?Functional Status Assessment Patient has had a recent decline in their functional status and demonstrates the ability to make significant improvements in function in a reasonable and predictable amount of time.  ? ?  ?Precautions / Restrictions Precautions ?Precautions: Fall  ? ?  ? ?Mobility ? Bed Mobility ?Overal bed mobility: Needs Assistance ?Bed Mobility: Supine to Sit, Sit to Supine ?  ?  ?Supine  to sit: Max assist, HOB elevated ?Sit to supine: Mod assist ?  ?General bed mobility comments: assist for legs off bed, sliding hips and lifting trunk, to supine assist for legs and cues for managing upper body, assist +2 to scoot up in bed ?  ? ?Transfers ?  ?  ?  ?  ?  ?  ?  ?  ?  ?General transfer comment: NT, pt too anxious, weak with +1 ?  ? ?Ambulation/Gait ?  ?  ?  ?  ?  ?  ?  ?  ? ?Stairs ?  ?  ?  ?  ?  ? ?Wheelchair Mobility ?  ? ?Modified Rankin (Stroke Patients Only) ?  ? ?  ? ?Balance Overall balance assessment: Needs assistance ?Sitting-balance support: Feet unsupported ?Sitting balance-Leahy Scale: Fair ?Sitting balance - Comments: EOB about 3-4 minutes able to kick legs and twist to look behind her ?  ?  ?  ?  ?  ?  ?  ?  ?  ?  ?  ?  ?  ?  ?  ?   ? ? ? ?Pertinent Vitals/Pain Pain Assessment ?Pain Assessment: Faces ?Faces Pain Scale: Hurts whole lot ?Pain Location: legs, back ?Pain Descriptors / Indicators: Grimacing, Guarding ?Pain Intervention(s): Monitored during session, Repositioned, Limited activity within patient's tolerance  ? ? ?Home Living Family/patient expects to be discharged to:: Skilled nursing facility ?  ?  ?  ?  ?  ?  ?  ?  ?  ?Additional Comments: reports stays where she lands and denies having any support  ?  ?Prior Function   ?  ?  ?  ?  ?  ?  ?Mobility Comments: describes difficulty with mobilization with  poor choices and passing out a lot ?  ?  ? ? ?Hand Dominance  ?   ? ?  ?Extremity/Trunk Assessment  ? Upper Extremity Assessment ?Upper Extremity Assessment: Generalized weakness ?  ? ?Lower Extremity Assessment ?Lower Extremity Assessment: RLE deficits/detail;LLE deficits/detail ?RLE Deficits / Details: AAROM WFL, strength grossly 2/5 hip flexion and knee extension 2+/5 ?RLE Sensation: decreased light touch ?RLE Coordination: decreased gross motor ?LLE Deficits / Details: AAROM WFL, strength grossly 2/5 hip flexion and knee extension 2+/5 ?LLE Sensation: decreased light  touch ?LLE Coordination: decreased gross motor ?  ? ?   ?Communication  ? Communication: No difficulties  ?Cognition Arousal/Alertness: Awake/alert ?Behavior During Therapy: Anxious ?Overall Cognitive Status: Impaired/Different from baseline ?Area of Impairment: Orientation, Memory, Safety/judgement, Problem solving ?  ?  ?  ?  ?  ?  ?  ?  ?Orientation Level: Situation, Disoriented to ?  ?Memory: Decreased short-term memory ?  ?Safety/Judgement: Decreased awareness of deficits ?  ?Problem Solving: Slow processing ?  ?  ?  ? ?  ?General Comments   ? ?  ?Exercises    ? ?Assessment/Plan  ?  ?PT Assessment Patient needs continued PT services  ?PT Problem List Decreased strength;Decreased mobility;Decreased safety awareness;Decreased activity tolerance;Decreased knowledge of use of DME;Decreased balance;Pain ? ?   ?  ?PT Treatment Interventions DME instruction;Therapeutic activities;Therapeutic exercise;Patient/family education;Balance training;Functional mobility training;Neuromuscular re-education   ? ?PT Goals (Current goals can be found in the Care Plan section)  ?Acute Rehab PT Goals ?Patient Stated Goal: to get better ?PT Goal Formulation: With patient ?Time For Goal Achievement: 08/18/21 ?Potential to Achieve Goals: Fair ? ?  ?Frequency Min 3X/week ?  ? ? ?Co-evaluation   ?  ?  ?  ?  ? ? ?  ?AM-PAC PT "6 Clicks" Mobility  ?Outcome Measure Help needed turning from your back to your side while in a flat bed without using bedrails?: Total ?Help needed moving from lying on your back to sitting on the side of a flat bed without using bedrails?: Total ?Help needed moving to and from a bed to a chair (including a wheelchair)?: Total ?Help needed standing up from a chair using your arms (e.g., wheelchair or bedside chair)?: Total ?Help needed to walk in hospital room?: Total ?Help needed climbing 3-5 steps with a railing? : Total ?6 Click Score: 6 ? ?  ?End of Session   ?Activity Tolerance: Patient limited by  fatigue;Patient limited by pain ?Patient left: in bed;with call bell/phone within reach;with nursing/sitter in room ?  ?PT Visit Diagnosis: Other abnormalities of gait and mobility (R26.89);Muscle weakness (generalized) (M62.81);Difficulty in walking, not elsewhere classified (R26.2) ?  ? ?Time: 4356-8616 ?PT Time Calculation (min) (ACUTE ONLY): 29 min ? ? ?Charges:   PT Evaluation ?$PT Eval Moderate Complexity: 1 Mod ?PT Treatments ?$Therapeutic Activity: 8-22 mins ?  ?   ? ? ?Sheran Lawless, PT ?Acute Rehabilitation Services ?Pager:[phone] ?[phone] ?08/04/2021 ? ? ?Elray Mcgregor ?08/04/2021, 5:17 PM ? ?

## 2021-08-04 NOTE — Progress Notes (Addendum)
? ?[facility] Infectious Diseases Follow Up Note ? ?Patient Identification: ?Patient Name: Latoya DecampHeather [name] MRN: [id number] Admit Date: 08/02/2021  1:58 PM ?Age: 36 y.o.Today's Date: 08/04/2021 ? ? ?Reason for Visit: bacteremia  ? ?Principal Problem: ?  Endocarditis of tricuspid valve ?Active Problems: ?  Anemia of infection and chronic disease ?  Opioid use disorder ?  IV drug abuse (HCC) ?  History of bacterial endocarditis ?  Hyponatremia ? ?Antibiotics:  ?Vancomycin 3/29-c ?Cefepime 3/29-3/30 ?Metronidazole 3/29-3/30 ?  ?Lines/Hardware: ?  ?Interval Events: afebrile. Refused CT chest  ? ?Assessment ?# High grade MRSA bacteremia with possible septic pulmonary emboli/Endocarditis ?  ?# Lower back pain-MRI L-spine negative for discitis osteomyelitis. Has significant tenderness in thoracic spine  ?  ?# Active IVDU- Hep B surface ag negative ?# Smoker ?  ?Others ?# MSSA TV endocarditis in 202- [facility]azolin [facility] ?# MRSA L4-L5 facet joint septic arthritis in 07/2019( IV Vancomycin >>Linezolid  on discharge? Compliance)  ? ? ?Recommendations ?Continue vancomycin,  pharmacy to dose ?FU repeat blood cultures for clearance ?Fu CT chest ( she is willing today) ?I have ordered MRI T spine given significant tenderness in thoracic spine ?TEE on 4/4 ?FU hep B and C serology  ? ?Dr Earlene PlaterWallace on call this weekend and will follow cultures. I am back Monday  ? ?Rest of the management as per the primary team. ?Thank you for the consult. Please page with pertinent questions or concerns. ? ?______________________________________________________________________ ?Subjective ?patient seen and examined at the bedside.  ?Appears drowsy but arousable to voice ? ?Vitals ?BP 113/79   Pulse (!) 106   Temp 99.1 ?F (37.3 ?C) (Oral)   Resp (!) 30   Ht 5\' 6"  (1.676 m)   Wt 82.2 kg   SpO2 97%   BMI 29.25 kg/m?  ? ?  ?Physical Exam ?Constitutional:  not in acute distress  and appears drowsy ?   Comments:  ? ?Cardiovascular:  ?   Rate and Rhythm: Normal rate and regular rhythm.  ?   Heart sounds:  ? ?Pulmonary:  ?   Effort: Pulmonary effort is normal.  ?   Comments:  ? ?Abdominal:  ?   Palpations: Abdomen is soft.  ?   Tenderness: non distended  ? ?Musculoskeletal:     ?   General:  ? ? ?Skin: ?   Comments:  ? ?Neurological:  ?   General: Grossly non focal, awake, alert and oriented  ? ?Psychiatric:     ?   Mood and Affect: Mood normal.  ? ?Pertinent Microbiology ?Results for orders placed or performed during the hospital encounter of 08/02/21  ?Blood culture (routine x 2)     Status: Abnormal (Preliminary result)  ? Collection Time: 08/02/21  2:40 PM  ? Specimen: BLOOD  ?Result Value Ref Range Status  ? Specimen Description   Final  ?  BLOOD LEFT ANTECUBITAL ?Performed at [facility], [address] [place], [place] [address] ?  ? Special Requests   Final  ?  BOTTLES DRAWN AEROBIC AND ANAEROBIC Blood Culture results may not be optimal due to an excessive volume of blood received in culture bottles ?Performed at [facility], [address] [place], [place] [address] ?  ? Culture  Setup Time   Final  ?  GRAM POSITIVE COCCI IN CLUSTERS ?IN BOTH AEROBIC AND ANAEROBIC BOTTLES ?CRITICAL RESULT CALLED TO, READ BACK BY AND VERIFIED WITH: PHARMD TERRY GREEN 08/03/21 @ 9:31 BY DRT ?  ?  Culture (A)  Final  ?  STAPHYLOCOCCUS AUREUS ?SUSCEPTIBILITIES TO FOLLOW ?Performed at [facility], [address], [place], [place] [address] ?  ? Report Status PENDING  Incomplete  ?Blood Culture ID Panel (Reflexed)     Status: Abnormal  ? Collection Time: 08/02/21  2:40 PM  ?Result Value Ref Range Status  ? Enterococcus faecalis NOT DETECTED NOT DETECTED Final  ? Enterococcus Faecium NOT DETECTED NOT DETECTED Final  ? Listeria monocytogenes NOT DETECTED NOT DETECTED Final  ? Staphylococcus species DETECTED (A) NOT DETECTED Final  ?  Comment: CRITICAL RESULT  CALLED TO, READ BACK BY AND VERIFIED WITH: ?PHARMD TERRY GREEN 08/03/21 @ 9:32 BY DRT ?  ? Staphylococcus aureus (BCID) DETECTED (A) NOT DETECTED Final  ?  Comment: Methicillin (oxacillin)-resistant Staphylococcus aureus (MRSA). MRSA is predictably resistant to beta-lactam antibiotics (except ceftaroline). Preferred therapy is vancomycin unless clinically contraindicated. Patient requires contact precautions if  ?hospitalized. ?CRITICAL RESULT CALLED TO, READ BACK BY AND VERIFIED WITH: ?PHARMD TERRY GREEN 08/03/21 @ 9:32 BY DRT ?  ? Staphylococcus epidermidis NOT DETECTED NOT DETECTED Final  ? Staphylococcus lugdunensis NOT DETECTED NOT DETECTED Final  ? Streptococcus species NOT DETECTED NOT DETECTED Final  ? Streptococcus agalactiae NOT DETECTED NOT DETECTED Final  ? Streptococcus pneumoniae NOT DETECTED NOT DETECTED Final  ? Streptococcus pyogenes NOT DETECTED NOT DETECTED Final  ? A.calcoaceticus-baumannii NOT DETECTED NOT DETECTED Final  ? Bacteroides fragilis NOT DETECTED NOT DETECTED Final  ? Enterobacterales NOT DETECTED NOT DETECTED Final  ? Enterobacter cloacae complex NOT DETECTED NOT DETECTED Final  ? Escherichia coli NOT DETECTED NOT DETECTED Final  ? Klebsiella aerogenes NOT DETECTED NOT DETECTED Final  ? Klebsiella oxytoca NOT DETECTED NOT DETECTED Final  ? Klebsiella pneumoniae NOT DETECTED NOT DETECTED Final  ? Proteus species NOT DETECTED NOT DETECTED Final  ? Salmonella species NOT DETECTED NOT DETECTED Final  ? Serratia marcescens NOT DETECTED NOT DETECTED Final  ? Haemophilus influenzae NOT DETECTED NOT DETECTED Final  ? Neisseria meningitidis NOT DETECTED NOT DETECTED Final  ? Pseudomonas aeruginosa NOT DETECTED NOT DETECTED Final  ? Stenotrophomonas maltophilia NOT DETECTED NOT DETECTED Final  ? Candida albicans NOT DETECTED NOT DETECTED Final  ? Candida auris NOT DETECTED NOT DETECTED Final  ? Candida glabrata NOT DETECTED NOT DETECTED Final  ? Candida krusei NOT DETECTED NOT DETECTED Final  ?  Candida parapsilosis NOT DETECTED NOT DETECTED Final  ? Candida tropicalis NOT DETECTED NOT DETECTED Final  ? Cryptococcus neoformans/gattii NOT DETECTED NOT DETECTED Final  ? Meth resistant mecA/C and MREJ DETECTED (A) NOT DETECTED Final  ?  Comment: CRITICAL RESULT CALLED TO, READ BACK BY AND VERIFIED WITH: ?PHARMD TERRY GREEN 08/03/21 @ 9:32 BY DRT ?Performed at [facility], [address], [place], [place] [address] ?  ?Blood culture (routine x 2)     Status: Abnormal (Preliminary result)  ? Collection Time: 08/02/21  5:09 PM  ? Specimen: BLOOD  ?Result Value Ref Range Status  ? Specimen Description   Final  ?  BLOOD BLOOD RIGHT ARM ?Performed at [facility], [address] [place], [place] [address] ?  ? Special Requests   Final  ?  BOTTLES DRAWN AEROBIC AND ANAEROBIC Blood Culture adequate volume ?Performed at [facility], [address] [place], [place] [address] ?  ? Culture  Setup Time   Final  ?  GRAM POSITIVE COCCI IN CLUSTERS ?IN BOTH AEROBIC AND ANAEROBIC BOTTLES ?Performed at [facility], [address] [place], [place] [address] ?  ? Culture STAPHYLOCOCCUS AUREUS (A)  Final  ? Report Status PENDING  Incomplete  ?Resp Panel by RT-PCR (Flu A&B, Covid) Nasopharyngeal Swab     Status: None  ? Collection Time: 08/02/21  9:33 PM  ? Specimen: Nasopharyngeal Swab; Nasopharyngeal(NP) swabs in vial transport medium  ?Result Value Ref Range Status  ? SARS Coronavirus 2 by RT PCR NEGATIVE NEGATIVE Final  ?  Comment: (NOTE) ?SARS-CoV-2 target nucleic acids are NOT DETECTED. ? ?The SARS-CoV-2 RNA is generally detectable in upper respiratory ?specimens during the acute phase of infection. The lowest ?concentration of SARS-CoV-2 viral copies this assay can detect is ?138 copies/mL. A negative result does not preclude SARS-Cov-2 ?infection and should not be used as the sole basis for treatment or ?other patient management decisions. A negative result may occur  with  ?improper specimen collection/handling, submission of specimen other ?than nasopharyngeal swab, presence of viral mutation(s) within the ?areas targeted by this assay, and inadequate number of viral ?copies(<1

## 2021-08-04 NOTE — TOC Progression Note (Signed)
Transition of Care (TOC) - Progression Note  ? ? ?Patient Details  ?Name: Latoya [name] ?MRN: [id number] ?Date of Birth: 01/25/1986 ? ?Transition of Care (TOC) CM/SW Contact  ?Ida Rogue, LCSW ?Phone Number: ?08/04/2021, 2:29 PM ? ?Clinical Narrative:  Spoke with Marcelino Duster at [facility] in [place], who confirms that based on homeless status and [place] [facility] patient is eligible for their rehab program.  Patient cannot come to them on benzos, opiates [Suboxone} nor any infusion medications. If potentially meeting admission criteria closer to d/c, FAX updated clinicals to [phone]. TOC will continue to follow during the course of hospitalization. ? ? ? ? ?Expected Discharge Plan:  (Unknown) ?Barriers to Discharge: Continued Medical Work up ? ?Expected Discharge Plan and Services ?Expected Discharge Plan:  (Unknown) ?In-house Referral: Clinical Social Work ?  ?  ?Living arrangements for the past 2 months: No permanent address ?                ?  ?  ?  ?  ?  ?  ?  ?  ?  ?  ? ? ?Social Determinants of Health (SDOH) Interventions ?  ? ?Readmission Risk Interventions ?   ? View : No data to display.  ?  ?  ?  ? ? ?

## 2021-08-04 NOTE — Assessment & Plan Note (Addendum)
Patient presenting to the ED with back pain and fevers in the setting of continued IV drug abuse.  Met criteria for severe sepsis with endorgan failure and hypotension on admission complicated by pulmonary septic emboli and MRSA bacteremia with tricuspid valve endocarditis on echocardiogram. ?-Patient was seen by infectious disease and placed on antibiotics. ?--CT surgery c/s, underwent angio vac, but procedure was aborted due to positive bubble study concerning for PFO with further investigation revealing connection between R and L atrium with flows from R to L ?-It appears that patient is supposed to complete her antibiotic course on May 13.  She was not a candidate for home antibiotic treatment. ?--initial blood cultures from 3/29 with MRSA.  repeat blood cultures 4/2; 1/4 positive for Staph epidermidis, likely contaminant; otherwise remaining bottles no growth x5 days ?-- Patient remains on vancomycin. ?She however started spiking fevers again a few days ago.  ID was reconsulted. ?Cultures from 5/9 are negative so far. ?Fever thought to be secondary to PE.  No change in end date of vancomycin at this time, 5/13. ?Echocardiogram was repeated and shows enlargement in the vegetation.  Discussed with ID.  Cardiothoracic to be reconsulted.  Although it does not appear that they will be able to offer her much. ?Blood pressures are noted to be low overnight.  Procalcitonin 0.14.  Lactic acid level is normal.  Could consider IV hydration. ?

## 2021-08-05 DIAGNOSIS — E871 Hypo-osmolality and hyponatremia: Secondary | ICD-10-CM | POA: Diagnosis not present

## 2021-08-05 DIAGNOSIS — Z419 Encounter for procedure for purposes other than remedying health state, unspecified: Secondary | ICD-10-CM | POA: Diagnosis not present

## 2021-08-05 DIAGNOSIS — D638 Anemia in other chronic diseases classified elsewhere: Secondary | ICD-10-CM | POA: Diagnosis not present

## 2021-08-05 DIAGNOSIS — Z8679 Personal history of other diseases of the circulatory system: Secondary | ICD-10-CM | POA: Diagnosis not present

## 2021-08-05 DIAGNOSIS — D509 Iron deficiency anemia, unspecified: Secondary | ICD-10-CM | POA: Diagnosis not present

## 2021-08-05 DIAGNOSIS — B999 Unspecified infectious disease: Secondary | ICD-10-CM | POA: Diagnosis not present

## 2021-08-05 DIAGNOSIS — I079 Rheumatic tricuspid valve disease, unspecified: Secondary | ICD-10-CM | POA: Diagnosis not present

## 2021-08-05 LAB — CBC WITH DIFFERENTIAL/PLATELET
Abs Immature Granulocytes: 0.41 10*3/uL — ABNORMAL HIGH (ref 0.00–0.07)
Basophils Absolute: 0.1 10*3/uL (ref 0.0–0.1)
Basophils Relative: 0 %
Eosinophils Absolute: 0.2 10*3/uL (ref 0.0–0.5)
Eosinophils Relative: 1 %
HCT: 25.9 % — ABNORMAL LOW (ref 36.0–46.0)
Hemoglobin: 8.5 g/dL — ABNORMAL LOW (ref 12.0–15.0)
Immature Granulocytes: 2 %
Lymphocytes Relative: 11 %
Lymphs Abs: 1.9 10*3/uL (ref 0.7–4.0)
MCH: 23 pg — ABNORMAL LOW (ref 26.0–34.0)
MCHC: 32.8 g/dL (ref 30.0–36.0)
MCV: 70.2 fL — ABNORMAL LOW (ref 80.0–100.0)
Monocytes Absolute: 1.3 10*3/uL — ABNORMAL HIGH (ref 0.1–1.0)
Monocytes Relative: 7 %
Neutro Abs: 13.6 10*3/uL — ABNORMAL HIGH (ref 1.7–7.7)
Neutrophils Relative %: 79 %
Platelets: 231 10*3/uL (ref 150–400)
RBC: 3.69 MIL/uL — ABNORMAL LOW (ref 3.87–5.11)
RDW: 19.9 % — ABNORMAL HIGH (ref 11.5–15.5)
WBC: 17.4 10*3/uL — ABNORMAL HIGH (ref 4.0–10.5)
nRBC: 0.1 % (ref 0.0–0.2)

## 2021-08-05 LAB — VANCOMYCIN, TROUGH: Vancomycin Tr: 9 ug/mL — ABNORMAL LOW (ref 15–20)

## 2021-08-05 LAB — CULTURE, BLOOD (ROUTINE X 2): Special Requests: ADEQUATE

## 2021-08-05 LAB — BASIC METABOLIC PANEL
Anion gap: 6 (ref 5–15)
BUN: 13 mg/dL (ref 6–20)
CO2: 22 mmol/L (ref 22–32)
Calcium: 7.6 mg/dL — ABNORMAL LOW (ref 8.9–10.3)
Chloride: 105 mmol/L (ref 98–111)
Creatinine, Ser: 0.63 mg/dL (ref 0.44–1.00)
GFR, Estimated: >60 mL/min (ref >60)
Glucose, Bld: 113 mg/dL — ABNORMAL HIGH (ref 70–99)
Potassium: 4.2 mmol/L (ref 3.5–5.1)
Sodium: 133 mmol/L — ABNORMAL LOW (ref 135–145)

## 2021-08-05 LAB — HCV RNA QUANT: HCV Quantitative: NOT DETECTED IU/mL (ref >50)

## 2021-08-05 LAB — MAGNESIUM: Magnesium: 2.1 mg/dL (ref 1.7–2.4)

## 2021-08-05 LAB — HEPATITIS B SURFACE ANTIBODY, QUANTITATIVE: Hep B S AB Quant (Post): <3.1 m[IU]/mL — ABNORMAL LOW (ref >9.9)

## 2021-08-05 MED ORDER — VANCOMYCIN HCL 1500 MG/300ML IV SOLN
1500.0000 mg | Freq: Two times a day (BID) | INTRAVENOUS | Status: DC
  Administered 2021-08-05 – 2021-08-08 (×6): 1500 mg via INTRAVENOUS
  Filled 2021-08-05 (×7): qty 300

## 2021-08-05 MED ORDER — PANTOPRAZOLE SODIUM 40 MG PO TBEC
40.0000 mg | DELAYED_RELEASE_TABLET | Freq: Every day | ORAL | Status: DC
  Administered 2021-08-06 – 2021-09-17 (×43): 40 mg via ORAL
  Filled 2021-08-05 (×44): qty 1

## 2021-08-05 MED ORDER — HYDROMORPHONE HCL 1 MG/ML IJ SOLN
1.0000 mg | INTRAMUSCULAR | Status: DC | PRN
  Administered 2021-08-05 – 2021-08-08 (×15): 1 mg via INTRAVENOUS
  Filled 2021-08-05 (×16): qty 1

## 2021-08-05 NOTE — Progress Notes (Signed)
Pharmacy Antibiotic Note ? ?Latoya [name] is a 36 y.o. female admitted on 08/02/2021 with MRSA bacteremia- possible septic pulmonary emboli/endocarditis. Pharmacy has been consulted for Vancomycin dosing. ? ?Old records indicate a therapeutic AUC on a dose of 1250 mg/12h with SCr ~ 0.6 - renal function has improved to this baseline ? ?Vanc Peak/Trough collected= calculated Cmax 428.1- at low end of goal range and Cmin <10.  ?WBC improving, still spiking fevers. ? ?Plan: ?- Slightly increase Vancomycin to 1500 mg IV every 12 hours to target AUC ~500 & Cmin >10 ?- Will continue to follow renal function, culture results, LOT, and antibiotic plans  ? ?Height: 5\' 6"  (167.6 cm) ?Weight: 82.2 kg (181 lb 3.5 oz) ?IBW/kg (Calculated) : 59.3 ? ?Temp (24hrs), Avg:99.9 ?F (37.7 ?C), Min:98.7 ?F (37.1 ?C), Max:101.7 ?F (38.7 ?C) ? ?Recent Labs  ?Lab 08/02/21 ?1442 08/02/21 ?1710 08/03/21 ?08/05/21 08/04/21 ?08/06/21 08/04/21 ?2137 08/05/21 ?0425  ?WBC 31.7*  --  24.0* 22.0*  --  17.4*  ?CREATININE 0.97  --  0.82 0.65  --  0.63  ?LATICACIDVEN 2.2* 1.8  --   --   --   --   ?VANCOTROUGH  --   --   --   --   --  9*  ?VANCOPEAK  --   --   --   --  21*  --   ? ?  ?Estimated Creatinine Clearance: 107.2 mL/min (by C-G formula based on SCr of 0.63 mg/dL).   ? ?Allergies  ?Allergen Reactions  ? Naltrexone Anxiety  ?  Severe anxiety, restlessness, vomiting  ? ? ?Antimicrobials this admission: ?Cefepime 3/29 >> 3/30 ?Flagyl 3/29 >> 3/30 ?Vancomycin 3/29 >> ? ?Dose adjustments this admission: ? ? ?Microbiology results: ?3/29 Influenza/COVID>> neg ?3/29 MRSA PCR >> positive ?3/29 BCx >> GPC in 4/4 bottles (BCID MRSA) ? ?Thank you for allowing pharmacy to be a part of this patient?s care. ? ?4/29, PharmD, BCPS ?08/05/2021 5:57 AM  ? ? ?

## 2021-08-05 NOTE — Progress Notes (Signed)
Inpatient Rehab Admissions Coordinator Note:  ? ?Per PT patient was screened for CIR candidacy by Suresh Audi Danford Bad, CCC-SLP. At this time, pt has not yet attempted transfers. Pt may have potential to progress to becoming a potential CIR candidate. CIR admissions team will follow and monitor for progress and participation with therapies.   ? ? ?Gayland Curry, MS, CCC-SLP ?Admissions Coordinator ?[id number] ?08/05/21 ?3:06 PM ? ?

## 2021-08-05 NOTE — Progress Notes (Signed)
OT Cancellation Note ? ?Patient Details ?Name: Latoya [name] ?MRN: [id number] ?DOB: 1985/06/27 ? ? ?Cancelled Treatment:    Reason Eval/Treat Not Completed: Medical issues which prohibited therapy ?Patient is in RED MEWS at this time with elevated temp. OT to continue to follow and check back as schedule will allow.  ?Eureka Valdes OTR/L, MS ?Acute Rehabilitation Department ?Office# [phone] ?Pager# [phone] ? ?08/05/2021, 2:44 PM ?

## 2021-08-05 NOTE — Progress Notes (Signed)
?Progress Note ? ? ?Patient: Latoya [name] GYI:[id number] DOB: 1985/06/23 DOA: 08/02/2021     3 ?DOS: the patient was seen and examined on 08/05/2021 ?  ?Brief hospital course: ?Latoya [name] was admitted to the hospital with the working diagnosis of severe sepsis due to tricuspid valve endocarditis, complicated with pulmonary septic emboli.  ? ?Latoya [name] is a 35 y.o. 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  Did complain of subjective fevers at home.  On her initial physical examination her blood pressure was 95/65, HR 97, RR 29 to 33, 02 saturation 97%, lungs clear to auscultation, no rales or wheezing, heart with S1 and S2 present and rhythmic with no gallops, rubs or murmurs, abdomen soft and non tender, no lower extremity edema.  Positive tenderness to palpation lumbar spine. Scabs at the IVDU site.  ? ?Na 127, K 2,4. CL 93, bicarbonate 21, glucose 108, bun 24 cr 0,97 ?AST 53, ALT 19  ?Lactic acid 2.2  ?WBC 31, hgb 10,4 hct 30,8, plt 199  ?Sed rate 86  ?Sars covid 19 negative. ?UA SG 1,024, 0-5 wbc, 30 protein.  ? ?Lumbar spine MRI with no signs of infection.  ? ?Blood cultures 4/4 bottles positive for staph aureus.  ?Chest radiograph with patchy infiltrates left upper and lower lobe, right upper lobe (mid lung).  ? ?EKG 101 bpm, normal axis, qtc 527, sinus rhythm, with no significant ST segment or T wave changes.  ? ?Patient was placed on antibiotic therapy. ?Transthoracic echocardiogram with possible vegetation in the tricuspid valve. ?CT chest with septic pulmonary embolisms. ? ?Patient continue to be febrile and tachycardic.  ? ?Assessment and Plan: ?* Endocarditis of tricuspid valve ?Severe sepsis (present  on admission), end organ failure hypotension.  ?Complicated with pulmonary septic emboli.  ?Staphylococcus bacteremia.   ? ?Echocardiogram with small oscillating density on tricuspid valve. LV EF 60 to 65%, preserved RV systolic function.  ?Blood cultures positive for MRSA.  ?Wbc is 17,4 and T max is 101.7 F  ? ?TEE scheduled for 4/4.  ? ?Continue antibiotic therapy with IV vancomycin.  ?IV fluids with D5 NS at 50 ml per hr. ?Continue close monitoring in step down unit. ? ?  ? ? ? ?Hyponatremia ?Patient with no signs of volume overload. ?Renal function with serum cr at 0,63, K is 4,2 and serum Na 133, with bicarbonate at 22. ? ?Plan to continue isotonic saline at 50 ml per hr with dextrose.  ?Follow up renal function and electrolytes in am.  ?Her corrected Ca for albumin is 9.0  ? ?History of bacterial endocarditis ?TV in 2020, MSSA, got angiovac at [facility]. ?Treated with Ancef at that time. ?Likely she has recurrent endocarditis.  ? ?Anemia of infection and chronic disease ?Stable hgb and hct  ?Iron panel with serum iron of 22, transferrin saturation 7, ferritin 51 and TIBC 312. ? ?Plan for IV iron when sepsis resolves. Continue close monitoring cell count.  ? ?IV drug abuse (HCC) ?Patient with improve mentation today, she complains of sever back and chest pain.  ? ?Plan to continue with lorazepam as needed for agitation and will change buprenorphine for hydromorphone for better pain control. ?Continue with thiamine and close neuro checks  per unit protocol. ?Patient with high risk for withdrawal symptoms.   ? ?Opioid use disorder ?Will change buprenorphine to hydromorphone for better pain control.  ? ? ? ? ? ?  ? ?Subjective: patient more awake and alert, positive pain at her back and chest pain, no nausea or vomiting, occasional dyspnea  ? ?Physical Exam: ?Vitals:  ? 08/05/21 0600 08/05/21 0800 08/05/21 1000 08/05/21 1200  ?BP: 116/89     ?Pulse: 99     ?Resp: (!) 33     ?Temp:  (!) 101.6 ?F (38.7 ?C) 99.8 ?F (37.7  ?C) (!) 100.9 ?F (38.3 ?C)  ?TempSrc:  Axillary Oral Oral  ?SpO2: 100%     ?Weight:      ?Height:      ? ?Neurology awake and alert,  ?Deconditioned and ill looking appearing, she looks in pain ?ENT with positive pallor ?Cardiovascular with S1 and S2 present and tachycardic, no gallops or murmurs. ?Respiratory with no wheezing or rales ? Abdomen soft and not distended ?Lower extremities with non pitting edema trace ?Skin with multiple ecchymosis.  ?Data Reviewed: ? ? ? ?Family Communication: no family at the bedside  ? ?Disposition: ?Status is: Inpatient ?Remains inpatient appropriate because: endocarditis.  ? Planned Discharge Destination: Home ? ? ?Author: ?Coralie Keens, MD ?08/05/2021 12:44 PM ? ?For on call review [email or web address].  ?

## 2021-08-05 NOTE — Progress Notes (Signed)
Patient has called out multiple times requesting pain medication.  I explain that it is not time and when she can have it next.  Patient appears to fall asleep and then calls again in 15-20 minutes.  ?

## 2021-08-06 DIAGNOSIS — D638 Anemia in other chronic diseases classified elsewhere: Secondary | ICD-10-CM | POA: Diagnosis not present

## 2021-08-06 DIAGNOSIS — I079 Rheumatic tricuspid valve disease, unspecified: Secondary | ICD-10-CM | POA: Diagnosis not present

## 2021-08-06 DIAGNOSIS — D509 Iron deficiency anemia, unspecified: Secondary | ICD-10-CM | POA: Diagnosis not present

## 2021-08-06 DIAGNOSIS — Z8679 Personal history of other diseases of the circulatory system: Secondary | ICD-10-CM | POA: Diagnosis not present

## 2021-08-06 DIAGNOSIS — B999 Unspecified infectious disease: Secondary | ICD-10-CM | POA: Diagnosis not present

## 2021-08-06 DIAGNOSIS — E871 Hypo-osmolality and hyponatremia: Secondary | ICD-10-CM | POA: Diagnosis not present

## 2021-08-06 LAB — CBC
HCT: 23.7 % — ABNORMAL LOW (ref 36.0–46.0)
Hemoglobin: 7.7 g/dL — ABNORMAL LOW (ref 12.0–15.0)
MCH: 23.3 pg — ABNORMAL LOW (ref 26.0–34.0)
MCHC: 32.5 g/dL (ref 30.0–36.0)
MCV: 71.8 fL — ABNORMAL LOW (ref 80.0–100.0)
Platelets: 265 10*3/uL (ref 150–400)
RBC: 3.3 MIL/uL — ABNORMAL LOW (ref 3.87–5.11)
RDW: 20.1 % — ABNORMAL HIGH (ref 11.5–15.5)
WBC: 17.6 10*3/uL — ABNORMAL HIGH (ref 4.0–10.5)
nRBC: 0 % (ref 0.0–0.2)

## 2021-08-06 LAB — BASIC METABOLIC PANEL
Anion gap: 6 (ref 5–15)
BUN: 10 mg/dL (ref 6–20)
CO2: 22 mmol/L (ref 22–32)
Calcium: 7.4 mg/dL — ABNORMAL LOW (ref 8.9–10.3)
Chloride: 107 mmol/L (ref 98–111)
Creatinine, Ser: 0.57 mg/dL (ref 0.44–1.00)
GFR, Estimated: >60 mL/min (ref >60)
Glucose, Bld: 112 mg/dL — ABNORMAL HIGH (ref 70–99)
Potassium: 4.1 mmol/L (ref 3.5–5.1)
Sodium: 135 mmol/L (ref 135–145)

## 2021-08-06 MED ORDER — LORAZEPAM 1 MG PO TABS
1.0000 mg | ORAL_TABLET | ORAL | Status: DC | PRN
  Administered 2021-08-06 – 2021-08-29 (×18): 1 mg via ORAL
  Filled 2021-08-06 (×20): qty 1

## 2021-08-06 MED ORDER — FERROUS GLUCONATE 324 (38 FE) MG PO TABS
324.0000 mg | ORAL_TABLET | Freq: Every day | ORAL | Status: DC
  Administered 2021-08-09 – 2021-09-17 (×40): 324 mg via ORAL
  Filled 2021-08-06 (×43): qty 1

## 2021-08-06 MED ORDER — DULOXETINE HCL 20 MG PO CPEP
20.0000 mg | ORAL_CAPSULE | Freq: Every day | ORAL | Status: DC
  Administered 2021-08-06 – 2021-08-14 (×9): 20 mg via ORAL
  Filled 2021-08-06 (×9): qty 1

## 2021-08-06 MED ORDER — OXYCODONE HCL 5 MG PO TABS
5.0000 mg | ORAL_TABLET | ORAL | Status: DC | PRN
  Administered 2021-08-06 – 2021-08-15 (×23): 5 mg via ORAL
  Filled 2021-08-06 (×24): qty 1

## 2021-08-06 NOTE — Progress Notes (Signed)
?Progress Note ? ? ?Patient: Latoya [name] [id number] DOB: 1985-11-09 DOA: 08/02/2021     4 ?DOS: the patient was seen and examined on 08/06/2021 ?  ?Brief hospital course: ?[name] was admitted to the hospital with the working diagnosis of severe sepsis due to tricuspid valve endocarditis, complicated with pulmonary septic emboli.  ? ?Latoya [name] is a 36 y.o. 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 She presented to hospital with complaints of severe low back pain for the last couple of weeks with no other symptoms.  Did complain of subjective fevers at home.  On her initial physical examination her blood pressure was 95/65, HR 97, RR 29 to 33, 02 saturation 97%, lungs clear to auscultation, no rales or wheezing, heart with S1 and S2 present and rhythmic with no gallops, rubs or murmurs, abdomen soft and non tender, no lower extremity edema.  Positive tenderness to palpation lumbar spine. Scabs at the IVDU site.  ? ?Na 127, K 2,4. CL 93, bicarbonate 21, glucose 108, bun 24 cr 0,97 ?AST 53, ALT 19  ?Lactic acid 2.2  ?WBC 31, hgb 10,4 hct 30,8, plt 199  ?Sed rate 86  ?Sars covid 19 negative. ?UA SG 1,024, 0-5 wbc, 30 protein.  ? ?Lumbar spine MRI with no signs of infection.  ? ?Blood cultures 4/4 bottles positive for staph aureus/ MRSA ?Chest radiograph with patchy infiltrates left upper and lower lobe, right upper lobe (mid lung).  ? ?EKG 101 bpm, normal axis, qtc 527, sinus rhythm, with no significant ST segment or T wave changes.  ? ?Patient was placed on antibiotic therapy. ?Transthoracic echocardiogram with possible vegetation in the tricuspid valve. ?CT chest with septic pulmonary embolisms. ? ?Continue to have intermittent fevers, clinically improving. ?Scheduled TEE for 04/04.  ? ?Assessment and Plan: ?*  Endocarditis of tricuspid valve ?Severe sepsis (present on admission), end organ failure hypotension.  ?Complicated with pulmonary septic emboli.  ?Staphylococcus bacteremia/ MRSA (03/29).  ? ?Echocardiogram with small oscillating density on tricuspid valve. LV EF 60 to 65%, preserved RV systolic function.  ?CT chest with extensive bilateral cavitary lung nodules compatible with septic emboli. Small bilateral pleural effusions with atelectasis.  ? ?Last fever spike was yesterday at 12:00 pm, 102.5.  ?Wbc is 17.4  ?Blood culture from 03/31 with no growth.  ? ?Plan to continue antibiotic therapy with IV Vancomycin.  ?Follow up on cell count and repeat blood cultures.  ? ?TEE scheduled for 4/4.  ? ?Hyponatremia ?Her oral intake is improving but not back to baseline. ?Her renal function is stable with serum cr at 0,57, K is 4,1 and serum bicarbonate at 22. Na is 135.  ?Corrected Ca is within normal range.  ? ?Plan to continue volume repletion with D5 and NS at 50 ml per hr. ?Continue nutritional supplements.  ?Follow up renal function in am, avoid hypotension, follow up vancomycin levels per pharmacy protocol.  ? ?History of bacterial endocarditis ?TV in 2020, MSSA, got angiovac at [facility]. ?Treated with Ancef at that time. ?Likely she has recurrent endocarditis.  ? ?Iron deficiency anemia ?Iron panel with serum iron of 22, transferrin saturation 7, ferritin 51 and TIBC 312. ? ?Follow up cell count with hgb at 7.7 hct 23,7 and plt 265.  ?Add oral iron daily.  ?Follow up cell count in am.  ? ?  Plan for IV iron when sepsis resolves.  ? ? ?IV drug abuse ([place]) ?Her mentation continue to improve, along with her oral intake. ?With hydromorphone her pain is better controlled.  ? ?Plan to continue with IV hydromorphone as needed for sever pain.  ?Ketorolac for moderate pain and acetaminophen for mild pain.  ?Patient has been using lorazepam for anxiety, possible withdrawal symptoms.  ?Now that her mentation has improved with change  formulation to po and continue with neuro checks per unit protocol. ?Keep on step down unit for now due to high risk for withdrawal symptoms.  ? ?Smoking cessation, nicotine patch.  ? ?Opioid use disorder ?Continue pain control with hydromorphone.  ? ? ? ? ? ?  ? ?Subjective: patient with improve mentation, mild somnolence, pain is better controlled, asking for more frequent pain medications  ? ?Physical Exam: ?Vitals:  ? 08/06/21 0400 08/06/21 0500 08/06/21 0600 08/06/21 0833  ?BP: (!) 127/92  113/70   ?Pulse: (!) 101  (!) 104   ?Resp: (!) 37  (!) 31   ?Temp:  99.9 ?F (37.7 ?C)  98.6 ?F (37 ?C)  ?TempSrc:  Oral  Oral  ?SpO2: 99%  96%   ?Weight:      ?Height:      ? ?Neurology somnolent but easy to arouse, following commands and able to respond to questions ?ENT with pallor but no icterus ?Cardiovascular with S1 and S2 present and rhythmic, with no gallops or murmurs ?Respiratory with no rales or wheezing ?Abdomen soft and non tender ?Trace non pitting lower extremity edema ?Skin with multiple ecchymosis at her lower extremities.  ?Data Reviewed: ? ? ? ?Family Communication: no family at the bedside  ? ?Disposition: ?Status is: Inpatient ?Remains inpatient appropriate because: IV antibiotic therapy, pending TEE  ? Planned Discharge Destination: Home ? ? ? ?Author: ?Tawni Millers, MD ?08/06/2021 9:28 AM ? ?For on call review [email or web address].  ?

## 2021-08-06 NOTE — Evaluation (Signed)
Occupational Therapy Evaluation ?Patient Details ?Name: Latoya [name] ?MRN: [id number] ?DOB: [birth year] ?Today's Date: 08/06/2021 ? ? ?History of Present Illness Latoya DecampHeather Burgener is a 36 y.o. female with medical history significant of IVDU, heroin abuse, ongoing. Admitted with back pain, hypotension, hyponatremia, leukocytosis and neutrophilia. Found to have bacteremia possible sepitc pulmonary emboli from endocarditis.  ? ?Clinical Impression ?  ?Patient is a 36 year old female who was admitted for above. Patient was noted to have increased pain impacting participation in ADLs and time out of bed. Patient was noted to have all pain medication on board at time of session. Patient was able to tolerate sitting on edge of bed for about 30 seconds prior to strongly requesting to get back into bed. Patient was noted to have decreased functional activity tolerance, decreased orientation,decreased safety awareness, decreased knowledge of AD/AE, decreased standing balance, increased pain impacting participation in ADLs.  Patient would continue to benefit from skilled OT services at this time while admitted and after d/c to address noted deficits in order to improve overall safety and independence in ADLs.  ?Patients blood pressures: ?131/85 mmhg in bed  ?129/93 mmhg after movement back into bed. Nurse made aware ?   ? ?Recommendations for follow up therapy are one component of a multi-disciplinary discharge planning process, led by the attending physician.  Recommendations may be updated based on patient status, additional functional criteria and insurance authorization.  ? ?Follow Up Recommendations ? Acute inpatient rehab (3hours/day)  ?  ?Assistance Recommended at Discharge Frequent or constant Supervision/Assistance  ?Patient can return home with the following A lot of help with bathing/dressing/bathroom;Two people to help with walking and/or transfers;Assistance with cooking/housework;Direct supervision/assist for  medications management;Assist for transportation;Help with stairs or ramp for entrance;Direct supervision/assist for financial management ? ?  ?Functional Status Assessment ? Patient has had a recent decline in their functional status and demonstrates the ability to make significant improvements in function in a reasonable and predictable amount of time.  ?Equipment Recommendations ? Other (comment) (defer to next venue)  ?  ?Recommendations for Other Services   ? ? ?  ?Precautions / Restrictions Precautions ?Precautions: Fall ?Precaution Comments: monitor HR, ?Restrictions ?Weight Bearing Restrictions: No  ? ?  ? ?Mobility Bed Mobility ?Overal bed mobility: Needs Assistance ?Bed Mobility: Supine to Sit, Sit to Supine ?  ?  ?Supine to sit: Max assist, HOB elevated ?  ?  ?General bed mobility comments: with multimodal cues for proper positioning and sequencing of task to reduce pressure on back. patient needed physical assistance to get BLE back into bed. ?  ? ?Transfers ?  ?  ?  ?  ?  ?  ?  ?  ?  ?  ?  ? ?  ?Balance Overall balance assessment: Needs assistance ?Sitting-balance support: Feet unsupported ?Sitting balance-Leahy Scale: Fair ?Sitting balance - Comments: for 30 seconds prior to strongly requesting to get back in bed. ?  ?  ?  ?  ?  ?  ?  ?  ?  ?  ?  ?  ?  ?  ?  ?   ? ?ADL either performed or assessed with clinical judgement  ? ?ADL Overall ADL's : Needs assistance/impaired ?Eating/Feeding: Bed level;Set up ?Eating/Feeding Details (indicate cue type and reason): able to reach and collect cup from across body on bedside table and take drinks. ?Grooming: Therapist, nutritionalWash/dry face;Bed level;Set up ?Grooming Details (indicate cue type and reason): patient has the ROM in BUE to complete task with patient choosing  not to complete on this date. patient was educated on importance of participation in UB dressing tasks and grooming herself to maintain ability to engage in ADLs and prevent learned helplessness. patient declined  to complete grooming and hygiene tasks on this date. ?Upper Body Bathing: Maximal assistance;Bed level ?Upper Body Bathing Details (indicate cue type and reason): patient declined to complete ?Lower Body Bathing: Bed level;Maximal assistance ?  ?Upper Body Dressing : Bed level;Maximal assistance ?  ?Lower Body Dressing: Bed level;Maximal assistance ?  ?Toilet Transfer: +2 for physical assistance;+2 for safety/equipment ?Toilet Transfer Details (indicate cue type and reason): patient participated in sitting on edge of bed for about 30 seconds with patient reporting she felt like passing out. patient declined to sit to attemtp BP attempt with patient transitioned to supine with mod A with multimodal cues. patient's BP in supine was 129/93 mmhg. patient declined to attempt again ?Toileting- Clothing Manipulation and Hygiene: Maximal assistance;Bed level ?  ?  ?  ?Functional mobility during ADLs: +2 for physical assistance;+2 for safety/equipment ?   ? ? ? ?Vision Patient Visual Report: No change from baseline ?   ?   ?Perception   ?  ?Praxis   ?  ? ?Pertinent Vitals/Pain Pain Assessment ?Pain Assessment: 0-10 ?Pain Score: 10-Worst pain ever ?Pain Location: back, chest and legs. ?Pain Descriptors / Indicators: Grimacing, Guarding ?Pain Intervention(s): Premedicated before session, Monitored during session, Limited activity within patient's tolerance  ? ? ? ?Hand Dominance Right ?  ?Extremity/Trunk Assessment Upper Extremity Assessment ?Upper Extremity Assessment: RUE deficits/detail;LUE deficits/detail ?RUE Deficits / Details: patient at first declined to participate in ROM reporting pain was too much with all pain medication on board. patient was then able to demonstrate [facility] elbow and hand ROM.patient was able to range shoulders against gravity to about 90 degrees of flexion. unable to test MMT with patients current pain level ?LUE Deficits / Details: patient at first declined to participate in ROM reporting pain was  too much with all pain medication on board. patient was then able to demonstrate [facility] elbow and hand ROM. patient was able to range shoulders against gravity to about 90 degrees of flexion ?  ?Lower Extremity Assessment ?Lower Extremity Assessment: Defer to PT evaluation ?  ?Cervical / Trunk Assessment ?Cervical / Trunk Assessment: Normal ?  ?Communication Communication ?Communication: No difficulties ?  ?Cognition Arousal/Alertness: Awake/alert ?  ?  ?  ?  ?  ?  ?  ?  ?  ?  ?  ?  ?  ?  ?  ?  ?  ?  ?General Comments: patient is oriented to self and year, replied I dont know to other questions. ?  ?  ?General Comments    ? ?  ?Exercises   ?  ?Shoulder Instructions    ? ? ?Home Living   ?  ?  ?  ?  ?  ?  ?  ?  ?  ?  ?  ?  ?  ?  ?  ?  ?  ?  ? ?  ?Prior Functioning/Environment Prior Level of Function : Needs assist ?  ?  ?  ?Physical Assist : ADLs (physical) ?  ?  ?  ?ADLs Comments: patient reported having a friend who helped with ADL tasks when needed. patient reported about 2 months ago was able to complete ADLs herself with no AD. ?  ? ?  ?  ?OT Problem List: Decreased activity tolerance;Decreased knowledge of use of DME or AE;Decreased  safety awareness ?  ?   ?OT Treatment/Interventions: Self-care/ADL training;DME and/or AE instruction;Therapeutic activities;Balance training;Therapeutic exercise;Neuromuscular education;Energy conservation;Patient/family education  ?  ?OT Goals(Current goals can be found in the care plan section) Acute Rehab OT Goals ?Patient Stated Goal: to stay in bed ?OT Goal Formulation: With patient ?Time For Goal Achievement: 08/20/21 ?Potential to Achieve Goals: Good  ?OT Frequency: Min 2X/week ?  ? ?Co-evaluation   ?  ?  ?  ?  ? ?  ?AM-PAC OT "6 Clicks" Daily Activity     ?Outcome Measure Help from another person eating meals?: A Little ?Help from another person taking care of personal grooming?: A Little ?Help from another person toileting, which includes using toliet, bedpan, or urinal?:  Total ?Help from another person bathing (including washing, rinsing, drying)?: Total ?Help from another person to put on and taking off regular upper body clothing?: Total ?Help from another person to put on and

## 2021-08-07 DIAGNOSIS — Z8679 Personal history of other diseases of the circulatory system: Secondary | ICD-10-CM | POA: Diagnosis not present

## 2021-08-07 DIAGNOSIS — E871 Hypo-osmolality and hyponatremia: Secondary | ICD-10-CM | POA: Diagnosis not present

## 2021-08-07 DIAGNOSIS — I079 Rheumatic tricuspid valve disease, unspecified: Secondary | ICD-10-CM | POA: Diagnosis not present

## 2021-08-07 DIAGNOSIS — D509 Iron deficiency anemia, unspecified: Secondary | ICD-10-CM | POA: Diagnosis not present

## 2021-08-07 LAB — CBC
HCT: 23.5 % — ABNORMAL LOW (ref 36.0–46.0)
Hemoglobin: 7.4 g/dL — ABNORMAL LOW (ref 12.0–15.0)
MCH: 22.9 pg — ABNORMAL LOW (ref 26.0–34.0)
MCHC: 31.5 g/dL (ref 30.0–36.0)
MCV: 72.8 fL — ABNORMAL LOW (ref 80.0–100.0)
Platelets: 297 10*3/uL (ref 150–400)
RBC: 3.23 MIL/uL — ABNORMAL LOW (ref 3.87–5.11)
RDW: 20.7 % — ABNORMAL HIGH (ref 11.5–15.5)
WBC: 15.5 10*3/uL — ABNORMAL HIGH (ref 4.0–10.5)
nRBC: 0 % (ref 0.0–0.2)

## 2021-08-07 LAB — BASIC METABOLIC PANEL
Anion gap: 5 (ref 5–15)
BUN: 17 mg/dL (ref 6–20)
CO2: 24 mmol/L (ref 22–32)
Calcium: 7.7 mg/dL — ABNORMAL LOW (ref 8.9–10.3)
Chloride: 106 mmol/L (ref 98–111)
Creatinine, Ser: 0.53 mg/dL (ref 0.44–1.00)
GFR, Estimated: >60 mL/min (ref >60)
Glucose, Bld: 109 mg/dL — ABNORMAL HIGH (ref 70–99)
Potassium: 4.4 mmol/L (ref 3.5–5.1)
Sodium: 135 mmol/L (ref 135–145)

## 2021-08-07 LAB — VANCOMYCIN, PEAK: Vancomycin Pk: 38 ug/mL (ref 30–40)

## 2021-08-07 NOTE — Progress Notes (Cosign Needed)
Inpatient Rehab Admissions Coordinator:  ? ?Per therapy recommendations,  patient was screened for CIR candidacy by Megan Salon, MS, CCC-SLP ?  At this time, Pt. Remains pain limited and  is not medically ready for CIR. I will not pursue a rehab consult for this Pt. at this time, but CIR admissions team will follow and monitor for medical readiness and place consult order if Pt. appears to be an appropriate candidate. Please contact me with any questions.  ? ? ?Megan Salon, MS, CCC-SLP ?Rehab Admissions Coordinator  ?[phone] (celll) ?[phone] (office) ? ?

## 2021-08-07 NOTE — Progress Notes (Signed)
Patient has called out frequently, requesting pain medication.  I explained about the pain medication what I gave her and when she can have it next.  ?

## 2021-08-07 NOTE — Progress Notes (Signed)
Pt  constantly on call light, demanding more pain medication every 10-20 minutes. Pt also demanding to have all her pain medication together. Pain medication administered per ordered.  Explained to the patient how often she can have her medication. Pt was very irritable, anxious and yelling out to staff. Administered PRN Ativan x2. Charge Nurse notified. Will continue to monitor.  ?

## 2021-08-07 NOTE — TOC Progression Note (Signed)
Transition of Care (TOC) - Progression Note  ? ? ?Patient Details  ?Name: Latoya [name] ?MRN: [id number] ?Date of Birth: 1985-07-28 ? ?Transition of Care (TOC) CM/SW Contact  ?Lanier Clam, RN ?Phone Number: ?08/07/2021, 10:27 AM ? ?Clinical Narrative:  Noted CIR following-await recc.  ? ? ? ?Expected Discharge Plan: IP Rehab Facility ?Barriers to Discharge: Continued Medical Work up ? ?Expected Discharge Plan and Services ?Expected Discharge Plan: IP Rehab Facility ?In-house Referral: Clinical Social Work ?  ?  ?Living arrangements for the past 2 months: No permanent address ?                ?  ?  ?  ?  ?  ?  ?  ?  ?  ?  ? ? ?Social Determinants of Health (SDOH) Interventions ?  ? ?Readmission Risk Interventions ?   ? View : No data to display.  ?  ?  ?  ? ? ?

## 2021-08-07 NOTE — Progress Notes (Signed)
PT Cancellation Note ? ?Patient Details ?Name: Latoya [name] ?MRN: [id number] ?DOB: 11/08/1985 ? ? ?Cancelled Treatment:    Reason Eval/Treat Not Completed: Pain limiting ability to participate (pt stated she's in too much pain to attempt mobility. Will follow.) ? ?Ralene Bathe Kistler PT 08/07/2021  ?Acute Rehabilitation Services ?Pager [phone] ?Office [phone] ? ?

## 2021-08-07 NOTE — Progress Notes (Signed)
? ?  [facility] has been requested to perform a transesophageal echocardiogram on Chiann Capurro for further evaluation of tricuspid endocarditis.  After careful review of history and examination, the risks and benefits of transesophageal echocardiogram have been explained including risks of esophageal damage, perforation (1:10,000 risk), bleeding, pharyngeal hematoma as well as other potential complications associated with conscious sedation including aspiration, arrhythmia, respiratory failure and death. Alternatives to treatment were discussed, questions were answered. Patient is currently very lethargic and I had to wake her up multiple times while explaining procedure. She is alert and oriented x4 but when I asked her to explain the procedure to me she said we were going to go in through her groin. She received Dialaudid earlier this afternoon so this may be contributing to her current lethargy. I do not think she is able to consent at this time. Patient gave me permission to call her mom Clydie Braun but I was unable to reach her. I left message asking her to call nurses station back. I will go ahead and make patient NPO at midnight but our team will need to try to re-consent her tomorrow. ? ?Corrin Parker, PA-C ?08/07/2021 4:10 PM  ? ?

## 2021-08-07 NOTE — Progress Notes (Signed)
? ?[facility] Infectious Diseases Follow Up Note ? ?Patient Identification: ?Patient Name: Latoya DecampHeather [name] MRN: [id number] Admit Date: 08/02/2021  1:58 PM ?Age: 36 y.o.Today's Date: 08/07/2021 ? ? ?Reason for Visit: endocarditis  ? ?Principal Problem: ?  Endocarditis of tricuspid valve ?Active Problems: ?  Iron deficiency anemia ?  Opioid use disorder ?  IV drug abuse (HCC) ?  History of bacterial endocarditis ?  Hyponatremia ? ?Antibiotics:  ?Vancomycin 3/29-c ?Cefepime 3/29-3/30 ?Metronidazole 3/29-3/30 ?  ?Lines/Hardware: ?  ?Interval Events: afebrile. CT chest and MRI findings noted  ? ?Assessment ?# High grade MRSA bacteremia with septic pulmonary emboli/Endocarditis ?- 3/31 blood cx [facility] ?- 4/2 blood cx sent ? ?# Leukocytosis - downtrending ?# Lower back pain-MRI T-L-spine negative for discitis osteomyelitis.  ?# Active IVDU- Hep B surface ag negative, HCV RNA negative, HIV negative  ?# Smoker ?  ?Others ?# MSSA TV endocarditis in 202- treated with cefazolin and [facility]ac [facility] ?# MRSA L4-L5 facet joint septic arthritis in 07/2019( IV Vancomycin >>Linezolid  on discharge? Compliance)  ?-Opioid use  ? ? ?Recommendations ?Continue vancomycin,  pharmacy to dose ?FU repeat blood cultures  ?TEE on 4/4 ?Monitor CBC, BMP and Vancomycin trough  ?Management of opioid use and withdrawal per primary team  ? ?Rest of the management as per the primary team. ?Thank you for the consult. Please page with pertinent questions or concerns. ? ?______________________________________________________________________ ?Subjective ?patient seen and examined at the bedside.  ?Complains of poor sleep and generalized body pain  ? ?Vitals ?BP 111/80 (BP Location: Left Arm)   Pulse 96   Temp 98.3 ?F (36.8 ?C)   Resp 18   Ht 5\' 6"  (1.676 m)   Wt 82.2 kg   SpO2 96%   BMI 29.25 kg/m?  ? ?  ?Physical Exam ?Constitutional:  not in acute distress, sleeping with eyes covered  ?   Comments:   ? ?Cardiovascular:  ?   Rate and Rhythm: Normal rate and regular rhythm.  ?   Heart sounds:  ? ?Pulmonary:  ?   Effort: Pulmonary effort is normal.  ?   Comments:  ? ?Abdominal:  ?   Palpations: Abdomen is soft.  ?   Tenderness: non distended  ? ?Musculoskeletal:     ?   General: Chronic appearing excoriations in lower extremities  ? ?Skin: ?   Comments:  ? ?Neurological:  ?   General: Grossly non focal, drowsy ( arousable to voice) ? ?Pertinent Microbiology ?Results for orders placed or performed during the hospital encounter of 08/02/21  ?Blood culture (routine x 2)     Status: Abnormal  ? Collection Time: 08/02/21  2:40 PM  ? Specimen: BLOOD  ?Result Value Ref Range Status  ? Specimen Description   Final  ?  BLOOD LEFT ANTECUBITAL ?Performed at [facility], [address] [place], [place] [address] ?  ? Special Requests   Final  ?  BOTTLES DRAWN AEROBIC AND ANAEROBIC Blood Culture results may not be optimal due to an excessive volume of blood received in culture bottles ?Performed at [facility], [address] [place], [place] [address] ?  ? Culture  Setup Time   Final  ?  GRAM POSITIVE COCCI IN CLUSTERS ?IN BOTH AEROBIC AND ANAEROBIC BOTTLES ?CRITICAL RESULT CALLED TO, READ BACK BY AND VERIFIED WITH: PHARMD TERRY GREEN 08/03/21 @ 9:31 BY DRT ?Performed at [facility], [address] [place], [place] [address] ?  ? Culture METHICILLIN RESISTANT STAPHYLOCOCCUS  AUREUS (A)  Final  ? Report Status 08/05/2021 FINAL  Final  ? Organism ID, Bacteria METHICILLIN RESISTANT STAPHYLOCOCCUS AUREUS  Final  ?    Susceptibility  ? Methicillin resistant staphylococcus aureus - MIC*  ?  CIPROFLOXACIN >=8 RESISTANT Resistant   ?  ERYTHROMYCIN >=8 RESISTANT Resistant   ?  GENTAMICIN <=0.5 SENSITIVE Sensitive   ?  OXACILLIN >=4 RESISTANT Resistant   ?  TETRACYCLINE >=16 RESISTANT Resistant   ?  VANCOMYCIN 1 SENSITIVE Sensitive   ?  TRIMETH/SULFA >=320 RESISTANT Resistant   ?   CLINDAMYCIN <=0.25 SENSITIVE Sensitive   ?  RIFAMPIN <=0.5 SENSITIVE Sensitive   ?  Inducible Clindamycin NEGATIVE Sensitive   ?  * METHICILLIN RESISTANT STAPHYLOCOCCUS AUREUS  ?Blood Culture ID Panel (Reflexed)     Status: Abnormal  ? Collection Time: 08/02/21  2:40 PM  ?Result Value Ref Range Status  ? Enterococcus faecalis NOT DETECTED NOT DETECTED Final  ? Enterococcus Faecium NOT DETECTED NOT DETECTED Final  ? Listeria monocytogenes NOT DETECTED NOT DETECTED Final  ? Staphylococcus species DETECTED (A) NOT DETECTED Final  ?  Comment: CRITICAL RESULT CALLED TO, READ BACK BY AND VERIFIED WITH: ?PHARMD TERRY GREEN 08/03/21 @ 9:32 BY DRT ?  ? Staphylococcus aureus (BCID) DETECTED (A) NOT DETECTED Final  ?  Comment: Methicillin (oxacillin)-resistant Staphylococcus aureus (MRSA). MRSA is predictably resistant to beta-lactam antibiotics (except ceftaroline). Preferred therapy is vancomycin unless clinically contraindicated. Patient requires contact precautions if  ?hospitalized. ?CRITICAL RESULT CALLED TO, READ BACK BY AND VERIFIED WITH: ?PHARMD TERRY GREEN 08/03/21 @ 9:32 BY DRT ?  ? Staphylococcus epidermidis NOT DETECTED NOT DETECTED Final  ? Staphylococcus lugdunensis NOT DETECTED NOT DETECTED Final  ? Streptococcus species NOT DETECTED NOT DETECTED Final  ? Streptococcus agalactiae NOT DETECTED NOT DETECTED Final  ? Streptococcus pneumoniae NOT DETECTED NOT DETECTED Final  ? Streptococcus pyogenes NOT DETECTED NOT DETECTED Final  ? A.calcoaceticus-baumannii NOT DETECTED NOT DETECTED Final  ? Bacteroides fragilis NOT DETECTED NOT DETECTED Final  ? Enterobacterales NOT DETECTED NOT DETECTED Final  ? Enterobacter cloacae complex NOT DETECTED NOT DETECTED Final  ? Escherichia coli NOT DETECTED NOT DETECTED Final  ? Klebsiella aerogenes NOT DETECTED NOT DETECTED Final  ? Klebsiella oxytoca NOT DETECTED NOT DETECTED Final  ? Klebsiella pneumoniae NOT DETECTED NOT DETECTED Final  ? Proteus species NOT DETECTED NOT  DETECTED Final  ? Salmonella species NOT DETECTED NOT DETECTED Final  ? Serratia marcescens NOT DETECTED NOT DETECTED Final  ? Haemophilus influenzae NOT DETECTED NOT DETECTED Final  ? Neisseria meningitidis NOT DETECTED NOT DETECTED Final  ? Pseudomonas aeruginosa NOT DETECTED NOT DETECTED Final  ? Stenotrophomonas maltophilia NOT DETECTED NOT DETECTED Final  ? Candida albicans NOT DETECTED NOT DETECTED Final  ? Candida auris NOT DETECTED NOT DETECTED Final  ? Candida glabrata NOT DETECTED NOT DETECTED Final  ? Candida krusei NOT DETECTED NOT DETECTED Final  ? Candida parapsilosis NOT DETECTED NOT DETECTED Final  ? Candida tropicalis NOT DETECTED NOT DETECTED Final  ? Cryptococcus neoformans/gattii NOT DETECTED NOT DETECTED Final  ? Meth resistant mecA/C and MREJ DETECTED (A) NOT DETECTED Final  ?  Comment: CRITICAL RESULT CALLED TO, READ BACK BY AND VERIFIED WITH: ?PHARMD TERRY GREEN 08/03/21 @ 9:32 BY DRT ?Performed at [facility], [address] [place], [place] [address] ?  ?Blood culture (routine x 2)     Status: Abnormal  ? Collection Time: 08/02/21  5:09 PM  ? Specimen: BLOOD  ?Result Value Ref  Range Status  ? Specimen Description   Final  ?  BLOOD BLOOD RIGHT ARM ?Performed at [facility], [address] [place], [place] [address] ?  ? Special Requests   Final  ?  BOTTLES DRAWN AEROBIC AND ANAEROBIC Blood Culture adequate volume ?Performed at [facility], [address], [place], [place] [address] ?  ? Culture  Setup Time   Final  ?  GRAM POSITIVE COCCI IN CLUSTERS ?IN BOTH AEROBIC AND ANAEROBIC BOTTLES ?  ? Culture (A)  Final  ?  STAPHYLOCOCCUS AUREUS ?SUSCEPTIBILITIES PERFORMED ON PREVIOUS CULTURE WITHIN THE LAST 5 DAYS. ?Performed at [facility], [address], [place], [place] [address] ?  ? Report Status 08/05/2021 FINAL  Final  ?Resp Panel by RT-PCR (Flu A&B, Covid) Nasopharyngeal Swab     Status: None  ? Collection Time: 08/02/21  9:33 PM  ?  Specimen: Nasopharyngeal Swab; Nasopharyngeal(NP) swabs in vial transport medium  ?Result Value Ref Range Status  ? SARS Coronavirus 2 by RT PCR NEGATIVE NEGATIVE Final  ?  Comment: (NOTE) ?SARS-CoV-2 target nucleic acids are NOT

## 2021-08-07 NOTE — Progress Notes (Addendum)
?PROGRESS NOTE ? ? ? ?Dea Bitting  XFG:[id number] DOB: 1986/01/11 DOA: 08/02/2021 ?PCP: Pcp, No  ? ? ?Brief Narrative:  ? ?Tereka Thorley is a 36 y.o. female with past medical history of IV drug abuse, heroin abuse, history of MSSA tricuspid valve endocarditis in 2020 treated with Ancef and AngioJet at [facility], history of MRSA discitis and osteomyelitis of the lumbar spine in 2021 presented to hospital with severe low back pain for couple of weeks and subjective fevers.  In the ED patient was noted to have hyponatremia, hypokalemia, significant leukocytosis and neutrophilia.  Chest x-ray showed new patchy opacities.  UA showed white cells.  COVID and influenza was negative.  MRSA PCR was positive.  Patient was then admitted hospital for sepsis with hypotension with multifocal pneumonia.  This time, lumbar spine MRI with no signs of infection. Blood cultures 4/4 bottles positive for staph aureus/ MRSA.  EKG showed sinus tachycardia. Patient was placed on antibiotic therapy. Transthoracic echocardiogram with possible vegetation in the tricuspid valve. CT chest with septic pulmonary embolisms.  Patient continued to have fever during hospitalization.  Currently scheduled for TEE on 08/08/2021. ? ?  ?Assessment and Plan: ? ?Principal Problem: ?  Endocarditis of tricuspid valve ?Active Problems: ?  Hyponatremia ?  History of bacterial endocarditis ?  Iron deficiency anemia ?  IV drug abuse (HCC) ?  Opioid use disorder ?  ?Endocarditis of tricuspid valve ?Previous history of tricuspid valve bacterial endocarditis status post Ancef and angio VAC treatment at [facility].  Recurrent endocarditis at this time.  MRSA from culture 3/29.  2D echocardiogram with a small oscillating density in the tricuspid valve.  Plan for TEE on 08/08/2021.  CT chest with extensive bilateral cavitary lung nodules compatible with septic emboli with small pleural effusion and atelectasis.  Patient continues to have a spike  of fever or leukocytosis.  On IV vancomycin. TEE scheduled for 4/4.  ? ?Hyponatremia ?Improved.  Latest sodium of 135.  Continue to monitor.  On D5 and NS at 50 ml per hr. ? ?Iron deficiency anemia ?Iron panel with serum iron of 22, transferrin saturation 7, ferritin 51 and TIBC 312.  On oral iron. ?Plan for IV iron when sepsis resolves.  ? ?IV drug abuse (HCC) ?On Dilaudid for severe pain and Toradol for mild to moderate pain.  As needed Ativan for anxiety ?Continue nicotine patch.  ? ?Opioid use disorder ?Continue pain control with hydromorphone.  ? ? DVT prophylaxis: enoxaparin (LOVENOX) injection 40 mg Start: 08/02/21 2200 ? ? ?Code Status:   ?  Code Status: Full Code ? ?Disposition: Home ?Status is: Inpatient ?Remains inpatient appropriate because: MRSA infection, endocarditis IV antibiotic, need for TEE ? Family Communication: Spoke with the patient at bedside ? ?Consultants:  ?Infectious disease ? ?Procedures:  ?Echocardiogram ? ?Antimicrobials:  ?Vancomycin ? ?Anti-infectives (From admission, onward)  ? ? Start     Dose/Rate Route Frequency Ordered Stop  ? 08/05/21 1800  vancomycin (VANCOREADY) IVPB 1500 mg/300 mL       ? 1,500 mg ?150 mL/hr over 120 Minutes Intravenous Every 12 hours 08/05/21 0618    ? 08/04/21 1700  vancomycin (VANCOREADY) IVPB 1250 mg/250 mL  Status:  Discontinued       ? 1,250 mg ?166.7 mL/hr over 90 Minutes Intravenous Every 12 hours 08/04/21 1613 08/05/21 0618  ? 08/03/21 1700  vancomycin (VANCOCIN) IVPB 1000 mg/200 mL premix  Status:  Discontinued       ? 1,000 mg ?200 mL/hr  over 60 Minutes Intravenous Every 12 hours 08/03/21 1039 08/03/21 1042  ? 08/03/21 1700  Vancomycin (VANCOCIN) 1,250 mg in sodium chloride 0.9 % 250 mL IVPB  Status:  Discontinued       ? 1,250 mg ?166.7 mL/hr over 90 Minutes Intravenous Every 12 hours 08/03/21 1042 08/03/21 1111  ? 08/03/21 1700  Vancomycin (VANCOCIN) 1,250 mg in sodium chloride 0.9 % 250 mL IVPB  Status:  Discontinued       ? 1,250 mg ?166.7  mL/hr over 90 Minutes Intravenous Every 12 hours 08/03/21 1112 08/03/21 1124  ? 08/03/21 1700  Vancomycin (VANCOCIN) 1,250 mg in sodium chloride 0.9 % 250 mL IVPB  Status:  Discontinued       ? 1,250 mg ?175 mL/hr over 90 Minutes Intravenous Every 12 hours 08/03/21 1124 08/04/21 1614  ? 08/03/21 0600  vancomycin (VANCOCIN) IVPB 750 mg/150 ml premix  Status:  Discontinued       ? 750 mg ?150 mL/hr over 60 Minutes Intravenous Every 12 hours 08/02/21 2143 08/03/21 0020  ? 08/03/21 0600  vancomycin (VANCOREADY) IVPB 750 mg/150 mL  Status:  Discontinued       ? 750 mg ?150 mL/hr over 60 Minutes Intravenous Every 12 hours 08/03/21 0022 08/03/21 1039  ? 08/03/21 0200  ceFEPIme (MAXIPIME) 2 g in sodium chloride 0.9 % 100 mL IVPB  Status:  Discontinued       ? 2 g ?200 mL/hr over 30 Minutes Intravenous Every 8 hours 08/02/21 2138 08/03/21 0946  ? 08/02/21 2200  metroNIDAZOLE (FLAGYL) IVPB 500 mg  Status:  Discontinued       ? 500 mg ?100 mL/hr over 60 Minutes Intravenous Every 12 hours 08/02/21 2135 08/03/21 0946  ? 08/02/21 1545  vancomycin (VANCOCIN) IVPB 1000 mg/200 mL premix       ? 1,000 mg ?200 mL/hr over 60 Minutes Intravenous  Once 08/02/21 1537 08/02/21 2138  ? 08/02/21 1545  ceFEPIme (MAXIPIME) 2 g in sodium chloride 0.9 % 100 mL IVPB       ? 2 g ?200 mL/hr over 30 Minutes Intravenous  Once 08/02/21 1537 08/02/21 1933  ? ?  ? ? ?Subjective: ?Today, patient was seen and examined at bedside.  Patient complains of aches in her body.  Has mild shortness of breath and headache. ? ?Objective: ?Vitals:  ? 08/06/21 2249 08/07/21 0249 08/07/21 1020 08/07/21 1022  ?BP: 110/80 111/80  120/85  ?Pulse: 97 96 (!) 110 (!) 110  ?Resp: (!) 21 18  18   ?Temp: 98.3 ?F (36.8 ?C) 98.3 ?F (36.8 ?C)  100 ?F (37.8 ?C)  ?TempSrc:    Oral  ?SpO2: 98% 96% 96% 96%  ?Weight:      ?Height:      ? ? ?Intake/Output Summary (Last 24 hours) at 08/07/2021 1023 ?Last data filed at 08/07/2021 1008 ?Gross per 24 hour  ?Intake 1384.39 ml  ?Output 3700 ml   ?Net -2315.61 ml  ? ?Filed Weights  ? 08/02/21 1412 08/02/21 2318 08/03/21 1036  ?Weight: 68 kg 83 kg 82.2 kg  ? ? ?Physical Examination: ? ?General:  Average built, not in obvious distress, closing eyes ?HENT:   Pallor noted oral mucosa is moist.  ?Chest:   Diminished breath sounds bilaterally.  ?CVS: S1 &S2 heard. No murmur.  Regular rate and rhythm. ?Abdomen: Soft, nontender, nondistended.  Bowel sounds are heard.   ?Extremities: No cyanosis, clubbing or edema.  Peripheral pulses are palpable. ?Psych: Alert, awake and oriented, normal mood ?CNS:  No cranial nerve deficits.  Power equal in all extremities.   ?Skin: Warm and dry.  Scabs over the lower extremities  ? ?Data Reviewed:  ? ?CBC: ?Recent Labs  ?Lab 08/02/21 ?1442 08/03/21 ?4944 08/04/21 ?9675 08/05/21 ?0425 08/06/21 ?9163 08/07/21 ?0354  ?WBC 31.7* 24.0* 22.0* 17.4* 17.6* 15.5*  ?NEUTROABS 27.6*  --   --  13.6*  --   --   ?HGB 10.4* 8.2* 8.2* 8.5* 7.7* 7.4*  ?HCT 30.8* 25.6* 26.4* 25.9* 23.7* 23.5*  ?MCV 69.2* 70.3* 72.7* 70.2* 71.8* 72.8*  ?PLT 199 170 211 231 265 297  ? ? ?Basic Metabolic Panel: ?Recent Labs  ?Lab 08/02/21 ?1442 08/03/21 ?8466 08/04/21 ?5993 08/05/21 ?0425 08/06/21 ?5701 08/07/21 ?0354  ?NA 127* 129* 130* 133* 135 135  ?K 2.4* 2.8* 4.2 4.2 4.1 4.4  ?CL 93* 101 105 105 107 106  ?CO2 21* 20* 20* 22 22 24   ?GLUCOSE 108* 131* 95 113* 112* 109*  ?BUN 25* 22* 13 13 10 17   ?CREATININE 0.97 0.82 0.65 0.63 0.57 0.53  ?CALCIUM 7.8* 7.8* 7.5* 7.6* 7.4* 7.7*  ?MG 2.1  --  2.1 2.1  --   --   ? ? ?Liver Function Tests: ?Recent Labs  ?Lab 08/02/21 ?1442 08/04/21 ?0627  ?AST 53* 30  ?ALT 19 15  ?ALKPHOS 77 60  ?BILITOT 0.8 0.7  ?PROT 8.2* 6.3*  ?ALBUMIN 2.7* 2.0*  ? ? ? ?Radiology Studies: ?No results found. ? ? ? LOS: 5 days  ? ? ?08/04/21, MD ?[facility] ?08/07/2021, 10:23 AM  ?  ?

## 2021-08-08 ENCOUNTER — Inpatient Hospital Stay (HOSPITAL_COMMUNITY): Payer: Medicaid Other | Admitting: General Practice

## 2021-08-08 ENCOUNTER — Encounter (HOSPITAL_COMMUNITY)
Admission: EM | Disposition: A | Discharge status: Home / Self Care | Payer: Self-pay | Source: Home / Self Care | Attending: Family Medicine

## 2021-08-08 ENCOUNTER — Inpatient Hospital Stay (HOSPITAL_COMMUNITY)
Admit: 2021-08-08 | Discharge: 2021-08-08 | Disposition: A | Discharge status: Home / Self Care | Payer: Medicaid Other | Attending: Cardiology | Admitting: Cardiology

## 2021-08-08 ENCOUNTER — Encounter (HOSPITAL_COMMUNITY): Payer: Self-pay | Admitting: Internal Medicine

## 2021-08-08 DIAGNOSIS — I2699 Other pulmonary embolism without acute cor pulmonale: Secondary | ICD-10-CM

## 2021-08-08 DIAGNOSIS — E871 Hypo-osmolality and hyponatremia: Secondary | ICD-10-CM | POA: Diagnosis not present

## 2021-08-08 DIAGNOSIS — I38 Endocarditis, valve unspecified: Secondary | ICD-10-CM | POA: Diagnosis not present

## 2021-08-08 DIAGNOSIS — I361 Nonrheumatic tricuspid (valve) insufficiency: Secondary | ICD-10-CM | POA: Diagnosis not present

## 2021-08-08 DIAGNOSIS — Z8679 Personal history of other diseases of the circulatory system: Secondary | ICD-10-CM | POA: Diagnosis not present

## 2021-08-08 DIAGNOSIS — I088 Other rheumatic multiple valve diseases: Secondary | ICD-10-CM | POA: Diagnosis not present

## 2021-08-08 DIAGNOSIS — I071 Rheumatic tricuspid insufficiency: Secondary | ICD-10-CM | POA: Diagnosis not present

## 2021-08-08 DIAGNOSIS — F17210 Nicotine dependence, cigarettes, uncomplicated: Secondary | ICD-10-CM | POA: Diagnosis not present

## 2021-08-08 DIAGNOSIS — M1990 Unspecified osteoarthritis, unspecified site: Secondary | ICD-10-CM

## 2021-08-08 DIAGNOSIS — Z954 Presence of other heart-valve replacement: Secondary | ICD-10-CM | POA: Diagnosis not present

## 2021-08-08 DIAGNOSIS — D509 Iron deficiency anemia, unspecified: Secondary | ICD-10-CM | POA: Diagnosis not present

## 2021-08-08 DIAGNOSIS — I079 Rheumatic tricuspid valve disease, unspecified: Secondary | ICD-10-CM | POA: Diagnosis not present

## 2021-08-08 HISTORY — PX: TEE WITHOUT CARDIOVERSION: SHX5443

## 2021-08-08 LAB — CBC
HCT: 23.5 % — ABNORMAL LOW (ref 36.0–46.0)
Hemoglobin: 7.5 g/dL — ABNORMAL LOW (ref 12.0–15.0)
MCH: 23.1 pg — ABNORMAL LOW (ref 26.0–34.0)
MCHC: 31.9 g/dL (ref 30.0–36.0)
MCV: 72.5 fL — ABNORMAL LOW (ref 80.0–100.0)
Platelets: 322 10*3/uL (ref 150–400)
RBC: 3.24 MIL/uL — ABNORMAL LOW (ref 3.87–5.11)
RDW: 20.6 % — ABNORMAL HIGH (ref 11.5–15.5)
WBC: 13.2 10*3/uL — ABNORMAL HIGH (ref 4.0–10.5)
nRBC: 0 % (ref 0.0–0.2)

## 2021-08-08 LAB — BASIC METABOLIC PANEL
Anion gap: 6 (ref 5–15)
BUN: 13 mg/dL (ref 6–20)
CO2: 23 mmol/L (ref 22–32)
Calcium: 7.8 mg/dL — ABNORMAL LOW (ref 8.9–10.3)
Chloride: 103 mmol/L (ref 98–111)
Creatinine, Ser: 0.54 mg/dL (ref 0.44–1.00)
GFR, Estimated: >60 mL/min (ref >60)
Glucose, Bld: 130 mg/dL — ABNORMAL HIGH (ref 70–99)
Potassium: 4.3 mmol/L (ref 3.5–5.1)
Sodium: 132 mmol/L — ABNORMAL LOW (ref 135–145)

## 2021-08-08 LAB — CULTURE, BLOOD (ROUTINE X 2): Special Requests: ADEQUATE

## 2021-08-08 LAB — MAGNESIUM: Magnesium: 2.3 mg/dL (ref 1.7–2.4)

## 2021-08-08 LAB — VANCOMYCIN, TROUGH: Vancomycin Tr: 14 ug/mL — ABNORMAL LOW (ref 15–20)

## 2021-08-08 SURGERY — ECHOCARDIOGRAM, TRANSESOPHAGEAL
Anesthesia: General

## 2021-08-08 MED ORDER — SODIUM CHLORIDE 0.9 % IV SOLN: INTRAVENOUS | Status: DC

## 2021-08-08 MED ORDER — PROPOFOL 500 MG/50ML IV EMUL
INTRAVENOUS | Status: DC | PRN
Start: 2021-08-08 — End: 2021-08-08
  Administered 2021-08-08: 150 ug/kg/min via INTRAVENOUS

## 2021-08-08 MED ORDER — FENTANYL CITRATE (PF) 100 MCG/2ML IJ SOLN
INTRAMUSCULAR | Status: DC | PRN
  Administered 2021-08-08 (×2): 25 ug via INTRAVENOUS

## 2021-08-08 MED ORDER — FENTANYL CITRATE (PF) 100 MCG/2ML IJ SOLN
INTRAMUSCULAR | Status: AC
  Filled 2021-08-08: qty 2

## 2021-08-08 MED ORDER — KETOROLAC TROMETHAMINE 15 MG/ML IJ SOLN
15.0000 mg | Freq: Three times a day (TID) | INTRAMUSCULAR | Status: DC | PRN
  Administered 2021-08-08 – 2021-08-13 (×12): 15 mg via INTRAVENOUS
  Filled 2021-08-08 (×12): qty 1

## 2021-08-08 MED ORDER — LIDOCAINE 2% (20 MG/ML) 5 ML SYRINGE
INTRAMUSCULAR | Status: DC | PRN
  Administered 2021-08-08: 100 mg via INTRAVENOUS

## 2021-08-08 MED ORDER — GUAIFENESIN-DM 100-10 MG/5ML PO SYRP
5.0000 mL | ORAL_SOLUTION | ORAL | Status: DC | PRN
  Administered 2021-08-08 – 2021-08-17 (×14): 5 mL via ORAL
  Filled 2021-08-08 (×5): qty 5
  Filled 2021-08-08: qty 10
  Filled 2021-08-08 (×6): qty 5
  Filled 2021-08-08: qty 10

## 2021-08-08 MED ORDER — VANCOMYCIN HCL 1500 MG/300ML IV SOLN
750.0000 mg | Freq: Three times a day (TID) | INTRAVENOUS | Status: DC
  Administered 2021-08-08: 750 mg via INTRAVENOUS
  Filled 2021-08-08 (×3): qty 150

## 2021-08-08 MED ORDER — HYDROMORPHONE HCL 1 MG/ML IJ SOLN
1.0000 mg | INTRAMUSCULAR | Status: DC | PRN
  Administered 2021-08-08 – 2021-08-09 (×4): 1 mg via INTRAVENOUS
  Filled 2021-08-08 (×4): qty 1

## 2021-08-08 MED ORDER — PROPOFOL 10 MG/ML IV BOLUS
INTRAVENOUS | Status: DC | PRN
  Administered 2021-08-08 (×3): 20 mg via INTRAVENOUS

## 2021-08-08 NOTE — Progress Notes (Addendum)
Pharmacy Antibiotic Note ? ?Latoya [name] is a 36 y.o. female admitted on 08/02/2021 with MRSA bacteremia- possible septic pulmonary emboli/endocarditis. Planned TEE today. Pharmacy has been consulted for Vancomycin dosing. Scr is at patient baseline around 0.5 to 0.6 with estimated CrCl > 100 ml/min.  ? ?Vancomycin levels collected yesterday/today show a peak of 38 and a trough of 14, calculated AUC 697 which is supratherapeutic.  ? ? ? ?Plan: ?- Modify Vancomycin to 750 mg every 8 hours - Predicted AUC 522 with Cmin 11.5 ?- Will continue to follow renal function, culture results, LOT, and antibiotic plans ?- F/U TEE results today   ? ?Height: 5\' 6"  (167.6 cm) ?Weight: 82.2 kg (181 lb 3.5 oz) ?IBW/kg (Calculated) : 59.3 ? ?Temp (24hrs), Avg:99.6 ?F (37.6 ?C), Min:99 ?F (37.2 ?C), Max:100.3 ?F (37.9 ?C) ? ?Recent Labs  ?Lab 08/02/21 ?1442 08/02/21 ?1710 08/03/21 ?08/05/21 08/04/21 ?08/06/21 08/04/21 ?2137 08/05/21 ?0425 08/06/21 ?10/06/21 08/07/21 ?0354 08/07/21 ?2226 08/08/21 ?10/08/21 08/08/21 ?10/08/21  ?WBC 31.7*  --    < > 22.0*  --  17.4* 17.6* 15.5*  --   --  13.2*  ?CREATININE 0.97  --    < > 0.65  --  0.63 0.57 0.53  --   --  0.54  ?LATICACIDVEN 2.2* 1.8  --   --   --   --   --   --   --   --   --   ?VANCOTROUGH  --   --   --   --   --  9*  --   --   --  14*  --   ?VANCOPEAK  --   --   --   --  21*  --   --   --  38  --   --   ? < > = values in this interval not displayed.  ? ?  ?Estimated Creatinine Clearance: 107.2 mL/min (by C-G formula based on SCr of 0.54 mg/dL).   ? ?Allergies  ?Allergen Reactions  ? Naltrexone Anxiety  ?  Severe anxiety, restlessness, vomiting  ? ? ?Antimicrobials this admission: ?Cefepime 3/29 >> 3/30 ?Flagyl 3/29 >> 3/30 ?Vancomycin 3/29 >> ? ?Dose adjustments this admission: ?4/3-4/4 VP 38 VT 14- AUC 697 >>Adj to 750 q 8 hours  ? ?Microbiology results: ?3/29 Influenza/COVID>> neg ?3/29 MRSA PCR >> positive ?3/29 BCx >> GPC in 4/4 bottles (BCID MRSA) ?3/31 BCX: MRSA - only 1 set drawn  ?4/2 BCx: NGTD   ? ?Thank you for allowing pharmacy to be a part of this patient?s care. ? ?4/31, PharmD, BCPS, BCIDP ?Infectious Diseases Clinical Pharmacist ?Phone: [phone] ?08/08/2021 9:15 AM  ? ? ?

## 2021-08-08 NOTE — Transfer of Care (Signed)
Immediate Anesthesia Transfer of Care Note ? ?Patient: Latoya [name] ? ?Procedure(s) Performed: TRANSESOPHAGEAL ECHOCARDIOGRAM (TEE) ? ?Patient Location: Endoscopy Unit ? ?Anesthesia Type:MAC ? ?Level of Consciousness: awake and drowsy ? ?Airway & Oxygen Therapy: Patient Spontanous Breathing and Patient connected to nasal cannula oxygen ? ?Post-op Assessment: Report given to RN and Post -op Vital signs reviewed and stable ? ?Post vital signs: Reviewed and stable ? ?Last Vitals:  ?Vitals Value Taken Time  ?BP 115/76 08/08/21 1232  ?Temp    ?Pulse 106 08/08/21 1232  ?Resp 38 08/08/21 1232  ?SpO2 94 % 08/08/21 1232  ?Vitals shown include unvalidated device data. ? ?Last Pain:  ?Vitals:  ? 08/08/21 1111  ?TempSrc: Temporal  ?PainSc: 10-Worst pain ever  ?   ? ?Patients Stated Pain Goal: 3 (08/08/21 1275) ? ?Complications: No notable events documented. ?

## 2021-08-08 NOTE — Progress Notes (Signed)
OT Cancellation Note ? ?Patient Details ?Name: Latoya [name] ?MRN: [id number] ?DOB: 1985-07-22 ? ? ?Cancelled Treatment:    Reason Eval/Treat Not Completed: Patient at procedure or test/ unavailable ?Patient declining to participate at this time secondary to current pain levels. Nurse in room providing medication at this time. Patient pending TEE this AM. OT to continue to follow and check back as schedule will allow.  ?Karielle Davidow OTR/L, MS ?Acute Rehabilitation Department ?Office# [phone] ?Pager# [phone] ? ?08/08/2021, 9:21 AM ?

## 2021-08-08 NOTE — Anesthesia Preprocedure Evaluation (Signed)
Anesthesia Evaluation  ?Patient identified by MRN, date of birth, ID band ?Patient awake ? ? ? ?Reviewed: ?Allergy & Precautions, NPO status , Patient's Chart, lab work & pertinent test results ? ?Airway ?Mallampati: II ? ?TM Distance: >3 FB ? ? ? ? Dental ?  ?Pulmonary ?Current Smoker and Patient abstained from smoking.,  ?  ?breath sounds clear to auscultation ? ? ? ? ? ? Cardiovascular ?negative cardio ROS ? ? ?Rhythm:Regular Rate:Normal ? ? ?  ?Neuro/Psych ?  ? GI/Hepatic ?Neg liver ROS,   ?Endo/Other  ? ? Renal/GU ?negative Renal ROS  ? ?  ?Musculoskeletal ? ?(+) Arthritis ,  ? Abdominal ?  ?Peds ? Hematology ?  ?Anesthesia Other Findings ? ? Reproductive/Obstetrics ? ?  ? ? ? ? ? ? ? ? ? ? ? ? ? ?  ?  ? ? ? ? ? ? ? ? ?Anesthesia Physical ?Anesthesia Plan ? ?ASA: 3 ? ?Anesthesia Plan: General  ? ?Post-op Pain Management:   ? ?Induction: Intravenous ? ?PONV Risk Score and Plan: 2 and Ondansetron, Dexamethasone and Midazolam ? ?Airway Management Planned: Nasal Cannula and Simple Face Mask ? ?Additional Equipment:  ? ?Intra-op Plan:  ? ?Post-operative Plan:  ? ?Informed Consent: I have reviewed the patients History and Physical, chart, labs and discussed the procedure including the risks, benefits and alternatives for the proposed anesthesia with the patient or authorized representative who has indicated his/her understanding and acceptance.  ? ? ? ? ? ?Plan Discussed with: CRNA and Anesthesiologist ? ?Anesthesia Plan Comments:   ? ? ? ? ? ? ?Anesthesia Quick Evaluation ? ?

## 2021-08-08 NOTE — Progress Notes (Signed)
?PROGRESS NOTE ? ? ? ?Latoya [name]  IWP:[id number] DOB: August 02, 1985 DOA: 08/02/2021 ?PCP: Pcp, No  ? ? ?Brief Narrative:  ? ?Latoya [name] is a 36 y.o. female with past medical history of IV drug abuse, heroin abuse, history of MSSA tricuspid valve endocarditis in 2020 treated with Ancef and AngioJet at [facility], history of MRSA discitis and osteomyelitis of the lumbar spine in 2021 presented to hospital with severe low back pain for couple of weeks and subjective fevers.  In the ED patient was noted to have hyponatremia, hypokalemia, significant leukocytosis and neutrophilia.  Chest x-ray showed new patchy opacities.  UA showed white cells.  COVID and influenza was negative.  MRSA PCR was positive.  Patient was then admitted hospital for sepsis with hypotension with multifocal pneumonia.  This time, lumbar spine MRI with no signs of infection. Blood cultures 4/4 bottles positive for staph aureus/ MRSA.  EKG showed sinus tachycardia. Patient was placed on antibiotic therapy. Transthoracic echocardiogram with possible vegetation in the tricuspid valve. CT chest with septic pulmonary embolisms.  Patient continued to have fever during hospitalization.  Currently scheduled for TEE on 08/08/2021. ?  ?Assessment and Plan: ? ?Principal Problem: ?  Endocarditis of tricuspid valve ?Active Problems: ?  Hyponatremia ?  History of bacterial endocarditis ?  Iron deficiency anemia ?  IV drug abuse (HCC) ?  Opioid use disorder ?  ?Endocarditis of tricuspid valve ?Previous history of tricuspid valve bacterial endocarditis status post Ancef and angio VAC treatment at [facility].  Recurrent endocarditis at this time.  MRSA from culture 08/02/21.  2D echocardiogram with a small oscillating density in the tricuspid valve.  Plan for TEE on 08/08/2021.  CT chest with extensive bilateral cavitary lung nodules compatible with septic emboli with small pleural effusion and atelectasis.  Patient continues to have a spike  of fever or leukocytosis.  On IV vancomycin. TEE scheduled for 4/4.  ? ?Hyponatremia ?Mild.  Latest sodium of 132, continue to monitor.  On D5 and NS at 50 ml per hr.  Could change to normal saline after TEE performed. ? ?Iron deficiency anemia ?Iron panel with serum iron of 22, transferrin saturation 7, ferritin 51 and TIBC 312.  On oral iron. ?Plan for IV iron when sepsis resolves.  ? ?IV drug abuse (HCC) ?On Dilaudid for severe pain and Toradol for mild to moderate pain.  As needed Ativan for anxiety ?Continue nicotine patch.  ? ?Opioid use disorder ?Continue pain control with hydromorphone.  ? ? DVT prophylaxis: Place and maintain sequential compression device Start: 08/07/21 1350 ?enoxaparin (LOVENOX) injection 40 mg Start: 08/02/21 2200 ? ? ?Code Status:   ?  Code Status: Full Code ? ?Disposition: Home, unknown time  ? ?Status is: Inpatient ? ?Remains inpatient appropriate because: MRSA infection, endocarditis, IV antibiotic, plan for TEE, pending clinical improvement ? ? Family Communication:  ?Spoke with the patient at bedside ? ?Consultants:  ?Infectious disease ? ?Procedures:  ?2 DEchocardiogram ? ?Antimicrobials:  ?Vancomycin IV ? ?Anti-infectives (From admission, onward)  ? ? Start     Dose/Rate Route Frequency Ordered Stop  ? 08/08/21 1700  vancomycin (VANCOREADY) IVPB 1500 mg/300 mL       ? 750 mg ?75 mL/hr over 120 Minutes Intravenous Every 8 hours 08/08/21 0920    ? 08/05/21 1800  vancomycin (VANCOREADY) IVPB 1500 mg/300 mL  Status:  Discontinued       ? 1,500 mg ?150 mL/hr over 120 Minutes Intravenous Every 12 hours 08/05/21 0618 08/08/21  0920  ? 08/04/21 1700  vancomycin (VANCOREADY) IVPB 1250 mg/250 mL  Status:  Discontinued       ? 1,250 mg ?166.7 mL/hr over 90 Minutes Intravenous Every 12 hours 08/04/21 1613 08/05/21 0618  ? 08/03/21 1700  vancomycin (VANCOCIN) IVPB 1000 mg/200 mL premix  Status:  Discontinued       ? 1,000 mg ?200 mL/hr over 60 Minutes Intravenous Every 12 hours 08/03/21 1039  08/03/21 1042  ? 08/03/21 1700  Vancomycin (VANCOCIN) 1,250 mg in sodium chloride 0.9 % 250 mL IVPB  Status:  Discontinued       ? 1,250 mg ?166.7 mL/hr over 90 Minutes Intravenous Every 12 hours 08/03/21 1042 08/03/21 1111  ? 08/03/21 1700  Vancomycin (VANCOCIN) 1,250 mg in sodium chloride 0.9 % 250 mL IVPB  Status:  Discontinued       ? 1,250 mg ?166.7 mL/hr over 90 Minutes Intravenous Every 12 hours 08/03/21 1112 08/03/21 1124  ? 08/03/21 1700  Vancomycin (VANCOCIN) 1,250 mg in sodium chloride 0.9 % 250 mL IVPB  Status:  Discontinued       ? 1,250 mg ?175 mL/hr over 90 Minutes Intravenous Every 12 hours 08/03/21 1124 08/04/21 1614  ? 08/03/21 0600  vancomycin (VANCOCIN) IVPB 750 mg/150 ml premix  Status:  Discontinued       ? 750 mg ?150 mL/hr over 60 Minutes Intravenous Every 12 hours 08/02/21 2143 08/03/21 0020  ? 08/03/21 0600  vancomycin (VANCOREADY) IVPB 750 mg/150 mL  Status:  Discontinued       ? 750 mg ?150 mL/hr over 60 Minutes Intravenous Every 12 hours 08/03/21 0022 08/03/21 1039  ? 08/03/21 0200  ceFEPIme (MAXIPIME) 2 g in sodium chloride 0.9 % 100 mL IVPB  Status:  Discontinued       ? 2 g ?200 mL/hr over 30 Minutes Intravenous Every 8 hours 08/02/21 2138 08/03/21 0946  ? 08/02/21 2200  metroNIDAZOLE (FLAGYL) IVPB 500 mg  Status:  Discontinued       ? 500 mg ?100 mL/hr over 60 Minutes Intravenous Every 12 hours 08/02/21 2135 08/03/21 0946  ? 08/02/21 1545  vancomycin (VANCOCIN) IVPB 1000 mg/200 mL premix       ? 1,000 mg ?200 mL/hr over 60 Minutes Intravenous  Once 08/02/21 1537 08/02/21 2138  ? 08/02/21 1545  ceFEPIme (MAXIPIME) 2 g in sodium chloride 0.9 % 100 mL IVPB       ? 2 g ?200 mL/hr over 30 Minutes Intravenous  Once 08/02/21 1537 08/02/21 1933  ? ?  ? ?Subjective: ?Today, patient was seen and examined at bedside.  Complains of pain all over the body with some difficulty sleeping due to pain of the IV.  Complains of mild shortness of breath.   ? ?Objective: ?Vitals:  ? 08/07/21 1125 08/07/21  1334 08/07/21 2037 08/08/21 0557  ?BP: 138/87  113/82 108/81  ?Pulse: (!) 107  (!) 103 (!) 107  ?Resp: 19  20 20   ?Temp: 100.3 ?F (37.9 ?C) 99 ?F (37.2 ?C) 99.3 ?F (37.4 ?C) 99.4 ?F (37.4 ?C)  ?TempSrc: Oral Oral Oral Oral  ?SpO2: 97%  97% 96%  ?Weight:      ?Height:      ? ? ?Intake/Output Summary (Last 24 hours) at 08/08/2021 1005 ?Last data filed at 08/08/2021 0600 ?Gross per 24 hour  ?Intake 1020 ml  ?Output 3050 ml  ?Net -2030 ml  ? ? ?Filed Weights  ? 08/02/21 1412 08/02/21 2318 08/03/21 1036  ?Weight: 68 kg 83  kg 82.2 kg  ? ? ?Physical Examination: ? ?General:  Average built, not in obvious distress, communicative, ?HENT:   No scleral pallor or icterus noted. Oral mucosa is moist.  ?Chest:  Diminished breath sounds bilaterally, coarse breath sounds noted, mildly tachypneic ?CVS: S1 &S2 heard. No murmur.  Regular rate and rhythm.  Tachycardic. ?Abdomen: Soft, nontender, nondistended.  Bowel sounds are heard.   ?Extremities: No cyanosis, clubbing or edema.  Peripheral pulses are palpable. ?Psych: Alert, awake and oriented, normal mood ?CNS:  No cranial nerve deficits.  Power equal in all extremities.   ?Skin: Warm and dry.  No rashes noted.  Scabs over the lower extremities. ? ?Data Reviewed:  ? ?CBC: ?Recent Labs  ?Lab 08/02/21 ?1442 08/03/21 ?[id number] 08/04/21 ?[id number] 08/05/21 ?0425 08/06/21 ?[id number] 08/07/21 ?0354 08/08/21 ?0434  ?WBC 31.7*   < > 22.0* 17.4* 17.6* 15.5* 13.2*  ?NEUTROABS 27.6*  --   --  13.6*  --   --   --   ?HGB 10.4*   < > 8.2* 8.5* 7.7* 7.4* 7.5*  ?HCT 30.8*   < > 26.4* 25.9* 23.7* 23.5* 23.5*  ?MCV 69.2*   < > 72.7* 70.2* 71.8* 72.8* 72.5*  ?PLT 199   < > 211 231 265 297 322  ? < > = values in this interval not displayed.  ? ? ? ?Basic Metabolic Panel: ?Recent Labs  ?Lab 08/02/21 ?1442 08/03/21 ?[id number] 08/04/21 ?[id number] 08/05/21 ?0425 08/06/21 ?[id number] 08/07/21 ?0354 08/08/21 ?0434  ?NA 127*   < > 130* 133* 135 135 132*  ?K 2.4*   < > 4.2 4.2 4.1 4.4 4.3  ?CL 93*   < > 105 105 107 106 103  ?CO2 21*   < > 20*  22 22 24 23   ?GLUCOSE 108*   < > 95 113* 112* 109* 130*  ?BUN 25*   < > 13 13 10 17 13   ?CREATININE 0.97   < > 0.65 0.63 0.57 0.53 0.54  ?CALCIUM 7.8*   < > 7.5* 7.6* 7.4* 7.7* 7.8*  ?MG 2.1  --  2.

## 2021-08-08 NOTE — Progress Notes (Signed)
?   08/08/21 1629  ?Assess: MEWS Score  ?Temp 98.7 ?F (37.1 ?C)  ?BP 114/77  ?Pulse Rate (!) 117  ?Resp (!) 28  ?SpO2 96 %  ?O2 Device Nasal Cannula  ?Assess: MEWS Score  ?MEWS Temp 0  ?MEWS Systolic 0  ?MEWS Pulse 2  ?MEWS RR 2  ?MEWS LOC 0  ?MEWS Score 4  ?MEWS Score Color Red  ?Assess: if the MEWS score is Yellow or Red  ?Were vital signs taken at a resting state? Yes  ?Focused Assessment No change from prior assessment  ?Does the patient meet 2 or more of the SIRS criteria? Yes  ?Does the patient have a confirmed or suspected source of infection? Yes  ?Provider and Rapid Response Notified? No  ?MEWS guidelines implemented *See Row Information* No, previously red, continue vital signs every 4 hours  ?Treat  ?MEWS Interventions Administered prn meds/treatments  ?Escalate  ?MEWS: Escalate Red: discuss with charge nurse/RN and provider, consider discussing with RRT  ?Notify: Charge Nurse/RN  ?Name of Charge Nurse/RN Notified Delford Field, RN  ?Date Charge Nurse/RN Notified 08/08/21  ?Time Charge Nurse/RN Notified 1646  ?Assess: SIRS CRITERIA  ?SIRS Temperature  0  ?SIRS Pulse 1  ?SIRS Respirations  1  ?SIRS WBC 0  ?SIRS Score Sum  2  ? ? ?

## 2021-08-08 NOTE — Anesthesia Procedure Notes (Signed)
Procedure Name: [place] ?Date/Time: 08/08/2021 12:11 PM ?Performed by: Dorann Lodge, CRNA ?Pre-anesthesia Checklist: Patient identified, Emergency Drugs available, Suction available and Patient being monitored ?Patient Re-evaluated:Patient Re-evaluated prior to induction ?Oxygen Delivery Method: Nasal cannula ?Induction Type: IV induction ?Airway Equipment and Method: Bite block ? ? ? ? ?

## 2021-08-08 NOTE — Progress Notes (Signed)
?  Echocardiogram ?Echocardiogram Transesophageal has been performed. ? ?Latoya [name] ?08/08/2021, 12:38 PM ?

## 2021-08-08 NOTE — Progress Notes (Signed)
PT Cancellation Note ? ?Patient Details ?Name: Latoya [name] ?MRN: [id number] ?DOB: 02-27-1986 ? ? ?Cancelled Treatment:     Pt at [facility] most of day for TEE.  Will attempt to see tomorrow as schedule permits. ? ? ?Felecia Shelling  PTA ?Acute  Rehabilitation Services ?Pager      [phone] ?Office      [phone] ? ? ?

## 2021-08-08 NOTE — Progress Notes (Addendum)
? ?[facility] Infectious Diseases Follow Up Note ? ?Patient Identification: ?Patient Name: Latoya [name] MRN: [id number] Admit Date: 08/02/2021  1:58 PM ?Age: 36 y.o.Today's Date: 08/08/2021 ? ? ?Reason for Visit: TV endocarditis  ? ?Principal Problem: ?  Endocarditis of tricuspid valve ?Active Problems: ?  Iron deficiency anemia ?  Opioid use disorder ?  IV drug abuse (HCC) ?  History of bacterial endocarditis ?  Hyponatremia ? ?Antibiotics:  ?Vancomycin 3/29-c ?Cefepime 3/29-3/30 ?Metronidazole 3/29-3/30 ?  ?Lines/Hardware: ?  ?Interval Events: afebrile. TEE findings noted  ? ?Assessment ?# MRSA Native TV endocarditis complicated with septic pulmonary emboli with severe TVR ?- 3/31 blood cx staph aureus  ?- 4/2 blood cx 1 bottle staph epidermidis  ? ?# Staph epidermidis in 1 bottle - likely a contaminant  ?# Leukocytosis - downtrending  ?# Active IVDU- Hep B surface ag negative, HCV RNA negative, HIV negative  ?# Smoker ?  ?Others ?# MSSA TV endocarditis in 202- treated with cefazolin and angiovac at [facility] ?# MRSA L4-L5 facet joint septic arthritis in 07/2019( IV Vancomycin >>Linezolid  on discharge? Compliance)  ?-Opioid use  ? ? ?Recommendations ?Continue vancomycin,  pharmacy to dose ?FU repeat blood cultures for clearance ?CT surgery consult given large size of TV vegetation and severe TVR ?Monitor CBC, BMP and Vancomycin trough  ?Management of opioid use and withdrawal per primary team  ?Following ? ?Rest of the management as per the primary team. ?Thank you for the consult. Please page with pertinent questions or concerns. ? ?______________________________________________________________________ ?Subjective ?Patient not seen , in TEE  ?\ ?Vitals ?BP 124/82 (BP Location: Left Arm)   Pulse (!) 107   Temp 99.1 ?F (37.3 ?C) (Oral)   Resp (!) 28   Ht 5\' 6"  (1.676 m)   Wt 82.2 kg   SpO2 96%   BMI 29.25 kg/m?  ? ?Pertinent Microbiology ?Results for orders placed  or performed during the hospital encounter of 08/02/21  ?Blood culture (routine x 2)     Status: Abnormal  ? Collection Time: 08/02/21  2:40 PM  ? Specimen: BLOOD  ?Result Value Ref Range Status  ? Specimen Description   Final  ?  BLOOD LEFT ANTECUBITAL ?Performed at [facility], [address], [place], [place] ?  ? Special Requests   Final  ?  BOTTLES DRAWN AEROBIC AND ANAEROBIC Blood Culture results may not be optimal due to an excessive volume of blood received in culture bottles ?Performed at [facility], [address], [place], [place] ?  ? Culture  Setup Time   Final  ?  GRAM POSITIVE COCCI IN CLUSTERS ?IN BOTH AEROBIC AND ANAEROBIC BOTTLES ?CRITICAL RESULT CALLED TO, READ BACK BY AND VERIFIED WITH: PHARMD TERRY GREEN 08/03/21 @ 9:31 BY DRT ?Performed at [facility], [address] [place], [place] ?  ? Culture METHICILLIN RESISTANT STAPHYLOCOCCUS AUREUS (A)  Final  ? Report Status 08/05/2021 FINAL  Final  ? Organism ID, Bacteria METHICILLIN RESISTANT STAPHYLOCOCCUS AUREUS  Final  ?    Susceptibility  ? Methicillin resistant staphylococcus aureus - MIC*  ?  CIPROFLOXACIN >=8 RESISTANT Resistant   ?  ERYTHROMYCIN >=8 RESISTANT Resistant   ?  GENTAMICIN <=0.5 SENSITIVE Sensitive   ?  OXACILLIN >=4 RESISTANT Resistant   ?  TETRACYCLINE >=16 RESISTANT Resistant   ?  VANCOMYCIN 1 SENSITIVE Sensitive   ?  TRIMETH/SULFA >=320 RESISTANT Resistant   ?  CLINDAMYCIN <=0.25 SENSITIVE Sensitive   ?  RIFAMPIN <=0.5 SENSITIVE Sensitive   ?  Inducible Clindamycin NEGATIVE Sensitive   ?  * METHICILLIN RESISTANT STAPHYLOCOCCUS AUREUS  ?Blood Culture ID Panel (Reflexed)     Status: Abnormal  ? Collection Time: 08/02/21  2:40 PM  ?Result Value Ref Range Status  ? Enterococcus faecalis NOT DETECTED NOT DETECTED Final  ? Enterococcus Faecium NOT DETECTED NOT DETECTED Final  ? Listeria monocytogenes NOT DETECTED NOT DETECTED Final  ? Staphylococcus  species DETECTED (A) NOT DETECTED Final  ?  Comment: CRITICAL RESULT CALLED TO, READ BACK BY AND VERIFIED WITH: ?PHARMD TERRY GREEN 08/03/21 @ 9:32 BY DRT ?  ? Staphylococcus aureus (BCID) DETECTED (A) NOT DETECTED Final  ?  Comment: Methicillin (oxacillin)-resistant Staphylococcus aureus (MRSA). MRSA is predictably resistant to beta-lactam antibiotics (except ceftaroline). Preferred therapy is vancomycin unless clinically contraindicated. Patient requires contact precautions if  ?hospitalized. ?CRITICAL RESULT CALLED TO, READ BACK BY AND VERIFIED WITH: ?PHARMD TERRY GREEN 08/03/21 @ 9:32 BY DRT ?  ? Staphylococcus epidermidis NOT DETECTED NOT DETECTED Final  ? Staphylococcus lugdunensis NOT DETECTED NOT DETECTED Final  ? Streptococcus species NOT DETECTED NOT DETECTED Final  ? Streptococcus agalactiae NOT DETECTED NOT DETECTED Final  ? Streptococcus pneumoniae NOT DETECTED NOT DETECTED Final  ? Streptococcus pyogenes NOT DETECTED NOT DETECTED Final  ? A.calcoaceticus-baumannii NOT DETECTED NOT DETECTED Final  ? Bacteroides fragilis NOT DETECTED NOT DETECTED Final  ? Enterobacterales NOT DETECTED NOT DETECTED Final  ? Enterobacter cloacae complex NOT DETECTED NOT DETECTED Final  ? Escherichia coli NOT DETECTED NOT DETECTED Final  ? Klebsiella aerogenes NOT DETECTED NOT DETECTED Final  ? Klebsiella oxytoca NOT DETECTED NOT DETECTED Final  ? Klebsiella pneumoniae NOT DETECTED NOT DETECTED Final  ? Proteus species NOT DETECTED NOT DETECTED Final  ? Salmonella species NOT DETECTED NOT DETECTED Final  ? Serratia marcescens NOT DETECTED NOT DETECTED Final  ? Haemophilus influenzae NOT DETECTED NOT DETECTED Final  ? Neisseria meningitidis NOT DETECTED NOT DETECTED Final  ? Pseudomonas aeruginosa NOT DETECTED NOT DETECTED Final  ? Stenotrophomonas maltophilia NOT DETECTED NOT DETECTED Final  ? Candida albicans NOT DETECTED NOT DETECTED Final  ? Candida auris NOT DETECTED NOT DETECTED Final  ? Candida glabrata NOT DETECTED NOT  DETECTED Final  ? Candida krusei NOT DETECTED NOT DETECTED Final  ? Candida parapsilosis NOT DETECTED NOT DETECTED Final  ? Candida tropicalis NOT DETECTED NOT DETECTED Final  ? Cryptococcus neoformans/gattii NOT DETECTED NOT DETECTED Final  ? Meth resistant mecA/C and MREJ DETECTED (A) NOT DETECTED Final  ?  Comment: CRITICAL RESULT CALLED TO, READ BACK BY AND VERIFIED WITH: ?PHARMD TERRY GREEN 08/03/21 @ 9:32 BY DRT ?Performed at [facility], [address] [place], [place] [address] ?  ?Blood culture (routine x 2)     Status: Abnormal  ? Collection Time: 08/02/21  5:09 PM  ? Specimen: BLOOD  ?Result Value Ref Range Status  ? Specimen Description   Final  ?  BLOOD BLOOD RIGHT ARM ?Performed at [facility], [address] [place], [place] [address] ?  ? Special Requests   Final  ?  BOTTLES DRAWN AEROBIC AND ANAEROBIC Blood Culture adequate volume ?Performed at [facility], [address] [place], [place] [address] ?  ? Culture  Setup Time   Final  ?  GRAM POSITIVE COCCI IN CLUSTERS ?IN BOTH AEROBIC AND ANAEROBIC BOTTLES ?  ? Culture (A)  Final  ?  STAPHYLOCOCCUS AUREUS ?SUSCEPTIBILITIES PERFORMED ON PREVIOUS CULTURE WITHIN  THE LAST 5 DAYS. ?Performed at [facility], [address], [place], [place] [address] ?  ? Report Status 08/05/2021 FINAL  Final  ?Resp Panel by RT-PCR (Flu A&B, Covid) Nasopharyngeal Swab     Status: None  ? Collection Time: 08/02/21  9:33 PM  ? Specimen: Nasopharyngeal Swab; Nasopharyngeal(NP) swabs in vial transport medium  ?Result Value Ref Range Status  ? SARS Coronavirus 2 by RT PCR NEGATIVE NEGATIVE Final  ?  Comment: (NOTE) ?SARS-CoV-2 target nucleic acids are NOT DETECTED. ? ?The SARS-CoV-2 RNA is generally detectable in upper respiratory ?specimens during the acute phase of infection. The lowest ?concentration of SARS-CoV-2 viral copies this assay can detect is ?138 copies/mL. A negative result does not preclude  SARS-Cov-2 ?infection and should not be used as the sole basis for treatment or ?other patient management decisions. A negative result may occur with  ?improper specimen collection/handling, submission of specimen other ?than nas

## 2021-08-08 NOTE — Anesthesia Postprocedure Evaluation (Signed)
Anesthesia Post Note ? ?Patient: Latoya [name] ? ?Procedure(s) Performed: TRANSESOPHAGEAL ECHOCARDIOGRAM (TEE) ? ?  ? ?Patient location during evaluation: PACU ?Anesthesia Type: General ?Level of consciousness: awake ?Pain management: pain level controlled ?Vital Signs Assessment: post-procedure vital signs reviewed and stable ?Respiratory status: spontaneous breathing ?Cardiovascular status: stable ?Postop Assessment: no apparent nausea or vomiting ?Anesthetic complications: no ? ? ?No notable events documented. ? ?Last Vitals:  ?Vitals:  ? 08/08/21 1233 08/08/21 1250  ?BP: 115/76 125/81  ?Pulse: (!) 106 (!) 111  ?Resp: (!) 38 (!) 34  ?Temp:    ?SpO2: 94% 94%  ?  ?Last Pain:  ?Vitals:  ? 08/08/21 1250  ?TempSrc:   ?PainSc: 0-No pain  ? ? ?  ?  ?  ?  ?  ?  ? ?Briyah Wheelwright ? ? ? ? ?

## 2021-08-08 NOTE — Progress Notes (Signed)
? ? ?  [facility] has been requested to perform a transesophageal echocardiogram on Latoya [name] for further evaluation of tricuspid endocarditis/bacteremia.  ? ?Patient was very lethargic while I explained the procedure. Patient would not open her eyes and appeared to doze off several times during the discussion. Patient is aware why she is in the hospital, but was unable to repeat back to me why we were doing the procedure or what a TEE was. Asked patient for permission to contact her mother to discuss procedure and patient agreed. Called Clydie Braun Macmullen to discuss procedure.  ? ?After careful review of history and examination, the risks and benefits of transesophageal echocardiogram have been explained including risks of esophageal damage, perforation (1:10,000 risk), bleeding, pharyngeal hematoma as well as other potential complications associated with conscious sedation including aspiration, arrhythmia, respiratory failure and death. Alternatives to treatment were discussed, questions were answered. Clydie Braun Pam consented to the TEE for [name]. Consent was witnessed by Kathrynn Humble  RN.  ? ?Jonita Albee, PA-C ?08/08/2021 7:46 AM  ? ?

## 2021-08-09 DIAGNOSIS — F17210 Nicotine dependence, cigarettes, uncomplicated: Secondary | ICD-10-CM | POA: Diagnosis not present

## 2021-08-09 DIAGNOSIS — Z954 Presence of other heart-valve replacement: Secondary | ICD-10-CM | POA: Diagnosis not present

## 2021-08-09 DIAGNOSIS — I079 Rheumatic tricuspid valve disease, unspecified: Secondary | ICD-10-CM | POA: Diagnosis not present

## 2021-08-09 DIAGNOSIS — I2699 Other pulmonary embolism without acute cor pulmonale: Secondary | ICD-10-CM | POA: Diagnosis not present

## 2021-08-09 LAB — BASIC METABOLIC PANEL
Anion gap: 6 (ref 5–15)
BUN: 19 mg/dL (ref 6–20)
CO2: 23 mmol/L (ref 22–32)
Calcium: 7.8 mg/dL — ABNORMAL LOW (ref 8.9–10.3)
Chloride: 104 mmol/L (ref 98–111)
Creatinine, Ser: 0.62 mg/dL (ref 0.44–1.00)
GFR, Estimated: >60 mL/min (ref >60)
Glucose, Bld: 120 mg/dL — ABNORMAL HIGH (ref 70–99)
Potassium: 4.1 mmol/L (ref 3.5–5.1)
Sodium: 133 mmol/L — ABNORMAL LOW (ref 135–145)

## 2021-08-09 LAB — CULTURE, BLOOD (ROUTINE X 2): Special Requests: ADEQUATE

## 2021-08-09 LAB — CBC
HCT: 25.5 % — ABNORMAL LOW (ref 36.0–46.0)
Hemoglobin: 7.9 g/dL — ABNORMAL LOW (ref 12.0–15.0)
MCH: 22.8 pg — ABNORMAL LOW (ref 26.0–34.0)
MCHC: 31 g/dL (ref 30.0–36.0)
MCV: 73.5 fL — ABNORMAL LOW (ref 80.0–100.0)
Platelets: 378 10*3/uL (ref 150–400)
RBC: 3.47 MIL/uL — ABNORMAL LOW (ref 3.87–5.11)
RDW: 20.4 % — ABNORMAL HIGH (ref 11.5–15.5)
WBC: 13.8 10*3/uL — ABNORMAL HIGH (ref 4.0–10.5)
nRBC: 0 % (ref 0.0–0.2)

## 2021-08-09 LAB — MAGNESIUM: Magnesium: 2.3 mg/dL (ref 1.7–2.4)

## 2021-08-09 MED ORDER — VANCOMYCIN HCL 750 MG/150ML IV SOLN
750.0000 mg | Freq: Three times a day (TID) | INTRAVENOUS | Status: DC
  Administered 2021-08-09 – 2021-08-14 (×17): 750 mg via INTRAVENOUS
  Filled 2021-08-09 (×19): qty 150

## 2021-08-09 MED ORDER — HYDROMORPHONE HCL 1 MG/ML IJ SOLN
1.0000 mg | INTRAMUSCULAR | Status: DC | PRN
  Administered 2021-08-09 – 2021-08-17 (×79): 1 mg via INTRAVENOUS
  Filled 2021-08-09 (×79): qty 1

## 2021-08-09 NOTE — Progress Notes (Signed)
? ?[facility] Infectious Diseases Follow Up Note ? ?Patient Identification: ?Patient Name: Latoya DecampHeather Kunsman MRN: [id number] Admit Date: 08/02/2021  1:58 PM ?Age: 36 y.o.Today's Date: 08/09/2021 ? ? ?Reason for Visit: TV endocarditis  ? ?Principal Problem: ?  Endocarditis of tricuspid valve ?Active Problems: ?  Iron deficiency anemia ?  Opioid use disorder ?  IV drug abuse (HCC) ?  History of bacterial endocarditis ?  Hyponatremia ? ?Antibiotics:  ?Vancomycin 3/29-c ?Cefepime 3/29-3/30 ?Metronidazole 3/29-3/30 ?  ?Lines/Hardware: ?  ?Interval Events: afebrile ? 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 ?# Staph epidermidis in 1 bottle - likely a contaminant  ?# Leukocytosis - stable  ?# Active IVDU- Hep B surface ag negative, HCV RNA negative, HIV negative  ?# Smoker ?  ?Others ?# MSSA TV endocarditis in 202- [facility] with [facility]efazolin [facility] ?# MRSA L4-L5 facet joint septic arthritis in 07/2019( IV Vancomycin >>Linezolid  on discharge? Compliance)  ?-Opioid use  ? ? ?Recommendations ?Continue vancomycin,  pharmacy to dose ?FU repeat blood cultures for clearance. Would regard 4/2 as date of negative cultures if no growth of MRSA ?CT surgery consult is pending given large size of TV vegetation and severe TVR ?Monitor CBC, BMP and Vancomycin trough  ?Management of opioid use and withdrawal per primary team  ?Following ? ?Rest of the management as per the primary team. ?Thank you for the consult. Please page with pertinent questions or concerns. ? ?______________________________________________________________________ ?Subjective ?Patient see and examined ?" Hurts all over" ? ?Vitals ?BP 104/63 (BP Location: Left Arm)   Pulse (!) 104   Temp 98.3 ?F (36.8 ?C) (Axillary)   Resp 20   Ht 5\' 6"  (1.676 m)   Wt 82.2 kg   SpO2 94%    BMI 29.25 kg/m?  ? ?Exam ?Not in acute distress, lying in the bed and appears tired  ?Heart sounds - regular rate and rhythm ?Lung sounds - bilateral equal air entry ?Abdomen - soft and non distended  ?Extremities - chronic appearing excoriation marks in LE ?Neuro- awake, alert, oriented and following commands  ? ?Pertinent Microbiology ?Results for orders placed or performed during the hospital encounter of 08/02/21  ?Blood culture (routine x 2)     Status: Abnormal  ? Collection Time: 08/02/21  2:40 PM  ? Specimen: BLOOD  ?Result Value Ref Range Status  ? Specimen Description   Final  ?  BLOOD LEFT ANTECUBITAL ?Performed at [facility], [address] [place], [place] [address] ?  ? Special Requests   Final  ?  BOTTLES DRAWN AEROBIC AND ANAEROBIC Blood Culture results may not be optimal due to an excessive volume of blood received in culture bottles ?Performed at [facility], [address] [place], [place] [address] ?  ? Culture  Setup Time   Final  ?  GRAM POSITIVE COCCI IN CLUSTERS ?IN BOTH AEROBIC AND ANAEROBIC BOTTLES ?CRITICAL RESULT CALLED TO, READ BACK BY AND VERIFIED WITH: PHARMD TERRY GREEN 08/03/21 @ 9:31 BY DRT ?Performed at [facility], [address] [place], [place] [address] ?  ? Culture METHICILLIN RESISTANT STAPHYLOCOCCUS AUREUS (A)  Final  ? Report Status 08/05/2021 FINAL  Final  ? Organism ID, Bacteria METHICILLIN RESISTANT STAPHYLOCOCCUS AUREUS  Final  ?    Susceptibility  ? Methicillin resistant staphylococcus aureus - MIC*  ?  CIPROFLOXACIN >=8 RESISTANT Resistant   ?  ERYTHROMYCIN >=8 RESISTANT Resistant   ?  GENTAMICIN <=0.5 SENSITIVE Sensitive   ?  OXACILLIN >=4 RESISTANT Resistant   ?  TETRACYCLINE >=16 RESISTANT Resistant   ?  VANCOMYCIN 1 SENSITIVE Sensitive   ?  TRIMETH/SULFA >=320 RESISTANT Resistant   ?  CLINDAMYCIN <=0.25 SENSITIVE Sensitive   ?  RIFAMPIN <=0.5 SENSITIVE Sensitive   ?  Inducible Clindamycin NEGATIVE Sensitive   ?  *  METHICILLIN RESISTANT STAPHYLOCOCCUS AUREUS  ?Blood Culture ID Panel (Reflexed)     Status: Abnormal  ? Collection Time: 08/02/21  2:40 PM  ?Result Value Ref Range Status  ? Enterococcus faecalis NOT DETECTED NOT DETECTED Final  ? Enterococcus Faecium NOT DETECTED NOT DETECTED Final  ? Listeria monocytogenes NOT DETECTED NOT DETECTED Final  ? Staphylococcus species DETECTED (A) NOT DETECTED Final  ?  Comment: CRITICAL RESULT CALLED TO, READ BACK BY AND VERIFIED WITH: ?PHARMD TERRY GREEN 08/03/21 @ 9:32 BY DRT ?  ? Staphylococcus aureus (BCID) DETECTED (A) NOT DETECTED Final  ?  Comment: Methicillin (oxacillin)-resistant Staphylococcus aureus (MRSA). MRSA is predictably resistant to beta-lactam antibiotics (except ceftaroline). Preferred therapy is vancomycin unless clinically contraindicated. Patient requires contact precautions if  ?hospitalized. ?CRITICAL RESULT CALLED TO, READ BACK BY AND VERIFIED WITH: ?PHARMD TERRY GREEN 08/03/21 @ 9:32 BY DRT ?  ? Staphylococcus epidermidis NOT DETECTED NOT DETECTED Final  ? Staphylococcus lugdunensis NOT DETECTED NOT DETECTED Final  ? Streptococcus species NOT DETECTED NOT DETECTED Final  ? Streptococcus agalactiae NOT DETECTED NOT DETECTED Final  ? Streptococcus pneumoniae NOT DETECTED NOT DETECTED Final  ? Streptococcus pyogenes NOT DETECTED NOT DETECTED Final  ? A.calcoaceticus-baumannii NOT DETECTED NOT DETECTED Final  ? Bacteroides fragilis NOT DETECTED NOT DETECTED Final  ? Enterobacterales NOT DETECTED NOT DETECTED Final  ? Enterobacter cloacae complex NOT DETECTED NOT DETECTED Final  ? Escherichia coli NOT DETECTED NOT DETECTED Final  ? Klebsiella aerogenes NOT DETECTED NOT DETECTED Final  ? Klebsiella oxytoca NOT DETECTED NOT DETECTED Final  ? Klebsiella pneumoniae NOT DETECTED NOT DETECTED Final  ? Proteus species NOT DETECTED NOT DETECTED Final  ? Salmonella species NOT DETECTED NOT DETECTED Final  ? Serratia marcescens NOT DETECTED NOT DETECTED Final  ? Haemophilus  influenzae NOT DETECTED NOT DETECTED Final  ? Neisseria meningitidis NOT DETECTED NOT DETECTED Final  ? Pseudomonas aeruginosa NOT DETECTED NOT DETECTED Final  ? Stenotrophomonas maltophilia NOT DETECTED NOT DETECTED Final  ? Candida albicans NOT DETECTED NOT DETECTED Final  ? Candida auris NOT DETECTED NOT DETECTED Final  ? Candida glabrata NOT DETECTED NOT DETECTED Final  ? Candida krusei NOT DETECTED NOT DETECTED Final  ? Candida parapsilosis NOT DETECTED NOT DETECTED Final  ? Candida tropicalis NOT DETECTED NOT DETECTED Final  ? Cryptococcus neoformans/gattii NOT DETECTED NOT DETECTED Final  ? Meth resistant mecA/C and MREJ DETECTED (A) NOT DETECTED Final  ?  Comment: CRITICAL RESULT CALLED TO, READ BACK BY AND VERIFIED WITH: ?PHARMD TERRY GREEN 08/03/21 @ 9:32 BY DRT ?Performed at [facility], [address] [place], [place] [address] ?  ?Blood culture (routine x 2)     Status: Abnormal  ? Collection Time: 08/02/21  5:09 PM  ? Specimen: BLOOD  ?Result Value Ref Range Status  ? Specimen Description   Final  ?  BLOOD BLOOD RIGHT ARM ?Performed at [facility], [address], [place], [place] [address] ?  ? Special Requests   Final  ?  BOTTLES DRAWN AEROBIC AND ANAEROBIC Blood Culture adequate volume ?Performed at [facility], [address], [place], [place] [address] ?  ? Culture  Setup Time   Final  ?  GRAM POSITIVE COCCI IN CLUSTERS ?IN BOTH AEROBIC AND ANAEROBIC BOTTLES ?  ? Culture (A)  Final  ?  STAPHYLOCOCCUS AUREUS ?SUSCEPTIBILITIES PERFORMED ON PREVIOUS CULTURE WITHIN THE LAST 5 DAYS. ?Performed at [facility], [address] [place], [place] [address] ?  ? Report Status 08/05/2021 FINAL  Final  ?Resp Panel by RT-PCR (Flu A&B, Covid) Nasopharyngeal Swab     Status: None  ? Collection Time: 08/02/21  9:33 PM  ? Specimen: Nasopharyngeal Swab; Nasopharyngeal(NP) swabs in vial transport medium  ?Result Value Ref Range Status  ? SARS Coronavirus 2 by RT  PCR NEGATIVE NEGATIVE Final  ?  Comment: (NOTE) ?SARS-CoV-2 target nucleic acids are NOT DETECTED. ? ?The SARS-CoV-2 RNA is generally detectable in upper respiratory ?specimens during the acute phase of infect

## 2021-08-09 NOTE — Progress Notes (Signed)
Report called and given to nurse on 4 east at [facility]. [facility] just arrived and is currently transferring patient to [facility]. ?

## 2021-08-09 NOTE — Progress Notes (Signed)
?  Inpatient Rehab Admissions Coordinator : ? ?Per therapy recommendations patient was screened for CIR candidacy by Danne Baxter RN MSN. Patient is not a candidate for short term rehab for she will need prolonged inpatient hospitalization for IV antibiotics given history of continued IV drug abuse. Please contact me with any questions. ? ?Danne Baxter RN MSN ?Admissions Coordinator ?[phone]  ? ?

## 2021-08-09 NOTE — TOC Progression Note (Signed)
Transition of Care (TOC) - Progression Note  ? ? ?Patient Details  ?Name: Latoya [name] ?MRN: [id number] ?Date of Birth: 1986/01/04 ? ?Transition of Care (TOC) CM/SW Contact  ?Lanier Clam, RN ?Phone Number: ?08/09/2021, 11:46 AM ? ?Clinical Narrative:   Noted for transfer to [facility] for TEE. CIR following. ? ? ? ?Expected Discharge Plan: IP Rehab Facility ?Barriers to Discharge: Continued Medical Work up ? ?Expected Discharge Plan and Services ?Expected Discharge Plan: IP Rehab Facility ?In-house Referral: Clinical Social Work ?  ?  ?Living arrangements for the past 2 months: No permanent address ?                ?  ?  ?  ?  ?  ?  ?  ?  ?  ?  ? ? ?Social Determinants of Health (SDOH) Interventions ?  ? ?Readmission Risk Interventions ?   ? View : No data to display.  ?  ?  ?  ? ? ?

## 2021-08-09 NOTE — Progress Notes (Signed)
Nutrition Follow-up ?RD working remotely. ? ?DOCUMENTATION CODES:  ? ?Not applicable ? ?INTERVENTION:  ?- continue Boost Breeze BID and Ensure once/day. ? ? ?NUTRITION DIAGNOSIS:  ? ?Increased nutrient needs related to acute illness as evidenced by estimated needs. -ongoing ? ?GOAL:  ? ?Patient will meet greater than or equal to 90% of their needs -minimally met to unmet on average ? ?MONITOR:  ? ?PO intake, Supplement acceptance, Labs, Weight trends ? ? ?ASSESSMENT:  ? ?36 y.o. 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 She presented to the ED with severe low back pain for several weeks and intermittent fevers at home. She was admitted with severe sepsis and endocarditis of tricuspid valve. ? ?Limited meal completion percentages documented over the past week: 75% of lunch on 3/20; 25% of breakfast and 50% of lunch and dinner on 3/131; 20% of lunch on 4/3. ? ?She has been accepting Ensure 90% of the time offered and Boost Breeze 100% of the time offered. ? ?Weight has been stable throughout hospitalization. Non-pitting edema to BLE documented in the edema section of flow sheet.  ? ? ?Labs reviewed; Na: 133 mmol/l, Ca: 7.8 mg/dl. ? ?Medications reviewed; 400 mg mag-ox BID, 1 tablet multivitamin with minerals/day, 40 mg oral protonix/day, 100 mg oral thiamine/day, 324 mg fergon/day ? ?IVF; D5-NS @ 50 ml/hr (204 kcal/24 hrs). ? ? ?Diet Order:   ?Diet Order   ? ?       ?  Diet regular Room service appropriate? Yes; Fluid consistency: Thin  Diet effective now       ?  ? ?  ?  ? ?  ? ? ?EDUCATION NEEDS:  ? ?Not appropriate for education at this time ? ?Skin:  Skin Assessment: Reviewed RN Assessment ? ?Last BM:  3/30 (type 6 x1, large amount) ? ?Height:  ? ?Ht Readings from Last 1 Encounters:  ?08/08/21 '5\' 6"'  (1.676 m)  ? ? ?Weight:  ? ?Wt Readings from Last 1  Encounters:  ?08/08/21 82.2 kg  ? ? ? ?BMI:  Body mass index is 29.25 kg/m?. ? ?Estimated Nutritional Needs:  ?Kcal:  2100-2400 kcal ?Protein:  105-120 grams ?Fluid:  >/= 2.3 L/day ? ? ? ? ?Jarome Matin, MS, RD, LDN ?Registered Dietitian II ?Inpatient Clinical Nutrition ?RD pager # and on-call/weekend pager # available in [place]  ? ?

## 2021-08-09 NOTE — Progress Notes (Signed)
Physical Therapy Treatment ?Patient Details ?Name: Latoya [name] ?MRN: [id number] ?DOB: 07-07-1985 ?Today's Date: 08/09/2021 ? ? ?History of Present Illness Latoya [name] is a 36 y.o. female with medical history significant of IVDU, heroin abuse, ongoing. Admitted with back pain, hypotension, hyponatremia, leukocytosis and neutrophilia. Found to have bacteremia possible sepitc pulmonary emboli from endocarditis. ? ?  ?PT Comments  ? ? General Comments: AxO x 3 but with HIGH anxiety and fear of moving that will cause more pain.  Required MAX encouragement and increased time to take her step by step. General bed mobility comments: required MAX encouragement and 75% VC's to attempt "log roll" to minimize her back pain.  Pt was able to partially sit EOB with excessive support throught her B UE's "take pressure off my back".  Pt crying in pain.  Emotional. General transfer comment: arranged BSC 1/4 turn to her RIGHT.  With increased time and MAX encouragement, pt was able to partially stand and scoot pivot to BCS.  Pt was unable to stand erect and unable to complete full turn so had to readjust herself to center.  Pt crying with pain/emotion.  "my pee stinks" stated pt.  Asissted with peri care while she partioally stood using walker.  B flex hips and knees.  Then asisst back to bed per pt request.  Low back pain was her biggest issue with her mobility. General Gait Details: transfers only this session due to poor transfer performance ?Pt was premedicated prior to session and was unable to attempt amb.    ?Recommendations for follow up therapy are one component of a multi-disciplinary discharge planning process, led by the attending physician.  Recommendations may be updated based on patient status, additional functional criteria and insurance authorization. ? ?Follow Up Recommendations ? Acute inpatient rehab (3hours/day) ?  ?  ?Assistance Recommended at Discharge Frequent or constant Supervision/Assistance  ?Patient can  return home with the following Two people to help with walking and/or transfers;A lot of help with bathing/dressing/bathroom ?  ?Equipment Recommendations ?    ?  ?Recommendations for Other Services Rehab consult ? ? ?  ?Precautions / Restrictions Precautions ?Precautions: Fall ?Precaution Comments: monitor HR, pain management ?Restrictions ?Weight Bearing Restrictions: No  ?  ? ?Mobility ? Bed Mobility ?Overal bed mobility: Needs Assistance ?Bed Mobility: Supine to Sit, Sit to Supine ?  ?  ?Supine to sit: Max assist, HOB elevated ?Sit to supine: Max assist ?  ?General bed mobility comments: required MAX encouragement and 75% VC's to attempt "log roll" to minimize her back pain.  Pt was able to partially sit EOB with excessive support throught her B UE's "take pressure off my back".  Pt crying in pain.  Emotional. ?  ? ?Transfers ?Overall transfer level: Needs assistance ?Equipment used: None ?Transfers: Bed to chair/wheelchair/BSC ?  ?Stand pivot transfers: Mod assist ?  ?  ?  ?  ?General transfer comment: arranged BSC 1/4 turn to her RIGHT.  With increased time and MAX encouragement, pt was able to partially stand and scoot pivot to BCS.  Pt was unable to stand erect and unable to complete full turn so had to readjust herself to center.  Pt crying with pain/emotion.  "my pee stinks" stated pt.  Asissted with peri care while she partioally stood using walker.  B flex hips and knees.  Then asisst back to bed per pt request.  Low back pain was her biggest issue with her mobility. ?  ? ?Ambulation/Gait ?  ?  ?  ?  ?  ?  ?  ?  General Gait Details: transfers only this session due to poor transfer peformance ? ? ?Stairs ?  ?  ?  ?  ?  ? ? ?Wheelchair Mobility ?  ? ?Modified Rankin (Stroke Patients Only) ?  ? ? ?  ?Balance   ?  ?  ?  ?  ?  ?  ?  ?  ?  ?  ?  ?  ?  ?  ?  ?  ?  ?  ?  ? ?  ?Cognition Arousal/Alertness: Awake/alert ?Behavior During Therapy: Anxious ?Overall Cognitive Status: Difficult to assess ?  ?  ?  ?  ?  ?   ?  ?  ?  ?  ?  ?  ?  ?  ?  ?  ?General Comments: AxO x 3 but with HIGH anxiety and fear of moving that will cause more pain.  Required MAX encouragement and increased time to take her step by step. ?  ?  ? ?  ?Exercises   ? ?  ?General Comments   ?  ?  ? ?Pertinent Vitals/Pain Pain Assessment ?Pain Assessment: 0-10 ?Pain Score: 10-Worst pain ever ?Pain Location: low back ?Pain Descriptors / Indicators: Grimacing, Guarding, Tightness, Sharp ?Pain Intervention(s): Monitored during session, Premedicated before session, Repositioned  ? ? ?Home Living   ?  ?  ?  ?  ?  ?  ?  ?  ?  ?   ?  ?Prior Function    ?  ?  ?   ? ?PT Goals (current goals can now be found in the care plan section) Progress towards PT goals: Progressing toward goals ? ?  ?Frequency ? ? ? Min 3X/week ? ? ? ?  ?PT Plan Current plan remains appropriate  ? ? ?Co-evaluation   ?  ?  ?  ?  ? ?  ?AM-PAC PT "6 Clicks" Mobility   ?Outcome Measure ? Help needed turning from your back to your side while in a flat bed without using bedrails?: A Lot ?Help needed moving from lying on your back to sitting on the side of a flat bed without using bedrails?: A Lot ?Help needed moving to and from a bed to a chair (including a wheelchair)?: A Lot ?Help needed standing up from a chair using your arms (e.g., wheelchair or bedside chair)?: A Lot ?Help needed to walk in hospital room?: A Lot ?Help needed climbing 3-5 steps with a railing? : Total ?6 Click Score: 11 ? ?  ?End of Session Equipment Utilized During Treatment: Gait belt ?Activity Tolerance: No increased pain ?Patient left: in bed;with call bell/phone within reach;with nursing/sitter in room ?Nurse Communication: Mobility status ?PT Visit Diagnosis: Other abnormalities of gait and mobility (R26.89);Muscle weakness (generalized) (M62.81);Difficulty in walking, not elsewhere classified (R26.2) ?  ? ? ?Time: 0930-1000 ?PT Time Calculation (min) (ACUTE ONLY): 30 min ? ?Charges:  $Therapeutic Activity: 23-37 mins           ?          ? ?{Thedora Rings  PTA ?Acute  Rehabilitation Services ?Pager      [phone] ?Office      [phone] ? ? ?

## 2021-08-09 NOTE — Progress Notes (Signed)
Patient arrived to 4E from [facility]. VSS. Telemetry box applied, CCMD notified. Patient stated pain 10/10 on pain scale mid lower back. Pain medication given. Patient oriented to room and staff. Call bell in reach. ? ?Kenard Gower, RN  ?

## 2021-08-09 NOTE — Progress Notes (Signed)
?PROGRESS NOTE ? ? ? ?Latoya [name]  LGX:[id number] DOB: 21-Aug-1985 DOA: 08/02/2021 ?PCP: Pcp, No  ? ? ?Brief Narrative:  ?Latoya [name] is a 36 y.o. 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 She presented to hospital with complaints of severe low back pain for the last couple of weeks with no other symptoms.  Did complain of subjective fevers at home.  On her initial physical examination her blood pressure was 95/65, HR 97, RR 29 to 33, 02 saturation 97%, lungs clear to auscultation, no rales or wheezing, heart with S1 and S2 present and rhythmic with no gallops, rubs or murmurs, abdomen soft and non tender, no lower extremity edema.  Positive tenderness to palpation lumbar spine. Scabs at the IVDU site.  ? ?In the ED, Na 127, K 2,4. CL 93, bicarbonate 21, glucose 108, bun 24 cr 0.97 AST 53, ALT 19. Lactic acid 2.2. WBC 31, hgb 10,4 hct 30,8, plt 199. Sed rate 86. Covid 19 negative. ?UA SG 1,024, 0-5 wbc, 30 protein.  ? ?Lumbar spine MRI with no signs of infection.  Blood cultures 4/4 bottles positive for staph aureus/ MRSA. Chest radiograph with patchy infiltrates left upper and lower lobe, right upper lobe (mid lung). EKG 101 bpm, normal axis, qtc 527, sinus rhythm, with no significant ST segment or T wave changes.  ? ?Transthoracic echocardiogram with possible vegetation in the tricuspid valve. CT chest with septic pulmonary embolisms.  TEE 4/4 with large highly mobile 1.8 cm tricuspid valve vegetation with severe tricuspid regurgitation. ? ?Consulted CTS, Dr. Kipp Brood with plans angiovac on 4/7; transfer pending to [facility] for surgical intervention.  ? ? ? ?Assessment and Plan: ?* Endocarditis of tricuspid valve ?Patient presenting to the ED with back pain and fevers in the setting of continued IV drug abuse.  For shunts met  criteria for severe sepsis with endorgan failure and hypotension on admission complicated by pulmonary septic emboli and MRSA bacteremia with tricuspid valve endocarditis on echocardiogram. ?--Infectious disease following, appreciate assistance ?--Consulted CTS, Dr. Kipp Brood with plan angiovac on 4/7; transfer pending to [facility] ?--WBC 31.7>>>13.2>13.8 ?--repeat blood cultures 4/2; 1/4 positive for Staph epidermidis, likely contaminant; otherwise remaining bottles no growth x3 days ?--Continue vancomycin, pharmacy consulted for dosing/monitoring ?--CBC daily ? ?Hyponatremia ?Patient presenting with a sodium of 127, likely secondary to poor oral intake/dehydration. ?--Na 127>>133 ?--Continue to encourage increased oral intake ?--Dietitian following ?--BMP daily ? ? ?History of bacterial endocarditis ?TV in 2020, MSSA, got angiovac at [facility]. Treated with Ancef at that time. 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 ?-- Antibiotics and planned angiovac as above ? ?Iron deficiency anemia ?Iron panel with serum iron of 22, transferrin saturation 7, ferritin 51 and TIBC 312. ?--Ferrous gluconate 324 mg p.o. daily ? ?IV drug abuse ([place]) ?Counseled on need for complete cessation given her recurrent tricuspid valve endocarditis now with MRSA. ? ?Opioid use disorder ?Continue pain control with oxycodone 5 mg p.o. q4h PRN moderate pain and Dilaudid 1 mg IV q2h PRN severe pain ? ? ? ? ? ?DVT prophylaxis: Place and maintain sequential compression device Start: 08/07/21 1350 ?enoxaparin (LOVENOX) injection 40 mg Start: 08/02/21 2200 ? ?  Code Status: Full Code ?Family Communication: No family present at bedside this morning. ? ?Disposition Plan:  ?Level of  care: Telemetry Medical ?Status is: Inpatient ?Remains inpatient appropriate because: Continues on IV antibiotics, pending transfer to [facility] for planned surgical invention/angio Vac procedure by CTS for large  tricuspid valve vegetation with associated tricuspid regurgitation.  Will likely need prolonged inpatient hospitalization for IV antibiotics given patient's history of continued IV drug abuse; likely 6 weeks from negative culture today. ?  ? ?Consultants:  ?Infectious disease ?CTS, Dr. Kipp Brood ?Cardiology for TEE ? ?Procedures:  ?TTE ?TEE ? ?Antimicrobials:  ?Vancomycin 3/29 - 3/29 ?Metronidazole 3/29 - 3/29 ? ? ?Subjective: ?Patient seen examined at bedside, resting comfortably.  Sleeping but easily arousable.  Feels tired/fatigued at baseline.  Discussed with patient that results of TEE concerning for large mobile vegetation on her tricuspid valve with associated severe tricuspid regurgitation needing surgery for removal.  Patient in agreement and pending transfer to [facility] for planned surgery on Friday.  No other questions or concerns at this time.  Denies headache, no chest pain, no shortness of breath, no abdominal pain, no nausea/vomit/diarrhea.  No acute events overnight per nursing staff. ? ?Objective: ?Vitals:  ? 08/08/21 1932 08/08/21 2121 08/09/21 0032 08/09/21 0622  ?BP: 111/77 113/77 105/73 104/63  ?Pulse: (!) 117   (!) 104  ?Resp: 18 (!) 22 (!) 22 20  ?Temp: 100.3 ?F (37.9 ?C) 98.5 ?F (36.9 ?C) 98.5 ?F (36.9 ?C) 98.3 ?F (36.8 ?C)  ?TempSrc: Oral Oral Oral Axillary  ?SpO2: 97% 99% 96% 94%  ?Weight:      ?Height:      ? ? ?Intake/Output Summary (Last 24 hours) at 08/09/2021 1056 ?Last data filed at 08/09/2021 1005 ?Gross per 24 hour  ?Intake 1079.97 ml  ?Output 1800 ml  ?Net -720.03 ml  ? ?Filed Weights  ? 08/02/21 2318 08/03/21 1036 08/08/21 1111  ?Weight: 83 kg 82.2 kg 82.2 kg  ? ? ?Examination: ? ?Physical Exam: ?GEN: NAD, alert and oriented x 3, chronically ill appearance, appears older than stated age ?HEENT: NCAT, PERRL, EOMI, sclera clear, MMM ?PULM: CTAB w/o wheezes/crackles, normal respiratory effort, on room air ?CV: RRR w/o M/G/R ?GI: abd soft, NTND, NABS, no R/G/M ?MSK: no peripheral edema,  muscle strength globally intact 5/5 bilateral upper/lower extremities ?NEURO: CN II-XII intact, no focal deficits, sensation to light touch intact ?PSYCH: normal mood/affect ?Integumentary: Scabs noted lower extremities, otherwise skin dry/intact, no rashes or wounds ? ? ? ?Data Reviewed: I have personally reviewed following labs and imaging studies ? ?CBC: ?Recent Labs  ?Lab 08/02/21 ?1442 08/03/21 ?2010 08/05/21 ?0425 08/06/21 ?0712 08/07/21 ?0354 08/08/21 ?1975 08/09/21 ?8832  ?WBC 31.7*   < > 17.4* 17.6* 15.5* 13.2* 13.8*  ?NEUTROABS 27.6*  --  13.6*  --   --   --   --   ?HGB 10.4*   < > 8.5* 7.7* 7.4* 7.5* 7.9*  ?HCT 30.8*   < > 25.9* 23.7* 23.5* 23.5* 25.5*  ?MCV 69.2*   < > 70.2* 71.8* 72.8* 72.5* 73.5*  ?PLT 199   < > 231 265 297 322 378  ? < > = values in this interval not displayed.  ? ?Basic Metabolic Panel: ?Recent Labs  ?Lab 08/02/21 ?1442 08/03/21 ?5498 08/04/21 ?2641 08/05/21 ?0425 08/06/21 ?5830 08/07/21 ?0354 08/08/21 ?9407 08/09/21 ?6808  ?NA 127*   < > 130* 133* 135 135 132* 133*  ?K 2.4*   < > 4.2 4.2 4.1 4.4 4.3 4.1  ?CL 93*   < > 105 105 107 106 103 104  ?CO2 21*   < > 20*  '22 22 24 23 23  ' ?GLUCOSE 108*   < > 95 113* 112* 109* 130* 120*  ?BUN 25*   < > '13 13 10 17 13 19  ' ?CREATININE 0.97   < > 0.65 0.63 0.57 0.53 0.54 0.62  ?CALCIUM 7.8*   < > 7.5* 7.6* 7.4* 7.7* 7.8* 7.8*  ?MG 2.1  --  2.1 2.1  --   --  2.3 2.3  ? < > = values in this interval not displayed.  ? ?GFR: ?Estimated Creatinine Clearance: 107.2 mL/min (by C-G formula based on SCr of 0.62 mg/dL). ?Liver Function Tests: ?Recent Labs  ?Lab 08/02/21 ?1442 08/04/21 ?0627  ?AST 53* 30  ?ALT 19 15  ?ALKPHOS 77 60  ?BILITOT 0.8 0.7  ?PROT 8.2* 6.3*  ?ALBUMIN 2.7* 2.0*  ? ?No results for input(s): LIPASE, AMYLASE in the last 168 hours. ?No results for input(s): AMMONIA in the last 168 hours. ?Coagulation Profile: ?Recent Labs  ?Lab 08/03/21 ?6773  ?INR 1.7*  ? ?Cardiac Enzymes: ?No results for input(s): CKTOTAL, CKMB, CKMBINDEX, TROPONINI in  the last 168 hours. ?BNP (last 3 results) ?No results for input(s): PROBNP in the last 8760 hours. ?HbA1C: ?No results for input(s): HGBA1C in the last 72 hours. ?CBG: ?No results for input(s): GLUCAP in the

## 2021-08-09 NOTE — Plan of Care (Signed)
  Problem: Nutrition: Goal: Adequate nutrition will be maintained Outcome: Progressing   Problem: Coping: Goal: Level of anxiety will decrease Outcome: Progressing   

## 2021-08-10 DIAGNOSIS — I350 Nonrheumatic aortic (valve) stenosis: Secondary | ICD-10-CM | POA: Diagnosis not present

## 2021-08-10 DIAGNOSIS — I339 Acute and subacute endocarditis, unspecified: Secondary | ICD-10-CM | POA: Diagnosis not present

## 2021-08-10 DIAGNOSIS — I079 Rheumatic tricuspid valve disease, unspecified: Secondary | ICD-10-CM | POA: Diagnosis not present

## 2021-08-10 LAB — CBC
HCT: 25 % — ABNORMAL LOW (ref 36.0–46.0)
Hemoglobin: 7.8 g/dL — ABNORMAL LOW (ref 12.0–15.0)
MCH: 23.2 pg — ABNORMAL LOW (ref 26.0–34.0)
MCHC: 31.2 g/dL (ref 30.0–36.0)
MCV: 74.4 fL — ABNORMAL LOW (ref 80.0–100.0)
Platelets: 370 10*3/uL (ref 150–400)
RBC: 3.36 MIL/uL — ABNORMAL LOW (ref 3.87–5.11)
RDW: 20.2 % — ABNORMAL HIGH (ref 11.5–15.5)
WBC: 13.3 10*3/uL — ABNORMAL HIGH (ref 4.0–10.5)
nRBC: 0 % (ref 0.0–0.2)

## 2021-08-10 LAB — BASIC METABOLIC PANEL
Anion gap: 7 (ref 5–15)
BUN: 10 mg/dL (ref 6–20)
CO2: 21 mmol/L — ABNORMAL LOW (ref 22–32)
Calcium: 8.1 mg/dL — ABNORMAL LOW (ref 8.9–10.3)
Chloride: 107 mmol/L (ref 98–111)
Creatinine, Ser: 0.56 mg/dL (ref 0.44–1.00)
GFR, Estimated: >60 mL/min (ref >60)
Glucose, Bld: 110 mg/dL — ABNORMAL HIGH (ref 70–99)
Potassium: 4.3 mmol/L (ref 3.5–5.1)
Sodium: 135 mmol/L (ref 135–145)

## 2021-08-10 LAB — ABO/RH: ABO/RH(D): A POS

## 2021-08-10 MED ORDER — SODIUM CHLORIDE 0.9 % IV SOLN
250.0000 mg | Freq: Every day | INTRAVENOUS | Status: DC
  Administered 2021-08-10 – 2021-08-12 (×3): 250 mg via INTRAVENOUS
  Filled 2021-08-10 (×4): qty 20

## 2021-08-10 MED ORDER — BENZONATATE 100 MG PO CAPS
100.0000 mg | ORAL_CAPSULE | Freq: Three times a day (TID) | ORAL | Status: DC
  Administered 2021-08-10 – 2021-08-19 (×29): 100 mg via ORAL
  Filled 2021-08-10 (×30): qty 1

## 2021-08-10 NOTE — Progress Notes (Signed)
Spoke with RN concerning lab draw request. At this time IV team is unable to draw labs for this patient due to no IV start request. RN aware of [facility] order for lab work patient at this time refusing so RN to notify doctor at this time. ?

## 2021-08-10 NOTE — Progress Notes (Addendum)
?PROGRESS NOTE ? ? ? ?Latoya [name]  AST:[id number] DOB: 05-18-1985 DOA: 08/02/2021 ?PCP: Pcp, No  ?Narrative 35/F with history of IV heroin abuse, history of MSSA tricuspid valve endocarditis in 20/20 treated with Ancef and anterior checked at [facility], history of MRSA discitis/osteomyelitis in 2021 treated with antibiotics presented to the ED with low back pain, cough, fevers.  MRI LS spine negative for infection ?-Found to have MRSA bacteremia with tricuspid valve vegetation, septic pulmonary emboli ?-TEE 4/4 noted large highly mobile 1.8 cm tricuspid valve vegetation with severe TR ?-T CTS consulted, plan for angio [facility] 4/7, transferred to [facility] for surgery ? ? ?Subjective: ?-Feels crummy, hurts all over, some cough ? ?Assessment and Plan: ? ?MRSA tricuspid valve endocarditis ?Severe TR ?Severe sepsis, poa ?-Continue IV vancomycin ?-ID following ?-T CTS consulting, plan for angiovac 4/7 ? ?Cough, septic pulmonary emboli ?-Antibiotics as above, add Mucinex, Tessalon Perles ? ?Hyponatremia ?-Improved, discontinue IV fluids ? ?Iron deficiency anemia ?Iron panel with serum iron of 22, transferrin saturation 7, ferritin 51 and TIBC 312. ?-- Add IV iron ?-CBC in a.m., may need transfusion in the next few days ? ?IV drug abuse ([facility]) ?-Counseled again ? ?Opioid use disorder ?Continue pain control with oxycodone 5 mg p.o. q4h PRN moderate pain and Dilaudid 1 mg IV q2h PRN severe pain ? ?DVT prophylaxis: Lovenox ?Code Status: Full code ?Family Communication: Discussed with patient in detail, no family at bedside ?Disposition Plan: Home after antibiotic therapy completed ? ?Consultants:  ?T CTS, infectious disease ? ?Procedures:  ? ?Antimicrobials:  ? ? ?Objective: ?Vitals:  ? 08/09/21 1952 08/09/21 2353 08/10/21 0000 08/10/21 0825  ?BP: 115/82 115/79  120/86  ?Pulse: (!) 106 99 98 96  ?Resp: 20 (!) 26 20 (!) 22  ?Temp: 98.2 ?F (36.8 ?C) 98.3 ?F (36.8 ?C)  97.9 ?F (36.6 ?C)  ?TempSrc: Oral Oral  Oral  ?SpO2: 100%  96% 97% 98%  ?Weight:      ?Height:      ? ? ?Intake/Output Summary (Last 24 hours) at 08/10/2021 1004 ?Last data filed at 08/10/2021 0818 ?Gross per 24 hour  ?Intake 270 ml  ?Output 3750 ml  ?Net -3480 ml  ? ?Filed Weights  ? 08/03/21 1036 08/08/21 1111 08/09/21 1607  ?Weight: 82.2 kg 82.2 kg 87.5 kg  ? ? ?Examination: ? ?General exam: Chronically ill-appearing, AAOx3 ?Respiratory system: Poor air movement bilaterally ?Cardiovascular system: S1 & S2 heard, RRR.  Diastolic murmur ?Abd: nondistended, soft and nontender.Normal bowel sounds heard. ?Central nervous system: Alert and oriented. No focal neurological deficits. ?Extremities: Scabs on both legs ?Skin: No rashes ?Psychiatry: Flat affect ? ? ?Data Reviewed:  ? ?CBC: ?Recent Labs  ?Lab 08/05/21 ?0425 08/06/21 ?9892 08/07/21 ?1194 08/08/21 ?1740 08/09/21 ?8144 08/10/21 ?8185  ?WBC 17.4* 17.6* 15.5* 13.2* 13.8* 13.3*  ?NEUTROABS 13.6*  --   --   --   --   --   ?HGB 8.5* 7.7* 7.4* 7.5* 7.9* 7.8*  ?HCT 25.9* 23.7* 23.5* 23.5* 25.5* 25.0*  ?MCV 70.2* 71.8* 72.8* 72.5* 73.5* 74.4*  ?PLT 231 265 297 322 378 370  ? ?Basic Metabolic Panel: ?Recent Labs  ?Lab 08/04/21 ?0627 08/05/21 ?0425 08/06/21 ?6314 08/07/21 ?9702 08/08/21 ?6378 08/09/21 ?5885 08/10/21 ?0277  ?NA 130* 133* 135 135 132* 133* 135  ?K 4.2 4.2 4.1 4.4 4.3 4.1 4.3  ?CL 105 105 107 106 103 104 107  ?CO2 20* 22 22 24 23 23  21*  ?GLUCOSE 95 113* 112* 109* 130* 120* 110*  ?  BUN 13 13 10 17 13 19 10   ?CREATININE 0.65 0.63 0.57 0.53 0.54 0.62 0.56  ?CALCIUM 7.5* 7.6* 7.4* 7.7* 7.8* 7.8* 8.1*  ?MG 2.1 2.1  --   --  2.3 2.3  --   ? ?GFR: ?Estimated Creatinine Clearance: 110.4 mL/min (by C-G formula based on SCr of 0.56 mg/dL). ?Liver Function Tests: ?Recent Labs  ?Lab 08/04/21 ?0627  ?AST 30  ?ALT 15  ?ALKPHOS 60  ?BILITOT 0.7  ?PROT 6.3*  ?ALBUMIN 2.0*  ? ?No results for input(s): LIPASE, AMYLASE in the last 168 hours. ?No results for input(s): AMMONIA in the last 168 hours. ?Coagulation Profile: ?No results for  input(s): INR, PROTIME in the last 168 hours. ?Cardiac Enzymes: ?No results for input(s): CKTOTAL, CKMB, CKMBINDEX, TROPONINI in the last 168 hours. ?BNP (last 3 results) ?No results for input(s): PROBNP in the last 8760 hours. ?HbA1C: ?No results for input(s): HGBA1C in the last 72 hours. ?CBG: ?No results for input(s): GLUCAP in the last 168 hours. ?Lipid Profile: ?No results for input(s): CHOL, HDL, LDLCALC, TRIG, CHOLHDL, LDLDIRECT in the last 72 hours. ?Thyroid Function Tests: ?No results for input(s): TSH, T4TOTAL, FREET4, T3FREE, THYROIDAB in the last 72 hours. ?Anemia Panel: ?No results for input(s): VITAMINB12, FOLATE, FERRITIN, TIBC, IRON, RETICCTPCT in the last 72 hours. ?Urine analysis: ?   ?Component Value Date/Time  ? COLORURINE YELLOW 08/02/2021 2028  ? APPEARANCEUR HAZY (A) 08/02/2021 2028  ? LABSPEC 1.012 08/02/2021 2028  ? PHURINE 6.0 08/02/2021 2028  ? GLUCOSEU NEGATIVE 08/02/2021 2028  ? HGBUR SMALL (A) 08/02/2021 2028  ? BILIRUBINUR NEGATIVE 08/02/2021 2028  ? BILIRUBINUR negative 06/21/2021 1755  ? KETONESUR NEGATIVE 08/02/2021 2028  ? PROTEINUR 30 (A) 08/02/2021 2028  ? UROBILINOGEN 0.2 06/21/2021 1755  ? NITRITE NEGATIVE 08/02/2021 2028  ? LEUKOCYTESUR SMALL (A) 08/02/2021 2028  ? ?Sepsis Labs: ?@LABRCNTIP (procalcitonin:4,lacticidven:4) ? ?) ?Recent Results (from the past 240 hour(s))  ?Blood culture (routine x 2)     Status: Abnormal  ? Collection Time: 08/02/21  2:40 PM  ? Specimen: BLOOD  ?Result Value Ref Range Status  ? Specimen Description   Final  ?  BLOOD LEFT ANTECUBITAL ?Performed at [facility], [address], [place], [place] ?  ? Special Requests   Final  ?  BOTTLES DRAWN AEROBIC AND ANAEROBIC Blood Culture results may not be optimal due to an excessive volume of blood received in culture bottles ?Performed at [facility], [address], [place], [place] ?  ? Culture  Setup Time   Final  ?  GRAM POSITIVE COCCI IN  CLUSTERS ?IN BOTH AEROBIC AND ANAEROBIC BOTTLES ?CRITICAL RESULT CALLED TO, READ BACK BY AND VERIFIED WITH: PHARMD TERRY GREEN 08/03/21 @ 9:31 BY DRT ?Performed at [facility], [address], [place], [address] [place] ?  ? Culture METHICILLIN RESISTANT STAPHYLOCOCCUS AUREUS (A)  Final  ? Report Status 08/05/2021 FINAL  Final  ? Organism ID, Bacteria METHICILLIN RESISTANT STAPHYLOCOCCUS AUREUS  Final  ?    Susceptibility  ? Methicillin resistant staphylococcus aureus - MIC*  ?  CIPROFLOXACIN >=8 RESISTANT Resistant   ?  ERYTHROMYCIN >=8 RESISTANT Resistant   ?  GENTAMICIN <=0.5 SENSITIVE Sensitive   ?  OXACILLIN >=4 RESISTANT Resistant   ?  TETRACYCLINE >=16 RESISTANT Resistant   ?  VANCOMYCIN 1 SENSITIVE Sensitive   ?  TRIMETH/SULFA >=320 RESISTANT Resistant   ?  CLINDAMYCIN <=0.25 SENSITIVE Sensitive   ?  RIFAMPIN <=0.5  SENSITIVE Sensitive   ?  Inducible Clindamycin NEGATIVE Sensitive   ?  * METHICILLIN RESISTANT STAPHYLOCOCCUS AUREUS  ?Blood Culture ID Panel (Reflexed)     Status: Abnormal  ? Collection Time: 08/02/21  2:40 PM  ?Result Value Ref Range Status  ? Enterococcus faecalis NOT DETECTED NOT DETECTED Final  ? Enterococcus Faecium NOT DETECTED NOT DETECTED Final  ? Listeria monocytogenes NOT DETECTED NOT DETECTED Final  ? Staphylococcus species DETECTED (A) NOT DETECTED Final  ?  Comment: CRITICAL RESULT CALLED TO, READ BACK BY AND VERIFIED WITH: ?PHARMD TERRY GREEN 08/03/21 @ 9:32 BY DRT ?  ? Staphylococcus aureus (BCID) DETECTED (A) NOT DETECTED Final  ?  Comment: Methicillin (oxacillin)-resistant Staphylococcus aureus (MRSA). MRSA is predictably resistant to beta-lactam antibiotics (except ceftaroline). Preferred therapy is vancomycin unless clinically contraindicated. Patient requires contact precautions if  ?hospitalized. ?CRITICAL RESULT CALLED TO, READ BACK BY AND VERIFIED WITH: ?PHARMD TERRY GREEN 08/03/21 @ 9:32 BY DRT ?  ? Staphylococcus epidermidis NOT DETECTED NOT DETECTED Final  ?  Staphylococcus lugdunensis NOT DETECTED NOT DETECTED Final  ? Streptococcus species NOT DETECTED NOT DETECTED Final  ? Streptococcus agalactiae NOT DETECTED NOT DETECTED Final  ? Streptococcus pneumoniae NOT DETECTED

## 2021-08-10 NOTE — Progress Notes (Signed)
? ? ?[facility] ? ?Date of Admission:  08/02/2021    ? ?Total days of antibiotics 9 ?        ?ASSESSMENT: ? ?Latoya [name]'s has disseminated MRSA infection and blood cultures form 08/06/21 remain without growth to date and agree Staph Epi is most likely contaminant. Scheduled for angiovac with Dr. Cliffton AstersLightfoot tomorrow. Appears to be tolerating vancomycin with no evidence of nephrotoxicity and trough is in therapeutic range. Has a nagging cough that keeps her awake and refractory to robitussin. Will defer additional medication to primary team. Discussed plan of care for prolonged course of antibiotics. Continue current dose of vancomycin. Remaining medical and supportive care per primary team. ? ?PLAN: ? ?Continue vancomycin.  ?Therapeutic drug monitoring of renal function and vancomycin levels. ?Angiovac scheduled for tomorrow with Dr. Cliffton AstersLightfoot. ?Additional cough suppression per primary team. ?Remaining medical and supportive care per primary team.  ? ? ?Principal Problem: ?  Endocarditis of tricuspid valve ?Active Problems: ?  Iron deficiency anemia ?  Opioid use disorder ?  IV drug abuse (HCC) ?  History of bacterial endocarditis ?  Hyponatremia ? ? ? Chlorhexidine Gluconate Cloth  6 each Topical Daily  ? DULoxetine  20 mg Oral Daily  ? enoxaparin (LOVENOX) injection  40 mg Subcutaneous Q24H  ? feeding supplement  1 Container Oral BID BM  ? feeding supplement  237 mL Oral Q24H  ? ferrous gluconate  324 mg Oral Q breakfast  ? magnesium oxide  400 mg Oral BID  ? multivitamin with minerals  1 tablet Oral Daily  ? nicotine  21 mg Transdermal Daily  ? pantoprazole  40 mg Oral Daily  ? thiamine  100 mg Oral Daily  ? ? ?SUBJECTIVE: ? ?Afebrile overnight. Having trouble sleeping secondary to cough and requesting something additional for cough relief.  ? ?Allergies  ?Allergen Reactions  ? Naltrexone Anxiety  ?  Severe anxiety, restlessness, vomiting  ? ? ? ?Review of Systems: ?Review of Systems   ?Constitutional:  Negative for chills, fever and weight loss.  ?Respiratory:  Positive for cough. Negative for shortness of breath and wheezing.   ?Cardiovascular:  Negative for chest pain and leg swelling.  ?Gastrointestinal:  Negative for abdominal pain, constipation, diarrhea, nausea and vomiting.  ?Skin:  Negative for rash.  ? ? ? ?OBJECTIVE: ?Vitals:  ? 08/09/21 1952 08/09/21 2353 08/10/21 0000 08/10/21 0825  ?BP: 115/82 115/79  120/86  ?Pulse: (!) 106 99 98 96  ?Resp: 20 (!) 26 20 (!) 22  ?Temp: 98.2 ?F (36.8 ?C) 98.3 ?F (36.8 ?C)  97.9 ?F (36.6 ?C)  ?TempSrc: Oral Oral  Oral  ?SpO2: 100% 96% 97% 98%  ?Weight:      ?Height:      ? ?Body mass index is 31.14 kg/m?. ? ?Physical Exam ?Constitutional:   ?   General: She is not in acute distress. ?   Appearance: She is well-developed.  ?Cardiovascular:  ?   Rate and Rhythm: Normal rate and regular rhythm.  ?   Heart sounds: Normal heart sounds.  ?Pulmonary:  ?   Effort: Pulmonary effort is normal.  ?   Breath sounds: Normal breath sounds.  ?Skin: ?   General: Skin is warm and dry.  ?Neurological:  ?   Mental Status: She is alert and oriented to person, place, and time.  ?Psychiatric:     ?   Mood and Affect: Mood normal.  ? ? ?Lab Results ?Lab Results  ?Component Value Date  ?  WBC 13.3 (H) 08/10/2021  ? HGB 7.8 (L) 08/10/2021  ? HCT 25.0 (L) 08/10/2021  ? MCV 74.4 (L) 08/10/2021  ? PLT 370 08/10/2021  ?  ?Lab Results  ?Component Value Date  ? CREATININE 0.56 08/10/2021  ? BUN 10 08/10/2021  ? NA 135 08/10/2021  ? K 4.3 08/10/2021  ? CL 107 08/10/2021  ? CO2 21 (L) 08/10/2021  ?  ?Lab Results  ?Component Value Date  ? ALT 15 08/04/2021  ? AST 30 08/04/2021  ? ALKPHOS 60 08/04/2021  ? BILITOT 0.7 08/04/2021  ?  ? ?Microbiology: ?Recent Results (from the past 240 hour(s))  ?Blood culture (routine x 2)     Status: Abnormal  ? Collection Time: 08/02/21  2:40 PM  ? Specimen: BLOOD  ?Result Value Ref Range Status  ? Specimen Description   Final  ?  BLOOD LEFT  ANTECUBITAL ?Performed at [facility], [address] [place], [place] [address] ?  ? Special Requests   Final  ?  BOTTLES DRAWN AEROBIC AND ANAEROBIC Blood Culture results may not be optimal due to an excessive volume of blood received in culture bottles ?Performed at [facility], [address] [place], [place] [address] ?  ? Culture  Setup Time   Final  ?  GRAM POSITIVE COCCI IN CLUSTERS ?IN BOTH AEROBIC AND ANAEROBIC BOTTLES ?CRITICAL RESULT CALLED TO, READ BACK BY AND VERIFIED WITH: PHARMD TERRY GREEN 08/03/21 @ 9:31 BY DRT ?Performed at [facility], [address], [place], [place] [address] ?  ? Culture METHICILLIN RESISTANT STAPHYLOCOCCUS AUREUS (A)  Final  ? Report Status 08/05/2021 FINAL  Final  ? Organism ID, Bacteria METHICILLIN RESISTANT STAPHYLOCOCCUS AUREUS  Final  ?    Susceptibility  ? Methicillin resistant staphylococcus aureus - MIC*  ?  CIPROFLOXACIN >=8 RESISTANT Resistant   ?  ERYTHROMYCIN >=8 RESISTANT Resistant   ?  GENTAMICIN <=0.5 SENSITIVE Sensitive   ?  OXACILLIN >=4 RESISTANT Resistant   ?  TETRACYCLINE >=16 RESISTANT Resistant   ?  VANCOMYCIN 1 SENSITIVE Sensitive   ?  TRIMETH/SULFA >=320 RESISTANT Resistant   ?  CLINDAMYCIN <=0.25 SENSITIVE Sensitive   ?  RIFAMPIN <=0.5 SENSITIVE Sensitive   ?  Inducible Clindamycin NEGATIVE Sensitive   ?  * METHICILLIN RESISTANT STAPHYLOCOCCUS AUREUS  ?Blood Culture ID Panel (Reflexed)     Status: Abnormal  ? Collection Time: 08/02/21  2:40 PM  ?Result Value Ref Range Status  ? Enterococcus faecalis NOT DETECTED NOT DETECTED Final  ? Enterococcus Faecium NOT DETECTED NOT DETECTED Final  ? Listeria monocytogenes NOT DETECTED NOT DETECTED Final  ? Staphylococcus species DETECTED (A) NOT DETECTED Final  ?  Comment: CRITICAL RESULT CALLED TO, READ BACK BY AND VERIFIED WITH: ?PHARMD TERRY GREEN 08/03/21 @ 9:32 BY DRT ?  ? Staphylococcus aureus (BCID) DETECTED (A) NOT DETECTED Final  ?  Comment: Methicillin  (oxacillin)-resistant Staphylococcus aureus (MRSA). MRSA is predictably resistant to beta-lactam antibiotics (except ceftaroline). Preferred therapy is vancomycin unless clinically contraindicated. Patient requires contact precautions if  ?hospitalized. ?CRITICAL RESULT CALLED TO, READ BACK BY AND VERIFIED WITH: ?PHARMD TERRY GREEN 08/03/21 @ 9:32 BY DRT ?  ? Staphylococcus epidermidis NOT DETECTED NOT DETECTED Final  ? Staphylococcus lugdunensis NOT DETECTED NOT DETECTED Final  ? Streptococcus species NOT DETECTED NOT DETECTED Final  ? Streptococcus agalactiae NOT DETECTED NOT DETECTED Final  ? Streptococcus pneumoniae NOT DETECTED NOT DETECTED Final  ? Streptococcus pyogenes NOT DETECTED NOT DETECTED Final  ?  A.calcoaceticus-baumannii NOT DETECTED NOT DETECTED Final  ? Bacteroides fragilis NOT DETECTED NOT DETECTED Final  ? Enterobacterales NOT DETECTED NOT DETECTED Final  ? Enterobacter cloacae complex NOT DETECTED NOT DETECTED Final  ? Escherichia coli NOT DETECTED NOT DETECTED Final  ? Klebsiella aerogenes NOT DETECTED NOT DETECTED Final  ? Klebsiella oxytoca NOT DETECTED NOT DETECTED Final  ? Klebsiella pneumoniae NOT DETECTED NOT DETECTED Final  ? Proteus species NOT DETECTED NOT DETECTED Final  ? Salmonella species NOT DETECTED NOT DETECTED Final  ? Serratia marcescens NOT DETECTED NOT DETECTED Final  ? Haemophilus influenzae NOT DETECTED NOT DETECTED Final  ? Neisseria meningitidis NOT DETECTED NOT DETECTED Final  ? Pseudomonas aeruginosa NOT DETECTED NOT DETECTED Final  ? Stenotrophomonas maltophilia NOT DETECTED NOT DETECTED Final  ? Candida albicans NOT DETECTED NOT DETECTED Final  ? Candida auris NOT DETECTED NOT DETECTED Final  ? Candida glabrata NOT DETECTED NOT DETECTED Final  ? Candida krusei NOT DETECTED NOT DETECTED Final  ? Candida parapsilosis NOT DETECTED NOT DETECTED Final  ? Candida tropicalis NOT DETECTED NOT DETECTED Final  ? Cryptococcus neoformans/gattii NOT DETECTED NOT DETECTED Final  ?  Meth resistant mecA/C and MREJ DETECTED (A) NOT DETECTED Final  ?  Comment: CRITICAL RESULT CALLED TO, READ BACK BY AND VERIFIED WITH: ?PHARMD TERRY GREEN 08/03/21 @ 9:32 BY DRT ?Performed at [facility], [address]

## 2021-08-10 NOTE — Consult Note (Signed)
? ?   ?[address] ?      [place] [address] ?            [phone]   ?                 ?Leliana Cathers ?[facility] Medical Record #[id number] ?Date of Birth: 1986/03/05 ? ?Referring: No ref. provider found ?Primary Care: Pcp, No ?Primary Cardiologist: None ? ?Chief Complaint:    ?Chief Complaint  ?Patient presents with  ? Drug Problem  ? Back Pain  ? Leg Pain  ? Dysuria  ? ? ?History of Present Illness:    ?Latoya [name] 36 y.o. female with history of IV drug abuse is admitted to the hospital with MRSA tricuspid valve endocarditis.  She has a previous history of endocarditis and was treated at [facility] as well as MRSA osteomyelitis treated in 2021. ?  ? ?Past Medical History:  ?Diagnosis Date  ? Heroin addiction (HCC)   ? IV drug abuse (HCC)   ? ? ?Past Surgical History:  ?Procedure Laterality Date  ? IR LUMBAR DISC ASPIRATION W/IMG GUIDE  07/29/2019  ? TEE WITHOUT CARDIOVERSION N/A 08/08/2021  ? Procedure: TRANSESOPHAGEAL ECHOCARDIOGRAM (TEE);  Surgeon: Wendall Stade, MD;  Location: [facility] ENDOSCOPY;  Service: Cardiovascular;  Laterality: N/A;  ? ? ?Family History  ?Problem Relation Age of Onset  ? Healthy Mother   ? ? ? ?Social History  ? ?Tobacco Use  ?Smoking Status Light Smoker  ? Types: Cigarettes  ?Smokeless Tobacco Never  ?  ?Social History  ? ?Substance and Sexual Activity  ?Alcohol Use Yes  ? ? ? ?Allergies  ?Allergen Reactions  ? Naltrexone Anxiety  ?  Severe anxiety, restlessness, vomiting  ? ? ?Current Facility-Administered Medications  ?Medication Dose Route Frequency Provider Last Rate Last Admin  ? acetaminophen (TYLENOL) tablet 650 mg  650 mg Oral Q6H PRN Pokhrel, Laxman, MD   650 mg at 08/10/21 0425  ? albuterol (VENTOLIN HFA) 108 (90 Base) MCG/ACT inhaler 2 puff  2 puff Inhalation Q6H PRN Pokhrel, Laxman, MD      ? benzonatate (TESSALON) capsule 100 mg  100 mg Oral TID Zannie Cove, MD      ? Chlorhexidine Gluconate Cloth 2 % PADS 6 each  6 each Topical Daily Hillary Bow, DO   6 each at 08/10/21 6734  ? DULoxetine (CYMBALTA) DR capsule 20 mg  20 mg Oral Daily Arrien, York Ram, MD   20 mg at 08/10/21 0803  ? enoxaparin (LOVENOX) injection 40 mg  40 mg Subcutaneous Q24H Lyda Perone M, DO   40 mg at 08/04/21 2303  ? feeding supplement (BOOST / RESOURCE BREEZE) liquid 1 Container  1 Container Oral BID BM Arrien, York Ram, MD   1 Container at 08/09/21 1034  ? feeding supplement (ENSURE ENLIVE / ENSURE PLUS) liquid 237 mL  237 mL Oral Q24H Coralie Keens, MD   237 mL at 08/08/21 2128  ? ferric gluconate (FERRLECIT) 250 mg in sodium chloride 0.9 % 250 mL IVPB  250 mg Intravenous Daily Zannie Cove, MD      ? ferrous gluconate [facility]) tablet 324 mg  324 mg Oral Q breakfast Coralie Keens, MD   324 mg at 08/10/21 0803  ? guaiFENesin-dextromethorphan (ROBITUSSIN DM) 100-10 MG/5ML syrup 5 mL  5 mL Oral Q4H PRN Pokhrel, Laxman, MD   5 mL at 08/10/21 0636  ? HYDROmorphone (DILAUDID) injection 1 mg  1 mg Intravenous Q2H  PRN [place], Eric J, DO   1 mg at 08/10/21 1054  ? ketorolac (TORADOL) 15 MG/ML injection 15 mg  15 mg Intravenous Q8H PRN Pokhrel, Laxman, MD   15 mg at 08/10/21 0815  ? LORazepam (ATIVAN) tablet 1 mg  1 mg Oral Q4H PRN Arrien, York RamMauricio Daniel, MD   1 mg at 08/08/21 1741  ? magnesium oxide (MAG-OX) tablet 400 mg  400 mg Oral BID Pokhrel, Laxman, MD   400 mg at 08/10/21 0803  ? multivitamin with minerals tablet 1 tablet  1 tablet Oral Daily Arrien, York RamMauricio Daniel, MD   1 tablet at 08/10/21 0803  ? nicotine (NICODERM CQ - dosed in mg/24 hours) patch 21 mg  21 mg Transdermal Daily Coralie KeensArrien, Mauricio Daniel, MD   21 mg at 08/08/21 [id number]  ? oxyCODONE (Oxy IR/ROXICODONE) immediate release tablet 5 mg  5 mg Oral Q4H PRN Arrien, York RamMauricio Daniel, MD   5 mg at 08/10/21 0803  ? pantoprazole (PROTONIX) EC tablet 40 mg  40 mg Oral Daily Arrien, York RamMauricio Daniel, MD   40 mg at 08/10/21 0803  ? thiamine tablet 100 mg  100 mg Oral Daily Arrien,  York RamMauricio Daniel, MD   100 mg at 08/10/21 0803  ? vancomycin (VANCOREADY) IVPB 750 mg/150 mL  750 mg Intravenous Q8H Pokhrel, Laxman, MD 150 mL/hr at 08/10/21 0811 750 mg at 08/10/21 [id number]  ? ? ?Review of Systems  ?Constitutional:  Positive for fever and malaise/fatigue.  ?Musculoskeletal:  Positive for myalgias.  ? ?PHYSICAL EXAMINATION: ?BP 110/78 (BP Location: Left Arm)   Pulse (!) 102   Temp 98.5 ?F (36.9 ?C) (Oral)   Resp 18   Ht 5\' 6"  (1.676 m)   Wt 87.5 kg   SpO2 96%   BMI 31.14 kg/m?  ? ?Physical Exam ?Constitutional:   ?   General: She is not in acute distress. ?   Appearance: She is ill-appearing.  ?Cardiovascular:  ?   Rate and Rhythm: Tachycardia present.  ?Pulmonary:  ?   Effort: Pulmonary effort is normal. No respiratory distress.  ?Neurological:  ?   Mental Status: She is alert.  ?  ? ?Diagnostic Studies & Laboratory data: ?   ? Recent Radiology Findings:  ? CT CHEST W CONTRAST ? ?Result Date: 08/04/2021 ?CLINICAL DATA:  Evaluate for septic emboli. EXAM: CT CHEST WITH CONTRAST TECHNIQUE: Multidetector CT imaging of the chest was performed during intravenous contrast administration. RADIATION DOSE REDUCTION: This exam was performed according to the departmental dose-optimization program which includes automated exposure control, adjustment of the mA and/or kV according to patient size and/or use of iterative reconstruction technique. CONTRAST:  75mL OMNIPAQUE IOHEXOL 300 MG/ML  SOLN COMPARISON:  03/24/2021 FINDINGS: Cardiovascular: Mild cardiac enlargement.  No pericardial effusion. Mediastinum/Nodes: Thyroid gland, trachea and esophagus are unremarkable. Enlarged right hilar lymph node measures 1.6 cm short axis, image 55/2. Unchanged from previous exam. No enlarged mediastinal or axillary lymph nodes. Lungs/Pleura: Small bilateral pleural effusions with overlying atelectasis. Extensive bilateral cavitary lung nodules are identified involving the upper and lower lung zones compatible with septic  emboli. Sample index nodules include: -left upper lobe cavitary nodule measures 1.8 cm, image 41/7. -right upper lobe cavitary lung nodule measures 1.9 cm, image 47/7. -posterior basal right upper lobe cavitary nodule measures 3.1 cm, image 58/7. -right middle lobe subpleural cavitary lung nodule measures 1.8 cm, image 82/7. -non cavitary lung nodule within the superior segment of right lower lobe measures 1 cm, image 57/7. Upper Abdomen: The spleen appears  enlarged measuring 16 cm AP, image 114/2. Left adrenal nodule measures 1.9 cm, image 134/2. This is unchanged from 10/27/2019. No follow-up of this nodule is indicated. Musculoskeletal: No chest wall abnormality. No acute or significant osseous findings. IMPRESSION: 1. Extensive bilateral cavitary lung nodules compatible with septic emboli. 2. Small bilateral pleural effusions with overlying atelectasis. 3. Splenomegaly. Electronically Signed   By: Signa Kell M.D.   On: 08/04/2021 14:59  ? ?MR THORACIC SPINE W WO CONTRAST ? ?Result Date: 08/05/2021 ?CLINICAL DATA:  Initial evaluation for osteomyelitis. EXAM: MRI THORACIC WITHOUT AND WITH CONTRAST TECHNIQUE: Multiplanar and multiecho pulse sequences of the thoracic spine were obtained without and with intravenous contrast. CONTRAST:  7mL GADAVIST GADOBUTROL 1 MMOL/ML IV SOLN COMPARISON:  Comparison made with MRI of the lumbar spine from 08/02/2021. FINDINGS: Alignment: Mild straightening of the normal thoracic kyphosis. No listhesis. Vertebrae: Vertebral body height maintained without acute or chronic fracture. Diffusely decreased T1 signal intensity seen throughout the visualized bone marrow, nonspecific, but most commonly related to anemia, smoking, or obesity. No discrete or worrisome osseous lesions. No abnormal marrow edema or enhancement. No evidence for osteomyelitis discitis or septic arthritis. Cord: Normal signal and morphology. No epidural abscess or other collection. Paraspinal and other soft  tissues: No significant paraspinous edema. No visible soft tissue collections. Multiple cavitary nodule seen throughout the visualized lungs, consistent with previously identified septic emboli. Small layering bilateral pleura

## 2021-08-10 NOTE — Progress Notes (Signed)
Physical Therapy Treatment ?Patient Details ?Name: Latoya [name] ?MRN: [id number] ?DOB: April 05, 1986 ?Today's Date: 08/10/2021 ? ? ?History of Present Illness Latoya [name] is a 36 y.o. female admitted with back pain, hypotension, hyponatremia, leukocytosis and neutrophilia. Found to have bacteremia possible sepitc pulmonary emboli from endocarditis. Plan for OR for angio vac debridement on 4/7. PMH: IVDU, heroin abuse, ongoing. ? ?  ?PT Comments  ? ? Pt received in supine, agreeable to limited bed-level session with encouragement. Pt reports improved pain score/comfort after repositioning to semi-sidelying and supine LE exercises. Supine HEP handout given for reinforcement. Emphasis on repositioning frequency, risks of immobility and pain relief measures including repositioning and cryotherapy. Pt refusing EOB/OOB mobility due to pain/fatigue. Pt continues to benefit from PT services to progress toward functional mobility goals.    ?Recommendations for follow up therapy are one component of a multi-disciplinary discharge planning process, led by the attending physician.  Recommendations may be updated based on patient status, additional functional criteria and insurance authorization. ? ?Follow Up Recommendations ? Acute inpatient rehab (3hours/day) (pending progress post-op) ?  ?  ?Assistance Recommended at Discharge Frequent or constant Supervision/Assistance  ?Patient can return home with the following Two people to help with walking and/or transfers;A lot of help with bathing/dressing/bathroom ?  ?Equipment Recommendations ? Other (comment) (TBA)  ?  ?Recommendations for Other Services Rehab consult ? ? ?  ?Precautions / Restrictions Precautions ?Precautions: Fall ?Precaution Comments: Contact precs, monitor HR, pain management ?Restrictions ?Weight Bearing Restrictions: No  ?  ? ?Mobility ? Bed Mobility ?Overal bed mobility: Needs Assistance ?Bed Mobility: Rolling ?Rolling: Min guard ?  ?  ?  ?  ?General bed  mobility comments: required MAX encouragement to attempt "log roll" to minimize her back pain and for insertion of pillow behind L low back for pressure relief; discussed pressure offloading frequency q2h ?  ? ?Transfers ?  ?  ?  ?General transfer comment: pt declined EOB/OOB mobility ?  ? ? ?  ?   ?Cognition Arousal/Alertness: Awake/alert ?Behavior During Therapy: Anxious ?Overall Cognitive Status: No family/caregiver present to determine baseline cognitive functioning ?Area of Impairment: Safety/judgement, Memory ?  ?  ?  ?  ?  ?  ?  ?  ?  ?  ?Memory: Decreased short-term memory ?  ?Safety/Judgement: Decreased awareness of safety, Decreased awareness of deficits ?  ?  ?General Comments: Pt with HIGH anxiety and fear of moving that will cause more pain. Required MAX encouragement and increased time to take her step by step. ?  ?  ? ?  ?Exercises Other Exercises ?Other Exercises: supine BLE AROM: ankle pumps, SAQ, hip abduction, heel slides x5 reps ea x 2 sets ea ?Other Exercises: supine BLE HEP "General Strengthening" handout given to reinforce ? ?  ?General Comments General comments (skin integrity, edema, etc.): HR 100-104 bpm resting and with supine LE exercises; SpO2 94-95% on RA; BP 121/70 ?  ?  ? ?Pertinent Vitals/Pain Pain Assessment ?Pain Assessment: Faces ?Faces Pain Scale: Hurts whole lot ?Pain Location: low back, LLE>RLE, "all over" ?Pain Descriptors / Indicators: Grimacing, Guarding, Tightness, Sharp, Constant ?Pain Intervention(s): Monitored during session, Repositioned, Other (comment), Limited activity within patient's tolerance, RN gave pain meds during session (discussed benefits of repositioning and cryotherapy)  ? ? ? ?PT Goals (current goals can now be found in the care plan section) Acute Rehab PT Goals ?Patient Stated Goal: less pain, to move around better ?PT Goal Formulation: With patient ?Time For Goal Achievement: 08/18/21 ?Progress towards PT  goals: Progressing toward goals ? ?   ?Frequency ? ? ? Min 3X/week ? ? ? ?  ?PT Plan Current plan remains appropriate  ? ? ?Co-evaluation   ?  ?  ?  ?  ? ?  ?AM-PAC PT "6 Clicks" Mobility   ?Outcome Measure ? Help needed turning from your back to your side while in a flat bed without using bedrails?: A Little ?Help needed moving from lying on your back to sitting on the side of a flat bed without using bedrails?: A Lot ?Help needed moving to and from a bed to a chair (including a wheelchair)?: A Lot ?Help needed standing up from a chair using your arms (e.g., wheelchair or bedside chair)?: Total ?Help needed to walk in hospital room?: Total ?Help needed climbing 3-5 steps with a railing? : Total ?6 Click Score: 10 ? ?  ?End of Session   ?Activity Tolerance: Patient limited by pain;Patient limited by fatigue;Other (comment) (noted pale pallor, clammy appearance, tachy at rest (104 bpm)) ?Patient left: in bed;with call bell/phone within reach;with bed alarm set;Other (comment) (heels floated, sidelying to R) ?Nurse Communication: Mobility status;Other (comment) (pt reporting severe pain) ?PT Visit Diagnosis: Other abnormalities of gait and mobility (R26.89);Muscle weakness (generalized) (M62.81);Difficulty in walking, not elsewhere classified (R26.2) ?  ? ? ?Time: 6314-9702 ?PT Time Calculation (min) (ACUTE ONLY): 27 min ? ?Charges:  $Therapeutic Exercise: 8-22 mins ?$Therapeutic Activity: 8-22 mins          ?          ? ?Latoya [name] P., PTA ?Acute Rehabilitation Services ?Secure Chat Preferred 9a-5:30pm ?Office: [phone]  ? ? ?Latoya [name] Latoya [name] ?08/10/2021, 5:49 PM ? ?

## 2021-08-11 ENCOUNTER — Inpatient Hospital Stay (HOSPITAL_COMMUNITY): Payer: Medicaid Other | Admitting: Certified Registered Nurse Anesthetist

## 2021-08-11 ENCOUNTER — Inpatient Hospital Stay (HOSPITAL_COMMUNITY): Payer: Medicaid Other

## 2021-08-11 ENCOUNTER — Encounter (HOSPITAL_COMMUNITY): Payer: Self-pay | Admitting: Internal Medicine

## 2021-08-11 ENCOUNTER — Other Ambulatory Visit: Payer: Self-pay

## 2021-08-11 ENCOUNTER — Encounter (HOSPITAL_COMMUNITY)
Admission: EM | Disposition: A | Discharge status: Home / Self Care | Payer: Self-pay | Source: Home / Self Care | Attending: Family Medicine

## 2021-08-11 DIAGNOSIS — A419 Sepsis, unspecified organism: Secondary | ICD-10-CM

## 2021-08-11 DIAGNOSIS — J90 Pleural effusion, not elsewhere classified: Secondary | ICD-10-CM | POA: Diagnosis not present

## 2021-08-11 DIAGNOSIS — I079 Rheumatic tricuspid valve disease, unspecified: Secondary | ICD-10-CM

## 2021-08-11 DIAGNOSIS — D649 Anemia, unspecified: Secondary | ICD-10-CM | POA: Diagnosis not present

## 2021-08-11 DIAGNOSIS — I071 Rheumatic tricuspid insufficiency: Secondary | ICD-10-CM | POA: Diagnosis not present

## 2021-08-11 DIAGNOSIS — M1990 Unspecified osteoarthritis, unspecified site: Secondary | ICD-10-CM | POA: Diagnosis not present

## 2021-08-11 DIAGNOSIS — I2690 Septic pulmonary embolism without acute cor pulmonale: Secondary | ICD-10-CM | POA: Diagnosis not present

## 2021-08-11 HISTORY — PX: TEE WITHOUT CARDIOVERSION: SHX5443

## 2021-08-11 HISTORY — PX: APPLICATION OF ANGIOVAC: SHX6777

## 2021-08-11 LAB — CULTURE, BLOOD (ROUTINE X 2)
Culture: NO GROWTH
Special Requests: ADEQUATE

## 2021-08-11 LAB — BASIC METABOLIC PANEL
Anion gap: 7 (ref 5–15)
BUN: 11 mg/dL (ref 6–20)
CO2: 21 mmol/L — ABNORMAL LOW (ref 22–32)
Calcium: 8.4 mg/dL — ABNORMAL LOW (ref 8.9–10.3)
Chloride: 107 mmol/L (ref 98–111)
Creatinine, Ser: 0.61 mg/dL (ref 0.44–1.00)
GFR, Estimated: >60 mL/min (ref >60)
Glucose, Bld: 117 mg/dL — ABNORMAL HIGH (ref 70–99)
Potassium: 4.2 mmol/L (ref 3.5–5.1)
Sodium: 135 mmol/L (ref 135–145)

## 2021-08-11 LAB — ECHO INTRAOPERATIVE TEE
Height: 66 in
Weight: 3086.44 oz

## 2021-08-11 LAB — POCT I-STAT, CHEM 8
BUN: 9 mg/dL (ref 6–20)
Calcium, Ion: 1.23 mmol/L (ref 1.15–1.40)
Chloride: 105 mmol/L (ref 98–111)
Creatinine, Ser: 0.4 mg/dL — ABNORMAL LOW (ref 0.44–1.00)
Glucose, Bld: 100 mg/dL — ABNORMAL HIGH (ref 70–99)
HCT: 25 % — ABNORMAL LOW (ref 36.0–46.0)
Hemoglobin: 8.5 g/dL — ABNORMAL LOW (ref 12.0–15.0)
Potassium: 4.2 mmol/L (ref 3.5–5.1)
Sodium: 139 mmol/L (ref 135–145)
TCO2: 25 mmol/L (ref 22–32)

## 2021-08-11 LAB — CBC
HCT: 29 % — ABNORMAL LOW (ref 36.0–46.0)
Hemoglobin: 8.7 g/dL — ABNORMAL LOW (ref 12.0–15.0)
MCH: 22.4 pg — ABNORMAL LOW (ref 26.0–34.0)
MCHC: 30 g/dL (ref 30.0–36.0)
MCV: 74.6 fL — ABNORMAL LOW (ref 80.0–100.0)
Platelets: 309 10*3/uL (ref 150–400)
RBC: 3.89 MIL/uL (ref 3.87–5.11)
RDW: 20.9 % — ABNORMAL HIGH (ref 11.5–15.5)
WBC: 12.1 10*3/uL — ABNORMAL HIGH (ref 4.0–10.5)
nRBC: 0.2 % (ref 0.0–0.2)

## 2021-08-11 LAB — TYPE AND SCREEN
ABO/RH(D): A POS
Antibody Screen: NEGATIVE

## 2021-08-11 IMAGING — DX DG CHEST 1V PORT SAME DAY
1 series · 1 of 1 positions shown · non-contrast
Comparison: Radiographs [DATE] and [DATE].  CT [DATE].

CLINICAL DATA: Postoperative state. Right IJ central line
placement.

EXAM:
PORTABLE CHEST 1 VIEW

[chest]
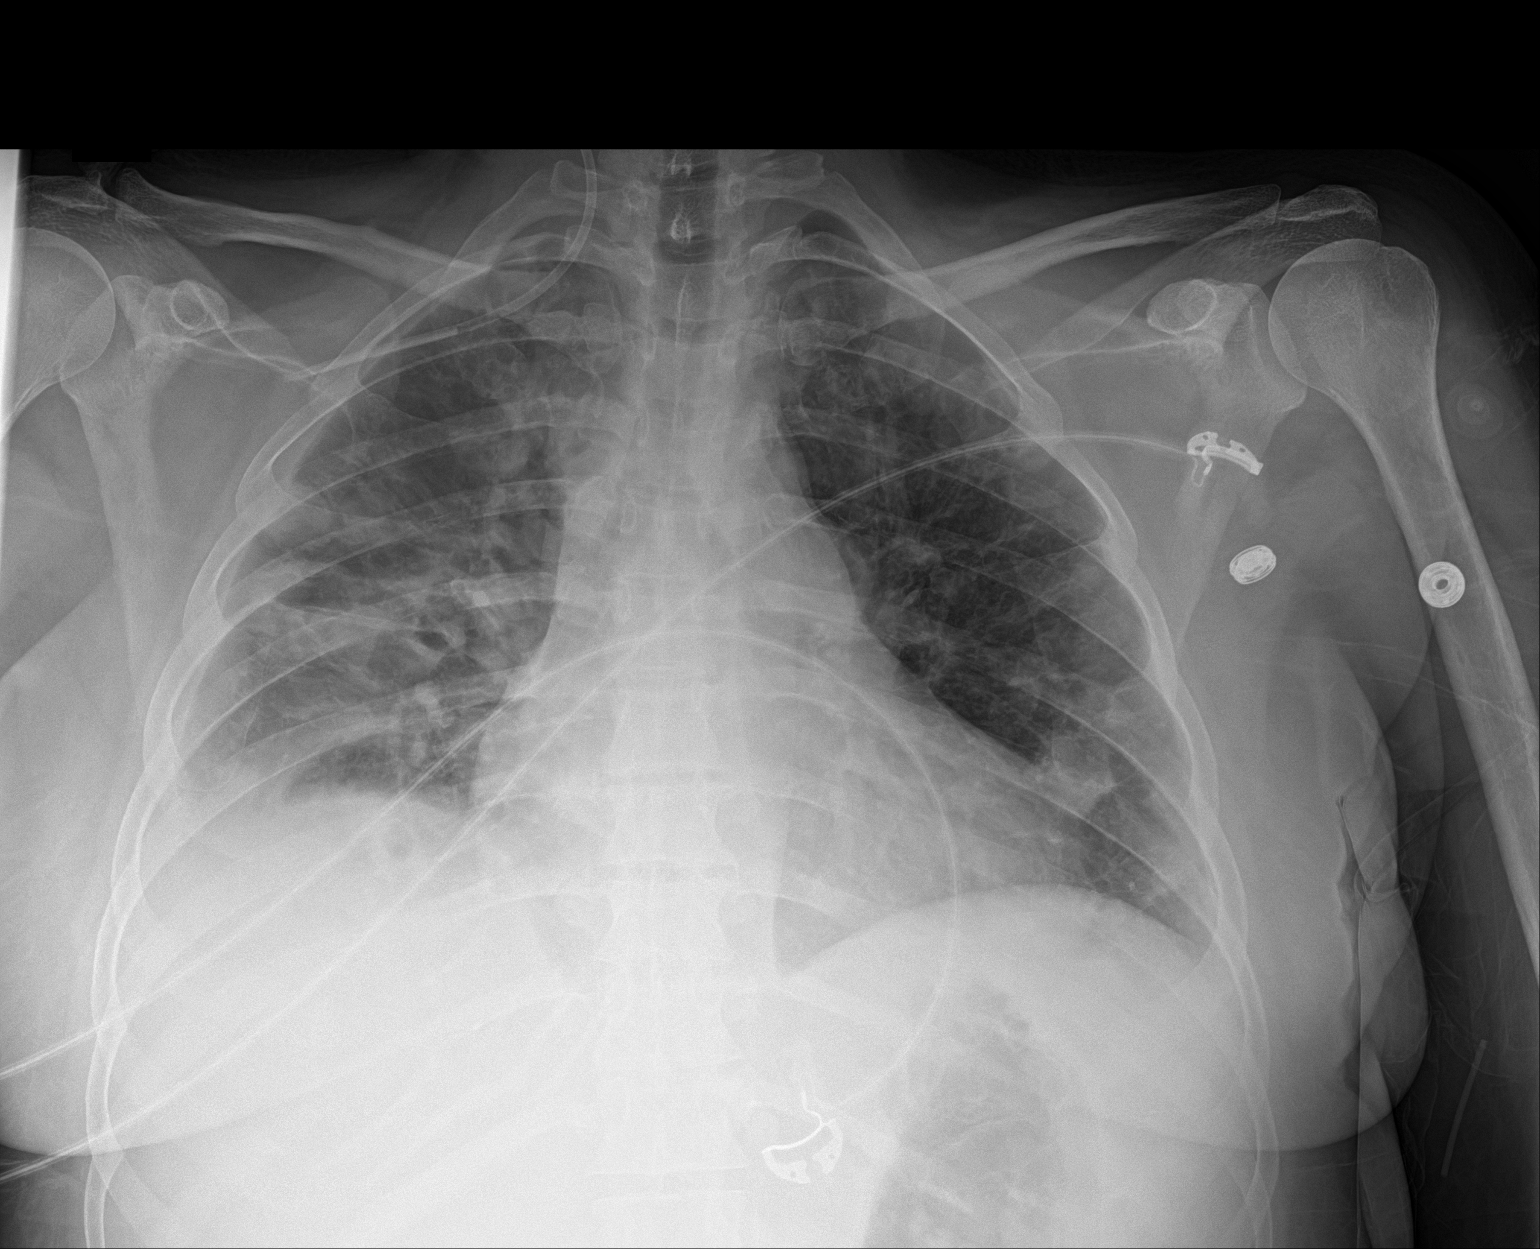

[1 of 1 positions shown; findings below may reference images not displayed]

FINDINGS: [FX] hours. A new right IJ central venous catheter projects
laterally along the expected course of the right subclavian vein.
The heart size and mediastinal contours are stable. There is a small
right pleural effusion, but no evidence of pneumothorax. Bilateral
pulmonary nodularity has increased from the prior radiographs,
although is probably slightly improved from the more recent CT. The
bones appear unchanged.
IMPRESSION: 1. Recently placed right IJ central venous catheter projects
laterally along the course of the right subclavian vein.
Repositioning recommended.
2. No evidence of pneumothorax. Small right pleural effusion with
improved septic emboli compared with CT of 1 week ago.
3. These results will be called to the ordering clinician or
representative by the Radiologist Assistant, and communication
documented in the PACS or [REDACTED].

## 2021-08-11 SURGERY — APPLICATION OF ANGIOVAC
Anesthesia: General

## 2021-08-11 MED ORDER — LIDOCAINE 2% (20 MG/ML) 5 ML SYRINGE
INTRAMUSCULAR | Status: AC
  Filled 2021-08-11: qty 5

## 2021-08-11 MED ORDER — ROCURONIUM BROMIDE 10 MG/ML (PF) SYRINGE
PREFILLED_SYRINGE | INTRAVENOUS | Status: DC | PRN
  Administered 2021-08-11: 70 mg via INTRAVENOUS

## 2021-08-11 MED ORDER — HEPARIN SODIUM (PORCINE) 1000 UNIT/ML IJ SOLN
INTRAMUSCULAR | Status: AC
  Filled 2021-08-11: qty 1

## 2021-08-11 MED ORDER — 0.9 % SODIUM CHLORIDE (POUR BTL) OPTIME: TOPICAL | Status: DC | PRN

## 2021-08-11 MED ORDER — ONDANSETRON HCL 4 MG/2ML IJ SOLN
INTRAMUSCULAR | Status: AC
  Filled 2021-08-11: qty 2

## 2021-08-11 MED ORDER — LACTATED RINGERS IV SOLN: INTRAVENOUS | Status: DC

## 2021-08-11 MED ORDER — ONDANSETRON HCL 4 MG/2ML IJ SOLN
INTRAMUSCULAR | Status: DC | PRN
  Administered 2021-08-11: 4 mg via INTRAVENOUS

## 2021-08-11 MED ORDER — CHLORHEXIDINE GLUCONATE 0.12 % MT SOLN: 15.0000 mL | Freq: Once | OROMUCOSAL | Status: AC

## 2021-08-11 MED ORDER — ROCURONIUM BROMIDE 10 MG/ML (PF) SYRINGE
PREFILLED_SYRINGE | INTRAVENOUS | Status: AC
  Filled 2021-08-11: qty 10

## 2021-08-11 MED ORDER — FENTANYL CITRATE (PF) 250 MCG/5ML IJ SOLN
INTRAMUSCULAR | Status: DC | PRN
  Administered 2021-08-11 (×3): 50 ug via INTRAVENOUS
  Administered 2021-08-11 (×2): 25 ug via INTRAVENOUS

## 2021-08-11 MED ORDER — CHLORHEXIDINE GLUCONATE 0.12 % MT SOLN
OROMUCOSAL | Status: AC
  Administered 2021-08-11: 15 mL via OROMUCOSAL
  Filled 2021-08-11: qty 15

## 2021-08-11 MED ORDER — DEXAMETHASONE SODIUM PHOSPHATE 10 MG/ML IJ SOLN
INTRAMUSCULAR | Status: DC | PRN
  Administered 2021-08-11: 5 mg via INTRAVENOUS

## 2021-08-11 MED ORDER — PROPOFOL 10 MG/ML IV BOLUS
INTRAVENOUS | Status: DC | PRN
  Administered 2021-08-11: 110 mg via INTRAVENOUS

## 2021-08-11 MED ORDER — MIDAZOLAM HCL 2 MG/2ML IJ SOLN
INTRAMUSCULAR | Status: AC
Start: 2021-08-11 — End: ?
  Filled 2021-08-11: qty 2

## 2021-08-11 MED ORDER — SUGAMMADEX SODIUM 200 MG/2ML IV SOLN
INTRAVENOUS | Status: DC | PRN
  Administered 2021-08-11: 200 mg via INTRAVENOUS

## 2021-08-11 MED ORDER — DEXAMETHASONE SODIUM PHOSPHATE 10 MG/ML IJ SOLN
INTRAMUSCULAR | Status: AC
  Filled 2021-08-11: qty 1

## 2021-08-11 MED ORDER — MIDAZOLAM HCL 2 MG/2ML IJ SOLN
INTRAMUSCULAR | Status: DC | PRN
  Administered 2021-08-11 (×2): 1 mg via INTRAVENOUS

## 2021-08-11 MED ORDER — IODIXANOL 320 MG/ML IV SOLN: INTRAVENOUS | Status: DC | PRN

## 2021-08-11 MED ORDER — PROPOFOL 10 MG/ML IV BOLUS
INTRAVENOUS | Status: AC
  Filled 2021-08-11: qty 20

## 2021-08-11 MED ORDER — HEPARIN 6000 UNIT IRRIGATION SOLUTION: Status: DC | PRN

## 2021-08-11 MED ORDER — FENTANYL CITRATE (PF) 250 MCG/5ML IJ SOLN
INTRAMUSCULAR | Status: AC
  Filled 2021-08-11: qty 5

## 2021-08-11 MED ORDER — LIDOCAINE 2% (20 MG/ML) 5 ML SYRINGE
INTRAMUSCULAR | Status: DC | PRN
  Administered 2021-08-11: 100 mg via INTRAVENOUS

## 2021-08-11 MED ORDER — MINERAL OIL LIGHT 100 % EX OIL: TOPICAL_OIL | CUTANEOUS | Status: DC | PRN

## 2021-08-11 MED ORDER — ORAL CARE MOUTH RINSE: 15.0000 mL | Freq: Once | OROMUCOSAL | Status: AC

## 2021-08-11 MED ORDER — CHLORHEXIDINE GLUCONATE 0.12 % MT SOLN
15.0000 mL | OROMUCOSAL | Status: AC
  Administered 2021-08-11: 15 mL via OROMUCOSAL
  Filled 2021-08-11 (×2): qty 15

## 2021-08-11 MED ORDER — HEPARIN 6000 UNIT IRRIGATION SOLUTION
Status: AC
  Filled 2021-08-11: qty 500

## 2021-08-11 SURGICAL SUPPLY — 57 items
ANGIOVAC CIRCUIT GEN3 (MISCELLANEOUS)
CANISTER SUCT 3000ML PPV (MISCELLANEOUS) ×1 IMPLANT
CANNULA C180 ANGIOVAC 180 DEG (CANNULA) IMPLANT
CANNULA C20 ANGIOVAC 20 DEG (CANNULA) IMPLANT
CANNULA OPTISITE PERFUSION 16F (CANNULA) IMPLANT
CANNULA OPTISITE PERFUSION 18F (CANNULA) IMPLANT
CANNULA OPTISITE PERFUSION 20F (CANNULA) IMPLANT
CATH ROBINSON RED A/P 18FR (CATHETERS) ×1 IMPLANT
CHLORAPREP W/TINT 26 (MISCELLANEOUS) ×1 IMPLANT
CLIP VESOCCLUDE MED 24/CT (CLIP) IMPLANT
CLIP VESOCCLUDE SM WIDE 24/CT (CLIP) IMPLANT
COVER PROBE W GEL 5X96 (DRAPES) ×1 IMPLANT
COVER SURGICAL LIGHT HANDLE (MISCELLANEOUS) ×1 IMPLANT
DERMABOND ADVANCED (GAUZE/BANDAGES/DRESSINGS)
DERMABOND ADVANCED .7 DNX12 (GAUZE/BANDAGES/DRESSINGS) IMPLANT
DRAPE C-ARM 42X72 X-RAY (DRAPES) IMPLANT
DRAPE INCISE IOBAN 66X45 STRL (DRAPES) ×1 IMPLANT
DRYSEAL FLEXSHEATH 18FR 33CM (SHEATH)
DRYSEAL FLEXSHEATH 26FR 33CM (SHEATH)
ELECT REM PT RETURN 9FT ADLT (ELECTROSURGICAL)
ELECTRODE REM PT RTRN 9FT ADLT (ELECTROSURGICAL) ×1 IMPLANT
FELT TEFLON 1X6 (MISCELLANEOUS) ×1 IMPLANT
GAUZE 4X4 16PLY ~~LOC~~+RFID DBL (SPONGE) ×1 IMPLANT
GLOVE BIOGEL PI IND STRL 6.5 (GLOVE) IMPLANT
GLOVE BIOGEL PI INDICATOR 6.5 (GLOVE)
GLOVE SURG ENC MOIS LTX SZ7 (GLOVE) ×1 IMPLANT
GOWN STRL REUS W/ TWL LRG LVL3 (GOWN DISPOSABLE) ×2 IMPLANT
GOWN STRL REUS W/ TWL XL LVL3 (GOWN DISPOSABLE) ×1 IMPLANT
GOWN STRL REUS W/TWL LRG LVL3 (GOWN DISPOSABLE)
GOWN STRL REUS W/TWL XL LVL3 (GOWN DISPOSABLE)
KIT BASIN OR (CUSTOM PROCEDURE TRAY) ×1 IMPLANT
KIT DILATOR VASC 18G NDL (KITS) ×1 IMPLANT
KIT TURNOVER KIT B (KITS) IMPLANT
NDL HYPO 25GX1X1/2 BEV (NEEDLE) IMPLANT
NEEDLE HYPO 25GX1X1/2 BEV (NEEDLE) IMPLANT
PACK CIRCUIT ANGIOVAC GEN3 (MISCELLANEOUS) IMPLANT
PACK ENDO MINOR (CUSTOM PROCEDURE TRAY) ×1 IMPLANT
PACK UNIVERSAL I (CUSTOM PROCEDURE TRAY) ×1 IMPLANT
PAD ARMBOARD 7.5X6 YLW CONV (MISCELLANEOUS) ×2 IMPLANT
POSITIONER HEAD DONUT 9IN (MISCELLANEOUS) ×1 IMPLANT
PUMP SARN DELFIN (MISCELLANEOUS) IMPLANT
SET MICROPUNCTURE 5F STIFF (MISCELLANEOUS) IMPLANT
SHEATH DRYSEAL FLEX 18FR 33CM (SHEATH) IMPLANT
SHEATH DRYSEAL FLEX 26FR 33CM (SHEATH) IMPLANT
SHEATH PINNACLE 8F 10CM (SHEATH) ×2 IMPLANT
SPONGE T-LAP 18X18 ~~LOC~~+RFID (SPONGE) ×5 IMPLANT
STOPCOCK 4 WAY LG BORE MALE ST (IV SETS) IMPLANT
SUT ETHIBOND X763 2 0 SH 1 (SUTURE) ×2 IMPLANT
SUT PROLENE 6 0 BV (SUTURE) IMPLANT
SUT SILK  1 MH (SUTURE)
SUT SILK 1 MH (SUTURE) ×2 IMPLANT
SYR CONTROL 10ML LL (SYRINGE) IMPLANT
TOWEL GREEN STERILE (TOWEL DISPOSABLE) ×2 IMPLANT
TRAY FOLEY SLVR 14FR TEMP STAT (SET/KITS/TRAYS/PACK) IMPLANT
TRAY FOLEY SLVR 16FR TEMP STAT (SET/KITS/TRAYS/PACK) IMPLANT
WATER STERILE IRR 1000ML POUR (IV SOLUTION) ×1 IMPLANT
WIRE AMPLATZ SS-J .035X180CM (WIRE) ×1 IMPLANT

## 2021-08-11 NOTE — Anesthesia Procedure Notes (Signed)
Arterial Line Insertion ?Start/End4/11/2021 12:08 PM ?Performed by: Janene Harvey, CRNA, CRNA ? Patient location: Pre-op. ?Lidocaine 1% used for infiltration ?Right, radial was placed ?Catheter size: 20 G ?Hand hygiene performed  and maximum sterile barriers used  ?Allen's test indicative of satisfactory collateral circulation ?Attempts: 1 ?Procedure performed without using ultrasound guided technique. ?Following insertion, dressing applied and Biopatch. ?Post procedure assessment: unchanged ? ?Patient tolerated the procedure well with no immediate complications. ? ? ?

## 2021-08-11 NOTE — Progress Notes (Signed)
Central line removed per MD order.  No complications to report at this time.  Pt is currently comfortable and not in pain.   ?

## 2021-08-11 NOTE — Transfer of Care (Signed)
Immediate Anesthesia Transfer of Care Note ? ?Patient: Latoya [name] ? ?Procedure(s) Performed: ABORTED APPLICATION OF ANGIOVAC ?TRANSESOPHAGEAL ECHOCARDIOGRAM (TEE) ? ?Patient Location: PACU ? ?Anesthesia Type:General ? ?Level of Consciousness: drowsy and patient cooperative ? ?Airway & Oxygen Therapy: Patient Spontanous Breathing and Patient connected to face mask oxygen ? ?Post-op Assessment: Report given to RN and Post -op Vital signs reviewed and stable ? ?Post vital signs: Reviewed and stable ? ?Last Vitals:  ?Vitals Value Taken Time  ?BP    ?Temp    ?Pulse    ?Resp    ?SpO2    ? ? ?Last Pain:  ?Vitals:  ? 08/11/21 1146  ?TempSrc: Oral  ?PainSc: 10-Worst pain ever  ?   ? ?Patients Stated Pain Goal: 0 (08/11/21 1012) ? ?Complications: No notable events documented. ?

## 2021-08-11 NOTE — Anesthesia Procedure Notes (Signed)
Central Venous Catheter Insertion ?Performed by: Joyelle Roberts, MD, anesthesiologist ?Start/End4/11/2021 12:14 PM, 08/11/2021 12:24 PM ?Patient location: Pre-op. ?Preanesthetic checklist: patient identified, IV checked, site marked, risks and benefits discussed, surgical consent, monitors and equipment checked, pre-op evaluation, timeout performed and anesthesia consent ?Position: Trendelenburg ?Lidocaine 1% used for infiltration and patient sedated ?Hand hygiene performed , maximum sterile barriers used  and Seldinger technique used ?Catheter size: 8 Fr ?Total catheter length 16. ?Central line was placed.Double lumen ?Procedure performed using ultrasound guided technique. ?Ultrasound Notes:anatomy identified, needle tip was noted to be adjacent to the nerve/plexus identified, no ultrasound evidence of intravascular and/or intraneural injection and image(s) printed for medical record ?Attempts: 1 ?Following insertion, dressing applied, line sutured and Biopatch. ?Post procedure assessment: blood return through all ports, free fluid flow and no air ? ?Patient tolerated the procedure well with no immediate complications. ? ? ? ? ?

## 2021-08-11 NOTE — Anesthesia Preprocedure Evaluation (Addendum)
Anesthesia Evaluation  ?Patient identified by MRN, date of birth, ID band ?Patient awake ? ? ? ?Reviewed: ?Allergy & Precautions, NPO status , Patient's Chart, lab work & pertinent test results ? ?History of Anesthesia Complications ?Negative for: history of anesthetic complications ? ?Airway ?Mallampati: II ? ?TM Distance: >3 FB ? ? ? ? Dental ?no notable dental hx. ?(+) Dental Advisory Given ?  ?Pulmonary ?Current Smoker and Patient abstained from smoking.,  ?  ?breath sounds clear to auscultation ? ? ? ? ? ? Cardiovascular ?negative cardio ROS ? ? ?Rhythm:Regular Rate:Normal ?+ Systolic murmurs ?IMPRESSIONS  ? ? ??1. Cannot R/O small oscillating density on TV; suggest TEE to further  ?assess.  ??2. Left ventricular ejection fraction, by estimation, is 60 to 65%. The  ?left ventricle has normal function. The left ventricle has no regional  ?wall motion abnormalities. Left ventricular diastolic parameters were  ?normal.  ??3. Right ventricular systolic function is normal. The right ventricular  ?size is normal. There is normal pulmonary artery systolic pressure.  ??4. The mitral valve is normal in structure. No evidence of mitral valve  ?regurgitation. No evidence of mitral stenosis.  ??5. The aortic valve is tricuspid. Aortic valve regurgitation is not  ?visualized. No aortic stenosis is present.  ??6. The inferior vena cava is normal in size with greater than 50%  ?respiratory variability, suggesting right atrial pressure of 3 mmHg.  ? ?Comparison(s): No prior Echocardiogram.  ?TV vegitation ?  ?Neuro/Psych ?  ? GI/Hepatic ?(+)  ?  ? substance abuse ? IV drug use,   ?Endo/Other  ? ? Renal/GU ?negative Renal ROS  ? ?  ?Musculoskeletal ? ?(+) Arthritis ,  ? Abdominal ?  ?Peds ? Hematology ? ?(+) Blood dyscrasia, anemia ,   ?Anesthesia Other Findings ? ? Reproductive/Obstetrics ? ?  ? ? ? ? ? ? ? ? ? ? ? ? ? ?  ?  ? ? ? ? ? ? ? ?Anesthesia Physical ? ?Anesthesia Plan ? ?ASA:  3 ? ?Anesthesia Plan: General  ? ?Post-op Pain Management:   ? ?Induction: Intravenous ? ?PONV Risk Score and Plan: 2 and Ondansetron, Dexamethasone and Midazolam ? ?Airway Management Planned: Nasal Cannula and Simple Face Mask ? ?Additional Equipment: Arterial line and CVP ? ?Intra-op Plan:  ? ?Post-operative Plan: Extubation in OR ? ?Informed Consent: I have reviewed the patients History and Physical, chart, labs and discussed the procedure including the risks, benefits and alternatives for the proposed anesthesia with the patient or authorized representative who has indicated his/her understanding and acceptance.  ? ? ? ?Dental advisory given ? ?Plan Discussed with: CRNA and Anesthesiologist ? ?Anesthesia Plan Comments:   ? ? ? ? ?Anesthesia Quick Evaluation ? ?

## 2021-08-11 NOTE — Anesthesia Procedure Notes (Signed)
Procedure Name: Intubation ?Date/Time: 08/11/2021 12:47 PM ?Performed by: Janene Harvey, CRNA ?Pre-anesthesia Checklist: Patient identified, Emergency Drugs available, Suction available and Patient being monitored ?Patient Re-evaluated:Patient Re-evaluated prior to induction ?Oxygen Delivery Method: Circle system utilized ?Preoxygenation: Pre-oxygenation with 100% oxygen ?Induction Type: IV induction ?Ventilation: Mask ventilation without difficulty ?Laryngoscope Size: Mac and 4 ?Grade View: Grade I ?Tube type: Oral ?Tube size: 7.5 mm ?Number of attempts: 1 ?Airway Equipment and Method: Stylet and Oral airway ?Placement Confirmation: ETT inserted through vocal cords under direct vision, positive ETCO2 and breath sounds checked- equal and bilateral ?Secured at: 22 cm ?Tube secured with: Tape ?Dental Injury: Teeth and Oropharynx as per pre-operative assessment  ? ? ? ? ?

## 2021-08-11 NOTE — Progress Notes (Signed)
?   ? ? ? ? ?[facility] for Infectious Disease ? ?Date of Admission:  08/02/2021   Total days of inpatient antibiotics 9 ? ?Principal Problem: ?  Endocarditis of tricuspid valve ?Active Problems: ?  Iron deficiency anemia ?  Opioid use disorder ?  IV drug abuse ([place]) ?  History of bacterial endocarditis ?  Hyponatremia ?     ?    ?Assessment: ?67 YF transferred from [facility] for MRSA tricuspid valve endocarditis. She is currently on vancomycin. Transferred from [facility] for angiovac.  ?#MRSA native TV endocarditis complicated with septic pulmonary emboli with severe MR ?#IVDA ?#Tobacco abuse ?-Hx of MSSA TV Ieo SP angiovac and treated with cefazolin ?-MRSA L4-L5 facet joint septic arthritis in 07/2019 ?-Hep B surface ag negative, HCV RNA negative, HIV negative ?-Angiovac on 4/7 ?Recommendations: ?-Continue vancomycin ?-Restart the clock on 6 weeks of antibiotics form Angiovac ? ?Microbiology:   ?Antibiotics: ?Vancomycin 3/29-p ?Cefepime 3/29-3/30 ?Metronidazole 3/29-3/30 ? ?Cultures: ?Blood ?3/21 1/1 MRSA ?4/2 1/2 staph epi ? ? ? ?SUBJECTIVE: ?Resting in bed. No new complaints ? ?Review of Systems: ?Review of Systems  ?All other systems reviewed and are negative. ? ? ?Scheduled Meds: ? benzonatate  100 mg Oral TID  ? Chlorhexidine Gluconate Cloth  6 each Topical Daily  ? DULoxetine  20 mg Oral Daily  ? enoxaparin (LOVENOX) injection  40 mg Subcutaneous Q24H  ? feeding supplement  1 Container Oral BID BM  ? feeding supplement  237 mL Oral Q24H  ? ferrous gluconate  324 mg Oral Q breakfast  ? magnesium oxide  400 mg Oral BID  ? multivitamin with minerals  1 tablet Oral Daily  ? nicotine  21 mg Transdermal Daily  ? pantoprazole  40 mg Oral Daily  ? thiamine  100 mg Oral Daily  ? ?Continuous Infusions: ? ferric gluconate (FERRLECIT) IVPB 250 mg (08/11/21 2013)  ? lactated ringers Stopped (08/11/21 1336)  ? vancomycin 750 mg (08/11/21 1824)  ? ?PRN Meds:.acetaminophen, albuterol, guaiFENesin-dextromethorphan, HYDROmorphone  (DILAUDID) injection, ketorolac, LORazepam, oxyCODONE ?Allergies  ?Allergen Reactions  ? Naltrexone Anxiety  ?  Severe anxiety, restlessness, vomiting  ? ? ?OBJECTIVE: ?Vitals:  ? 08/11/21 1720 08/11/21 1811 08/11/21 1816 08/11/21 1955  ?BP:   110/85 102/80  ?Pulse: (!) 103 (!) 103 (!) 105 (!) 103  ?Resp: 20 20 15 19   ?Temp:  98.2 ?F (36.8 ?C) 98.2 ?F (36.8 ?C) 98 ?F (36.7 ?C)  ?TempSrc:  Oral Oral Oral  ?SpO2: 95% 96% 97% 97%  ?Weight:      ?Height:      ? ?Body mass index is 31.14 kg/m?. ? ?Physical Exam ?Constitutional:   ?   Appearance: Normal appearance.  ?HENT:  ?   Head: Normocephalic and atraumatic.  ?   Right Ear: Tympanic membrane normal.  ?   Left Ear: Tympanic membrane normal.  ?   Nose: Nose normal.  ?   Mouth/Throat:  ?   Mouth: Mucous membranes are moist.  ?Eyes:  ?   Extraocular Movements: Extraocular movements intact.  ?   Conjunctiva/sclera: Conjunctivae normal.  ?   Pupils: Pupils are equal, round, and reactive to light.  ?Cardiovascular:  ?   Rate and Rhythm: Normal rate and regular rhythm.  ?   Heart sounds: No murmur heard. ?  No friction rub. No gallop.  ?Pulmonary:  ?   Effort: Pulmonary effort is normal.  ?   Breath sounds: Normal breath sounds.  ?Abdominal:  ?   General: Abdomen is  flat.  ?   Palpations: Abdomen is soft.  ?Musculoskeletal:     ?   General: Normal range of motion.  ?Skin: ?   General: Skin is warm and dry.  ?Neurological:  ?   General: No focal deficit present.  ?   Mental Status: She is alert and oriented to person, place, and time.  ?Psychiatric:     ?   Mood and Affect: Mood normal.  ? ? ? ? ?Lab Results ?Lab Results  ?Component Value Date  ? WBC 12.1 (H) 08/11/2021  ? HGB 8.5 (L) 08/11/2021  ? HCT 25.0 (L) 08/11/2021  ? MCV 74.6 (L) 08/11/2021  ? PLT 309 08/11/2021  ?  ?Lab Results  ?Component Value Date  ? CREATININE 0.40 (L) 08/11/2021  ? BUN 9 08/11/2021  ? NA 139 08/11/2021  ? K 4.2 08/11/2021  ? CL 105 08/11/2021  ? CO2 21 (L) 08/11/2021  ?  ?Lab Results  ?Component  Value Date  ? ALT 15 08/04/2021  ? AST 30 08/04/2021  ? ALKPHOS 60 08/04/2021  ? BILITOT 0.7 08/04/2021  ?  ? ? ? ? ?Laurice Record, MD ?[facility] for Infectious Disease ?[facility] ?08/11/2021, 9:39 PM  ?

## 2021-08-11 NOTE — Progress Notes (Signed)
?   ?  [address] ?      [place] [address] ?            [phone]      ? ?No events ? ?Vitals:  ? 08/10/21 2338 08/11/21 0542  ?BP: 104/80 105/77  ?Pulse: 95 95  ?Resp: 20 (!) 24  ?Temp: 98 ?F (36.7 ?C) 97.6 ?F (36.4 ?C)  ?SpO2: 95% 100%  ? ?Alert NAD ?Sinus ?EWOB ? ?OR today for angiovac ?Will obtain type and cross with line placement ? ?Corliss Skains ? ?

## 2021-08-11 NOTE — Op Note (Signed)
? ?   ?  [address] ?      [place] [address] ?            [phone]      ?  ?  ?03/04/2019 ?  ?Patient: Latoya [name] ?Pre-Op Dx:     Tricuspid valve endocarditis ?  MRSA bacteremia ?                        Sepsis ?                        Septic pulmonary emboli   ?Post-op Dx:  same ?Procedure: ?Aborted angio VAC procedure ?  ?Surgeon and Role:   ?   * Corliss Skains, MD - Primary ?   *D. Janeann Forehand - assisting ?Anesthesia  general ?EBL: 0 ml ?Blood Administration: 0 ? ?  ?Indications: ?The patient admitted to the hospital with tricuspid valve endocarditis and MRSA bacteremia.  Due to ongoing drug use, the patient was not a good surgical candidate for valve replacement.  The patient did however have a large tricuspid valve vegetation and evidence of multiple septic pulmonary emboli.  Catheter-based debridement of the tricuspid valve vegetation was recommended. ?  ?Findings: ?On preoperative transesophageal echocardiogram there was evidence of a PFO.  This is a contraindication for angio vac debridement.  The case was then aborted. ?  ?Operative Technique: ?After the risks, benefits and alternatives were thoroughly discussed, the patient was brought to the operative theatre.  Anesthesia was induced, and she was prepped and draped in normal sterile fashion.  An appropriate surgical pause was performed and preoperative antibiotics were dosed accordingly. ? ?The transesophageal echocardiogram revealed a positive bubble study concerning for patent foramen ovale.  On further investigation there was a connection between the right and left atrium with flows from the right to left side.  I decided to abort the case due to concern for an embolic stroke. ?  ?The patient was extubated without any immediate complications, and was transferred to the PACU in stable condition. ?  ?Corliss Skains ? ?

## 2021-08-11 NOTE — Progress Notes (Signed)
?PROGRESS NOTE ? ? ? ?Latoya [name]  FYB:[id number] DOB: Sep 20, 1985 DOA: 08/02/2021 ?PCP: Pcp, No  ?Narrative 35/F with history of IV heroin abuse, history of MSSA tricuspid valve endocarditis in 20/20 treated with Ancef and anterior checked at [facility], history of MRSA discitis/osteomyelitis in 2021 treated with antibiotics presented to the ED with low back pain, cough, fevers.  MRI LS spine negative for infection ?-Found to have MRSA bacteremia with tricuspid valve vegetation, septic pulmonary emboli ?-TEE 4/4 noted large highly mobile 1.8 cm tricuspid valve vegetation with severe TR ?-T CTS consulted, plan for angio [facility] 4/7, transferred to [facility] for surgery ? ? ?Subjective: ?-Feels weak, crummy, hurts all over, continues to have cough ? ?Assessment and Plan: ? ?MRSA tricuspid valve endocarditis ?Severe TR ?Severe sepsis, poa ?-Continue IV vancomycin ?-Repeat blood cultures 4/2 considered negative, staph epi likely contaminant ?-ID following ?-T CTS consulting, plan for angiovac today ? ?Cough, septic pulmonary emboli ?-Antibiotics as above, add guaifenesin, Tessalon Perles ? ?Hyponatremia ?-Improved, discontinued IV fluids ? ?Iron deficiency anemia ?Iron panel with serum iron of 22, transferrin saturation 7, ferritin 51 and TIBC 312. ?-Started IV iron ? ?IV drug abuse ([facility]) ?-Counseled again ? ?Opioid use disorder ?Continue pain control with oxycodone 5 mg p.o. q4h PRN moderate pain and Dilaudid 1 mg IV q2h PRN severe pain ? ?DVT prophylaxis: Lovenox ?Code Status: Full code ?Family Communication: Discussed with patient in detail, no family at bedside ?Disposition Plan: Home after antibiotic therapy completed ? ?Consultants:  ?T CTS, infectious disease ? ? ? ?Objective: ?Vitals:  ? 08/11/21 0542 08/11/21 0858 08/11/21 0900 08/11/21 1012  ?BP: 105/77 114/76    ?Pulse: 95 98  99  ?Resp: (!) 24 20  20   ?Temp: 97.6 ?F (36.4 ?C) 98 ?F (36.7 ?C) 98 ?F (36.7 ?C)   ?TempSrc: Oral Oral    ?SpO2: 100% 97%  98%   ?Weight:      ?Height:      ? ? ?Intake/Output Summary (Last 24 hours) at 08/11/2021 1119 ?Last data filed at 08/11/2021 0900 ?Gross per 24 hour  ?Intake 1200 ml  ?Output 4550 ml  ?Net -3350 ml  ? ?Filed Weights  ? 08/03/21 1036 08/08/21 1111 08/09/21 1607  ?Weight: 82.2 kg 82.2 kg 87.5 kg  ? ? ?Examination: ? ?General exam: Chronically ill-appearing female, AAOx3, flat affect ?CVS: S1-S2, regular rhythm, systolic murmur ?Lungs: Poor air movement bilaterally ?Abdomen: Soft, nontender, bowel sounds present ?Extremities: No edema, scabs on both legs ?Neuro: Moves all extremities, no localizing signs ?Psychiatry: Flat affect ? ? ?Data Reviewed:  ? ?CBC: ?Recent Labs  ?Lab 08/05/21 ?0425 08/06/21 ?10/06/21 08/07/21 ?10/07/21 08/08/21 ?10/08/21 08/09/21 ?10/09/21 08/10/21 ?10/10/21 08/11/21 ?10/11/21  ?WBC 17.4*   < > 15.5* 13.2* 13.8* 13.3* 12.1*  ?NEUTROABS 13.6*  --   --   --   --   --   --   ?HGB 8.5*   < > 7.4* 7.5* 7.9* 7.8* 8.7*  ?HCT 25.9*   < > 23.5* 23.5* 25.5* 25.0* 29.0*  ?MCV 70.2*   < > 72.8* 72.5* 73.5* 74.4* 74.6*  ?PLT 231   < > 297 322 378 370 309  ? < > = values in this interval not displayed.  ? ?Basic Metabolic Panel: ?Recent Labs  ?Lab 08/05/21 ?0425 08/06/21 ?10/06/21 08/07/21 ?10/07/21 08/08/21 ?10/08/21 08/09/21 ?10/09/21 08/10/21 ?10/10/21 08/11/21 ?10/11/21  ?NA 133*   < > 135 132* 133* 135 135  ?K 4.2   < > 4.4 4.3 4.1 4.3 4.2  ?  CL 105   < > 106 103 104 107 107  ?CO2 22   < > 24 23 23  21* 21*  ?GLUCOSE 113*   < > 109* 130* 120* 110* 117*  ?BUN 13   < > 17 13 19 10 11   ?CREATININE 0.63   < > 0.53 0.54 0.62 0.56 0.61  ?CALCIUM 7.6*   < > 7.7* 7.8* 7.8* 8.1* 8.4*  ?MG 2.1  --   --  2.3 2.3  --   --   ? < > = values in this interval not displayed.  ? ?GFR: ?Estimated Creatinine Clearance: 110.4 mL/min (by C-G formula based on SCr of 0.61 mg/dL). ?Liver Function Tests: ?No results for input(s): AST, ALT, ALKPHOS, BILITOT, PROT, ALBUMIN in the last 168 hours. ? ?No results for input(s): LIPASE, AMYLASE in the last 168 hours. ?No results for  input(s): AMMONIA in the last 168 hours. ?Coagulation Profile: ?No results for input(s): INR, PROTIME in the last 168 hours. ?Cardiac Enzymes: ?No results for input(s): CKTOTAL, CKMB, CKMBINDEX, TROPONINI in the last 168 hours. ?BNP (last 3 results) ?No results for input(s): PROBNP in the last 8760 hours. ?HbA1C: ?No results for input(s): HGBA1C in the last 72 hours. ?CBG: ?No results for input(s): GLUCAP in the last 168 hours. ?Lipid Profile: ?No results for input(s): CHOL, HDL, LDLCALC, TRIG, CHOLHDL, LDLDIRECT in the last 72 hours. ?Thyroid Function Tests: ?No results for input(s): TSH, T4TOTAL, FREET4, T3FREE, THYROIDAB in the last 72 hours. ?Anemia Panel: ?No results for input(s): VITAMINB12, FOLATE, FERRITIN, TIBC, IRON, RETICCTPCT in the last 72 hours. ?Urine analysis: ?   ?Component Value Date/Time  ? COLORURINE YELLOW 08/02/2021 2028  ? APPEARANCEUR HAZY (A) 08/02/2021 2028  ? LABSPEC 1.012 08/02/2021 2028  ? PHURINE 6.0 08/02/2021 2028  ? GLUCOSEU NEGATIVE 08/02/2021 2028  ? HGBUR SMALL (A) 08/02/2021 2028  ? BILIRUBINUR NEGATIVE 08/02/2021 2028  ? BILIRUBINUR negative 06/21/2021 1755  ? KETONESUR NEGATIVE 08/02/2021 2028  ? PROTEINUR 30 (A) 08/02/2021 2028  ? UROBILINOGEN 0.2 06/21/2021 1755  ? NITRITE NEGATIVE 08/02/2021 2028  ? LEUKOCYTESUR SMALL (A) 08/02/2021 2028  ? ?Sepsis Labs: ?@LABRCNTIP (procalcitonin:4,lacticidven:4) ? ?) ?Recent Results (from the past 240 hour(s))  ?Blood culture (routine x 2)     Status: Abnormal  ? Collection Time: 08/02/21  2:40 PM  ? Specimen: BLOOD  ?Result Value Ref Range Status  ? Specimen Description   Final  ?  BLOOD LEFT ANTECUBITAL ?Performed at [facility], [address], [place], [place] ?  ? Special Requests   Final  ?  BOTTLES DRAWN AEROBIC AND ANAEROBIC Blood Culture results may not be optimal due to an excessive volume of blood received in culture bottles ?Performed at [facility], [address],  [place], [place] ?  ? Culture  Setup Time   Final  ?  GRAM POSITIVE COCCI IN CLUSTERS ?IN BOTH AEROBIC AND ANAEROBIC BOTTLES ?CRITICAL RESULT CALLED TO, READ BACK BY AND VERIFIED WITH: PHARMD TERRY GREEN 08/03/21 @ 9:31 BY DRT ?Performed at [facility], [address] [place], [facility] [address] ?  ? Culture METHICILLIN RESISTANT STAPHYLOCOCCUS AUREUS (A)  Final  ? Report Status 08/05/2021 FINAL  Final  ? Organism ID, Bacteria METHICILLIN RESISTANT STAPHYLOCOCCUS AUREUS  Final  ?    Susceptibility  ? Methicillin resistant staphylococcus aureus - MIC*  ?  CIPROFLOXACIN >=8 RESISTANT Resistant   ?  ERYTHROMYCIN >=8 RESISTANT Resistant   ?  GENTAMICIN <=0.5  SENSITIVE Sensitive   ?  OXACILLIN >=4 RESISTANT Resistant   ?  TETRACYCLINE >=16 RESISTANT Resistant   ?  VANCOMYCIN 1 SENSITIVE Sensitive   ?  TRIMETH/SULFA >=320 RESISTANT Resistant   ?  CLINDAMYCIN <=0.25 SENSITIVE Sensitive   ?  RIFAMPIN <=0.5 SENSITIVE Sensitive   ?  Inducible Clindamycin NEGATIVE Sensitive   ?  * METHICILLIN RESISTANT STAPHYLOCOCCUS AUREUS  ?Blood Culture ID Panel (Reflexed)     Status: Abnormal  ? Collection Time: 08/02/21  2:40 PM  ?Result Value Ref Range Status  ? Enterococcus faecalis NOT DETECTED NOT DETECTED Final  ? Enterococcus Faecium NOT DETECTED NOT DETECTED Final  ? Listeria monocytogenes NOT DETECTED NOT DETECTED Final  ? Staphylococcus species DETECTED (A) NOT DETECTED Final  ?  Comment: CRITICAL RESULT CALLED TO, READ BACK BY AND VERIFIED WITH: ?PHARMD TERRY GREEN 08/03/21 @ 9:32 BY DRT ?  ? Staphylococcus aureus (BCID) DETECTED (A) NOT DETECTED Final  ?  Comment: Methicillin (oxacillin)-resistant Staphylococcus aureus (MRSA). MRSA is predictably resistant to beta-lactam antibiotics (except ceftaroline). Preferred therapy is vancomycin unless clinically contraindicated. Patient requires contact precautions if  ?hospitalized. ?CRITICAL RESULT CALLED TO, READ BACK BY AND VERIFIED WITH: ?PHARMD TERRY GREEN 08/03/21 @ 9:32  BY DRT ?  ? Staphylococcus epidermidis NOT DETECTED NOT DETECTED Final  ? Staphylococcus lugdunensis NOT DETECTED NOT DETECTED Final  ? Streptococcus species NOT DETECTED NOT DETECTED Final  ? Streptococcus agalactiae N

## 2021-08-11 NOTE — Progress Notes (Signed)
OT Cancellation Note ? ?Patient Details ?Name: Latoya [name] ?MRN: [id number] ?DOB: 1985/10/28 ? ? ?Cancelled Treatment:    Reason Eval/Treat Not Completed: Other (comment) (sleeping per RN, pending surgery 12 today) ? ?Latoya [name] ?08/11/2021, 8:21 AM ?

## 2021-08-11 NOTE — Progress Notes (Signed)
?  Echocardiogram ?Echocardiogram Transesophageal has been performed. ? ?Latoya [name] ?08/11/2021, 1:31 PM ?

## 2021-08-12 ENCOUNTER — Encounter (HOSPITAL_COMMUNITY): Payer: Self-pay | Admitting: Thoracic Surgery (Cardiothoracic Vascular Surgery)

## 2021-08-12 DIAGNOSIS — I079 Rheumatic tricuspid valve disease, unspecified: Secondary | ICD-10-CM | POA: Diagnosis not present

## 2021-08-12 LAB — BASIC METABOLIC PANEL
Anion gap: 7 (ref 5–15)
BUN: 10 mg/dL (ref 6–20)
CO2: 23 mmol/L (ref 22–32)
Calcium: 9 mg/dL (ref 8.9–10.3)
Chloride: 107 mmol/L (ref 98–111)
Creatinine, Ser: 0.66 mg/dL (ref 0.44–1.00)
GFR, Estimated: >60 mL/min (ref >60)
Glucose, Bld: 130 mg/dL — ABNORMAL HIGH (ref 70–99)
Potassium: 4.2 mmol/L (ref 3.5–5.1)
Sodium: 137 mmol/L (ref 135–145)

## 2021-08-12 LAB — CBC
HCT: 31.3 % — ABNORMAL LOW (ref 36.0–46.0)
Hemoglobin: 9.2 g/dL — ABNORMAL LOW (ref 12.0–15.0)
MCH: 22.6 pg — ABNORMAL LOW (ref 26.0–34.0)
MCHC: 29.4 g/dL — ABNORMAL LOW (ref 30.0–36.0)
MCV: 76.9 fL — ABNORMAL LOW (ref 80.0–100.0)
Platelets: 407 10*3/uL — ABNORMAL HIGH (ref 150–400)
RBC: 4.07 MIL/uL (ref 3.87–5.11)
RDW: 21.8 % — ABNORMAL HIGH (ref 11.5–15.5)
WBC: 15.3 10*3/uL — ABNORMAL HIGH (ref 4.0–10.5)
nRBC: 0 % (ref 0.0–0.2)

## 2021-08-12 LAB — POCT ACTIVATED CLOTTING TIME: Activated Clotting Time: 149 seconds

## 2021-08-12 MED ORDER — MENTHOL 3 MG MT LOZG
1.0000 | LOZENGE | OROMUCOSAL | Status: DC | PRN
  Administered 2021-08-12 – 2021-09-17 (×9): 3 mg via ORAL
  Filled 2021-08-12 (×3): qty 9

## 2021-08-12 NOTE — Progress Notes (Signed)
Pharmacy Antibiotic Note ? ?Latoya [name] is a 36 y.o. female admitted on 08/02/2021 with MRSA bacteremia- possible septic pulmonary emboli/endocarditis. Pharmacy has been consulted for Vancomycin dosing. Scr is at baseline of 0.4. with estimated CrCl > 100 ml/min.  ? ?Most recent Vancomycin levels showed a peak of 38 and a trough of 14, calculated AUC 697 which is supratherapeutic. Dose adjusted. Angiovac could not be performed due to PFO. Will follow ID plans for duration as initially intended 6 weeks from angiovac. ? ? ? ?Plan: ?- Continue Vancomycin to 750 mg every 8 hours - Predicted AUC 522 with Cmin 11.5 ?- Will continue to follow renal function, culture results, LOT, and antibiotic plans ? ?Height: 5\' 6"  (167.6 cm) ?Weight: 87.5 kg (192 lb 14.4 oz) ?IBW/kg (Calculated) : 59.3 ? ?Temp (24hrs), Avg:98.1 ?F (36.7 ?C), Min:97.8 ?F (36.6 ?C), Max:98.6 ?F (37 ?C) ? ?Recent Labs  ?Lab 08/07/21 ?0354 08/07/21 ?2226 08/08/21 ?10/08/21 08/08/21 ?10/08/21 08/09/21 ?10/09/21 08/10/21 ?10/10/21 08/11/21 ?10/11/21 08/11/21 ?1251  ?WBC 15.5*  --   --  13.2* 13.8* 13.3* 12.1*  --   ?CREATININE 0.53  --   --  0.54 0.62 0.56 0.61 0.40*  ?VANCOTROUGH  --   --  14*  --   --   --   --   --   ?VANCOPEAK  --  38  --   --   --   --   --   --   ? ?  ?Estimated Creatinine Clearance: 110.4 mL/min (A) (by C-G formula based on SCr of 0.4 mg/dL (L)).   ? ?Allergies  ?Allergen Reactions  ? Naltrexone Anxiety  ?  Severe anxiety, restlessness, vomiting  ? ? ?Antimicrobials this admission: ?Cefepime 3/29 >> 3/30 ?Flagyl 3/29 >> 3/30 ?Vancomycin 3/29 >> ? ?Dose adjustments this admission: ?4/3-4/4 VP 38 VT 14- AUC 697 >>Adj to 750 q 8 hours  ? ?Microbiology results: ?3/29 Influenza/COVID>> neg ?3/29 MRSA PCR >> positive ?3/29 BCx >> GPC in 4/4 bottles (BCID MRSA) ?3/31 BCX: MRSA - only 1 set drawn  ?4/2 BCx: NG ? ?Thank you for allowing pharmacy to participate in this patient's care. ? ?4/31, PharmD ?PGY1 Pharmacy Resident ?08/12/2021 9:32 AM ?Check  [email or web address] for unit specific pharmacy number ? ? ? ? ?

## 2021-08-12 NOTE — Progress Notes (Signed)
Mobility Specialist Progress Note ? ? 08/12/21 1300  ?Mobility  ?Activity Ambulated with assistance in room;Transferred from bed to chair  ?Level of Assistance Contact guard assist, steadying assist  ?Assistive Device Front wheel walker  ?Distance Ambulated (ft) 22 ft  ?Activity Response Tolerated well  ?$Mobility charge 1 Mobility  ? ?Pre Mobility: 94 HR, 94% SpO2 on RA  ?During Mobility: 112 HR, 88% SpO2 on RA ?Post Mobility: 104 HR, 114/85 BP, 99% SpO2 on RA ? ?Received in bed c/o slight back pain and discomfort from where the central line was pulled but agreeable. No physical assist needed throughout but pt needing inc time d/t anxiety. Pt presenting w/ gen BLE weakness but able to tx to chair w/o fault after ambulating in room. Left call bell in reach and notified RN about session.   ? ?Holland Falling ?Mobility Specialist ?Phone Number [phone] ? ?

## 2021-08-12 NOTE — Anesthesia Postprocedure Evaluation (Signed)
Anesthesia Post Note ? ?Patient: Latoya [name] ? ?Procedure(s) Performed: [facility] ?TRANSESOPHAGEAL ECHOCARDIOGRAM (TEE) ? ?  ? ?Patient location during evaluation: PACU ?Anesthesia Type: General ?Level of consciousness: sedated ?Pain management: pain level controlled ?Vital Signs Assessment: post-procedure vital signs reviewed and stable ?Respiratory status: spontaneous breathing and respiratory function stable ?Cardiovascular status: stable ?Postop Assessment: no apparent nausea or vomiting ?Anesthetic complications: no ? ? ?No notable events documented. ? ?  ?  ?  ?  ? ?Jaina Morin DANIEL ? ? ? ? ?

## 2021-08-12 NOTE — Progress Notes (Signed)
?PROGRESS NOTE ? ? ? ?Latoya [name]  [id number] DOB: Oct 09, 1985 DOA: 08/02/2021 ?PCP: Pcp, No  ?Narrative 35/F with history of IV heroin abuse, history of MSSA tricuspid valve endocarditis in 20/20 treated with Ancef and anterior checked at [facility], history of MRSA discitis/osteomyelitis in 2021 treated with antibiotics presented to the ED with low back pain, cough, fevers.  MRI LS spine negative for infection ?-Found to have MRSA bacteremia with tricuspid valve vegetation, septic pulmonary emboli ?-TEE 4/4 noted large highly mobile 1.8 cm tricuspid valve vegetation with severe TR ?-TCTS consulted, plan for angio [facility] 4/7, transferred to [facility] for surgery ?Unable to have angioVAC due to PFO ? ? ?Subjective: ?C/o back pain ? ?Assessment and Plan: ? ?MRSA tricuspid valve endocarditis ?Severe TR ?Severe sepsis, poa ?-Continue IV vancomycin ?-Repeat blood cultures 4/2 considered negative, staph epi likely contaminant ?-ID following ?-TCTS consulting, unable to have angiovac ?-will need 6 weeks of abx ? ?Cough, septic pulmonary emboli ?-Antibiotics as above, add guaifenesin, Tessalon Perles ? ?Hyponatremia ?-Improved ? ?Iron deficiency anemia ?Iron panel with serum iron of 22, transferrin saturation 7, ferritin 51 and TIBC 312. ? ?IV drug abuse (HCC) ?-Counseled again ? ?Opioid use disorder ?Continue pain control with oxycodone 5 mg p.o. q4h PRN moderate pain and Dilaudid 1 mg IV q2h PRN severe pain ? ?DVT prophylaxis: Lovenox ?Code Status: Full code ?Family Communication: Discussed with patient in detail, no family at bedside ?Disposition Plan: Home after antibiotic therapy completed ? ?Consultants:  ?T CTS, infectious disease ? ? ? ?Objective: ?Vitals:  ? 08/12/21 0946 08/12/21 1148 08/12/21 1157 08/12/21 1307  ?BP:  114/82    ?Pulse: 97 (!) 107 (!) 105 (!) 104  ?Resp: 20 17 20    ?Temp:  98.2 ?F (36.8 ?C)    ?TempSrc:  Oral    ?SpO2: 95% 97% 97% 100%  ?Weight:      ?Height:      ? ? ?Intake/Output Summary  (Last 24 hours) at 08/12/2021 1343 ?Last data filed at 08/12/2021 1100 ?Gross per 24 hour  ?Intake 1340 ml  ?Output 2300 ml  ?Net -960 ml  ? ?Filed Weights  ? 08/03/21 1036 08/08/21 1111 08/09/21 1607  ?Weight: 82.2 kg 82.2 kg 87.5 kg  ? ? ?Examination: ? ? ?General: Appearance:    Obese female in no acute distress  ?   ?Lungs:      respirations unlabored  ?Heart:    Tachycardic.   ?MS:   All extremities are intact.  ?  ?Neurologic:   Awake, alert, oriented x 3  ?  ? ? ?Data Reviewed:  ? ?CBC: ?Recent Labs  ?Lab 08/08/21 ?0434 08/09/21 ?0659 08/10/21 ?0744 08/11/21 ?10/11/21 08/11/21 ?1251 08/12/21 ?1100  ?WBC 13.2* 13.8* 13.3* 12.1*  --  15.3*  ?HGB 7.5* 7.9* 7.8* 8.7* 8.5* 9.2*  ?HCT 23.5* 25.5* 25.0* 29.0* 25.0* 31.3*  ?MCV 72.5* 73.5* 74.4* 74.6*  --  76.9*  ?PLT 322 378 370 309  --  407*  ? ?Basic Metabolic Panel: ?Recent Labs  ?Lab 08/08/21 ?0434 08/09/21 ?0659 08/10/21 ?0744 08/11/21 ?10/11/21 08/11/21 ?1251 08/12/21 ?1100  ?NA 132* 133* 135 135 139 137  ?K 4.3 4.1 4.3 4.2 4.2 4.2  ?CL 103 104 107 107 105 107  ?CO2 23 23 21* 21*  --  23  ?GLUCOSE 130* 120* 110* 117* 100* 130*  ?BUN 13 19 10 11 9 10   ?CREATININE 0.54 0.62 0.56 0.61 0.40* 0.66  ?CALCIUM 7.8* 7.8* 8.1* 8.4*  --  9.0  ?  MG 2.3 2.3  --   --   --   --   ? ?GFR: ?Estimated Creatinine Clearance: 110.4 mL/min (by C-G formula based on SCr of 0.66 mg/dL). ?Liver Function Tests: ?No results for input(s): AST, ALT, ALKPHOS, BILITOT, PROT, ALBUMIN in the last 168 hours. ? ?No results for input(s): LIPASE, AMYLASE in the last 168 hours. ?No results for input(s): AMMONIA in the last 168 hours. ?Coagulation Profile: ?No results for input(s): INR, PROTIME in the last 168 hours. ?Cardiac Enzymes: ?No results for input(s): CKTOTAL, CKMB, CKMBINDEX, TROPONINI in the last 168 hours. ?BNP (last 3 results) ?No results for input(s): PROBNP in the last 8760 hours. ?HbA1C: ?No results for input(s): HGBA1C in the last 72 hours. ?CBG: ?No results for input(s): GLUCAP in the last 168  hours. ?Lipid Profile: ?No results for input(s): CHOL, HDL, LDLCALC, TRIG, CHOLHDL, LDLDIRECT in the last 72 hours. ?Thyroid Function Tests: ?No results for input(s): TSH, T4TOTAL, FREET4, T3FREE, THYROIDAB in the last 72 hours. ?Anemia Panel: ?No results for input(s): VITAMINB12, FOLATE, FERRITIN, TIBC, IRON, RETICCTPCT in the last 72 hours. ?Urine analysis: ?   ?Component Value Date/Time  ? COLORURINE YELLOW 08/02/2021 2028  ? APPEARANCEUR HAZY (A) 08/02/2021 2028  ? LABSPEC 1.012 08/02/2021 2028  ? PHURINE 6.0 08/02/2021 2028  ? GLUCOSEU NEGATIVE 08/02/2021 2028  ? HGBUR SMALL (A) 08/02/2021 2028  ? BILIRUBINUR NEGATIVE 08/02/2021 2028  ? BILIRUBINUR negative 06/21/2021 1755  ? KETONESUR NEGATIVE 08/02/2021 2028  ? PROTEINUR 30 (A) 08/02/2021 2028  ? UROBILINOGEN 0.2 06/21/2021 1755  ? NITRITE NEGATIVE 08/02/2021 2028  ? LEUKOCYTESUR SMALL (A) 08/02/2021 2028  ? ? ? ? ?Recent Results (from the past 240 hour(s))  ?Blood culture (routine x 2)     Status: Abnormal  ? Collection Time: 08/02/21  2:40 PM  ? Specimen: BLOOD  ?Result Value Ref Range Status  ? Specimen Description   Final  ?  BLOOD LEFT ANTECUBITAL ?Performed at [facility], [address] [place], [place] [address] ?  ? Special Requests   Final  ?  BOTTLES DRAWN AEROBIC AND ANAEROBIC Blood Culture results may not be optimal due to an excessive volume of blood received in culture bottles ?Performed at [facility], [address] [place], [place] [address] ?  ? Culture  Setup Time   Final  ?  GRAM POSITIVE COCCI IN CLUSTERS ?IN BOTH AEROBIC AND ANAEROBIC BOTTLES ?CRITICAL RESULT CALLED TO, READ BACK BY AND VERIFIED WITH: PHARMD TERRY GREEN 08/03/21 @ 9:31 BY DRT ?Performed at [facility], [address] [place], [place] [address] ?  ? Culture METHICILLIN RESISTANT STAPHYLOCOCCUS AUREUS (A)  Final  ? Report Status 08/05/2021 FINAL  Final  ? Organism ID, Bacteria METHICILLIN RESISTANT STAPHYLOCOCCUS AUREUS   Final  ?    Susceptibility  ? Methicillin resistant staphylococcus aureus - MIC*  ?  CIPROFLOXACIN >=8 RESISTANT Resistant   ?  ERYTHROMYCIN >=8 RESISTANT Resistant   ?  GENTAMICIN <=0.5 SENSITIVE Sensitive   ?  OXACILLIN >=4 RESISTANT Resistant   ?  TETRACYCLINE >=16 RESISTANT Resistant   ?  VANCOMYCIN 1 SENSITIVE Sensitive   ?  TRIMETH/SULFA >=320 RESISTANT Resistant   ?  CLINDAMYCIN <=0.25 SENSITIVE Sensitive   ?  RIFAMPIN <=0.5 SENSITIVE Sensitive   ?  Inducible Clindamycin NEGATIVE Sensitive   ?  * METHICILLIN RESISTANT STAPHYLOCOCCUS AUREUS  ?Blood Culture ID Panel (Reflexed)     Status: Abnormal  ? Collection Time: 08/02/21  2:40  PM  ?Result Value Ref Range Status  ? Enterococcus faecalis NOT DETECTED NOT DETECTED Final  ? Enterococcus Faecium NOT DETECTED NOT DETECTED Final  ? Listeria monocytogenes NOT DETECTED NOT DETECTED Final  ? Staphylococcus species DETECTED (A) NOT DETECTED Final  ?  Comment: CRITICAL RESULT CALLED TO, READ BACK BY AND VERIFIED WITH: ?PHARMD TERRY GREEN 08/03/21 @ 9:32 BY DRT ?  ? Staphylococcus aureus (BCID) DETECTED (A) NOT DETECTED Final  ?  Comment: Methicillin (oxacillin)-resistant Staphylococcus aureus (MRSA). MRSA is predictably resistant to beta-lactam antibiotics (except ceftaroline). Preferred therapy is vancomycin unless clinically contraindicated. Patient requires contact precautions if  ?hospitalized. ?CRITICAL RESULT CALLED TO, READ BACK BY AND VERIFIED WITH: ?PHARMD TERRY GREEN 08/03/21 @ 9:32 BY DRT ?  ? Staphylococcus epidermidis NOT DETECTED NOT DETECTED Final  ? Staphylococcus lugdunensis NOT DETECTED NOT DETECTED Final  ? Streptococcus species NOT DETECTED NOT DETECTED Final  ? Streptococcus agalactiae NOT DETECTED NOT DETECTED Final  ? Streptococcus pneumoniae NOT DETECTED NOT DETECTED Final  ? Streptococcus pyogenes NOT DETECTED NOT DETECTED Final  ? A.calcoaceticus-baumannii NOT DETECTED NOT DETECTED Final  ? Bacteroides fragilis NOT DETECTED NOT DETECTED Final   ? Enterobacterales NOT DETECTED NOT DETECTED Final  ? Enterobacter cloacae complex NOT DETECTED NOT DETECTED Final  ? Escherichia coli NOT DETECTED NOT DETECTED Final  ? Klebsiella aerogenes NOT DETECTE

## 2021-08-12 NOTE — Progress Notes (Signed)
Occupational Therapy Treatment ?Patient Details ?Name: Latoya [name] ?MRN: [id number] ?DOB: [birth year] ?Today's Date: 08/12/2021 ? ? ?History of present illness Latoya [name] is a 36 y.o. female admitted with back pain, hypotension, hyponatremia, leukocytosis and neutrophilia. Found to have bacteremia possible sepitc pulmonary emboli from endocarditis. Plan for OR for angio vac debridement on 4/7. PMH: IVDU, heroin abuse, ongoing. ?  ?OT comments ? Pt at this time decline any OOB activity but was able to complete hair care and application of deodorant while sitting at EOB. Pt reported when going from supine to sitting with min assist, HOB elevated, bed rail and premedicated prior to session reported pain of 8/10. Pt reported once at EOB felt more comfortable then in bed. Pt currently with functional limitations due to the deficits listed below (see OT Problem List).  Pt will benefit from skilled OT to increase their safety and independence with ADL and functional mobility for ADL to facilitate discharge to venue listed below.  ?  ? ?Recommendations for follow up therapy are one component of a multi-disciplinary discharge planning process, led by the attending physician.  Recommendations may be updated based on patient status, additional functional criteria and insurance authorization. ?   ?Follow Up Recommendations ? Acute inpatient rehab (3hours/day)  ?  ?Assistance Recommended at Discharge Frequent or constant Supervision/Assistance  ?Patient can return home with the following ? A lot of help with bathing/dressing/bathroom;Two people to help with walking and/or transfers;Assistance with cooking/housework;Direct supervision/assist for medications management;Assist for transportation;Help with stairs or ramp for entrance;Direct supervision/assist for financial management ?  ?Equipment Recommendations ? Other (comment)  ?  ?Recommendations for Other Services   ? ?  ?Precautions / Restrictions Precautions ?Precautions:  Fall ?Precaution Comments: Contact precs, monitor HR, pain management ?Restrictions ?Weight Bearing Restrictions: No  ? ? ?  ? ?Mobility Bed Mobility ?Overal bed mobility: Needs Assistance ?Bed Mobility: Supine to Sit ?Rolling: Min guard ?  ?Supine to sit: Min assist, HOB elevated ?  ?  ?  ?  ? ?Transfers ?Overall transfer level:  (Pt declined to complete any OOB activity) ?  ?  ?  ?Stand pivot transfers: Min guard (to [facility] 3 trials) ?  ?  ?  ? Lateral/Scoot Transfers: Min guard ?  ?  ?  ?Balance Overall balance assessment: Needs assistance ?Sitting-balance support: Feet unsupported ?Sitting balance-Leahy Scale: Good ?Sitting balance - Comments: able to sit with no BUE and no postural chnage ?  ?  ?  ?  ?  ?  ?  ?  ?  ?  ?  ?  ?  ?  ?  ?   ? ?ADL either performed or assessed with clinical judgement  ? ?ADL Overall ADL's : Needs assistance/impaired ?Eating/Feeding: Bed level;Set up ?  ?Grooming: Applying deodorant;Brushing hair;Set up;Sitting ?  ?Upper Body Bathing: Minimal assistance;Sitting ?  ?  ?  ?  ?  ?  ?  ?  ?  ?  ?  ?  ?  ?  ?General ADL Comments: Pt decline any further OOB acitity or transfer at this time. Pt reported decrease in pain when sitting at EOB and wanted to continue to eat while sitting upright. ?  ? ?Extremity/Trunk Assessment Upper Extremity Assessment ?Upper Extremity Assessment: RUE deficits/detail;LUE deficits/detail ?RUE Deficits / Details: Pt noted to be able to complete two self care tasks while sitting at EOB and came to about 90 degrees but fearful with any MMT ?LUE Deficits / Details: Pt noted to be able to complete  two self care tasks while sitting at EOB and came to about 90 degrees but fearful with any MMT ?  ?Lower Extremity Assessment ?Lower Extremity Assessment: Defer to PT evaluation ?  ?  ?  ? ?Vision   ?  ?  ?Perception   ?  ?Praxis   ?  ? ?Cognition Arousal/Alertness: Awake/alert ?Behavior During Therapy: Anxious ?Overall Cognitive Status: No family/caregiver present to  determine baseline cognitive functioning ?Area of Impairment: Safety/judgement ?  ?  ?  ?  ?  ?  ?  ?  ?  ?  ?Memory: Decreased short-term memory ?  ?Safety/Judgement: Decreased awareness of safety, Decreased awareness of deficits ?  ?Problem Solving: Slow processing ?General Comments: Pt noted very fearful with any movement and was premedicated prior to treatment ?  ?  ?   ?Exercises   ? ?  ?Shoulder Instructions   ? ? ?  ?General Comments [id number] with ADLS  ? ? ?Pertinent Vitals/ Pain       Pain Assessment ?Pain Assessment: 0-10 ?Pain Score: 8  ?Breathing: normal ?Negative Vocalization: none ?Facial Expression: smiling or inexpressive ?Body Language: relaxed ?Consolability: no need to console ?PAINAD Score: 0 ?Pain Location: lower back ?Pain Descriptors / Indicators: Discomfort, Grimacing, Guarding ?Pain Intervention(s): Premedicated before session ? ?Home Living   ?  ?  ?  ?  ?  ?  ?  ?  ?  ?  ?  ?  ?  ?  ?  ?  ?  ?  ? ?  ?Prior Functioning/Environment    ?  ?  ?  ?   ? ?Frequency ? Min 2X/week  ? ? ? ? ?  ?Progress Toward Goals ? ?OT Goals(current goals can now be found in the care plan section) ? Progress towards OT goals: Progressing toward goals ? ?Acute Rehab OT Goals ?Patient Stated Goal: to have less pain ?OT Goal Formulation: With patient ?Time For Goal Achievement: 08/20/21 ?Potential to Achieve Goals: Good ?ADL Goals ?Pt Will Perform Grooming: sitting;with modified independence ?Pt Will Perform Lower Body Dressing: with modified independence;sitting/lateral leans;sit to/from stand ?Pt Will Transfer to Toilet: with modified independence;ambulating;regular height toilet ?Pt Will Perform Toileting - Clothing Manipulation and hygiene: with modified independence;with adaptive equipment;sitting/lateral leans;sit to/from stand  ?Plan Discharge plan remains appropriate   ? ?Co-evaluation ? ? ?   ?  ?  ?  ?  ? ?  ?AM-PAC OT "6 Clicks" Daily Activity     ?Outcome Measure ? ? Help from another person eating  meals?: A Little ?Help from another person taking care of personal grooming?: A Little ?Help from another person toileting, which includes using toliet, bedpan, or urinal?: Total ?Help from another person bathing (including washing, rinsing, drying)?: Total ?Help from another person to put on and taking off regular upper body clothing?: Total ?Help from another person to put on and taking off regular lower body clothing?: Total ?6 Click Score: 10 ? ?  ?End of Session Equipment Utilized During Treatment: Gait belt ? ?OT Visit Diagnosis: Muscle weakness (generalized) (M62.81);Pain ?  ?Activity Tolerance Patient limited by pain ?  ?Patient Left in bed;with call bell/phone within reach;with bed alarm set ?  ?Nurse Communication Mobility status (current set up) ?  ? ?   ? ?Time: [id number] ?OT Time Calculation (min): 26 min ? ?Charges: OT General Charges ?$OT Visit: 1 Visit ?OT Treatments ?$Self Care/Home Management : 23-37 mins ? ?Joeseph Amor OTR/L  ?Acute Rehab Services  ?[phone] office number ?[phone]  pager number ? ? ?Joeseph Amor ?08/12/2021, 2:58 PM ?

## 2021-08-13 DIAGNOSIS — I079 Rheumatic tricuspid valve disease, unspecified: Secondary | ICD-10-CM | POA: Diagnosis not present

## 2021-08-13 MED ORDER — RIVAROXABAN 10 MG PO TABS
10.0000 mg | ORAL_TABLET | Freq: Every day | ORAL | Status: DC
  Administered 2021-08-13 – 2021-09-12 (×31): 10 mg via ORAL
  Filled 2021-08-13 (×31): qty 1

## 2021-08-13 MED ORDER — IBUPROFEN 600 MG PO TABS
600.0000 mg | ORAL_TABLET | Freq: Four times a day (QID) | ORAL | Status: DC | PRN
  Administered 2021-08-13 – 2021-08-14 (×3): 600 mg via ORAL
  Filled 2021-08-13 (×3): qty 1

## 2021-08-13 NOTE — Progress Notes (Signed)
Patient stated that all the other medications on her pain regiment do not give her any relief, only the dilaudid works for her. ?

## 2021-08-13 NOTE — Progress Notes (Signed)
?PROGRESS NOTE ? ? ? ?Latoya [name]  XNA:[id number] DOB: 06-Jul-1985 DOA: 08/02/2021 ?PCP: Pcp, No  ?Narrative 35/F with history of IV heroin abuse, history of MSSA tricuspid valve endocarditis in 20/20 treated with Ancef and anterior checked at [facility], history of MRSA discitis/osteomyelitis in 2021 treated with antibiotics presented to the ED with low back pain, cough, fevers.  MRI LS spine negative for infection ?-Found to have MRSA bacteremia with tricuspid valve vegetation, septic pulmonary emboli ?-TEE 4/4 noted large highly mobile 1.8 cm tricuspid valve vegetation with severe TR ?-TCTS consulted, plan for angio [facility] 4/7, transferred to [facility] for surgery ?Unable to have angioVAC due to PFO ? ? ?Subjective: ?Only dilaudid helps back pain ? ?Assessment and Plan: ? ?MRSA tricuspid valve endocarditis ?Severe TR ?Severe sepsis, poa ?-Continue IV vancomycin ?-Repeat blood cultures 4/2 considered negative, staph epi likely contaminant ?-ID following ?-TCTS consulting, unable to have angiovac due to PFO ?-will need 6 weeks of abx ? ?Cough, septic pulmonary emboli ?-Antibiotics as above, add guaifenesin, Tessalon Perles ? ?Hyponatremia ?-Improved ? ?Iron deficiency anemia ?Iron panel with serum iron of 22, transferrin saturation 7, ferritin 51 and TIBC 312. ? ?IV drug abuse (HCC) ?-Counseled again ? ?Opioid use disorder ?Continue pain control with oxycodone 5 mg p.o. q4h PRN moderate pain and Dilaudid 1 mg IV q2h PRN severe pain ? ?DVT prophylaxis: Lovenox ?Code Status: Full code ?Family Communication: patient  ?Disposition Plan: Home after antibiotic therapy completed ? ?Consultants:  ?TCTS, infectious disease ? ? ? ?Objective: ?Vitals:  ? 08/12/21 2003 08/12/21 2304 08/13/21 0515 08/13/21 5427  ?BP: 126/90 113/80 112/89 114/88  ?Pulse: (!) 105 97 93 96  ?Resp: 20 18 19 19   ?Temp: 98 ?F (36.7 ?C) 98.2 ?F (36.8 ?C) 97.9 ?F (36.6 ?C) 98.2 ?F (36.8 ?C)  ?TempSrc: Oral Oral Oral Oral  ?SpO2: 99% 97% 95% 98%   ?Weight:      ?Height:      ? ? ?Intake/Output Summary (Last 24 hours) at 08/13/2021 1225 ?Last data filed at 08/13/2021 0400 ?Gross per 24 hour  ?Intake 553 ml  ?Output 900 ml  ?Net -347 ml  ? ?Filed Weights  ? 08/03/21 1036 08/08/21 1111 08/09/21 1607  ?Weight: 82.2 kg 82.2 kg 87.5 kg  ? ? ?Examination: ? ? ? ?General: Appearance:    Obese female in no acute distress  ?   ?Lungs:     Clear to auscultation bilaterally, respirations unlabored  ?Heart:    Normal heart rate. Normal rhythm. No murmurs, rubs, or gallops.  ?  ?MS:   All extremities are intact.  ?  ?Neurologic:   Awake, alert, oriented x 3. No apparent focal neurological           defect.   ?  ?  ? ? ?Data Reviewed:  ? ?CBC: ?Recent Labs  ?Lab 08/08/21 ?0434 08/09/21 ?0659 08/10/21 ?0744 08/11/21 ?10/11/21 08/11/21 ?1251 08/12/21 ?1100  ?WBC 13.2* 13.8* 13.3* 12.1*  --  15.3*  ?HGB 7.5* 7.9* 7.8* 8.7* 8.5* 9.2*  ?HCT 23.5* 25.5* 25.0* 29.0* 25.0* 31.3*  ?MCV 72.5* 73.5* 74.4* 74.6*  --  76.9*  ?PLT 322 378 370 309  --  407*  ? ?Basic Metabolic Panel: ?Recent Labs  ?Lab 08/08/21 ?0434 08/09/21 ?0659 08/10/21 ?0744 08/11/21 ?10/11/21 08/11/21 ?1251 08/12/21 ?1100  ?NA 132* 133* 135 135 139 137  ?K 4.3 4.1 4.3 4.2 4.2 4.2  ?CL 103 104 107 107 105 107  ?CO2 23 23 21* 21*  --  23  ?GLUCOSE 130* 120* 110* 117* 100* 130*  ?BUN 13 19 10 11 9 10   ?CREATININE 0.54 0.62 0.56 0.61 0.40* 0.66  ?CALCIUM 7.8* 7.8* 8.1* 8.4*  --  9.0  ?MG 2.3 2.3  --   --   --   --   ? ?GFR: ?Estimated Creatinine Clearance: 110.4 mL/min (by C-G formula based on SCr of 0.66 mg/dL). ?Liver Function Tests: ?No results for input(s): AST, ALT, ALKPHOS, BILITOT, PROT, ALBUMIN in the last 168 hours. ? ?No results for input(s): LIPASE, AMYLASE in the last 168 hours. ?No results for input(s): AMMONIA in the last 168 hours. ?Coagulation Profile: ?No results for input(s): INR, PROTIME in the last 168 hours. ?Cardiac Enzymes: ?No results for input(s): CKTOTAL, CKMB, CKMBINDEX, TROPONINI in the last 168  hours. ?BNP (last 3 results) ?No results for input(s): PROBNP in the last 8760 hours. ?HbA1C: ?No results for input(s): HGBA1C in the last 72 hours. ?CBG: ?No results for input(s): GLUCAP in the last 168 hours. ?Lipid Profile: ?No results for input(s): CHOL, HDL, LDLCALC, TRIG, CHOLHDL, LDLDIRECT in the last 72 hours. ?Thyroid Function Tests: ?No results for input(s): TSH, T4TOTAL, FREET4, T3FREE, THYROIDAB in the last 72 hours. ?Anemia Panel: ?No results for input(s): VITAMINB12, FOLATE, FERRITIN, TIBC, IRON, RETICCTPCT in the last 72 hours. ?Urine analysis: ?   ?Component Value Date/Time  ? COLORURINE YELLOW 08/02/2021 2028  ? APPEARANCEUR HAZY (A) 08/02/2021 2028  ? LABSPEC 1.012 08/02/2021 2028  ? PHURINE 6.0 08/02/2021 2028  ? GLUCOSEU NEGATIVE 08/02/2021 2028  ? HGBUR SMALL (A) 08/02/2021 2028  ? BILIRUBINUR NEGATIVE 08/02/2021 2028  ? BILIRUBINUR negative 06/21/2021 1755  ? KETONESUR NEGATIVE 08/02/2021 2028  ? PROTEINUR 30 (A) 08/02/2021 2028  ? UROBILINOGEN 0.2 06/21/2021 1755  ? NITRITE NEGATIVE 08/02/2021 2028  ? LEUKOCYTESUR SMALL (A) 08/02/2021 2028  ? ? ? ? ?Recent Results (from the past 240 hour(s))  ?Culture, blood (routine x 2)     Status: Abnormal  ? Collection Time: 08/04/21  6:27 AM  ? Specimen: BLOOD  ?Result Value Ref Range Status  ? Specimen Description BLOOD BLOOD RIGHT HAND  Final  ? Special Requests   Final  ?  BOTTLES DRAWN AEROBIC AND ANAEROBIC Blood Culture adequate volume  ? Culture  Setup Time   Final  ?  GRAM POSITIVE COCCI IN CLUSTERS ?ANAEROBIC BOTTLE ONLY ?CRITICAL VALUE NOTED.  VALUE IS CONSISTENT WITH PREVIOUSLY REPORTED AND CALLED VALUE. ?  ? Culture (A)  Final  ?  STAPHYLOCOCCUS AUREUS ?SUSCEPTIBILITIES PERFORMED ON PREVIOUS CULTURE WITHIN THE LAST 5 DAYS. ?Performed at [facility], [address] [place], [place] [address] ?  ? Report Status 08/08/2021 FINAL  Final  ?Culture, blood (routine x 2)     Status: None  ? Collection Time: 08/06/21 12:48 AM  ? Specimen: BLOOD   ?Result Value Ref Range Status  ? Specimen Description   Final  ?  BLOOD BLOOD LEFT HAND ?Performed at [facility], [address] [place], [place] [address] ?  ? Special Requests   Final  ?  BOTTLES DRAWN AEROBIC ONLY Blood Culture adequate volume ?Performed at [facility], [address] [place], [place] [address] ?  ? Culture   Final  ?  NO GROWTH 5 DAYS ?Performed at [facility], [address] [place], [place] [address] ?  ? Report Status 08/11/2021 FINAL  Final  ?Culture, blood (routine x 2)     Status: Abnormal  ?  Collection Time: 08/06/21 12:48 AM  ? Specimen: BLOOD  ?Result Value Ref Range Status  ? Specimen Description   Final  ?  BLOOD BLOOD RIGHT HAND ?Performed at [facility], [address] [place], [place] [address] ?  ? Special Requests   Final  ?  BOTTLES DRAWN AEROBIC ONLY Blood Culture adequate volume ?Performed at [facility], [address] [place], [place] [address] ?  ? Culture  Setup Time   Final  ?  GRAM POSITIVE COCCI IN CLUSTERS ?AEROBIC BOTTLE ONLY ?CRITICAL VALUE NOTED.  VALUE IS CONSISTENT WITH PREVIOUSLY REPORTED AND CALLED VALUE. ?  ? Culture (A)  Final  ?  STAPHYLOCOCCUS EPIDERMIDIS ?THE SIGNIFICANCE OF ISOLATING THIS ORGANISM FROM A SINGLE SET OF BLOOD CULTURES WHEN MULTIPLE SETS ARE DRAWN IS UNCERTAIN. PLEASE NOTIFY THE MICROBIOLOGY DEPARTMENT WITHIN ONE WEEK IF SPECIATION AND SENSITIVITIES ARE REQUIRED. ?Performed at [facility], [address], [place], [place] [address] ?  ? Report Status 08/09/2021 FINAL  Final  ?  ? ?Radiology Studies: ?DG Chest Port 1V same Day ? ?Result Date: 08/11/2021 ?CLINICAL DATA:  Postoperative state. Right IJ central line placement. EXAM: PORTABLE CHEST 1 VIEW COMPARISON:  Radiographs 08/02/2021 and 03/24/2021.  CT 08/04/2021. FINDINGS: 1434 hours. A new right IJ central venous catheter projects laterally along the expected course of the right subclavian  vein. The heart size and mediastinal contours are stable. There is a small right pleural effusion, but no evidence of pneumothorax. Bilateral pulmonary nodularity has increased from the prior radiographs, although

## 2021-08-13 NOTE — Progress Notes (Signed)
Mobility Specialist Progress Note  ? ? 08/13/21 1116  ?Mobility  ?Activity Ambulated with assistance to bathroom  ?Level of Assistance Standby assist, set-up cues, supervision of patient - no hands on  ?Assistive Device Front wheel walker  ?Distance Ambulated (ft) 60 ft  ?Activity Response Tolerated fair  ?$Mobility charge 1 Mobility  ? ?Pt received in bed and agreeable. Stood from elevated bed. Had void and BM. Returned to chair with call bell in reach.  ? ?Latoya [name] ?Mobility Specialist  ?  ?

## 2021-08-14 DIAGNOSIS — I079 Rheumatic tricuspid valve disease, unspecified: Secondary | ICD-10-CM | POA: Diagnosis not present

## 2021-08-14 LAB — CBC WITH DIFFERENTIAL/PLATELET
Abs Immature Granulocytes: 0.1 10*3/uL — ABNORMAL HIGH (ref 0.00–0.07)
Basophils Absolute: 0.1 10*3/uL (ref 0.0–0.1)
Basophils Relative: 1 %
Eosinophils Absolute: 0.3 10*3/uL (ref 0.0–0.5)
Eosinophils Relative: 2 %
HCT: 25.9 % — ABNORMAL LOW (ref 36.0–46.0)
Hemoglobin: 7.9 g/dL — ABNORMAL LOW (ref 12.0–15.0)
Immature Granulocytes: 1 %
Lymphocytes Relative: 18 %
Lymphs Abs: 2.6 10*3/uL (ref 0.7–4.0)
MCH: 23.3 pg — ABNORMAL LOW (ref 26.0–34.0)
MCHC: 30.5 g/dL (ref 30.0–36.0)
MCV: 76.4 fL — ABNORMAL LOW (ref 80.0–100.0)
Monocytes Absolute: 0.7 10*3/uL (ref 0.1–1.0)
Monocytes Relative: 5 %
Neutro Abs: 10.2 10*3/uL — ABNORMAL HIGH (ref 1.7–7.7)
Neutrophils Relative %: 73 %
Platelets: 329 10*3/uL (ref 150–400)
RBC: 3.39 MIL/uL — ABNORMAL LOW (ref 3.87–5.11)
RDW: 22.9 % — ABNORMAL HIGH (ref 11.5–15.5)
WBC: 14 10*3/uL — ABNORMAL HIGH (ref 4.0–10.5)
nRBC: 0 % (ref 0.0–0.2)

## 2021-08-14 LAB — BASIC METABOLIC PANEL
Anion gap: 6 (ref 5–15)
BUN: 12 mg/dL (ref 6–20)
CO2: 23 mmol/L (ref 22–32)
Calcium: 8.4 mg/dL — ABNORMAL LOW (ref 8.9–10.3)
Chloride: 104 mmol/L (ref 98–111)
Creatinine, Ser: 0.56 mg/dL (ref 0.44–1.00)
GFR, Estimated: >60 mL/min (ref >60)
Glucose, Bld: 106 mg/dL — ABNORMAL HIGH (ref 70–99)
Potassium: 4.4 mmol/L (ref 3.5–5.1)
Sodium: 133 mmol/L — ABNORMAL LOW (ref 135–145)

## 2021-08-14 LAB — VANCOMYCIN, TROUGH: Vancomycin Tr: 15 ug/mL (ref 15–20)

## 2021-08-14 LAB — VANCOMYCIN, PEAK: Vancomycin Pk: 29 ug/mL — ABNORMAL LOW (ref 30–40)

## 2021-08-14 MED ORDER — DULOXETINE HCL 20 MG PO CPEP
40.0000 mg | ORAL_CAPSULE | Freq: Every day | ORAL | Status: DC
  Administered 2021-08-15 – 2021-09-17 (×34): 40 mg via ORAL
  Filled 2021-08-14 (×34): qty 2

## 2021-08-14 MED ORDER — VANCOMYCIN HCL IN DEXTROSE 1-5 GM/200ML-% IV SOLN
1000.0000 mg | Freq: Two times a day (BID) | INTRAVENOUS | Status: DC
  Administered 2021-08-15 – 2021-08-21 (×13): 1000 mg via INTRAVENOUS
  Filled 2021-08-14 (×14): qty 200

## 2021-08-14 NOTE — Progress Notes (Signed)
?PROGRESS NOTE ? ? ? ?Latoya [name]  XNT:[id number] DOB: [birth year] DOA: 08/02/2021 ?PCP: Pcp, No  ?Narrative 35/F with history of IV heroin abuse, history of MSSA tricuspid valve endocarditis in 20/20 treated with Ancef and anterior checked at [facility], history of MRSA discitis/osteomyelitis in 2021 treated with antibiotics presented to the ED with low back pain, cough, fevers.  MRI LS spine negative for infection ?-Found to have MRSA bacteremia with tricuspid valve vegetation, septic pulmonary emboli ?-TEE 4/4 noted large highly mobile 1.8 cm tricuspid valve vegetation with severe TR ?-TCTS consulted and plan was for angio [facility] 4/7, transferred to [facility] for surgery but unable to have angioVAC due to PFO ? ? ?Subjective: ?Still with back pain ? ?Assessment and Plan: ? ?MRSA tricuspid valve endocarditis ?Severe TR ?Severe sepsis, poa ?-Continue IV vancomycin ?-Repeat blood cultures 4/2 considered negative, staph epi likely contaminant ?-ID following ?-TCTS consulting, unable to have angiovac due: The transesophageal echocardiogram revealed a positive bubble study concerning for patent foramen ovale.  On further investigation there was a connection between the right and left atrium with flows from the right to left side.  I decided to abort the case due to concern for an embolic stroke.  (on prior TEE did not show PFO/shunt and she had angiovac in 2020 at [facility]) ?-will need 6 weeks of abx from 4/2 ? ?Cough, septic pulmonary emboli ?-Antibiotics as above, add guaifenesin, Tessalon Perles ? ?Hyponatremia ?-Improved ? ?Iron deficiency anemia ?Iron panel with serum iron of 22, transferrin saturation 7, ferritin 51 and TIBC 312. ?-s/p IV Fe ? ?IV drug abuse (HCC) ?-Counseled again ? ?Opioid use disorder ?Continue pain control with oxycodone 5 mg p.o. q4h PRN moderate pain and Dilaudid 1 mg IV q2h PRN severe pain ?-ibuprofen ? ?DVT prophylaxis: xarelto (refusing lovenox) ?Code Status: Full code ?Family  Communication: patient  ?Disposition Plan: Home after antibiotic therapy completed ? ?Consultants:  ?TCTS, infectious disease ? ? ? ?Objective: ?Vitals:  ? 08/13/21 2347 08/14/21 0400 08/14/21 0828 08/14/21 1143  ?BP: 133/90 109/89 (!) 118/93 (!) 121/97  ?Pulse: (!) 105 (!) 105 95   ?Resp: 18 (!) 22 18 (!) 23  ?Temp: 100 ?F (37.8 ?C) 98.4 ?F (36.9 ?C) 97.6 ?F (36.4 ?C) 97.6 ?F (36.4 ?C)  ?TempSrc: Oral Oral Oral Axillary  ?SpO2: 98% 95% 98% 98%  ?Weight:      ?Height:      ? ? ?Intake/Output Summary (Last 24 hours) at 08/14/2021 1423 ?Last data filed at 08/14/2021 1144 ?Gross per 24 hour  ?Intake 810 ml  ?Output 1100 ml  ?Net -290 ml  ? ?Filed Weights  ? 08/03/21 1036 08/08/21 1111 08/09/21 1607  ?Weight: 82.2 kg 82.2 kg 87.5 kg  ? ? ?Examination: ? ? ? ?General: Appearance:    Obese female in no acute distress  ?   ?Lungs:     respirations unlabored  ?Heart:    Normal heart rate.  ?  ?MS:   All extremities are intact.  ?  ?Neurologic:   Awake, alert, oriented x 3  ?  ? ? ?Data Reviewed:  ? ?CBC: ?Recent Labs  ?Lab 08/09/21 ?0659 08/10/21 ?0744 08/11/21 ?9675 08/11/21 ?1251 08/12/21 ?1100 08/14/21 ?9163  ?WBC 13.8* 13.3* 12.1*  --  15.3* 14.0*  ?NEUTROABS  --   --   --   --   --  10.2*  ?HGB 7.9* 7.8* 8.7* 8.5* 9.2* 7.9*  ?HCT 25.5* 25.0* 29.0* 25.0* 31.3* 25.9*  ?MCV 73.5* 74.4* 74.6*  --  76.9* 76.4*  ?PLT 378 370 309  --  407* 329  ? ?Basic Metabolic Panel: ?Recent Labs  ?Lab 08/08/21 ?0434 08/09/21 ?[id number] 08/10/21 ?[id number] 08/11/21 ?[id number] 08/11/21 ?1251 08/12/21 ?1100 08/14/21 ?[id number]  ?NA 132* 133* 135 135 139 137 133*  ?K 4.3 4.1 4.3 4.2 4.2 4.2 4.4  ?CL 103 104 107 107 105 107 104  ?CO2 23 23 21* 21*  --  23 23  ?GLUCOSE 130* 120* 110* 117* 100* 130* 106*  ?BUN 13 19 10 11 9 10 12   ?CREATININE 0.54 0.62 0.56 0.61 0.40* 0.66 0.56  ?CALCIUM 7.8* 7.8* 8.1* 8.4*  --  9.0 8.4*  ?MG 2.3 2.3  --   --   --   --   --   ? ?GFR: ?Estimated Creatinine Clearance: 110.4 mL/min (by C-G formula based on SCr of 0.56 mg/dL). ?Liver  Function Tests: ?No results for input(s): AST, ALT, ALKPHOS, BILITOT, PROT, ALBUMIN in the last 168 hours. ? ?No results for input(s): LIPASE, AMYLASE in the last 168 hours. ?No results for input(s): AMMONIA in the last 168 hours. ?Coagulation Profile: ?No results for input(s): INR, PROTIME in the last 168 hours. ?Cardiac Enzymes: ?No results for input(s): CKTOTAL, CKMB, CKMBINDEX, TROPONINI in the last 168 hours. ?BNP (last 3 results) ?No results for input(s): PROBNP in the last 8760 hours. ?HbA1C: ?No results for input(s): HGBA1C in the last 72 hours. ?CBG: ?No results for input(s): GLUCAP in the last 168 hours. ?Lipid Profile: ?No results for input(s): CHOL, HDL, LDLCALC, TRIG, CHOLHDL, LDLDIRECT in the last 72 hours. ?Thyroid Function Tests: ?No results for input(s): TSH, T4TOTAL, FREET4, T3FREE, THYROIDAB in the last 72 hours. ?Anemia Panel: ?No results for input(s): VITAMINB12, FOLATE, FERRITIN, TIBC, IRON, RETICCTPCT in the last 72 hours. ?Urine analysis: ?   ?Component Value Date/Time  ? COLORURINE YELLOW 08/02/2021 2028  ? APPEARANCEUR HAZY (A) 08/02/2021 2028  ? LABSPEC 1.012 08/02/2021 2028  ? PHURINE 6.0 08/02/2021 2028  ? GLUCOSEU NEGATIVE 08/02/2021 2028  ? HGBUR SMALL (A) 08/02/2021 2028  ? BILIRUBINUR NEGATIVE 08/02/2021 2028  ? BILIRUBINUR negative 06/21/2021 1755  ? KETONESUR NEGATIVE 08/02/2021 2028  ? PROTEINUR 30 (A) 08/02/2021 2028  ? UROBILINOGEN 0.2 06/21/2021 1755  ? NITRITE NEGATIVE 08/02/2021 2028  ? LEUKOCYTESUR SMALL (A) 08/02/2021 2028  ? ? ? ? ?Recent Results (from the past 240 hour(s))  ?Culture, blood (routine x 2)     Status: None  ? Collection Time: 08/06/21 12:48 AM  ? Specimen: BLOOD  ?Result Value Ref Range Status  ? Specimen Description   Final  ?  BLOOD BLOOD LEFT HAND ?Performed at [facility], [address] [place], [place] [address] ?  ? Special Requests   Final  ?  BOTTLES DRAWN AEROBIC ONLY Blood Culture adequate volume ?Performed at [facility], [address] [place], [place] [address] ?  ? Culture   Final  ?  NO GROWTH 5 DAYS ?Performed at [facility], [address] [place], [place] [address] ?  ? Report Status 08/11/2021 FINAL  Final  ?Culture, blood (routine x 2)     Status: Abnormal  ? Collection Time: 08/06/21 12:48 AM  ? Specimen: BLOOD  ?Result Value Ref Range Status  ? Specimen Description   Final  ?  BLOOD BLOOD RIGHT HAND ?Performed at [facility], [address] [place], [place] [address] ?  ? Special Requests   Final  ?  BOTTLES DRAWN AEROBIC  ONLY Blood Culture adequate volume ?Performed at [facility], [address], [place], [place] [address] ?  ? Culture  Setup Time   Final  ?  GRAM POSITIVE COCCI IN CLUSTERS ?AEROBIC BOTTLE ONLY ?CRITICAL VALUE NOTED.  VALUE IS CONSISTENT WITH PREVIOUSLY REPORTED AND CALLED VALUE. ?  ? Culture (A)  Final  ?  STAPHYLOCOCCUS EPIDERMIDIS ?THE SIGNIFICANCE OF ISOLATING THIS ORGANISM FROM A SINGLE SET OF BLOOD CULTURES WHEN MULTIPLE SETS ARE DRAWN IS UNCERTAIN. PLEASE NOTIFY THE MICROBIOLOGY DEPARTMENT WITHIN ONE WEEK IF SPECIATION AND SENSITIVITIES ARE REQUIRED. ?Performed at [facility], [address], [place], [place] [address] ?  ? Report Status 08/09/2021 FINAL  Final  ?  ? ?Radiology Studies: ?No results found. ? ? ?Scheduled Meds: ? benzonatate  100 mg Oral TID  ? Chlorhexidine Gluconate Cloth  6 each Topical Daily  ? [START ON 08/15/2021] DULoxetine  40 mg Oral Daily  ? feeding supplement  1 Container Oral BID BM  ? feeding supplement  237 mL Oral Q24H  ? ferrous gluconate  324 mg Oral Q breakfast  ? magnesium oxide  400 mg Oral BID  ? multivitamin with minerals  1 tablet Oral Daily  ? nicotine  21 mg Transdermal Daily  ? pantoprazole  40 mg Oral Daily  ? rivaroxaban  10 mg Oral Q supper  ? thiamine  100 mg Oral Daily  ? ?Continuous Infusions: ? lactated ringers Stopped (08/11/21 1336)  ? vancomycin 750 mg (08/14/21 0915)   ? ? ? LOS: 12 days  ? ? ?Time spent: ? ? ?Marlin Canary DO ?[facility] ? ? ?08/14/2021, 2:23 PM  ?  ?

## 2021-08-14 NOTE — Progress Notes (Signed)
?   ? ? ? ? ?[facility] for Infectious Disease ? ?Date of Admission:  08/02/2021   Total days of inpatient antibiotics 9 ? ?Principal Problem: ?  Endocarditis of tricuspid valve ?Active Problems: ?  Iron deficiency anemia ?  Opioid use disorder ?  IV drug abuse ([place]) ?  History of bacterial endocarditis ?  Hyponatremia ?     ?    ?Assessment: ?61 YF transferred from [facility] for MRSA tricuspid valve endocarditis. She is currently on vancomycin. Transferred from [facility] for angiovac.  ?#MRSA native TV endocarditis complicated with septic pulmonary emboli with severe MR ?#IVDA ?#Tobacco abuse ?-Hx of MSSA TV endocarditis SP angiovac and treated with cefazolin in 2020 at [facility] ?-MRSA L4-L5 facet joint septic arthritis in 07/2019 ?-Hep B surface ag negative, HCV RNA negative, HIV negative ?-Angiovac on 4/7 was aborted as evidence of PFO noted ?Recommendations: ?-Continue vancomycin ?-Restart the clock on 6 weeks of antibiotics from negative Cx EOT 5/13 ?-Pt is not a home IV antibiotics candidate.  ?Microbiology:   ?Antibiotics: ?Vancomycin 3/29-p ?Cefepime 3/29-3/30 ?Metronidazole 3/29-3/30 ? ?Cultures: ?Blood ?3/21 1/1 MRSA ?4/2 1/2 staph epi ? ? ? ?SUBJECTIVE: ?Resting in bed on RA. No new complaints.She is amenable staying in patient/SNF to complete IV antibiotics.  ? ?Review of Systems: ?Review of Systems  ?All other systems reviewed and are negative. ? ? ?Scheduled Meds: ? benzonatate  100 mg Oral TID  ? Chlorhexidine Gluconate Cloth  6 each Topical Daily  ? [START ON 08/15/2021] DULoxetine  40 mg Oral Daily  ? feeding supplement  1 Container Oral BID BM  ? feeding supplement  237 mL Oral Q24H  ? ferrous gluconate  324 mg Oral Q breakfast  ? magnesium oxide  400 mg Oral BID  ? multivitamin with minerals  1 tablet Oral Daily  ? nicotine  21 mg Transdermal Daily  ? pantoprazole  40 mg Oral Daily  ? rivaroxaban  10 mg Oral Q supper  ? thiamine  100 mg Oral Daily  ? ?Continuous Infusions: ? lactated ringers Stopped  (08/11/21 1336)  ? vancomycin 750 mg (08/14/21 0915)  ? ?PRN Meds:.acetaminophen, albuterol, guaiFENesin-dextromethorphan, HYDROmorphone (DILAUDID) injection, ibuprofen, LORazepam, menthol-cetylpyridinium, oxyCODONE ?Allergies  ?Allergen Reactions  ? Naltrexone Anxiety  ?  Severe anxiety, restlessness, vomiting  ? ? ?OBJECTIVE: ?Vitals:  ? 08/13/21 2347 08/14/21 0400 08/14/21 0828 08/14/21 1143  ?BP: 133/90 109/89 (!) 118/93 (!) 121/97  ?Pulse: (!) 105 (!) 105 95   ?Resp: 18 (!) 22 18 (!) 23  ?Temp: 100 ?F (37.8 ?C) 98.4 ?F (36.9 ?C) 97.6 ?F (36.4 ?C) 97.6 ?F (36.4 ?C)  ?TempSrc: Oral Oral Oral Axillary  ?SpO2: 98% 95% 98% 98%  ?Weight:      ?Height:      ? ?Body mass index is 31.14 kg/m?. ? ?Physical Exam ?Constitutional:   ?   Appearance: Normal appearance.  ?HENT:  ?   Head: Normocephalic and atraumatic.  ?   Right Ear: Tympanic membrane normal.  ?   Left Ear: Tympanic membrane normal.  ?   Nose: Nose normal.  ?   Mouth/Throat:  ?   Mouth: Mucous membranes are moist.  ?Eyes:  ?   Extraocular Movements: Extraocular movements intact.  ?   Conjunctiva/sclera: Conjunctivae normal.  ?   Pupils: Pupils are equal, round, and reactive to light.  ?Cardiovascular:  ?   Rate and Rhythm: Normal rate and regular rhythm.  ?   Heart sounds: No murmur heard. ?  No  friction rub. No gallop.  ?Pulmonary:  ?   Effort: Pulmonary effort is normal.  ?   Breath sounds: Normal breath sounds.  ?Abdominal:  ?   General: Abdomen is flat.  ?   Palpations: Abdomen is soft.  ?Musculoskeletal:     ?   General: Normal range of motion.  ?Skin: ?   General: Skin is warm and dry.  ?Neurological:  ?   General: No focal deficit present.  ?   Mental Status: She is alert and oriented to person, place, and time.  ?Psychiatric:     ?   Mood and Affect: Mood normal.  ? ? ? ? ?Lab Results ?Lab Results  ?Component Value Date  ? WBC 14.0 (H) 08/14/2021  ? HGB 7.9 (L) 08/14/2021  ? HCT 25.9 (L) 08/14/2021  ? MCV 76.4 (L) 08/14/2021  ? PLT 329 08/14/2021  ?   ?Lab Results  ?Component Value Date  ? CREATININE 0.56 08/14/2021  ? BUN 12 08/14/2021  ? NA 133 (L) 08/14/2021  ? K 4.4 08/14/2021  ? CL 104 08/14/2021  ? CO2 23 08/14/2021  ?  ?Lab Results  ?Component Value Date  ? ALT 15 08/04/2021  ? AST 30 08/04/2021  ? ALKPHOS 60 08/04/2021  ? BILITOT 0.7 08/04/2021  ?  ? ? ? ? ?Laurice Record, MD ?[facility] ?[facility] ?08/14/2021, 2:27 PM  ?

## 2021-08-14 NOTE — Progress Notes (Signed)
PT Cancellation Note ? ?Patient Details ?Name: Latoya [name] ?MRN: [id number] ?DOB: 1985/05/13 ? ? ?Cancelled Treatment:    Reason Eval/Treat Not Completed: Pain limiting ability to participate. Pt declining due to back pain. PT to re-attempt as time allows. ? ? ?[name] ?08/14/2021, 12:00 PM ? ?Aida Raider, PT  ?Office # [phone] ?Pager [phone] ? ?

## 2021-08-14 NOTE — Progress Notes (Signed)
Pharmacy Antibiotic Note ? ?Latoya [name] is a 36 y.o. female admitted on 08/02/2021 with MRSA bacteremia- possible septic pulmonary emboli/endocarditis. Pharmacy has been consulted for Vancomycin dosing. Scr is at baseline of 0.5 with estimated CrCl > 100 ml/min.  ? ?Vancomycin levels showed a peak of 29 and a trough of 15 on Vancomycin 750mg  IV q8h.  Calculated AUC 591.8 which is at the upper end of goal range (450-600).  Will adjust dose. ? ? ?Plan: ?- Change Vancomycin to 1000 mg every 12 hours - Predicted AUC 525 with Cmin 11.2 ?- Will continue to follow renal function, culture results, LOT, and antibiotic plans ? ?Height: 5\' 6"  (167.6 cm) ?Weight: 87.5 kg (192 lb 14.4 oz) ?IBW/kg (Calculated) : 59.3 ? ?Temp (24hrs), Avg:98.3 ?F (36.8 ?C), Min:97.6 ?F (36.4 ?C), Max:100 ?F (37.8 ?C) ? ?Recent Labs  ?Lab 08/07/21 ?2226 08/08/21 ?[id number] 08/08/21 ?[id number] 08/09/21 ?[id number] 08/10/21 ?[id number] 08/11/21 ?[id number] 08/11/21 ?1251 08/12/21 ?1100 08/14/21 ?0412 08/14/21 ?1159 08/14/21 ?1800  ?WBC  --   --    < > 13.8* 13.3* 12.1*  --  15.3* 14.0*  --   --   ?CREATININE  --   --    < > 0.62 0.56 0.61 0.40* 0.66 0.56  --   --   ?VANCOTROUGH  --  14*  --   --   --   --   --   --   --   --  15  ?VANCOPEAK 38  --   --   --   --   --   --   --   --  29*  --   ? < > = values in this interval not displayed.  ? ?  ?Estimated Creatinine Clearance: 110.4 mL/min (by C-G formula based on SCr of 0.56 mg/dL).   ? ?Allergies  ?Allergen Reactions  ? Naltrexone Anxiety  ?  Severe anxiety, restlessness, vomiting  ? ? ?Antimicrobials this admission: ?Cefepime 3/29 >> 3/30 ?Flagyl 3/29 >> 3/30 ?Vancomycin 3/29 >> ? ?Dose adjustments this admission: ?4/3-4/4 VP 38 VT 14- AUC 697 >>Adj to 750 q 8 hours  ?4/10: VP 29, VT 15, AUC 591.8, Cmin 12.5 >> 1gm IV q12h for calculated AUC 525.5 ? ?Microbiology results: ?3/29 Influenza/COVID>> neg ?3/29 MRSA PCR >> positive ?3/29 BCx >> GPC in 4/4 bottles (BCID MRSA) ?3/31 BCX: MRSA - only 1 set drawn  ?4/2 BCx:  NG ? ?Thank you for allowing pharmacy to participate in this patient's care. ? ?[employer], Pharm.D., BCPS ?Clinical Pharmacist ? ?**Pharmacist phone directory can be found on [email or web address] listed under [place]. ? ?08/14/2021 8:06 PM ? ? ? ?

## 2021-08-14 NOTE — Addendum Note (Signed)
Addendum  created 08/14/21 0953 by Undine Roberts, MD  ? Intraprocedure Event edited  ?  ?

## 2021-08-15 ENCOUNTER — Inpatient Hospital Stay: Payer: Self-pay

## 2021-08-15 ENCOUNTER — Inpatient Hospital Stay (HOSPITAL_COMMUNITY): Payer: Medicaid Other

## 2021-08-15 DIAGNOSIS — R059 Cough, unspecified: Secondary | ICD-10-CM | POA: Diagnosis not present

## 2021-08-15 DIAGNOSIS — I079 Rheumatic tricuspid valve disease, unspecified: Secondary | ICD-10-CM | POA: Diagnosis not present

## 2021-08-15 LAB — BASIC METABOLIC PANEL
Anion gap: 6 (ref 5–15)
BUN: 11 mg/dL (ref 6–20)
CO2: 24 mmol/L (ref 22–32)
Calcium: 8.6 mg/dL — ABNORMAL LOW (ref 8.9–10.3)
Chloride: 106 mmol/L (ref 98–111)
Creatinine, Ser: 0.53 mg/dL (ref 0.44–1.00)
GFR, Estimated: >60 mL/min (ref >60)
Glucose, Bld: 98 mg/dL (ref 70–99)
Potassium: 4.2 mmol/L (ref 3.5–5.1)
Sodium: 136 mmol/L (ref 135–145)

## 2021-08-15 LAB — CBC
HCT: 27.8 % — ABNORMAL LOW (ref 36.0–46.0)
Hemoglobin: 8.5 g/dL — ABNORMAL LOW (ref 12.0–15.0)
MCH: 23.5 pg — ABNORMAL LOW (ref 26.0–34.0)
MCHC: 30.6 g/dL (ref 30.0–36.0)
MCV: 76.8 fL — ABNORMAL LOW (ref 80.0–100.0)
Platelets: 265 10*3/uL (ref 150–400)
RBC: 3.62 MIL/uL — ABNORMAL LOW (ref 3.87–5.11)
RDW: 23.2 % — ABNORMAL HIGH (ref 11.5–15.5)
WBC: 10.9 10*3/uL — ABNORMAL HIGH (ref 4.0–10.5)
nRBC: 0 % (ref 0.0–0.2)

## 2021-08-15 IMAGING — XA IR PICC >5YO
3 series · 3 of 3 positions shown · IV contrast (agent unspecified)
Comparison: none

INDICATION: Patient with history of IV drug abuse requiring PICC for long-term
antibiotics for MRSA tricuspid valve endocarditis. Request for PICC
after IV team failed x2 at bedside.

EXAM:
ULTRASOUND AND FLUOROSCOPIC GUIDED PICC LINE INSERTION
MEDICATIONS:
One mL 1 % lidocaine
CONTRAST:  None
FLUOROSCOPY TIME:  seconds (0.1 mGy)
COMPLICATIONS:
None immediate.
TECHNIQUE: The procedure, risks, benefits, and alternatives were explained to
the patient and informed written consent was obtained.

[Series 1: fl (-) angio · 1 of 1 slices shown (1 of 2)]
[im 1/1]
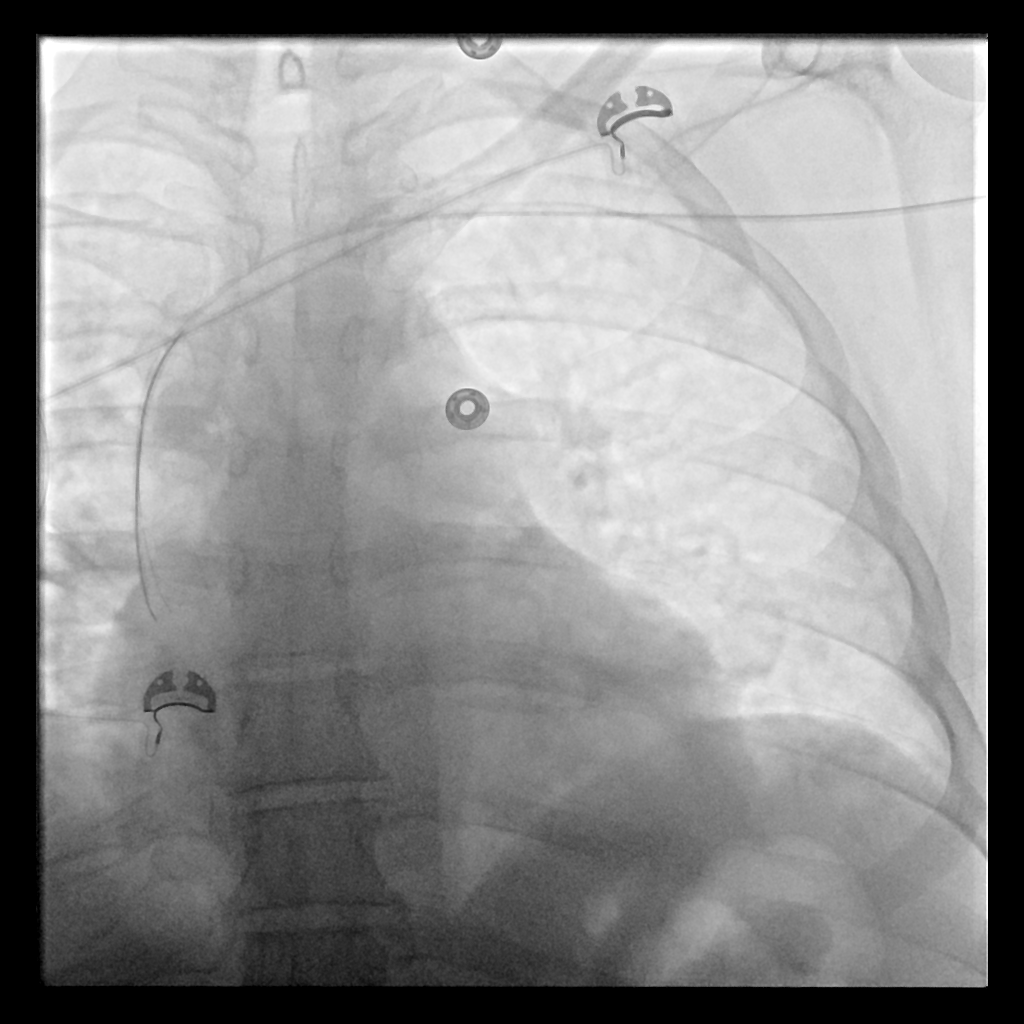

[Series 1: ir picc >5yo · 1 of 1 slices shown]
[im 1/1]
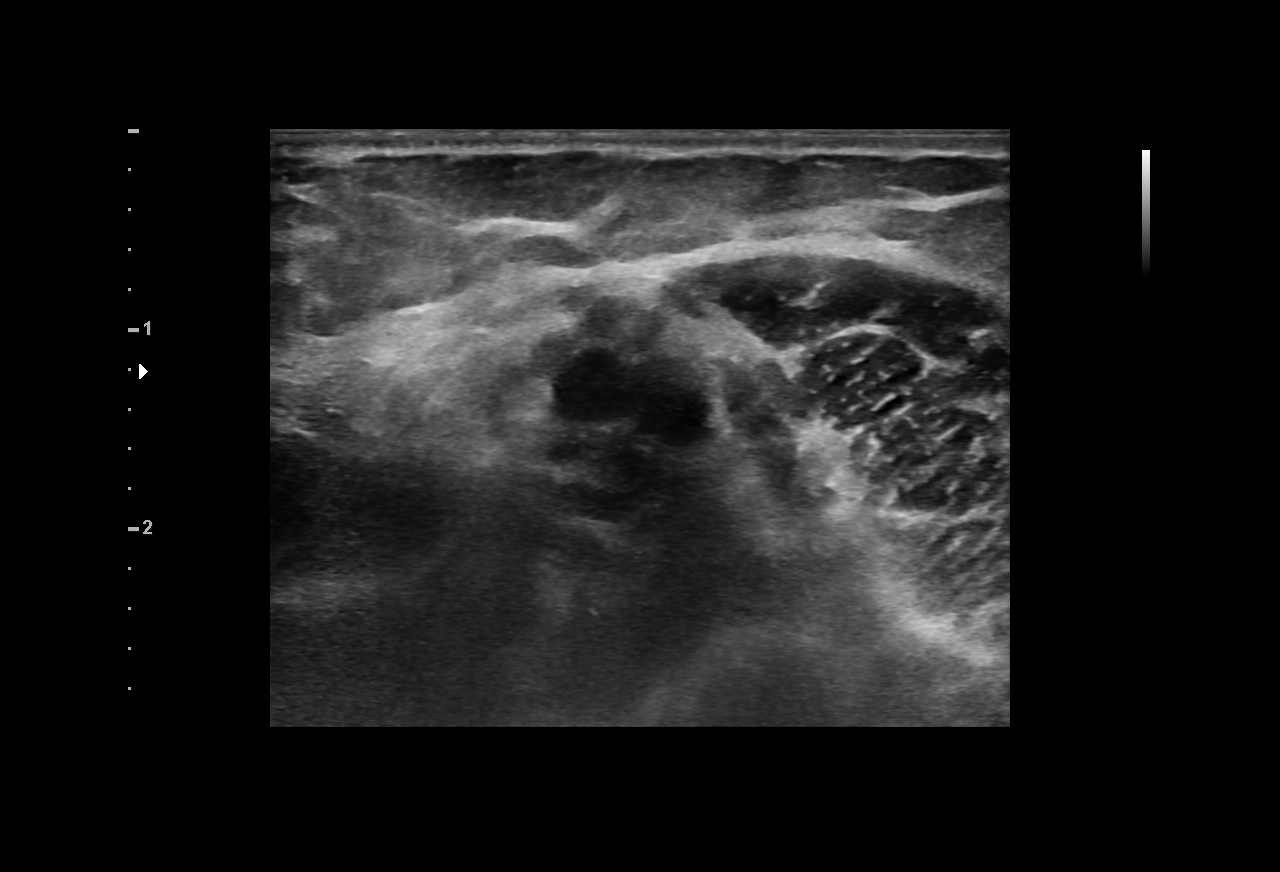

[Series 2: fl (-) angio · 1 of 1 slices shown (2 of 2)]
[im 1/1]
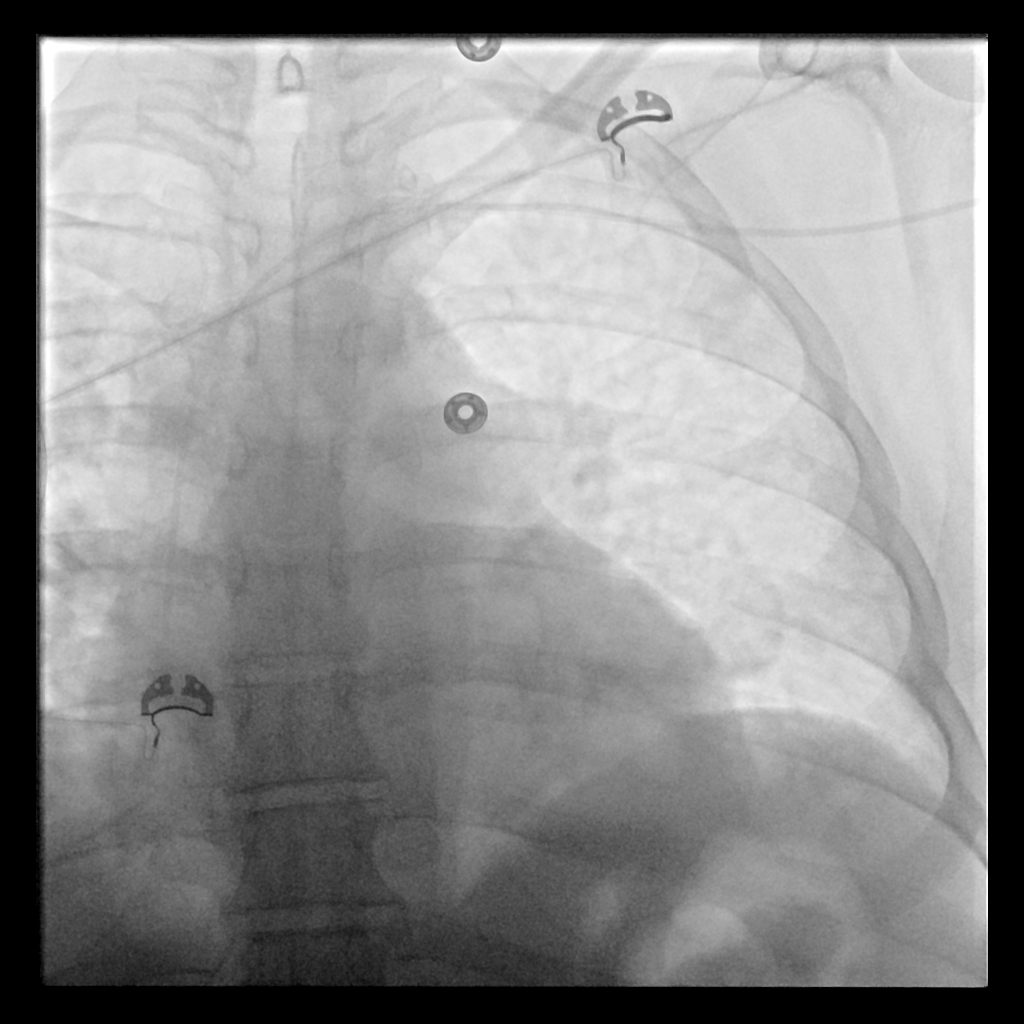

[3 of 3 positions shown; findings below may reference images not displayed]

The left upper extremity was prepped with chlorhexidine in a sterile
fashion, and a sterile drape was applied covering the operative
field. Maximum barrier sterile technique with sterile gowns and
gloves were used for the procedure. A timeout was performed prior to
the initiation of the procedure. Local anesthesia was provided with
1% lidocaine.

After the overlying soft tissues were anesthetized with 1%
lidocaine, a micropuncture kit was utilized to access the left
brachial vein. Real-time ultrasound guidance was utilized for
vascular access including the acquisition of a permanent ultrasound
image documenting patency of the accessed vessel.

A guidewire was advanced to the level of the superior caval-atrial
junction for measurement purposes and the PICC line was cut to
length. A peel-away sheath was placed and a 40 cm, 5 French, dual
lumen was inserted to level of the superior caval-atrial junction. A
post procedure spot fluoroscopic was obtained. The catheter easily
aspirated and flushed and was secured in place. A dressing was
placed. The patient tolerated the procedure well without immediate
post procedural complication.
FINDINGS: After catheter placement, the tip lies within the superior
cavoatrial junction. The catheter aspirates and flushes normally and
is ready for immediate use.
IMPRESSION: Successful ultrasound and fluoroscopic guided placement of a left
brachial vein approach, 40 cm, 5 French, dual lumen PICC with tip at
the superior caval-atrial junction. The PICC line is ready for
immediate use.

## 2021-08-15 IMAGING — DX DG CHEST 1V PORT
1 series · 1 of 1 positions shown · non-contrast
Comparison: [DATE] [DATE], [DATE].  [DATE] [DATE], [DATE].

CLINICAL DATA: Cough.

EXAM:
PORTABLE CHEST 1 VIEW

[chest ap]
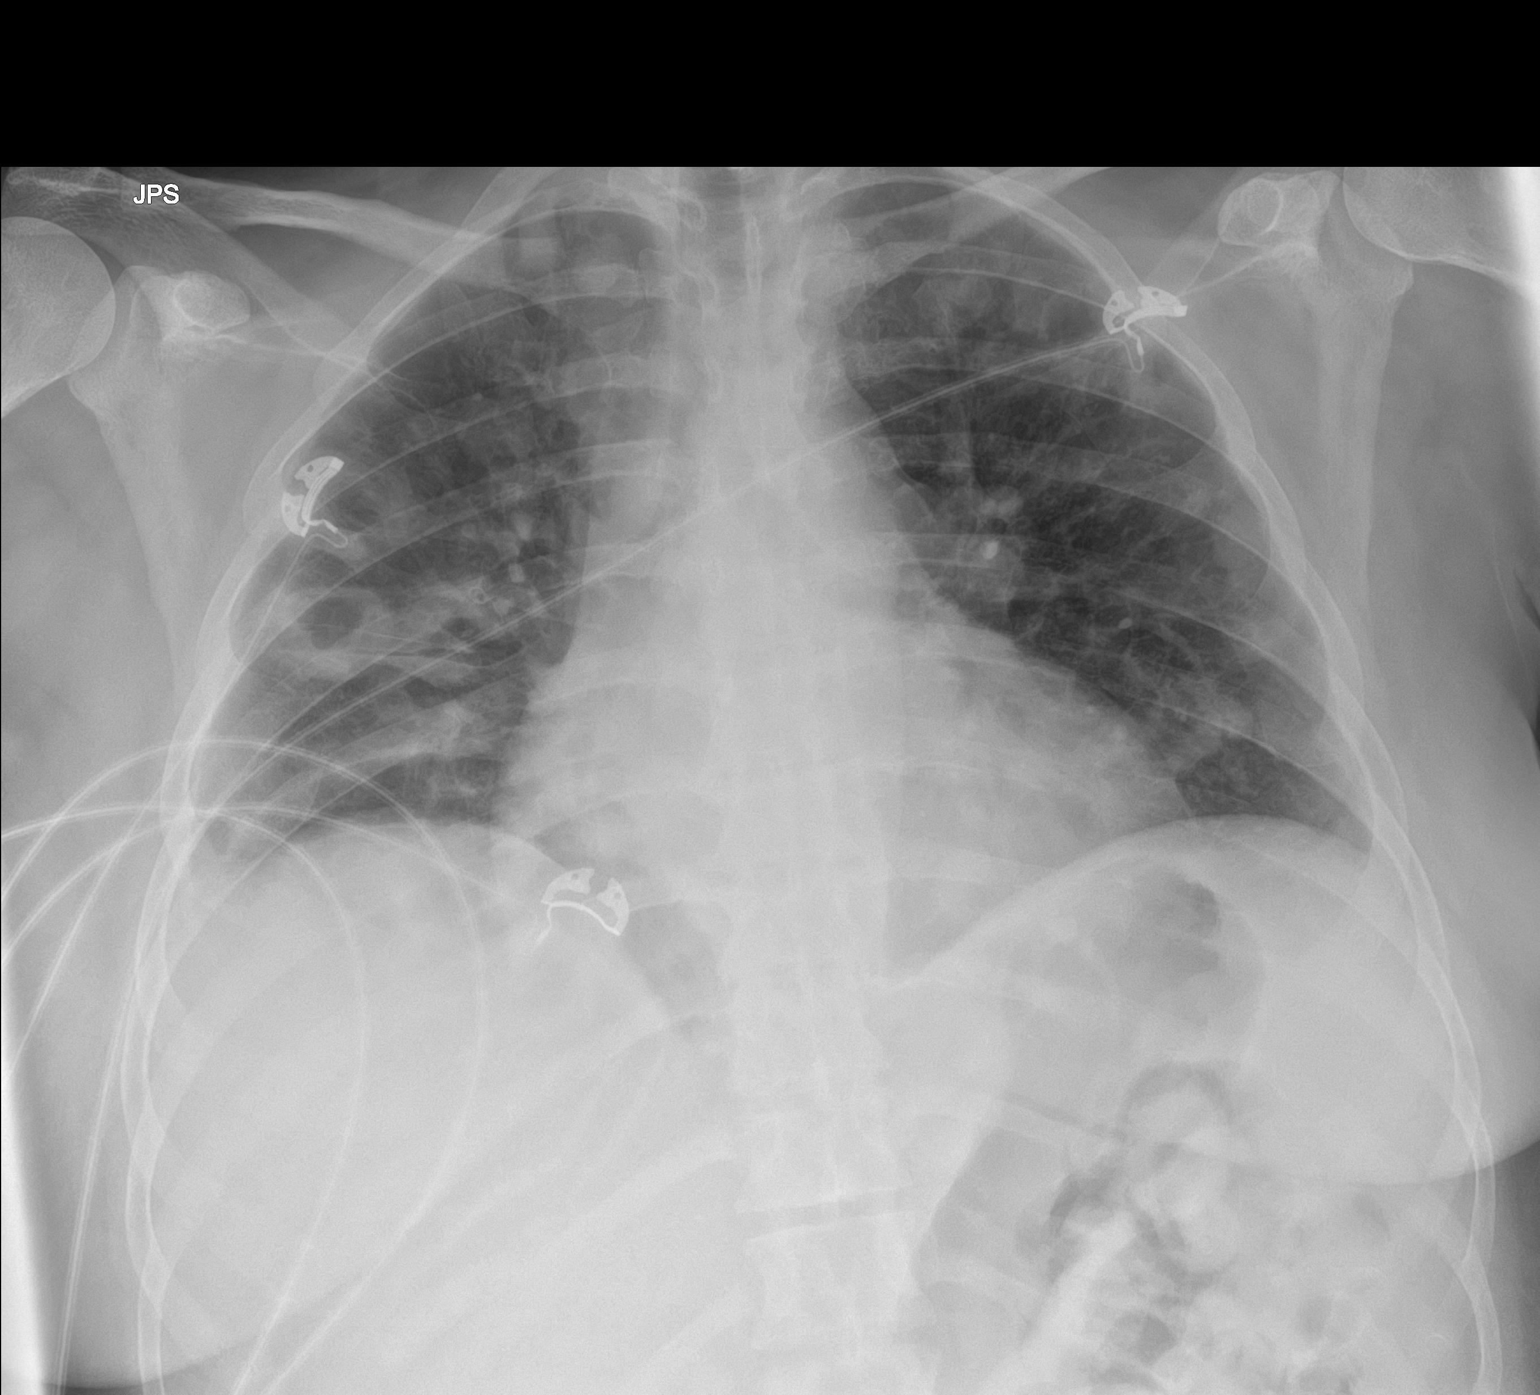

[1 of 1 positions shown; findings below may reference images not displayed]

FINDINGS: Stable cardiomediastinal silhouette. Multiple nodular densities are
noted throughout both lungs, including cavitary nodule seen in right
midlung. These are concerning for septic emboli. Bony thorax is
unremarkable.
IMPRESSION: Multiple nodular densities are noted through out both lungs,
including cavitary nodule in right midlung, consistent with septic
emboli.

## 2021-08-15 MED ORDER — LIDOCAINE HCL 1 % IJ SOLN
INTRAMUSCULAR | Status: DC | PRN
  Administered 2021-08-15: 5 mL via INTRADERMAL

## 2021-08-15 MED ORDER — LIDOCAINE HCL 1 % IJ SOLN
INTRAMUSCULAR | Status: AC
  Filled 2021-08-15: qty 20

## 2021-08-15 NOTE — Progress Notes (Signed)
Occupational Therapy Treatment ?Patient Details ?Name: Latoya [name] ?MRN: [id number] ?DOB: December 30, 1985 ?Today's Date: 08/15/2021 ? ? ?History of present illness Latoya [name] is a 36 y.o. female admitted with back pain, hypotension, hyponatremia, leukocytosis and neutrophilia. Found to have bacteremia possible sepitc pulmonary emboli from endocarditis. Plan for OR for angio vac debridement on 4/7. PMH: IVDU, heroin abuse, ongoing. ?  ?OT comments ? Pt was premedicated prior to session and reporting 8/10 pain level and then 9/10 at the end of session. Pt agreed to complete transfer to [facility] with min guard and FW. Pt required total assist for peri care following urination. She then was encouraged to complete UE dressing and was able to complete at EOB with with min guard. Pt required min guard to mod assist with bed mobility. Pt currently with functional limitations due to the deficits listed below (see OT Problem List).  Pt will benefit from skilled OT to increase their safety and independence with ADL and functional mobility for ADL to facilitate discharge to venue listed below.  ?  ? ?Recommendations for follow up therapy are one component of a multi-disciplinary discharge planning process, led by the attending physician.  Recommendations may be updated based on patient status, additional functional criteria and insurance authorization. ?   ?Follow Up Recommendations ? Acute inpatient rehab (3hours/day)  ?  ?Assistance Recommended at Discharge Frequent or constant Supervision/Assistance  ?Patient can return home with the following ? A lot of help with bathing/dressing/bathroom;Two people to help with walking and/or transfers;Assistance with cooking/housework;Direct supervision/assist for medications management;Assist for transportation;Help with stairs or ramp for entrance;Direct supervision/assist for financial management ?  ?Equipment Recommendations ?  (TBD)  ?  ?Recommendations for Other Services   ? ?   ?Precautions / Restrictions Precautions ?Precautions: Fall ?Precaution Comments: Contact precs, monitor HR, pain management ?Restrictions ?Weight Bearing Restrictions: No  ? ? ?  ? ?Mobility Bed Mobility ?Overal bed mobility: Needs Assistance ?Bed Mobility: Supine to Sit, Sit to Supine ?Rolling: Min guard ?  ?Supine to sit: Min guard (attempted to complete log roll but noted when attempting to complete and assist more fearful and was able to complete with min guard, HOB elevated and bed rail with min guard) ?Sit to supine: Mod assist ?  ?  ?  ? ?Transfers ?Overall transfer level: Needs assistance ?Equipment used: Rolling walker (2 wheels) ?Transfers: Sit to/from Stand ?Sit to Stand: Min guard, From elevated surface ?  ?  ?  ?  ?  ?  ?  ?  ?Balance Overall balance assessment: Needs assistance ?Sitting-balance support: Feet supported ?Sitting balance-Leahy Scale: Good ?  ?  ?Standing balance support: Bilateral upper extremity supported ?Standing balance-Leahy Scale: Poor ?Standing balance comment: unable to take BUE off walker for ADLs ?  ?  ?  ?  ?  ?  ?  ?  ?  ?  ?  ?   ? ?ADL either performed or assessed with clinical judgement  ? ?ADL Overall ADL's : Needs assistance/impaired ?Eating/Feeding: Bed level;Set up ?  ?Grooming: Set up;Sitting;Bed level ?  ?Upper Body Bathing: Min guard;Sitting ?  ?  ?  ?  ?  ?  ?  ?Toilet Transfer: Min guard;Cueing for safety;Cueing for sequencing;Rolling walker (2 wheels) ?  ?Toileting- Clothing Manipulation and Hygiene: Total assistance;Cueing for safety;Cueing for sequencing;Sit to/from stand ?  ?  ?  ?Functional mobility during ADLs: Min guard;Cueing for safety;Cueing for sequencing;Rolling walker (2 wheels) ?General ADL Comments: Pt only took 3-4 steps to [facility] with  FW ?  ? ?Extremity/Trunk Assessment Upper Extremity Assessment ?Upper Extremity Assessment: RUE deficits/detail;LUE deficits/detail ?RUE Deficits / Details: Pt noted due to pain limited in BUE due to guarding with any  activity ?LUE Deficits / Details: Pt noted due to pain limited in BUE due to guarding with any activity ?  ?Lower Extremity Assessment ?Lower Extremity Assessment: Defer to PT evaluation ?  ?  ?  ? ?Vision   ?  ?  ?Perception   ?  ?Praxis   ?  ? ?Cognition Arousal/Alertness: Awake/alert ?Behavior During Therapy: Anxious ?Overall Cognitive Status: No family/caregiver present to determine baseline cognitive functioning ?Area of Impairment: Safety/judgement ?  ?  ?  ?  ?  ?  ?  ?  ?Orientation Level: Situation, Disoriented to ?  ?Memory: Decreased short-term memory ?  ?Safety/Judgement: Decreased awareness of safety, Decreased awareness of deficits ?  ?Problem Solving: Slow processing ?General Comments: Pt agreeable to complete transfer to [facility] but did not want to complete further without max encouragement. Pt was premedicated prior to session. Pt reports pain medication only working for about 10 mins. ?  ?  ?   ?Exercises   ? ?  ?Shoulder Instructions   ? ? ?  ?General Comments    ? ? ?Pertinent Vitals/ Pain       Pain Assessment ?Pain Assessment: 0-10 ?Pain Score: 8  ?Breathing: normal ?Negative Vocalization: none ?Facial Expression: smiling or inexpressive ?Body Language: relaxed ?Consolability: no need to console ?PAINAD Score: 0 ?Pain Location: lower back ?Pain Descriptors / Indicators: Discomfort, Grimacing, Guarding ?Pain Intervention(s): Limited activity within patient's tolerance, Monitored during session, Repositioned, Premedicated before session ? ?Home Living   ?  ?  ?  ?  ?  ?  ?  ?  ?  ?  ?  ?  ?  ?  ?  ?  ?  ?  ? ?  ?Prior Functioning/Environment    ?  ?  ?  ?   ? ?Frequency ? Min 2X/week  ? ? ? ? ?  ?Progress Toward Goals ? ?OT Goals(current goals can now be found in the care plan section) ? Progress towards OT goals: Progressing toward goals ? ?Acute Rehab OT Goals ?Patient Stated Goal: to have less pain ?OT Goal Formulation: With patient ?Time For Goal Achievement: 08/20/21 ?Potential to Achieve Goals:  Good ?ADL Goals ?Pt Will Perform Grooming: sitting;with modified independence ?Pt Will Perform Lower Body Dressing: with modified independence;sitting/lateral leans;sit to/from stand ?Pt Will Transfer to Toilet: with modified independence;ambulating;regular height toilet ?Pt Will Perform Toileting - Clothing Manipulation and hygiene: with modified independence;with adaptive equipment;sitting/lateral leans;sit to/from stand  ?Plan Discharge plan remains appropriate   ? ?Co-evaluation ? ? ?   ?  ?  ?  ?  ? ?  ?AM-PAC OT "6 Clicks" Daily Activity     ?Outcome Measure ? ? Help from another person eating meals?: A Little ?Help from another person taking care of personal grooming?: A Little ?Help from another person toileting, which includes using toliet, bedpan, or urinal?: Total ?Help from another person bathing (including washing, rinsing, drying)?: Total ?Help from another person to put on and taking off regular upper body clothing?: A Little ?Help from another person to put on and taking off regular lower body clothing?: Total ?6 Click Score: 12 ? ?  ?End of Session Equipment Utilized During Treatment: Gait belt ? ?OT Visit Diagnosis: Muscle weakness (generalized) (M62.81);Pain ?Pain - Right/Left:  (back) ?  ?Activity Tolerance Patient limited  by pain ?  ?Patient Left in bed;with call bell/phone within reach;with bed alarm set ?  ?Nurse Communication Mobility status (pain level) ?  ? ?   ? ?Time: 1040-1107 ?OT Time Calculation (min): 27 min ? ?Charges: OT General Charges ?$OT Visit: 1 Visit ?OT Treatments ?$Self Care/Home Management : 23-37 mins ? ?Alphia MohKira Brittaney Beaulieu OTR/L  ?Acute Rehab Services  ?[phone] office number ?[phone] pager number ? ? ?Alphia MohKira  Yazlyn Wentzel ?08/15/2021, 11:15 AM ?

## 2021-08-15 NOTE — Progress Notes (Signed)
Pt refused lab drawn tonight. Phlebotomist will attempt later at am. ? ?Pt requested only dilaudid q 2-3 hours. She refused Oxycodone, ativan, and tylenol. Mobility is limited due to her lower back pain scale 8-9/10. She is hemodynamically stable, NSR on the monitor, afebrile, BP stable.  ?We will monitor.  ? ?Kennyth Lose, RN ?

## 2021-08-15 NOTE — Progress Notes (Signed)
At bedside to insert PICC, attempted x 2. PICC catheter threaded but unable to dropped to SVC despite of all troubleshooting done. As per recommendation, IR to place central line. RN aware. ?

## 2021-08-15 NOTE — Progress Notes (Signed)
PT Cancellation Note ? ?Patient Details ?Name: Latoya [name] ?MRN: [id number] ?DOB: Oct 30, 1985 ? ? ?Cancelled Treatment:    Reason Eval/Treat Not Completed: (P) Patient at procedure or test/unavailable (pt having PICC line placed. Did not have time to reattempt later in day.) ? ? ?[name] [name] ?08/15/2021, 5:13 PM ? ? ?

## 2021-08-15 NOTE — Progress Notes (Signed)
?PROGRESS NOTE ? ? ? ?Latoya [name]  ZOX:[id number] DOB: 07/08/1985 DOA: 08/02/2021 ?PCP: Pcp, No  ?Narrative 35/F with history of IV heroin abuse, history of MSSA tricuspid valve endocarditis in 20/20 treated with Ancef and anterior checked at [facility], history of MRSA discitis/osteomyelitis in 2021 treated with antibiotics presented to the ED with low back pain, cough, fevers.  MRI LS spine negative for infection ?-Found to have MRSA bacteremia with tricuspid valve vegetation, septic pulmonary emboli ?-TEE 4/4 noted large highly mobile 1.8 cm tricuspid valve vegetation with severe TR ?-TCTS consulted and plan was for angio [facility] 4/7, transferred to [facility] for surgery but unable to have angioVAC due to PFO ?-PICC line 4/11 ? ?Subjective: ?Coughing more today ? ?Assessment and Plan: ? ?MRSA tricuspid valve endocarditis ?Severe TR ?Severe sepsis, poa ?-Continue IV vancomycin ?-Repeat blood cultures 4/2 considered negative, staph epi likely contaminant ?-ID following ?-TCTS consulting, unable to have angiovac due: The transesophageal echocardiogram revealed a positive bubble study concerning for patent foramen ovale.  On further investigation there was a connection between the right and left atrium with flows from the right to left side.  I decided to abort the case due to concern for an embolic stroke.  (on prior TEE did not show PFO/shunt and she had angiovac in 2020 at [facility]-- does not appear bubble study was done in past) ?-will need 6 weeks of abx from 4/2 ?-PICC line 4/11 ? ?Cough, septic pulmonary emboli ?-Antibiotics as above, add guaifenesin, Tessalon Perles ? ?Hyponatremia ?-Improved ? ?Iron deficiency anemia ?Iron panel with serum iron of 22, transferrin saturation 7, ferritin 51 and TIBC 312. ?-s/p IV Fe ? ?IV drug abuse (HCC) ?-Counseled again ? ?Opioid use disorder ?Continue pain control with oxycodone 5 mg p.o. q4h PRN moderate pain and Dilaudid 1 mg IV q2h PRN severe pain ?-ibuprofen ? ?DVT  prophylaxis: xarelto (refusing lovenox) ?Code Status: Full code ?Family Communication: patient  ?Disposition Plan: Home after antibiotic therapy completed ? ?Consultants:  ?TCTS, infectious disease ? ? ? ?Objective: ?Vitals:  ? 08/15/21 0200 08/15/21 0423 08/15/21 0800 08/15/21 1125  ?BP: 111/80 (!) 128/96 107/76 (!) 122/98  ?Pulse:  (!) 107 97 (!) 106  ?Resp:  20 19 20   ?Temp:  (!) 97.4 ?F (36.3 ?C) 98.4 ?F (36.9 ?C) 98.8 ?F (37.1 ?C)  ?TempSrc:  Oral Oral Oral  ?SpO2:  100% 97% 100%  ?Weight:      ?Height:      ? ? ?Intake/Output Summary (Last 24 hours) at 08/15/2021 1227 ?Last data filed at 08/15/2021 1218 ?Gross per 24 hour  ?Intake 1500 ml  ?Output --  ?Net 1500 ml  ? ?Filed Weights  ? 08/03/21 1036 08/08/21 1111 08/09/21 1607  ?Weight: 82.2 kg 82.2 kg 87.5 kg  ? ? ?Examination: ? ? ? ?General: Appearance:    Obese female in no acute distress  ?   ?Lungs:      respirations unlabored, + dry cough  ?Heart:    Tachycardic.   ?MS:   All extremities are intact.  ?  ?Neurologic:   Awake, alert, oriented x 3  ?  ? ?Data Reviewed:  ? ?CBC: ?Recent Labs  ?Lab 08/10/21 ?0744 08/11/21 ?0627 08/11/21 ?1251 08/12/21 ?1100 08/14/21 ?0412 08/15/21 ?0913  ?WBC 13.3* 12.1*  --  15.3* 14.0* 10.9*  ?NEUTROABS  --   --   --   --  10.2*  --   ?HGB 7.8* 8.7* 8.5* 9.2* 7.9* 8.5*  ?HCT 25.0* 29.0* 25.0* 31.3* 25.9*  27.8*  ?MCV 74.4* 74.6*  --  76.9* 76.4* 76.8*  ?PLT 370 309  --  407* 329 265  ? ?Basic Metabolic Panel: ?Recent Labs  ?Lab 08/09/21 ?0659 08/10/21 ?0744 08/11/21 ?0627 08/11/21 ?1251 08/12/21 ?1100 08/14/21 ?[id number] 08/15/21 ?0913  ?NA 133* 135 135 139 137 133* 136  ?K 4.1 4.3 4.2 4.2 4.2 4.4 4.2  ?CL 104 107 107 105 107 104 106  ?CO2 23 21* 21*  --  23 23 24   ?GLUCOSE 120* 110* 117* 100* 130* 106* 98  ?BUN 19 10 11 9 10 12 11   ?CREATININE 0.62 0.56 0.61 0.40* 0.66 0.56 0.53  ?CALCIUM 7.8* 8.1* 8.4*  --  9.0 8.4* 8.6*  ?MG 2.3  --   --   --   --   --   --   ? ?GFR: ?Estimated Creatinine Clearance: 110.4 mL/min (by C-G formula  based on SCr of 0.53 mg/dL). ?Liver Function Tests: ?No results for input(s): AST, ALT, ALKPHOS, BILITOT, PROT, ALBUMIN in the last 168 hours. ? ?No results for input(s): LIPASE, AMYLASE in the last 168 hours. ?No results for input(s): AMMONIA in the last 168 hours. ?Coagulation Profile: ?No results for input(s): INR, PROTIME in the last 168 hours. ?Cardiac Enzymes: ?No results for input(s): CKTOTAL, CKMB, CKMBINDEX, TROPONINI in the last 168 hours. ?BNP (last 3 results) ?No results for input(s): PROBNP in the last 8760 hours. ?HbA1C: ?No results for input(s): HGBA1C in the last 72 hours. ?CBG: ?No results for input(s): GLUCAP in the last 168 hours. ?Lipid Profile: ?No results for input(s): CHOL, HDL, LDLCALC, TRIG, CHOLHDL, LDLDIRECT in the last 72 hours. ?Thyroid Function Tests: ?No results for input(s): TSH, T4TOTAL, FREET4, T3FREE, THYROIDAB in the last 72 hours. ?Anemia Panel: ?No results for input(s): VITAMINB12, FOLATE, FERRITIN, TIBC, IRON, RETICCTPCT in the last 72 hours. ?Urine analysis: ?   ?Component Value Date/Time  ? COLORURINE YELLOW 08/02/2021 2028  ? APPEARANCEUR HAZY (A) 08/02/2021 2028  ? LABSPEC 1.012 08/02/2021 2028  ? PHURINE 6.0 08/02/2021 2028  ? GLUCOSEU NEGATIVE 08/02/2021 2028  ? HGBUR SMALL (A) 08/02/2021 2028  ? BILIRUBINUR NEGATIVE 08/02/2021 2028  ? BILIRUBINUR negative 06/21/2021 1755  ? KETONESUR NEGATIVE 08/02/2021 2028  ? PROTEINUR 30 (A) 08/02/2021 2028  ? UROBILINOGEN 0.2 06/21/2021 1755  ? NITRITE NEGATIVE 08/02/2021 2028  ? LEUKOCYTESUR SMALL (A) 08/02/2021 2028  ? ? ? ? ?Recent Results (from the past 240 hour(s))  ?Culture, blood (routine x 2)     Status: None  ? Collection Time: 08/06/21 12:48 AM  ? Specimen: BLOOD  ?Result Value Ref Range Status  ? Specimen Description   Final  ?  BLOOD BLOOD LEFT HAND ?Performed at [facility], [address] [place], [place] [address] ?  ? Special Requests   Final  ?  BOTTLES DRAWN AEROBIC ONLY Blood Culture adequate  volume ?Performed at [facility], [address] [place], [place] [address] ?  ? Culture   Final  ?  NO GROWTH 5 DAYS ?Performed at [facility], [address] [place], [place] [address] ?  ? Report Status 08/11/2021 FINAL  Final  ?Culture, blood (routine x 2)     Status: Abnormal  ? Collection Time: 08/06/21 12:48 AM  ? Specimen: BLOOD  ?Result Value Ref Range Status  ? Specimen Description   Final  ?  BLOOD BLOOD RIGHT HAND ?Performed at [facility], [address] [place], [place] [address] ?  ?  Special Requests   Final  ?  BOTTLES DRAWN AEROBIC ONLY Blood Culture adequate volume ?Performed at [facility], [address] [place], [place] [address] ?  ? Culture  Setup Time   Final  ?  GRAM POSITIVE COCCI IN CLUSTERS ?AEROBIC BOTTLE ONLY ?CRITICAL VALUE NOTED.  VALUE IS CONSISTENT WITH PREVIOUSLY REPORTED AND CALLED VALUE. ?  ? Culture (A)  Final  ?  STAPHYLOCOCCUS EPIDERMIDIS ?THE SIGNIFICANCE OF ISOLATING THIS ORGANISM FROM A SINGLE SET OF BLOOD CULTURES WHEN MULTIPLE SETS ARE DRAWN IS UNCERTAIN. PLEASE NOTIFY THE MICROBIOLOGY DEPARTMENT WITHIN ONE WEEK IF SPECIATION AND SENSITIVITIES ARE REQUIRED. ?Performed at [facility], [address], [place], [place] [address] ?  ? Report Status 08/09/2021 FINAL  Final  ?  ? ?Radiology Studies: ?DG CHEST PORT 1 VIEW ? ?Result Date: 08/15/2021 ?CLINICAL DATA:  Cough. EXAM: PORTABLE CHEST 1 VIEW COMPARISON:  August 11, 2021.  August 04, 2021. FINDINGS: Stable cardiomediastinal silhouette. Multiple nodular densities are noted throughout both lungs, including cavitary nodule seen in right midlung. These are concerning for septic emboli. Bony thorax is unremarkable. IMPRESSION: Multiple nodular densities are noted through out both lungs, including cavitary nodule in right midlung, consistent with septic emboli. Electronically Signed   By: Lupita Raider M.D.   On: 08/15/2021 10:41  ? ?[place] EKG SITE  RITE ? ?Result Date: 08/15/2021 ?If [employer] not attached, placement could not be confirmed due to current cardiac rhythm.  ? ? ?Scheduled Meds: ? benzonatate  100 mg Oral TID  ? Chlorhexidine Gluconate Cl

## 2021-08-15 NOTE — Procedures (Addendum)
Successful placement of left dual lumen PICC line to brachial vein. ?Length 40 cm ?Tip at lower SVC/RA ?PICC capped ?No complications ?Ready for use. ? ?EBL < 5 mL ? ? ?Tyson Alias, AGNP ?08/15/2021 ?4:35 PM ? ?   ?

## 2021-08-15 NOTE — TOC Progression Note (Signed)
Transition of Care (TOC) - Progression Note  ? ? ?Patient Details  ?Name: Latoya [name] ?MRN: [id number] ?Date of Birth: [birth year] ? ?Transition of Care (TOC) CM/SW Contact  ?Eduard Roux, LCSW ?Phone Number: ?08/15/2021, 12:11 PM ? ?Clinical Narrative:    ? ?Received message from Unit Secretary, patient's Lawyer(Bell) called and requested verification of patient in the hospital to be faxed to [facility] [phone].  ? ?CSW faxed notice and copy placed on chart. ? ?Antony Blackbird, MSW, LCSW ?Clinical Social Worker ? ? ? ?Expected Discharge Plan: IP Rehab Facility ?Barriers to Discharge: Continued Medical Work up ? ?Expected Discharge Plan and Services ?Expected Discharge Plan: IP Rehab Facility ?In-house Referral: Clinical Social Work ?  ?  ?Living arrangements for the past 2 months: No permanent address ?                ?  ?  ?  ?  ?  ?  ?  ?  ?  ?  ? ? ?Social Determinants of Health (SDOH) Interventions ?  ? ?Readmission Risk Interventions ?   ? View : No data to display.  ?  ?  ?  ? ? ?

## 2021-08-16 DIAGNOSIS — I079 Rheumatic tricuspid valve disease, unspecified: Secondary | ICD-10-CM | POA: Diagnosis not present

## 2021-08-16 LAB — CBC WITH DIFFERENTIAL/PLATELET
Abs Immature Granulocytes: 0.08 10*3/uL — ABNORMAL HIGH (ref 0.00–0.07)
Basophils Absolute: 0.1 10*3/uL (ref 0.0–0.1)
Basophils Relative: 1 %
Eosinophils Absolute: 0.3 10*3/uL (ref 0.0–0.5)
Eosinophils Relative: 2 %
HCT: 28 % — ABNORMAL LOW (ref 36.0–46.0)
Hemoglobin: 8.7 g/dL — ABNORMAL LOW (ref 12.0–15.0)
Immature Granulocytes: 1 %
Lymphocytes Relative: 23 %
Lymphs Abs: 3 10*3/uL (ref 0.7–4.0)
MCH: 24 pg — ABNORMAL LOW (ref 26.0–34.0)
MCHC: 31.1 g/dL (ref 30.0–36.0)
MCV: 77.1 fL — ABNORMAL LOW (ref 80.0–100.0)
Monocytes Absolute: 0.8 10*3/uL (ref 0.1–1.0)
Monocytes Relative: 6 %
Neutro Abs: 8.6 10*3/uL — ABNORMAL HIGH (ref 1.7–7.7)
Neutrophils Relative %: 67 %
Platelets: 263 10*3/uL (ref 150–400)
RBC: 3.63 MIL/uL — ABNORMAL LOW (ref 3.87–5.11)
RDW: 23.4 % — ABNORMAL HIGH (ref 11.5–15.5)
Smear Review: NORMAL
WBC: 12.9 10*3/uL — ABNORMAL HIGH (ref 4.0–10.5)
nRBC: 0 % (ref 0.0–0.2)

## 2021-08-16 LAB — BASIC METABOLIC PANEL
Anion gap: 6 (ref 5–15)
BUN: 10 mg/dL (ref 6–20)
CO2: 27 mmol/L (ref 22–32)
Calcium: 8.6 mg/dL — ABNORMAL LOW (ref 8.9–10.3)
Chloride: 101 mmol/L (ref 98–111)
Creatinine, Ser: 0.58 mg/dL (ref 0.44–1.00)
GFR, Estimated: >60 mL/min (ref >60)
Glucose, Bld: 108 mg/dL — ABNORMAL HIGH (ref 70–99)
Potassium: 4 mmol/L (ref 3.5–5.1)
Sodium: 134 mmol/L — ABNORMAL LOW (ref 135–145)

## 2021-08-16 MED ORDER — BISACODYL 5 MG PO TBEC
5.0000 mg | DELAYED_RELEASE_TABLET | Freq: Every day | ORAL | Status: DC | PRN
  Administered 2021-08-24 – 2021-08-28 (×2): 5 mg via ORAL
  Filled 2021-08-16 (×2): qty 1

## 2021-08-16 MED ORDER — SENNOSIDES-DOCUSATE SODIUM 8.6-50 MG PO TABS
1.0000 | ORAL_TABLET | Freq: Every evening | ORAL | Status: DC | PRN
  Administered 2021-08-16: 1 via ORAL
  Filled 2021-08-16: qty 1

## 2021-08-16 NOTE — Progress Notes (Signed)
Physical Therapy Treatment ?Patient Details ?Name: Latoya [name] ?MRN: [id number] ?DOB: [birth year] ?Today's Date: 08/16/2021 ? ? ?History of Present Illness Latoya [name] is a 36 y.o. female admitted with back pain, hypotension, hyponatremia, leukocytosis and neutrophilia. Found to have bacteremia possible sepitc pulmonary emboli from endocarditis. Marland Kitchen PMH: IVDU, heroin abuse, ongoing. ? ?  ?PT Comments  ? ? Pt received in supine, agreeable to therapy session with encouragement, pt initially requesting bed-level session but with goal of getting to [facility] pt agreeable to attempt standing transfers and pre-gait tasks. Pt needing up to modA for sit<>stand and with poor tolerance for pivotal steps to [facility], pt defers gait trial in room due to severe back pain, pt tearful and tachy with elevated SBP sitting EOB. Pt also diaphoretic but not orthostatic. Pt defers sitting up in recliner, of note pt BP/HR improved once returned to supine. Pt continues to benefit from PT services to progress toward functional mobility goals.   ?Recommendations for follow up therapy are one component of a multi-disciplinary discharge planning process, led by the attending physician.  Recommendations may be updated based on patient status, additional functional criteria and insurance authorization. ? ?Follow Up Recommendations ? Acute inpatient rehab (3hours/day) (pending progress and pain control) ?  ?  ?Assistance Recommended at Discharge Frequent or constant Supervision/Assistance  ?Patient can return home with the following Two people to help with walking and/or transfers;A lot of help with bathing/dressing/bathroom ?  ?Equipment Recommendations ? Other (comment) (TBA)  ?  ?Recommendations for Other Services Rehab consult ? ? ?  ?Precautions / Restrictions Precautions ?Precautions: Fall ?Precaution Comments: Contact precs, monitor HR, pain management ?Restrictions ?Weight Bearing Restrictions: No  ?  ? ?Mobility ? Bed Mobility ?Overal bed  mobility: Needs Assistance ?Bed Mobility: Supine to Sit, Sit to Supine ?Rolling: Min assist ?  ?Supine to sit: Min assist, HOB elevated ?Sit to supine: Mod assist ?  ?General bed mobility comments: pt with too much pain with rolling to L side so returned to supine after partial roll, and HOB fully elevated, pt able to perform long sitting from elevated HOB then rotating hips around to sitting EOB ?  ? ?Transfers ?Overall transfer level: Needs assistance ?Equipment used: Rolling walker (2 wheels) ?Transfers: Sit to/from Stand ?Sit to Stand: From elevated surface, Min assist, Mod assist ?  ?Step pivot transfers: Min assist ?  ?  ? Lateral/Scoot Transfers: Min guard ?General transfer comment: pt apprehensive to attempt but agreeable to pivot to [facility] with increased encouragement/time; pt with difficulty lifting BLE to step due to pain and tending to shuffle with heavy BUE reliance on RW; pt sitting to [facility] prior to proximity needing hand over hand assist and cues for fall risk prevention; MGA seated scooting to [facility] ?  ? ?Ambulation/Gait ?  ?  ?  ?  ?  ?  ?Pre-gait activities: pivotal steps to/from [facility] ~102ft only, pt tearful and tachy/hypertensive ?  ? ? ? ?  ?Balance Overall balance assessment: Needs assistance ?Sitting-balance support: Feet supported ?Sitting balance-Leahy Scale: Good ?  ?  ?Standing balance support: Bilateral upper extremity supported ?Standing balance-Leahy Scale: Poor ?Standing balance comment: heavily reliant on RW due to pain ?  ?  ?  ?  ?  ?  ?  ?  ?  ?  ?  ?  ? ?  ?Cognition Arousal/Alertness: Awake/alert ?Behavior During Therapy: Anxious ?Overall Cognitive Status: No family/caregiver present to determine baseline cognitive functioning ?Area of Impairment: Safety/judgement, Problem solving ?  ?  ?  ?  ?  ?  ?  ?  ?  ?  ?  Memory: Decreased short-term memory ?  ?Safety/Judgement: Decreased awareness of safety, Decreased awareness of deficits ?  ?Problem Solving: Slow processing, Requires verbal  cues ?General Comments: Pt agreeable to complete transfer to [facility] with max encouragement; pt anxious throughout needing freq cues for pursed-lip breathing and activity pacing ?  ?  ? ?  ?Exercises Other Exercises ?Other Exercises: supine BLE AROM: ankle pumps, heel slides x5-10 reps ea ? ?  ?General Comments General comments (skin integrity, edema, etc.): HR elevated to 120's bpm with activity, SpO2 WFL, pt diaphoretic throughout, BP elevated sitting EOB (147/134 in RLE then dropped to 114/78 after return to supine). ?  ?  ? ?Pertinent Vitals/Pain Pain Assessment ?Pain Assessment: 0-10 ?Pain Score: 9  ?Pain Location: lower back and LE pain, L>R pain ?Pain Descriptors / Indicators: Discomfort, Grimacing, Guarding, Crying, Moaning ?Pain Intervention(s): Monitored during session, RN gave pain meds during session, Limited activity within patient's tolerance, Repositioned  ? ? ? ?PT Goals (current goals can now be found in the care plan section) Acute Rehab PT Goals ?PT Goal Formulation: With patient ?Time For Goal Achievement: 08/18/21 ?Progress towards PT goals: Progressing toward goals (slow progress) ? ?  ?Frequency ? ? ? Min 3X/week ? ? ? ?  ?PT Plan Current plan remains appropriate  ? ? ?   ?AM-PAC PT "6 Clicks" Mobility   ?Outcome Measure ? Help needed turning from your back to your side while in a flat bed without using bedrails?: A Lot (mod cues, increased time) ?Help needed moving from lying on your back to sitting on the side of a flat bed without using bedrails?: A Lot (mod cues) ?Help needed moving to and from a bed to a chair (including a wheelchair)?: A Lot ?Help needed standing up from a chair using your arms (e.g., wheelchair or bedside chair)?: A Lot (mod cues) ?Help needed to walk in hospital room?: Total ?Help needed climbing 3-5 steps with a railing? : Total ?6 Click Score: 10 ? ?  ?End of Session Equipment Utilized During Treatment: Gait belt ?Activity Tolerance: Patient limited by fatigue;Patient  limited by pain;Other (comment) (diaphoretic, elevated BP with sitting) ?Patient left: in bed;with call bell/phone within reach;with bed alarm set;Other (comment) (HOB elevated to 30 deg as she is drinking water; encouraged her to keep HOB up at least 1 hour then change position to supine/L/R for pressure relief) ?Nurse Communication: Mobility status;Other (comment) (elevated BP) ?PT Visit Diagnosis: Other abnormalities of gait and mobility (R26.89);Muscle weakness (generalized) (M62.81);Difficulty in walking, not elsewhere classified (R26.2) ?  ? ? ?Time: 4098-1191 ?PT Time Calculation (min) (ACUTE ONLY): 35 min ? ?Charges:  $Therapeutic Activity: 23-37 mins          ?          ? ?Kao Berkheimer P., PTA ?Acute Rehabilitation Services ?Secure Chat Preferred 9a-5:30pm ?Office: [phone] ? ? ?Brendaliz Kuk M Antionetta Ator ?08/16/2021, 5:01 PM ? ?

## 2021-08-16 NOTE — Progress Notes (Signed)
[facility] ?PROGRESS NOTE ? ?Latoya [name] OFB:[id number] DOB: Nov 20, 1985 DOA: 08/02/2021 ?PCP: Pcp, No ? ?Status  ?Remains inpatient appropriate because:  ?Unsafe discharge plan.  Patient is inappropriate given history of IVDA to discharge home with PICC line for prolonged antibiotic therapy ? ?Barriers to discharge: ?Social: ?Known history of IVDA ?Clinical: ?Requirement for prolonged IV antibiotic therapy for current endocarditis ? ?Level of care:  ?Telemetry Medical ? ? ?Code Status: Full ?Family Communication: Patient ?DVT prophylaxis: Xarelto ?COVID vaccination status: Vaccination status unknown ? ? ?HPI: ?35/F with history of IV heroin abuse, history of MSSA tricuspid valve endocarditis in 20/20 treated with Ancef and anterior checked at [facility], history of MRSA discitis/osteomyelitis in 2021 treated with antibiotics presented to the ED with low back pain, cough, fevers.  MRI LS spine negative for infection ?-Found to have MRSA bacteremia with tricuspid valve vegetation, septic pulmonary emboli ?-TEE 4/4 noted large highly mobile 1.8 cm tricuspid valve vegetation with severe TR ?-T CTS consulted, plan for angio [facility] 4/7, transferred to [facility] for surgery ? ?Subjective: ?Ending of back pain primarily while lying in the bed ? ?Objective: ?Vitals:  ? 08/16/21 0017 08/16/21 0432  ?BP: 121/89 113/65  ?Pulse: (!) 106 (!) 101  ?Resp: (!) 22 19  ?Temp: 99 ?F (37.2 ?C) 97.8 ?F (36.6 ?C)  ?SpO2: 93% 94%  ? ? ?Intake/Output Summary (Last 24 hours) at 08/16/2021 0757 ?Last data filed at 08/16/2021 0420 ?Gross per 24 hour  ?Intake 1437 ml  ?Output 500 ml  ?Net 937 ml  ? ?Filed Weights  ? 08/03/21 1036 08/08/21 1111 08/09/21 1607  ?Weight: 82.2 kg 82.2 kg 87.5 kg  ? ? ?Exam: ? ?Constitutional: NAD, calm, comfortable ?Respiratory: clear to auscultation bilaterally, no wheezing, no crackles. Normal respiratory effort. No accessory muscle use.  ?Cardiovascular: Regular rate and rhythm, no murmurs / rubs /  gallops. No extremity edema. 2+ pedal pulses. No carotid bruits.  ?Abdomen: no tenderness, no masses palpated. No hepatosplenomegaly. Bowel sounds positive.  ?Neurologic: CN 2-12 grossly intact. Sensation intact, DTR normal. Strength 5/5 x all 4 extremities.  ?Psychiatric: Normal judgment and insight. Alert and oriented x 3. Normal mood.  ? ? ?Assessment/Plan: ?MRSA tricuspid valve endocarditis/Severe TR/Severe sepsis, poa ?-Sepsis physiology resolved ?-Continue IV vancomycin through 5/13 ?-Repeat blood cultures 4/2 considered negative, staph epi likely contaminant ?-ID following; WBC 12,900 with normal neutrophils slightly elevated absolute neutrophils but trend is downward ?-Status post angio VAC by Dr. Kipp Brood on 4/7 ?  ?Cough, septic pulmonary emboli ?-Antibiotics as above, add guaifenesin, Tessalon Perles ?Stable on room air ?-Chest x-ray 4/11 with multiple nodular densities are noted through out both lungs, including cavitary nodule in right midlung, consistent with septic emboli. ? ?Hyponatremia ?-Improved, discontinued IV fluids ?-Essentially resolved with sodium averaging between 133 and 137 ?  ?Iron deficiency anemia ?Iron panel with serum iron of 22, transferrin saturation 7, ferritin 51 and TIBC 312. ?-Given IV iron and now on oral iron replacement ?  ?IV drug abuse  ?-Counseled by previous attending physician ?  ?Opioid use disorder ?Continue pain control with oxycodone 5 mg p.o. q4h PRN moderate pain and Dilaudid 1 mg IV q2h PRN severe pain ?Continue Cymbalta ? ? ? ?Data Reviewed: ?Basic Metabolic Panel: ?Recent Labs  ?Lab 08/11/21 ?0627 08/11/21 ?1251 08/12/21 ?1100 08/14/21 ?0412 08/15/21 ?7824 08/16/21 ?0230  ?NA 135 139 137 133* 136 134*  ?K 4.2 4.2 4.2 4.4 4.2 4.0  ?CL 107 105 107 104 106 101  ?CO2 21*  --  '23 23 24 27  ' ?GLUCOSE 117* 100* 130* 106* 98 108*  ?BUN '11 9 10 12 11 10  ' ?CREATININE 0.61 0.40* 0.66 0.56 0.53 0.58  ?CALCIUM 8.4*  --  9.0 8.4* 8.6* 8.6*  ? ?Liver Function Tests: ?No  results for input(s): AST, ALT, ALKPHOS, BILITOT, PROT, ALBUMIN in the last 168 hours. ?No results for input(s): LIPASE, AMYLASE in the last 168 hours. ?No results for input(s): AMMONIA in the last 168 hours. ?CBC: ?Recent Labs  ?Lab 08/11/21 ?0627 08/11/21 ?1251 08/12/21 ?1100 08/14/21 ?0412 08/15/21 ?2500 08/16/21 ?0230  ?WBC 12.1*  --  15.3* 14.0* 10.9* 12.9*  ?NEUTROABS  --   --   --  10.2*  --  8.6*  ?HGB 8.7* 8.5* 9.2* 7.9* 8.5* 8.7*  ?HCT 29.0* 25.0* 31.3* 25.9* 27.8* 28.0*  ?MCV 74.6*  --  76.9* 76.4* 76.8* 77.1*  ?PLT 309  --  407* 329 265 263  ? ?Cardiac Enzymes: ?No results for input(s): CKTOTAL, CKMB, CKMBINDEX, TROPONINI in the last 168 hours. ?BNP (last 3 results) ?No results for input(s): BNP in the last 8760 hours. ? ?ProBNP (last 3 results) ?No results for input(s): PROBNP in the last 8760 hours. ? ?CBG: ?No results for input(s): GLUCAP in the last 168 hours. ? ?No results found for this or any previous visit (from the past 240 hour(s)).  ? ?Studies: ?DG CHEST PORT 1 VIEW ? ?Result Date: 08/15/2021 ?CLINICAL DATA:  Cough. EXAM: PORTABLE CHEST 1 VIEW COMPARISON:  August 11, 2021.  August 04, 2021. FINDINGS: Stable cardiomediastinal silhouette. Multiple nodular densities are noted throughout both lungs, including cavitary nodule seen in right midlung. These are concerning for septic emboli. Bony thorax is unremarkable. IMPRESSION: Multiple nodular densities are noted through out both lungs, including cavitary nodule in right midlung, consistent with septic emboli. Electronically Signed   By: Marijo Conception M.D.   On: 08/15/2021 10:41  ? ?IR PICC PLACEMENT RIGHT >5 YRS INC IMG GUIDE ? ?Result Date: 08/15/2021 ?INDICATION: Patient with history of IV drug abuse requiring PICC for long-term antibiotics for MRSA tricuspid valve endocarditis. Request for PICC after IV team failed x2 at bedside. EXAM: ULTRASOUND AND FLUOROSCOPIC GUIDED PICC LINE INSERTION MEDICATIONS: One mL 1 % lidocaine CONTRAST:  None  FLUOROSCOPY TIME:  seconds (0.1 mGy) COMPLICATIONS: None immediate. TECHNIQUE: The procedure, risks, benefits, and alternatives were explained to the patient and informed written consent was obtained. The left upper extremity was prepped with chlorhexidine in a sterile fashion, and a sterile drape was applied covering the operative field. Maximum barrier sterile technique with sterile gowns and gloves were used for the procedure. A timeout was performed prior to the initiation of the procedure. Local anesthesia was provided with 1% lidocaine. After the overlying soft tissues were anesthetized with 1% lidocaine, a micropuncture kit was utilized to access the left brachial vein. Real-time ultrasound guidance was utilized for vascular access including the acquisition of a permanent ultrasound image documenting patency of the accessed vessel. A guidewire was advanced to the level of the superior caval-atrial junction for measurement purposes and the PICC line was cut to length. A peel-away sheath was placed and a 40 cm, 5 [place], dual lumen was inserted to level of the superior caval-atrial junction. A post procedure spot fluoroscopic was obtained. The catheter easily aspirated and flushed and was secured in place. A dressing was placed. The patient tolerated the procedure well without immediate post procedural complication. FINDINGS: After catheter placement, the tip lies within the superior cavoatrial  junction. The catheter aspirates and flushes normally and is ready for immediate use. IMPRESSION: Successful ultrasound and fluoroscopic guided placement of a left brachial vein approach, 40 cm, 5 French, dual lumen PICC with tip at the superior caval-atrial junction. The PICC line is ready for immediate use. Read by: Narda Rutherford, AGNP-BC Electronically Signed   By: Aletta Edouard M.D.   On: 08/15/2021 16:43  ? ?[place] EKG SITE RITE ? ?Result Date: 08/15/2021 ?If [employer] not attached, placement could not be confirmed  due to current cardiac rhythm.  ? ?Scheduled Meds: ? benzonatate  100 mg Oral TID  ? Chlorhexidine Gluconate Cloth  6 each Topical Daily  ? DULoxetine  40 mg Oral Daily  ? feeding supplement  1 Container Oral BID BM  ?

## 2021-08-16 NOTE — Progress Notes (Signed)
Nutrition Follow-up ? ?DOCUMENTATION CODES:  ? ?Not applicable ? ?INTERVENTION:  ? ?Continue ?Ensure Enlive po daily, each supplement provides 350 kcal and 20 grams of protein. ? ?Boost Breeze po BID, each supplement provides 250 kcal and 9 grams of protein ? ?Encourage PO intake at meals/encourage pt to order meal preferences  ? ?NUTRITION DIAGNOSIS:  ? ?Increased nutrient needs related to acute illness as evidenced by estimated needs. ?Ongoing.  ? ?GOAL:  ? ?Patient will meet greater than or equal to 90% of their needs ?Progressing.  ? ?MONITOR:  ? ?PO intake, Supplement acceptance, Labs, Weight trends ? ?REASON FOR ASSESSMENT:  ? ?Consult ?Assessment of nutrition requirement/status ? ?ASSESSMENT:  ? ?36 y.o. 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 She presented to the ED with severe low back pain for several weeks and intermittent fevers at home. She was admitted with severe sepsis and endocarditis of tricuspid valve. ? ?Spoke with RN. Pt refused Breakfast and Lunch yesterday but did eat dinner. She is consuming her PO supplements per RN.  ?Encourage pt to order pt preferences.  ?  ?4/4 s/p TEE due to MRSA bacteremia with tricuspid valve vegetation with septic pulmonary emboli, severe TR  ?4/7 s/p angio with no treatment; requires IV Abx ?4/11 s/p PICC placement  ? ? ?Medications reviewed and include: fergon, mag-ox, MVI with minerals, protonix, thiamine  ? ?Labs reviewed: Na 134 ? ? ?Diet Order:   ?Diet Order   ? ?       ?  Diet regular Room service appropriate? Yes; Fluid consistency: Thin  Diet effective now       ?  ? ?  ?  ? ?  ? ? ?EDUCATION NEEDS:  ? ?Not appropriate for education at this time ? ?Skin:  Skin Assessment: Reviewed RN Assessment ? ?Last BM:  4/9 ? ?Height:  ? ?Ht Readings from Last 1 Encounters:  ?08/08/21 5\' 6"  (1.676 m)  ? ? ?Weight:   ? ?Wt Readings from Last 1 Encounters:  ?08/09/21 87.5 kg  ? ? ?Ideal Body Weight:  59.1 kg ? ?BMI:  Body mass index is 31.14 kg/m?. ? ?Estimated Nutritional Needs:  ? ?Kcal:  2100-2400 kcal ? ?Protein:  105-120 grams ? ?Fluid:  >/= 2.3 L/day ? ?10/09/21., RD, LDN, CNSC ?See AMiON for contact information  ? ?

## 2021-08-16 NOTE — Progress Notes (Signed)
?   ? ? ? ? ?[facility] ? ?Date of Admission:  08/02/2021   Total days of inpatient antibiotics 9 ? ?Principal Problem: ?  Endocarditis of tricuspid valve ?Active Problems: ?  Iron deficiency anemia ?  Opioid use disorder ?  IV drug abuse (HCC) ?  History of bacterial endocarditis ?  Hyponatremia ?     ?    ?Assessment: ?35 YF transferred from [facility] for MRSA tricuspid valve endocarditis. She is currently on vancomycin. Transferred from [facility] for angiovac.  ?#MRSA native TV endocarditis complicated with septic pulmonary emboli with severe MR ?#IVDA ?#Tobacco abuse ?-Hx of MSSA TV endocarditis SP angiovac and treated with cefazolin in 2020 at [facility] ?-MRSA L4-L5 facet joint septic arthritis in 07/2019 ?-Hep B surface ag negative, HCV RNA negative, HIV negative ?-Angiovac on 4/7 was aborted as evidence of PFO noted ?Recommendations: ?-Continue vancomycin ?-Restart the clock on 6 weeks of antibiotics from negative Cx EOT 5/13 ?-Pt is not a home IV antibiotics candidate. It appears she will completing antibiotics inpatient ?-ID appt scheduled on 5/15 with myself ?Microbiology:   ?Antibiotics: ?Vancomycin 3/29-p ?Cefepime 3/29-3/30 ?Metronidazole 3/29-3/30 ? ?Cultures: ?Blood ?3/21 1/1 MRSA ?4/2 1/2 staph epi ? ? ? ?SUBJECTIVE: ?Resting in bed on RA. No new complaints.She is amenable staying in patient/SNF to complete IV antibiotics.  ? ?Review of Systems: ?Review of Systems  ?All other systems reviewed and are negative. ? ? ?Scheduled Meds: ? benzonatate  100 mg Oral TID  ? Chlorhexidine Gluconate Cloth  6 each Topical Daily  ? DULoxetine  40 mg Oral Daily  ? feeding supplement  1 Container Oral BID BM  ? feeding supplement  237 mL Oral Q24H  ? ferrous gluconate  324 mg Oral Q breakfast  ? magnesium oxide  400 mg Oral BID  ? multivitamin with minerals  1 tablet Oral Daily  ? nicotine  21 mg Transdermal Daily  ? pantoprazole  40 mg Oral Daily  ? rivaroxaban  10 mg Oral Q supper  ? thiamine  100  mg Oral Daily  ? ?Continuous Infusions: ? lactated ringers Stopped (08/11/21 1336)  ? vancomycin 1,000 mg (08/16/21 3888)  ? ?PRN Meds:.acetaminophen, albuterol, guaiFENesin-dextromethorphan, HYDROmorphone (DILAUDID) injection, ibuprofen, lidocaine, LORazepam, menthol-cetylpyridinium, oxyCODONE ?Allergies  ?Allergen Reactions  ? Naltrexone Anxiety  ?  Severe anxiety, restlessness, vomiting  ? ? ?OBJECTIVE: ?Vitals:  ? 08/15/21 2010 08/16/21 0017 08/16/21 0432 08/16/21 0800  ?BP: 126/85 121/89 113/65 129/78  ?Pulse: (!) 103 (!) 106 (!) 101 98  ?Resp: (!) 22 (!) 22 19 19   ?Temp: 99.3 ?F (37.4 ?C) 99 ?F (37.2 ?C) 97.8 ?F (36.6 ?C) 98.1 ?F (36.7 ?C)  ?TempSrc: Oral Oral Oral Oral  ?SpO2: 95% 93% 94% 96%  ?Weight:      ?Height:      ? ?Body mass index is 31.14 kg/m?. ? ?Physical Exam ?Constitutional:   ?   Appearance: Normal appearance.  ?HENT:  ?   Head: Normocephalic and atraumatic.  ?   Right Ear: Tympanic membrane normal.  ?   Left Ear: Tympanic membrane normal.  ?   Nose: Nose normal.  ?   Mouth/Throat:  ?   Mouth: Mucous membranes are moist.  ?Eyes:  ?   Extraocular Movements: Extraocular movements intact.  ?   Conjunctiva/sclera: Conjunctivae normal.  ?   Pupils: Pupils are equal, round, and reactive to light.  ?Cardiovascular:  ?   Rate and Rhythm: Normal rate and regular rhythm.  ?  Heart sounds: No murmur heard. ?  No friction rub. No gallop.  ?Pulmonary:  ?   Effort: Pulmonary effort is normal.  ?   Breath sounds: Normal breath sounds.  ?Abdominal:  ?   General: Abdomen is flat.  ?   Palpations: Abdomen is soft.  ?Musculoskeletal:     ?   General: Normal range of motion.  ?Skin: ?   General: Skin is warm and dry.  ?Neurological:  ?   General: No focal deficit present.  ?   Mental Status: She is alert and oriented to person, place, and time.  ?Psychiatric:     ?   Mood and Affect: Mood normal.  ? ? ? ? ?Lab Results ?Lab Results  ?Component Value Date  ? WBC 12.9 (H) 08/16/2021  ? HGB 8.7 (L) 08/16/2021  ? HCT  28.0 (L) 08/16/2021  ? MCV 77.1 (L) 08/16/2021  ? PLT 263 08/16/2021  ?  ?Lab Results  ?Component Value Date  ? CREATININE 0.58 08/16/2021  ? BUN 10 08/16/2021  ? NA 134 (L) 08/16/2021  ? K 4.0 08/16/2021  ? CL 101 08/16/2021  ? CO2 27 08/16/2021  ?  ?Lab Results  ?Component Value Date  ? ALT 15 08/04/2021  ? AST 30 08/04/2021  ? ALKPHOS 60 08/04/2021  ? BILITOT 0.7 08/04/2021  ?  ? ? ? ? ?Danelle Earthly, MD ?[facility] ?[facility] ?08/16/2021, 10:53 AM  ?

## 2021-08-17 DIAGNOSIS — I079 Rheumatic tricuspid valve disease, unspecified: Secondary | ICD-10-CM | POA: Diagnosis not present

## 2021-08-17 MED ORDER — HYDROMORPHONE HCL 1 MG/ML IJ SOLN
1.0000 mg | INTRAMUSCULAR | Status: DC | PRN
  Administered 2021-08-17 – 2021-08-21 (×37): 1 mg via INTRAVENOUS
  Filled 2021-08-17 (×38): qty 1

## 2021-08-17 MED ORDER — METHOCARBAMOL 500 MG PO TABS
750.0000 mg | ORAL_TABLET | Freq: Four times a day (QID) | ORAL | Status: DC
  Administered 2021-08-17 – 2021-08-21 (×17): 750 mg via ORAL
  Filled 2021-08-17 (×18): qty 2

## 2021-08-17 MED ORDER — IBUPROFEN 600 MG PO TABS
600.0000 mg | ORAL_TABLET | Freq: Four times a day (QID) | ORAL | Status: DC
  Administered 2021-08-17 – 2021-08-25 (×33): 600 mg via ORAL
  Filled 2021-08-17 (×33): qty 1

## 2021-08-17 MED ORDER — OXYCODONE HCL 5 MG PO TABS
5.0000 mg | ORAL_TABLET | ORAL | Status: DC | PRN
  Administered 2021-08-17 – 2021-08-21 (×7): 5 mg via ORAL
  Filled 2021-08-17 (×8): qty 1

## 2021-08-17 MED ORDER — PREGABALIN 25 MG PO CAPS
25.0000 mg | ORAL_CAPSULE | Freq: Three times a day (TID) | ORAL | Status: DC
  Administered 2021-08-17 – 2021-08-21 (×13): 25 mg via ORAL
  Filled 2021-08-17 (×13): qty 1

## 2021-08-17 NOTE — Progress Notes (Signed)
PT Cancellation Note ? ?Patient Details ?Name: Latoya [name] ?MRN: [id number] ?DOB: 08-21-1985 ? ? ?Cancelled Treatment:    Reason Eval/Treat Not Completed: (P) Patient declined, no reason specified (pt refusing OOB/EOB/supine exercise and mobility tasks. Discussed risks of immobility/benefits of mobility, pt continues to refuse. Recently premedicated with dilaudid and muscle relaxer.) Will continue efforts next date per PT plan of care as schedule permits. ? ? ?Blas Riches M Kerby Hockley ?08/17/2021, 4:06 PM ? ? ?

## 2021-08-17 NOTE — Progress Notes (Signed)
[facility] ?PROGRESS NOTE ? ?Latoya [name] KYH:[id number] DOB: 05-18-1985 DOA: 08/02/2021 ?PCP: Pcp, No ? ?Status  ?Remains inpatient appropriate because:  ?Unsafe discharge plan.  Patient is inappropriate given history of IVDA to discharge home with PICC line for prolonged antibiotic therapy ? ?Barriers to discharge: ?Social: ?Known history of IVDA ?Clinical: ?Requirement for prolonged IV antibiotic therapy for current endocarditis ? ?Level of care:  ?Telemetry Medical ? ? ?Code Status: Full ?Family Communication: Patient ?DVT prophylaxis: Xarelto ?COVID vaccination status: Vaccination status unknown ? ? ?HPI: ?35/F with history of IV heroin abuse, history of MSSA tricuspid valve endocarditis in 20/20 treated with Ancef and anterior checked at [facility], history of MRSA discitis/osteomyelitis in 2021 treated with antibiotics presented to the ED with low back pain, cough, fevers.  MRI LS spine negative for infection ?-Found to have MRSA bacteremia with tricuspid valve vegetation, septic pulmonary emboli ?-TEE 4/4 noted large highly mobile 1.8 cm tricuspid valve vegetation with severe TR ?-T CTS consulted, plan for angio [facility] 4/7, transferred to [facility] for surgery ?-4/7 taken to the OR for angio VAC but procedure had to be aborted due to PFO ? ?Subjective: ?Complaining of very severe back pain.  Of note patient has had back pain since her initial episode of lumbar osteomyelitis in 2021.  Patient primarily using IV Dilaudid which I cautioned her against since adequate pain relief will not be obtained with IV pain medications and these are usually reserved for patients who are unable to take oral pain medication such as patient having recurrent nausea and vomiting. ? ?Objective: ?Vitals:  ? 08/17/21 0340 08/17/21 0800  ?BP: 121/76 106/82  ?Pulse: (!) 104 (!) 104  ?Resp: (!) 24 18  ?Temp: 98.4 ?F (36.9 ?C) 98.3 ?F (36.8 ?C)  ?SpO2: 98% 96%  ? ? ?Intake/Output Summary (Last 24 hours) at 08/17/2021 0811 ?Last  data filed at 08/17/2021 0654 ?Gross per 24 hour  ?Intake 870 ml  ?Output 1000 ml  ?Net -130 ml  ? ?Filed Weights  ? 08/03/21 1036 08/08/21 1111 08/09/21 1607  ?Weight: 82.2 kg 82.2 kg 87.5 kg  ? ? ?Exam: ? ?Constitutional: NAD, anxious and very uncomfortable, asking nurse when her next pain medication will be and nurses repeatedly told her at 10:15 AM ?Respiratory: clear to auscultation bilaterally, no wheezing, no crackles. Normal respiratory effort. No accessory muscle use.  ?Cardiovascular: Regular rate and rhythm, no murmurs / rubs / gallops. No extremity edema. 2+ pedal pulses. No carotid bruits.  ?Abdomen: no tenderness, no masses palpated. No hepatosplenomegaly. Bowel sounds positive.  ?Musculoskeletal: Patient reporting severe back pain but was unable to participate with exam enough for me to palpate her paraspinous muscles or over her spinal column ?Neurologic: CN 2-12 grossly intact. Sensation intact, DTR normal. Strength 5/5 x all 4 extremities.  ?Psychiatric: Normal judgment and insight. Alert and oriented x 3. Normal mood.  ? ? ?Assessment/Plan: ?MRSA tricuspid valve endocarditis/Severe TR/Severe sepsis, poa ?-Sepsis physiology resolved ?-Continue IV vancomycin through 5/13 ?-Repeat blood cultures 4/2 considered negative, staph epi likely contaminant ?-ID following; WBC 12,900 with normal neutrophils slightly elevated absolute neutrophils but trend is downward ?-Angio vac attempted on 4/7 but was aborted due to PFO ?-We will likely need follow-up echocardiogram prior to discharge at least 5 to 7 days before antibiotics are completed on 5/13-patient appears to be a poor candidate for follow-up so would like to complete this before discharge ? ?History of lumbar osteomyelitis/persistent lumbar back pain/edition ? -Patient initially presented with severe lumbar back  pain which has been present for 2 years since her initial episode of osteomyelitis.  When asked if this is one of the reason she has continue  to use drugs especially narcotics she stated yes ?-MRI completed at time of this admission demonstrated no findings suspicious for discitis or osteomyelitis ?-She does continue to have intermittent low-grade leukocytosis without fever. ?-At this time do not suspect new infection in lumbar spine but will continue to follow ?-We will clarify pain medications as Oxy IR to be used first for moderate pain and IV Dilaudid to be used for breakthrough pain ?-Add Lyrica 25 mg 3 times daily, Robaxin 750 mg 4 times daily, change Advil to scheduled every 6 hours and continue protonix ?-Hopefully when pain improves she will be able to participate with therapy more effectively ? ?Cough, septic pulmonary emboli ?-Antibiotics as above, add guaifenesin, Tessalon Perles ?Stable on room air ?-Chest x-ray 4/11 with multiple nodular densities are noted through out both lungs, including cavitary nodule in right midlung, consistent with septic emboli. ? ?Hyponatremia ?-Essentially resolved with sodium averaging between 133 and 137 ?  ?Iron deficiency anemia ?Iron panel with serum iron of 22, transferrin saturation 7, ferritin 51 and TIBC 312. ?-Given IV iron and now on oral iron replacement ?  ?IV drug abuse  ?-Counseled by previous attending physician ?  ?Opioid use disorder ?Continue pain control with oxycodone 5 mg p.o. q4h PRN moderate pain and Dilaudid 1 mg IV q2h PRN severe pain ?See above regarding the addition of Lyrica, Robaxin ?Continue Cymbalta ? ? ? ?Data Reviewed: ?Basic Metabolic Panel: ?Recent Labs  ?Lab 08/11/21 ?0627 08/11/21 ?1251 08/12/21 ?1100 08/14/21 ?0412 08/15/21 ?7253 08/16/21 ?0230  ?NA 135 139 137 133* 136 134*  ?K 4.2 4.2 4.2 4.4 4.2 4.0  ?CL 107 105 107 104 106 101  ?CO2 21*  --  23 23 24 27   ?GLUCOSE 117* 100* 130* 106* 98 108*  ?BUN 11 9 10 12 11 10   ?CREATININE 0.61 0.40* 0.66 0.56 0.53 0.58  ?CALCIUM 8.4*  --  9.0 8.4* 8.6* 8.6*  ? ?Liver Function Tests: ?No results for input(s): AST, ALT, ALKPHOS,  BILITOT, PROT, ALBUMIN in the last 168 hours. ?No results for input(s): LIPASE, AMYLASE in the last 168 hours. ?No results for input(s): AMMONIA in the last 168 hours. ?CBC: ?Recent Labs  ?Lab 08/11/21 ?0627 08/11/21 ?1251 08/12/21 ?1100 08/14/21 ?0412 08/15/21 ?10/14/21 08/16/21 ?0230  ?WBC 12.1*  --  15.3* 14.0* 10.9* 12.9*  ?NEUTROABS  --   --   --  10.2*  --  8.6*  ?HGB 8.7* 8.5* 9.2* 7.9* 8.5* 8.7*  ?HCT 29.0* 25.0* 31.3* 25.9* 27.8* 28.0*  ?MCV 74.6*  --  76.9* 76.4* 76.8* 77.1*  ?PLT 309  --  407* 329 265 263  ? ?Cardiac Enzymes: ?No results for input(s): CKTOTAL, CKMB, CKMBINDEX, TROPONINI in the last 168 hours. ?BNP (last 3 results) ?No results for input(s): BNP in the last 8760 hours. ? ?ProBNP (last 3 results) ?No results for input(s): PROBNP in the last 8760 hours. ? ?CBG: ?No results for input(s): GLUCAP in the last 168 hours. ? ?No results found for this or any previous visit (from the past 240 hour(s)).  ? ?Studies: ?DG CHEST PORT 1 VIEW ? ?Result Date: 08/15/2021 ?CLINICAL DATA:  Cough. EXAM: PORTABLE CHEST 1 VIEW COMPARISON:  August 11, 2021.  August 04, 2021. FINDINGS: Stable cardiomediastinal silhouette. Multiple nodular densities are noted throughout both lungs, including cavitary nodule seen in right midlung. These  are concerning for septic emboli. Bony thorax is unremarkable. IMPRESSION: Multiple nodular densities are noted through out both lungs, including cavitary nodule in right midlung, consistent with septic emboli. Electronically Signed   By: Lupita RaiderJames  Green Jr M.D.   On: 08/15/2021 10:41  ? ?IR PICC PLACEMENT RIGHT >5 YRS INC IMG GUIDE ? ?Result Date: 08/15/2021 ?INDICATION: Patient with history of IV drug abuse requiring PICC for long-term antibiotics for MRSA tricuspid valve endocarditis. Request for PICC after IV team failed x2 at bedside. EXAM: ULTRASOUND AND FLUOROSCOPIC GUIDED PICC LINE INSERTION MEDICATIONS: One mL 1 % lidocaine CONTRAST:  None FLUOROSCOPY TIME:  seconds (0.1 mGy)  COMPLICATIONS: None immediate. TECHNIQUE: The procedure, risks, benefits, and alternatives were explained to the patient and informed written consent was obtained. The left upper extremity was prepped with chlorhexidin

## 2021-08-17 NOTE — TOC Progression Note (Signed)
Transition of Care (TOC) - Progression Note  ? ? ?Patient Details  ?Name: Royann Wildasin ?MRN: [id number] ?Date of Birth: 1986/01/09 ? ?Transition of Care (TOC) CM/SW Contact  ?Curlene Labrum, RN ?Phone Number: ?08/17/2021, 11:31 AM ? ?Clinical Narrative:    ?CM met with the patient at the bedside to discuss transitions of care.  The patient remains on IV antbiotics - Vancomycin IV per ID Medicine MD through 09/16/2021 and is unable to return home with IV antibiotics due to current history of IV drug abuse - drug abuse including Heroine and Ice per the patient for the past 4 years.  The patient denies alcohol use at this time.  The patient was given Outpatient follow up information for substance abuse including resources.  The patient plans to follow up with [facility] in [facility], [place] and plans to speak with her mother, Jamille Fisher to possibly return to her parents home after discharge from the hospital.  The patient states that she was living with a friend, Merry Proud when she was admitted to the hospital. ? ?CM spoke with Danne Baxter, CM with CIR and they are unable to offer a bed to the patient and have signed off following the patient at the time.  I called and spoke with Bufford Buttner, CM at [facility] and they are unable to offer a bed to the patient - 4 Medicaid beds are available at the facility and these beds are reserved for [facility] at this time.  I sent a message to [place], PT explaining that Inpatient beds are unavailable to the patient.  The patient may need referral to [facility] for Outpatient therapy closer to discharge since beds a inpatient rehab facilities are not available. ? ?I called and schedule a PCP follow up with [facility] - scheduled for Friday, Sep 22, 2021 at 1430. ? ?CM and MSW with DTP Team will continue to follow the patient for discharge planning needs. ? ? ? ? ?Expected Discharge Plan: [place] ?Barriers to Discharge: Continued  Medical Work up (Requiring IV antibiotics through 09/16/2021, Hx IVDU) ? ?Expected Discharge Plan and Services ?Expected Discharge Plan: [place] ?In-house Referral: Clinical Social Work, PCP / Psychologist, educational ?Discharge Planning Services: CM Consult, Follow-up appt scheduled ?  ?Living arrangements for the past 2 months: No permanent address ?                ?  ?  ?  ?  ?  ?  ?  ?  ?  ?  ? ? ?Social Determinants of Health (SDOH) Interventions ?  ? ?Readmission Risk Interventions ? ?  08/17/2021  ? 11:31 AM  ?Readmission Risk Prevention Plan  ?Transportation Screening Complete  ?PCP or Specialist Appt within 5-7 Days Complete  ?Home Care Screening Complete  ?Medication Review (RN CM) Complete  ? ? ?

## 2021-08-18 DIAGNOSIS — I079 Rheumatic tricuspid valve disease, unspecified: Secondary | ICD-10-CM | POA: Diagnosis not present

## 2021-08-18 NOTE — Progress Notes (Signed)
PT Cancellation Note ? ?Patient Details ?Name: Tanajah Boulter ?MRN: [id number] ?DOB: 02/28/1986 ? ? ?Cancelled Treatment:    Reason Eval/Treat Not Completed: Pain limiting ability to participate;Patient declined, no reason specified. Attempted 2x, but pt declining OOB mobility, sitting EOB, and bed-level exercises secondary to pain, even when coordinating with RN to pre-medicate pt. Educated pt on importance of mobility to reduce stiffness and prevent deconditioning, she verbalized understanding and that she was performing her HEP. Pt agreed to get OOB this weekend with nursing and get up with PT next time. If pt is unable to go to an inpatient rehab facility, she would likely benefit from short-term rehab at a SNF. Will follow-up another day as able. ? ? ?Raymond Gurney, PT, DPT ?Acute Rehabilitation Services  ?Pager: [phone] ?Office: [phone] ? ? ? ?Henrene Dodge Pettis ?08/18/2021, 2:02 PM ? ? ?

## 2021-08-18 NOTE — Progress Notes (Signed)
Orthopedic Tech Progress Note ?Patient Details:  ?Latoya [name] ?1985-11-28 ?TQ:[id number] ? ?Ortho Devices ?Type of Ortho Device: Lumbar corsett ?Ortho Device/Splint Interventions: Ordered ?  ?  ? ?Ladislaus Repsher A [employer] ?08/18/2021, 11:16 AM ? ?

## 2021-08-18 NOTE — Progress Notes (Signed)
Occupational Therapy Treatment ?Patient Details ?Name: Latoya [name] ?MRN: [id number] ?DOB: August 13, 1985 ?Today's Date: 08/18/2021 ? ? ?History of present illness Nusaiba Guallpa is a 36 y.o. female admitted with back pain, hypotension, hyponatremia, leukocytosis and neutrophilia. Found to have bacteremia possible sepitc pulmonary emboli from endocarditis. Marland Kitchen PMH: IVDU, heroin abuse, ongoing. ?  ?OT comments ? Pt. Seen for skilled OT session.  Pt. Able to complete bed mobility in/out of bed with implementation of self guiding tech. For pt. To manage pain during movement.  Able to guide BLEs into without physical assistance.  Pivotal steps to bsc with cueing for safety and sequencing.  Continue to progress adls and activity tolerance next session as pt. Able.    ? ?Recommendations for follow up therapy are one component of a multi-disciplinary discharge planning process, led by the attending physician.  Recommendations may be updated based on patient status, additional functional criteria and insurance authorization. ?   ?Follow Up Recommendations ? Acute inpatient rehab (3hours/day)  ?  ?Assistance Recommended at Discharge Frequent or constant Supervision/Assistance  ?Patient can return home with the following ? A lot of help with bathing/dressing/bathroom;Two people to help with walking and/or transfers;Assistance with cooking/housework;Direct supervision/assist for medications management;Assist for transportation;Help with stairs or ramp for entrance;Direct supervision/assist for financial management ?  ?Equipment Recommendations ?    ?  ?Recommendations for Other Services   ? ?  ?Precautions / Restrictions Precautions ?Precautions: Fall ?Precaution Comments: Contact precs, monitor HR, pain management  ? ? ?  ? ?Mobility Bed Mobility ?Overal bed mobility: Needs Assistance ?Bed Mobility: Supine to Sit, Sit to Supine ?  ?  ?Supine to sit: Supervision, HOB elevated ?Sit to supine: Supervision, HOB elevated ?  ?[name] bed  mobility comments: pt. declined log roll states hob elevated is fine because she often sleeps in a relciner so it is similiar to home env. educated on managment of her own legs to better manage pain.  pt. able to sit eob and lift and guide each le into the bed without physical assistance ?  ? ?Transfers ?  ?  ?  ?  ?  ?  ?  ?  ?  ?  ?  ?  ?Balance   ?  ?  ?  ?  ?  ?  ?  ?  ?  ?  ?  ?  ?  ?  ?  ?  ?  ?  ?   ? ?ADL either performed or assessed with clinical judgement  ? ?ADL Overall ADL's : Needs assistance/impaired ?  ?  ?  ?  ?  ?  ?  ?  ?Upper Body Dressing : Set up;Minimal assistance ?Upper Body Dressing Details (indicate cue type and reason): doff and don gown eob, assistance for tying it ?  ?  ?Toilet Transfer: Min guard;Cueing for safety;Cueing for sequencing;Rolling walker (2 wheels);BSC/3in1 ?Toilet Transfer Details (indicate cue type and reason): able to transfer eob with several pivotal steps to bsc ?Toileting- Architect and Hygiene: Min guard;Sit to/from stand ?  ?  ?  ?  ?  ?  ? ?Extremity/Trunk Assessment   ?  ?  ?  ?  ?  ? ?Vision   ?  ?  ?Perception   ?  ?Praxis   ?  ? ?Cognition Arousal/Alertness: Awake/alert ?Behavior During Therapy: [facility] for tasks assessed/performed, Flat affect ?Overall Cognitive Status: No family/caregiver present to determine baseline cognitive functioning ?  ?  ?  ?  ?  ?  ?  ?  ?  ?  ?  ?  ?  ?  ?  ?  ?  ?  ?  ?   ?  Exercises   ? ?  ?Shoulder Instructions   ? ? ?  ?[name] Comments    ? ? ?Pertinent Vitals/ Pain       Pain Assessment ?Pain Assessment: Faces ?Faces Pain Scale: Hurts little more ?Pain Location: lower back ?Pain Descriptors / Indicators: Grimacing ?Pain Intervention(s): Limited activity within patient's tolerance, Monitored during session, Repositioned ? ?Home Living   ?  ?  ?  ?  ?  ?  ?  ?  ?  ?  ?  ?  ?  ?  ?  ?  ?  ?  ? ?  ?Prior Functioning/Environment    ?  ?  ?  ?   ? ?Frequency ? Min 2X/week  ? ? ? ? ?  ?Progress Toward Goals ? ?OT Goals(current  goals can now be found in the care plan section) ? Progress towards OT goals: Progressing toward goals ? ?   ?Plan Discharge plan remains appropriate   ? ?Co-evaluation ? ? ?   ?  ?  ?  ?  ? ?  ?AM-PAC OT "6 Clicks" Daily Activity     ?Outcome Measure ? ? Help from another person eating meals?: A Little ?Help from another person taking care of personal grooming?: A Little ?Help from another person toileting, which includes using toliet, bedpan, or urinal?: Total ?Help from another person bathing (including washing, rinsing, drying)?: Total ?Help from another person to put on and taking off regular upper body clothing?: A Little ?Help from another person to put on and taking off regular lower body clothing?: Total ?6 Click Score: 12 ? ?  ?End of Session Equipment Utilized During Treatment: Rolling walker (2 wheels) ? ?OT Visit Diagnosis: Muscle weakness (generalized) (M62.81);Pain ?  ?Activity Tolerance Patient limited by pain ?  ?Patient Left in bed;with call bell/phone within reach;with bed alarm set (all 4 rails up as they were all up upon arrival and pt. states they were all in up position also) ?  ?Nurse Communication   ?  ? ?   ? ?Time: 9528-4132 ?OT Time Calculation (min): 23 min ? ?Charges: OT [name] Charges ?$OT Visit: 1 Visit ? ?Boneta Lucks, COTA/L ?Acute Rehabilitation ?[phone]  ? ?Salvadore Oxford ?08/18/2021, 12:08 PM ?

## 2021-08-18 NOTE — Plan of Care (Signed)

## 2021-08-18 NOTE — Progress Notes (Signed)
[facility] ?PROGRESS NOTE ? ?Latoya [name] HWE:[id number] DOB: [birth year] DOA: 08/02/2021 ?PCP: Pcp, No ? ?Status  ?Remains inpatient appropriate because:  ?Unsafe discharge plan.  Patient is inappropriate given history of IVDA to discharge home with PICC line for prolonged antibiotic therapy ? ?Barriers to discharge: ?Social: ?Known history of IVDA ?Clinical: ?Requirement for prolonged IV antibiotic therapy for current endocarditis ? ?Level of care:  ?Telemetry Medical ? ? ?Code Status: Full ?Family Communication: Patient ?DVT prophylaxis: Xarelto ?COVID vaccination status: Vaccination status unknown ? ? ?HPI: ?35/F with history of IV heroin abuse, history of MSSA tricuspid valve endocarditis in 20/20 treated with Ancef and anterior checked at [facility], history of MRSA discitis/osteomyelitis in 2021 treated with antibiotics presented to the ED with low back pain, cough, fevers.  MRI LS spine negative for infection ?-Found to have MRSA bacteremia with tricuspid valve vegetation, septic pulmonary emboli ?-TEE 4/4 noted large highly mobile 1.8 cm tricuspid valve vegetation with severe TR ?-T CTS consulted, plan for angio [facility] 4/7, transferred to [facility] for surgery ?-4/7 taken to the OR for angio VAC but procedure had to be aborted due to PFO ? ?Subjective: ?Sleeping and awakened when I entered room.  Reported pain significantly improved.  Still having pain with mobility.  Discussed that maybe she would be appropriate for a corset brace.  Told her I would ask [employer] to fit her.  She was amenable to this plan. ? ?Objective: ?Vitals:  ? 08/18/21 0654 08/18/21 0655  ?BP:    ?Pulse:    ?Resp: (!) 21 19  ?Temp:    ?SpO2:    ? ? ?Intake/Output Summary (Last 24 hours) at 08/18/2021 0746 ?Last data filed at 08/18/2021 0525 ?Gross per 24 hour  ?Intake 520.8 ml  ?Output 1400 ml  ?Net -879.2 ml  ? ?Filed Weights  ? 08/03/21 1036 08/08/21 1111 08/09/21 1607  ?Weight: 82.2 kg 82.2 kg 87.5 kg   ? ? ?Exam: ? ?Constitutional: NAD, sleeping and calm when awakened.  States pain significantly improved ?Respiratory: clear to auscultation bilaterally, no wheezing, no crackles. Normal respiratory effort. No accessory muscle use.  ?Cardiovascular: Pulse regular, S1-S2, no peripheral edema.  Normotensive ?Abdomen: no tenderness, no masses palpated. Bowel sounds positive.  LBM 4/12 ?Musculoskeletal: Patient reporting severe back pain but was unable to participate with exam enough for me to palpate her paraspinous muscles or over her spinal column ?Neurologic: CN 2-12 grossly intact. Sensation intact, DTR normal. Strength 5/5 x all 4 extremities.  ?Psychiatric: Normal judgment and insight. Alert and oriented x 3. Normal mood.  ? ? ?Assessment/Plan: ?MRSA tricuspid valve endocarditis/Severe TR/Severe sepsis, poa ?-Sepsis physiology resolved ?-Continue IV vancomycin through 5/13 ?-Repeat blood cultures 4/2 considered negative, staph epi likely contaminant ?-ID following; WBC 12,900 with normal neutrophils slightly elevated absolute neutrophils but trend is downward ?-Angio vac attempted on 4/7 but was aborted due to PFO ?-We will likely need follow-up echocardiogram prior to discharge at least 5 to 7 days before antibiotics are completed on 5/13-patient appears to be a poor candidate for follow-up so would like to complete this before discharge ? ?History of lumbar osteomyelitis/persistent lumbar back pain/edition ? -Patient initially presented with severe lumbar back pain which has been present for 2 years since her initial episode of osteomyelitis.  When asked if this is one of the reason she has continue to use drugs especially narcotics she stated yes ?-MRI completed at time of this admission demonstrated no findings suspicious for discitis or osteomyelitis ?-She does continue  to have intermittent low-grade leukocytosis without fever. ?-At this time do not suspect new infection in lumbar spine but will continue to  follow ?-We will clarify pain medications as Oxy IR to be used first for moderate pain and IV Dilaudid to be used for breakthrough pain ?-Add Lyrica 25 mg 3 times daily, Robaxin 750 mg 4 times daily, change Advil to scheduled every 6 hours and continue protonix ?-Hopefully when pain improves she will be able to participate with therapy more effectively. ?-Not consistently participating with therapy but pain now improved with adjustment in pain medication regimen.  Plan to have Ortho tech fit patient for corset brace to help support lumbar spine ? ? ?A physical therapy consult is indicated based on the patient?s mobility assessment. ? ? ?Mobility Assessment (last 72 hours)   ? ? Mobility Assessment   ? ? Row Name 08/17/21 1948 08/16/21 1600 08/16/21 1543 08/15/21 1621 08/15/21 1100  ? Does patient have an order for bedrest or is patient medically unstable No - Continue assessment -- No - Continue assessment No - Continue assessment --  ? What is the highest level of mobility based on the progressive mobility assessment? Level 4 (Walks with assist in room) - Balance while marching in place and cannot step forward and back - Complete Level 3 (Stands with assist) - Balance while standing  and cannot march in place Level 3 (Stands with assist) - Balance while standing  and cannot march in place Level 3 (Stands with assist) - Balance while standing  and cannot march in place Level 3 (Stands with assist) - Balance while standing  and cannot march in place  ? Is the above level different from baseline mobility prior to current illness? Yes - Recommend PT order -- Yes - Recommend PT order Yes - Recommend PT order --  ? ?  ?  ? ?  ? ?Cough, septic pulmonary emboli ?-Antibiotics as above, add guaifenesin, Tessalon Perles ?Stable on room air ?-Chest x-ray 4/11 with multiple nodular densities are noted through out both lungs, including cavitary nodule in right midlung, consistent with septic emboli. ? ?Hyponatremia ?-Essentially  resolved with sodium averaging between 133 and 137 ?  ?Iron deficiency anemia ?Iron panel with serum iron of 22, transferrin saturation 7, ferritin 51 and TIBC 312. ?-Given IV iron and now on oral iron replacement ?  ?IV drug abuse  ?-Counseled by previous attending physician ?  ?Opioid use disorder ?Continue pain control with oxycodone 5 mg p.o. q4h PRN moderate pain and Dilaudid 1 mg IV q2h PRN severe pain ?See above regarding the addition of Lyrica, Robaxin ?Continue Cymbalta ? ? ? ?Data Reviewed: ?Basic Metabolic Panel: ?Recent Labs  ?Lab 08/11/21 ?1251 08/12/21 ?1100 08/14/21 ?0412 08/15/21 ?[id number] 08/16/21 ?0230  ?NA 139 137 133* 136 134*  ?K 4.2 4.2 4.4 4.2 4.0  ?CL 105 107 104 106 101  ?CO2  --  23 23 24 27   ?GLUCOSE 100* 130* 106* 98 108*  ?BUN 9 10 12 11 10   ?CREATININE 0.40* 0.66 0.56 0.53 0.58  ?CALCIUM  --  9.0 8.4* 8.6* 8.6*  ? ?Liver Function Tests: ?No results for input(s): AST, ALT, ALKPHOS, BILITOT, PROT, ALBUMIN in the last 168 hours. ?No results for input(s): LIPASE, AMYLASE in the last 168 hours. ?No results for input(s): AMMONIA in the last 168 hours. ?CBC: ?Recent Labs  ?Lab 08/11/21 ?1251 08/12/21 ?1100 08/14/21 ?0412 08/15/21 ?[id number] 08/16/21 ?0230  ?WBC  --  15.3* 14.0* 10.9* 12.9*  ?NEUTROABS  --   --  10.2*  --  8.6*  ?HGB 8.5* 9.2* 7.9* 8.5* 8.7*  ?HCT 25.0* 31.3* 25.9* 27.8* 28.0*  ?MCV  --  76.9* 76.4* 76.8* 77.1*  ?PLT  --  407* 329 265 263  ? ?Cardiac Enzymes: ?No results for input(s): CKTOTAL, CKMB, CKMBINDEX, TROPONINI in the last 168 hours. ?BNP (last 3 results) ?No results for input(s): BNP in the last 8760 hours. ? ?ProBNP (last 3 results) ?No results for input(s): PROBNP in the last 8760 hours. ? ?CBG: ?No results for input(s): GLUCAP in the last 168 hours. ? ?No results found for this or any previous visit (from the past 240 hour(s)).  ? ?Studies: ?No results found. ? ?Scheduled Meds: ? benzonatate  100 mg Oral TID  ? Chlorhexidine Gluconate Cloth  6 each Topical Daily  ?  DULoxetine  40 mg Oral Daily  ? feeding supplement  1 Container Oral BID BM  ? feeding supplement  237 mL Oral Q24H  ? ferrous gluconate  324 mg Oral Q breakfast  ? ibuprofen  600 mg Oral Q6H  ? magnesium oxide  400 mg Oral BID  ? me

## 2021-08-19 LAB — BASIC METABOLIC PANEL
Anion gap: 6 (ref 5–15)
BUN: 18 mg/dL (ref 6–20)
CO2: 28 mmol/L (ref 22–32)
Calcium: 8.6 mg/dL — ABNORMAL LOW (ref 8.9–10.3)
Chloride: 104 mmol/L (ref 98–111)
Creatinine, Ser: 0.69 mg/dL (ref 0.44–1.00)
GFR, Estimated: >60 mL/min (ref >60)
Glucose, Bld: 90 mg/dL (ref 70–99)
Potassium: 4.5 mmol/L (ref 3.5–5.1)
Sodium: 138 mmol/L (ref 135–145)

## 2021-08-19 MED ORDER — BENZONATATE 100 MG PO CAPS: 100.0000 mg | ORAL_CAPSULE | Freq: Three times a day (TID) | ORAL | Status: DC | PRN

## 2021-08-19 NOTE — Progress Notes (Signed)
? Latoya [name]  [id number] DOB: Aug 10, 1985 DOA: 08/02/2021 ?PCP: Pcp, No   ? ?Brief Narrative:  ?36 year old with a history of IV heroin abuse and MSSA tricuspid valve endocarditis 2020 as well as MRSA discitis and osteomyelitis 2021 who presented to the [facility] ED 08/02/2021 with low back pain cough and fevers.  MRI of the lumbar spine was negative for infection but further work-up revealed MRSA bacteremia with tricuspid valve vegetation and septic pulmonary emboli. ? ?Ultimately the patient was transferred to [facility] for TCTS eval.  She was taken to the OR for/7/23 for a planned angio VAC procedure, but the discovery of a significant PFO required the abortion of this procedure. ? ?Consultants:  ?TCTS ?ID ? ?Code Status: FULL CODE ? ?DVT prophylaxis: ?Xarelto ? ?Interim Hx: ?Afebrile.  Vital signs stable.  No acute events reported overnight. ? ?Assessment & Plan: ? ?MRSA tricuspid valve endocarditis with severe TR and septic pulmonary emboli - severe sepsis ?Sepsis has resolved -continue IV vancomycin through 09/16/2021 per ID recommendations -angio VAC attempted 4/7 but had to be aborted due to PFO -plan for repeat TTE 5-7 days prior to discontinuation of antibiotics ? ?Persistent chronic lumbar pain -history of lumbar osteomyelitis ?Present for 2 years and related to prior episode of osteomyelitis -MRI this admission without evidence of discitis or osteomyelitis ? ?IV drug abuse ?Makes it unsafe to pursue outpatient IV antibiotic therefore necessitating prolonged inpatient stay ? ?Family Communication:  ?Disposition:  ? ?Objective: ?Blood pressure 125/77, pulse 94, temperature 98.2 ?F (36.8 ?C), temperature source Oral, resp. rate 13, height 5\' 6"  (1.676 m), weight 87.5 kg, SpO2 98 %. ? ?Intake/Output Summary (Last 24 hours) at 08/19/2021 1012 ?Last data filed at 08/19/2021 0104 ?Gross per 24 hour  ?Intake 559.27 ml  ?Output 1600 ml  ?Net -1040.73 ml  ? ?Filed Weights  ? 08/03/21 1036 08/08/21 1111  08/09/21 1607  ?Weight: 82.2 kg 82.2 kg 87.5 kg  ? ? ?Examination: ?Exam not indicated today.  ? ?CBC: ?Recent Labs  ?Lab 08/14/21 ?0412 08/15/21 ?[id number] 08/16/21 ?0230  ?WBC 14.0* 10.9* 12.9*  ?NEUTROABS 10.2*  --  8.6*  ?HGB 7.9* 8.5* 8.7*  ?HCT 25.9* 27.8* 28.0*  ?MCV 76.4* 76.8* 77.1*  ?PLT 329 265 263  ? ?Basic Metabolic Panel: ?Recent Labs  ?Lab 08/15/21 ?[id number] 08/16/21 ?0230 08/19/21 ?0530  ?NA 136 134* 138  ?K 4.2 4.0 4.5  ?CL 106 101 104  ?CO2 24 27 28   ?GLUCOSE 98 108* 90  ?BUN 11 10 18   ?CREATININE 0.53 0.58 0.69  ?CALCIUM 8.6* 8.6* 8.6*  ? ?GFR: ?Estimated Creatinine Clearance: 110.4 mL/min (by C-G formula based on SCr of 0.69 mg/dL). ? ? ?Scheduled Meds: ? benzonatate  100 mg Oral TID  ? Chlorhexidine Gluconate Cloth  6 each Topical Daily  ? DULoxetine  40 mg Oral Daily  ? feeding supplement  1 Container Oral BID BM  ? feeding supplement  237 mL Oral Q24H  ? ferrous gluconate  324 mg Oral Q breakfast  ? ibuprofen  600 mg Oral Q6H  ? magnesium oxide  400 mg Oral BID  ? methocarbamol  750 mg Oral QID  ? multivitamin with minerals  1 tablet Oral Daily  ? nicotine  21 mg Transdermal Daily  ? pantoprazole  40 mg Oral Daily  ? pregabalin  25 mg Oral TID  ? rivaroxaban  10 mg Oral Q supper  ? thiamine  100 mg Oral Daily  ? ?Continuous Infusions: ? lactated ringers Stopped (08/11/21  1336)  ? vancomycin 1,000 mg (08/19/21 0554)  ? ? ? LOS: 17 days  ? ?Cherene Altes, MD ?[facility] ?Office  [phone] ?Pager - Text Page per Shea Evans ? ?If 7PM-7AM, please contact night-coverage per Amion ?08/19/2021, 10:12 AM ? ? ? ? ?

## 2021-08-20 DIAGNOSIS — I079 Rheumatic tricuspid valve disease, unspecified: Secondary | ICD-10-CM | POA: Diagnosis not present

## 2021-08-20 LAB — VANCOMYCIN, PEAK: Vancomycin Pk: 30 ug/mL (ref 30–40)

## 2021-08-20 NOTE — Progress Notes (Signed)
? Latoya [name]  UKG:[id number] DOB: 07-Apr-1986 DOA: 08/02/2021 ?PCP: Pcp, No   ? ?Brief Narrative:  ?36 year old with a history of IV heroin abuse and MSSA tricuspid valve endocarditis 2020 as well as MRSA discitis and osteomyelitis 2021 who presented to the [facility] ED 08/02/2021 with low back pain cough and fevers.  MRI of the lumbar spine was negative for infection but further work-up revealed MRSA bacteremia with tricuspid valve vegetation and septic pulmonary emboli. ? ?Ultimately the patient was transferred to [facility] for TCTS eval.  She was taken to the OR for/7/23 for a planned angio VAC procedure, but the discovery of a significant PFO required the abortion of this procedure. ? ?Consultants:  ?TCTS ?ID ? ?Code Status: FULL CODE ? ?DVT prophylaxis: ?Xarelto ? ?Interim Hx: ?Afebrile.  Vital signs stable.  Saturation 95% room air.  No acute events reported overnight.  No new complaints at time of visit today.  Reports ongoing low back pain. ? ?Assessment & Plan: ? ?MRSA tricuspid valve endocarditis with severe TR and septic pulmonary emboli - severe sepsis ?Sepsis has resolved -continue IV vancomycin through 09/16/2021 per ID recommendations -angio VAC attempted 4/7 but had to be aborted due to PFO -plan for repeat TTE 5-7 days prior to discontinuation of antibiotics ? ?Persistent chronic lumbar pain -history of lumbar osteomyelitis ?Present for 2 years and related to prior episode of osteomyelitis -MRI this admission without evidence of discitis or osteomyelitis ? ?IV drug abuse ?Makes it unsafe to pursue outpatient IV antibiotic therefore necessitating prolonged inpatient stay ? ?Family Communication: No family present at time of exam ?Disposition: As prior stated -prolonged hospital stay due to need for inpatient IV antibiotics ? ?Objective: ?Blood pressure 132/81, pulse 99, temperature 98.2 ?F (36.8 ?C), temperature source Oral, resp. rate (!) 22, height 5\' 6"  (1.676 m), weight 87.5 kg, SpO2 95  %. ? ?Intake/Output Summary (Last 24 hours) at 08/20/2021 1055 ?Last data filed at 08/20/2021 0900 ?Gross per 24 hour  ?Intake 400 ml  ?Output 1900 ml  ?Net -1500 ml  ? ? ?Filed Weights  ? 08/03/21 1036 08/08/21 1111 08/09/21 1607  ?Weight: 82.2 kg 82.2 kg 87.5 kg  ? ? ?Examination: ?General: No acute respiratory distress ?Lungs: Clear to auscultation bilaterally without wheezes or crackles ?Cardiovascular: Regular rate and rhythm  ?Abdomen: Nontender, nondistended, soft, bowel sounds positive, no rebound, no ascites, no appreciable mass ?Extremities: No significant cyanosis, clubbing, or edema bilateral lower extremities ? ? ?CBC: ?Recent Labs  ?Lab 08/14/21 ?0412 08/15/21 ?10/15/21 08/16/21 ?0230  ?WBC 14.0* 10.9* 12.9*  ?NEUTROABS 10.2*  --  8.6*  ?HGB 7.9* 8.5* 8.7*  ?HCT 25.9* 27.8* 28.0*  ?MCV 76.4* 76.8* 77.1*  ?PLT 329 265 263  ? ? ?Basic Metabolic Panel: ?Recent Labs  ?Lab 08/15/21 ?10/15/21 08/16/21 ?0230 08/19/21 ?0530  ?NA 136 134* 138  ?K 4.2 4.0 4.5  ?CL 106 101 104  ?CO2 24 27 28   ?GLUCOSE 98 108* 90  ?BUN 11 10 18   ?CREATININE 0.53 0.58 0.69  ?CALCIUM 8.6* 8.6* 8.6*  ? ? ?GFR: ?Estimated Creatinine Clearance: 110.4 mL/min (by C-G formula based on SCr of 0.69 mg/dL). ? ? ?Scheduled Meds: ? Chlorhexidine Gluconate Cloth  6 each Topical Daily  ? DULoxetine  40 mg Oral Daily  ? feeding supplement  1 Container Oral BID BM  ? feeding supplement  237 mL Oral Q24H  ? ferrous gluconate  324 mg Oral Q breakfast  ? ibuprofen  600 mg Oral Q6H  ? magnesium  oxide  400 mg Oral BID  ? methocarbamol  750 mg Oral QID  ? multivitamin with minerals  1 tablet Oral Daily  ? nicotine  21 mg Transdermal Daily  ? pantoprazole  40 mg Oral Daily  ? pregabalin  25 mg Oral TID  ? rivaroxaban  10 mg Oral Q supper  ? thiamine  100 mg Oral Daily  ? ?Continuous Infusions: ? lactated ringers Stopped (08/11/21 1336)  ? vancomycin 1,000 mg (08/20/21 0519)  ? ? ? LOS: 18 days  ? ?Lonia Blood, MD ?[facility] ?Office   [phone] ?Pager - Text Page per Loretha Stapler ? ?If 7PM-7AM, please contact night-coverage per Amion ?08/20/2021, 10:55 AM ? ? ? ? ?

## 2021-08-20 NOTE — Progress Notes (Signed)
Physical Therapy Treatment ?Patient Details ?Name: Latoya [name] ?MRN: [id number] ?DOB: 1986-03-29 ?Today's Date: 08/20/2021 ? ? ?History of Present Illness Latoya [name] is a 36 y.o. female admitted 08/02/21 with back pain, hypotension, hyponatremia, leukocytosis and neutrophilia. Found to have bacteremia possible sepitc pulmonary emboli from endocarditis. Marland Kitchen PMH: IVDU, heroin abuse, ongoing. ? ?  ?PT Comments  ? ? Pt was agreeable to OOB mobility and trial use of spinal brace to manage her back pain. Pt was able to progress to ambulating up to ~28 ft in the room with a RW and min guard-minA with the spinal brace donned. Pt reporting she believed the brace helped manage the pain. Educated pt and had pt perform several seated exercises to increase her lower extremity strength. Updated PT goals. Will continue to follow acutely. Recommend AIR, but if AIR is not possible then recommend SNF. ? ?   ?Recommendations for follow up therapy are one component of a multi-disciplinary discharge planning process, led by the attending physician.  Recommendations may be updated based on patient status, additional functional criteria and insurance authorization. ? ?Follow Up Recommendations ? Acute inpatient rehab (3hours/day) (if not AIR then SNF) ?  ?  ?Assistance Recommended at Discharge Frequent or constant Supervision/Assistance  ?Patient can return home with the following A little help with walking and/or transfers;A little help with bathing/dressing/bathroom;Assistance with cooking/housework;Direct supervision/assist for medications management;Assist for transportation;Help with stairs or ramp for entrance ?  ?Equipment Recommendations ? Rolling walker (2 wheels);BSC/3in1  ?  ?Recommendations for Other Services   ? ? ?  ?Precautions / Restrictions Precautions ?Precautions: Fall;Back (for pain management) ?Precaution Booklet Issued: No ?Precaution Comments: Contact precs, monitor HR, pain management, reviewed spinal precautions  for pain management ?Required Braces or Orthoses: Spinal Brace (for pain management) ?Restrictions ?Weight Bearing Restrictions: No  ?  ? ?Mobility ? Bed Mobility ?Overal bed mobility: Needs Assistance ?Bed Mobility: Supine to Sit ?  ?  ?Supine to sit: Supervision, HOB elevated ?  ?  ?General bed mobility comments: Extra time, pt transitioning supine > sit L EOB with HOb elevated. Pt declining log roll technique for back pain management as she reports this hurts her back ?  ? ?Transfers ?Overall transfer level: Needs assistance ?Equipment used: Rolling walker (2 wheels) ?Transfers: Sit to/from Stand, Bed to chair/wheelchair/BSC ?Sit to Stand: Min assist ?  ?Step pivot transfers: Min assist ?  ?  ?  ?General transfer comment: Cues provided to scoot anteriorly and to push up with 1 UE from seated surface to RW, minA to power up to stand 1x from EOB and 1x from commode. ?  ? ?Ambulation/Gait ?Ambulation/Gait assistance: Min assist, Min guard ?Gait Distance (Feet): 28 Feet (x2 bouts of ~3 ft > ~28 ft) ?Assistive device: Rolling walker (2 wheels) ?Gait Pattern/deviations: Step-to pattern, Decreased step length - left, Decreased stance time - right, Decreased dorsiflexion - left, Antalgic, Trunk flexed ?Gait velocity: reduced ?Gait velocity interpretation: <1.31 ft/sec, indicative of household ambulator ?  ?General Gait Details: Pt with slow, antalgic gait pattern, displaying increased difficulty weight bearing on R and advancing step on L. Poor L foot clearance. TCs provided to advance L leg. Min guard-minA for stability, no LOB. ? ? ?Stairs ?  ?  ?  ?  ?  ? ? ?Wheelchair Mobility ?  ? ?Modified Rankin (Stroke Patients Only) ?  ? ? ?  ?Balance Overall balance assessment: Needs assistance ?Sitting-balance support: Feet supported ?Sitting balance-Leahy Scale: Good ?Sitting balance - Comments: No LOB sitting EOB  without UE support ?  ?Standing balance support: Bilateral upper extremity supported, Single extremity supported,  During functional activity ?Standing balance-Leahy Scale: Poor ?Standing balance comment: Reliant on UE support ?  ?  ?  ?  ?  ?  ?  ?  ?  ?  ?  ?  ? ?  ?Cognition Arousal/Alertness: Awake/alert ?Behavior During Therapy: [facility] for tasks assessed/performed, Flat affect ?Overall Cognitive Status: No family/caregiver present to determine baseline cognitive functioning ?Area of Impairment: Problem solving ?  ?  ?  ?  ?  ?  ?  ?  ?  ?  ?  ?  ?  ?  ?Problem Solving: Slow processing, Difficulty sequencing, Requires verbal cues ?General Comments: Pt needing encouragement throughout session. Pt soft spoken and with increased processing time. Needs repeated cues to sequence set-up for transfers and to problem solve how/when to turn walker. ?  ?  ? ?  ?Exercises General Exercises - Lower Extremity ?Long Arc Quad: AROM, Strengthening, Both, 10 reps, Seated ?Hip ABduction/ADduction: AROM, Strengthening, Both, 10 reps, Seated ?Hip Flexion/Marching: AROM, Strengthening, Both, 10 reps, Seated ? ?  ?General Comments General comments (skin integrity, edema, etc.): HR up to 110s; educated pt on donning/doffing back brace ?  ?  ? ?Pertinent Vitals/Pain Pain Assessment ?Pain Assessment: Faces ?Faces Pain Scale: Hurts little more ?Pain Location: lower back ?Pain Descriptors / Indicators: Grimacing, Guarding ?Pain Intervention(s): Monitored during session, Limited activity within patient's tolerance, Repositioned, Patient requesting pain meds-RN notified  ? ? ?Home Living   ?  ?  ?  ?  ?  ?  ?  ?  ?  ?   ?  ?Prior Function    ?  ?  ?   ? ?PT Goals (current goals can now be found in the care plan section) Acute Rehab PT Goals ?Patient Stated Goal: to walk again ?PT Goal Formulation: With patient ?Time For Goal Achievement: 09/01/21 ?Potential to Achieve Goals: Fair ?Progress towards PT goals: Progressing toward goals ? ?  ?Frequency ? ? ? Min 3X/week ? ? ? ?  ?PT Plan Equipment recommendations need to be updated  ? ? ?Co-evaluation   ?  ?  ?   ?  ? ?  ?AM-PAC PT "6 Clicks" Mobility   ?Outcome Measure ? Help needed turning from your back to your side while in a flat bed without using bedrails?: A Little ?Help needed moving from lying on your back to sitting on the side of a flat bed without using bedrails?: A Little ?Help needed moving to and from a bed to a chair (including a wheelchair)?: A Little ?Help needed standing up from a chair using your arms (e.g., wheelchair or bedside chair)?: A Little ?Help needed to walk in hospital room?: A Little ?Help needed climbing 3-5 steps with a railing? : Total ?6 Click Score: 16 ? ?  ?End of Session Equipment Utilized During Treatment: Back brace ?Activity Tolerance: Patient limited by pain;Patient tolerated treatment well ?Patient left: in bed;with call bell/phone within reach ?Nurse Communication: Mobility status;Other (comment);Patient requests pain meds (back brace on) ?PT Visit Diagnosis: Other abnormalities of gait and mobility (R26.89);Muscle weakness (generalized) (M62.81);Difficulty in walking, not elsewhere classified (R26.2);Unsteadiness on feet (R26.81);Pain ?Pain - part of body:  (back) ?  ? ? ?Time: 8588-5027 ?PT Time Calculation (min) (ACUTE ONLY): 28 min ? ?Charges:  $Gait Training: 8-22 mins ?$Therapeutic Exercise: 8-22 mins          ?          ? ?  Raymond GurneyAnessa Pettis, PT, DPT ?Acute Rehabilitation Services  ?Pager: [phone] ?Office: [phone] ? ? ? ?Henrene DodgeAnessa M Pettis ?08/20/2021, 5:22 PM ? ?

## 2021-08-21 DIAGNOSIS — I079 Rheumatic tricuspid valve disease, unspecified: Secondary | ICD-10-CM | POA: Diagnosis not present

## 2021-08-21 LAB — VANCOMYCIN, TROUGH: Vancomycin Tr: 14 ug/mL — ABNORMAL LOW (ref 15–20)

## 2021-08-21 MED ORDER — VANCOMYCIN HCL 750 MG/150ML IV SOLN
750.0000 mg | Freq: Two times a day (BID) | INTRAVENOUS | Status: DC
  Filled 2021-08-21: qty 150

## 2021-08-21 MED ORDER — VANCOMYCIN HCL IN DEXTROSE 1-5 GM/200ML-% IV SOLN
1000.0000 mg | Freq: Two times a day (BID) | INTRAVENOUS | Status: DC
  Administered 2021-08-21 – 2021-08-29 (×16): 1000 mg via INTRAVENOUS
  Filled 2021-08-21 (×21): qty 200

## 2021-08-21 MED ORDER — BACLOFEN 5 MG HALF TABLET
5.0000 mg | ORAL_TABLET | Freq: Three times a day (TID) | ORAL | Status: DC
  Administered 2021-08-21 – 2021-08-22 (×3): 5 mg via ORAL
  Filled 2021-08-21 (×3): qty 1

## 2021-08-21 MED ORDER — HYDROMORPHONE HCL 1 MG/ML IJ SOLN
1.0000 mg | INTRAMUSCULAR | Status: DC | PRN
  Administered 2021-08-21 – 2021-08-29 (×41): 1 mg via INTRAVENOUS
  Filled 2021-08-21 (×42): qty 1

## 2021-08-21 MED ORDER — PREGABALIN 75 MG PO CAPS
75.0000 mg | ORAL_CAPSULE | Freq: Three times a day (TID) | ORAL | Status: DC
  Administered 2021-08-21 – 2021-09-17 (×81): 75 mg via ORAL
  Filled 2021-08-21 (×81): qty 1

## 2021-08-21 NOTE — Progress Notes (Addendum)
[facility] ?PROGRESS NOTE ? ?Latoya [name] [id number] DOB: 09-Jul-1985 DOA: 08/02/2021 ?PCP: Pcp, No ? ?Status  ?Remains inpatient appropriate because:  ?Unsafe discharge plan.  Patient is inappropriate given history of IVDA to discharge home with PICC line for prolonged antibiotic therapy ? ?Barriers to discharge: ?Social: ?Known history of IVDA ?Clinical: ?Requirement for prolonged IV antibiotic therapy for current endocarditis ? ?Level of care:  ?Med-Surg ? ? ?Code Status: Full ?Family Communication: Patient ?DVT prophylaxis: Xarelto ?COVID vaccination status: Vaccination status unknown ? ? ?HPI: ?35/F with history of IV heroin abuse, history of MSSA tricuspid valve endocarditis in 20/20 treated with Ancef and [facility], history of MRSA discitis/osteomyelitis in 2021 treated with antibiotics presented to the ED with low back pain, cough, fevers.  MRI LS spine negative for infection ?-Found to have MRSA bacteremia with tricuspid valve vegetation, septic pulmonary emboli ?-TEE 4/4 noted large highly mobile 1.8 cm tricuspid valve vegetation with severe TR ?-[facility] ?-4/7 taken to the OR for angio [facility] but procedure had to be aborted due to PFO ? ?Subjective: ?Reports corset brace helps with mobility but now that she is beginning to flex more her back is hurting more.  States even laying in bed she is unable to flex at the hips and lie on her side in either direction. ? ?Objective: ?Vitals:  ? 08/20/21 2350 08/21/21 0330  ?BP: 133/85 132/83  ?Pulse: (!) 101 96  ?Resp: 20 19  ?Temp: 98.2 ?F (36.8 ?C) 98.2 ?F (36.8 ?C)  ?SpO2: 96% 98%  ? ? ?Intake/Output Summary (Last 24 hours) at 08/21/2021 0757 ?Last data filed at 08/21/2021 0025 ?Gross per 24 hour  ?Intake 237 ml  ?Output 2400 ml  ?Net -2163 ml  ? ?Filed Weights  ? 08/03/21 1036 08/08/21 1111 08/09/21 1607  ?Weight: 82.2 kg 82.2 kg 87.5 kg   ? ? ?Exam: ? ?Constitutional: NAD, appears slightly uncomfortable and is reporting increased back pain ?Respiratory: Room air, bilateral lung sounds clear to auscultation anteriorly.  Normal respiratory effort. No accessory muscle use.  ?Cardiovascular: Pulse regular, S1-S2, no peripheral edema.  Normotensive ?Abdomen: no tenderness, no masses palpated. Bowel sounds positive.  LBM 4/14 ?Musculoskeletal: Patient reporting severe back pain but was unable to participate with exam enough for me to palpate her paraspinous muscles or over her spinal column ?Neurologic: CN 2-12 grossly intact. Sensation intact, DTR normal. Strength 5/5 x all 4 extremities.  ?Psychiatric: Normal judgment and insight. Alert and oriented x 3. Normal mood.  ? ? ?Assessment/Plan: ?MRSA tricuspid valve endocarditis/Severe TR/Severe sepsis, poa ?-Sepsis physiology resolved ?-Continue IV vancomycin through 5/13 ?-Repeat blood cultures 4/2 considered negative, staph epi likely contaminant ?-ID following; WBC 12,900 with normal neutrophils slightly elevated absolute neutrophils but trend is downward ?-Angio vac attempted on 4/7 but was aborted due to PFO ?-We will likely need follow-up echocardiogram prior to discharge at least 5 to 7 days before antibiotics are completed on 5/13-patient appears to be a poor candidate for follow-up so would like to complete this before discharge ? ?History of lumbar osteomyelitis/persistent lumbar back pain/edition ? -Patient initially presented with severe lumbar back pain which has been present for 2 years since her initial episode of osteomyelitis.  When asked if this is one of the reason she has continue to use drugs especially narcotics she stated yes ?-MRI completed at time of this admission demonstrated no findings suspicious for discitis or osteomyelitis ?-She does  continue to have intermittent low-grade leukocytosis without fever. ?-At this time do not suspect new infection in lumbar spine but will  continue to follow ?-We will clarify pain medications as Oxy IR to be used first for moderate pain and IV Dilaudid to be used for breakthrough pain ?-4/17 continue Lyrica but increase from 25 mg to 75 mg TID, continue Robaxin 750 mg 4 times daily, and scheduled Advil every 6 hours and continue protonix ?-Able to participate more consistently with corset brace but now that she is up moving around and using her back muscles she is having increased pain so Lyrica adjusted as above.  We will try to hold off on increasing narcotics ? ?**Nursing staff informed me that she is requesting her IV Dilaudid on schedule every 2 hours.  We will decrease frequency of this to every 4 hours since she cannot use after discharge.  Nursing staff says she intermittently is able to lay on her side but not consistently.  Due to origins of back pain we will discontinue Robaxin in favor of baclofen 5 mg 3 times daily with first dose now ? ? ?A physical therapy consult is indicated based on the patient?s mobility assessment. ? ? ?Mobility Assessment (last 72 hours)   ? ? Mobility Assessment   ? ? Row Name 08/20/21 1940 08/20/21 1600 08/20/21 0800 08/19/21 2045 08/19/21 0800  ? Does patient have an order for bedrest or is patient medically unstable No - Continue assessment -- No - Continue assessment No - Continue assessment No - Continue assessment  ? What is the highest level of mobility based on the progressive mobility assessment? Level 5 (Walks with assist in room/hall) - Balance while stepping forward/back and can walk in room with assist - Complete Level 5 (Walks with assist in room/hall) - Balance while stepping forward/back and can walk in room with assist - Complete Level 5 (Walks with assist in room/hall) - Balance while stepping forward/back and can walk in room with assist - Complete Level 5 (Walks with assist in room/hall) - Balance while stepping forward/back and can walk in room with assist - Complete Level 5 (Walks with  assist in room/hall) - Balance while stepping forward/back and can walk in room with assist - Complete  ? Is the above level different from baseline mobility prior to current illness? Yes - Recommend PT order -- Yes - Recommend PT order Yes - Recommend PT order Yes - Recommend PT order  ? ? Row Name 08/18/21 0830  ?  ?  ?  ?  ? Does patient have an order for bedrest or is patient medically unstable No - Continue assessment      ? What is the highest level of mobility based on the progressive mobility assessment? Level 5 (Walks with assist in room/hall) - Balance while stepping forward/back and can walk in room with assist - Complete      ? Is the above level different from baseline mobility prior to current illness? Yes - Recommend PT order      ? ?  ?  ? ?  ? ?Cough, septic pulmonary emboli ?-Antibiotics as above, add guaifenesin, Tessalon Perles ?Stable on room air ?-Chest x-ray 4/11 with multiple nodular densities are noted through out both lungs, including cavitary nodule in right midlung, consistent with septic emboli. ? ?Hyponatremia ?-Essentially resolved with sodium averaging between 133 and 137 ?  ?Iron deficiency anemia ?Iron panel with serum iron of 22, transferrin saturation 7, ferritin 51 and TIBC  312. ?-Given IV iron and now on oral iron replacement ?  ?IV drug abuse  ?-Counseled by previous attending physician ?  ?Opioid use disorder ?Continue pain control with oxycodone 5 mg p.o. q4h PRN moderate pain and Dilaudid 1 mg IV q2h PRN severe pain ?See above regarding the addition of Lyrica, Robaxin ?Continue Cymbalta ? ? ? ?Data Reviewed: ?Basic Metabolic Panel: ?Recent Labs  ?Lab 08/15/21 ?3295 08/16/21 ?0230 08/19/21 ?0530  ?NA 136 134* 138  ?K 4.2 4.0 4.5  ?CL 106 101 104  ?CO2 24 27 28   ?GLUCOSE 98 108* 90  ?BUN 11 10 18   ?CREATININE 0.53 0.58 0.69  ?CALCIUM 8.6* 8.6* 8.6*  ? ?Liver Function Tests: ?No results for input(s): AST, ALT, ALKPHOS, BILITOT, PROT, ALBUMIN in the last 168 hours. ?No results  for input(s): LIPASE, AMYLASE in the last 168 hours. ?No results for input(s): AMMONIA in the last 168 hours. ?CBC: ?Recent Labs  ?Lab 08/15/21 ? 08/16/21 ?0230  ?WBC 10.9* 12.9*  ?NEUTROABS  --  8.6*  ?HGB 8.5* 8.7*  ?HCT 27.8* 28.0*

## 2021-08-21 NOTE — Progress Notes (Signed)
Mobility Specialist Progress Note ? ? 08/21/21 1840  ?Mobility  ?Activity Ambulated with assistance in room  ?Level of Assistance Standby assist, set-up cues, supervision of patient - no hands on  ?Distance Ambulated (ft) 30 ft  ?Activity Response Tolerated well  ?$Mobility charge 1 Mobility  ? ?Received pt in bed having no complaints and agreeable. Donned back brace with min A + requiring inc in time d/t pain in lower back when standing. While ambulating, pt stated that the push off in there gait catalyzes there back pain. X1 standing break d/t fatigue secondary to pain. Placed in chair and notified RN about pain.  ? ?Latoya [name] ?Mobility Specialist ?Phone Number [phone] ? ?

## 2021-08-21 NOTE — Progress Notes (Signed)
Physical Therapy Treatment ?Patient Details ?Name: Latoya [name] ?MRN: [id number] ?DOB: 11/22/1985 ?Today's Date: 08/21/2021 ? ? ?History of Present Illness Latoya [name] is a 36 y.o. female admitted 08/02/21 with back pain, hypotension, hyponatremia, leukocytosis and neutrophilia. Found to have bacteremia possible sepitc pulmonary emboli from endocarditis. PMH: IVDU, heroin abuse, ongoing. ? ?  ?PT Comments  ? ? Pt received in supine, agreeable to therapy session with goal of getting up to [facility] and afterward pt agreeable to gait training in room short distance. Distance limited to 64ft due to low back pain, pt HR 110 bpm with exertion, an improvement from previous week and less diaphoretic with mobility this date. Pt remains heavily reliant on RW support and needs up to minA for sit<>stand transfers and stability during gait trial. Encouraged pt to sit up in recliner but per pt her pain was too high, RN notified. Pt continues to benefit from PT services to progress toward functional mobility goals.    ?Recommendations for follow up therapy are one component of a multi-disciplinary discharge planning process, led by the attending physician.  Recommendations may be updated based on patient status, additional functional criteria and insurance authorization. ? ?Follow Up Recommendations ? Acute inpatient rehab (3hours/day) (if not AIR then SNF) ?  ?  ?Assistance Recommended at Discharge Frequent or constant Supervision/Assistance  ?Patient can return home with the following A little help with walking and/or transfers;A little help with bathing/dressing/bathroom;Assistance with cooking/housework;Direct supervision/assist for medications management;Assist for transportation;Help with stairs or ramp for entrance ?  ?Equipment Recommendations ? Rolling walker (2 wheels);BSC/3in1  ?  ?Recommendations for Other Services   ? ? ?  ?Precautions / Restrictions Precautions ?Precautions: Fall;Back (for pain management) ?Precaution  Booklet Issued: No ?Precaution Comments: Contact precs, monitor HR, pain management, reviewed spinal precautions for pain management ?Required Braces or Orthoses: Spinal Brace (for pain management) ?Spinal Brace: Lumbar corset ?Restrictions ?Weight Bearing Restrictions: No  ?  ? ?Mobility ? Bed Mobility ?Overal bed mobility: Needs Assistance ?Bed Mobility: Supine to Sit, Sit to Supine ?  ?  ?Supine to sit: Supervision, HOB elevated ?Sit to supine: Min assist, HOB elevated ?  ?General bed mobility comments: BLE assist, tried having pt use strap to assist with RLE lifting but pt unable to use, needing BUE braced behind her hips while PTA lifting her legs due to pain; per pt she sleeps in recliner and log rolling is too painful to attempt. ?  ? ?Transfers ?Overall transfer level: Needs assistance ?Equipment used: Rolling walker (2 wheels) ?Transfers: Sit to/from Stand, Bed to chair/wheelchair/BSC ?Sit to Stand: Min assist ?  ?Step pivot transfers: Min assist ?  ?  ?  ?General transfer comment: Cues provided to scoot anteriorly and to push up with 1 UE from seated surface to RW, minA to power up to stand 1x from EOB and 1x from commode. ?  ? ?Ambulation/Gait ?Ambulation/Gait assistance: Min assist, Min guard ?Gait Distance (Feet): 20 Feet ?Assistive device: Rolling walker (2 wheels) ?Gait Pattern/deviations: Step-to pattern, Decreased step length - left, Decreased stance time - right, Decreased dorsiflexion - left, Antalgic, Trunk flexed ?Gait velocity: reduced ?  ?  ?General Gait Details: Pt with slow, antalgic gait pattern, displaying increased difficulty weight bearing on R and advancing step on L. Poor L foot clearance. Fair proximity to RW, at times needs assist to advance/turn RW due to pain/fatigue; impulsive to sit once nearing proxmity to bed due to pain/fatigue. no LOB ? ? ? ?  ?Balance Overall balance assessment:  Needs assistance ?Sitting-balance support: Feet supported ?Sitting balance-Leahy Scale:  Good ?Sitting balance - Comments: No LOB sitting EOB without UE support ?  ?Standing balance support: Reliant on assistive device for balance, Bilateral upper extremity supported ?Standing balance-Leahy Scale: Fair ?Standing balance comment: Reliant on UE support for static standing and gait but able to stand at [facility] with Supervision, needing external assist for safety with dynamic standing tasks ?  ?  ?  ?  ?  ?  ?  ?  ?  ?  ?  ?  ? ?  ?Cognition Arousal/Alertness: Awake/alert ?Behavior During Therapy: [facility] for tasks assessed/performed, Flat affect ?Overall Cognitive Status: No family/caregiver present to determine baseline cognitive functioning ?Area of Impairment: Problem solving ?  ?  ?  ?  ?  ?  ?  ?  ?  ?  ?  ?  ?  ?  ?Problem Solving: Slow processing, Difficulty sequencing, Requires verbal cues ?General Comments: Pt needing encouragement throughout session. Pt soft spoken and with increased processing time. Needs repeated cues to sequence set-up for transfers and to problem solve how/when to turn walker, cues for pursed-lip breathing technique. ?  ?  ? ?  ?Exercises   ? ?  ?General Comments General comments (skin integrity, edema, etc.): HR 99-110 bpm with exertion, cues for PLB techniques; not lightheaded ?  ?  ? ?Pertinent Vitals/Pain Pain Assessment ?Pain Assessment: Faces ?Faces Pain Scale: Hurts even more ?Pain Location: lower back ?Pain Descriptors / Indicators: Grimacing, Guarding, Discomfort ?Pain Intervention(s): Monitored during session, Repositioned, Patient requesting pain meds-RN notified (pt had pain meds ~2h 40 mins prior but can have pain meds Q2H so RN notified)  ? ? ?   ?   ? ?PT Goals (current goals can now be found in the care plan section) Acute Rehab PT Goals ?Patient Stated Goal: to walk again ?PT Goal Formulation: With patient ?Time For Goal Achievement: 09/01/21 ?Progress towards PT goals: Progressing toward goals ? ?  ?Frequency ? ? ? Min 3X/week ? ? ? ?  ?PT Plan Current plan remains  appropriate  ? ? ?   ?AM-PAC PT "6 Clicks" Mobility   ?Outcome Measure ? Help needed turning from your back to your side while in a flat bed without using bedrails?: A Lot ?Help needed moving from lying on your back to sitting on the side of a flat bed without using bedrails?: A Little ?Help needed moving to and from a bed to a chair (including a wheelchair)?: A Little ?Help needed standing up from a chair using your arms (e.g., wheelchair or bedside chair)?: A Little ?Help needed to walk in hospital room?: A Little ?Help needed climbing 3-5 steps with a railing? : Total ?6 Click Score: 15 ? ?  ?End of Session Equipment Utilized During Treatment: Back brace ?Activity Tolerance: Patient limited by pain;Patient tolerated treatment well ?Patient left: in bed;with call bell/phone within reach;with bed alarm set;Other (comment) (heels floated) ?Nurse Communication: Mobility status;Other (comment);Patient requests pain meds (back brace on for mobility) ?PT Visit Diagnosis: Other abnormalities of gait and mobility (R26.89);Muscle weakness (generalized) (M62.81);Difficulty in walking, not elsewhere classified (R26.2);Unsteadiness on feet (R26.81);Pain ?Pain - part of body:  (back) ?  ? ? ?Time: [id number] ?PT Time Calculation (min) (ACUTE ONLY): 24 min ? ?Charges:  $Gait Training: 8-22 mins ?$Therapeutic Activity: 8-22 mins          ?          ? ?Shiesha Jahn P., PTA ?Acute Rehabilitation Services ?Secure  Chat Preferred 9a-5:30pm ?Office: [phone]  ? ? ?Latoya [name] ?08/21/2021, 12:27 PM ? ?

## 2021-08-21 NOTE — Progress Notes (Signed)
Pharmacy Antibiotic Note ? ?Latoya [name] is a 36 y.o. female admitted on 08/02/2021 with MRSA bacteremia-  septic pulmonary emboli/endocarditis. Pharmacy has been consulted for Vancomycin dosing. Scr is at baseline of 0.6 with estimated CrCl > 100 ml/min.  ? ?Vancomycin levels showed a peak of 30 and a trough of 14 on Vancomycin 1000mg  IV q12h.  Calculated AUC 600 which is at the upper end of goal range (450-600).  Will continue current dose for now and recheck levels  ? ? ?Plan: ?-  Vancomycin to 1000 mg every 12 hours - Predicted AUC 525 with Cmin 11.2 ?- Will continue to follow renal function, culture results, LOT, and antibiotic plans ? ?Height: 5\' 6"  (167.6 cm) ?Weight: 87.5 kg (192 lb 14.4 oz) ?IBW/kg (Calculated) : 59.3 ? ?Temp (24hrs), Avg:98 ?F (36.7 ?C), Min:97.6 ?F (36.4 ?C), Max:98.3 ?F (36.8 ?C) ? ?Recent Labs  ?Lab 08/14/21 ?1800 08/15/21 ?10/14/21 08/16/21 ?0230 08/19/21 ?0530 08/20/21 ?2128 08/21/21 ?0530  ?WBC  --  10.9* 12.9*  --   --   --   ?CREATININE  --  0.53 0.58 0.69  --   --   ?VANCOTROUGH 15  --   --   --   --  14*  ?VANCOPEAK  --   --   --   --  30  --   ? ?  ?Estimated Creatinine Clearance: 110.4 mL/min (by C-G formula based on SCr of 0.69 mg/dL).   ? ?Allergies  ?Allergen Reactions  ? Naltrexone Anxiety  ?  Severe anxiety, restlessness, vomiting  ? ? ?Antimicrobials this admission: ?Cefepime 3/29 >> 3/30 ?Flagyl 3/29 >> 3/30 ?Vancomycin 3/29 >> ? ?Dose adjustments this admission: ?4/3-4/4 VP 38 VT 14- AUC 697 >>Adj to 750 q 8 hours  ?4/10: VP 29, VT 15, AUC 591.8, Cmin 12.5 >> 1gm IV q12h for calculated AUC 525.5 ? ?Microbiology results: ?3/29 Influenza/COVID>> neg ?3/29 MRSA PCR >> positive ?3/29 BCx >> GPC in 4/4 bottles (BCID MRSA) ?3/31 BCX: MRSA - only 1 set drawn  ?4/2 BCx: NG ? ?4/29 Pharm.D. CPP, BCPS ?Clinical Pharmacist ?[phone] ?08/21/2021 1:53 PM  ? ? ?**Pharmacist phone directory can be found on [email or web address] listed under [facility]. ? ?08/21/2021 1:48 PM ? ? ? ?

## 2021-08-22 DIAGNOSIS — I079 Rheumatic tricuspid valve disease, unspecified: Secondary | ICD-10-CM | POA: Diagnosis not present

## 2021-08-22 LAB — BASIC METABOLIC PANEL
Anion gap: 6 (ref 5–15)
Anion gap: 6 (ref 5–15)
BUN: 12 mg/dL (ref 6–20)
BUN: 12 mg/dL (ref 6–20)
CO2: 22 mmol/L (ref 22–32)
CO2: 27 mmol/L (ref 22–32)
Calcium: 7.2 mg/dL — ABNORMAL LOW (ref 8.9–10.3)
Calcium: 8.6 mg/dL — ABNORMAL LOW (ref 8.9–10.3)
Chloride: 99 mmol/L (ref 98–111)
Chloride: 108 mmol/L (ref 98–111)
Creatinine, Ser: 0.81 mg/dL (ref 0.44–1.00)
Creatinine, Ser: 0.74 mg/dL (ref 0.44–1.00)
GFR, Estimated: >60 mL/min (ref >60)
GFR, Estimated: >60 mL/min (ref >60)
Glucose, Bld: 555 mg/dL (ref 70–99)
Glucose, Bld: 115 mg/dL — ABNORMAL HIGH (ref 70–99)
Potassium: 3.1 mmol/L — ABNORMAL LOW (ref 3.5–5.1)
Potassium: 4 mmol/L (ref 3.5–5.1)
Sodium: 127 mmol/L — ABNORMAL LOW (ref 135–145)
Sodium: 141 mmol/L (ref 135–145)

## 2021-08-22 LAB — HEMOGLOBIN A1C
Hgb A1c MFr Bld: 5.1 % (ref 4.8–5.6)
Mean Plasma Glucose: 99.67 mg/dL

## 2021-08-22 LAB — GLUCOSE, CAPILLARY: Glucose-Capillary: 109 mg/dL — ABNORMAL HIGH (ref 70–99)

## 2021-08-22 MED ORDER — OXYCODONE HCL 5 MG PO TABS
7.5000 mg | ORAL_TABLET | ORAL | Status: DC | PRN
  Administered 2021-08-22 – 2021-08-29 (×17): 7.5 mg via ORAL
  Filled 2021-08-22 (×20): qty 2

## 2021-08-22 MED ORDER — BACLOFEN 10 MG PO TABS
10.0000 mg | ORAL_TABLET | Freq: Four times a day (QID) | ORAL | Status: DC
  Administered 2021-08-22 – 2021-09-07 (×63): 10 mg via ORAL
  Filled 2021-08-22 (×65): qty 1

## 2021-08-22 NOTE — Progress Notes (Signed)
OT Cancellation Note ? ?Patient Details ?Name: Latoya [name] ?MRN: [id number] ?DOB: 06/15/1985 ? ? ?Cancelled Treatment:    Reason Eval/Treat Not Completed: Other (comment) (Pt reported they can not complete anything at this time even with encouragement to participate.) ? ?Alphia Moh OTR/L  ?Acute Rehab Services  ?[phone] office number ?[phone] pager number ? ?Alphia Moh ?08/22/2021, 9:33 AM ?

## 2021-08-22 NOTE — Progress Notes (Signed)
[facility] ?PROGRESS NOTE ? ?Latoya [name] FFM:[id number] DOB: 07/09/1985 DOA: 08/02/2021 ?PCP: Pcp, No ? ?Status  ?Remains inpatient appropriate because:  ?Unsafe discharge plan.  Patient is inappropriate given history of IVDA to discharge home with PICC line for prolonged antibiotic therapy ? ?Barriers to discharge: ?Social: ?Known history of IVDA ?Clinical: ?Requirement for prolonged IV antibiotic therapy for current endocarditis ? ?Level of care:  ?Med-Surg ? ? ?Code Status: Full ?Family Communication: Patient ?DVT prophylaxis: Xarelto ?COVID vaccination status: Vaccination status unknown ? ? ?HPI: ?35/F with history of IV heroin abuse, history of MSSA tricuspid valve endocarditis in 20/20 treated with Ancef and anterior checked at [facility], history of MRSA discitis/osteomyelitis in 2021 treated with antibiotics presented to the ED with low back pain, cough, fevers.  MRI LS spine negative for infection ?-Found to have MRSA bacteremia with tricuspid valve vegetation, septic pulmonary emboli ?-TEE 4/4 noted large highly mobile 1.8 cm tricuspid valve vegetation with severe TR ?-T CTS consulted, plan for angio [facility] 4/7, transferred to [facility] for surgery ?-4/7 taken to the OR for angio VAC but procedure had to be aborted due to PFO ? ?Subjective: ?Tolerating decreased frequency of IV Dilaudid from every 2 to every 4 hours.  Explained yesterday rationale for doing so given the fact that IV medications are short acting and we are trying to switch her to longer acting oral medications to improve her overall pain control.  She is agreeable.  Somewhat better pain control with increase in Lyrica and changing Robaxin to baclofen but still having significant pain when up working with PT despite use of corset brace. ? ?Objective: ?Vitals:  ? 08/21/21 2336 08/22/21 0506  ?BP: 135/82 118/87  ?Pulse: 97 95  ?Resp: 18 18  ?Temp: 98.4 ?F (36.9 ?C) 98.4 ?F (36.9 ?C)  ?SpO2: 96% 99%  ? ? ?Intake/Output Summary (Last 24  hours) at 08/22/2021 0820 ?Last data filed at 08/21/2021 2040 ?Gross per 24 hour  ?Intake 5.17 ml  ?Output 1100 ml  ?Net -1094.83 ml  ? ?Filed Weights  ? 08/03/21 1036 08/08/21 1111 08/09/21 1607  ?Weight: 82.2 kg 82.2 kg 87.5 kg  ? ? ?Exam: ? ?Constitutional: NAD, continues to appear uncomfortable ?Respiratory: Room air, bilateral lung sounds clear to auscultation anteriorly.  Normal respiratory effort. No accessory muscle use.  ?Cardiovascular: Pulse regular, S1-S2, no peripheral edema.  Normotensive ?Abdomen: no tenderness, no masses palpated. Bowel sounds positive.  LBM 4/14 ?Neurologic: CN 2-12 grossly intact. Sensation intact, DTR normal. Strength 5/5 x all 4 extremities.  ?Psychiatric: Normal judgment and insight. Alert and oriented x 3. Normal mood.  ? ? ?Assessment/Plan: ?MRSA tricuspid valve endocarditis/Severe TR/Severe sepsis, poa ?-Sepsis physiology resolved ?-Continue IV vancomycin through 5/13 ?-Repeat blood cultures 4/2 considered negative, staph epi likely contaminant ?-ID following; WBC 12,900 with normal neutrophils slightly elevated absolute neutrophils but trend is downward ?-Angio vac attempted on 4/7 but was aborted due to PFO ?-We will likely need follow-up echocardiogram prior to discharge at least 5 to 7 days before antibiotics are completed on 5/13-patient appears to be a poor candidate for follow-up so would like to complete this before discharge ? ?Acute hyperglycemia ?Collect panel from earlier this morning revealed a glucose of 555 with a potassium of 3.1 and a sodium of 127 ?Repeat CBG without any treatment for above hyperglycemia was 109 ?Electrolyte panel was 141 with a potassium of 4.0 and a glucose of 115 so suspect initial electrolyte panel was a spurious result ?No history of diabetes in  the family according to patient and hemoglobin A1c was 5.1 ? ?History of lumbar osteomyelitis/persistent lumbar back pain/edition ? -Patient initially presented with severe lumbar back pain which  has been present for 2 years since her initial episode of osteomyelitis.  When asked if this is one of the reason she has continue to use drugs especially narcotics she stated yes ?-MRI completed at time of this admission demonstrated no findings suspicious for discitis or osteomyelitis ?-She does continue to have intermittent low-grade leukocytosis without fever. ?-At this time do not suspect new infection in lumbar spine but will continue to follow ?-Somewhat improved pain with the change in Robaxin to baclofen but not adequate.  We will increase Oxy IR from 5 mg to 10 mg.  We will increase baclofen frequency to 10 mg every 6 hours.  We will continue Lyrica 75 mg 3 times daily ?-Able to participate more consistently with corset brace but now that she is up moving around and using her back muscles she is having increased pain so Lyrica adjusted as above.  We will try to hold off on increasing narcotics ? ? ? ?A physical therapy consult is indicated based on the patient?s mobility assessment. ? ? ?Mobility Assessment (last 72 hours)   ? ? Mobility Assessment   ? ? Row Name 08/22/21 0400 08/21/21 2000 08/21/21 1100 08/20/21 1940 08/20/21 1600  ? Does patient have an order for bedrest or is patient medically unstable No - Continue assessment No - Continue assessment -- No - Continue assessment --  ? What is the highest level of mobility based on the progressive mobility assessment? Level 5 (Walks with assist in room/hall) - Balance while stepping forward/back and can walk in room with assist - Complete Level 5 (Walks with assist in room/hall) - Balance while stepping forward/back and can walk in room with assist - Complete Level 5 (Walks with assist in room/hall) - Balance while stepping forward/back and can walk in room with assist - Complete Level 5 (Walks with assist in room/hall) - Balance while stepping forward/back and can walk in room with assist - Complete Level 5 (Walks with assist in room/hall) - Balance  while stepping forward/back and can walk in room with assist - Complete  ? Is the above level different from baseline mobility prior to current illness? Yes - Recommend PT order Yes - Recommend PT order -- Yes - Recommend PT order --  ? ? Row Name 08/20/21 0800 08/19/21 2045  ?  ?  ?  ? Does patient have an order for bedrest or is patient medically unstable No - Continue assessment No - Continue assessment     ? What is the highest level of mobility based on the progressive mobility assessment? Level 5 (Walks with assist in room/hall) - Balance while stepping forward/back and can walk in room with assist - Complete Level 5 (Walks with assist in room/hall) - Balance while stepping forward/back and can walk in room with assist - Complete     ? Is the above level different from baseline mobility prior to current illness? Yes - Recommend PT order Yes - Recommend PT order     ? ?  ?  ? ?  ? ?Cough, septic pulmonary emboli ?-Antibiotics as above, add guaifenesin, Tessalon Perles ?Stable on room air ?-Chest x-ray 4/11 with multiple nodular densities are noted through out both lungs, including cavitary nodule in right midlung, consistent with septic emboli. ? ?Hyponatremia ?-Essentially resolved with sodium averaging between 133 and  137 ?  ?Iron deficiency anemia ?Iron panel with serum iron of 22, transferrin saturation 7, ferritin 51 and TIBC 312. ?-Given IV iron and now on oral iron replacement ?  ?IV drug abuse  ?-Counseled by previous attending physician ?  ?Opioid use disorder ?Continue pain control with oxycodone 5 mg p.o. q4h PRN moderate pain and Dilaudid 1 mg IV q2h PRN severe pain ?See above regarding the addition of Lyrica, Robaxin ?Continue Cymbalta ? ? ? ?Data Reviewed: ?Basic Metabolic Panel: ?Recent Labs  ?Lab 08/15/21 ?2633 08/16/21 ?0230 08/19/21 ?0530 08/22/21 ?0612  ?NA 136 134* 138 127*  ?K 4.2 4.0 4.5 3.1*  ?CL 106 101 104 99  ?CO2 24 27 28 22   ?GLUCOSE 98 108* 90 555*  ?BUN 11 10 18 12   ?CREATININE  0.53 0.58 0.69 0.81  ?CALCIUM 8.6* 8.6* 8.6* 7.2*  ? ?Liver Function Tests: ?No results for input(s): AST, ALT, ALKPHOS, BILITOT, PROT, ALBUMIN in the last 168 hours. ?No results for input(s): LIPASE, AMYL

## 2021-08-22 NOTE — Progress Notes (Signed)
Nutrition Follow-up ? ?DOCUMENTATION CODES:  ? ?Not applicable ? ?INTERVENTION:  ? ?Continue Boost Breeze po BID, each supplement provides 250 kcal and 9 grams of protein. ?Continue Ensure Enlive po once daily, each supplement provides 350 kcal and 20 grams of protein. ? ?NUTRITION DIAGNOSIS:  ? ?Increased nutrient needs related to acute illness as evidenced by estimated needs. ? ?Ongoing  ? ?GOAL:  ? ?Patient will meet greater than or equal to 90% of their needs ? ?Progressing  ? ?MONITOR:  ? ?PO intake, Supplement acceptance, Labs, Weight trends ? ?REASON FOR ASSESSMENT:  ? ?Consult ?Assessment of nutrition requirement/status ? ?ASSESSMENT:  ? ?36 y.o. 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 She presented to the ED with severe low back pain for several weeks and intermittent fevers at home. She was admitted with severe sepsis and endocarditis of tricuspid valve. ? ?PICC line in place for prolonged antibiotic therapy. ?  ?Patient is on a regular diet. ?Meal intakes documented at 50-100%. ?She is drinking Psychiatrist and/or Ensure supplements once or twice daily. ?Patient reports good intake of meals. ?She is ordering from the room service menu.  ? ?Labs reviewed.  ?CBG: 109 ? ?Medications reviewed and include ferrous gluconate, Mag-Ox, MVI with minerals, thiamine. ? ?Diet Order:   ?Diet Order   ? ?       ?  Diet regular Room service appropriate? Yes; Fluid consistency: Thin  Diet effective now       ?  ? ?  ?  ? ?  ? ? ?EDUCATION NEEDS:  ? ?Not appropriate for education at this time ? ?Skin:  Skin Assessment: Reviewed RN Assessment ? ?Last BM:  4/14 type 1 ? ?Height:  ? ?Ht Readings from Last 1 Encounters:  ?08/08/21 5\' 6"  (1.676 m)  ? ? ?Weight:  ? ?Wt Readings from Last 1 Encounters:  ?08/09/21 87.5 kg  ? ? ?Ideal Body Weight:  59.1 kg ? ?BMI:  Body mass  index is 31.14 kg/m?. ? ?Estimated Nutritional Needs:  ? ?Kcal:  2100-2400 kcal ? ?Protein:  105-120 grams ? ?Fluid:  >/= 2.3 L/day ? ? ? ?10/09/21 RD, LDN, CNSC ?Please refer to Amion for contact information.                                                       ? ?

## 2021-08-22 NOTE — Progress Notes (Signed)
Occupational Therapy Treatment ?Patient Details ?Name: Latoya [name] ?MRN: [id number] ?DOB: January 30, 1986 ?Today's Date: 08/22/2021 ? ? ?History of present illness [name] is a 36 y.o. female admitted 08/02/21 with back pain, hypotension, hyponatremia, leukocytosis and neutrophilia. Found to have bacteremia possible sepitc pulmonary emboli from endocarditis. PMH: IVDU, heroin abuse, ongoing. ?  ?OT comments ? Pt reported from prior trial they did not want to work with therapy as they could not sleep until 5am. Pt noted to make progress in session as was able to ambulate with FW and min guard to sink and completed 3/3 ADLS and then return to chair to attempt to increase in sitting tolerance. Pt was able to don lumbar corset with set up prior to mobility. Pt currently with functional limitations due to the deficits listed below (see OT Problem List).  Pt will benefit from skilled OT to increase their safety and independence with ADL and functional mobility for ADL to facilitate discharge to venue listed below.  ?  ? ?Recommendations for follow up therapy are one component of a multi-disciplinary discharge planning process, led by the attending physician.  Recommendations may be updated based on patient status, additional functional criteria and insurance authorization. ?   ?Follow Up Recommendations ? Acute inpatient rehab (3hours/day) (if they do not qualify SNF)  ?  ?Assistance Recommended at Discharge Frequent or constant Supervision/Assistance  ?Patient can return home with the following ? A lot of help with bathing/dressing/bathroom;Two people to help with walking and/or transfers;Assistance with cooking/housework;Direct supervision/assist for medications management;Assist for transportation;Help with stairs or ramp for entrance;Direct supervision/assist for financial management ?  ?Equipment Recommendations ?  (TBD)  ?  ?Recommendations for Other Services   ? ?  ?Precautions / Restrictions  Precautions ?Precautions: Fall;Back ?Precaution Booklet Issued: No ?Precaution Comments: Contact precs, monitor HR, pain management, reviewed spinal precautions for pain management ?Required Braces or Orthoses: Spinal Brace ?Spinal Brace: Lumbar corset ?Restrictions ?Weight Bearing Restrictions: No  ? ? ?  ? ?Mobility Bed Mobility ?Overal bed mobility: Needs Assistance ?Bed Mobility:  (Pt presented on [facility]) ?  ?  ?  ?  ?  ?  ?  ? ?Transfers ?Overall transfer level: Needs assistance ?Equipment used: Rolling walker (2 wheels) ?Transfers: Sit to/from Stand ?Sit to Stand: Min guard ?  ?  ?  ?  ?  ?  ?  ?  ?Balance Overall balance assessment: Needs assistance ?Sitting-balance support: Feet supported ?Sitting balance-Leahy Scale: Good ?  ?  ?Standing balance support: Bilateral upper extremity supported, During functional activity ?Standing balance-Leahy Scale: Fair ?Standing balance comment: with mobility requires FW ?  ?  ?  ?  ?  ?  ?  ?  ?  ?  ?  ?   ? ?ADL either performed or assessed with clinical judgement  ? ?ADL Overall ADL's : Needs assistance/impaired ?Eating/Feeding: Set up;Sitting ?  ?Grooming: Wash/dry hands;Wash/dry face;Oral care;Applying deodorant;Min guard;Standing ?  ?Upper Body Bathing: Min guard;Sitting ?  ?Lower Body Bathing: Maximal assistance;Sit to/from stand ?  ?Upper Body Dressing : Min guard;Cueing for sequencing;Sitting ?  ?Lower Body Dressing: Maximal assistance;Cueing for safety;Cueing for sequencing;Sit to/from stand ?  ?  ?  ?Toileting- Architect and Hygiene: Min guard;Cueing for sequencing;Sit to/from stand ?  ?  ?  ?Functional mobility during ADLs: Min guard;Rolling walker (2 wheels) ?General ADL Comments: Pt was able to ambulate to sink to complete ADLS in standing and back to chair with no seated rest break ?  ? ?Extremity/Trunk Assessment Upper  Extremity Assessment ?Upper Extremity Assessment: Overall WFL for tasks assessed ?  ?Lower Extremity Assessment ?Lower Extremity  Assessment: Defer to PT evaluation ?  ?  ?  ? ?Vision   ?  ?  ?Perception   ?  ?Praxis   ?  ? ?Cognition Arousal/Alertness: Awake/alert ?Behavior During Therapy: [facility] for tasks assessed/performed, Flat affect ?Overall Cognitive Status: No family/caregiver present to determine baseline cognitive functioning ?Area of Impairment: Problem solving ?  ?  ?  ?  ?  ?  ?  ?  ?  ?  ?Memory: Decreased short-term memory ?  ?Safety/Judgement: Decreased awareness of safety, Decreased awareness of deficits ?  ?Problem Solving: Slow processing, Difficulty sequencing, Requires verbal cues ?  ?  ?  ?   ?Exercises   ? ?  ?Shoulder Instructions   ? ? ?  ?General Comments HR max 118  ? ? ?Pertinent Vitals/ Pain       Pain Assessment ?Pain Assessment: 0-10 ?Pain Score: 8  ?Breathing: normal ?Negative Vocalization: none ?Facial Expression: smiling or inexpressive ?Body Language: relaxed ?Consolability: no need to console ?PAINAD Score: 0 ?Pain Location: lower back, R Le with ambulation ?Pain Descriptors / Indicators: Grimacing, Guarding, Discomfort ?Pain Intervention(s): Limited activity within patient's tolerance, Monitored during session, Repositioned ? ?Home Living   ?  ?  ?  ?  ?  ?  ?  ?  ?  ?  ?  ?  ?  ?  ?  ?  ?  ?  ? ?  ?Prior Functioning/Environment    ?  ?  ?  ?   ? ?Frequency ? Min 2X/week  ? ? ? ? ?  ?Progress Toward Goals ? ?OT Goals(current goals can now be found in the care plan section) ? Progress towards OT goals: Progressing toward goals ? ?Acute Rehab OT Goals ?Patient Stated Goal: to get hair looking better ?OT Goal Formulation: With patient ?Time For Goal Achievement: 09/05/21 ?Potential to Achieve Goals: Good ?ADL Goals ?Pt Will Perform Grooming: with modified independence;standing ?Pt Will Perform Lower Body Dressing: with mod assist;sit to/from stand ?Pt Will Transfer to Toilet: with supervision;ambulating;regular height toilet;grab bars ?Pt Will Perform Toileting - Clothing Manipulation and hygiene: with modified  independence;sit to/from stand  ?Plan Discharge plan remains appropriate   ? ?Co-evaluation ? ? ?   ?  ?  ?  ?  ? ?  ?AM-PAC OT "6 Clicks" Daily Activity     ?Outcome Measure ? ? Help from another person eating meals?: A Little ?Help from another person taking care of personal grooming?: A Little ?Help from another person toileting, which includes using toliet, bedpan, or urinal?: A Lot ?Help from another person bathing (including washing, rinsing, drying)?: A Lot ?Help from another person to put on and taking off regular upper body clothing?: A Little ?Help from another person to put on and taking off regular lower body clothing?: A Lot ?6 Click Score: 15 ? ?  ?End of Session Equipment Utilized During Treatment: Rolling walker (2 wheels) ? ?OT Visit Diagnosis: Muscle weakness (generalized) (M62.81);Pain ?Pain - Right/Left: Right ?Pain - part of body: Leg (back) ?  ?Activity Tolerance Patient tolerated treatment well ?  ?Patient Left in chair;with call bell/phone within reach ?  ?Nurse Communication Mobility status ?  ? ?   ? ?Time: 9892-1194 ?OT Time Calculation (min): 51 min ? ?Charges: OT General Charges ?$OT Visit: 1 Visit ?OT Treatments ?$Self Care/Home Management : 38-52 mins ? ?Alphia Moh OTR/L  ?Acute  Rehab Services  ?[phone] office number ?[phone] pager number ? ? ?Alphia MohKira  Maame Dack ?08/22/2021, 12:53 PM ?

## 2021-08-23 DIAGNOSIS — I079 Rheumatic tricuspid valve disease, unspecified: Secondary | ICD-10-CM | POA: Diagnosis not present

## 2021-08-23 NOTE — Progress Notes (Signed)
?PROGRESS NOTE ? ? ? ?Latoya [name]  [id number] DOB: 12-19-1985 DOA: 08/02/2021 ?PCP: Pcp, No  ?36 year old with a history of IV heroin abuse and MSSA tricuspid valve endocarditis 2020 as well as MRSA discitis and osteomyelitis 2021 who presented to the [facility] ED 08/02/2021 with low back pain cough and fevers.  MRI of the lumbar spine was negative for infection but further work-up revealed MRSA bacteremia with tricuspid valve vegetation and septic pulmonary emboli. ?Ultimately the patient was transferred to [facility] for TCTS eval.  She was taken to the OR for/7/23 for a planned angio VAC procedure, but the discovery of a significant PFO required the abortion of this procedure. ?-Has remained stable on IV vancomycin per ID recommendations, to complete therapy 5/13 ? ? ?Subjective: ?Feels okay, no specific complaints today ? ?Assessment and Plan: ?  ?MRSA tricuspid valve endocarditis with severe TR  ?septic pulmonary emboli  ?severe sepsis, poa ?Sepsis has resolved -continue IV vancomycin through 09/16/2021 per ID recommendations  ?-angio VAC attempted 4/7 but had to be aborted due to PFO  ?-plan for repeat TTE 5-7 days prior to discontinuation of antibiotics ?  ?Persistent chronic lumbar pain  ?-history of lumbar osteomyelitis ?Present for 2 years and related to prior episode of osteomyelitis -MRI this admission without evidence of discitis or osteomyelitis ?-Continue current regimen of oxycodone, Robaxin and Lyrica ?  ?IV drug abuse ?Makes it unsafe to pursue outpatient IV antibiotic therefore necessitating prolonged inpatient stay ?-Counseled ? ?DVT prophylaxis: Xarelto ?Code Status: Full code ?Family Communication: Discussed with patient in detail, no family at bedside ?Disposition Plan: Home pending completion of antibiotic therapy ? ?Consultants:  ?Infectious disease, T CTS ? ?Procedures:  ? ?Antimicrobials:  ? ? ?Objective: ?Vitals:  ? 08/22/21 2011 08/22/21 2339 08/23/21 0537 08/23/21 0800  ?BP:  118/81 127/76 134/85   ?Pulse: 97 98 88   ?Resp: 20 20 18 15   ?Temp: 98.5 ?F (36.9 ?C)  98.6 ?F (37 ?C)   ?TempSrc: Oral  Oral   ?SpO2: 97% 97% 98%   ?Weight:      ?Height:      ? ? ?Intake/Output Summary (Last 24 hours) at 08/23/2021 1213 ?Last data filed at 08/23/2021 1010 ?Gross per 24 hour  ?Intake 1274.83 ml  ?Output 100 ml  ?Net 1174.83 ml  ? ?Filed Weights  ? 08/03/21 1036 08/08/21 1111 08/09/21 1607  ?Weight: 82.2 kg 82.2 kg 87.5 kg  ? ? ?Examination: ? ?General exam: Appears calm and comfortable  ?Respiratory system: Clear to auscultation ?Cardiovascular system: S1 & S2 heard, RRR.  ?Abd: nondistended, soft and nontender.Normal bowel sounds heard. ?Central nervous system: Alert and oriented. No focal neurological deficits. ?Extremities: trace edema ?Skin: No rashes ?Psychiatry:  Mood & affect appropriate.  ? ? ? ?Data Reviewed:  ? ?CBC: ?No results for input(s): WBC, NEUTROABS, HGB, HCT, MCV, PLT in the last 168 hours. ?Basic Metabolic Panel: ?Recent Labs  ?Lab 08/19/21 ?0530 08/22/21 ?0612 08/22/21 ?0945  ?NA 138 127* 141  ?K 4.5 3.1* 4.0  ?CL 104 99 108  ?CO2 28 22 27   ?GLUCOSE 90 555* 115*  ?BUN 18 12 12   ?CREATININE 0.69 0.81 0.74  ?CALCIUM 8.6* 7.2* 8.6*  ? ?GFR: ?Estimated Creatinine Clearance: 110.4 mL/min (by C-G formula based on SCr of 0.74 mg/dL). ?Liver Function Tests: ?No results for input(s): AST, ALT, ALKPHOS, BILITOT, PROT, ALBUMIN in the last 168 hours. ?No results for input(s): LIPASE, AMYLASE in the last 168 hours. ?No results for input(s): AMMONIA in the  last 168 hours. ?Coagulation Profile: ?No results for input(s): INR, PROTIME in the last 168 hours. ?Cardiac Enzymes: ?No results for input(s): CKTOTAL, CKMB, CKMBINDEX, TROPONINI in the last 168 hours. ?BNP (last 3 results) ?No results for input(s): PROBNP in the last 8760 hours. ?HbA1C: ?Recent Labs  ?  08/22/21 ?0945  ?HGBA1C 5.1  ? ?CBG: ?Recent Labs  ?Lab 08/22/21 ?0826  ?GLUCAP 109*  ? ?Lipid Profile: ?No results for input(s): CHOL,  HDL, LDLCALC, TRIG, CHOLHDL, LDLDIRECT in the last 72 hours. ?Thyroid Function Tests: ?No results for input(s): TSH, T4TOTAL, FREET4, T3FREE, THYROIDAB in the last 72 hours. ?Anemia Panel: ?No results for input(s): VITAMINB12, FOLATE, FERRITIN, TIBC, IRON, RETICCTPCT in the last 72 hours. ?Urine analysis: ?   ?Component Value Date/Time  ? [place] YELLOW 08/02/2021 2028  ? APPEARANCEUR HAZY (A) 08/02/2021 2028  ? LABSPEC 1.012 08/02/2021 2028  ? PHURINE 6.0 08/02/2021 2028  ? GLUCOSEU NEGATIVE 08/02/2021 2028  ? HGBUR SMALL (A) 08/02/2021 2028  ? [place] NEGATIVE 08/02/2021 2028  ? BILIRUBINUR negative 06/21/2021 1755  ? [place] NEGATIVE 08/02/2021 2028  ? PROTEINUR 30 (A) 08/02/2021 2028  ? UROBILINOGEN 0.2 06/21/2021 1755  ? NITRITE NEGATIVE 08/02/2021 2028  ? LEUKOCYTESUR SMALL (A) 08/02/2021 2028  ? ?Sepsis Labs: ?@LABRCNTIP (procalcitonin:4,lacticidven:4) ? ?)No results found for this or any previous visit (from the past 240 hour(s)).  ? ?Radiology Studies: ?No results found. ? ? ?Scheduled Meds: ? baclofen  10 mg Oral Q6H  ? Chlorhexidine Gluconate Cloth  6 each Topical Daily  ? DULoxetine  40 mg Oral Daily  ? feeding supplement  1 Container Oral BID BM  ? feeding supplement  237 mL Oral Q24H  ? ferrous gluconate  324 mg Oral Q breakfast  ? ibuprofen  600 mg Oral Q6H  ? magnesium oxide  400 mg Oral BID  ? multivitamin with minerals  1 tablet Oral Daily  ? nicotine  21 mg Transdermal Daily  ? pantoprazole  40 mg Oral Daily  ? pregabalin  75 mg Oral TID  ? rivaroxaban  10 mg Oral Q supper  ? thiamine  100 mg Oral Daily  ? ?Continuous Infusions: ? lactated ringers Stopped (08/11/21 1336)  ? vancomycin 200 mL/hr at 08/23/21 1010  ? ? ? LOS: 21 days  ? ? ?Time spent: 77min ? ? ? ?Domenic Polite, MD ?[facility] ? ? ?08/23/2021, 12:13 PM  ?  ?

## 2021-08-23 NOTE — Progress Notes (Signed)
Physical Therapy Treatment ?Patient Details ?Name: Latoya [name] ?MRN: [id number] ?DOB: 1985-09-26 ?Today's Date: 08/23/2021 ? ? ?History of Present Illness Latoya [name] is a 36 y.o. female admitted 08/02/21 with back pain, hypotension, hyponatremia, leukocytosis and neutrophilia. Found to have bacteremia possible sepitc pulmonary emboli from endocarditis. PMH: IVDU, heroin abuse, ongoing. ? ?  ?PT Comments  ? ? Pt received up in bathroom, therapist called back to room as no nursing staff nearby to assist pt, pt impulsively standing back up as therapist entered room. Pt needing Supervision for transfers from elevated toilet seat height>RW and RW>recliner and up to min guard for safety with gait trial in room. Pt agreeable to sit up in recliner with encouragement, mobility specialist notified pt would benefit from further mobility in room/hallway later in afternoon. Pt continues to benefit from PT services to progress toward functional mobility goals.    ?Recommendations for follow up therapy are one component of a multi-disciplinary discharge planning process, led by the attending physician.  Recommendations may be updated based on patient status, additional functional criteria and insurance authorization. ? ?Follow Up Recommendations ? Acute inpatient rehab (3hours/day) (if not AIR then SNF) ?  ?  ?Assistance Recommended at Discharge Frequent or constant Supervision/Assistance  ?Patient can return home with the following A little help with walking and/or transfers;A little help with bathing/dressing/bathroom;Assistance with cooking/housework;Direct supervision/assist for medications management;Assist for transportation;Help with stairs or ramp for entrance ?  ?Equipment Recommendations ? Rolling walker (2 wheels);BSC/3in1  ?  ?Recommendations for Other Services   ? ? ?  ?Precautions / Restrictions Precautions ?Precautions: Fall;Back (for pain management) ?Precaution Booklet Issued: No ?Precaution Comments: Contact  precs, monitor HR, pain management, reviewed spinal precautions for pain management ?Required Braces or Orthoses: Spinal Brace (for pain management) ?Spinal Brace: Lumbar corset ?Restrictions ?Weight Bearing Restrictions: No  ?  ? ?Mobility ? Bed Mobility ?Overal bed mobility: Needs Assistance ?Bed Mobility: Supine to Sit, Sit to Supine ?  ?  ?Supine to sit: Supervision, HOB elevated ?  ?  ?General bed mobility comments: pt received on toilet and remained up in recliner at end ?  ? ?Transfers ?Overall transfer level: Needs assistance ?Equipment used: Rolling walker (2 wheels) ?Transfers: Sit to/from Stand, Bed to chair/wheelchair/BSC ?Sit to Stand: Supervision ?  ?  ?  ?  ?  ?General transfer comment: Pt able to recall safe technique from previous sessions; toilet>RW and RW>recliner ?  ? ?Ambulation/Gait ?Ambulation/Gait assistance: Min guard ?Gait Distance (Feet): 26 Feet ?Assistive device: Rolling walker (2 wheels) ?Gait Pattern/deviations: Step-to pattern, Decreased step length - left, Decreased stance time - right, Decreased dorsiflexion - left, Antalgic, Trunk flexed ?Gait velocity: reduced ?  ?  ?General Gait Details: Pt with slow, antalgic gait pattern, displaying increased difficulty weight bearing on R and advancing step on L. Decreased L foot clearance. Good RW management today, min cues for awareness of IV lines, increased time on return from bathroom due to elevated pain score, cues for pursed-lip breathing needed; pt defers longer trial due to pain. ? ? ?Stairs ?  ?  ?  ?  ?  ? ? ?Wheelchair Mobility ?  ? ?Modified Rankin (Stroke Patients Only) ?  ? ? ?  ?Balance Overall balance assessment: Needs assistance ?Sitting-balance support: Feet supported ?Sitting balance-Leahy Scale: Good ?Sitting balance - Comments: No LOB sitting EOB without UE support ?  ?Standing balance support: Reliant on assistive device for balance, Bilateral upper extremity supported ?Standing balance-Leahy Scale: Fair ?Standing  balance comment: Pt needing  min guard for safety with dynamic standing tasks ?  ?  ?  ?  ?  ?  ?  ?  ?  ?  ?  ?  ? ?  ?Cognition Arousal/Alertness: Awake/alert ?Behavior During Therapy: [facility] for tasks assessed/performed, Anxious ?Overall Cognitive Status: No family/caregiver present to determine baseline cognitive functioning ?Area of Impairment: Problem solving ?  ?  ?  ?  ?  ?  ?  ?  ?  ?  ?  ?  ?  ?  ?Problem Solving: Slow processing, Difficulty sequencing, Requires verbal cues ?General Comments: Improved initiation and recall of safety with transfers, needs min cues for encouragement and prompting for progression of activity. ?  ?  ? ?  ?Exercises Other Exercises ?Other Exercises: seated BLE AROM: LAQ, ankle pumps x10 reps ea ? ?  ?General Comments General comments (skin integrity, edema, etc.): VSS on RA; HR to 116 bpm with exertion ?  ?  ? ?Pertinent Vitals/Pain Pain Assessment ?Pain Assessment: 0-10 ?Pain Score: 8  ?Pain Location: lower back with ambulation ?Pain Descriptors / Indicators: Grimacing, Guarding, Discomfort ?Pain Intervention(s): Monitored during session, Repositioned, Limited activity within patient's tolerance, Premedicated before session  ? ? ?Home Living   ?  ?  ?  ?  ?  ?  ?  ?  ?  ?   ?  ?Prior Function    ?  ?  ?   ? ?PT Goals (current goals can now be found in the care plan section) Acute Rehab PT Goals ?Patient Stated Goal: to walk again ?PT Goal Formulation: With patient ?Time For Goal Achievement: 09/01/21 ?Progress towards PT goals: Progressing toward goals ? ?  ?Frequency ? ? ? Min 3X/week ? ? ? ?  ?PT Plan Current plan remains appropriate  ? ? ?Co-evaluation   ?  ?  ?  ?  ? ?  ?AM-PAC PT "6 Clicks" Mobility   ?Outcome Measure ? Help needed turning from your back to your side while in a flat bed without using bedrails?: A Lot ?Help needed moving from lying on your back to sitting on the side of a flat bed without using bedrails?: A Little ?Help needed moving to and from a bed to a  chair (including a wheelchair)?: A Little ?Help needed standing up from a chair using your arms (e.g., wheelchair or bedside chair)?: A Little ?Help needed to walk in hospital room?: A Little ?Help needed climbing 3-5 steps with a railing? : Total ?6 Click Score: 15 ? ?  ?End of Session Equipment Utilized During Treatment: Back brace ?Activity Tolerance: Patient limited by pain;Patient tolerated treatment well ?Patient left: with call bell/phone within reach;in chair;with chair alarm set ?Nurse Communication: Mobility status;Other (comment) (back brace on for mobility) ?PT Visit Diagnosis: Other abnormalities of gait and mobility (R26.89);Muscle weakness (generalized) (M62.81);Difficulty in walking, not elsewhere classified (R26.2);Unsteadiness on feet (R26.81);Pain ?Pain - part of body:  (back) ?  ? ? ?Time: 0814-4818 ?PT Time Calculation (min) (ACUTE ONLY): 15 min ? ?Charges:  ?$Therapeutic Activity: 8-22 mins          ?          ? ?Erna Brossard P., PTA ?Acute Rehabilitation Services ?Secure Chat Preferred 9a-5:30pm ?Office: [phone]  ? ? ?Sol Englert M Norely Schlick ?08/23/2021, 5:27 PM ? ?

## 2021-08-23 NOTE — Progress Notes (Signed)
Physical Therapy Treatment ?Patient Details ?Name: Latoya [name] ?MRN: [id number] ?DOB: 06/02/1985 ?Today's Date: 08/23/2021 ? ? ?History of Present Illness Latoya [name] is a 36 y.o. female admitted 08/02/21 with back pain, hypotension, hyponatremia, leukocytosis and neutrophilia. Found to have bacteremia possible sepitc pulmonary emboli from endocarditis. PMH: IVDU, heroin abuse, ongoing. ? ?  ?PT Comments  ? ? Pt received in supine, agreeable to therapy session after premedication for pain and with good participation and improved tolerance for short gait task in room. Distance limited due to pt need for toileting and she reports she is able to perform her own peri-care, reviewed with pt use of wall pull bell once complete and pt agreeable, RN/NT notified. Pt needing up to min guard today to perform functional mobility tasks and increased time for transfers/gait due to pain. Pt continues to benefit from PT services to progress toward functional mobility goals.    ?Recommendations for follow up therapy are one component of a multi-disciplinary discharge planning process, led by the attending physician.  Recommendations may be updated based on patient status, additional functional criteria and insurance authorization. ? ?Follow Up Recommendations ? Acute inpatient rehab (3hours/day) (if not AIR then SNF) ?  ?  ?Assistance Recommended at Discharge Frequent or constant Supervision/Assistance  ?Patient can return home with the following A little help with walking and/or transfers;A little help with bathing/dressing/bathroom;Assistance with cooking/housework;Direct supervision/assist for medications management;Assist for transportation;Help with stairs or ramp for entrance ?  ?Equipment Recommendations ? Rolling walker (2 wheels);BSC/3in1  ?  ?Recommendations for Other Services   ? ? ?  ?Precautions / Restrictions Precautions ?Precautions: Fall;Back (for pain management) ?Precaution Booklet Issued: No ?Precaution  Comments: Contact precs, monitor HR, pain management, reviewed spinal precautions for pain management ?Required Braces or Orthoses: Spinal Brace (for pain management) ?Spinal Brace: Lumbar corset ?Restrictions ?Weight Bearing Restrictions: No  ?  ? ?Mobility ? Bed Mobility ?Overal bed mobility: Needs Assistance ?Bed Mobility: Supine to Sit, Sit to Supine ?  ?  ?Supine to sit: Supervision, HOB elevated ?  ?  ?General bed mobility comments: per pt she sleeps in recliner and log rolling is too painful to attempt; increased time to perform but pt not needing BLE assist to get to EOB ?  ? ?Transfers ?Overall transfer level: Needs assistance ?Equipment used: Rolling walker (2 wheels) ?Transfers: Sit to/from Stand, Bed to chair/wheelchair/BSC ?Sit to Stand: Min guard ?  ?  ?  ?  ?  ?General transfer comment: Pt able to recall safe technique from previous sessions; EOB>RW and RW>toilet ?  ? ?Ambulation/Gait ?Ambulation/Gait assistance: Min guard ?Gait Distance (Feet): 22 Feet ?Assistive device: Rolling walker (2 wheels) ?Gait Pattern/deviations: Step-to pattern, Decreased step length - left, Decreased stance time - right, Decreased dorsiflexion - left, Antalgic, Trunk flexed ?Gait velocity: reduced ?  ?  ?General Gait Details: Pt with slow, antalgic gait pattern, displaying increased difficulty weight bearing on R and advancing step on L. Decreased L foot clearance. Good RW management today, min cues for awareness of IV lines ? ? ?  ?Balance Overall balance assessment: Needs assistance ?Sitting-balance support: Feet supported ?Sitting balance-Leahy Scale: Good ?Sitting balance - Comments: No LOB sitting EOB without UE support ?  ?Standing balance support: Reliant on assistive device for balance, Bilateral upper extremity supported ?Standing balance-Leahy Scale: Fair ?Standing balance comment: Reliant on UE support for static standing and gait but able to stand at [facility] with Supervision, needing min guard for safety with  dynamic standing tasks ?  ?  ?  ?  ?  Cognition Arousal/Alertness: Awake/alert ?Behavior During Therapy: [facility] for tasks assessed/performed, Anxious ?Overall Cognitive Status: No family/caregiver present to determine baseline cognitive functioning ?Area of Impairment: Problem solving ?  ?  ?  ?   ?Problem Solving: Slow processing, Difficulty sequencing, Requires verbal cues ?General Comments: Improved initiation and recall of safety with transfers, needs min cues for encouragement and prompting for progression of activity. Pt less self-limiting than previous sessions but requiring increased time still due to high pain in back/bottom. ?  ?  ? ?  ?Exercises Other Exercises ?Other Exercises: seated BLE AROM: LAQ, ankle pumps x10 reps ea ? ?  ?General Comments General comments (skin integrity, edema, etc.): VSS on RA ?  ?  ? ?Pertinent Vitals/Pain Pain Assessment ?Pain Assessment: 0-10 ?Pain Score: 7  ?Pain Location: lower back with ambulation, bottom pain while sitting EOB due to "raw skin" ?Pain Descriptors / Indicators: Grimacing, Guarding, Discomfort ?Pain Intervention(s): Monitored during session, Repositioned, Premedicated before session  ? ? ? ?PT Goals (current goals can now be found in the care plan section) Acute Rehab PT Goals ?Patient Stated Goal: to walk again ?PT Goal Formulation: With patient ?Time For Goal Achievement: 09/01/21 ?Progress towards PT goals: Progressing toward goals ? ?  ?Frequency ? ? ? Min 3X/week ? ? ? ?  ?PT Plan Current plan remains appropriate  ? ? ?   ?AM-PAC PT "6 Clicks" Mobility   ?Outcome Measure ? Help needed turning from your back to your side while in a flat bed without using bedrails?: A Lot ?Help needed moving from lying on your back to sitting on the side of a flat bed without using bedrails?: A Little ?Help needed moving to and from a bed to a chair (including a wheelchair)?: A Little ?Help needed standing up from a chair using your arms (e.g., wheelchair or bedside chair)?: A  Little ?Help needed to walk in hospital room?: A Little ?Help needed climbing 3-5 steps with a railing? : Total ?6 Click Score: 15 ? ?  ?End of Session Equipment Utilized During Treatment: Back brace ?Activity Tolerance: Patient limited by pain;Patient tolerated treatment well ?Patient left: with call bell/phone within reach;in chair ?Nurse Communication: Mobility status;Other (comment);Patient requests pain meds (back brace on for mobility) ?PT Visit Diagnosis: Other abnormalities of gait and mobility (R26.89);Muscle weakness (generalized) (M62.81);Difficulty in walking, not elsewhere classified (R26.2);Unsteadiness on feet (R26.81);Pain ?Pain - part of body:  (back) ?  ? ? ?Time: 1829-9371 ?PT Time Calculation (min) (ACUTE ONLY): 20 min ? ?Charges:  $Gait Training: 8-22 mins          ?          ? ?Leandria Thier P., PTA ?Acute Rehabilitation Services ?Secure Chat Preferred 9a-5:30pm ?Office: [phone]  ? ? ?Amberleigh Gerken M Malayia Spizzirri ?08/23/2021, 5:15 PM ? ?

## 2021-08-23 NOTE — Progress Notes (Signed)
Patient remains hospitalized for IV antibiotics through 09/16/2021. There are no other TOC needs known at this time, if needs arise please notify DTP staff. ? ?Edwin Dada, MSW, LCSW ?Transitions of Care  Clinical Social Worker II ?[phone] ? ?

## 2021-08-23 NOTE — Progress Notes (Signed)
Mobility Specialist Progress Note ? ? 08/23/21 1851  ?Mobility  ?Activity  ?(Bed Exercises)  ?Range of Motion/Exercises All extremities  ?Level of Assistance Independent  ?Assistive Device None  ?Activity Response Tolerated well  ?$Mobility charge 1 Mobility  ? ?Received pt in bed c/o LBP and refusing ambulation, but agreeable to bed exercises. Pt presenting w/ gen weakness during but able to complete every task. Left w/ RN in room and notified about pain ? ?Frederico Hamman ?Mobility Specialist ?Phone Number [phone] ? ?

## 2021-08-24 MED ORDER — GERHARDT'S BUTT CREAM
TOPICAL_CREAM | Freq: Two times a day (BID) | CUTANEOUS | Status: DC
  Administered 2021-08-24 – 2021-09-10 (×9): 1 via TOPICAL
  Filled 2021-08-24 (×3): qty 1

## 2021-08-24 NOTE — Progress Notes (Signed)
Patient seen and examined, no changes from my note yesterday ?-Stable, continue IV vancomycin until 5/13 to complete course ? ?Zannie Cove, MD ?

## 2021-08-24 NOTE — Progress Notes (Signed)
Mobility Specialist Progress Note ? ? 08/24/21 1221  ?Mobility  ?Activity Ambulated with assistance in room  ?Level of Assistance Modified independent, requires aide device or extra time  ?Assistive Device Front wheel walker  ?Distance Ambulated (ft) 70 ft  ?Activity Response Tolerated well  ?$Mobility charge 1 Mobility  ? ?Pre Mobility: 107 HR ?During Mobility: 115 HR ?Post Mobility: 101HR, 111/92 BP ? ?Received pt in bed having no complaints and agreeable. Independent for bed mobility and requiring minA to don back brace. Pt declining hallway ambulation but capable of walking hallway distances in room. Seems to express more mental hindrance than physical capability. Mod I for remainder of ambulation and tx to chair, no reported symptoms. Left in chair w/ call bell in reach and chair alarm on.  ? ?Frederico Hamman ?Mobility Specialist ?Phone Number [phone] ? ?

## 2021-08-24 NOTE — Plan of Care (Signed)

## 2021-08-24 NOTE — Consult Note (Addendum)
WOC Nurse Consult Note: ?Reason for Consult:Moisture associated skin damage (MASD)to bilateral gluteal folds.   She is able to independently stand so I can assess the area.  ?Wound type:MASD ?Pressure Injury POA: NA ?Measurement:Each gluteal fold 7 cm x 3 cm x 0.1 cm  ?Wound DJT:TSVX and moist ?Drainage (amount, consistency, odor) scant weeping ?Periwound:frequently moist due to perspiration.  Skin is dry all over, hair [place] unkempt.  I offer to get patient assistance with shampoo cap so her hair will not continue to tangle and mat.  She says her mother is bringing her supplies.  I reinforce that we can help her with this if that doesn't happen.  She acknowledges and is thankful.  There is emoillient lotion at the bedside and I encourage patient to apply or ask for assistance with this if she would like.  She declines at this time. Her buttocks are uncomfortable and she agrees to GErhardts butt paste for this area once it arrives from pharmacy.  ?Dressing procedure/placement/frequency:Moisturize skin each shift as allowed.  ?Cleanse buttocks with soap and water and pat gently dry. Apply Gerhardts butt paste each shift and PRN soilage. OPen to air.  ?Will not follow at this time.  Please re-consult if needed.  ?Maple Hudson MSN, RN, FNP-BC CWON ?Wound, Ostomy, Continence Nurse ?Pager [phone]  ? ?  ?

## 2021-08-25 DIAGNOSIS — I079 Rheumatic tricuspid valve disease, unspecified: Secondary | ICD-10-CM | POA: Diagnosis not present

## 2021-08-25 LAB — BASIC METABOLIC PANEL
Anion gap: 6 (ref 5–15)
BUN: 22 mg/dL — ABNORMAL HIGH (ref 6–20)
CO2: 27 mmol/L (ref 22–32)
Calcium: 8.5 mg/dL — ABNORMAL LOW (ref 8.9–10.3)
Chloride: 108 mmol/L (ref 98–111)
Creatinine, Ser: 0.78 mg/dL (ref 0.44–1.00)
GFR, Estimated: >60 mL/min (ref >60)
Glucose, Bld: 106 mg/dL — ABNORMAL HIGH (ref 70–99)
Potassium: 4.1 mmol/L (ref 3.5–5.1)
Sodium: 141 mmol/L (ref 135–145)

## 2021-08-25 MED ORDER — IBUPROFEN 400 MG PO TABS: 400.0000 mg | ORAL_TABLET | Freq: Four times a day (QID) | ORAL | Status: DC | PRN

## 2021-08-25 NOTE — Progress Notes (Signed)
Physical Therapy Treatment ?Patient Details ?Name: Latoya [name] ?MRN: [id number] ?DOB: [birth year] ?Today's Date: 08/25/2021 ? ? ?History of Present Illness Latoya [name] is a 36 y.o. female admitted 08/02/21 with back pain, hypotension, hyponatremia, leukocytosis and neutrophilia. Found to have bacteremia possible sepitc pulmonary emboli from endocarditis. PMH: IVDU, heroin abuse, ongoing. ? ?  ?PT Comments  ? ? Pt is making great, steady progress with mobility, ambulating up to ~120 ft at a min guard-supervision level with a RW today. She continues to display bil lower extremity strength deficits, especially  on her L, impacting her independence and ease with transfers and ability to clear her L foot and advance it with gait. Pt benefited from cues to exaggerate marching while ambulating to facilitate improved feet clearance and heel strike. Pt agreed to sit up in chair for 1 hour today. Will continue to follow acutely. Current recommendations remain appropriate. ? ?  ?Recommendations for follow up therapy are one component of a multi-disciplinary discharge planning process, led by the attending physician.  Recommendations may be updated based on patient status, additional functional criteria and insurance authorization. ? ?Follow Up Recommendations ? Acute inpatient rehab (3hours/day) (if not AIR then SNF) ?  ?  ?Assistance Recommended at Discharge Intermittent Supervision/Assistance  ?Patient can return home with the following A little help with walking and/or transfers;A little help with bathing/dressing/bathroom;Assistance with cooking/housework;Direct supervision/assist for medications management;Assist for transportation;Help with stairs or ramp for entrance ?  ?Equipment Recommendations ? Rolling walker (2 wheels);BSC/3in1  ?  ?Recommendations for Other Services   ? ? ?  ?Precautions / Restrictions Precautions ?Precautions: Fall;Back (for pain management) ?Precaution Booklet Issued: No ?Precaution Comments:  Contact precs, monitor HR, pain management, reviewed spinal precautions for pain management ?Required Braces or Orthoses: Spinal Brace (for pain management) ?Spinal Brace: Lumbar corset ?Restrictions ?Weight Bearing Restrictions: No  ?  ? ?Mobility ? Bed Mobility ?  ?  ?  ?  ?  ?  ?  ?General bed mobility comments: Pt sitting up EOB upon arrival. ?  ? ?Transfers ?Overall transfer level: Needs assistance ?Equipment used: Rolling walker (2 wheels) ?Transfers: Sit to/from Stand, Bed to chair/wheelchair/BSC ?Sit to Stand: Supervision ?  ?Step pivot transfers: Supervision ?  ?  ?  ?General transfer comment: Supervision for safety with pt correcting hand placement without cues, no LOB, extra time to power up to stand and perform stand step to L bed > commode. ?  ? ?Ambulation/Gait ?Ambulation/Gait assistance: Min guard, Supervision ?Gait Distance (Feet): 120 Feet ?Assistive device: Rolling walker (2 wheels) ?Gait Pattern/deviations: Step-to pattern, Decreased step length - left, Decreased stance time - right, Decreased dorsiflexion - left, Antalgic, Trunk flexed ?Gait velocity: reduced ?Gait velocity interpretation: <1.31 ft/sec, indicative of household ambulator ?  ?General Gait Details: Pt with slow, antalgic gait pattern, displaying increased difficulty weight bearing on R and advancing step on L. Decreased L foot clearance. Cues provided to exaggerate marching when taking steps to improve feet clearance and heel strike, good momentary success noted. No LOB, min guard-supervision for safety. ? ? ?Stairs ?  ?  ?  ?  ?  ? ? ?Wheelchair Mobility ?  ? ?Modified Rankin (Stroke Patients Only) ?  ? ? ?  ?Balance Overall balance assessment: Needs assistance ?Sitting-balance support: Feet supported, No upper extremity supported ?Sitting balance-Leahy Scale: Good ?Sitting balance - Comments: No LOB sitting EOB without UE support ?  ?Standing balance support: Bilateral upper extremity supported, Reliant on assistive device for  balance, During functional activity ?  Standing balance-Leahy Scale: Poor ?Standing balance comment: Reliant on RW for mobility ?  ?  ?  ?  ?  ?  ?  ?  ?  ?  ?  ?  ? ?  ?Cognition Arousal/Alertness: Awake/alert ?Behavior During Therapy: [facility] for tasks assessed/performed, Anxious ?Overall Cognitive Status: Within Functional Limits for tasks assessed ?  ?  ?  ?  ?  ?  ?  ?  ?  ?  ?  ?  ?  ?  ?  ?  ?General Comments: Appropriately follows cues and sequences tasks with less cues. ?  ?  ? ?  ?Exercises   ? ?  ?General Comments General comments (skin integrity, edema, etc.): VSS on RA; HR up to 120s; noted blood with BM, notified RN ?  ?  ? ?Pertinent Vitals/Pain Pain Assessment ?Pain Assessment: Faces ?Faces Pain Scale: Hurts a little bit ?Pain Location: low back and bil legs ?Pain Descriptors / Indicators: Grimacing, Guarding, Shooting ?Pain Intervention(s): Limited activity within patient's tolerance, Monitored during session, Repositioned  ? ? ?Home Living   ?  ?  ?  ?  ?  ?  ?  ?  ?  ?   ?  ?Prior Function    ?  ?  ?   ? ?PT Goals (current goals can now be found in the care plan section) Acute Rehab PT Goals ?Patient Stated Goal: to improve ?PT Goal Formulation: With patient ?Time For Goal Achievement: 09/01/21 ?Potential to Achieve Goals: Fair ?Progress towards PT goals: Progressing toward goals ? ?  ?Frequency ? ? ? Min 3X/week ? ? ? ?  ?PT Plan Current plan remains appropriate  ? ? ?Co-evaluation   ?  ?  ?  ?  ? ?  ?AM-PAC PT "6 Clicks" Mobility   ?Outcome Measure ? Help needed turning from your back to your side while in a flat bed without using bedrails?: A Little ?Help needed moving from lying on your back to sitting on the side of a flat bed without using bedrails?: A Little ?Help needed moving to and from a bed to a chair (including a wheelchair)?: A Little ?Help needed standing up from a chair using your arms (e.g., wheelchair or bedside chair)?: A Little ?Help needed to walk in hospital room?: A Little ?Help  needed climbing 3-5 steps with a railing? : A Lot ?6 Click Score: 17 ? ?  ?End of Session Equipment Utilized During Treatment: Back brace ?Activity Tolerance: Patient tolerated treatment well ?Patient left: in chair;with call bell/phone within reach;with chair alarm set ?Nurse Communication: Mobility status;Other (comment) (blood with BM noted) ?PT Visit Diagnosis: Other abnormalities of gait and mobility (R26.89);Muscle weakness (generalized) (M62.81);Difficulty in walking, not elsewhere classified (R26.2);Unsteadiness on feet (R26.81);Pain ?Pain - Right/Left:  (bil) ?Pain - part of body: Leg (back) ?  ? ? ?Time: 0175-1025 ?PT Time Calculation (min) (ACUTE ONLY): 42 min ? ?Charges:  $Gait Training: 23-37 mins ?$Therapeutic Activity: 8-22 mins          ?          ? ?Raymond Gurney, PT, DPT ?Acute Rehabilitation Services  ?Pager: [phone] ?Office: [phone] ? ? ? ?Henrene Dodge Pettis ?08/25/2021, 1:59 PM ? ?

## 2021-08-25 NOTE — Progress Notes (Signed)
?PROGRESS NOTE ? ? ? ?Latoya [name]  MKL:[id number] DOB: November 30, 1985 DOA: 08/02/2021 ?PCP: Pcp, No  ?35/F w/ history of IV heroin abuse and MSSA tricuspid valve endocarditis 2020 as well as MRSA discitis and osteomyelitis 2021 who presented to the [facility] ED 08/02/2021 with low back pain cough and fevers.  MRI of the lumbar spine was negative for infection but further work-up revealed MRSA bacteremia with tricuspid valve vegetation and septic pulmonary emboli. ?Ultimately the patient was transferred to [facility] for TCTS eval.  She was taken to the OR for/7/23 for a planned angio VAC procedure, but the discovery of a significant PFO required the abortion of this procedure. ?-Has remained stable on IV vancomycin per ID recommendations, to complete therapy 5/13 ? ? ?Subjective: ?Feels okay, denies any complaints, ambulating in the room ? ?Assessment and Plan: ?  ?MRSA tricuspid valve endocarditis with severe TR  ?septic pulmonary emboli  ?severe sepsis, poa ?Sepsis has resolved ? -continue IV vancomycin through 09/16/2021 per ID recommendations  ?-angio VAC attempted 4/7 but had to be aborted due to PFO  ?-plan for repeat TTE 5-7 days prior to discontinuation of antibiotics ?  ?Persistent chronic lumbar pain  ?-history of lumbar osteomyelitis ?Present for 2 years and related to prior episode of osteomyelitis  ?-MRI this admission without evidence of discitis or osteomyelitis ?-Continue current regimen of oxycodone, Robaxin and Lyrica ?  ?IV drug abuse ?Makes it unsafe to pursue outpatient IV antibiotic therefore necessitating prolonged inpatient stay ?-Counseled ? ?DVT prophylaxis: Xarelto ?Code Status: Full code ?Family Communication: Discussed with patient in detail, no family at bedside ?Disposition Plan: Home pending completion of antibiotic therapy ? ?Consultants:  ?Infectious disease, T CTS ? ? ? ?Objective: ?Vitals:  ? 08/25/21 0429 08/25/21 0451 08/25/21 0812 08/25/21 1021  ?BP: 96/75 102/85 99/78   ?Pulse:  80  77   ?Resp: 13 16 17 15   ?Temp: 98.1 ?F (36.7 ?C) 98.1 ?F (36.7 ?C) 97.8 ?F (36.6 ?C)   ?TempSrc: Oral Oral Oral   ?SpO2: 100% 100% 98%   ?Weight:      ?Height:      ? ? ?Intake/Output Summary (Last 24 hours) at 08/25/2021 1131 ?Last data filed at 08/25/2021 0900 ?Gross per 24 hour  ?Intake 240 ml  ?Output --  ?Net 240 ml  ? ?Filed Weights  ? 08/03/21 1036 08/08/21 1111 08/09/21 1607  ?Weight: 82.2 kg 82.2 kg 87.5 kg  ? ? ?Examination: ? ?General exam: Pleasant young female laying in bed, awake alert, oriented x3, flat affect, no distress ?CVS: S1-S2, regular rhythm, systolic murmur ?Lungs: Clear bilaterally ?Abdomen: Soft, nontender, bowel sounds present ?Extremities: Trace edema  ?Psychiatry: Flat affect ? ? ? ?Data Reviewed:  ? ?CBC: ?No results for input(s): WBC, NEUTROABS, HGB, HCT, MCV, PLT in the last 168 hours. ?Basic Metabolic Panel: ?Recent Labs  ?Lab 08/19/21 ?0530 08/22/21 ?0612 08/22/21 ?0945 08/25/21 ?0510  ?NA 138 127* 141 141  ?K 4.5 3.1* 4.0 4.1  ?CL 104 99 108 108  ?CO2 28 22 27 27   ?GLUCOSE 90 555* 115* 106*  ?BUN 18 12 12  22*  ?CREATININE 0.69 0.81 0.74 0.78  ?CALCIUM 8.6* 7.2* 8.6* 8.5*  ? ?GFR: ?Estimated Creatinine Clearance: 110.4 mL/min (by C-G formula based on SCr of 0.78 mg/dL). ?Liver Function Tests: ?No results for input(s): AST, ALT, ALKPHOS, BILITOT, PROT, ALBUMIN in the last 168 hours. ?No results for input(s): LIPASE, AMYLASE in the last 168 hours. ?No results for input(s): AMMONIA in the last 168  hours. ?Coagulation Profile: ?No results for input(s): INR, PROTIME in the last 168 hours. ?Cardiac Enzymes: ?No results for input(s): CKTOTAL, CKMB, CKMBINDEX, TROPONINI in the last 168 hours. ?BNP (last 3 results) ?No results for input(s): PROBNP in the last 8760 hours. ?HbA1C: ?No results for input(s): HGBA1C in the last 72 hours. ? ?CBG: ?Recent Labs  ?Lab 08/22/21 ?0826  ?GLUCAP 109*  ? ?Lipid Profile: ?No results for input(s): CHOL, HDL, LDLCALC, TRIG, CHOLHDL, LDLDIRECT in the last  72 hours. ?Thyroid Function Tests: ?No results for input(s): TSH, T4TOTAL, FREET4, T3FREE, THYROIDAB in the last 72 hours. ?Anemia Panel: ?No results for input(s): VITAMINB12, FOLATE, FERRITIN, TIBC, IRON, RETICCTPCT in the last 72 hours. ?Urine analysis: ?   ?Component Value Date/Time  ? COLORURINE YELLOW 08/02/2021 2028  ? APPEARANCEUR HAZY (A) 08/02/2021 2028  ? LABSPEC 1.012 08/02/2021 2028  ? PHURINE 6.0 08/02/2021 2028  ? GLUCOSEU NEGATIVE 08/02/2021 2028  ? HGBUR SMALL (A) 08/02/2021 2028  ? BILIRUBINUR NEGATIVE 08/02/2021 2028  ? BILIRUBINUR negative 06/21/2021 1755  ? KETONESUR NEGATIVE 08/02/2021 2028  ? PROTEINUR 30 (A) 08/02/2021 2028  ? UROBILINOGEN 0.2 06/21/2021 1755  ? NITRITE NEGATIVE 08/02/2021 2028  ? LEUKOCYTESUR SMALL (A) 08/02/2021 2028  ? ?Sepsis Labs: ?@LABRCNTIP (procalcitonin:4,lacticidven:4) ? ?)No results found for this or any previous visit (from the past 240 hour(s)).  ? ?Radiology Studies: ?No results found. ? ? ?Scheduled Meds: ? baclofen  10 mg Oral Q6H  ? Chlorhexidine Gluconate Cloth  6 each Topical Daily  ? DULoxetine  40 mg Oral Daily  ? feeding supplement  1 Container Oral BID BM  ? feeding supplement  237 mL Oral Q24H  ? ferrous gluconate  324 mg Oral Q breakfast  ? Gerhardt's butt cream   Topical BID  ? ibuprofen  600 mg Oral Q6H  ? magnesium oxide  400 mg Oral BID  ? multivitamin with minerals  1 tablet Oral Daily  ? nicotine  21 mg Transdermal Daily  ? pantoprazole  40 mg Oral Daily  ? pregabalin  75 mg Oral TID  ? rivaroxaban  10 mg Oral Q supper  ? thiamine  100 mg Oral Daily  ? ?Continuous Infusions: ? lactated ringers Stopped (08/11/21 1336)  ? vancomycin 1,000 mg (08/25/21 08/27/21)  ? ? ? LOS: 23 days  ? ? ?Time spent: 0981 ? ? ? ? , MD ?[facility] ? ? ?08/25/2021, 11:31 AM  ?  ?

## 2021-08-25 NOTE — Progress Notes (Signed)
Occupational Therapy Treatment ?Patient Details ?Name: Latoya [name] ?MRN: [id number] ?DOB: January 03, 1986 ?Today's Date: 08/25/2021 ? ? ?History of present illness 36 y.o. female admitted 08/02/21 with back pain, hypotension, hyponatremia, leukocytosis and neutrophilia. Found to have bacteremia possible sepitc pulmonary emboli from endocarditis. PMH: IVDU, heroin abuse, ongoing. ?  ?OT comments ? Pt performing functional mobility to sink with Supervision and RW. Pt performing grooming tasks standing at sink with Supervision-Min Guard A for safety. Pt presenting with decreased activity tolerance due to pain. Pt requiring Mod A for returning to supine. Continue to recommend post-acute rehab, however, may progress beyond this. Will continue to follow acutely as admitted.  ? ?Recommendations for follow up therapy are one component of a multi-disciplinary discharge planning process, led by the attending physician.  Recommendations may be updated based on patient status, additional functional criteria and insurance authorization. ?   ?Follow Up Recommendations ? Acute inpatient rehab (3hours/day) (Pending progress - may progress beyond needing AIR)  ?  ?Assistance Recommended at Discharge Frequent or constant Supervision/Assistance  ?Patient can return home with the following ? A lot of help with bathing/dressing/bathroom;Two people to help with walking and/or transfers;Assistance with cooking/housework;Direct supervision/assist for medications management;Assist for transportation;Help with stairs or ramp for entrance;Direct supervision/assist for financial management ?  ?Equipment Recommendations ? Other (comment)  ?  ?Recommendations for Other Services   ? ?  ?Precautions / Restrictions Precautions ?Precautions: Fall;Back (for pain management) ?Precaution Booklet Issued: No ?Precaution Comments: Contact precs, monitor HR, pain management, reviewed spinal precautions for pain management ?Required Braces or Orthoses: Spinal  Brace (for pain management) ?Spinal Brace: Lumbar corset ?Restrictions ?Weight Bearing Restrictions: No  ? ? ?  ? ?Mobility Bed Mobility ?Overal bed mobility: Needs Assistance ?Bed Mobility: Sit to Supine ?  ?  ?  ?Sit to supine: Mod assist ?  ?General bed mobility comments: declining to log roll. Mod A for managing BLEs ?  ? ?Transfers ?Overall transfer level: Needs assistance ?Equipment used: Rolling walker (2 wheels) ?Transfers: Sit to/from Stand, Bed to chair/wheelchair/BSC ?Sit to Stand: Supervision ?  ?  ?  ?  ?  ?General transfer comment: Supervision for safety ?  ?  ?Balance Overall balance assessment: Needs assistance ?Sitting-balance support: Feet supported, No upper extremity supported ?Sitting balance-Leahy Scale: Good ?  ?  ?Standing balance support: Bilateral upper extremity supported, Reliant on assistive device for balance, During functional activity, No upper extremity supported ?Standing balance-Leahy Scale: Fair ?Standing balance comment: performing grooming at sink ?  ?  ?  ?  ?  ?  ?  ?  ?  ?  ?  ?   ? ?ADL either performed or assessed with clinical judgement  ? ?ADL Overall ADL's : Needs assistance/impaired ?  ?  ?Grooming: Supervision/safety;Min guard;Standing;Oral care;Wash/dry hands;Wash/dry face ?Grooming Details (indicate cue type and reason): Supervision-Min Guard A for safety while standing at sink for grooming. ?  ?  ?  ?  ?Upper Body Dressing : Supervision/safety;Sitting ?Upper Body Dressing Details (indicate cue type and reason): doffing brace ?  ?  ?Toilet Transfer: Supervision/safety;Ambulation;Rolling walker (2 wheels) ?Toilet Transfer Details (indicate cue type and reason): Supervision A for safety ?  ?  ?  ?  ?Functional mobility during ADLs: Supervision/safety;Rolling walker (2 wheels) ?General ADL Comments: Pt performing functional mobility to sink and then completing grooming ?  ? ?Extremity/Trunk Assessment Upper Extremity Assessment ?Upper Extremity Assessment: Overall WFL  for tasks assessed ?  ?Lower Extremity Assessment ?Lower Extremity Assessment: Defer to PT evaluation ?  ?  ?  ? ?  Vision   ?  ?  ?Perception   ?  ?Praxis   ?  ? ?Cognition Arousal/Alertness: Awake/alert ?Behavior During Therapy: [facility] for tasks assessed/performed, Anxious ?Overall Cognitive Status: Within Functional Limits for tasks assessed ?  ?  ?  ?  ?  ?  ?  ?  ?  ?  ?  ?  ?  ?  ?  ?  ?General Comments: Appropriately follows cues and sequences tasks with less cues. ?  ?  ?   ?Exercises   ? ?  ?Shoulder Instructions   ? ? ?  ?General Comments VSS  ? ? ?Pertinent Vitals/ Pain       Pain Assessment ?Pain Assessment: Faces ?Faces Pain Scale: Hurts a little bit ?Pain Location: low back and bil legs ?Pain Descriptors / Indicators: Grimacing, Guarding, Shooting ?Pain Intervention(s): Monitored during session, Limited activity within patient's tolerance, Repositioned ? ?Home Living   ?  ?  ?  ?  ?  ?  ?  ?  ?  ?  ?  ?  ?  ?  ?  ?  ?  ?  ? ?  ?Prior Functioning/Environment    ?  ?  ?  ?   ? ?Frequency ? Min 2X/week  ? ? ? ? ?  ?Progress Toward Goals ? ?OT Goals(current goals can now be found in the care plan section) ? Progress towards OT goals: Progressing toward goals ? ?Acute Rehab OT Goals ?OT Goal Formulation: With patient ?Time For Goal Achievement: 09/05/21 ?Potential to Achieve Goals: Good ?ADL Goals ?Pt Will Perform Grooming: with modified independence;standing ?Pt Will Perform Lower Body Dressing: with mod assist;sit to/from stand ?Pt Will Transfer to Toilet: with supervision;ambulating;regular height toilet;grab bars ?Pt Will Perform Toileting - Clothing Manipulation and hygiene: with modified independence;sit to/from stand  ?Plan Discharge plan remains appropriate   ? ?Co-evaluation ? ? ?   ?  ?  ?  ?  ? ?  ?AM-PAC OT "6 Clicks" Daily Activity     ?Outcome Measure ? ? Help from another person eating meals?: A Little ?Help from another person taking care of personal grooming?: A Little ?Help from another person  toileting, which includes using toliet, bedpan, or urinal?: A Lot ?Help from another person bathing (including washing, rinsing, drying)?: A Lot ?Help from another person to put on and taking off regular upper body clothing?: A Little ?Help from another person to put on and taking off regular lower body clothing?: A Lot ?6 Click Score: 15 ? ?  ?End of Session Equipment Utilized During Treatment: Rolling walker (2 wheels);Back brace ? ?OT Visit Diagnosis: Muscle weakness (generalized) (M62.81);Pain ?Pain - Right/Left: Right ?Pain - part of body: Leg ?  ?Activity Tolerance Patient tolerated treatment well ?  ?Patient Left in bed;with call bell/phone within reach;with bed alarm set ?  ?Nurse Communication Mobility status ?  ? ?   ? ?Time: 1610-9604 ?OT Time Calculation (min): 18 min ? ?Charges: OT General Charges ?$OT Visit: 1 Visit ?OT Treatments ?$Self Care/Home Management : 8-22 mins ? ?Latoya [name] MSOT, OTR/L ?Acute Rehab ?Pager: [phone] ?Office: [phone] ? ?Latoya [name] M Aalia Greulich ?08/25/2021, 5:18 PM ? ? ?

## 2021-08-26 DIAGNOSIS — I079 Rheumatic tricuspid valve disease, unspecified: Secondary | ICD-10-CM | POA: Diagnosis not present

## 2021-08-26 NOTE — Progress Notes (Signed)
Pharmacy Antibiotic Note ? ?Latoya [name] is a 36 y.o. female admitted on 08/02/2021 with MRSA bacteremia-  septic pulmonary emboli/endocarditis. Pharmacy has been consulted for Vancomycin dosing. Scr is at baseline of 0.6 with estimated CrCl > 100 ml/min.  ? ?On 4/17, vancomycin levels showed a peak of 30 and a trough of 14 on Vancomycin 1000mg  IV q12h.  Calculated AUC 600 which is at the upper end of goal range (450-600).  Current dose has been continued.  ? ? ?Plan: ?-  Continue vancomycin to 1000 mg every 12 hours - Predicted AUC 525 with Cmin 11.2 ?-Check VT and VP to dose adjust as needed on Monday ?- Continue vancomycin till 09/16/21 (6 wks from neg bcx) ? ?Height: 5\' 6"  (167.6 cm) ?Weight: 87.5 kg (192 lb 14.4 oz) ?IBW/kg (Calculated) : 59.3 ? ?Temp (24hrs), Avg:98.3 ?F (36.8 ?C), Min:97.9 ?F (36.6 ?C), Max:99.2 ?F (37.3 ?C) ? ?Recent Labs  ?Lab 08/20/21 ?2128 08/21/21 ?0530 08/22/21 ?0612 08/22/21 ?0945 08/25/21 ?0510  ?CREATININE  --   --  0.81 0.74 0.78  ?VANCOTROUGH  --  14*  --   --   --   ?VANCOPEAK 30  --   --   --   --   ? ?  ?Estimated Creatinine Clearance: 110.4 mL/min (by C-G formula based on SCr of 0.78 mg/dL).   ? ?Allergies  ?Allergen Reactions  ? Naltrexone Anxiety  ?  Severe anxiety, restlessness, vomiting  ? ? ?Antimicrobials this admission: ?Cefepime 3/29 >> 3/30 ?Flagyl 3/29 >> 3/30 ?Vancomycin 3/29 >> ? ?Dose adjustments this admission: ?4/3-4/4 VP 38 VT 14- AUC 697 >>Adj to 750 q 8 hours  ?4/10: VP 29, VT 15, AUC 591.8, Cmin 12.5 >> 1gm IV q12h for calculated AUC 525.5 ?4/17: VP 30, VT 14, AUC 600 ? ?Microbiology results: ?3/29 Influenza/COVID>> neg ?3/29 MRSA PCR >> positive ?3/29 BCx >> GPC in 4/4 bottles (BCID MRSA) ?3/31 BCX: MRSA - only 1 set drawn  ?4/2 BCx: NG ? ?4/29, PharmD ?PGY2 Cardiology Pharmacy Resident ?Phone: [phone] ?08/26/2021  11:34 AM ? ?Please check [email or web address] for unit-specific pharmacy phone numbers. ? ? ? ?

## 2021-08-26 NOTE — Progress Notes (Deleted)
Pharmacy Antibiotic Note ? ?Latoya [name] is a 36 y.o. female admitted on 08/02/2021 with MRSA bacteremia-  septic pulmonary emboli/endocarditis. Pharmacy has been consulted for Vancomycin dosing. Scr is at baseline of 0.6 with estimated CrCl > 100 ml/min.  ? ?On 4/17, vancomycin levels showed a peak of 30 and a trough of 14 on Vancomycin 1000mg  IV q12h.  Calculated AUC 600 which is at the upper end of goal range (450-600).  Current dose has been continued.  ? ? ?Plan: ?-  Continue vancomycin to 1000 mg every 12 hours - Predicted AUC 525 with Cmin 11.2 ?-Check VT and VP to dose adjust as needed ?- Continue vancomycin till 09/16/21 (6 wks from neg bcx) ? ?Height: 5\' 6"  (167.6 cm) ?Weight: 87.5 kg (192 lb 14.4 oz) ?IBW/kg (Calculated) : 59.3 ? ?Temp (24hrs), Avg:98.3 ?F (36.8 ?C), Min:97.9 ?F (36.6 ?C), Max:99.2 ?F (37.3 ?C) ? ?Recent Labs  ?Lab 08/20/21 ?2128 08/21/21 ?0530 08/22/21 ?0612 08/22/21 ?0945 08/25/21 ?0510  ?CREATININE  --   --  0.81 0.74 0.78  ?VANCOTROUGH  --  14*  --   --   --   ?VANCOPEAK 30  --   --   --   --   ? ?  ?Estimated Creatinine Clearance: 110.4 mL/min (by C-G formula based on SCr of 0.78 mg/dL).   ? ?Allergies  ?Allergen Reactions  ? Naltrexone Anxiety  ?  Severe anxiety, restlessness, vomiting  ? ? ?Antimicrobials this admission: ?Cefepime 3/29 >> 3/30 ?Flagyl 3/29 >> 3/30 ?Vancomycin 3/29 >> ? ?Dose adjustments this admission: ?4/3-4/4 VP 38 VT 14- AUC 697 >>Adj to 750 q 8 hours  ?4/10: VP 29, VT 15, AUC 591.8, Cmin 12.5 >> 1gm IV q12h for calculated AUC 525.5 ?4/17: VP 30, VT 14, AUC 600 ? ?Microbiology results: ?3/29 Influenza/COVID>> neg ?3/29 MRSA PCR >> positive ?3/29 BCx >> GPC in 4/4 bottles (BCID MRSA) ?3/31 BCX: MRSA - only 1 set drawn  ?4/2 BCx: NG ? ?4/29, PharmD ?PGY2 Cardiology Pharmacy Resident ?Phone: [phone] ?08/26/2021  11:31 AM ? ?Please check [email or web address] for unit-specific pharmacy phone numbers. ? ? ? ?

## 2021-08-26 NOTE — Progress Notes (Addendum)
?PROGRESS NOTE ? ? ? ?Latoya [name]  [id number] DOB: Feb 08, 1986 DOA: 08/02/2021 ?PCP: Pcp, No  ?35/F w/ history of IV heroin abuse and MSSA tricuspid valve endocarditis 2020 as well as MRSA discitis and osteomyelitis 2021 who presented to the [facility] ED 08/02/2021 with low back pain cough and fevers.  MRI of the lumbar spine was negative for infection but further work-up revealed MRSA bacteremia with tricuspid valve vegetation and septic pulmonary emboli. ?Ultimately the patient was transferred to [facility] for TCTS eval.  She was taken to the OR for/7/23 for a planned angio VAC procedure, but the discovery of a significant PFO required the abortion of this procedure. ?-Has remained stable on IV vancomycin per ID recommendations, to complete therapy 5/13 ? ? ?Subjective: ?Feels okay, tolerating diet, chronic back pain unchanged ? ?Assessment and Plan: ?  ?MRSA tricuspid valve endocarditis with severe TR  ?septic pulmonary emboli  ?severe sepsis, poa ?Sepsis has resolved ? -continue IV vancomycin through 09/16/2021 per ID recommendations  ?-angio VAC attempted 4/7 but had to be aborted due to PFO  ?-plan for repeat TTE 5-7 days prior to discontinuation of antibiotics ?  ?Persistent chronic lumbar pain  ?-history of lumbar osteomyelitis ?Present for 2 years and related to prior episode of osteomyelitis  ?-MRI this admission without evidence of discitis or osteomyelitis ?-Continue current regimen of oxycodone, Robaxin and Lyrica ?  ?IV drug abuse ?Makes it unsafe to pursue outpatient IV antibiotic therefore necessitating prolonged inpatient stay. Hiv/hcv neg ?-Counseled ? ?Debility ?PT/OT advising acute inpatient rehab, I spoke w/ them today and was told she screened out (due to IVDU and high level of functioning) ?- SNF? ? ?DVT prophylaxis: Xarelto ?Code Status: Full code ?Family Communication: Discussed with patient in detail, no family at bedside ?Disposition Plan: TBD ? ?Consultants:  ?Infectious disease, T  CTS ? ? ? ?Objective: ?Vitals:  ? 08/25/21 2048 08/26/21 0001 08/26/21 0348 08/26/21 0930  ?BP: 121/75 109/64 112/87 109/79  ?Pulse: 93 95 93 91  ?Resp: 20 20 20 20   ?Temp: 98.1 ?F (36.7 ?C) 99.2 ?F (37.3 ?C) 98.3 ?F (36.8 ?C) 98.2 ?F (36.8 ?C)  ?TempSrc: Oral Oral Oral Oral  ?SpO2: 100% 98% 100% 95%  ?Weight:      ?Height:      ? ? ?Intake/Output Summary (Last 24 hours) at 08/26/2021 1219 ?Last data filed at 08/25/2021 2000 ?Gross per 24 hour  ?Intake 818.13 ml  ?Output --  ?Net 818.13 ml  ? ?Filed Weights  ? 08/03/21 1036 08/08/21 1111 08/09/21 1607  ?Weight: 82.2 kg 82.2 kg 87.5 kg  ? ? ?Examination: ? ?General exam: Pleasant young female laying in bed, awake alert, oriented x3, flat affect, no distress ?CVS: S1-S2, regular rhythm, mild systolic murmur ?Lungs: Clear bilaterally ?Abdomen: Soft, nontender, bowel sounds present ?Extremities: no  edema  ?Psychiatry: Flat affect ? ? ? ?Data Reviewed:  ? ?CBC: ?No results for input(s): WBC, NEUTROABS, HGB, HCT, MCV, PLT in the last 168 hours. ?Basic Metabolic Panel: ?Recent Labs  ?Lab 08/22/21 ?0612 08/22/21 ?0945 08/25/21 ?0510  ?NA 127* 141 141  ?K 3.1* 4.0 4.1  ?CL 99 108 108  ?CO2 22 27 27   ?GLUCOSE 555* 115* 106*  ?BUN 12 12 22*  ?CREATININE 0.81 0.74 0.78  ?CALCIUM 7.2* 8.6* 8.5*  ? ?GFR: ?Estimated Creatinine Clearance: 110.4 mL/min (by C-G formula based on SCr of 0.78 mg/dL). ?Liver Function Tests: ?No results for input(s): AST, ALT, ALKPHOS, BILITOT, PROT, ALBUMIN in the last 168 hours. ?No  results for input(s): LIPASE, AMYLASE in the last 168 hours. ?No results for input(s): AMMONIA in the last 168 hours. ?Coagulation Profile: ?No results for input(s): INR, PROTIME in the last 168 hours. ?Cardiac Enzymes: ?No results for input(s): CKTOTAL, CKMB, CKMBINDEX, TROPONINI in the last 168 hours. ?BNP (last 3 results) ?No results for input(s): PROBNP in the last 8760 hours. ?HbA1C: ?No results for input(s): HGBA1C in the last 72 hours. ? ?CBG: ?Recent Labs  ?Lab  08/22/21 ?0826  ?GLUCAP 109*  ? ?Lipid Profile: ?No results for input(s): CHOL, HDL, LDLCALC, TRIG, CHOLHDL, LDLDIRECT in the last 72 hours. ?Thyroid Function Tests: ?No results for input(s): TSH, T4TOTAL, FREET4, T3FREE, THYROIDAB in the last 72 hours. ?Anemia Panel: ?No results for input(s): VITAMINB12, FOLATE, FERRITIN, TIBC, IRON, RETICCTPCT in the last 72 hours. ?Urine analysis: ?   ?Component Value Date/Time  ? COLORURINE YELLOW 08/02/2021 2028  ? APPEARANCEUR HAZY (A) 08/02/2021 2028  ? LABSPEC 1.012 08/02/2021 2028  ? PHURINE 6.0 08/02/2021 2028  ? GLUCOSEU NEGATIVE 08/02/2021 2028  ? HGBUR SMALL (A) 08/02/2021 2028  ? BILIRUBINUR NEGATIVE 08/02/2021 2028  ? BILIRUBINUR negative 06/21/2021 1755  ? KETONESUR NEGATIVE 08/02/2021 2028  ? PROTEINUR 30 (A) 08/02/2021 2028  ? UROBILINOGEN 0.2 06/21/2021 1755  ? NITRITE NEGATIVE 08/02/2021 2028  ? LEUKOCYTESUR SMALL (A) 08/02/2021 2028  ? ?Sepsis Labs: ?@LABRCNTIP (procalcitonin:4,lacticidven:4) ? ?)No results found for this or any previous visit (from the past 240 hour(s)).  ? ?Radiology Studies: ?No results found. ? ? ?Scheduled Meds: ? baclofen  10 mg Oral Q6H  ? Chlorhexidine Gluconate Cloth  6 each Topical Daily  ? DULoxetine  40 mg Oral Daily  ? feeding supplement  1 Container Oral BID BM  ? feeding supplement  237 mL Oral Q24H  ? ferrous gluconate  324 mg Oral Q breakfast  ? Gerhardt's butt cream   Topical BID  ? magnesium oxide  400 mg Oral BID  ? multivitamin with minerals  1 tablet Oral Daily  ? nicotine  21 mg Transdermal Daily  ? pantoprazole  40 mg Oral Daily  ? pregabalin  75 mg Oral TID  ? rivaroxaban  10 mg Oral Q supper  ? thiamine  100 mg Oral Daily  ? ?Continuous Infusions: ? lactated ringers Stopped (08/11/21 1336)  ? vancomycin 1,000 mg (08/26/21 0537)  ? ? ? LOS: 24 days  ? ? ?Time spent: 08/28/21 ? ? ? ? , MD ?[facility] ? ? ?08/26/2021, 12:19 PM  ?  ?

## 2021-08-27 DIAGNOSIS — B9562 Methicillin resistant Staphylococcus aureus infection as the cause of diseases classified elsewhere: Secondary | ICD-10-CM | POA: Diagnosis not present

## 2021-08-27 DIAGNOSIS — I2690 Septic pulmonary embolism without acute cor pulmonale: Secondary | ICD-10-CM

## 2021-08-27 DIAGNOSIS — R7881 Bacteremia: Secondary | ICD-10-CM | POA: Diagnosis not present

## 2021-08-27 HISTORY — DX: Septic pulmonary embolism without acute cor pulmonale: I26.90

## 2021-08-27 HISTORY — DX: Bacteremia: R78.81

## 2021-08-27 NOTE — Progress Notes (Signed)
?PROGRESS NOTE ? ? ? ?Latoya DecampHeather [name]  ZOX:[id number] DOB: 1985-06-06 DOA: 08/02/2021 ?PCP: Pcp, No  ? ?  ?Brief Narrative:  ?35/F w/ history of IV heroin abuse and MSSA tricuspid valve endocarditis 2020 as well as MRSA discitis and osteomyelitis 2021 who [facility] ED 08/02/2021 with low back pain cough and fevers.  MRI of the lumbar spine was negative for infection but further work-up revealed MRSA bacteremia with tricuspid valve vegetation and septic pulmonary emboli. Ultimately the patient was [facility].  She was taken to the OR 08/11/21 for a planned angio VAC procedure, but the discovery of a significant PFO required the abortion of this procedure. She has remained stable on IV vancomycin per ID recommendations, to complete therapy 5/13 Handy repeat TTE about 5 to 7 days prior to discontinuation of antibiotics. ? ?New events last 24 hours / Subjective: ?Patient without any new physical complaints.  Wants to take a shower. ? ?Assessment & Plan: ?  ?Principal Problem: ?  MRSA bacteremia ?Active Problems: ?  Endocarditis of tricuspid valve ?  History of bacterial endocarditis ?  Iron deficiency anemia ?  IV drug abuse (HCC) ?  Opioid use disorder ?  Septic pulmonary embolism (HCC) ? ? ?MRSA bacteremia, tricuspid valve endocarditis, septic pulmonary emboli ?-Severe sepsis present on admission, sepsis has resolved ?-Continue vancomycin through 5/13.  Plan to repeat TTE 5 to 7 days prior to discontinuation of antibiotics ? ?Chronic lumbar pain, history of lumbar osteomyelitis ?-MRI this admission without evidence of discitis or osteomyelitis ?-Continue oxycodone, Lyrica, baclofen, Cymbalta, Dilaudid, ibuprofen ? ?History of IV drug abuse ?-Counseled on cessation ? ?Debility ?-PT OT recommending acute inpatient rehab, although may not qualify due to history of drug abuse ?-Continue PT OT ?-TOC to follow ? ? ?DVT prophylaxis: on Xarelto for ppx as she refused lovenox   ?rivaroxaban (XARELTO) tablet 10 mg Start: 08/13/21 1700 ?Place and maintain sequential compression device Start: 08/07/21 1350 ?rivaroxaban (XARELTO) tablet 10 mg  ?Code Status: Full ?Family Communication: None at bedside ?Disposition Plan:  ?Status is: Inpatient ?Remains inpatient appropriate because: Remains on IV antibiotics ? ? ?Antimicrobials:  ?Anti-infectives (From admission, onward)  ? ? Start     Dose/Rate Route Frequency Ordered Stop  ? 08/21/21 1800  vancomycin (VANCOREADY) IVPB 750 mg/150 mL  Status:  Discontinued       ? 750 mg ?150 mL/hr over 60 Minutes Intravenous Every 12 hours 08/21/21 1344 08/21/21 1347  ? 08/21/21 1800  vancomycin (VANCOCIN) IVPB 1000 mg/200 mL premix       ? 1,000 mg ?200 mL/hr over 60 Minutes Intravenous Every 12 hours 08/21/21 1347 09/16/21 2359  ? 08/15/21 0600  vancomycin (VANCOCIN) IVPB 1000 mg/200 mL premix  Status:  Discontinued       ? 1,000 mg ?200 mL/hr over 60 Minutes Intravenous Every 12 hours 08/14/21 2006 08/21/21 1344  ? 08/09/21 1000  vancomycin (VANCOREADY) IVPB 750 mg/150 mL  Status:  Discontinued       ? 750 mg ?150 mL/hr over 60 Minutes Intravenous Every 8 hours 08/09/21 0237 08/14/21 2006  ? 08/08/21 1700  vancomycin (VANCOREADY) IVPB 1500 mg/300 mL  Status:  Discontinued       ? 750 mg ?75 mL/hr over 120 Minutes Intravenous Every 8 hours 08/08/21 0920 08/09/21 0237  ? 08/05/21 1800  vancomycin (VANCOREADY) IVPB 1500 mg/300 mL  Status:  Discontinued       ? 1,500 mg ?150 mL/hr over 120  Minutes Intravenous Every 12 hours 08/05/21 0618 08/08/21 0920  ? 08/04/21 1700  vancomycin (VANCOREADY) IVPB 1250 mg/250 mL  Status:  Discontinued       ? 1,250 mg ?166.7 mL/hr over 90 Minutes Intravenous Every 12 hours 08/04/21 1613 08/05/21 0618  ? 08/03/21 1700  vancomycin (VANCOCIN) IVPB 1000 mg/200 mL premix  Status:  Discontinued       ? 1,000 mg ?200 mL/hr over 60 Minutes Intravenous Every 12 hours 08/03/21 1039 08/03/21 1042  ? 08/03/21 1700  Vancomycin (VANCOCIN)  1,250 mg in sodium chloride 0.9 % 250 mL IVPB  Status:  Discontinued       ? 1,250 mg ?166.7 mL/hr over 90 Minutes Intravenous Every 12 hours 08/03/21 1042 08/03/21 1111  ? 08/03/21 1700  Vancomycin (VANCOCIN) 1,250 mg in sodium chloride 0.9 % 250 mL IVPB  Status:  Discontinued       ? 1,250 mg ?166.7 mL/hr over 90 Minutes Intravenous Every 12 hours 08/03/21 1112 08/03/21 1124  ? 08/03/21 1700  Vancomycin (VANCOCIN) 1,250 mg in sodium chloride 0.9 % 250 mL IVPB  Status:  Discontinued       ? 1,250 mg ?175 mL/hr over 90 Minutes Intravenous Every 12 hours 08/03/21 1124 08/04/21 1614  ? 08/03/21 0600  vancomycin (VANCOCIN) IVPB 750 mg/150 ml premix  Status:  Discontinued       ? 750 mg ?150 mL/hr over 60 Minutes Intravenous Every 12 hours 08/02/21 2143 08/03/21 0020  ? 08/03/21 0600  vancomycin (VANCOREADY) IVPB 750 mg/150 mL  Status:  Discontinued       ? 750 mg ?150 mL/hr over 60 Minutes Intravenous Every 12 hours 08/03/21 0022 08/03/21 1039  ? 08/03/21 0200  ceFEPIme (MAXIPIME) 2 g in sodium chloride 0.9 % 100 mL IVPB  Status:  Discontinued       ? 2 g ?200 mL/hr over 30 Minutes Intravenous Every 8 hours 08/02/21 2138 08/03/21 0946  ? 08/02/21 2200  metroNIDAZOLE (FLAGYL) IVPB 500 mg  Status:  Discontinued       ? 500 mg ?100 mL/hr over 60 Minutes Intravenous Every 12 hours 08/02/21 2135 08/03/21 0946  ? 08/02/21 1545  vancomycin (VANCOCIN) IVPB 1000 mg/200 mL premix       ? 1,000 mg ?200 mL/hr over 60 Minutes Intravenous  Once 08/02/21 1537 08/02/21 2138  ? 08/02/21 1545  ceFEPIme (MAXIPIME) 2 g in sodium chloride 0.9 % 100 mL IVPB       ? 2 g ?200 mL/hr over 30 Minutes Intravenous  Once 08/02/21 1537 08/02/21 1933  ? ?  ? ? ? ?Objective: ?Vitals:  ? 08/27/21 0426 08/27/21 7628 08/27/21 0911 08/27/21 0931  ?BP: 113/79 108/82  108/76  ?Pulse: 89 86  86  ?Resp: 18 18 19 19   ?Temp: 98.1 ?F (36.7 ?C) 97.9 ?F (36.6 ?C)  98 ?F (36.7 ?C)  ?TempSrc: Oral Oral  Oral  ?SpO2: 96% 99%  96%  ?Weight:      ?Height:       ? ? ?Intake/Output Summary (Last 24 hours) at 08/27/2021 1037 ?Last data filed at 08/27/2021 0808 ?Gross per 24 hour  ?Intake 1000 ml  ?Output 1550 ml  ?Net -550 ml  ? ?Filed Weights  ? 08/03/21 1036 08/08/21 1111 08/09/21 1607  ?Weight: 82.2 kg 82.2 kg 87.5 kg  ? ? ?Examination:  ?General exam: Appears calm and comfortable  ?Respiratory system: Clear to auscultation. Respiratory effort normal. No respiratory distress. No conversational dyspnea.  ?Cardiovascular system: S1 &  S2 heard, RRR. No murmurs. No pedal edema. ?Gastrointestinal system: Abdomen is nondistended, soft and nontender. Normal bowel sounds heard. ?Central nervous system: Alert and oriented ?Extremities: Symmetric in appearance  ?Psychiatry: Mood & affect appropriate.  ? ?Data Reviewed: I have personally reviewed following labs and imaging studies ? ?CBC: ?No results for input(s): WBC, NEUTROABS, HGB, HCT, MCV, PLT in the last 168 hours. ?Basic Metabolic Panel: ?Recent Labs  ?Lab 08/22/21 ?0612 08/22/21 ?0945 08/25/21 ?0510  ?NA 127* 141 141  ?K 3.1* 4.0 4.1  ?CL 99 108 108  ?CO2 22 27 27   ?GLUCOSE 555* 115* 106*  ?BUN 12 12 22*  ?CREATININE 0.81 0.74 0.78  ?CALCIUM 7.2* 8.6* 8.5*  ? ?GFR: ?Estimated Creatinine Clearance: 110.4 mL/min (by C-G formula based on SCr of 0.78 mg/dL). ?Liver Function Tests: ?No results for input(s): AST, ALT, ALKPHOS, BILITOT, PROT, ALBUMIN in the last 168 hours. ?No results for input(s): LIPASE, AMYLASE in the last 168 hours. ?No results for input(s): AMMONIA in the last 168 hours. ?Coagulation Profile: ?No results for input(s): INR, PROTIME in the last 168 hours. ?Cardiac Enzymes: ?No results for input(s): CKTOTAL, CKMB, CKMBINDEX, TROPONINI in the last 168 hours. ?BNP (last 3 results) ?No results for input(s): PROBNP in the last 8760 hours. ?HbA1C: ?No results for input(s): HGBA1C in the last 72 hours. ?CBG: ?Recent Labs  ?Lab 08/22/21 ?0826  ?GLUCAP 109*  ? ?Lipid Profile: ?No results for input(s): CHOL, HDL, LDLCALC,  TRIG, CHOLHDL, LDLDIRECT in the last 72 hours. ?Thyroid Function Tests: ?No results for input(s): TSH, T4TOTAL, FREET4, T3FREE, THYROIDAB in the last 72 hours. ?Anemia Panel: ?No results for input(s): VITAMINB12, FOLATE,

## 2021-08-28 DIAGNOSIS — B9562 Methicillin resistant Staphylococcus aureus infection as the cause of diseases classified elsewhere: Secondary | ICD-10-CM | POA: Diagnosis not present

## 2021-08-28 DIAGNOSIS — R7881 Bacteremia: Secondary | ICD-10-CM | POA: Diagnosis not present

## 2021-08-28 LAB — BASIC METABOLIC PANEL
Anion gap: 5 (ref 5–15)
BUN: 14 mg/dL (ref 6–20)
CO2: 27 mmol/L (ref 22–32)
Calcium: 8.6 mg/dL — ABNORMAL LOW (ref 8.9–10.3)
Chloride: 105 mmol/L (ref 98–111)
Creatinine, Ser: 0.7 mg/dL (ref 0.44–1.00)
GFR, Estimated: >60 mL/min (ref >60)
Glucose, Bld: 120 mg/dL — ABNORMAL HIGH (ref 70–99)
Potassium: 3.7 mmol/L (ref 3.5–5.1)
Sodium: 137 mmol/L (ref 135–145)

## 2021-08-28 LAB — CBC
HCT: 29.4 % — ABNORMAL LOW (ref 36.0–46.0)
Hemoglobin: 9 g/dL — ABNORMAL LOW (ref 12.0–15.0)
MCH: 24.3 pg — ABNORMAL LOW (ref 26.0–34.0)
MCHC: 30.6 g/dL (ref 30.0–36.0)
MCV: 79.5 fL — ABNORMAL LOW (ref 80.0–100.0)
Platelets: 228 10*3/uL (ref 150–400)
RBC: 3.7 MIL/uL — ABNORMAL LOW (ref 3.87–5.11)
RDW: 22 % — ABNORMAL HIGH (ref 11.5–15.5)
WBC: 9.4 10*3/uL (ref 4.0–10.5)
nRBC: 0 % (ref 0.0–0.2)

## 2021-08-28 LAB — VANCOMYCIN, PEAK: Vancomycin Pk: 38 ug/mL (ref 30–40)

## 2021-08-28 MED ORDER — ALTEPLASE 2 MG IJ SOLR
2.0000 mg | Freq: Once | INTRAMUSCULAR | Status: AC
  Administered 2021-08-28: 2 mg
  Filled 2021-08-28: qty 2

## 2021-08-28 NOTE — Progress Notes (Signed)
?PROGRESS NOTE ? ? ? ?Latoya [name]  XTG:[id number] DOB: April 16, 1986 DOA: 08/02/2021 ?PCP: Pcp, No  ? ?  ?Brief Narrative:  ?35/F w/ history of IV heroin abuse and MSSA tricuspid valve endocarditis 2020 as well as MRSA discitis and osteomyelitis 2021 who presented to the [facility] ED 08/02/2021 with low back pain cough and fevers.  MRI of the lumbar spine was negative for infection but further work-up revealed MRSA bacteremia with tricuspid valve vegetation and septic pulmonary emboli. Ultimately the patient was transferred to [facility] for TCTS eval.  She was taken to the OR 08/11/21 for a planned angio VAC procedure, but the discovery of a significant PFO required the abortion of this procedure. She has remained stable on IV vancomycin per ID recommendations, to complete therapy 5/13 Handy repeat TTE about 5 to 7 days prior to discontinuation of antibiotics. ? ?New events last 24 hours / Subjective: ?No acute events overnight.  No complaints on my examination. ? ?Assessment & Plan: ?  ?Principal Problem: ?  MRSA bacteremia ?Active Problems: ?  Endocarditis of tricuspid valve ?  History of bacterial endocarditis ?  Iron deficiency anemia ?  IV drug abuse (HCC) ?  Opioid use disorder ?  Septic pulmonary embolism (HCC) ? ? ?MRSA bacteremia, tricuspid valve endocarditis, septic pulmonary emboli ?-Severe sepsis present on admission, sepsis has resolved ?-Continue vancomycin through 5/13.  Plan to repeat TTE 5 to 7 days prior to discontinuation of antibiotics ? ?Chronic lumbar pain, history of lumbar osteomyelitis ?-MRI this admission without evidence of discitis or osteomyelitis ?-Continue oxycodone, Lyrica, baclofen, Cymbalta, Dilaudid, ibuprofen ? ?History of IV drug abuse ?-Counseled on cessation ? ?Debility ?-PT OT recommending acute inpatient rehab, although may not qualify due to history of drug abuse ?-Continue PT OT ?-TOC to follow ? ? ?DVT prophylaxis: on Xarelto for ppx as she refused lovenox  ?rivaroxaban  (XARELTO) tablet 10 mg Start: 08/13/21 1700 ?Place and maintain sequential compression device Start: 08/07/21 1350 ?rivaroxaban (XARELTO) tablet 10 mg  ?Code Status: Full ?Family Communication: None at bedside ?Disposition Plan:  ?Status is: Inpatient ?Remains inpatient appropriate because: Remains on IV antibiotics ? ? ?Antimicrobials:  ?Anti-infectives (From admission, onward)  ? ? Start     Dose/Rate Route Frequency Ordered Stop  ? 08/21/21 1800  vancomycin (VANCOREADY) IVPB 750 mg/150 mL  Status:  Discontinued       ? 750 mg ?150 mL/hr over 60 Minutes Intravenous Every 12 hours 08/21/21 1344 08/21/21 1347  ? 08/21/21 1800  vancomycin (VANCOCIN) IVPB 1000 mg/200 mL premix       ? 1,000 mg ?200 mL/hr over 60 Minutes Intravenous Every 12 hours 08/21/21 1347 09/16/21 2359  ? 08/15/21 0600  vancomycin (VANCOCIN) IVPB 1000 mg/200 mL premix  Status:  Discontinued       ? 1,000 mg ?200 mL/hr over 60 Minutes Intravenous Every 12 hours 08/14/21 2006 08/21/21 1344  ? 08/09/21 1000  vancomycin (VANCOREADY) IVPB 750 mg/150 mL  Status:  Discontinued       ? 750 mg ?150 mL/hr over 60 Minutes Intravenous Every 8 hours 08/09/21 0237 08/14/21 2006  ? 08/08/21 1700  vancomycin (VANCOREADY) IVPB 1500 mg/300 mL  Status:  Discontinued       ? 750 mg ?75 mL/hr over 120 Minutes Intravenous Every 8 hours 08/08/21 0920 08/09/21 0237  ? 08/05/21 1800  vancomycin (VANCOREADY) IVPB 1500 mg/300 mL  Status:  Discontinued       ? 1,500 mg ?150 mL/hr over 120 Minutes Intravenous  Every 12 hours 08/05/21 0618 08/08/21 0920  ? 08/04/21 1700  vancomycin (VANCOREADY) IVPB 1250 mg/250 mL  Status:  Discontinued       ? 1,250 mg ?166.7 mL/hr over 90 Minutes Intravenous Every 12 hours 08/04/21 1613 08/05/21 0618  ? 08/03/21 1700  vancomycin (VANCOCIN) IVPB 1000 mg/200 mL premix  Status:  Discontinued       ? 1,000 mg ?200 mL/hr over 60 Minutes Intravenous Every 12 hours 08/03/21 1039 08/03/21 1042  ? 08/03/21 1700  Vancomycin (VANCOCIN) 1,250 mg in  sodium chloride 0.9 % 250 mL IVPB  Status:  Discontinued       ? 1,250 mg ?166.7 mL/hr over 90 Minutes Intravenous Every 12 hours 08/03/21 1042 08/03/21 1111  ? 08/03/21 1700  Vancomycin (VANCOCIN) 1,250 mg in sodium chloride 0.9 % 250 mL IVPB  Status:  Discontinued       ? 1,250 mg ?166.7 mL/hr over 90 Minutes Intravenous Every 12 hours 08/03/21 1112 08/03/21 1124  ? 08/03/21 1700  Vancomycin (VANCOCIN) 1,250 mg in sodium chloride 0.9 % 250 mL IVPB  Status:  Discontinued       ? 1,250 mg ?175 mL/hr over 90 Minutes Intravenous Every 12 hours 08/03/21 1124 08/04/21 1614  ? 08/03/21 0600  vancomycin (VANCOCIN) IVPB 750 mg/150 ml premix  Status:  Discontinued       ? 750 mg ?150 mL/hr over 60 Minutes Intravenous Every 12 hours 08/02/21 2143 08/03/21 0020  ? 08/03/21 0600  vancomycin (VANCOREADY) IVPB 750 mg/150 mL  Status:  Discontinued       ? 750 mg ?150 mL/hr over 60 Minutes Intravenous Every 12 hours 08/03/21 0022 08/03/21 1039  ? 08/03/21 0200  ceFEPIme (MAXIPIME) 2 g in sodium chloride 0.9 % 100 mL IVPB  Status:  Discontinued       ? 2 g ?200 mL/hr over 30 Minutes Intravenous Every 8 hours 08/02/21 2138 08/03/21 0946  ? 08/02/21 2200  metroNIDAZOLE (FLAGYL) IVPB 500 mg  Status:  Discontinued       ? 500 mg ?100 mL/hr over 60 Minutes Intravenous Every 12 hours 08/02/21 2135 08/03/21 0946  ? 08/02/21 1545  vancomycin (VANCOCIN) IVPB 1000 mg/200 mL premix       ? 1,000 mg ?200 mL/hr over 60 Minutes Intravenous  Once 08/02/21 1537 08/02/21 2138  ? 08/02/21 1545  ceFEPIme (MAXIPIME) 2 g in sodium chloride 0.9 % 100 mL IVPB       ? 2 g ?200 mL/hr over 30 Minutes Intravenous  Once 08/02/21 1537 08/02/21 1933  ? ?  ? ? ? ?Objective: ?Vitals:  ? 08/27/21 1618 08/27/21 2022 08/27/21 2353 08/28/21 0755  ?BP: 115/76 120/84 121/79 (!) 115/96  ?Pulse: 96 96 100 83  ?Resp: 19 18 20 20   ?Temp: 98.2 ?F (36.8 ?C) 98.2 ?F (36.8 ?C) 98.3 ?F (36.8 ?C) 98.4 ?F (36.9 ?C)  ?TempSrc: Oral Oral Oral Oral  ?SpO2: 98% 95% 96% 97%  ?Weight:       ?Height:      ? ? ?Intake/Output Summary (Last 24 hours) at 08/28/2021 0959 ?Last data filed at 08/28/2021 0435 ?Gross per 24 hour  ?Intake 760 ml  ?Output 1300 ml  ?Net -540 ml  ? ? ?Filed Weights  ? 08/03/21 1036 08/08/21 1111 08/09/21 1607  ?Weight: 82.2 kg 82.2 kg 87.5 kg  ? ? ?Examination:  ?General exam: Appears calm and comfortable  ?Psychiatry: Mood & affect appropriate.  ? ?Data Reviewed: I have personally reviewed following labs and  imaging studies ? ?CBC: ?Recent Labs  ?Lab 08/28/21 ?[id number]  ?WBC 9.4  ?HGB 9.0*  ?HCT 29.4*  ?MCV 79.5*  ?PLT 228  ? ?Basic Metabolic Panel: ?Recent Labs  ?Lab 08/22/21 ?0612 08/22/21 ?0945 08/25/21 ?0510 08/28/21 ?[id number]  ?NA 127* 141 141 137  ?K 3.1* 4.0 4.1 3.7  ?CL 99 108 108 105  ?CO2 22 27 27 27   ?GLUCOSE 555* 115* 106* 120*  ?BUN 12 12 22* 14  ?CREATININE 0.81 0.74 0.78 0.70  ?CALCIUM 7.2* 8.6* 8.5* 8.6*  ? ? ?GFR: ?Estimated Creatinine Clearance: 110.4 mL/min (by C-G formula based on SCr of 0.7 mg/dL). ?Liver Function Tests: ?No results for input(s): AST, ALT, ALKPHOS, BILITOT, PROT, ALBUMIN in the last 168 hours. ?No results for input(s): LIPASE, AMYLASE in the last 168 hours. ?No results for input(s): AMMONIA in the last 168 hours. ?Coagulation Profile: ?No results for input(s): INR, PROTIME in the last 168 hours. ?Cardiac Enzymes: ?No results for input(s): CKTOTAL, CKMB, CKMBINDEX, TROPONINI in the last 168 hours. ?BNP (last 3 results) ?No results for input(s): PROBNP in the last 8760 hours. ?HbA1C: ?No results for input(s): HGBA1C in the last 72 hours. ?CBG: ?Recent Labs  ?Lab 08/22/21 ?0826  ?GLUCAP 109*  ? ? ?Lipid Profile: ?No results for input(s): CHOL, HDL, LDLCALC, TRIG, CHOLHDL, LDLDIRECT in the last 72 hours. ?Thyroid Function Tests: ?No results for input(s): TSH, T4TOTAL, FREET4, T3FREE, THYROIDAB in the last 72 hours. ?Anemia Panel: ?No results for input(s): VITAMINB12, FOLATE, FERRITIN, TIBC, IRON, RETICCTPCT in the last 72 hours. ?Sepsis Labs: ?No results  for input(s): PROCALCITON, LATICACIDVEN in the last 168 hours. ? ?No results found for this or any previous visit (from the past 240 hour(s)).  ? ? ?Radiology Studies: ?No results found. ? ? ? ?Scheduled M

## 2021-08-28 NOTE — Progress Notes (Signed)
PT Cancellation Note ? ?Patient Details ?Name: Latoya [name] ?MRN: [id number] ?DOB: Mar 01, 1986 ? ? ?Cancelled Treatment:    Reason Eval/Treat Not Completed: Patient declined, no reason specified Pt refusing therapy x 2 due to fatigue. ? ?[name], PT, DPT ?Acute Rehabilitation Services ?Pager [phone] ?Office [phone] ? ? ? ?[name] ?08/28/2021, 4:34 PM ?

## 2021-08-29 ENCOUNTER — Other Ambulatory Visit (HOSPITAL_COMMUNITY): Payer: Self-pay

## 2021-08-29 DIAGNOSIS — R7881 Bacteremia: Secondary | ICD-10-CM | POA: Diagnosis not present

## 2021-08-29 DIAGNOSIS — B9562 Methicillin resistant Staphylococcus aureus infection as the cause of diseases classified elsewhere: Secondary | ICD-10-CM | POA: Diagnosis not present

## 2021-08-29 LAB — VANCOMYCIN, TROUGH: Vancomycin Tr: 14 ug/mL — ABNORMAL LOW (ref 15–20)

## 2021-08-29 MED ORDER — VANCOMYCIN HCL 1750 MG/350ML IV SOLN
1750.0000 mg | INTRAVENOUS | Status: AC
  Administered 2021-08-29 – 2021-09-16 (×19): 1750 mg via INTRAVENOUS
  Filled 2021-08-29 (×19): qty 350

## 2021-08-29 MED ORDER — OXYCODONE HCL 5 MG PO TABS
7.5000 mg | ORAL_TABLET | ORAL | Status: DC
  Administered 2021-08-29 – 2021-08-30 (×5): 7.5 mg via ORAL
  Filled 2021-08-29 (×5): qty 2

## 2021-08-29 MED ORDER — OXYCODONE HCL 5 MG PO TABS
5.0000 mg | ORAL_TABLET | ORAL | Status: DC | PRN
  Administered 2021-08-29 – 2021-08-30 (×2): 5 mg via ORAL
  Filled 2021-08-29 (×2): qty 1

## 2021-08-29 MED ORDER — OXYCODONE HCL 5 MG PO TABS: 7.5000 mg | ORAL_TABLET | ORAL | Status: DC

## 2021-08-29 MED ORDER — HYDROMORPHONE HCL 1 MG/ML IJ SOLN: 0.5000 mg | INTRAMUSCULAR | Status: DC | PRN

## 2021-08-29 NOTE — Progress Notes (Addendum)
Occupational Therapy Treatment ?Patient Details ?Name: Latoya [name] ?MRN: [id number] ?DOB: 1986/02/27 ?Today's Date: 08/29/2021 ? ? ?History of present illness 36 y.o. female admitted 08/02/21 with back pain, hypotension, hyponatremia, leukocytosis and neutrophilia. Found to have bacteremia possible sepitc pulmonary emboli from endocarditis. PMH: IVDU, heroin abuse, ongoing. ?  ?OT comments ? Pt very motivated to participate in taking a shower; MD placed order to clear for showers. Pt performing functional mobility to bathroom with Supervision and RW. Performing shower transfer with close Min guard A. Pt completing UB bathing while seated on BSC with Mod I (increased time). Pt shaving her legs with modified stool to elevate her leg and Min Guard A. Max A to don socks. Use of music for anxiety and pain relief. Update dc to home with follow up at OP. Will continue to follow acutely as admitted.   ? ?Recommendations for follow up therapy are one component of a multi-disciplinary discharge planning process, led by the attending physician.  Recommendations may be updated based on patient status, additional functional criteria and insurance authorization. ?   ?Follow Up Recommendations ? Outpatient OT  ?  ?Assistance Recommended at Discharge Frequent or constant Supervision/Assistance  ?Patient can return home with the following ? A lot of help with bathing/dressing/bathroom;Two people to help with walking and/or transfers;Assistance with cooking/housework;Direct supervision/assist for medications management;Assist for transportation;Help with stairs or ramp for entrance;Direct supervision/assist for financial management ?  ?Equipment Recommendations ? BSC/3in1  ?  ?Recommendations for Other Services   ? ?  ?Precautions / Restrictions Precautions ?Precautions: Fall;Back ?Precaution Comments: Contact precs, monitor HR, pain management, reviewed spinal precautions for pain management ?Required Braces or Orthoses: Spinal  Brace (for pain management) ?Spinal Brace: Lumbar corset  ? ? ?  ? ?Mobility Bed Mobility ?Overal bed mobility: Needs Assistance ?Bed Mobility: Supine to Sit ?  ?  ?Supine to sit: Supervision, HOB elevated ?  ?  ?General bed mobility comments: Supervision for safety and increased time for pain ?  ? ?Transfers ?Overall transfer level: Needs assistance ?Equipment used: Rolling walker (2 wheels) ?Transfers: Sit to/from Stand ?Sit to Stand: Supervision ?  ?  ?  ?  ?  ?General transfer comment: Supervision for safety ?  ?  ?Balance Overall balance assessment: Needs assistance ?Sitting-balance support: No upper extremity supported, Feet supported ?Sitting balance-Leahy Scale: Good ?  ?  ?Standing balance support: Bilateral upper extremity supported, During functional activity, No upper extremity supported ?Standing balance-Leahy Scale: Fair ?Standing balance comment: static standing no UE support ?  ?  ?  ?  ?  ?  ?  ?  ?  ?  ?  ?   ? ?ADL either performed or assessed with clinical judgement  ? ?ADL Overall ADL's : Needs assistance/impaired ?  ?  ?  ?  ?Upper Body Bathing: Modified independent;Sitting ?Upper Body Bathing Details (indicate cue type and reason): shower while sitting at [facility] ?Lower Body Bathing: Minimal assistance;Sit to/from stand ?  ?Upper Body Dressing : Modified independent;Sitting ?  ?Lower Body Dressing: Moderate assistance;Sit to/from stand ?  ?  ?  ?  ?  ?Tub/ Shower Transfer: Min guard;Ambulation;BSC/3in1;Grab bars ?  ?Functional mobility during ADLs: Supervision/safety;Rolling walker (2 wheels) ?General ADL Comments: Completing bathing in shower. Min Guard A for shower transfer as pt unable to push through UE to side step (limited space to place walker). Mod I for UB bathing and dressing. ?  ? ?Extremity/Trunk Assessment Upper Extremity Assessment ?Upper Extremity Assessment: Overall WFL for tasks assessed ?  ?  Lower Extremity Assessment ?Lower Extremity Assessment: Defer to PT evaluation ?  ?  ?   ? ?Vision   ?  ?  ?Perception   ?  ?Praxis   ?  ? ?Cognition Arousal/Alertness: Awake/alert ?Behavior During Therapy: [facility] for tasks assessed/performed, Anxious ?Overall Cognitive Status: Within Functional Limits for tasks assessed ?  ?  ?  ?  ?  ?  ?  ?  ?  ?  ?  ?  ?  ?  ?  ?  ?General Comments: Fearful of pain and thus slightly self limiting ?  ?  ?   ?Exercises   ? ?  ?Shoulder Instructions   ? ? ?  ?General Comments VSS  ? ? ?Pertinent Vitals/ Pain       Pain Assessment ?Pain Assessment: Faces ?Faces Pain Scale: Hurts little more ?Pain Location: low back and bil legs ?Pain Descriptors / Indicators: Grimacing, Guarding, Shooting ?Pain Intervention(s): Monitored during session, Repositioned ? ?Home Living   ?  ?  ?  ?  ?  ?  ?  ?  ?  ?  ?  ?  ?  ?  ?  ?  ?  ?  ? ?  ?Prior Functioning/Environment    ?  ?  ?  ?   ? ?Frequency ? Min 2X/week  ? ? ? ? ?  ?Progress Toward Goals ? ?OT Goals(current goals can now be found in the care plan section) ? Progress towards OT goals: Progressing toward goals ? ?Acute Rehab OT Goals ?OT Goal Formulation: With patient ?Time For Goal Achievement: 09/05/21 ?Potential to Achieve Goals: Good ?ADL Goals ?Pt Will Perform Grooming: with modified independence;standing ?Pt Will Perform Lower Body Dressing: with mod assist;sit to/from stand ?Pt Will Transfer to Toilet: with supervision;ambulating;regular height toilet;grab bars ?Pt Will Perform Toileting - Clothing Manipulation and hygiene: with modified independence;sit to/from stand  ?Plan Discharge plan needs to be updated   ? ?Co-evaluation ? ? ?   ?  ?  ?  ?  ? ?  ?AM-PAC OT "6 Clicks" Daily Activity     ?Outcome Measure ? ? Help from another person eating meals?: A Little ?Help from another person taking care of personal grooming?: A Little ?Help from another person toileting, which includes using toliet, bedpan, or urinal?: A Little ?Help from another person bathing (including washing, rinsing, drying)?: A Lot ?Help from another  person to put on and taking off regular upper body clothing?: A Little ?Help from another person to put on and taking off regular lower body clothing?: A Lot ?6 Click Score: 16 ? ?  ?End of Session Equipment Utilized During Treatment: Rolling walker (2 wheels) ? ?OT Visit Diagnosis: Muscle weakness (generalized) (M62.81);Pain ?Pain - Right/Left: Right ?Pain - part of body: Leg ?  ?Activity Tolerance Patient tolerated treatment well ?  ?Patient Left Other (comment) (with PT) ?  ?Nurse Communication Mobility status ?  ? ?   ? ?Time: [id number] ?OT Time Calculation (min): 66 min ? ?Charges: OT General Charges ?$OT Visit: 1 Visit ?OT Treatments ?$Self Care/Home Management : 53-67 mins ? ?Sarra Rachels MSOT, OTR/L ?Acute Rehab ?Pager: [phone] ?Office: [phone] ? ?Shanteria Laye M Joreen Swearingin ?08/29/2021, 5:09 PM ? ? ?

## 2021-08-29 NOTE — Plan of Care (Signed)
  Problem: Education: Goal: Knowledge of General Education information will improve Description: Including pain rating scale, medication(s)/side effects and non-pharmacologic comfort measures Outcome: Progressing   Problem: Elimination: Goal: Will not experience complications related to bowel motility Outcome: Progressing Goal: Will not experience complications related to urinary retention Outcome: Progressing   

## 2021-08-29 NOTE — Progress Notes (Signed)
Nutrition Follow-up ? ?DOCUMENTATION CODES:  ? ?Not applicable ? ?INTERVENTION:  ? ?Continue Boost Breeze po BID, each supplement provides 250 kcal and 9 grams of protein. ? ?Continue Ensure Enlive po once daily, each supplement provides 350 kcal and 20 grams of protein. ? ?Continue MVI with minerals daily. ? ?NUTRITION DIAGNOSIS:  ? ?Increased nutrient needs related to acute illness as evidenced by estimated needs. ? ?Ongoing  ? ?GOAL:  ? ?Patient will meet greater than or equal to 90% of their needs ? ?Progressing  ? ?MONITOR:  ? ?PO intake, Supplement acceptance, Labs, Weight trends ? ?REASON FOR ASSESSMENT:  ? ?Consult ?Assessment of nutrition requirement/status ? ?ASSESSMENT:  ? ?36 y.o. 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 She presented to the ED with severe low back pain for several weeks and intermittent fevers at home. She was admitted with severe sepsis and endocarditis of tricuspid valve. ? ?PICC line in place for prolonged antibiotic therapy. ?Patient to remain inpatient until finished with IV antibiotics.  ?  ?Patient is on a regular diet. ?Meal intakes documented at 75-100%. ? ?Patient reports good appetite most of the time and good intake of meals. She is drinking supplements once or twice daily. She likes Ensure and [employer].  ? ?Labs reviewed.  ?Medications reviewed and include ferrous gluconate, Mag-Ox, MVI with minerals, thiamine. ? ?Diet Order:   ?Diet Order   ? ?       ?  Diet regular Room service appropriate? Yes; Fluid consistency: Thin  Diet effective now       ?  ? ?  ?  ? ?  ? ? ?EDUCATION NEEDS:  ? ?Not appropriate for education at this time ? ?Skin:  Skin Assessment: Reviewed RN Assessment ? ?Last BM:  4/24 type 3 ? ?Height:  ? ?Ht Readings from Last 1 Encounters:  ?08/08/21 5\' 6"  (1.676 m)  ? ? ?Weight:  ? ?Wt Readings from  Last 1 Encounters:  ?08/09/21 87.5 kg  ? ? ?Ideal Body Weight:  59.1 kg ? ?BMI:  Body mass index is 31.14 kg/m?. ? ?Estimated Nutritional Needs:  ? ?Kcal:  2100-2400 kcal ? ?Protein:  105-120 grams ? ?Fluid:  >/= 2.3 L/day ? ? ? ?10/09/21 RD, LDN, CNSC ?Please refer to Amion for contact information.                                                       ? ?

## 2021-08-29 NOTE — Progress Notes (Signed)
Physical Therapy Treatment ?Patient Details ?Name: Latoya [name] ?MRN: [id number] ?DOB: [birth year] ?Today's Date: 08/29/2021 ? ? ?History of Present Illness 36 y.o. female admitted 08/02/21 with back pain, hypotension, hyponatremia, leukocytosis and neutrophilia. Found to have bacteremia possible sepitc pulmonary emboli from endocarditis. PMH: IVDU, heroin abuse, ongoing. ? ?  ?PT Comments  ? ? Pt received seated on shower chair (3in1) in care of OT, agreeable to therapy session with goal of gait progression. Pt c/o moderate to severe back and LE pain after performing ADL tasks/shower with OT assist prior to PT session so limited to household distance gait progression with RW support and min guard for safety. Pt with continued difficulty with L hip flexion due to pain/weakness, will continue to emphasize L hip ROM/strengthening in next session and plan to trial curb step if able. Disposition updated per discussion with supervising PT Lorrin Goodell, patient and pt reports she may be able to stay with her mother Latoya [name] (who is caring for pt's children), pt reports she is "still working on" disposition. Pending safe discharge location, disposition updated to OPPT.  ?Recommendations for follow up therapy are one component of a multi-disciplinary discharge planning process, led by the attending physician.  Recommendations may be updated based on patient status, additional functional criteria and insurance authorization. ? ?Follow Up Recommendations ? Outpatient PT ?  ?  ?Assistance Recommended at Discharge Intermittent Supervision/Assistance  ?Patient can return home with the following A little help with walking and/or transfers;A little help with bathing/dressing/bathroom;Assistance with cooking/housework;Direct supervision/assist for medications management;Assist for transportation;Help with stairs or ramp for entrance ?  ?Equipment Recommendations ? Rolling walker (2 wheels);BSC/3in1  ?  ?Recommendations for Other Services    ? ? ?  ?Precautions / Restrictions Precautions ?Precautions: Fall;Back ?Precaution Comments: Contact precs, monitor HR, pain management, reviewed spinal precautions for pain management ?Required Braces or Orthoses: Spinal Brace (for pain management) ?Spinal Brace: Lumbar corset ?Restrictions ?Weight Bearing Restrictions: No  ?  ? ?Mobility ? Bed Mobility ?Overal bed mobility: Needs Assistance ?Bed Mobility: Sit to Supine ?  ?  ?  ?Sit to supine: Min assist, HOB elevated ?  ?General bed mobility comments: minA for BLE return to supine, slow pace, pt needing HOB >25 deg for comfort, unable to tolerate log rolling as this exacerbates her pain ?  ? ?Transfers ?Overall transfer level: Needs assistance ?Equipment used: Rolling walker (2 wheels) ?Transfers: Sit to/from Stand ?Sit to Stand: Supervision ?  ?  ?  ?  ?  ?General transfer comment: Supervision for safety, cues for hand placement, increased time standing from 3in1 and sitting down to EOB ?  ? ?Ambulation/Gait ?Ambulation/Gait assistance: Min guard, Supervision ?Gait Distance (Feet): 22 Feet ?Assistive device: Rolling walker (2 wheels) ?Gait Pattern/deviations: Step-to pattern, Shuffle, Decreased dorsiflexion - left, Decreased weight shift to left, Trunk flexed ?  ?  ?  ?General Gait Details: Pt with slow, antalgic gait pattern, displaying increased difficulty advancing step on L. Decreased L foot clearance. Cues provided to exaggerate marching when taking steps to improve feet clearance and heel strike, good momentary success noted. No LOB, min guard-supervision for safety, greatly increased time/effort to perform. ? ? ? ?  ?Balance Overall balance assessment: Needs assistance ?Sitting-balance support: No upper extremity supported, Feet supported ?Sitting balance-Leahy Scale: Good ?  ?  ?Standing balance support: Bilateral upper extremity supported, During functional activity, No upper extremity supported ?Standing balance-Leahy Scale: Fair ?Standing balance  comment: static standing no UE support ?  ?  ?  ?  ?  ?  ?  ?  ?  ?  ?  ?  ? ?  ?  Cognition Arousal/Alertness: Awake/alert ?Behavior During Therapy: [facility] for tasks assessed/performed, Anxious ?Overall Cognitive Status: Within Functional Limits for tasks assessed ?  ?  ?  ?  ?  ?  ?  ?  ?  ?  ?  ?  ?  ?  ?  ?  ?General Comments: Fearful of pain and thus slightly self limiting. ?  ?  ? ?  ?Exercises   ? ?  ?General Comments General comments (skin integrity, edema, etc.): HR to 100 bpm resting post-exertion; did not assess during gait trial as her tele leads were off after showering but pt assisted to re-don them once back to EOB. No dizziness reported during gait however when asked afterward, pt states "I was a little lightheaded" so may want to check orthostatics next session ?  ?  ? ?Pertinent Vitals/Pain Pain Assessment ?Pain Assessment: Faces ?Faces Pain Scale: Hurts little more ?Pain Location: bottom after sitting on shower seat (3in1), BLE with amb ?Pain Descriptors / Indicators: Grimacing, Guarding, Shooting ?Pain Intervention(s): Monitored during session, Premedicated before session, Repositioned  ? ? ? ?PT Goals (current goals can now be found in the care plan section) Acute Rehab PT Goals ?Patient Stated Goal: To improve, decreased back/sacral pain ?PT Goal Formulation: With patient ?Time For Goal Achievement: 09/01/21 ?Progress towards PT goals: Progressing toward goals ? ?  ?Frequency ? ? ? Min 3X/week ? ? ? ?  ?PT Plan Current plan remains appropriate  ? ? ?   ?AM-PAC PT "6 Clicks" Mobility   ?Outcome Measure ? Help needed turning from your back to your side while in a flat bed without using bedrails?: A Lot ?Help needed moving from lying on your back to sitting on the side of a flat bed without using bedrails?: A Little ?Help needed moving to and from a bed to a chair (including a wheelchair)?: A Little ?Help needed standing up from a chair using your arms (e.g., wheelchair or bedside chair)?: A  Little ?Help needed to walk in hospital room?: A Little ?Help needed climbing 3-5 steps with a railing? : A Lot ?6 Click Score: 16 ? ?  ?End of Session Equipment Utilized During Treatment: Back brace ?Activity Tolerance: Patient tolerated treatment well ?Patient left: in bed;with call bell/phone within reach;with bed alarm set ?Nurse Communication: Mobility status ?PT Visit Diagnosis: Other abnormalities of gait and mobility (R26.89);Muscle weakness (generalized) (M62.81);Difficulty in walking, not elsewhere classified (R26.2);Unsteadiness on feet (R26.81);Pain ?Pain - part of body:  (back) ?  ? ? ?Time: [id number] ?PT Time Calculation (min) (ACUTE ONLY): 28 min ? ?Charges:  $Gait Training: 8-22 mins ?$Therapeutic Activity: 8-22 mins          ?          ? ?Matther Labell P., PTA ?Acute Rehabilitation Services ?Secure Chat Preferred 9a-5:30pm ?Office: [phone]  ? ? ?Kara Pacer Mckinlee Dunk ?08/29/2021, 6:24 PM ? ?

## 2021-08-29 NOTE — TOC Benefit Eligibility Note (Signed)
Patient Advocate Encounter ? ?Insurance verification completed.   ? ?The patient is currently admitted and upon discharge could be taking Lyrica 75mg . ? ?The current 30 day co-pay is, $0.  ? ?The patient is currently admitted and upon discharge could be taking Baclofen 10mg  ? ?The current 30 day co-pay is, $0.  ? ?The patient is currently admitted and upon discharge could be taking Duloxetine 40 mg ? ?Requires Prior Authorization.  ? ? ?The patient is insured through [employer] [place] [employer] . ? ? ? ? , CPhT ?Pharmacy Patient Advocate Specialist ?[facility] Patient Advocate Team ?Direct Number: [phone]  Fax: [phone] ? ? ? ? ? ?  ?

## 2021-08-29 NOTE — Progress Notes (Signed)
Pharmacy Antibiotic Note ? ?Latoya [name] is a 36 y.o. female admitted on 08/02/2021 with MRSA bacteremia-  septic pulmonary emboli/endocarditis. Pharmacy has been consulted for Vancomycin dosing. Scr is at baseline of 0.7 with estimated CrCl > 100 ml/min.  ? ?On 4/24, vancomycin peak of 38 and trough of 14 on Vancomycin 1000 mg IV q12h.  Calculated AUC 655 which is higher than goal range of 450-600. ? ? ?Plan: ?- Adjust vancomycin to 1750 mg every 24 hours - Predicted AUC 571 ?- Check VT and VP to dose adjust as needed on Mondays ?- Continue vancomycin till 09/16/21 (6 wks from neg bcx) ? ?Height: 5\' 6"  (167.6 cm) ?Weight: 87.5 kg (192 lb 14.4 oz) ?IBW/kg (Calculated) : 59.3 ? ?Temp (24hrs), Avg:97.9 ?F (36.6 ?C), Min:97.5 ?F (36.4 ?C), Max:98.2 ?F (36.8 ?C) ? ?Recent Labs  ?Lab 08/25/21 ?0510 08/28/21 ?08/30/21 08/28/21 ?0900 08/29/21 ?08/31/21  ?WBC  --  9.4  --   --   ?CREATININE 0.78 0.70  --   --   ?VANCOTROUGH  --   --   --  14*  ?VANCOPEAK  --   --  38  --   ? ?  ?Estimated Creatinine Clearance: 110.4 mL/min (by C-G formula based on SCr of 0.7 mg/dL).   ? ?Allergies  ?Allergen Reactions  ? Naltrexone Anxiety  ?  Severe anxiety, restlessness, vomiting  ? ? ?Antimicrobials this admission: ?Cefepime 3/29 >> 3/30 ?Flagyl 3/29 >> 3/30 ?Vancomycin 3/29 >> ? ?Dose adjustments this admission: ?4/3-4/4 VP 38 VT 14- AUC 697 >>Adj to 750 q 8 hours  ?4/10: VP 29, VT 15, AUC 591.8, Cmin 12.5 >> 1gm IV q12h for calculated AUC 525.5 ?4/17: VP 30, VT 14, AUC 600 ?4/24: VP 38, VT 14, AUC 655 >> Adj to 1750 q24h ? ?Microbiology results: ?3/29 Influenza/COVID>> neg ?3/29 MRSA PCR >> positive ?3/29 BCx >> GPC in 4/4 bottles (BCID MRSA) ?3/31 BCX: MRSA - only 1 set drawn  ?4/2 BCx: NG ? ? ? ?Thank you for allowing 4/31 to participate in this patients care. ?[place], PharmD ?08/29/2021 11:35 AM ? ?**Pharmacist phone directory can be found on [email or web address] listed under [facility]** ? ? ? ? ?

## 2021-08-29 NOTE — Progress Notes (Signed)
?PROGRESS NOTE ? ? ? ?Latoya [name]  [id number] DOB: Apr 12, 1986 DOA: 08/02/2021 ?PCP: Pcp, No  ? ?  ?Brief Narrative:  ?35/F w/ history of IV heroin abuse and MSSA tricuspid valve endocarditis 2020 as well as MRSA discitis and osteomyelitis 2021 who presented to the [facility] ED 08/02/2021 with low back pain cough and fevers.  MRI of the lumbar spine was negative for infection but further work-up revealed MRSA bacteremia with tricuspid valve vegetation and septic pulmonary emboli. Ultimately the patient was transferred to [facility] for TCTS eval.  She was taken to the OR 08/11/21 for a planned angio VAC procedure, but the discovery of a significant PFO required the abortion of this procedure. She has remained stable on IV vancomycin per ID recommendations, to complete therapy 5/13 Handy repeat TTE about 5 to 7 days prior to discontinuation of antibiotics. ? ?New events last 24 hours / Subjective: ?Has been refusing PT per notes. Has been asking for dilaudid every 4 hours. Discussed with her importance of mobility and ambulation and level of tolerance for pain ? ?Assessment & Plan: ?  ?Principal Problem: ?  MRSA bacteremia ?Active Problems: ?  Endocarditis of tricuspid valve ?  History of bacterial endocarditis ?  Iron deficiency anemia ?  IV drug abuse ([place]) ?  Opioid use disorder ?  Septic pulmonary embolism ([place]) ? ? ?MRSA bacteremia, tricuspid valve endocarditis, septic pulmonary emboli ?-Severe sepsis present on admission, sepsis has resolved ?-Continue vancomycin through 5/13.  Plan to repeat TTE 5 to 7 days prior to discontinuation of antibiotics ? ?Chronic lumbar pain, history of lumbar osteomyelitis ?-MRI this admission without evidence of discitis or osteomyelitis ?-Continue oxycodone, Lyrica, baclofen, Cymbalta, Dilaudid, ibuprofen. Scheduled oxycodone in effort to wean down/off on dilaudid  ? ?History of IV drug abuse ?-Counseled on cessation ? ?Debility ?-PT OT recommending acute inpatient rehab,  although may not qualify due to history of drug abuse ?-Continue PT OT ?-TOC to follow ? ? ?DVT prophylaxis: on Xarelto for ppx as she refused lovenox  ?rivaroxaban (XARELTO) tablet 10 mg Start: 08/13/21 1700 ?Place and maintain sequential compression device Start: 08/07/21 1350 ?rivaroxaban (XARELTO) tablet 10 mg  ?Code Status: Full ?Family Communication: None at bedside ?Disposition Plan:  ?Status is: Inpatient ?Remains inpatient appropriate because: Remains on IV antibiotics ? ? ?Antimicrobials:  ?Anti-infectives (From admission, onward)  ? ? Start     Dose/Rate Route Frequency Ordered Stop  ? 08/29/21 2200  vancomycin (VANCOREADY) IVPB 1750 mg/350 mL       ? 1,750 mg ?175 mL/hr over 120 Minutes Intravenous Every 24 hours 08/29/21 1121    ? 08/21/21 1800  vancomycin (VANCOREADY) IVPB 750 mg/150 mL  Status:  Discontinued       ? 750 mg ?150 mL/hr over 60 Minutes Intravenous Every 12 hours 08/21/21 1344 08/21/21 1347  ? 08/21/21 1800  vancomycin (VANCOCIN) IVPB 1000 mg/200 mL premix  Status:  Discontinued       ? 1,000 mg ?200 mL/hr over 60 Minutes Intravenous Every 12 hours 08/21/21 1347 08/29/21 1121  ? 08/15/21 0600  vancomycin (VANCOCIN) IVPB 1000 mg/200 mL premix  Status:  Discontinued       ? 1,000 mg ?200 mL/hr over 60 Minutes Intravenous Every 12 hours 08/14/21 2006 08/21/21 1344  ? 08/09/21 1000  vancomycin (VANCOREADY) IVPB 750 mg/150 mL  Status:  Discontinued       ? 750 mg ?150 mL/hr over 60 Minutes Intravenous Every 8 hours 08/09/21 0237 08/14/21 2006  ?  08/08/21 1700  vancomycin (VANCOREADY) IVPB 1500 mg/300 mL  Status:  Discontinued       ? 750 mg ?75 mL/hr over 120 Minutes Intravenous Every 8 hours 08/08/21 0920 08/09/21 0237  ? 08/05/21 1800  vancomycin (VANCOREADY) IVPB 1500 mg/300 mL  Status:  Discontinued       ? 1,500 mg ?150 mL/hr over 120 Minutes Intravenous Every 12 hours 08/05/21 0618 08/08/21 0920  ? 08/04/21 1700  vancomycin (VANCOREADY) IVPB 1250 mg/250 mL  Status:  Discontinued       ?  1,250 mg ?166.7 mL/hr over 90 Minutes Intravenous Every 12 hours 08/04/21 1613 08/05/21 0618  ? 08/03/21 1700  vancomycin (VANCOCIN) IVPB 1000 mg/200 mL premix  Status:  Discontinued       ? 1,000 mg ?200 mL/hr over 60 Minutes Intravenous Every 12 hours 08/03/21 1039 08/03/21 1042  ? 08/03/21 1700  Vancomycin (VANCOCIN) 1,250 mg in sodium chloride 0.9 % 250 mL IVPB  Status:  Discontinued       ? 1,250 mg ?166.7 mL/hr over 90 Minutes Intravenous Every 12 hours 08/03/21 1042 08/03/21 1111  ? 08/03/21 1700  Vancomycin (VANCOCIN) 1,250 mg in sodium chloride 0.9 % 250 mL IVPB  Status:  Discontinued       ? 1,250 mg ?166.7 mL/hr over 90 Minutes Intravenous Every 12 hours 08/03/21 1112 08/03/21 1124  ? 08/03/21 1700  Vancomycin (VANCOCIN) 1,250 mg in sodium chloride 0.9 % 250 mL IVPB  Status:  Discontinued       ? 1,250 mg ?175 mL/hr over 90 Minutes Intravenous Every 12 hours 08/03/21 1124 08/04/21 1614  ? 08/03/21 0600  vancomycin (VANCOCIN) IVPB 750 mg/150 ml premix  Status:  Discontinued       ? 750 mg ?150 mL/hr over 60 Minutes Intravenous Every 12 hours 08/02/21 2143 08/03/21 0020  ? 08/03/21 0600  vancomycin (VANCOREADY) IVPB 750 mg/150 mL  Status:  Discontinued       ? 750 mg ?150 mL/hr over 60 Minutes Intravenous Every 12 hours 08/03/21 0022 08/03/21 1039  ? 08/03/21 0200  ceFEPIme (MAXIPIME) 2 g in sodium chloride 0.9 % 100 mL IVPB  Status:  Discontinued       ? 2 g ?200 mL/hr over 30 Minutes Intravenous Every 8 hours 08/02/21 2138 08/03/21 0946  ? 08/02/21 2200  metroNIDAZOLE (FLAGYL) IVPB 500 mg  Status:  Discontinued       ? 500 mg ?100 mL/hr over 60 Minutes Intravenous Every 12 hours 08/02/21 2135 08/03/21 0946  ? 08/02/21 1545  vancomycin (VANCOCIN) IVPB 1000 mg/200 mL premix       ? 1,000 mg ?200 mL/hr over 60 Minutes Intravenous  Once 08/02/21 1537 08/02/21 2138  ? 08/02/21 1545  ceFEPIme (MAXIPIME) 2 g in sodium chloride 0.9 % 100 mL IVPB       ? 2 g ?200 mL/hr over 30 Minutes Intravenous  Once 08/02/21  1537 08/02/21 1933  ? ?  ? ? ? ?Objective: ?Vitals:  ? 08/28/21 2354 08/29/21 0342 08/29/21 0812 08/29/21 1134  ?BP: 110/77 108/80 106/87 117/76  ?Pulse: 92 85 88 92  ?Resp: 18 20 17 20   ?Temp: 98.2 ?F (36.8 ?C) 98.2 ?F (36.8 ?C) 97.7 ?F (36.5 ?C) 98.1 ?F (36.7 ?C)  ?TempSrc: Oral Oral Oral Oral  ?SpO2: 100% 97% 96% 97%  ?Weight:      ?Height:      ? ? ?Intake/Output Summary (Last 24 hours) at 08/29/2021 1201 ?Last data filed at 08/29/2021 1135 ?Johney Maine  per 24 hour  ?Intake 1410.4 ml  ?Output 2300 ml  ?Net -889.6 ml  ? ? ?Filed Weights  ? 08/03/21 1036 08/08/21 1111 08/09/21 1607  ?Weight: 82.2 kg 82.2 kg 87.5 kg  ? ? ?Examination:  ?General exam: Appears calm and comfortable  ?Psychiatry: Mood & affect appropriate.  ? ?Data Reviewed: I have personally reviewed following labs and imaging studies ? ?CBC: ?Recent Labs  ?Lab 08/28/21 [id number]  ?WBC 9.4  ?HGB 9.0*  ?HCT 29.4*  ?MCV 79.5*  ?PLT 228  ? ? ?Basic Metabolic Panel: ?Recent Labs  ?Lab 08/25/21 ?0510 08/28/21 [id number]  ?NA 141 137  ?K 4.1 3.7  ?CL 108 105  ?CO2 27 27  ?GLUCOSE 106* 120*  ?BUN 22* 14  ?CREATININE 0.78 0.70  ?CALCIUM 8.5* 8.6*  ? ? ?GFR: ?Estimated Creatinine Clearance: 110.4 mL/min (by C-G formula based on SCr of 0.7 mg/dL). ?Liver Function Tests: ?No results for input(s): AST, ALT, ALKPHOS, BILITOT, PROT, ALBUMIN in the last 168 hours. ?No results for input(s): LIPASE, AMYLASE in the last 168 hours. ?No results for input(s): AMMONIA in the last 168 hours. ?Coagulation Profile: ?No results for input(s): INR, PROTIME in the last 168 hours. ?Cardiac Enzymes: ?No results for input(s): CKTOTAL, CKMB, CKMBINDEX, TROPONINI in the last 168 hours. ?BNP (last 3 results) ?No results for input(s): PROBNP in the last 8760 hours. ?HbA1C: ?No results for input(s): HGBA1C in the last 72 hours. ?CBG: ?No results for input(s): GLUCAP in the last 168 hours. ? ?Lipid Profile: ?No results for input(s): CHOL, HDL, LDLCALC, TRIG, CHOLHDL, LDLDIRECT in the last 72  hours. ?Thyroid Function Tests: ?No results for input(s): TSH, T4TOTAL, FREET4, T3FREE, THYROIDAB in the last 72 hours. ?Anemia Panel: ?No results for input(s): VITAMINB12, FOLATE, FERRITIN, TIBC, IRON, RETICCTPCT in t

## 2021-08-30 DIAGNOSIS — I2690 Septic pulmonary embolism without acute cor pulmonale: Secondary | ICD-10-CM | POA: Diagnosis not present

## 2021-08-30 DIAGNOSIS — R7881 Bacteremia: Secondary | ICD-10-CM | POA: Diagnosis not present

## 2021-08-30 DIAGNOSIS — B9562 Methicillin resistant Staphylococcus aureus infection as the cause of diseases classified elsewhere: Secondary | ICD-10-CM | POA: Diagnosis not present

## 2021-08-30 DIAGNOSIS — E871 Hypo-osmolality and hyponatremia: Secondary | ICD-10-CM | POA: Diagnosis not present

## 2021-08-30 DIAGNOSIS — Z8679 Personal history of other diseases of the circulatory system: Secondary | ICD-10-CM | POA: Diagnosis not present

## 2021-08-30 DIAGNOSIS — D509 Iron deficiency anemia, unspecified: Secondary | ICD-10-CM | POA: Diagnosis not present

## 2021-08-30 MED ORDER — OXYCODONE HCL 5 MG PO TABS
7.5000 mg | ORAL_TABLET | ORAL | Status: DC
  Administered 2021-08-30 – 2021-08-31 (×7): 7.5 mg via ORAL
  Filled 2021-08-30 (×7): qty 2

## 2021-08-30 NOTE — Progress Notes (Signed)
?PROGRESS NOTE ? ? ? ?Bijal Siglin  DXI:[id number] DOB: [birth year] DOA: 08/02/2021 ?PCP: Pcp, No  ? ?  ?Brief Narrative:  ?Patient is a 36 years old female with past medical history of MSSA tricuspid valve endocarditis in 2020 as well as MRSA discitis and osteomyelitis in 2021 with ongoing IV drug abuse presented to hospital with low back pain, cough and fever.  MRI of the lumbar spine was negative for infection but blood culture was positive for MRSA bacteremia with tricuspid valve vegetation and septic pulmonary emboli.  Patient was then seen by CT surgery and underwent angio VAC  procedure, but the discovery of a significant PFO required the abortion of this procedure.  She has been seen by [facility] and plan is to continue antibiotic until 09/16/2021 and to repeat TTE about 5 to 7 days prior to discontinuation of antibiotics. ? ?Subjective: ? ?Today, Patient was seen and examined at bedside.  Nursing staff reported that patient did not want to get out of the bed.  Patient complains of being tired fatigue.  Denies any chest pain, shortness of breath fever or chills. ? ?Assessment & Plan: ?  ?Principal Problem: ?  MRSA bacteremia ?Active Problems: ?  Endocarditis of tricuspid valve ?  History of bacterial endocarditis ?  Iron deficiency anemia ?  IV drug abuse ([facility]) ?  Opioid use disorder ?  Septic pulmonary embolism ([facility]) ? ? ?MRSA bacteremia, tricuspid valve endocarditis, septic pulmonary emboli ?Severe sepsis on presentation.  Sepsis physiology has improved.  Plan is to continue vancomycin through 10/03/2021.  Plan to repeat TTE 5 to 7 days prior to discontinuation of antibiotics ? ?Chronic lumbar pain, history of lumbar osteomyelitis ?-MRI of the lumbosacral spine this time which showed no evidence of discitis or osteomyelitis.   ?-Continue oxycodone, Lyrica, baclofen, Cymbalta, Dilaudid, ibuprofen. Scheduled oxycodone in effort to wean down/off on dilaudid. ? ?History of IV drug abuse ?Emphasized cessation   ? ?Debility ?Physical therapy Occupational Therapy recommending outpatient PT OT at this time. ? ?DVT prophylaxis: on Xarelto for ppx as she refused lovenox  ? ?Family Communication:  ?Spoke with the patient at bedside. ? ?Disposition Plan: Home ? ?Status is: Inpatient ? ?Remains inpatient appropriate because: on IV antibiotics,  ? ?Antimicrobials:  ?Vancomycin>> ? ? ?Objective: ?Vitals:  ? 08/29/21 1654 08/29/21 2100 08/29/21 2335 08/30/21 0357  ?BP: 111/79 100/73 98/77 112/82  ?Pulse:  99 100 95  ?Resp: 17 20 20 20   ?Temp: 98.1 ?F (36.7 ?C) 98.7 ?F (37.1 ?C) 99.3 ?F (37.4 ?C) 97.7 ?F (36.5 ?C)  ?TempSrc: Oral Oral Oral Axillary  ?SpO2:  98% 96% 96%  ?Weight:      ?Height:      ? ? ?Intake/Output Summary (Last 24 hours) at 08/30/2021 0827 ?Last data filed at 08/30/2021 0400 ?Gross per 24 hour  ?Intake 1309.98 ml  ?Output 2800 ml  ?Net -1490.02 ml  ? ?Filed Weights  ? 08/03/21 1036 08/08/21 1111 08/09/21 1607  ?Weight: 82.2 kg 82.2 kg 87.5 kg  ? ? ?Examination:  ?General:  Average built, not in obvious distress ?HENT:   No scleral pallor or icterus noted. Oral mucosa is moist.  ?Chest:   Diminished breath sounds bilaterally. No crackles or wheezes.  ?CVS: S1 &S2 heard. No murmur.  Regular rate and rhythm. ?Abdomen: Soft, nontender, nondistended.  Bowel sounds are heard.   ?Extremities: No cyanosis, clubbing or edema.  Peripheral pulses are palpable.  Left upper extremity PICC line in place ?Psych: Alert, awake and oriented, normal  mood ?CNS:  No cranial nerve deficits.  Power equal in all extremities.   ?Skin: Warm and dry.   ? ? ?Data Reviewed: I have personally reviewed following labs and imaging studies ? ?CBC: ?Recent Labs  ?Lab 08/28/21 ?1700  ?WBC 9.4  ?HGB 9.0*  ?HCT 29.4*  ?MCV 79.5*  ?PLT 228  ? ?Basic Metabolic Panel: ?Recent Labs  ?Lab 08/25/21 ?0510 08/28/21 ?1749  ?NA 141 137  ?K 4.1 3.7  ?CL 108 105  ?CO2 27 27  ?GLUCOSE 106* 120*  ?BUN 22* 14  ?CREATININE 0.78 0.70  ?CALCIUM 8.5* 8.6*   ? ?GFR: ?Estimated Creatinine Clearance: 110.4 mL/min (by C-G formula based on SCr of 0.7 mg/dL). ?Liver Function Tests: ?No results for input(s): AST, ALT, ALKPHOS, BILITOT, PROT, ALBUMIN in the last 168 hours. ?No results for input(s): LIPASE, AMYLASE in the last 168 hours. ?No results for input(s): AMMONIA in the last 168 hours. ?Coagulation Profile: ?No results for input(s): INR, PROTIME in the last 168 hours. ?Cardiac Enzymes: ?No results for input(s): CKTOTAL, CKMB, CKMBINDEX, TROPONINI in the last 168 hours. ?BNP (last 3 results) ?No results for input(s): PROBNP in the last 8760 hours. ?HbA1C: ?No results for input(s): HGBA1C in the last 72 hours. ?CBG: ?No results for input(s): GLUCAP in the last 168 hours. ? ?Lipid Profile: ?No results for input(s): CHOL, HDL, LDLCALC, TRIG, CHOLHDL, LDLDIRECT in the last 72 hours. ?Thyroid Function Tests: ?No results for input(s): TSH, T4TOTAL, FREET4, T3FREE, THYROIDAB in the last 72 hours. ?Anemia Panel: ?No results for input(s): VITAMINB12, FOLATE, FERRITIN, TIBC, IRON, RETICCTPCT in the last 72 hours. ?Sepsis Labs: ?No results for input(s): PROCALCITON, LATICACIDVEN in the last 168 hours. ? ?No results found for this or any previous visit (from the past 240 hour(s)).  ? ? ?Radiology Studies: ?No results found. ? ? ? ?Scheduled Meds: ? baclofen  10 mg Oral Q6H  ? Chlorhexidine Gluconate Cloth  6 each Topical Daily  ? DULoxetine  40 mg Oral Daily  ? feeding supplement  1 Container Oral BID BM  ? feeding supplement  237 mL Oral Q24H  ? ferrous gluconate  324 mg Oral Q breakfast  ? Gerhardt's butt cream   Topical BID  ? magnesium oxide  400 mg Oral BID  ? multivitamin with minerals  1 tablet Oral Daily  ? nicotine  21 mg Transdermal Daily  ? oxyCODONE  7.5 mg Oral Q4H  ? pantoprazole  40 mg Oral Daily  ? pregabalin  75 mg Oral TID  ? rivaroxaban  10 mg Oral Q supper  ? thiamine  100 mg Oral Daily  ? ?Continuous Infusions: ? lactated ringers Stopped (08/11/21 1336)  ?  vancomycin 1,750 mg (08/29/21 2155)  ? ? ? LOS: 28 days  ? ? ?Joycelyn Das, MD ?[facility] ?08/30/2021, 8:27 AM  ? ?Available via Epic secure chat 7am-7pm ?After these hours, please refer to coverage provider listed on [email or web address] ? ?

## 2021-08-30 NOTE — Progress Notes (Addendum)
Physical Therapy Treatment ?Patient Details ?Name: Latoya [name] ?MRN: [id number] ?DOB: [birth year] ?Today's Date: 08/30/2021 ? ? ?History of Present Illness 36 y.o. female admitted 08/02/21 with back pain, hypotension, hyponatremia, leukocytosis and neutrophilia. Found to have bacteremia possible sepitc pulmonary emboli from endocarditis. PMH: IVDU, heroin abuse, ongoing. ? ?  ?PT Comments  ? ? Pt received in supine, agreeable to physical therapy session with emphasis on gait training. Pt has made progress toward PT goals exhibiting increased activity tolerance and ambulation distance. Practiced basic LE exercises in supine. Pt ambulated 54 feet with RW and min guard assist with chair follow for safety and cueing provided to exaggerate marching when taking steps to improve feet clearance and heel strike. Monitored HR while ambulating in hallway and HR max 116. Pt returned to chair complaining of increased pain in lower back from pain level of 6/10 to 9/10. Pt would continue to benefit from increased distance while gait training with continued emphasis on increased step length and speed during functional activity. Pt left in chair with ice pack for low back pain, with call bell within reach, and bed alarm set. ?  ?Recommendations for follow up therapy are one component of a multi-disciplinary discharge planning process, led by the attending physician.  Recommendations may be updated based on patient status, additional functional criteria and insurance authorization. ? ?Follow Up Recommendations ? Outpatient PT ?  ?  ?Assistance Recommended at Discharge Intermittent Supervision/Assistance  ?Patient can return home with the following A little help with walking and/or transfers;A little help with bathing/dressing/bathroom;Assistance with cooking/housework;Direct supervision/assist for medications management;Assist for transportation;Help with stairs or ramp for entrance ?  ?   ?   ?Precautions / Restrictions  Precautions ?Precautions: Fall;Back ?Precaution Comments: Contact precs, monitor HR, pain management, reviewed spinal precautions for pain management ?Required Braces or Orthoses: Spinal Brace ?Spinal Brace: Lumbar corset ?Restrictions ?Weight Bearing Restrictions: No  ?  ? ?Mobility ? Bed Mobility ?Overal bed mobility: Needs Assistance ?Bed Mobility: Sit to Supine ?  ?Supine to sit: Supervision, HOB elevated ?  ?General bed mobility comments: slow pace, pt needing HOB >25 deg for comfort, unable to tolerate log rolling as this exacerbates her pain ?  ? ?Transfers ?Overall transfer level: Needs assistance ?Equipment used: Rolling walker (2 wheels) ?Transfers: Sit to/from Stand ?Sit to Stand: Supervision ?  ?  ?  ?General transfer comment: Supervision for safety, increased time standing from bed and sitting down in chair. ?  ? ?Ambulation/Gait ?Ambulation/Gait assistance: Min guard, Supervision ?Gait Distance (Feet): 54 Feet ?Assistive device: Rolling walker (2 wheels) ?Gait Pattern/deviations: Step-to pattern, Shuffle, Decreased dorsiflexion - left, Decreased weight shift to left, Trunk flexed ?Gait velocity: reduced ?  ?  ?General Gait Details: Pt with slow, antalgic gait pattern, displaying increased difficulty advancing step on L. Decreased L foot clearance. Cues provided to exaggerate marching when taking steps to improve feet clearance and heel strike, good momentary success noted. No LOB, min guard-supervision for safety, greatly increased time/effort to perform. ? ? ? ? ? ?  ?Balance Overall balance assessment: Needs assistance ?Sitting-balance support: No upper extremity supported, Feet supported ?Sitting balance-Leahy Scale: Good ?Sitting balance - Comments: No LOB sitting EOB without UE support ?  ?Standing balance support: Bilateral upper extremity supported, During functional activity ?Standing balance-Leahy Scale: Fair ?Standing balance comment: unable to take BUE off walker for ADLs ?  ?  ?  ? ?   ?Cognition Arousal/Alertness: Awake/alert ?Behavior During Therapy: [facility] for tasks assessed/performed, Anxious ?Overall Cognitive Status: Within  Functional Limits for tasks assessed ?  ?  ?  ?  ?General Comments: Fearful of pain and thus slightly self limiting. ?  ?  ? ?  ?Exercises General Exercises - Upper Extremity ?Elbow Flexion: AROM, Both, 10 reps, Supine ?General Exercises - Lower Extremity ?Heel Slides: AROM, Both, 5 reps, Supine ?Straight Leg Raises: AROM, Both, 5 reps, Supine ? ?  ?General Comments General comments (skin integrity, edema, etc.): HR max 116 with exertion ?  ?  ? ?Pertinent Vitals/Pain Pain Assessment ?Pain Assessment: 0-10 ?Pain Score: 9  ?Pain Location: pt initially said pain in lower back was 6/10 but increased to 9/10 during ambulation and pt diaphoretic ?Pain Intervention(s): Monitored during session, Repositioned, Ice applied  ? ? ?   ?   ? ?PT Goals (current goals can now be found in the care plan section) Acute Rehab PT Goals ?Patient Stated Goal: To improve, decreased back/sacral pain ?PT Goal Formulation: With patient ?Time For Goal Achievement: 09/08/21 (PT did not change date after last goal update) ?Progress towards PT goals: Progressing toward goals ? ?  ?Frequency ? ? ? Min 3X/week ? ? ? ?  ?PT Plan Current plan remains appropriate  ? ? ?   ?AM-PAC PT "6 Clicks" Mobility   ?Outcome Measure ? Help needed turning from your back to your side while in a flat bed without using bedrails?: A Lot ?Help needed moving from lying on your back to sitting on the side of a flat bed without using bedrails?: A Little ?Help needed moving to and from a bed to a chair (including a wheelchair)?: A Little ?Help needed standing up from a chair using your arms (e.g., wheelchair or bedside chair)?: A Little ?Help needed to walk in hospital room?: A Little ?Help needed climbing 3-5 steps with a railing? : A Lot ?6 Click Score: 16 ? ?  ?End of Session Equipment Utilized During Treatment: Gait belt;Back  brace ?Activity Tolerance: Patient tolerated treatment well ?Patient left: with call bell/phone within reach;in chair;with chair alarm set ?Nurse Communication: Mobility status ?PT Visit Diagnosis: Other abnormalities of gait and mobility (R26.89);Muscle weakness (generalized) (M62.81);Difficulty in walking, not elsewhere classified (R26.2);Unsteadiness on feet (R26.81);Pain ?Pain - part of body:  (back) ?  ? ? ?Time: [id number] ?PT Time Calculation (min) (ACUTE ONLY): 42 min ? ?Charges:  $Gait Training: 8-22 mins ?$Therapeutic Exercise: 8-22 mins ?$Therapeutic Activity: 8-22 mins          ?          ? ?Wadie Lessen, SPTA ? ? ? ?Tiffany Mutch ?08/30/2021, 5:03 PM ? ?

## 2021-08-30 NOTE — Progress Notes (Incomplete)
08/30/2021 ?1:31 PM  ?Pt has been encouraged to get up today more than once.  She refuses to get up-she does not feel well and did not sleep last night.   ?

## 2021-08-30 NOTE — Progress Notes (Signed)
Mobility Specialist Progress Note ? ? 08/30/21 1803  ?Therapy Vitals  ?Temp (!) 97.5 ?F (36.4 ?C)  ?Temp Source Oral  ?Resp 20  ?BP 113/78  ?Patient Position (if appropriate) Lying  ?Oxygen Therapy  ?SpO2 98 %  ?Mobility  ?Activity Transferred from chair to bed  ?Level of Assistance Contact guard assist, steadying assist  ?Assistive Device Front wheel walker  ?Distance Ambulated (ft) 6 ft  ?Activity Response Tolerated well  ?$Mobility charge 1 Mobility  ? ?Received in chair requesting to get back to bed secondary to LBP. Inc. time required d/t  dec. pain tolerance. No fault during tx but pt requiring min cues for step by step directions. Left in bed w/ call bell by side and NT present in room.  ? ?Frederico Hamman ?Mobility Specialist ?Phone Number [phone] ? ?

## 2021-08-31 DIAGNOSIS — A4102 Sepsis due to Methicillin resistant Staphylococcus aureus: Secondary | ICD-10-CM | POA: Diagnosis not present

## 2021-08-31 DIAGNOSIS — J189 Pneumonia, unspecified organism: Secondary | ICD-10-CM | POA: Diagnosis not present

## 2021-08-31 DIAGNOSIS — E860 Dehydration: Secondary | ICD-10-CM | POA: Diagnosis not present

## 2021-08-31 DIAGNOSIS — B9562 Methicillin resistant Staphylococcus aureus infection as the cause of diseases classified elsewhere: Secondary | ICD-10-CM | POA: Diagnosis not present

## 2021-08-31 DIAGNOSIS — I2690 Septic pulmonary embolism without acute cor pulmonale: Secondary | ICD-10-CM | POA: Diagnosis not present

## 2021-08-31 DIAGNOSIS — E871 Hypo-osmolality and hyponatremia: Secondary | ICD-10-CM | POA: Diagnosis not present

## 2021-08-31 DIAGNOSIS — R092 Respiratory arrest: Secondary | ICD-10-CM | POA: Diagnosis not present

## 2021-08-31 DIAGNOSIS — G9341 Metabolic encephalopathy: Secondary | ICD-10-CM | POA: Diagnosis not present

## 2021-08-31 DIAGNOSIS — D638 Anemia in other chronic diseases classified elsewhere: Secondary | ICD-10-CM | POA: Diagnosis not present

## 2021-08-31 DIAGNOSIS — Z20822 Contact with and (suspected) exposure to covid-19: Secondary | ICD-10-CM | POA: Diagnosis not present

## 2021-08-31 DIAGNOSIS — I330 Acute and subacute infective endocarditis: Secondary | ICD-10-CM | POA: Diagnosis not present

## 2021-08-31 DIAGNOSIS — R7881 Bacteremia: Secondary | ICD-10-CM | POA: Diagnosis not present

## 2021-08-31 DIAGNOSIS — O0383 Metabolic disorder following complete or unspecified spontaneous abortion: Secondary | ICD-10-CM | POA: Diagnosis not present

## 2021-08-31 LAB — GLUCOSE, CAPILLARY: Glucose-Capillary: 178 mg/dL — ABNORMAL HIGH (ref 70–99)

## 2021-08-31 LAB — BASIC METABOLIC PANEL
Anion gap: 9 (ref 5–15)
BUN: 17 mg/dL (ref 6–20)
CO2: 26 mmol/L (ref 22–32)
Calcium: 9.1 mg/dL (ref 8.9–10.3)
Chloride: 102 mmol/L (ref 98–111)
Creatinine, Ser: 0.68 mg/dL (ref 0.44–1.00)
GFR, Estimated: >60 mL/min (ref >60)
Glucose, Bld: 100 mg/dL — ABNORMAL HIGH (ref 70–99)
Potassium: 4 mmol/L (ref 3.5–5.1)
Sodium: 137 mmol/L (ref 135–145)

## 2021-08-31 LAB — CBC
HCT: 31.6 % — ABNORMAL LOW (ref 36.0–46.0)
Hemoglobin: 9.3 g/dL — ABNORMAL LOW (ref 12.0–15.0)
MCH: 23.1 pg — ABNORMAL LOW (ref 26.0–34.0)
MCHC: 29.4 g/dL — ABNORMAL LOW (ref 30.0–36.0)
MCV: 78.6 fL — ABNORMAL LOW (ref 80.0–100.0)
Platelets: 220 10*3/uL (ref 150–400)
RBC: 4.02 MIL/uL (ref 3.87–5.11)
RDW: 21.2 % — ABNORMAL HIGH (ref 11.5–15.5)
WBC: 10.9 10*3/uL — ABNORMAL HIGH (ref 4.0–10.5)
nRBC: 0 % (ref 0.0–0.2)

## 2021-08-31 LAB — MAGNESIUM: Magnesium: 2.2 mg/dL (ref 1.7–2.4)

## 2021-08-31 MED ORDER — NALOXONE HCL 0.4 MG/ML IJ SOLN
INTRAMUSCULAR | Status: AC
  Filled 2021-08-31: qty 1

## 2021-08-31 MED ORDER — NALOXONE HCL 0.4 MG/ML IJ SOLN: 0.4000 mg | INTRAMUSCULAR | Status: DC | PRN

## 2021-08-31 MED ORDER — LORAZEPAM 0.5 MG PO TABS: 0.5000 mg | ORAL_TABLET | Freq: Four times a day (QID) | ORAL | Status: DC | PRN

## 2021-08-31 MED ORDER — SENNOSIDES-DOCUSATE SODIUM 8.6-50 MG PO TABS
1.0000 | ORAL_TABLET | Freq: Every day | ORAL | Status: DC
  Administered 2021-08-31 – 2021-09-17 (×9): 1 via ORAL
  Filled 2021-08-31 (×11): qty 1

## 2021-08-31 MED ORDER — HYDROMORPHONE HCL 1 MG/ML IJ SOLN
0.5000 mg | INTRAMUSCULAR | Status: DC | PRN
  Administered 2021-08-31: 0.5 mg via INTRAVENOUS
  Filled 2021-08-31: qty 0.5

## 2021-08-31 MED ORDER — PERMETHRIN 1 % EX LOTN
TOPICAL_LOTION | Freq: Once | CUTANEOUS | Status: AC
  Filled 2021-08-31: qty 59

## 2021-08-31 MED ORDER — PERMETHRIN 5 % EX CREA
TOPICAL_CREAM | Freq: Once | CUTANEOUS | Status: AC
  Filled 2021-08-31: qty 60

## 2021-08-31 NOTE — Progress Notes (Signed)
08/31/2021 ?1800 ?Lice protocol started.  All personal belongings have been placed in double bags.  All loose linen placed in double linen bags and laundry has been called.  All trash has been placed in double bags also.  Waiting on Lice kit from Pharmacy to be delivered pt will be showered and bed linens changed. ?Brion Aliment C ? ? ? ?

## 2021-08-31 NOTE — Code Documentation (Addendum)
Responded to Code Blue call. Report from RN that patient was found non-responsive, in respiratory arrest and cyanotic. Narcan was administered at bedside with rapid recovery of consciousness. No compressions, ACLS meds, or bagging was administered. ?At time of assessment patient is awake and alert with markedly dilated pupils. Reports that she recently had a visitor to the room who brought her a bag of heroin which she snorted. Patient is breathing spontaneously and maintaining O2 sats on room air.  ?- Keep Narcan at bedside ?- Sitter ordered for illicit substance use/observation ?- Visitors restricted ?- Rapid UDS ?- Primary team notified ? ?Dorothyann Gibbs, MD ? ?

## 2021-08-31 NOTE — Progress Notes (Signed)
Bedside RN reports, patient currently having lice all over her body.  A single [id number] hour application of permethrin 5% cream ordered to apply to the entire body. ?

## 2021-08-31 NOTE — Significant Event (Signed)
Rapid Response Event Note  ? ?Reason for Call : CODE BLUE ? ? ?Initial Focused Assessment: On arrival, patient is alert and oriented. Pupils are a 5, equal and reactive. Lungs are clear bilaterally. Abdomen is soft and non-tender. Skin is warm and dry. Pt recently had a visitor in room, and was found unresponsive, cyanotic, and in respiratory distress shortly after visitor left. Pt never lost pulse but Narcan was immediately given with quick recovery. Pt later states that visitor brought Heroin to hospital which patient snorted so pt could "get high." She denies intentional overdose and says "this has never happened before." ? ?VS: BP 91/56, HR 101, RR 17, O2 100 on 5L [place] ? ? ?Interventions:  ?-PICC line cleared by wasting 10cc of blood and flushing with 10cc of saline in each lumen. ? ?Plan of Care:  ?-Visitor restrictions ?-Wean oxygen requirements as able ?-Tele sitter to be placed ?-PRN Narcan ordered per MD ? ?Event Summary:  ? ?MD Notified: Dr. Margo Aye at bedside ?Call Time: 1439 ?Arrival Time: 1441 ?End Time: 1507 ? ?Lowella Petties, RN ?

## 2021-08-31 NOTE — Progress Notes (Signed)
08/31/2021 ?1430 ?Student nurse in to check on pt because a friend had just visited and apparently had 2 bags with him when he entered pts room.  Pt did not respond to student so he called out for help.  Pt was unresponsive when nurse entered room, not responding to sternal rub, eyes rolled back.  Pts color became cyanotic.  Oxygen applied.  Code called, Rapid had already been notified prior to calling code.  Pt still with pulse, Narcan .4  given.  Pt immediately responded to the narcan treatment.  When pt realized where she was, who she was she was asked what happened.  She finally confessed to snorting heroin.  Nurse and student stayed with pt for awhile and explained that she would no longer be able to have visitors.  Tele sitter was ordered and placed in room. While taking time with the pt student discovered lice in the pt's hair.  MD notified.  Orders received for lice treatment and protocol started.  ?Latoya [name] ? ?

## 2021-08-31 NOTE — Progress Notes (Addendum)
? ?PROGRESS NOTE ? ?Latoya [name] JEH:[id number] DOB: 1985-07-18 DOA: 08/02/2021 ?PCP: Pcp, No ? ?HPI/Recap of past 24 hours: ?Patient is a 36 years old female with past medical history of MSSA tricuspid valve endocarditis in 2020 as well as MRSA discitis and osteomyelitis in 2021 with ongoing IV drug abuse presented to hospital with low back pain, cough and fever.  MRI of the lumbar spine was negative for infection but blood culture was positive for MRSA bacteremia with tricuspid valve vegetation and septic pulmonary emboli.  Patient was seen by CT surgery and underwent angio VAC  procedure, but the discovery of a significant PFO required the abortion of this procedure.  She has been seen by IDA and plan is to continue antibiotic until 09/16/2021 and to repeat TTE about 5 to 7 days prior to discontinuation of antibiotics. ?  ?08/31/2021: Patient was seen and examined at bedside.  This morning she complained of mid lower back pain.   ? ?Later on, in the afternoon patient had a visitor to the room who brought her bag of heroin which she snorted.  Bedside RN found the patient unresponsive, in respiratory arrest and cyanotic.  Narcan was administered at bedside with rapid recovery of consciousness.  There were no compressions or ACLS medications administered.  Return to bedside the patient is alert and oriented.  Visitor restrictions applied and narcotics held.  As needed Narcan at bedside.  Will obtain UDS and keep on telemetry. ? ?Assessment/Plan: ?Principal Problem: ?  MRSA bacteremia ?Active Problems: ?  Endocarditis of tricuspid valve ?  History of bacterial endocarditis ?  Iron deficiency anemia ?  IV drug abuse (HCC) ?  Opioid use disorder ?  Septic pulmonary embolism (HCC) ? ?MRSA bacteremia, tricuspid valve endocarditis, septic pulmonary emboli ?Severe sepsis on presentation.  Sepsis physiology has improved.  Plan is to continue vancomycin through 09/16/2021.  Plan to repeat TTE 5 to 7 days prior to  discontinuation of antibiotics ? ? IV drug abuse ?Emphasized cessation  ?08/31/21: Patient had a visitor to the room who brought her bag of heroin which she snorted.  Bedside RN found the patient unresponsive, in respiratory arrest and cyanotic.  Narcan was administered at bedside with rapid recovery of consciousness.  There were no compressions or ACLS medications administered.  Return to bedside the patient is alert and oriented.  Visitor restrictions applied and narcotics held.  As needed Narcan at bedside.  Will obtain UDS and keep on telemetry.  Obtain 12 lead EKG. ? ?Chronic lumbar pain, history of lumbar osteomyelitis ?-MRI of the lumbosacral spine this time which showed no evidence of discitis or osteomyelitis.   ?-Continue oxycodone, Lyrica, baclofen, Cymbalta, Dilaudid, ibuprofen. Scheduled oxycodone in effort to wean down/off on dilaudid. ? ?Physical debility ?Physical therapy Occupational Therapy recommending outpatient PT OT at this time. ?Fall precautions ? ? ? ?  ?DVT prophylaxis: on Xarelto for ppx as she refused lovenox  ? ?  ?Family Communication:  ?Spoke with the patient at bedside. ?  ?Disposition Plan: Home ?  ?Status is: Inpatient ?  ?Remains inpatient appropriate because: on IV antibiotics,  ?  ?Antimicrobials:  ?IV vancomycin>> 09/16/2021. ?  ? ? ? ?Status is: Inpatient ?Patient requires at least 2 midnights for further evaluation and treatment of present condition. ? ? ? ?Objective: ?Vitals:  ? 08/31/21 0331 08/31/21 0722 08/31/21 1045 08/31/21 1456  ?BP: 105/79 (!) 93/59 120/81 (!) 91/56  ?Pulse: 88 87 95 (!) 102  ?Resp: 20 20 20 17   ?Temp:  98.4 ?F (36.9 ?C) 98.6 ?F (37 ?C) 98.1 ?F (36.7 ?C)   ?TempSrc: Oral Oral Oral   ?SpO2: 95% 98% 98% 100%  ?Weight:      ?Height:      ? ? ?Intake/Output Summary (Last 24 hours) at 08/31/2021 1510 ?Last data filed at 08/31/2021 1049 ?Gross per 24 hour  ?Intake 587 ml  ?Output 2620 ml  ?Net -2033 ml  ? ?Filed Weights  ? 08/03/21 1036 08/08/21 1111 08/09/21  1607  ?Weight: 82.2 kg 82.2 kg 87.5 kg  ? ? ?Exam: ? ?General: 36 y.o. year-old female well developed well nourished in no acute distress.  Alert and oriented x3. ?Cardiovascular: Regular rate and rhythm with no rubs or gallops.  No thyromegaly or JVD noted.   ?Respiratory: Clear to auscultation with no wheezes or rales. Good inspiratory effort. ?Abdomen: Soft nontender nondistended with normal bowel sounds x4 quadrants. ?Musculoskeletal: Trace lower extremity edema. ?Skin: No ulcerative lesions noted or rashes, ?Psychiatry: Mood is appropriate for condition and setting ? ? ?Data Reviewed: ?CBC: ?Recent Labs  ?Lab 08/28/21 ?[id number] 08/31/21 ?0345  ?WBC 9.4 10.9*  ?HGB 9.0* 9.3*  ?HCT 29.4* 31.6*  ?MCV 79.5* 78.6*  ?PLT 228 220  ? ?Basic Metabolic Panel: ?Recent Labs  ?Lab 08/25/21 ?0510 08/28/21 ?[id number] 08/31/21 ?0345  ?NA 141 137 137  ?K 4.1 3.7 4.0  ?CL 108 105 102  ?CO2 27 27 26   ?GLUCOSE 106* 120* 100*  ?BUN 22* 14 17  ?CREATININE 0.78 0.70 0.68  ?CALCIUM 8.5* 8.6* 9.1  ?MG  --   --  2.2  ? ?GFR: ?Estimated Creatinine Clearance: 110.4 mL/min (by C-G formula based on SCr of 0.68 mg/dL). ?Liver Function Tests: ?No results for input(s): AST, ALT, ALKPHOS, BILITOT, PROT, ALBUMIN in the last 168 hours. ?No results for input(s): LIPASE, AMYLASE in the last 168 hours. ?No results for input(s): AMMONIA in the last 168 hours. ?Coagulation Profile: ?No results for input(s): INR, PROTIME in the last 168 hours. ?Cardiac Enzymes: ?No results for input(s): CKTOTAL, CKMB, CKMBINDEX, TROPONINI in the last 168 hours. ?BNP (last 3 results) ?No results for input(s): PROBNP in the last 8760 hours. ?HbA1C: ?No results for input(s): HGBA1C in the last 72 hours. ?CBG: ?Recent Labs  ?Lab 08/31/21 ?1443  ?GLUCAP 178*  ? ?Lipid Profile: ?No results for input(s): CHOL, HDL, LDLCALC, TRIG, CHOLHDL, LDLDIRECT in the last 72 hours. ?Thyroid Function Tests: ?No results for input(s): TSH, T4TOTAL, FREET4, T3FREE, THYROIDAB in the last 72  hours. ?Anemia Panel: ?No results for input(s): VITAMINB12, FOLATE, FERRITIN, TIBC, IRON, RETICCTPCT in the last 72 hours. ?Urine analysis: ?   ?Component Value Date/Time  ? COLORURINE YELLOW 08/02/2021 2028  ? APPEARANCEUR HAZY (A) 08/02/2021 2028  ? LABSPEC 1.012 08/02/2021 2028  ? PHURINE 6.0 08/02/2021 2028  ? GLUCOSEU NEGATIVE 08/02/2021 2028  ? HGBUR SMALL (A) 08/02/2021 2028  ? BILIRUBINUR NEGATIVE 08/02/2021 2028  ? BILIRUBINUR negative 06/21/2021 1755  ? KETONESUR NEGATIVE 08/02/2021 2028  ? PROTEINUR 30 (A) 08/02/2021 2028  ? UROBILINOGEN 0.2 06/21/2021 1755  ? NITRITE NEGATIVE 08/02/2021 2028  ? LEUKOCYTESUR SMALL (A) 08/02/2021 2028  ? ?Sepsis Labs: ?@LABRCNTIP (procalcitonin:4,lacticidven:4) ? ?)No results found for this or any previous visit (from the past 240 hour(s)).  ? ? ?Studies: ?No results found. ? ?Scheduled Meds: ? baclofen  10 mg Oral Q6H  ? Chlorhexidine Gluconate Cloth  6 each Topical Daily  ? DULoxetine  40 mg Oral Daily  ? feeding supplement  1 Container Oral BID BM  ?  feeding supplement  237 mL Oral Q24H  ? ferrous gluconate  324 mg Oral Q breakfast  ? Gerhardt's butt cream   Topical BID  ? magnesium oxide  400 mg Oral BID  ? multivitamin with minerals  1 tablet Oral Daily  ? naloxone      ? naloxone      ? nicotine  21 mg Transdermal Daily  ? pantoprazole  40 mg Oral Daily  ? pregabalin  75 mg Oral TID  ? rivaroxaban  10 mg Oral Q supper  ? senna-docusate  1 tablet Oral Daily  ? thiamine  100 mg Oral Daily  ? ? ?Continuous Infusions: ? lactated ringers Stopped (08/11/21 1336)  ? vancomycin Stopped (08/30/21 2322)  ? ? ? LOS: 29 days  ? ? ? ?Darlin Drop, MD ?[facility] ?Pager [phone] ? ?If 7PM-7AM, please contact night-coverage [email or web address] ?Password [email or web address] ?08/31/2021, 3:10 PM  ?  ?

## 2021-08-31 NOTE — Progress Notes (Signed)
PT Cancellation Note ? ?Patient Details ?Name: Latoya [name] ?MRN: [id number] ?DOB: 1985-08-11 ? ? ?Cancelled Treatment:    Reason Eval/Treat Not Completed: (P) Medical issues which prohibited therapy (per chart review, pt with recent code blue due to overdose, will defer PT tx until medically appropriate.)  ? ? ?Dorathy Kinsman Aanshi Batchelder ?08/31/2021, 2:59 PM ? ? ?

## 2021-08-31 NOTE — Progress Notes (Signed)
Responded to call to patient room. Patient has working DL PICC line. Tomasita Morrow, RN VAST ?

## 2021-09-01 DIAGNOSIS — B9562 Methicillin resistant Staphylococcus aureus infection as the cause of diseases classified elsewhere: Secondary | ICD-10-CM | POA: Diagnosis not present

## 2021-09-01 DIAGNOSIS — R7881 Bacteremia: Secondary | ICD-10-CM | POA: Diagnosis not present

## 2021-09-01 LAB — RAPID URINE DRUG SCREEN, HOSP PERFORMED
Amphetamines: NOT DETECTED
Barbiturates: NOT DETECTED
Benzodiazepines: NOT DETECTED
Cocaine: NOT DETECTED
Opiates: POSITIVE — AB
Tetrahydrocannabinol: NOT DETECTED

## 2021-09-01 MED ORDER — OXYCODONE HCL 5 MG PO TABS
5.0000 mg | ORAL_TABLET | Freq: Four times a day (QID) | ORAL | Status: DC | PRN
  Administered 2021-09-01 – 2021-09-10 (×33): 10 mg via ORAL
  Filled 2021-09-01 (×33): qty 2

## 2021-09-01 MED ORDER — HYDROMORPHONE HCL 1 MG/ML IJ SOLN: 0.5000 mg | INTRAMUSCULAR | Status: DC | PRN

## 2021-09-01 NOTE — Progress Notes (Signed)
Nutrition Follow-up ? ?DOCUMENTATION CODES:  ? ?Not applicable ? ?INTERVENTION:  ? ?Continue Boost Breeze po BID, each supplement provides 250 kcal and 9 grams of protein. ? ?Continue Ensure Enlive po once daily, each supplement provides 350 kcal and 20 grams of protein. ? ?Continue MVI with minerals daily. ? ?Will sign off. RD available by consult if nutrition concerns arise.  ? ?NUTRITION DIAGNOSIS:  ? ?Increased nutrient needs related to acute illness as evidenced by estimated needs. ? ?Ongoing  ? ?GOAL:  ? ?Patient will meet greater than or equal to 90% of their needs ? ?Met ? ?MONITOR:  ? ?PO intake, Supplement acceptance, Labs, Weight trends ? ?REASON FOR ASSESSMENT:  ? ?Consult ?Assessment of nutrition requirement/status ? ?ASSESSMENT:  ? ?36 y.o. 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 She presented to the ED with severe low back pain for several weeks and intermittent fevers at home. She was admitted with severe sepsis and endocarditis of tricuspid valve. ? ?PICC line in place for prolonged antibiotic therapy. ?Patient to remain inpatient until finished with IV antibiotics.  ?  ?Patient is on a regular diet. ?Supplements: Ensure Enlive/Plus once daily; Boost Breeze BID. ?Meal intakes documented at 50-100%. ? ?Patient states she is eating well and drinking supplements twice daily. ? ?Labs reviewed.  ?Medications reviewed and include ferrous gluconate, Mag-Ox, MVI with minerals, Senokot-S, thiamine. ? ?Diet Order:   ?Diet Order   ? ?       ?  Diet regular Room service appropriate? Yes; Fluid consistency: Thin  Diet effective now       ?  ? ?  ?  ? ?  ? ? ?EDUCATION NEEDS:  ? ?Not appropriate for education at this time ? ?Skin:  Skin Assessment: Reviewed RN Assessment ? ?Last BM:  4/24 type 3 ? ?Height:  ? ?Ht Readings from Last 1 Encounters:  ?08/08/21  '5\' 6"'  (1.676 m)  ? ? ?Weight:  ? ?Wt Readings from Last 1 Encounters:  ?08/09/21 87.5 kg  ? ? ?Ideal Body Weight:  59.1 kg ? ?BMI:  Body mass index is 31.14 kg/m?. ? ?Estimated Nutritional Needs:  ? ?Kcal:  2100-2400 kcal ? ?Protein:  105-120 grams ? ?Fluid:  >/= 2.3 L/day ? ? ? ?Lucas Mallow RD, LDN, CNSC ?Please refer to Amion for contact information.                                                       ? ?

## 2021-09-01 NOTE — Progress Notes (Signed)
? ?PROGRESS NOTE ? ?Yates DecampHeather Hearst ZOX:[id number] DOB: 1986/01/20 DOA: 08/02/2021 ?PCP: Pcp, No ? ?HPI/Recap of past 24 hours: ?Patient is a 36 years old female with past medical history of MSSA tricuspid valve endocarditis in 2020 as well as MRSA discitis and osteomyelitis in 2021 with ongoing IV drug abuse presented to hospital with low back pain, cough and fever.  MRI of the lumbar spine was negative for infection but blood culture was positive for MRSA bacteremia with tricuspid valve vegetation and septic pulmonary emboli.  Patient was seen by CT surgery and underwent angio VAC  procedure, but the discovery of a significant PFO required the abortion of this procedure.  She has been seen by IDA and plan is to continue antibiotic until 09/16/2021 and to repeat TTE about 5 to 7 days prior to discontinuation of antibiotics. ?  ?08/31/2021: Patient was seen and examined at bedside.  This morning she complained of mid lower back pain.   ? ?Later on, in the afternoon patient had a visitor to the room who brought her bag of heroin which she snorted.  Bedside RN found the patient unresponsive, in respiratory arrest and cyanotic.  Narcan was administered at bedside with rapid recovery of consciousness.  There were no compressions or ACLS medications administered.  Return to bedside the patient is alert and oriented.  Visitor restrictions applied and narcotics held.  As needed Narcan at bedside.  Will obtain UDS and keep on telemetry. ? ?09/01/21: States she hurts all over. ? ?Assessment/Plan: ?Principal Problem: ?  MRSA bacteremia ?Active Problems: ?  Endocarditis of tricuspid valve ?  History of bacterial endocarditis ?  Iron deficiency anemia ?  IV drug abuse (HCC) ?  Opioid use disorder ?  Septic pulmonary embolism (HCC) ? ?MRSA bacteremia, tricuspid valve endocarditis, septic pulmonary emboli ?Severe sepsis on presentation.  Sepsis physiology has improved.  Plan is to continue vancomycin through 09/16/2021.  Plan to repeat  TTE 5 to 7 days prior to discontinuation of antibiotics ? ? IV drug abuse ?Emphasized cessation  ?08/31/21: Patient had a visitor to the room who brought her bag of heroin which she snorted.  Bedside RN found the patient unresponsive, in respiratory arrest and cyanotic.  Narcan was administered at bedside with rapid recovery of consciousness.  There were no compressions or ACLS medications administered.  Return to bedside the patient is alert and oriented.  Visitor restrictions applied and narcotics held.  As needed Narcan at bedside.   ?UDS positive for opiates ? ?Chronic lumbar pain, history of lumbar osteomyelitis ?-MRI of the lumbosacral spine this time which showed no evidence of discitis or osteomyelitis.   ?-Continue oxycodone, Lyrica, baclofen, Cymbalta, Dilaudid, ibuprofen. Scheduled oxycodone in effort to wean down/off on dilaudid. ? ?Physical debility ?Physical therapy Occupational Therapy recommending outpatient PT OT at this time. ?Fall precautions ? ? ? ?  ?DVT prophylaxis: on Xarelto for ppx as she refused lovenox  ? ?  ?Family Communication:  ?Spoke with the patient at bedside. ?  ?Disposition Plan: Home ?  ?Status is: Inpatient ?  ?Remains inpatient appropriate because: on IV antibiotics,  ?  ?Antimicrobials:  ?IV vancomycin>> 09/16/2021. ?  ? ? ? ?Status is: Inpatient ?Patient requires at least 2 midnights for further evaluation and treatment of present condition. ? ? ? ?Objective: ?Vitals:  ? 08/31/21 2309 09/01/21 0409 09/01/21 0800 09/01/21 1209  ?BP: 109/82 (!) 89/62 98/69 112/82  ?Pulse: 96 96 86 99  ?Resp: (!) 21 (!) 21 17 20   ?Temp:  98.2 ?F (36.8 ?C) 98.4 ?F (36.9 ?C) 97.9 ?F (36.6 ?C) 99.4 ?F (37.4 ?C)  ?TempSrc: Oral Axillary Oral Oral  ?SpO2: 97% 97% 97% 98%  ?Weight:      ?Height:      ? ? ?Intake/Output Summary (Last 24 hours) at 09/01/2021 1438 ?Last data filed at 09/01/2021 0608 ?Gross per 24 hour  ?Intake 587 ml  ?Output 600 ml  ?Net -13 ml  ? ?Filed Weights  ? 08/03/21 1036 08/08/21  1111 08/09/21 1607  ?Weight: 82.2 kg 82.2 kg 87.5 kg  ? ? ?Exam: No significant change from prior exam ? ?General: 36 y.o. year-old female well developed well nourished in no acute distress.  Alert and oriented x3. ?Cardiovascular: Regular rate and rhythm with no rubs or gallops.  No thyromegaly or JVD noted.   ?Respiratory: Clear to auscultation with no wheezes or rales. Good inspiratory effort. ?Abdomen: Soft nontender nondistended with normal bowel sounds x4 quadrants. ?Musculoskeletal: Trace lower extremity edema. ?Skin: No ulcerative lesions noted or rashes, ?Psychiatry: Mood is appropriate for condition and setting ? ? ?Data Reviewed: ?CBC: ?Recent Labs  ?Lab 08/28/21 ?3662 08/31/21 ?0345  ?WBC 9.4 10.9*  ?HGB 9.0* 9.3*  ?HCT 29.4* 31.6*  ?MCV 79.5* 78.6*  ?PLT 228 220  ? ?Basic Metabolic Panel: ?Recent Labs  ?Lab 08/28/21 ?9476 08/31/21 ?0345  ?NA 137 137  ?K 3.7 4.0  ?CL 105 102  ?CO2 27 26  ?GLUCOSE 120* 100*  ?BUN 14 17  ?CREATININE 0.70 0.68  ?CALCIUM 8.6* 9.1  ?MG  --  2.2  ? ?GFR: ?Estimated Creatinine Clearance: 110.4 mL/min (by C-G formula based on SCr of 0.68 mg/dL). ?Liver Function Tests: ?No results for input(s): AST, ALT, ALKPHOS, BILITOT, PROT, ALBUMIN in the last 168 hours. ?No results for input(s): LIPASE, AMYLASE in the last 168 hours. ?No results for input(s): AMMONIA in the last 168 hours. ?Coagulation Profile: ?No results for input(s): INR, PROTIME in the last 168 hours. ?Cardiac Enzymes: ?No results for input(s): CKTOTAL, CKMB, CKMBINDEX, TROPONINI in the last 168 hours. ?BNP (last 3 results) ?No results for input(s): PROBNP in the last 8760 hours. ?HbA1C: ?No results for input(s): HGBA1C in the last 72 hours. ?CBG: ?Recent Labs  ?Lab 08/31/21 ?1443  ?GLUCAP 178*  ? ?Lipid Profile: ?No results for input(s): CHOL, HDL, LDLCALC, TRIG, CHOLHDL, LDLDIRECT in the last 72 hours. ?Thyroid Function Tests: ?No results for input(s): TSH, T4TOTAL, FREET4, T3FREE, THYROIDAB in the last 72  hours. ?Anemia Panel: ?No results for input(s): VITAMINB12, FOLATE, FERRITIN, TIBC, IRON, RETICCTPCT in the last 72 hours. ?Urine analysis: ?   ?Component Value Date/Time  ? COLORURINE YELLOW 08/02/2021 2028  ? APPEARANCEUR HAZY (A) 08/02/2021 2028  ? LABSPEC 1.012 08/02/2021 2028  ? PHURINE 6.0 08/02/2021 2028  ? GLUCOSEU NEGATIVE 08/02/2021 2028  ? HGBUR SMALL (A) 08/02/2021 2028  ? BILIRUBINUR NEGATIVE 08/02/2021 2028  ? BILIRUBINUR negative 06/21/2021 1755  ? KETONESUR NEGATIVE 08/02/2021 2028  ? PROTEINUR 30 (A) 08/02/2021 2028  ? UROBILINOGEN 0.2 06/21/2021 1755  ? NITRITE NEGATIVE 08/02/2021 2028  ? LEUKOCYTESUR SMALL (A) 08/02/2021 2028  ? ?Sepsis Labs: ?@LABRCNTIP (procalcitonin:4,lacticidven:4) ? ?)No results found for this or any previous visit (from the past 240 hour(s)).  ? ? ?Studies: ?No results found. ? ?Scheduled Meds: ? baclofen  10 mg Oral Q6H  ? Chlorhexidine Gluconate Cloth  6 each Topical Daily  ? DULoxetine  40 mg Oral Daily  ? feeding supplement  1 Container Oral BID BM  ? feeding supplement  237 mL Oral Q24H  ? ferrous gluconate  324 mg Oral Q breakfast  ? Gerhardt's butt cream   Topical BID  ? magnesium oxide  400 mg Oral BID  ? multivitamin with minerals  1 tablet Oral Daily  ? nicotine  21 mg Transdermal Daily  ? pantoprazole  40 mg Oral Daily  ? pregabalin  75 mg Oral TID  ? rivaroxaban  10 mg Oral Q supper  ? senna-docusate  1 tablet Oral Daily  ? thiamine  100 mg Oral Daily  ? ? ?Continuous Infusions: ? lactated ringers Stopped (08/11/21 1336)  ? vancomycin Stopped (09/01/21 0104)  ? ? ? LOS: 30 days  ? ? ? ?Darlin Drop, MD ?[facility] ?Pager [phone] ? ?If 7PM-7AM, please contact night-coverage [email or web address] ?Password [email or web address] ?09/01/2021, 2:38 PM  ?  ?

## 2021-09-01 NOTE — Progress Notes (Signed)
Physical Therapy Treatment ?Patient Details ?Name: Latoya [name] ?MRN: [id number] ?DOB: December 12, 1985 ?Today's Date: 09/01/2021 ? ? ?History of Present Illness 36 y.o. female admitted 08/02/21 with back pain, hypotension, hyponatremia, leukocytosis and neutrophilia. Found to have bacteremia possible sepitc pulmonary emboli from endocarditis. PMH: IVDU, heroin abuse, ongoing. ? ?  ?PT Comments  ? ? Pt received in supine, eager to get OOB to [facility] and agreeable to limited participation in functional mobility tasks with encouragement this date. Pt defers therapeutic exercises due to pain/fatigue, able to perform sit<>stand and stand pivot transfers with up to min guard and increased time to perform with heavy reliance on RW for support. Pt agreeable to sitting up in chair with encouragement at end of session, RN/NT notified her bed linens were removed and placed in linen bag in room and linens/bed may need special handling due to lice infestation. VSS on RA throughout. Pt continues to benefit from PT services to progress toward functional mobility goals.     ?Recommendations for follow up therapy are one component of a multi-disciplinary discharge planning process, led by the attending physician.  Recommendations may be updated based on patient status, additional functional criteria and insurance authorization. ? ?Follow Up Recommendations ? Outpatient PT ?  ?  ?Assistance Recommended at Discharge Intermittent Supervision/Assistance  ?Patient can return home with the following A little help with walking and/or transfers;A little help with bathing/dressing/bathroom;Assistance with cooking/housework;Direct supervision/assist for medications management;Assist for transportation;Help with stairs or ramp for entrance ?  ?Equipment Recommendations ? Rolling walker (2 wheels);BSC/3in1  ?  ?Recommendations for Other Services   ? ? ?  ?Precautions / Restrictions Precautions ?Precautions: Fall;Back ?Precaution Booklet Issued:  No ?Precaution Comments: Contact precs, lice, back precs for comfort due to pain ?Required Braces or Orthoses: Spinal Brace ?Spinal Brace: Lumbar corset ?Restrictions ?Weight Bearing Restrictions: No  ?  ? ?Mobility ? Bed Mobility ?Overal bed mobility: Needs Assistance ?Bed Mobility: Supine to Sit ?  ?  ?Supine to sit: Supervision, HOB elevated ?  ?  ?General bed mobility comments: slow pace, pt needing HOB ~30 deg for comfort, unable to tolerate log rolling as this exacerbates her pain. Increased time/effort to perform ?  ? ?Transfers ?Overall transfer level: Needs assistance ?Equipment used: Rolling walker (2 wheels) ?Transfers: Sit to/from Stand ?Sit to Stand: Min guard ?  ?Step pivot transfers: Min guard ?  ?  ?  ?General transfer comment: min guard for safety due to pt c/o increased LBP and with previous day code, pt without LOB but with small shuffled steps today due to pain. From EOB>BSC>recliner ?  ? ?Ambulation/Gait ?  ?  ?  ?  ?  ?  ?  ?General Gait Details: defer due to pt c/o pain/fatigue and refusing ? ? ?Stairs ?  ?  ?  ?  ?  ? ? ?Wheelchair Mobility ?  ? ?Modified Rankin (Stroke Patients Only) ?  ? ? ?  ?Balance Overall balance assessment: Needs assistance ?Sitting-balance support: No upper extremity supported, Feet supported ?Sitting balance-Leahy Scale: Good ?Sitting balance - Comments: No LOB sitting EOB without UE support ?  ?Standing balance support: Bilateral upper extremity supported, During functional activity ?Standing balance-Leahy Scale: Fair ?Standing balance comment: Pt able to perform her own peri-care standing in front/back after toileting, min guard for safety when unsupported, reliant on RW with dynamic standing tasks. ?  ?  ?  ?  ?  ?  ?  ?  ?  ?  ?  ?  ? ?  ?  Cognition Arousal/Alertness: Awake/alert ?Behavior During Therapy: [facility] for tasks assessed/performed, Anxious ?Overall Cognitive Status: Within Functional Limits for tasks assessed ?Area of Impairment: Safety/judgement ?  ?  ?  ?   ?  ?  ?  ?  ?  ?  ?  ?  ?Safety/Judgement: Decreased awareness of safety ?  ?  ?General Comments: Pt anxious re: mobility and after transfer to/from EOB>BSC>recliner, pt deferred further mobility. per chart review pt with opioid overdose in hospital previous day, VSS on RA and pt following 1 and 2-step cues well. ?  ?  ? ?  ?Exercises Other Exercises ?Other Exercises: pt defers today due to pain/fatigue ? ?  ?General Comments General comments (skin integrity, edema, etc.): HR 90's and SpO2 WFL on RA ?  ?  ? ?Pertinent Vitals/Pain Pain Assessment ?Pain Assessment: Faces ?Faces Pain Scale: Hurts whole lot ?Pain Location: LBP with transfers ?Pain Descriptors / Indicators: Grimacing, Guarding, Shooting ?Pain Intervention(s): Limited activity within patient's tolerance, Monitored during session, Repositioned  ? ? ? ?PT Goals (current goals can now be found in the care plan section) Acute Rehab PT Goals ?Patient Stated Goal: To improve, decreased back/sacral pain. ?PT Goal Formulation: With patient ?Time For Goal Achievement: 09/08/21 ?Progress towards PT goals: Progressing toward goals ? ?  ?Frequency ? ? ? Min 3X/week ? ? ? ?  ?PT Plan Current plan remains appropriate  ? ? ?   ?AM-PAC PT "6 Clicks" Mobility   ?Outcome Measure ? Help needed turning from your back to your side while in a flat bed without using bedrails?: A Lot ?Help needed moving from lying on your back to sitting on the side of a flat bed without using bedrails?: A Little ?Help needed moving to and from a bed to a chair (including a wheelchair)?: A Little ?Help needed standing up from a chair using your arms (e.g., wheelchair or bedside chair)?: A Little ?Help needed to walk in hospital room?: A Little ?Help needed climbing 3-5 steps with a railing? : A Lot ?6 Click Score: 16 ? ?  ?End of Session Equipment Utilized During Treatment: Gait belt (pt defers back brace due to urinary urgency) ?Activity Tolerance: Patient limited by pain ?Patient left: with  call bell/phone within reach;in chair;with chair alarm set (pt encouraged to sit up 1 hr) ?Nurse Communication: Mobility status;Other (comment) (old sheets placed in laundry bag but may need special handling due to lice? I left bed unchanged in case it needs to be specially cleaned prior to new linens being placed, encouraged her to sit up 1 hr) ?PT Visit Diagnosis: Other abnormalities of gait and mobility (R26.89);Muscle weakness (generalized) (M62.81);Difficulty in walking, not elsewhere classified (R26.2);Unsteadiness on feet (R26.81);Pain ?Pain - part of body:  (back) ?  ? ? ?Time: [phone] ?PT Time Calculation (min) (ACUTE ONLY): 23 min ? ?Charges:  $Therapeutic Activity: 23-37 mins          ?          ? ?Anaria Kroner P., PTA ?Acute Rehabilitation Services ?Secure Chat Preferred 9a-5:30pm ?Office: [phone]  ? ? ?Latoya [name] ?09/01/2021, 2:06 PM ? ?

## 2021-09-01 NOTE — Progress Notes (Signed)
Pharmacy Antibiotic Note ? ?Latoya [name] is a 36 y.o. female admitted on 08/02/2021 with MRSA bacteremia-  septic pulmonary emboli/endocarditis. Pharmacy has been consulted for Vancomycin dosing.  ? ?SCr remains stable, will check surveillance levels next week. ? ? ?Plan: ?-Continue vancomycin 1750mg  IV q24h ?-Vancomycin levels in a few days ? ? ?Height: 5\' 6"  (167.6 cm) ?Weight: 87.5 kg (192 lb 14.4 oz) ?IBW/kg (Calculated) : 59.3 ? ?Temp (24hrs), Avg:98.2 ?F (36.8 ?C), Min:97.9 ?F (36.6 ?C), Max:98.4 ?F (36.9 ?C) ? ?Recent Labs  ?Lab 08/28/21 ? 08/28/21 ?0900 08/29/21 ?08/30/21 08/31/21 ?0345  ?WBC 9.4  --   --  10.9*  ?CREATININE 0.70  --   --  0.68  ?VANCOTROUGH  --   --  14*  --   ?VANCOPEAK  --  38  --   --   ? ?  ?Estimated Creatinine Clearance: 110.4 mL/min (by C-G formula based on SCr of 0.68 mg/dL).   ? ?Allergies  ?Allergen Reactions  ? Naltrexone Anxiety  ?  Severe anxiety, restlessness, vomiting  ? ? ?Antimicrobials this admission: ?Cefepime 3/29 >> 3/30 ?Flagyl 3/29 >> 3/30 ?Vancomycin 3/29 >> ? ?Dose adjustments this admission: ?4/3-4/4 VP 38 VT 14- AUC 697 >>Adj to 750 q 8 hours  ?4/10: VP 29, VT 15, AUC 591.8, Cmin 12.5 >> 1gm IV q12h for calculated AUC 525.5 ?4/17: VP 30, VT 14, AUC 600 ?4/24: VP 38, VT 14, AUC 655 >> Adj to 1750 q24h ? ?Microbiology results: ?3/29 Influenza/COVID>> neg ?3/29 MRSA PCR >> positive ?3/29 BCx >> GPC in 4/4 bottles (BCID MRSA) ?3/31 BCX: MRSA - only 1 set drawn  ?4/2 BCx: NG ? ? ? ?4/29, PharmD, BCPS, BCCP ?Clinical Pharmacist ?[phone] ?Please check AMION for all [facility] numbers ?09/01/2021 ? ? ? ? ?

## 2021-09-02 DIAGNOSIS — B9562 Methicillin resistant Staphylococcus aureus infection as the cause of diseases classified elsewhere: Secondary | ICD-10-CM | POA: Diagnosis not present

## 2021-09-02 DIAGNOSIS — R7881 Bacteremia: Secondary | ICD-10-CM | POA: Diagnosis not present

## 2021-09-02 NOTE — Progress Notes (Signed)
?PROGRESS NOTE ? ? ? ?Latoya [name]  RKY:[id number] DOB: 06-Aug-1985 DOA: 08/02/2021 ?PCP: Pcp, No ? ? ?Brief Narrative:  ?Patient is a 36 years old female with past medical history of MSSA tricuspid valve endocarditis in 2020 as well as MRSA discitis and osteomyelitis in 2021 with ongoing IV drug abuse presented to hospital with low back pain, cough and fever.  MRI of the lumbar spine was negative for infection but blood culture was positive for MRSA bacteremia with tricuspid valve vegetation and septic pulmonary emboli.  Patient was seen by CT surgery and underwent angio VAC  procedure, but the discovery of a significant PFO required the abortion of this procedure.  She has been seen by IDA and plan is to continue antibiotic until 09/16/2021 and to repeat TTE about 5 to 7 days prior to discontinuation of antibiotics. ?  ?Assessment & Plan: ?  ?Principal Problem: ?  MRSA bacteremia ?Active Problems: ?  Endocarditis of tricuspid valve ?  History of bacterial endocarditis ?  Iron deficiency anemia ?  IV drug abuse (HCC) ?  Opioid use disorder ?  Septic pulmonary embolism (HCC) ? ?MRSA bacteremia, tricuspid valve endocarditis, septic pulmonary emboli ?Severe sepsis on presentation. Sepsis physiology has improved.  ?Plan is to continue vancomycin through 09/16/2021.  ?Plan to repeat TTE 5 to 7 days prior to discontinuation of antibiotics ?  ?IV drug abuse ?Emphasized cessation  ?08/31/21: Patient had a visitor to the room who brought her bag of heroin which she snorted -Found unresponsive by nursing staff required Narcan ?-UDS positive for opiates ? ?Chronic lumbar pain, history of lumbar osteomyelitis ?-MRI of the lumbosacral spine this time which showed no evidence of discitis or osteomyelitis.   ?-Continue oxycodone, Lyrica, baclofen, Cymbalta, Dilaudid, ibuprofen. Scheduled oxycodone in effort to wean down/off on dilaudid. ? ?Physical debility/ambulatory dysfunction, likely secondary to above ?Physical therapy  Occupational Therapy recommending outpatient PT OT at this time. ?Fall precautions ? ?DVT prophylaxis: xarelto(REFUSING LOVENOX) ?Code Status: Full ?Family Communication: None present ? ?Status is: Inpatient ? ?Dispo: The patient is from: Home ?             Anticipated d/c is to: Home ?             Anticipated d/c date is: Pending above ?             Patient currently not medically stable for discharge ? ?Consultants:  ?Infectious disease ? ?Procedures:  ?None ? ?Antimicrobials:  ?Vancomycin ? ?Subjective: ?No acute issues or events overnight ? ?Objective: ?Vitals:  ? 09/01/21 1639 09/01/21 2119 09/02/21 0051 09/02/21 0522  ?BP: (!) 102/59 (!) 92/54 118/84 98/84  ?Pulse: 99 94 94 86  ?Resp: 16 20 (!) 22 19  ?Temp: 97.8 ?F (36.6 ?C) 98.3 ?F (36.8 ?C) 98.5 ?F (36.9 ?C) 98.2 ?F (36.8 ?C)  ?TempSrc: Oral Oral Oral Oral  ?SpO2: 97% 97% 95% 97%  ?Weight:      ?Height:      ? ?No intake or output data in the 24 hours ending 09/02/21 0751 ?Filed Weights  ? 08/03/21 1036 08/08/21 1111 08/09/21 1607  ?Weight: 82.2 kg 82.2 kg 87.5 kg  ? ? ?Examination: ? ?General:  Pleasantly resting in bed, No acute distress. ?HEENT:  Normocephalic atraumatic.  Sclerae nonicteric, noninjected.  Extraocular movements intact bilaterally. ?Neck:  Without mass or deformity.  Trachea is midline. ?Lungs:  Clear to auscultate bilaterally without rhonchi, wheeze, or rales. ?Heart:  Regular rate and rhythm.  Without murmurs, rubs, or gallops. ?  Abdomen:  Soft, nontender, nondistended.  Without guarding or rebound. ?Extremities: Without cyanosis, clubbing, edema, or obvious deformity. ?Vascular:  Dorsalis pedis and posterior tibial pulses palpable bilaterally. ?Skin:  Warm and dry, no erythema ? ?Data Reviewed: I have personally reviewed following labs and imaging studies ? ?CBC: ?Recent Labs  ?Lab 08/28/21 ?3662 08/31/21 ?0345  ?WBC 9.4 10.9*  ?HGB 9.0* 9.3*  ?HCT 29.4* 31.6*  ?MCV 79.5* 78.6*  ?PLT 228 220  ? ?Basic Metabolic Panel: ?Recent Labs  ?Lab  08/28/21 ?9476 08/31/21 ?0345  ?NA 137 137  ?K 3.7 4.0  ?CL 105 102  ?CO2 27 26  ?GLUCOSE 120* 100*  ?BUN 14 17  ?CREATININE 0.70 0.68  ?CALCIUM 8.6* 9.1  ?MG  --  2.2  ? ?GFR: ?Estimated Creatinine Clearance: 110.4 mL/min (by C-G formula based on SCr of 0.68 mg/dL). ?Liver Function Tests: ?No results for input(s): AST, ALT, ALKPHOS, BILITOT, PROT, ALBUMIN in the last 168 hours. ?No results for input(s): LIPASE, AMYLASE in the last 168 hours. ?No results for input(s): AMMONIA in the last 168 hours. ?Coagulation Profile: ?No results for input(s): INR, PROTIME in the last 168 hours. ?Cardiac Enzymes: ?No results for input(s): CKTOTAL, CKMB, CKMBINDEX, TROPONINI in the last 168 hours. ?BNP (last 3 results) ?No results for input(s): PROBNP in the last 8760 hours. ?HbA1C: ?No results for input(s): HGBA1C in the last 72 hours. ?CBG: ?Recent Labs  ?Lab 08/31/21 ?1443  ?GLUCAP 178*  ? ?Lipid Profile: ?No results for input(s): CHOL, HDL, LDLCALC, TRIG, CHOLHDL, LDLDIRECT in the last 72 hours. ?Thyroid Function Tests: ?No results for input(s): TSH, T4TOTAL, FREET4, T3FREE, THYROIDAB in the last 72 hours. ?Anemia Panel: ?No results for input(s): VITAMINB12, FOLATE, FERRITIN, TIBC, IRON, RETICCTPCT in the last 72 hours. ?Sepsis Labs: ?No results for input(s): PROCALCITON, LATICACIDVEN in the last 168 hours. ? ?No results found for this or any previous visit (from the past 240 hour(s)).  ? ?Radiology Studies: ?No results found. ? ?Scheduled Meds: ? baclofen  10 mg Oral Q6H  ? Chlorhexidine Gluconate Cloth  6 each Topical Daily  ? DULoxetine  40 mg Oral Daily  ? feeding supplement  1 Container Oral BID BM  ? feeding supplement  237 mL Oral Q24H  ? ferrous gluconate  324 mg Oral Q breakfast  ? Gerhardt's butt cream   Topical BID  ? magnesium oxide  400 mg Oral BID  ? multivitamin with minerals  1 tablet Oral Daily  ? nicotine  21 mg Transdermal Daily  ? pantoprazole  40 mg Oral Daily  ? pregabalin  75 mg Oral TID  ? rivaroxaban   10 mg Oral Q supper  ? senna-docusate  1 tablet Oral Daily  ? thiamine  100 mg Oral Daily  ? ?Continuous Infusions: ? lactated ringers Stopped (08/11/21 1336)  ? vancomycin 1,750 mg (09/01/21 2139)  ? ? LOS: 31 days  ? ?Time spent: ? ?Azucena Fallen, DO ?[facility] ? ?If 7PM-7AM, please contact night-coverage ?[email or web address] ? ?09/02/2021, 7:51 AM   ? ? ? ?

## 2021-09-03 DIAGNOSIS — B9562 Methicillin resistant Staphylococcus aureus infection as the cause of diseases classified elsewhere: Secondary | ICD-10-CM | POA: Diagnosis not present

## 2021-09-03 DIAGNOSIS — R7881 Bacteremia: Secondary | ICD-10-CM | POA: Diagnosis not present

## 2021-09-03 LAB — VANCOMYCIN, TROUGH: Vancomycin Tr: 5 ug/mL — ABNORMAL LOW (ref 15–20)

## 2021-09-03 LAB — BASIC METABOLIC PANEL
Anion gap: 6 (ref 5–15)
BUN: 18 mg/dL (ref 6–20)
CO2: 25 mmol/L (ref 22–32)
Calcium: 8.4 mg/dL — ABNORMAL LOW (ref 8.9–10.3)
Chloride: 104 mmol/L (ref 98–111)
Creatinine, Ser: 0.68 mg/dL (ref 0.44–1.00)
GFR, Estimated: >60 mL/min (ref >60)
Glucose, Bld: 136 mg/dL — ABNORMAL HIGH (ref 70–99)
Potassium: 3.4 mmol/L — ABNORMAL LOW (ref 3.5–5.1)
Sodium: 135 mmol/L (ref 135–145)

## 2021-09-03 LAB — VANCOMYCIN, PEAK: Vancomycin Pk: 45 ug/mL — ABNORMAL HIGH (ref 30–40)

## 2021-09-03 NOTE — Progress Notes (Signed)
?PROGRESS NOTE ? ? ? ?Latoya [name]  IRS:[id number] DOB: 1985-08-29 DOA: 08/02/2021 ?PCP: Pcp, No ? ? ?Brief Narrative:  ?Patient is a 36 years old female with past medical history of MSSA tricuspid valve endocarditis in 2020 as well as MRSA discitis and osteomyelitis in 2021 with ongoing IV drug abuse presented to hospital with low back pain, cough and fever.  MRI of the lumbar spine was negative for infection but blood culture was positive for MRSA bacteremia with tricuspid valve vegetation and septic pulmonary emboli.  Patient was seen by CT surgery and underwent angio VAC  procedure, but the discovery of a significant PFO required the abortion of this procedure.  She has been seen by IDA and plan is to continue antibiotic until 09/16/2021 and to repeat TTE about 5 to 7 days prior to discontinuation of antibiotics. ?  ?Assessment & Plan: ?  ?Principal Problem: ?  MRSA bacteremia ?Active Problems: ?  Endocarditis of tricuspid valve ?  History of bacterial endocarditis ?  Iron deficiency anemia ?  IV drug abuse (HCC) ?  Opioid use disorder ?  Septic pulmonary embolism (HCC) ? ?MRSA bacteremia, tricuspid valve endocarditis, septic pulmonary emboli ?Severe sepsis on presentation. Sepsis physiology has improved.  ?Plan is to continue vancomycin through 09/16/2021.  ?Plan to repeat TTE 5 to 7 days prior to discontinuation of antibiotics ?  ?IV drug abuse ?Emphasized cessation  ?08/31/21: Patient had a visitor to the room who brought her bag of heroin which she snorted -Found unresponsive by nursing staff required Narcan ?-UDS positive for opiates ? ?Chronic lumbar pain, history of lumbar osteomyelitis ?-MRI of the lumbosacral spine this time which showed no evidence of discitis or osteomyelitis.   ?-Continue oxycodone, Lyrica, baclofen, Cymbalta, Dilaudid, ibuprofen. Scheduled oxycodone in effort to wean down/off on dilaudid. ? ?Physical debility/ambulatory dysfunction, likely secondary to above ?Physical therapy  Occupational Therapy recommending outpatient PT OT at this time. ?Fall precautions ? ?DVT prophylaxis: xarelto(REFUSING LOVENOX) ?Code Status: Full ?Family Communication: None present ? ?Status is: Inpatient ? ?Dispo: The patient is from: Home ?             Anticipated d/c is to: Home ?             Anticipated d/c date is: Pending above ?             Patient currently not medically stable for discharge ? ?Consultants:  ?Infectious disease ? ?Procedures:  ?None ? ?Antimicrobials:  ?Vancomycin ? ?Subjective: ?No acute issues or events overnight ? ?Objective: ?Vitals:  ? 09/02/21 1853 09/02/21 2200 09/03/21 0014 09/03/21 0406  ?BP: 109/78  104/78   ?Pulse: 93 90 97 84  ?Resp: 17 18 18    ?Temp: 98.2 ?F (36.8 ?C) 98.6 ?F (37 ?C) 98.6 ?F (37 ?C) 98.4 ?F (36.9 ?C)  ?TempSrc: Oral Oral Oral Oral  ?SpO2: 97% 98% 98% 98%  ?Weight:      ?Height:      ? ? ?Intake/Output Summary (Last 24 hours) at 09/03/2021 0747 ?Last data filed at 09/03/2021 0029 ?Gross per 24 hour  ?Intake 480 ml  ?Output --  ?Net 480 ml  ? ?Filed Weights  ? 08/03/21 1036 08/08/21 1111 08/09/21 1607  ?Weight: 82.2 kg 82.2 kg 87.5 kg  ? ? ?Examination: ? ?General:  Pleasantly resting in bed, No acute distress. ?HEENT:  Normocephalic atraumatic.  Sclerae nonicteric, noninjected.  Extraocular movements intact bilaterally. ?Neck:  Without mass or deformity.  Trachea is midline. ?Lungs:  Clear to auscultate bilaterally  without rhonchi, wheeze, or rales. ?Heart:  Regular rate and rhythm.  Without murmurs, rubs, or gallops. ?Abdomen:  Soft, nontender, nondistended.  Without guarding or rebound. ?Extremities: Without cyanosis, clubbing, edema, or obvious deformity. ?Vascular:  Dorsalis pedis and posterior tibial pulses palpable bilaterally. ?Skin:  Warm and dry, no erythema ? ?Data Reviewed: I have personally reviewed following labs and imaging studies ? ?CBC: ?Recent Labs  ?Lab 08/28/21 ?5188 08/31/21 ?0345  ?WBC 9.4 10.9*  ?HGB 9.0* 9.3*  ?HCT 29.4* 31.6*  ?MCV 79.5*  78.6*  ?PLT 228 220  ? ? ?Basic Metabolic Panel: ?Recent Labs  ?Lab 08/28/21 ?4166 08/31/21 ?0345 09/03/21 ?0100  ?NA 137 137 135  ?K 3.7 4.0 3.4*  ?CL 105 102 104  ?CO2 27 26 25   ?GLUCOSE 120* 100* 136*  ?BUN 14 17 18   ?CREATININE 0.70 0.68 0.68  ?CALCIUM 8.6* 9.1 8.4*  ?MG  --  2.2  --   ? ? ?GFR: ?Estimated Creatinine Clearance: 110.4 mL/min (by C-G formula based on SCr of 0.68 mg/dL). ?Liver Function Tests: ?No results for input(s): AST, ALT, ALKPHOS, BILITOT, PROT, ALBUMIN in the last 168 hours. ?No results for input(s): LIPASE, AMYLASE in the last 168 hours. ?No results for input(s): AMMONIA in the last 168 hours. ?Coagulation Profile: ?No results for input(s): INR, PROTIME in the last 168 hours. ?Cardiac Enzymes: ?No results for input(s): CKTOTAL, CKMB, CKMBINDEX, TROPONINI in the last 168 hours. ?BNP (last 3 results) ?No results for input(s): PROBNP in the last 8760 hours. ?HbA1C: ?No results for input(s): HGBA1C in the last 72 hours. ?CBG: ?Recent Labs  ?Lab 08/31/21 ?1443  ?GLUCAP 178*  ? ? ?Lipid Profile: ?No results for input(s): CHOL, HDL, LDLCALC, TRIG, CHOLHDL, LDLDIRECT in the last 72 hours. ?Thyroid Function Tests: ?No results for input(s): TSH, T4TOTAL, FREET4, T3FREE, THYROIDAB in the last 72 hours. ?Anemia Panel: ?No results for input(s): VITAMINB12, FOLATE, FERRITIN, TIBC, IRON, RETICCTPCT in the last 72 hours. ?Sepsis Labs: ?No results for input(s): PROCALCITON, LATICACIDVEN in the last 168 hours. ? ?No results found for this or any previous visit (from the past 240 hour(s)).  ? ?Radiology Studies: ?No results found. ? ?Scheduled Meds: ? baclofen  10 mg Oral Q6H  ? Chlorhexidine Gluconate Cloth  6 each Topical Daily  ? DULoxetine  40 mg Oral Daily  ? feeding supplement  1 Container Oral BID BM  ? feeding supplement  237 mL Oral Q24H  ? ferrous gluconate  324 mg Oral Q breakfast  ? Gerhardt's butt cream   Topical BID  ? magnesium oxide  400 mg Oral BID  ? multivitamin with minerals  1 tablet  Oral Daily  ? nicotine  21 mg Transdermal Daily  ? pantoprazole  40 mg Oral Daily  ? pregabalin  75 mg Oral TID  ? rivaroxaban  10 mg Oral Q supper  ? senna-docusate  1 tablet Oral Daily  ? thiamine  100 mg Oral Daily  ? ?Continuous Infusions: ? lactated ringers Stopped (08/11/21 1336)  ? vancomycin 1,750 mg (09/02/21 2204)  ? ? LOS: 32 days  ? ?Time spent: 09/04/21 ? ?2205, DO ?[facility] ? ?If 7PM-7AM, please contact night-coverage ?[email or web address] ? ?09/03/2021, 7:47 AM   ? ? ? ?

## 2021-09-04 DIAGNOSIS — R7881 Bacteremia: Secondary | ICD-10-CM | POA: Diagnosis not present

## 2021-09-04 DIAGNOSIS — B9562 Methicillin resistant Staphylococcus aureus infection as the cause of diseases classified elsewhere: Secondary | ICD-10-CM | POA: Diagnosis not present

## 2021-09-04 DIAGNOSIS — Z419 Encounter for procedure for purposes other than remedying health state, unspecified: Secondary | ICD-10-CM | POA: Diagnosis not present

## 2021-09-04 LAB — CBC
HCT: 30.2 % — ABNORMAL LOW (ref 36.0–46.0)
Hemoglobin: 9.5 g/dL — ABNORMAL LOW (ref 12.0–15.0)
MCH: 24.4 pg — ABNORMAL LOW (ref 26.0–34.0)
MCHC: 31.5 g/dL (ref 30.0–36.0)
MCV: 77.4 fL — ABNORMAL LOW (ref 80.0–100.0)
Platelets: 201 10*3/uL (ref 150–400)
RBC: 3.9 MIL/uL (ref 3.87–5.11)
RDW: 20.5 % — ABNORMAL HIGH (ref 11.5–15.5)
WBC: 11.7 10*3/uL — ABNORMAL HIGH (ref 4.0–10.5)
nRBC: 0 % (ref 0.0–0.2)

## 2021-09-04 NOTE — Progress Notes (Signed)
Physical Therapy Treatment ?Patient Details ?Name: Latoya [name] ?MRN: [id number] ?DOB: 1985/11/28 ?Today's Date: 09/04/2021 ? ? ?History of Present Illness 36 y.o. female admitted 08/02/21 with back pain, hypotension, hyponatremia, leukocytosis and neutrophilia. Found to have bacteremia possible sepitc pulmonary emboli from endocarditis. PMH: IVDU, heroin abuse, ongoing. ? ?  ?PT Comments  ? ? Pt agreeable to participate in therapy session. Able to complete warm up exercises, stretching and ambulating 70 ft with a walker at a supervision level. Continues with BLE weakness (LLE weaker than RLE), gait abnormalities, impaired balance and pain. Will progress as tolerated. ?   ?Recommendations for follow up therapy are one component of a multi-disciplinary discharge planning process, led by the attending physician.  Recommendations may be updated based on patient status, additional functional criteria and insurance authorization. ? ?Follow Up Recommendations ? Outpatient PT ?  ?  ?Assistance Recommended at Discharge Intermittent Supervision/Assistance  ?Patient can return home with the following A little help with walking and/or transfers;A little help with bathing/dressing/bathroom;Assistance with cooking/housework;Direct supervision/assist for medications management;Assist for transportation;Help with stairs or ramp for entrance ?  ?Equipment Recommendations ? Rolling walker (2 wheels);BSC/3in1  ?  ?Recommendations for Other Services   ? ? ?  ?Precautions / Restrictions Precautions ?Precautions: Fall ?Restrictions ?Weight Bearing Restrictions: No  ?  ? ?Mobility ? Bed Mobility ?  ?  ?  ?  ?  ?  ?  ?General bed mobility comments: Sitting EOB ?  ? ?Transfers ?Overall transfer level: Needs assistance ?Equipment used: Rolling walker (2 wheels) ?Transfers: Sit to/from Stand ?Sit to Stand: Min guard ?  ?  ?  ?  ?  ?  ?  ? ?Ambulation/Gait ?Ambulation/Gait assistance: Supervision ?Gait Distance (Feet): 70 Feet ?Assistive  device: Rolling walker (2 wheels) ?Gait Pattern/deviations: Step-to pattern, Shuffle, Decreased dorsiflexion - left, Decreased weight shift to left, Trunk flexed ?Gait velocity: decreased ?Gait velocity interpretation: <1.8 ft/sec, indicate of risk for recurrent falls ?  ?General Gait Details: Cues for heel strike at initial contact. slow pace with step to vs step through pattern. heavy use of arms on walker. supervision for safety ? ? ?Stairs ?  ?  ?  ?  ?  ? ? ?Wheelchair Mobility ?  ? ?Modified Rankin (Stroke Patients Only) ?  ? ? ?  ?Balance Overall balance assessment: Needs assistance ?Sitting-balance support: No upper extremity supported, Feet supported ?Sitting balance-Leahy Scale: Good ?  ?  ?Standing balance support: Bilateral upper extremity supported, During functional activity ?Standing balance-Leahy Scale: Poor ?Standing balance comment: reliant on RW ?  ?  ?  ?  ?  ?  ?  ?  ?  ?  ?  ?  ? ?  ?Cognition Arousal/Alertness: Awake/alert ?Behavior During Therapy: [facility] for tasks assessed/performed, Anxious ?Overall Cognitive Status: Within Functional Limits for tasks assessed ?  ?  ?  ?  ?  ?  ?  ?  ?  ?  ?  ?  ?  ?  ?  ?  ?  ?  ?  ? ?  ?Exercises General Exercises - Lower Extremity ?Long Arc Quad: Both, 10 reps, Seated ?Hip ABduction/ADduction: Both, 10 reps, Seated ?Hip Flexion/Marching: Both, 10 reps, Seated ?Other Exercises ?Other Exercises: Standing: bilateral gastroc stretch x 30 s each ? ?  ?General Comments   ?  ?  ? ?Pertinent Vitals/Pain Pain Assessment ?Pain Assessment: Faces ?Faces Pain Scale: Hurts even more ?Pain Location: back, BLE's ?Pain Descriptors / Indicators: Aching ?Pain Intervention(s): Monitored during session  ? ? ?  Home Living   ?  ?  ?  ?  ?  ?  ?  ?  ?  ?   ?  ?Prior Function    ?  ?  ?   ? ?PT Goals (current goals can now be found in the care plan section) Acute Rehab PT Goals ?Patient Stated Goal: To improve, decreased back/sacral pain. ?Potential to Achieve Goals: Fair ?Progress  towards PT goals: Progressing toward goals ? ?  ?Frequency ? ? ? Min 3X/week ? ? ? ?  ?PT Plan Current plan remains appropriate  ? ? ?Co-evaluation   ?  ?  ?  ?  ? ?  ?AM-PAC PT "6 Clicks" Mobility   ?Outcome Measure ? Help needed turning from your back to your side while in a flat bed without using bedrails?: A Little ?Help needed moving from lying on your back to sitting on the side of a flat bed without using bedrails?: A Little ?Help needed moving to and from a bed to a chair (including a wheelchair)?: A Little ?Help needed standing up from a chair using your arms (e.g., wheelchair or bedside chair)?: A Little ?Help needed to walk in hospital room?: A Little ?Help needed climbing 3-5 steps with a railing? : A Little ?6 Click Score: 18 ? ?  ?End of Session   ?Activity Tolerance: Patient tolerated treatment well ?Patient left: in bed;with call bell/phone within reach ?Nurse Communication: Mobility status ?PT Visit Diagnosis: Other abnormalities of gait and mobility (R26.89);Muscle weakness (generalized) (M62.81);Difficulty in walking, not elsewhere classified (R26.2);Unsteadiness on feet (R26.81);Pain ?  ? ? ?Time: 7510-2585 ?PT Time Calculation (min) (ACUTE ONLY): 26 min ? ?Charges:  $Gait Training: 8-22 mins ?$Therapeutic Activity: 8-22 mins          ?          ? ?[name], PT, DPT ?Acute Rehabilitation Services ?Pager [phone] ?Office [phone] ? ? ? ?[name] ?09/04/2021, 1:29 PM ? ?

## 2021-09-04 NOTE — Progress Notes (Signed)
Occupational Therapy Treatment ?Patient Details ?Name: Latoya [name] ?MRN: [id number] ?DOB: 1985/06/22 ?Today's Date: 09/04/2021 ? ? ?History of present illness 36 y.o. female admitted 08/02/21 with back pain, hypotension, hyponatremia, leukocytosis and neutrophilia. Found to have bacteremia possible sepitc pulmonary emboli from endocarditis. PMH: IVDU, heroin abuse, ongoing. ?  ?OT comments ? This 36 yo female admitted with above presents to acute OT with making progress with getting up and using the toilet in the bathroom instead of the [facility] and managing her own front hygiene. She stood at sink to wash hands but had to brace herself against the sink while she had both hands free from RW. She will continue to benefit from acute OT with follow up at SNF but still needs someone with her 24/7 upon D/C based on today's session.  ? ?Recommendations for follow up therapy are one component of a multi-disciplinary discharge planning process, led by the attending physician.  Recommendations may be updated based on patient status, additional functional criteria and insurance authorization. ?   ?Follow Up Recommendations ? Outpatient OT  ?  ?Assistance Recommended at Discharge Frequent or constant Supervision/Assistance  ?Patient can return home with the following ? A little help with walking and/or transfers;A little help with bathing/dressing/bathroom;Help with stairs or ramp for entrance;Assistance with cooking/housework;Assist for transportation ?  ?Equipment Recommendations ? BSC/3in1  ?  ?   ?Precautions / Restrictions Precautions ?Precautions: Fall ?Precaution Comments: Contact precs, lice, back precs for comfort due to pain ?Required Braces or Orthoses: Spinal Brace ?Spinal Brace: Lumbar corset ?Restrictions ?Weight Bearing Restrictions: No  ? ? ?  ? ?Mobility Bed Mobility ?Overal bed mobility: Needs Assistance ?Bed Mobility: Supine to Sit, Sit to Supine ?  ?  ?Supine to sit: Supervision, HOB elevated (use of rail, has to  shimmey or legs off the the bed) ?Sit to supine: Mod assist, HOB elevated (A for legs) ?  ?General bed mobility comments: Pt says she cannot lay down on her side and and bring her legs up this is more painful than rotating her body/legs to get back in the bed ?  ? ?Transfers ?Overall transfer level: Needs assistance ?Equipment used: Rolling walker (2 wheels) ?Transfers: Sit to/from Stand ?Sit to Stand: Min guard ?  ?  ?  ?  ?  ?  ?  ?  ?Balance Overall balance assessment: Needs assistance ?Sitting-balance support: No upper extremity supported, Feet supported ?Sitting balance-Leahy Scale: Good ?  ?  ?Standing balance support: No upper extremity supported, During functional activity ?Standing balance-Leahy Scale: Fair ?Standing balance comment: standing at sink to wash hands, she did brace up against the cabinet ?  ?  ?  ?  ?  ?  ?  ?  ?  ?  ?  ?   ? ?ADL either performed or assessed with clinical judgement  ? ?ADL Overall ADL's : Needs assistance/impaired ?  ?  ?Grooming: Wash/dry hands;Min guard;Standing ?  ?  ?  ?  ?  ?  ?  ?  ?  ?Toilet Transfer: Min guard;Ambulation;Comfort height toilet;Grab bars ?  ?Toileting- Architect and Hygiene: Min guard;Sit to/from stand ?  ?  ?  ?  ?  ?  ? ?Extremity/Trunk Assessment Upper Extremity Assessment ?Upper Extremity Assessment: Generalized weakness ?  ?  ?  ?  ?  ? ?Vision Baseline Vision/History: 0 No visual deficits ?  ?  ?   ?   ? ?Cognition Arousal/Alertness: Awake/alert ?Behavior During Therapy: [facility] for tasks assessed/performed ?Overall Cognitive  Status: Within Functional Limits for tasks assessed ?  ?  ?  ?  ?  ?  ?  ?  ?  ?  ?  ?  ?  ?  ?  ?  ?  ?  ?  ?   ?   ?   ?   ? ? ?Pertinent Vitals/ Pain       Pain Assessment ?Pain Assessment: Faces ?Faces Pain Scale: Hurts even more ?Pain Descriptors / Indicators: Aching, Sore ?Pain Intervention(s): Limited activity within patient's tolerance, Monitored during session, Repositioned ? ?   ?   ? ?Frequency ? Min  2X/week  ? ? ? ? ?  ?Progress Toward Goals ? ?OT Goals(current goals can now be found in the care plan section) ? Progress towards OT goals: Progressing toward goals ? ?Acute Rehab OT Goals ?OT Goal Formulation: With patient ?Time For Goal Achievement: 09/18/21 ?Potential to Achieve Goals: Good  ?Plan Discharge plan remains appropriate   ? ?   ?AM-PAC OT "6 Clicks" Daily Activity     ?Outcome Measure ? ? Help from another person eating meals?: None ?Help from another person taking care of personal grooming?: A Little ?Help from another person toileting, which includes using toliet, bedpan, or urinal?: A Little ?Help from another person bathing (including washing, rinsing, drying)?: A Lot ?Help from another person to put on and taking off regular upper body clothing?: A Little ?Help from another person to put on and taking off regular lower body clothing?: A Lot ?6 Click Score: 17 ? ?  ?End of Session Equipment Utilized During Treatment: Gait belt;Rolling walker (2 wheels) ? ?OT Visit Diagnosis: Muscle weakness (generalized) (M62.81);Pain ?Pain - Right/Left:  (both) ?Pain - part of body: Leg ?  ?Activity Tolerance Patient tolerated treatment well ?  ?Patient Left in bed;with call bell/phone within reach;with chair alarm set ?  ?Nurse Communication  (pt should be ambulating with them to bathroom during the day, can use 3n1 at night) ?  ? ?   ? ?Time: 4259-5638 ?OT Time Calculation (min): 28 min ? ?Charges: OT General Charges ?$OT Visit: 1 Visit ?OT Treatments ?$Self Care/Home Management : 23-37 mins ? ?Latoya [name], OTR/L ?Acute Rehab Services ?Pager [phone] ?Office [phone] ? ? ? ?Latoya [name] ?09/04/2021, 2:33 PM ?

## 2021-09-04 NOTE — Progress Notes (Addendum)
?PROGRESS NOTE ? ? ? ?Latoya [name]  GYJ:[id number] DOB: 08/13/1985 DOA: 08/02/2021 ?PCP: Pcp, No ? ? ?Brief Narrative:  ?Patient is a 36 years old female with past medical history of MSSA tricuspid valve endocarditis in 2020 as well as MRSA discitis and osteomyelitis in 2021 with ongoing IV drug abuse presented to hospital with low back pain, cough and fever.  MRI of the lumbar spine was negative for infection but blood culture was positive for MRSA bacteremia with tricuspid valve vegetation and septic pulmonary emboli.  Patient was seen by CT surgery and underwent angio VAC  procedure, but the discovery of a significant PFO required the abortion of this procedure.  She has been seen by [facility] and plan is to continue antibiotic until 09/16/2021 and to repeat TTE about 5 to 7 days prior to discontinuation of antibiotics. ?  ?Assessment & Plan: ?  ?Principal Problem: ?  MRSA bacteremia ?Active Problems: ?  Endocarditis of tricuspid valve ?  History of bacterial endocarditis ?  Iron deficiency anemia ?  IV drug abuse (HCC) ?  Opioid use disorder ?  Septic pulmonary embolism (HCC) ? ?MRSA bacteremia, tricuspid valve endocarditis, septic pulmonary emboli ?Severe sepsis on presentation. Sepsis physiology has improved.  ?Plan is to continue vancomycin through 09/16/2021.  ?Plan to repeat TTE 5 to 7 days prior to discontinuation of antibiotics -we will try to schedule 5/8-5/9 ?  ?IV drug abuse ?Emphasized cessation  ?08/31/21: Patient had a visitor to the room who brought her bag of heroin which she snorted -Found unresponsive by nursing staff required Narcan ?-UDS positive for opiates ? ?Chronic lumbar pain, history of lumbar osteomyelitis ?-MRI of the lumbosacral spine this time which showed no evidence of discitis or osteomyelitis.   ?-Continue oxycodone, Lyrica, baclofen, Cymbalta, Dilaudid, ibuprofen. Scheduled oxycodone in effort to wean down/off on dilaudid. ? ?Physical debility/ambulatory dysfunction, likely secondary to  above ?Physical therapy Occupational Therapy recommending outpatient PT OT at this time. ?Fall precautions ? ?DVT prophylaxis: xarelto(REFUSING LOVENOX) ?Code Status: Full ?Family Communication: None present ? ?Status is: Inpatient ? ?Dispo: The patient is from: Home ?             Anticipated d/c is to: Home ?             Anticipated d/c date is: Pending above ?             Patient currently not medically stable for discharge ? ?Consultants:  ?Infectious disease ? ?Procedures:  ?None ? ?Antimicrobials:  ?Vancomycin ? ?Subjective: ?No acute issues or events overnight ? ?Objective: ?Vitals:  ? 09/03/21 1131 09/03/21 2018 09/04/21 0023 09/04/21 0412  ?BP: 118/86 113/78 101/71 99/75  ?Pulse: 84 (!) 103 100 91  ?Resp: 20 20 19 19   ?Temp: 98.3 ?F (36.8 ?C) 98.8 ?F (37.1 ?C) 99.7 ?F (37.6 ?C) 98.6 ?F (37 ?C)  ?TempSrc: Oral Oral Oral Oral  ?SpO2: 97% 96% 96% 97%  ?Weight:      ?Height:      ? ? ?Intake/Output Summary (Last 24 hours) at 09/04/2021 11/04/2021 ?Last data filed at 09/04/2021 11/04/2021 ?Gross per 24 hour  ?Intake 1770 ml  ?Output --  ?Net 1770 ml  ? ? ?Filed Weights  ? 08/03/21 1036 08/08/21 1111 08/09/21 1607  ?Weight: 82.2 kg 82.2 kg 87.5 kg  ? ? ?Examination: ? ?General:  Pleasantly resting in bed, No acute distress. ?HEENT:  Normocephalic atraumatic.  Sclerae nonicteric, noninjected.  Extraocular movements intact bilaterally. ?Neck:  Without mass or deformity.  Trachea  is midline. ?Lungs:  Clear to auscultate bilaterally without rhonchi, wheeze, or rales. ?Heart:  Regular rate and rhythm.  Without murmurs, rubs, or gallops. ?Abdomen:  Soft, nontender, nondistended.  Without guarding or rebound. ?Extremities: Without cyanosis, clubbing, edema, or obvious deformity. ?Vascular:  Dorsalis pedis and posterior tibial pulses palpable bilaterally. ?Skin:  Warm and dry, no erythema ? ?Data Reviewed: I have personally reviewed following labs and imaging studies ? ?CBC: ?Recent Labs  ?Lab 08/31/21 ?0345 09/04/21 ?0419  ?WBC 10.9*  11.7*  ?HGB 9.3* 9.5*  ?HCT 31.6* 30.2*  ?MCV 78.6* 77.4*  ?PLT 220 201  ? ? ?Basic Metabolic Panel: ?Recent Labs  ?Lab 08/31/21 ?0345 09/03/21 ?0100  ?NA 137 135  ?K 4.0 3.4*  ?CL 102 104  ?CO2 26 25  ?GLUCOSE 100* 136*  ?BUN 17 18  ?CREATININE 0.68 0.68  ?CALCIUM 9.1 8.4*  ?MG 2.2  --   ? ? ?GFR: ?Estimated Creatinine Clearance: 110.4 mL/min (by C-G formula based on SCr of 0.68 mg/dL). ?Liver Function Tests: ?No results for input(s): AST, ALT, ALKPHOS, BILITOT, PROT, ALBUMIN in the last 168 hours. ?No results for input(s): LIPASE, AMYLASE in the last 168 hours. ?No results for input(s): AMMONIA in the last 168 hours. ?Coagulation Profile: ?No results for input(s): INR, PROTIME in the last 168 hours. ?Cardiac Enzymes: ?No results for input(s): CKTOTAL, CKMB, CKMBINDEX, TROPONINI in the last 168 hours. ?BNP (last 3 results) ?No results for input(s): PROBNP in the last 8760 hours. ?HbA1C: ?No results for input(s): HGBA1C in the last 72 hours. ?CBG: ?Recent Labs  ?Lab 08/31/21 ?1443  ?GLUCAP 178*  ? ? ?Lipid Profile: ?No results for input(s): CHOL, HDL, LDLCALC, TRIG, CHOLHDL, LDLDIRECT in the last 72 hours. ?Thyroid Function Tests: ?No results for input(s): TSH, T4TOTAL, FREET4, T3FREE, THYROIDAB in the last 72 hours. ?Anemia Panel: ?No results for input(s): VITAMINB12, FOLATE, FERRITIN, TIBC, IRON, RETICCTPCT in the last 72 hours. ?Sepsis Labs: ?No results for input(s): PROCALCITON, LATICACIDVEN in the last 168 hours. ? ?No results found for this or any previous visit (from the past 240 hour(s)).  ? ?Radiology Studies: ?No results found. ? ?Scheduled Meds: ? baclofen  10 mg Oral Q6H  ? Chlorhexidine Gluconate Cloth  6 each Topical Daily  ? DULoxetine  40 mg Oral Daily  ? feeding supplement  1 Container Oral BID BM  ? feeding supplement  237 mL Oral Q24H  ? ferrous gluconate  324 mg Oral Q breakfast  ? Gerhardt's butt cream   Topical BID  ? magnesium oxide  400 mg Oral BID  ? multivitamin with minerals  1 tablet Oral  Daily  ? nicotine  21 mg Transdermal Daily  ? pantoprazole  40 mg Oral Daily  ? pregabalin  75 mg Oral TID  ? rivaroxaban  10 mg Oral Q supper  ? senna-docusate  1 tablet Oral Daily  ? thiamine  100 mg Oral Daily  ? ?Continuous Infusions: ? lactated ringers Stopped (08/11/21 1336)  ? vancomycin Stopped (09/04/21 0022)  ? ? LOS: 33 days  ? ?Time spent: ? ?Azucena Fallen, DO ?[facility] ? ?If 7PM-7AM, please contact night-coverage ?[email or web address] ? ?09/04/2021, 7:22 AM   ? ? ? ?

## 2021-09-04 NOTE — Progress Notes (Signed)
Pharmacy Antibiotic Note ? ?Latoya [name] is a 36 y.o. female admitted on 08/02/2021 with MRSA bacteremia-  septic pulmonary emboli/endocarditis. Pharmacy has been consulted for Vancomycin dosing.  ? ?SCr remains stable.  Vancomycin peak yesterday = 45, trough = 5.  Based on these levels, calculated AUC 461 which is within goal range. ? ?Plan: ?-Continue vancomycin 1750mg  IV q24h through 5/13 ?-Vancomycin levels in a few days/week. ? ? ?Height: 5\' 6"  (167.6 cm) ?Weight: 87.5 kg (192 lb 14.4 oz) ?IBW/kg (Calculated) : 59.3 ? ?Temp (24hrs), Avg:98.8 ?F (37.1 ?C), Min:98.3 ?F (36.8 ?C), Max:99.7 ?F (37.6 ?C) ? ?Recent Labs  ?Lab 08/29/21 ? 08/31/21 ?0345 09/03/21 ?0100 09/03/21 ?2130 09/04/21 ?0419  ?WBC  --  10.9*  --   --  11.7*  ?CREATININE  --  0.68 0.68  --   --   ?VANCOTROUGH 14*  --   --  5*  --   ?VANCOPEAK  --   --  45*  --   --   ? ?  ?Estimated Creatinine Clearance: 110.4 mL/min (by C-G formula based on SCr of 0.68 mg/dL).   ? ?Allergies  ?Allergen Reactions  ? Naltrexone Anxiety  ?  Severe anxiety, restlessness, vomiting  ? ? ?Antimicrobials this admission: ?Cefepime 3/29 >> 3/30 ?Flagyl 3/29 >> 3/30 ?Vancomycin 3/29 >> ? ?Dose adjustments this admission: ?Dose adjustments this admission: ?3/30 Vanc 750 >> 1250 q12 w/ new wt and improving SCr  ? ?4/1: VTr 9; VPk 21, calc AUC 428- technically at goal but since endocarditits will Inc 1500mg  IV q12h to target higher AUC/Cmin ?4/3-4/4: VT 14, VP 38- AUC 697- Adj to 750 q 8  ?4/10: VP 29, VT 15, AUC 591.8, Cmin 12.5 >> 1gm IV q12h for calculated AUC 525.5 ?4/16- VP 30 mcg/mL drawn at 2128 (4/16) ?Dose admin time: 1722 (4/16) over 60 min ?VT 14 drawn 0530  AUC cal 600  ?No change 1gm q12  ?4/24 VP 38 (~1 hr late), VT 16 -AUC 655 -Adj to 1750 q24h ?4/30 VP 45/VT 5 > AUC 461, con't 1750 q 24 hrs ? ?Microbiology results: ?3/29 Influenza/COVID>> neg ?3/29 MRSA PCR >> positive ?3/29 BCx >> GPC in 4/4 bottles (BCID MRSA) ?3/31 BCX: MRSA - only 1 set drawn  ?4/2  BCx: NG ? ? ?Carnita Golob, Pharm D, BCPS, BCCP ?Clinical Pharmacist ? 09/04/2021 10:38 AM  ? ?[facility] pharmacy phone numbers are listed on [email or web address] ? ? ? ? ?

## 2021-09-05 DIAGNOSIS — B9562 Methicillin resistant Staphylococcus aureus infection as the cause of diseases classified elsewhere: Secondary | ICD-10-CM | POA: Diagnosis not present

## 2021-09-05 DIAGNOSIS — R7881 Bacteremia: Secondary | ICD-10-CM | POA: Diagnosis not present

## 2021-09-05 NOTE — Progress Notes (Signed)
Mobility Specialist Progress Note ? ? 09/05/21 1912  ?Mobility  ?Activity Ambulated with assistance in room;Transferred from bed to chair  ?Level of Assistance Modified independent, requires aide device or extra time  ?Assistive Device Front wheel walker  ?Distance Ambulated (ft) 84 ft  ?Activity Response Tolerated well  ?$Mobility charge 1 Mobility  ? ?Pre Mobility: 104/64 BP ? ?Received pt in bed claiming RLE is feeling better and agreeable. No physical assistance needed throughout but min cues required for emphasis on hip extension when ambulating. Left in chair w/ call bell in reach and needs met.  ? ?Holland Falling ?Mobility Specialist ?Phone Number [phone] ? ?

## 2021-09-05 NOTE — Progress Notes (Signed)
?PROGRESS NOTE ? ? ? ?Latoya [name]  FIE:[id number] DOB: [birth year] DOA: 08/02/2021 ?PCP: Pcp, No ? ? ?Brief Narrative:  ?Patient is a 36 years old female with past medical history of MSSA tricuspid valve endocarditis in 2020 as well as MRSA discitis and osteomyelitis in 2021 with ongoing IV drug abuse presented to hospital with low back pain, cough and fever.  MRI of the lumbar spine was negative for infection but blood culture was positive for MRSA bacteremia with tricuspid valve vegetation and septic pulmonary emboli.  Patient was seen by CT surgery and underwent angio VAC  procedure, but the discovery of a significant PFO required the abortion of this procedure.  She has been seen by IDA and plan is to continue antibiotic until 09/16/2021 and to repeat TTE about 5 to 7 days prior to discontinuation of antibiotics. ?  ?Assessment & Plan: ?  ?Principal Problem: ?  MRSA bacteremia ?Active Problems: ?  Endocarditis of tricuspid valve ?  History of bacterial endocarditis ?  Iron deficiency anemia ?  IV drug abuse (HCC) ?  Opioid use disorder ?  Septic pulmonary embolism (HCC) ? ?MRSA bacteremia, tricuspid valve endocarditis, septic pulmonary emboli ?Severe sepsis on presentation. Sepsis physiology has improved.  ?Plan is to continue vancomycin through 09/16/2021.  ?Plan to repeat TTE 5 to 7 days prior to discontinuation of antibiotics -we will try to schedule 5/8-5/9 ?  ?IV drug abuse ?Emphasized cessation  ?08/31/21: Patient had a visitor to the room who brought her bag of heroin which she snorted -Found unresponsive by nursing staff required Narcan ?-UDS positive for opiates ? ?Chronic lumbar pain, history of lumbar osteomyelitis ?-MRI of the lumbosacral spine this time which showed no evidence of discitis or osteomyelitis.   ?-Continue oxycodone, Lyrica, baclofen, Cymbalta, Dilaudid, ibuprofen. Scheduled oxycodone in effort to wean down/off on dilaudid. ? ?Physical debility/ambulatory dysfunction, likely secondary to  above ?Physical therapy Occupational Therapy recommending outpatient PT OT at this time. ?Fall precautions ? ?Lice infection, resolved ?Not POA, likely brought in from outside visitors ?Patient completed permethrin on 08/31/2021 ?Unfortunately patient's belongings were placed into plastic bags but were not treated at that time, have instructed patient that she is not open these bags while in the hospital as they are considered contaminated ? ?DVT prophylaxis: xarelto(REFUSING LOVENOX) ?Code Status: Full ?Family Communication: None present ? ?Status is: Inpatient ? ?Dispo: The patient is from: Home ?             Anticipated d/c is to: Home ?             Anticipated d/c date is: Pending above ?             Patient currently not medically stable for discharge ? ?Consultants:  ?Infectious disease ? ?Procedures:  ?None ? ?Antimicrobials:  ?Vancomycin ? ?Subjective: ?No acute issues or events overnight ? ?Objective: ?Vitals:  ? 09/04/21 1947 09/05/21 0009 09/05/21 1660 09/05/21 0739  ?BP: 112/79 113/75 106/79 108/77  ?Pulse: 96 99 90 77  ?Resp: 20 20 20 16   ?Temp: 98.9 ?F (37.2 ?C) 99.2 ?F (37.3 ?C) 98.4 ?F (36.9 ?C) 98.2 ?F (36.8 ?C)  ?TempSrc: Oral Oral Oral Oral  ?SpO2: 99% 99% 98% 99%  ?Weight:      ?Height:      ? ?No intake or output data in the 24 hours ending 09/05/21 0747 ? ?Filed Weights  ? 08/03/21 1036 08/08/21 1111 08/09/21 1607  ?Weight: 82.2 kg 82.2 kg 87.5 kg  ? ? ?Examination: ? ?  General:  Pleasantly resting in bed, No acute distress. ?HEENT:  Normocephalic atraumatic.  Sclerae nonicteric, noninjected.  Extraocular movements intact bilaterally. ?Neck:  Without mass or deformity.  Trachea is midline. ?Lungs:  Clear to auscultate bilaterally without rhonchi, wheeze, or rales. ?Heart:  Regular rate and rhythm.  Without murmurs, rubs, or gallops. ?Abdomen:  Soft, nontender, nondistended.  Without guarding or rebound. ?Extremities: Without cyanosis, clubbing, edema, or obvious deformity. ?Vascular:  Dorsalis  pedis and posterior tibial pulses palpable bilaterally. ?Skin:  Warm and dry, no erythema ? ?Data Reviewed: I have personally reviewed following labs and imaging studies ? ?CBC: ?Recent Labs  ?Lab 08/31/21 ?0345 09/04/21 ?0419  ?WBC 10.9* 11.7*  ?HGB 9.3* 9.5*  ?HCT 31.6* 30.2*  ?MCV 78.6* 77.4*  ?PLT 220 201  ? ? ?Basic Metabolic Panel: ?Recent Labs  ?Lab 08/31/21 ?0345 09/03/21 ?0100  ?NA 137 135  ?K 4.0 3.4*  ?CL 102 104  ?CO2 26 25  ?GLUCOSE 100* 136*  ?BUN 17 18  ?CREATININE 0.68 0.68  ?CALCIUM 9.1 8.4*  ?MG 2.2  --   ? ? ?GFR: ?Estimated Creatinine Clearance: 110.4 mL/min (by C-G formula based on SCr of 0.68 mg/dL). ?Liver Function Tests: ?No results for input(s): AST, ALT, ALKPHOS, BILITOT, PROT, ALBUMIN in the last 168 hours. ?No results for input(s): LIPASE, AMYLASE in the last 168 hours. ?No results for input(s): AMMONIA in the last 168 hours. ?Coagulation Profile: ?No results for input(s): INR, PROTIME in the last 168 hours. ?Cardiac Enzymes: ?No results for input(s): CKTOTAL, CKMB, CKMBINDEX, TROPONINI in the last 168 hours. ?BNP (last 3 results) ?No results for input(s): PROBNP in the last 8760 hours. ?HbA1C: ?No results for input(s): HGBA1C in the last 72 hours. ?CBG: ?Recent Labs  ?Lab 08/31/21 ?1443  ?GLUCAP 178*  ? ? ?Lipid Profile: ?No results for input(s): CHOL, HDL, LDLCALC, TRIG, CHOLHDL, LDLDIRECT in the last 72 hours. ?Thyroid Function Tests: ?No results for input(s): TSH, T4TOTAL, FREET4, T3FREE, THYROIDAB in the last 72 hours. ?Anemia Panel: ?No results for input(s): VITAMINB12, FOLATE, FERRITIN, TIBC, IRON, RETICCTPCT in the last 72 hours. ?Sepsis Labs: ?No results for input(s): PROCALCITON, LATICACIDVEN in the last 168 hours. ? ?No results found for this or any previous visit (from the past 240 hour(s)).  ? ?Radiology Studies: ?No results found. ? ?Scheduled Meds: ? baclofen  10 mg Oral Q6H  ? Chlorhexidine Gluconate Cloth  6 each Topical Daily  ? DULoxetine  40 mg Oral Daily  ? feeding  supplement  1 Container Oral BID BM  ? feeding supplement  237 mL Oral Q24H  ? ferrous gluconate  324 mg Oral Q breakfast  ? Gerhardt's butt cream   Topical BID  ? magnesium oxide  400 mg Oral BID  ? multivitamin with minerals  1 tablet Oral Daily  ? nicotine  21 mg Transdermal Daily  ? pantoprazole  40 mg Oral Daily  ? pregabalin  75 mg Oral TID  ? rivaroxaban  10 mg Oral Q supper  ? senna-docusate  1 tablet Oral Daily  ? thiamine  100 mg Oral Daily  ? ?Continuous Infusions: ? lactated ringers Stopped (08/11/21 1336)  ? vancomycin 1,750 mg (09/04/21 2123)  ? ? LOS: 34 days  ? ?Time spent: ? ?Azucena Fallen, DO ?[facility] ? ?If 7PM-7AM, please contact night-coverage ?[email or web address] ? ?09/05/2021, 7:47 AM   ? ? ? ?

## 2021-09-06 DIAGNOSIS — Z8619 Personal history of other infectious and parasitic diseases: Secondary | ICD-10-CM

## 2021-09-06 DIAGNOSIS — M545 Low back pain, unspecified: Secondary | ICD-10-CM

## 2021-09-06 DIAGNOSIS — G8929 Other chronic pain: Secondary | ICD-10-CM

## 2021-09-06 DIAGNOSIS — R262 Difficulty in walking, not elsewhere classified: Secondary | ICD-10-CM

## 2021-09-06 DIAGNOSIS — B9562 Methicillin resistant Staphylococcus aureus infection as the cause of diseases classified elsewhere: Secondary | ICD-10-CM | POA: Diagnosis not present

## 2021-09-06 DIAGNOSIS — R7881 Bacteremia: Secondary | ICD-10-CM | POA: Diagnosis not present

## 2021-09-06 HISTORY — DX: Personal history of other infectious and parasitic diseases: Z86.19

## 2021-09-06 HISTORY — DX: Difficulty in walking, not elsewhere classified: R26.2

## 2021-09-06 HISTORY — DX: Other chronic pain: G89.29

## 2021-09-06 LAB — BASIC METABOLIC PANEL
Anion gap: 8 (ref 5–15)
BUN: 17 mg/dL (ref 6–20)
CO2: 25 mmol/L (ref 22–32)
Calcium: 8.7 mg/dL — ABNORMAL LOW (ref 8.9–10.3)
Chloride: 104 mmol/L (ref 98–111)
Creatinine, Ser: 0.58 mg/dL (ref 0.44–1.00)
GFR, Estimated: >60 mL/min (ref >60)
Glucose, Bld: 97 mg/dL (ref 70–99)
Potassium: 3.9 mmol/L (ref 3.5–5.1)
Sodium: 137 mmol/L (ref 135–145)

## 2021-09-06 NOTE — Assessment & Plan Note (Addendum)
Not POA, likely brought in from outside visitors ?Patient completed permethrin on 08/31/2021.  To get repeat treatment today. ?Unfortunately patient's belongings were placed into plastic bags but were not treated at that time, have instructed patient that she is not open these bags while in the hospital as they are considered contaminated ?

## 2021-09-06 NOTE — Assessment & Plan Note (Signed)
MR T and L spine unremarkable ?

## 2021-09-06 NOTE — Progress Notes (Signed)
?PROGRESS NOTE ? ? ? ?Latoya [name]  PYP:[id number] DOB: [birth year] DOA: 08/02/2021 ?PCP: Pcp, No  ?Chief Complaint  ?Patient presents with  ? Drug Problem  ? Back Pain  ? Leg Pain  ? Dysuria  ? ? ?Brief Narrative:  ?Patient is Latoya [name] 36 years old female with past medical history of MSSA tricuspid valve endocarditis in 2020 as well as MRSA discitis and osteomyelitis in 2021 with ongoing IV drug abuse presented to hospital with low back pain, cough and fever.  MRI of the lumbar spine was negative for infection but blood culture was positive for MRSA bacteremia with tricuspid valve vegetation and septic pulmonary emboli.  Patient was seen by CT surgery and underwent angio VAC  procedure, but the discovery of Latoya [name] significant PFO required the abortion of this procedure.  She has been seen by IDA and plan is to continue antibiotic until 09/16/2021 and to repeat TTE about 5 to 7 days prior to discontinuation of antibiotics.  ? ? ?Assessment & Plan: ?  ?Principal Problem: ?  MRSA bacteremia ?Active Problems: ?  Endocarditis of tricuspid valve ?  History of bacterial endocarditis ?  Septic pulmonary embolism ([place]) ?  Opioid use disorder ?  IV drug abuse ([place]) ?  Chronic lumbar pain ?  Iron deficiency anemia ?  Ambulatory dysfunction ?  History of lice ? ? ?Assessment and Plan: ?* MRSA bacteremia ?Treating bacteremia as above ? ?Septic pulmonary embolism ([place]) ?With small bilateral effusions ?Follow CXR intermittent ? ?History of bacterial endocarditis ?TV in 2020, MSSA, got angiovac at [facility]. Treated with Ancef at that time. 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 ?-- as above ? ?Endocarditis of tricuspid valve ?Patient presenting to the ED with back pain and fevers in the setting of continued IV drug abuse.  Met criteria for severe sepsis with endorgan failure and hypotension on admission complicated by pulmonary septic emboli and MRSA bacteremia with tricuspid valve  endocarditis on echocardiogram. ?--Infectious disease following, appreciate assistance ?--CT surgery c/s, underwent angio vac, but procedure was aborted due to positive bubble study concerning for PFO with further investigation revealing connection between R and L atrium with flows from R to L ?--last ID note I see from 4/12? (recommending 6 weeks from negative culture, EOT 5/13, not home abx candidate, follow up appt 5/15) ?--initial blood cultures from 3/29 with MRSA.  repeat blood cultures 4/2; 1/4 positive for Staph epidermidis, likely contaminant; otherwise remaining bottles no growth x5 days ?--Continue vancomycin, pharmacy consulted for dosing/monitoring ?--planning for repeat TTE 5-7 days prior to d/c abx ? ?IV drug abuse ([place]) ?Counseled on need for complete cessation given her recurrent tricuspid valve endocarditis now with MRSA ? ?Opioid use disorder ?Avoid IV abx ?Could consider suboxone, though need follow up and would need her to be agreeable with induction here in hospital.  Will discuss.  Currently on oxycodone prn. ? ? ?Chronic lumbar pain ?MR T and L spine unremarkable ? ?Ambulatory dysfunction ?Continue PT/OT ? ?Iron deficiency anemia ?Iron panel with serum iron of 22, transferrin saturation 7, ferritin 51 and TIBC 312. ?--Ferrous gluconate 324 mg p.o. daily ? ?History of lice ?Not POA, likely brought in from outside visitors ?Patient completed permethrin on 08/31/2021 ?Unfortunately patient's belongings were placed into plastic bags but were not treated at that time, have instructed patient that she is not open these bags while in the hospital as they are considered contaminated ? ?Hyponatremia-resolved as of 08/27/2021 ?resolved ? ? ? ?  DVT prophylaxis: xarelto ?Code Status: full ?Family Communication: none ?Disposition:  ? ?Status is: Inpatient ?Remains inpatient appropriate because: need for iv abx ?  ?Consultants:  ?ID ?Ct surgery ?cardiology ? ?Procedures:  ?Echo ?IMPRESSIONS  ? ? ? 1. Cannot  R/O small oscillating density on TV; suggest TEE to further  ?assess.  ? 2. Left ventricular ejection fraction, by estimation, is 60 to 65%. The  ?left ventricle has normal function. The left ventricle has no regional  ?wall motion abnormalities. Left ventricular diastolic parameters were  ?normal.  ? 3. Right ventricular systolic function is normal. The right ventricular  ?size is normal. There is normal pulmonary artery systolic pressure.  ? 4. The mitral valve is normal in structure. No evidence of mitral valve  ?regurgitation. No evidence of mitral stenosis.  ? 5. The aortic valve is tricuspid. Aortic valve regurgitation is not  ?visualized. No aortic stenosis is present.  ? 6. The inferior vena cava is normal in size with greater than 50%  ?respiratory variability, suggesting right atrial pressure of 3 mmHg.  ? ?Comparison(s): No prior Echocardiogram.  ? ?TEE ?IMPRESSIONS  ? ? ? 1. Left ventricular ejection fraction, by estimation, is 65 to 70%. The  ?left ventricle has normal function.  ? 2. Right ventricular systolic function is mildly reduced. The right  ?ventricular size is mildly enlarged.  ? 3. Left atrial size was mildly dilated. No left atrial/left atrial  ?appendage thrombus was detected.  ? 4. Right atrial size was moderately dilated.  ? 5. The mitral valve is normal in structure. Trivial mitral valve  ?regurgitation.  ? 6. Large highly mobile 1.8 cm vegetation seems to arise from lateral cusp  ?of TV . The tricuspid valve is abnormal. Tricuspid valve regurgitation is  ?severe.  ? 7. The aortic valve is tricuspid. Aortic valve regurgitation is not  ?visualized. No aortic stenosis is present.  ? ?Antimicrobials:  ?Anti-infectives (From admission, onward)  ? ? Start     Dose/Rate Route Frequency Ordered Stop  ? 08/29/21 2200  vancomycin (VANCOREADY) IVPB 1750 mg/350 mL       ? 1,750 mg ?175 mL/hr over 120 Minutes Intravenous Every 24 hours 08/29/21 1121    ? 08/21/21 1800  vancomycin (VANCOREADY) IVPB  750 mg/150 mL  Status:  Discontinued       ? 750 mg ?150 mL/hr over 60 Minutes Intravenous Every 12 hours 08/21/21 1344 08/21/21 1347  ? 08/21/21 1800  vancomycin (VANCOCIN) IVPB 1000 mg/200 mL premix  Status:  Discontinued       ? 1,000 mg ?200 mL/hr over 60 Minutes Intravenous Every 12 hours 08/21/21 1347 08/29/21 1121  ? 08/15/21 0600  vancomycin (VANCOCIN) IVPB 1000 mg/200 mL premix  Status:  Discontinued       ? 1,000 mg ?200 mL/hr over 60 Minutes Intravenous Every 12 hours 08/14/21 2006 08/21/21 1344  ? 08/09/21 1000  vancomycin (VANCOREADY) IVPB 750 mg/150 mL  Status:  Discontinued       ? 750 mg ?150 mL/hr over 60 Minutes Intravenous Every 8 hours 08/09/21 0237 08/14/21 2006  ? 08/08/21 1700  vancomycin (VANCOREADY) IVPB 1500 mg/300 mL  Status:  Discontinued       ? 750 mg ?75 mL/hr over 120 Minutes Intravenous Every 8 hours 08/08/21 0920 08/09/21 0237  ? 08/05/21 1800  vancomycin (VANCOREADY) IVPB 1500 mg/300 mL  Status:  Discontinued       ? 1,500 mg ?150 mL/hr over 120 Minutes Intravenous Every 12 hours 08/05/21  5537 08/08/21 0920  ? 08/04/21 1700  vancomycin (VANCOREADY) IVPB 1250 mg/250 mL  Status:  Discontinued       ? 1,250 mg ?166.7 mL/hr over 90 Minutes Intravenous Every 12 hours 08/04/21 1613 08/05/21 0618  ? 08/03/21 1700  vancomycin (VANCOCIN) IVPB 1000 mg/200 mL premix  Status:  Discontinued       ? 1,000 mg ?200 mL/hr over 60 Minutes Intravenous Every 12 hours 08/03/21 1039 08/03/21 1042  ? 08/03/21 1700  Vancomycin (VANCOCIN) 1,250 mg in sodium chloride 0.9 % 250 mL IVPB  Status:  Discontinued       ? 1,250 mg ?166.7 mL/hr over 90 Minutes Intravenous Every 12 hours 08/03/21 1042 08/03/21 1111  ? 08/03/21 1700  Vancomycin (VANCOCIN) 1,250 mg in sodium chloride 0.9 % 250 mL IVPB  Status:  Discontinued       ? 1,250 mg ?166.7 mL/hr over 90 Minutes Intravenous Every 12 hours 08/03/21 1112 08/03/21 1124  ? 08/03/21 1700  Vancomycin (VANCOCIN) 1,250 mg in sodium chloride 0.9 % 250 mL IVPB  Status:   Discontinued       ? 1,250 mg ?175 mL/hr over 90 Minutes Intravenous Every 12 hours 08/03/21 1124 08/04/21 1614  ? 08/03/21 0600  vancomycin (VANCOCIN) IVPB 750 mg/150 ml premix  Status:  Discontinued

## 2021-09-06 NOTE — Assessment & Plan Note (Signed)
Treating bacteremia as above ?

## 2021-09-06 NOTE — Assessment & Plan Note (Signed)
Continue PT/OT

## 2021-09-06 NOTE — Assessment & Plan Note (Addendum)
With small bilateral effusions. ?

## 2021-09-07 DIAGNOSIS — G8929 Other chronic pain: Secondary | ICD-10-CM

## 2021-09-07 DIAGNOSIS — R7881 Bacteremia: Secondary | ICD-10-CM | POA: Diagnosis not present

## 2021-09-07 DIAGNOSIS — B9562 Methicillin resistant Staphylococcus aureus infection as the cause of diseases classified elsewhere: Secondary | ICD-10-CM | POA: Diagnosis not present

## 2021-09-07 HISTORY — DX: Other chronic pain: G89.29

## 2021-09-07 LAB — CBC WITH DIFFERENTIAL/PLATELET
Abs Immature Granulocytes: 0.03 10*3/uL (ref 0.00–0.07)
Basophils Absolute: 0.1 10*3/uL (ref 0.0–0.1)
Basophils Relative: 1 %
Eosinophils Absolute: 0.5 10*3/uL (ref 0.0–0.5)
Eosinophils Relative: 5 %
HCT: 30.8 % — ABNORMAL LOW (ref 36.0–46.0)
Hemoglobin: 9.4 g/dL — ABNORMAL LOW (ref 12.0–15.0)
Immature Granulocytes: 0 %
Lymphocytes Relative: 26 %
Lymphs Abs: 2.8 10*3/uL (ref 0.7–4.0)
MCH: 23.9 pg — ABNORMAL LOW (ref 26.0–34.0)
MCHC: 30.5 g/dL (ref 30.0–36.0)
MCV: 78.2 fL — ABNORMAL LOW (ref 80.0–100.0)
Monocytes Absolute: 0.9 10*3/uL (ref 0.1–1.0)
Monocytes Relative: 9 %
Neutro Abs: 6.2 10*3/uL (ref 1.7–7.7)
Neutrophils Relative %: 59 %
Platelets: 189 10*3/uL (ref 150–400)
RBC: 3.94 MIL/uL (ref 3.87–5.11)
RDW: 19.9 % — ABNORMAL HIGH (ref 11.5–15.5)
WBC: 10.5 10*3/uL (ref 4.0–10.5)
nRBC: 0 % (ref 0.0–0.2)

## 2021-09-07 LAB — COMPREHENSIVE METABOLIC PANEL
ALT: 12 U/L (ref 0–44)
AST: 12 U/L — ABNORMAL LOW (ref 15–41)
Albumin: 2.8 g/dL — ABNORMAL LOW (ref 3.5–5.0)
Alkaline Phosphatase: 63 U/L (ref 38–126)
Anion gap: 8 (ref 5–15)
BUN: 16 mg/dL (ref 6–20)
CO2: 24 mmol/L (ref 22–32)
Calcium: 8.8 mg/dL — ABNORMAL LOW (ref 8.9–10.3)
Chloride: 106 mmol/L (ref 98–111)
Creatinine, Ser: 0.62 mg/dL (ref 0.44–1.00)
GFR, Estimated: >60 mL/min (ref >60)
Glucose, Bld: 102 mg/dL — ABNORMAL HIGH (ref 70–99)
Potassium: 3.8 mmol/L (ref 3.5–5.1)
Sodium: 138 mmol/L (ref 135–145)
Total Bilirubin: 0.2 mg/dL — ABNORMAL LOW (ref 0.3–1.2)
Total Protein: 7.3 g/dL (ref 6.5–8.1)

## 2021-09-07 LAB — MAGNESIUM: Magnesium: 1.9 mg/dL (ref 1.7–2.4)

## 2021-09-07 LAB — PHOSPHORUS: Phosphorus: 4.9 mg/dL — ABNORMAL HIGH (ref 2.5–4.6)

## 2021-09-07 MED ORDER — ACETAMINOPHEN 325 MG PO TABS
325.0000 mg | ORAL_TABLET | Freq: Four times a day (QID) | ORAL | Status: DC | PRN
  Administered 2021-09-11 – 2021-09-17 (×6): 325 mg via ORAL
  Filled 2021-09-07 (×6): qty 1

## 2021-09-07 MED ORDER — BACLOFEN 5 MG HALF TABLET
5.0000 mg | ORAL_TABLET | Freq: Two times a day (BID) | ORAL | Status: DC
  Administered 2021-09-15 – 2021-09-17 (×4): 5 mg via ORAL
  Filled 2021-09-07 (×5): qty 1

## 2021-09-07 MED ORDER — BACLOFEN 10 MG PO TABS
10.0000 mg | ORAL_TABLET | Freq: Three times a day (TID) | ORAL | Status: AC
  Administered 2021-09-07 – 2021-09-11 (×12): 10 mg via ORAL
  Filled 2021-09-07 (×12): qty 1

## 2021-09-07 MED ORDER — SODIUM CHLORIDE 0.9 % IV SOLN
INTRAVENOUS | Status: DC | PRN
Start: 2021-09-07 — End: 2021-09-17

## 2021-09-07 MED ORDER — BACLOFEN 5 MG HALF TABLET
5.0000 mg | ORAL_TABLET | Freq: Three times a day (TID) | ORAL | Status: AC
  Administered 2021-09-11 – 2021-09-15 (×12): 5 mg via ORAL
  Filled 2021-09-07 (×12): qty 1

## 2021-09-07 MED ORDER — ACETAMINOPHEN 500 MG PO TABS
500.0000 mg | ORAL_TABLET | Freq: Four times a day (QID) | ORAL | Status: DC
  Administered 2021-09-07 – 2021-09-17 (×35): 500 mg via ORAL
  Filled 2021-09-07 (×36): qty 1

## 2021-09-07 MED ORDER — DICLOFENAC SODIUM 1 % EX GEL
2.0000 g | Freq: Four times a day (QID) | CUTANEOUS | Status: DC
  Administered 2021-09-07 – 2021-09-16 (×22): 2 g via TOPICAL
  Filled 2021-09-07: qty 100

## 2021-09-07 MED ORDER — LIDOCAINE 5 % EX PTCH
1.0000 | MEDICATED_PATCH | CUTANEOUS | Status: DC
  Administered 2021-09-07 – 2021-09-12 (×3): 1 via TRANSDERMAL
  Filled 2021-09-07 (×6): qty 1

## 2021-09-07 MED ORDER — BACLOFEN 5 MG HALF TABLET: 5.0000 mg | ORAL_TABLET | Freq: Every day | ORAL | Status: DC

## 2021-09-07 NOTE — Progress Notes (Signed)
Occupational Therapy Treatment ?Patient Details ?Name: Latoya [name] ?MRN: [id number] ?DOB: [birth year] ?Today's Date: 09/07/2021 ? ? ?History of present illness 36 y.o. female admitted 08/02/21 with back pain, hypotension, hyponatremia, leukocytosis and neutrophilia. Found to have bacteremia possible sepitc pulmonary emboli from endocarditis. PMH: IVDU, heroin abuse, ongoing. ?  ?OT comments ? Patient provided leg lifter to assist with bed mobility with supervision to perform and patient stating it was helpful. Patient provided back brace with min assist to donn.  Patient transferred to recliner and educated on reacher use for doffing socks and donning pants, simulated with pillow case, and donning socks with sock aide. Patient demonstrated good understanding of AE. Acute OT to continue to follow.   ? ?Recommendations for follow up therapy are one component of a multi-disciplinary discharge planning process, led by the attending physician.  Recommendations may be updated based on patient status, additional functional criteria and insurance authorization. ?   ?Follow Up Recommendations ? Outpatient OT  ?  ?Assistance Recommended at Discharge Frequent or constant Supervision/Assistance  ?Patient can return home with the following ? A little help with walking and/or transfers;A little help with bathing/dressing/bathroom;Help with stairs or ramp for entrance;Assistance with cooking/housework;Assist for transportation ?  ?Equipment Recommendations ? BSC/3in1  ?  ?Recommendations for Other Services   ? ?  ?Precautions / Restrictions Precautions ?Precautions: Fall ?Precaution Booklet Issued: No ?Precaution Comments: Contact precs, lice, back precs for comfort due to pain ?Required Braces or Orthoses: Spinal Brace ?Spinal Brace: Lumbar corset ?Restrictions ?Weight Bearing Restrictions: No  ? ? ?  ? ?Mobility Bed Mobility ?Overal bed mobility: Needs Assistance ?Bed Mobility: Supine to Sit ?  ?  ?Supine to sit: Supervision, HOB  elevated (leg lifter used to assist with BLEs) ?  ?  ?General bed mobility comments: patient provided leg lifter and education on use. Patient stated leg lifter was better than previous method ?  ? ?Transfers ?Overall transfer level: Needs assistance ?Equipment used: Rolling walker (2 wheels) ?Transfers: Sit to/from Stand ?Sit to Stand: Min guard ?  ?  ?Step pivot transfers: Min guard ?  ?  ?General transfer comment: min guard to stand from EOB and to walk around bed to recliner ?  ?  ?Balance Overall balance assessment: Needs assistance ?Sitting-balance support: No upper extremity supported, Feet supported ?Sitting balance-Leahy Scale: Good ?  ?  ?Standing balance support: Bilateral upper extremity supported, During functional activity ?Standing balance-Leahy Scale: Fair ?Standing balance comment: used RW for support ?  ?  ?  ?  ?  ?  ?  ?  ?  ?  ?  ?   ? ?ADL either performed or assessed with clinical judgement  ? ?ADL Overall ADL's : Needs assistance/impaired ?  ?  ?  ?  ?  ?  ?  ?  ?  ?  ?Lower Body Dressing: Minimal assistance;Sitting/lateral leans;With adaptive equipment ?Lower Body Dressing Details (indicate cue type and reason): Adaptive equipment training with reacher and sock aide for donning socks and simulated donning pants with pillow case ?  ?  ?  ?  ?  ?  ?  ?General ADL Comments: demonstrated good understanding of AE use ?  ? ?Extremity/Trunk Assessment   ?  ?  ?  ?  ?  ? ?Vision   ?  ?  ?Perception   ?  ?Praxis   ?  ? ?Cognition Arousal/Alertness: Awake/alert ?Behavior During Therapy: [facility] for tasks assessed/performed ?Overall Cognitive Status: Within Functional Limits for tasks assessed ?  ?  ?  ?  ?  ?  ?  ?  ?  ?  ?  ?  ?  ?  ?  ?  Problem Solving: Slow processing, Difficulty sequencing, Requires verbal cues ?General Comments: demonstrated good understanding of AE use ?  ?  ?   ?Exercises   ? ?  ?Shoulder Instructions   ? ? ?  ?General Comments    ? ? ?Pertinent Vitals/ Pain       Pain  Assessment ?Pain Assessment: Faces ?Faces Pain Scale: Hurts little more ?Pain Location: back, BLE's ?Pain Descriptors / Indicators: Grimacing, Guarding ?Pain Intervention(s): Limited activity within patient's tolerance, Monitored during session, Repositioned ? ?Home Living   ?  ?  ?  ?  ?  ?  ?  ?  ?  ?  ?  ?  ?  ?  ?  ?  ?  ?  ? ?  ?Prior Functioning/Environment    ?  ?  ?  ?   ? ?Frequency ? Min 2X/week  ? ? ? ? ?  ?Progress Toward Goals ? ?OT Goals(current goals can now be found in the care plan section) ? Progress towards OT goals: Progressing toward goals ? ?Acute Rehab OT Goals ?Patient Stated Goal: get better ?OT Goal Formulation: With patient ?Time For Goal Achievement: 09/18/21 ?Potential to Achieve Goals: Good ?ADL Goals ?Pt Will Perform Grooming: with modified independence;standing ?Pt Will Perform Lower Body Dressing: with modified independence;with adaptive equipment;sit to/from stand ?Pt Will Transfer to Toilet: with modified independence;ambulating;bedside commode ?Pt Will Perform Toileting - Clothing Manipulation and hygiene: with modified independence;sit to/from stand ?Additional ADL Goal #1: Pt will Mod I in and OOB for basic ADLs (HOB flat, no rail, leg lifter prn, increased time)  ?Plan Discharge plan remains appropriate   ? ?Co-evaluation ? ? ?   ?  ?  ?  ?  ? ?  ?AM-PAC OT "6 Clicks" Daily Activity     ?Outcome Measure ? ? Help from another person eating meals?: None ?Help from another person taking care of personal grooming?: A Little ?Help from another person toileting, which includes using toliet, bedpan, or urinal?: A Little ?Help from another person bathing (including washing, rinsing, drying)?: A Lot ?Help from another person to put on and taking off regular upper body clothing?: A Little ?Help from another person to put on and taking off regular lower body clothing?: A Lot ?6 Click Score: 17 ? ?  ?End of Session Equipment Utilized During Treatment: Rolling walker (2 wheels);Other  (comment) (AE) ? ?OT Visit Diagnosis: Muscle weakness (generalized) (M62.81);Pain ?Pain - Right/Left: Right ?Pain - part of body: Leg ?  ?Activity Tolerance Patient tolerated treatment well ?  ?Patient Left in chair;with call bell/phone within reach ?  ?Nurse Communication Mobility status ?  ? ?   ? ?Time: 1517-6160 ?OT Time Calculation (min): 25 min ? ?Charges: OT General Charges ?$OT Visit: 1 Visit ?OT Treatments ?$Self Care/Home Management : 23-37 mins ? ?Alfonse Flavors, OTA ?Acute Rehabilitation Services  ?Pager [phone] ?Office [phone] ? ? ?Latoya [name] ?09/07/2021, 1:51 PM ?

## 2021-09-07 NOTE — Assessment & Plan Note (Addendum)
Started on baclofen during this admission, this is being tapered off.  ?Will continue cymbalta ?Schedule tylenol ?Trial lidocaine patch for low back pain ?Trial voltaren gel for lower extremity pain ?Was started on lyrica, it looks like during this hospitalization as well.   ?Oxycodone prn ? ? ?

## 2021-09-07 NOTE — Progress Notes (Signed)
?PROGRESS NOTE ? ? ? ?Latoya [name]  CXK:[id number] DOB: 1985-07-21 DOA: 08/02/2021 ?PCP: Pcp, No  ?Chief Complaint  ?Patient presents with  ? Drug Problem  ? Back Pain  ? Leg Pain  ? Dysuria  ? ? ?Brief Narrative:  ?Patient is Latoya [name] 36 years old female with past medical history of MSSA tricuspid valve endocarditis in 2020 as well as MRSA discitis and osteomyelitis in 2021 with ongoing IV drug abuse presented to hospital with low back pain, cough and fever.  MRI of the lumbar spine was negative for infection but blood culture was positive for MRSA bacteremia with tricuspid valve vegetation and septic pulmonary emboli.  Patient was seen by CT surgery and underwent angio VAC  procedure, but the discovery of Latoya [name] significant PFO required the abortion of this procedure.  She has been seen by [facility] and plan is to continue antibiotic until 09/16/2021 and to repeat TTE about 5 to 7 days prior to discontinuation of antibiotics.  ? ? ?Assessment & Plan: ?  ?Principal Problem: ?  MRSA bacteremia ?Active Problems: ?  Endocarditis of tricuspid valve ?  History of bacterial endocarditis ?  Septic pulmonary embolism ([place]) ?  Opioid use disorder ?  IV drug abuse ([place]) ?  Chronic lumbar pain ?  Iron deficiency anemia ?  Ambulatory dysfunction ?  Chronic pain ?  History of lice ? ? ?Assessment and Plan: ?* MRSA bacteremia ?Treating bacteremia as above ? ?Septic pulmonary embolism ([place]) ?With small bilateral effusions ?Follow CXR intermittent ? ?History of bacterial endocarditis ?TV in 2020, MSSA, got angiovac at [facility]. Treated with Ancef at that time. 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 ?-- as above ? ?Endocarditis of tricuspid valve ?Patient presenting to the ED with back pain and fevers in the setting of continued IV drug abuse.  Met criteria for severe sepsis with endorgan failure and hypotension on admission complicated by pulmonary septic emboli and MRSA bacteremia with  tricuspid valve endocarditis on echocardiogram. ?--Infectious disease following, appreciate assistance ?--CT surgery c/s, underwent angio vac, but procedure was aborted due to positive bubble study concerning for PFO with further investigation revealing connection between R and L atrium with flows from R to L ?--last ID note I see from 4/12? (recommending 6 weeks from negative culture, EOT 5/13, not home abx candidate, follow up appt 5/15) ?--initial blood cultures from 3/29 with MRSA.  repeat blood cultures 4/2; 1/4 positive for Staph epidermidis, likely contaminant; otherwise remaining bottles no growth x5 days ?--Continue vancomycin, pharmacy consulted for dosing/monitoring ?--planning for repeat TTE 5-7 days prior to d/c abx ? ?IV drug abuse ([place]) ?Counseled on need for complete cessation given her recurrent tricuspid valve endocarditis now with MRSA ? ?Opioid use disorder ?Avoid IV abx ?Could consider suboxone, though need follow up and would need her to be agreeable with induction here in hospital.  Will discuss.  Currently on oxycodone prn. ? ? ?Chronic lumbar pain ?MR T and L spine unremarkable ? ?Chronic pain ?Started on baclofen during this admission, will taper this to off, may need to be discharged with Latoya [name] few pills ?Will continue cymbalta ?Schedule tylenol ?Trial lidocaine patch for low back pain ?Trial voltaren gel for lower extremity pain ?Was started on lyrica, it looks like during this hospitalization as well.  i'm not sure about this long term, will consider tapering (but since already tapering baclofen, will continue to discuss). ?Oxycodone prn ? ? ? ?Ambulatory dysfunction ?Continue PT/OT ? ?  Iron deficiency anemia ?Iron panel with serum iron of 22, transferrin saturation 7, ferritin 51 and TIBC 312. ?--Ferrous gluconate 324 mg p.o. daily ? ?History of lice ?Not POA, likely brought in from outside visitors ?Patient completed permethrin on 08/31/2021 ?Unfortunately patient's belongings were placed  into plastic bags but were not treated at that time, have instructed patient that she is not open these bags while in the hospital as they are considered contaminated ? ?Hyponatremia-resolved as of 08/27/2021 ?resolved ? ? ? ?DVT prophylaxis: xarelto ?Code Status: full ?Family Communication: none ?Disposition:  ? ?Status is: Inpatient ?Remains inpatient appropriate because: need for iv abx ?  ?Consultants:  ?ID ?Ct surgery ?cardiology ? ?Procedures:  ?Echo ?IMPRESSIONS  ? ? ? 1. Cannot R/O small oscillating density on TV; suggest TEE to further  ?assess.  ? 2. Left ventricular ejection fraction, by estimation, is 60 to 65%. The  ?left ventricle has normal function. The left ventricle has no regional  ?wall motion abnormalities. Left ventricular diastolic parameters were  ?normal.  ? 3. Right ventricular systolic function is normal. The right ventricular  ?size is normal. There is normal pulmonary artery systolic pressure.  ? 4. The mitral valve is normal in structure. No evidence of mitral valve  ?regurgitation. No evidence of mitral stenosis.  ? 5. The aortic valve is tricuspid. Aortic valve regurgitation is not  ?visualized. No aortic stenosis is present.  ? 6. The inferior vena cava is normal in size with greater than 50%  ?respiratory variability, suggesting right atrial pressure of 3 mmHg.  ? ?Comparison(s): No prior Echocardiogram.  ? ?TEE ?IMPRESSIONS  ? ? ? 1. Left ventricular ejection fraction, by estimation, is 65 to 70%. The  ?left ventricle has normal function.  ? 2. Right ventricular systolic function is mildly reduced. The right  ?ventricular size is mildly enlarged.  ? 3. Left atrial size was mildly dilated. No left atrial/left atrial  ?appendage thrombus was detected.  ? 4. Right atrial size was moderately dilated.  ? 5. The mitral valve is normal in structure. Trivial mitral valve  ?regurgitation.  ? 6. Large highly mobile 1.8 cm vegetation seems to arise from lateral cusp  ?of TV . The tricuspid  valve is abnormal. Tricuspid valve regurgitation is  ?severe.  ? 7. The aortic valve is tricuspid. Aortic valve regurgitation is not  ?visualized. No aortic stenosis is present.  ? ?Antimicrobials:  ?Anti-infectives (From admission, onward)  ? ? Start     Dose/Rate Route Frequency Ordered Stop  ? 08/29/21 2200  vancomycin (VANCOREADY) IVPB 1750 mg/350 mL       ? 1,750 mg ?175 mL/hr over 120 Minutes Intravenous Every 24 hours 08/29/21 1121    ? 08/21/21 1800  vancomycin (VANCOREADY) IVPB 750 mg/150 mL  Status:  Discontinued       ? 750 mg ?150 mL/hr over 60 Minutes Intravenous Every 12 hours 08/21/21 1344 08/21/21 1347  ? 08/21/21 1800  vancomycin (VANCOCIN) IVPB 1000 mg/200 mL premix  Status:  Discontinued       ? 1,000 mg ?200 mL/hr over 60 Minutes Intravenous Every 12 hours 08/21/21 1347 08/29/21 1121  ? 08/15/21 0600  vancomycin (VANCOCIN) IVPB 1000 mg/200 mL premix  Status:  Discontinued       ? 1,000 mg ?200 mL/hr over 60 Minutes Intravenous Every 12 hours 08/14/21 2006 08/21/21 1344  ? 08/09/21 1000  vancomycin (VANCOREADY) IVPB 750 mg/150 mL  Status:  Discontinued       ? 750 mg ?  150 mL/hr over 60 Minutes Intravenous Every 8 hours 08/09/21 0237 08/14/21 2006  ? 08/08/21 1700  vancomycin (VANCOREADY) IVPB 1500 mg/300 mL  Status:  Discontinued       ? 750 mg ?75 mL/hr over 120 Minutes Intravenous Every 8 hours 08/08/21 0920 08/09/21 0237  ? 08/05/21 1800  vancomycin (VANCOREADY) IVPB 1500 mg/300 mL  Status:  Discontinued       ? 1,500 mg ?150 mL/hr over 120 Minutes Intravenous Every 12 hours 08/05/21 0618 08/08/21 0920  ? 08/04/21 1700  vancomycin (VANCOREADY) IVPB 1250 mg/250 mL  Status:  Discontinued       ? 1,250 mg ?166.7 mL/hr over 90 Minutes Intravenous Every 12 hours 08/04/21 1613 08/05/21 0618  ? 08/03/21 1700  vancomycin (VANCOCIN) IVPB 1000 mg/200 mL premix  Status:  Discontinued       ? 1,000 mg ?200 mL/hr over 60 Minutes Intravenous Every 12 hours 08/03/21 1039 08/03/21 1042  ? 08/03/21 1700   Vancomycin (VANCOCIN) 1,250 mg in sodium chloride 0.9 % 250 mL IVPB  Status:  Discontinued       ? 1,250 mg ?166.7 mL/hr over 90 Minutes Intravenous Every 12 hours 08/03/21 1042 08/03/21 1111  ? 08/03/21 1700  Vanco

## 2021-09-07 NOTE — Progress Notes (Signed)
Mobility Specialist Progress Note ? ? 09/07/21 1851  ?Mobility  ?Activity Transferred from chair to bed  ?Level of Assistance Contact guard assist, steadying assist  ?Assistive Device  ?(HHA)  ?Distance Ambulated (ft) 16 ft  ?Activity Response Tolerated well  ?$Mobility charge 1 Mobility  ? ?Received in chair requesting to get back to bed. Attempted tx w/o RW and pt made it to EOB w/o fault. Required HHA and hovering around bed rails w/ c/o glutes and hamstrings bothering her but claimed it was bearable. Left in bed w/ call bell in reach and bed alarm on. ? ?Frederico Hamman ?Mobility Specialist ?Phone Number [phone] ? ?

## 2021-09-07 NOTE — Plan of Care (Signed)
  Problem: Health Behavior/Discharge Planning: Goal: Ability to manage health-related needs will improve Outcome: Progressing   

## 2021-09-07 NOTE — Progress Notes (Signed)
Pharmacy Antibiotic Note ? ?Latoya [name] is a 36 y.o. female admitted on 08/02/2021 with MRSA bacteremia-  septic pulmonary emboli/endocarditis. Pharmacy has been consulted for Vancomycin dosing. Plans noted to continue antibiotics until 5/13 with repeat TTE 5-7 days before this ? ?SCr remains stable.  ?Vancomycin levels checked 5/1 with no changes needed ? ?Plan: ?-Continue vancomycin 1750mg  IV q24h through 5/13 ?-Recheck Vancomycin levels early next week ? ? ?Height: 5\' 6"  (167.6 cm) ?Weight: 87.5 kg (192 lb 14.4 oz) ?IBW/kg (Calculated) : 59.3 ? ?Temp (24hrs), Avg:98.6 ?F (37 ?C), Min:98.3 ?F (36.8 ?C), Max:99 ?F (37.2 ?C) ? ?Recent Labs  ?Lab 09/03/21 ?0100 09/03/21 ?2130 09/04/21 ?0419 09/06/21 ?0430 09/07/21 ?0440  ?WBC  --   --  11.7*  --  10.5  ?CREATININE 0.68  --   --  0.58 0.62  ?VANCOTROUGH  --  5*  --   --   --   ?VANCOPEAK 45*  --   --   --   --   ? ?  ?Estimated Creatinine Clearance: 110.4 mL/min (by C-G formula based on SCr of 0.62 mg/dL).   ? ?Allergies  ?Allergen Reactions  ? Naltrexone Anxiety  ?  Severe anxiety, restlessness, vomiting  ? ? ?Antimicrobials this admission: ?Cefepime 3/29 >> 3/30 ?Flagyl 3/29 >> 3/30 ?Vancomycin 3/29 >> ? ?Dose adjustments this admission: ?Dose adjustments this admission: ?3/30 Vanc 750 >> 1250 q12 w/ new wt and improving SCr  ? ?4/1: VTr 9; VPk 21, calc AUC 428- technically at goal but since endocarditits will Inc 1500mg  IV q12h to target higher AUC/Cmin ?4/3-4/4: VT 14, VP 38- AUC 697- Adj to 750 q 8  ?4/10: VP 29, VT 15, AUC 591.8, Cmin 12.5 >> 1gm IV q12h for calculated AUC 525.5 ?4/16- VP 30 mcg/mL drawn at 2128 (4/16) ?Dose admin time: 1722 (4/16) over 60 min ?VT 14 drawn 0530  AUC cal 600  ?No change 1gm q12  ?4/24 VP 38 (~1 hr late), VT 16 -AUC 655 -Adj to 1750 q24h ?4/30 VP 45/VT 5 > AUC 461, con't 1750 q 24 hrs ? ?Microbiology results: ?3/29 Influenza/COVID>> neg ?3/29 MRSA PCR >> positive ?3/29 BCx >> GPC in 4/4 bottles (BCID MRSA) ?3/31 BCX: MRSA - only  1 set drawn  ?4/2 BCx: NG ? ? ?4/29, PharmD ?Clinical Pharmacist ?**Pharmacist phone directory can now be found on [email or web address] ([email or web address]).  Listed under [facility]. ? ? ? ? ? ? ?

## 2021-09-07 NOTE — Progress Notes (Addendum)
Physical Therapy Treatment ?Patient Details ?Name: Latoya [name] ?MRN: [id number] ?DOB: 04-24-1986 ?Today's Date: 09/07/2021 ? ? ?History of Present Illness 36 y.o. female admitted 08/02/21 with back pain, hypotension, hyponatremia, leukocytosis and neutrophilia. Found to have bacteremia possible sepitc pulmonary emboli from endocarditis. PMH: IVDU, heroin abuse, ongoing. ? ?  ?PT Comments  ? ? Pt received sitting in recliner, agreeable to physical therapy session with emphasis on gait training. Pt has made progress toward PT goals exhibiting increased activity tolerance and ambulation distance. Pt ambulated 100 feet with RW in the hallway with cuing for exaggerated marching when taking steps to improve feet clearance and heel strike. Monitored HR while ambulating with HR max at 115 bpm. Pt continues with BLE weakness (LLE weaker than RLE). STS from regular toilet seat to RW and returned to chair at the end of the session. Pt performed marches X 5 while sitting. Educated pt on pressure relief including frequency. Pt continues to benefit from PT services to progress toward functional mobility goals. ?  ?Recommendations for follow up therapy are one component of a multi-disciplinary discharge planning process, led by the attending physician.  Recommendations may be updated based on patient status, additional functional criteria and insurance authorization. ? ?Assistance Recommended at Discharge Intermittent Supervision/Assistance  ?Patient can return home with the following A little help with walking and/or transfers;A little help with bathing/dressing/bathroom;Assistance with cooking/housework;Direct supervision/assist for medications management;Assist for transportation;Help with stairs or ramp for entrance ?  ?Equipment Recommendations ? Rolling walker (2 wheels);BSC/3in1  ?  ?   ?Precautions / Restrictions Precautions ?Precautions: Fall ?Precaution Booklet Issued: No ?Precaution Comments: Contact precs, lice, back  precs for comfort due to pain ?Required Braces or Orthoses: Spinal Brace ?Spinal Brace: Lumbar corset ?Restrictions ?Weight Bearing Restrictions: No  ?  ? ?Mobility ?  ? ?Transfers ?Overall transfer level: Needs assistance ?Equipment used: Rolling walker (2 wheels) ?Transfers: Sit to/from Stand ?Sit to Stand: Supervision ?  ?Step pivot transfers: Supervision ?  ?  ?  ?General transfer comment: supervision STS from chair and BSC to RW and to walk around bed to recliner ?  ? ?Ambulation/Gait ?Ambulation/Gait assistance: Supervision ?Gait Distance (Feet): 100 Feet ?Assistive device: Rolling walker (2 wheels) ?Gait Pattern/deviations: Step-to pattern, Shuffle, Decreased dorsiflexion - left, Decreased weight shift to left, Trunk flexed ?Gait velocity: decreased ?  ?  ?General Gait Details: Cues for heel strike at initial contact. slow pace with step to vs step through pattern. heavy use of arms on walker. supervision for safety ? ? ? ? ? ?  ?Balance Overall balance assessment: Needs assistance ?Sitting-balance support: No upper extremity supported, Feet supported ?Sitting balance-Leahy Scale: Good ?  ?  ?Standing balance support: Bilateral upper extremity supported, During functional activity ?Standing balance-Leahy Scale: Fair ?Standing balance comment: used RW for support ?  ? ?  ?Cognition Arousal/Alertness: Awake/alert ?Behavior During Therapy: [facility] for tasks assessed/performed ?Overall Cognitive Status: Within Functional Limits for tasks assessed ?  ?  ?  ?Problem Solving: Slow processing, Difficulty sequencing, Requires verbal cues ?General Comments: pt cooperative and has good participation in therapy session. ?  ?  ? ?  ?Exercises  verbal review for LE exercises including LAQ, SAQ, heel slides, and marching ? ?  ?General Comments General comments (skin integrity, edema, etc.): monitored vitals throughout session and HR stable with 110 bpm at rest and 115 with exertion ?  ?  ? ?Pertinent Vitals/Pain Pain  Assessment ?Pain Assessment: 0-10 ?Pain Score: 6  ?Pain Location: back, LLE ?Pain Descriptors /  Indicators: Sore ?Pain Intervention(s): Limited activity within patient's tolerance  ? ? ? ?PT Goals (current goals can now be found in the care plan section) Acute Rehab PT Goals ?PT Goal Formulation: With patient ?Time For Goal Achievement: 09/08/21 ?Potential to Achieve Goals: Good ?Progress towards PT goals: Progressing toward goals ? ?  ?Frequency ? ? ? Min 3X/week ? ? ? ?  ?PT Plan Current plan remains appropriate  ? ? ?   ?AM-PAC PT "6 Clicks" Mobility   ?Outcome Measure ? Help needed turning from your back to your side while in a flat bed without using bedrails?: A Little ?Help needed moving from lying on your back to sitting on the side of a flat bed without using bedrails?: A Little ?Help needed moving to and from a bed to a chair (including a wheelchair)?: A Little ?Help needed standing up from a chair using your arms (e.g., wheelchair or bedside chair)?: A Little ?Help needed to walk in hospital room?: A Little ?Help needed climbing 3-5 steps with a railing? : A Lot ?6 Click Score: 17 ? ?  ?End of Session Equipment Utilized During Treatment: Gait belt ?Activity Tolerance: Patient tolerated treatment well ?Patient left: in chair;with call bell/phone within reach;with chair alarm set ?Nurse Communication: Mobility status ?PT Visit Diagnosis: Other abnormalities of gait and mobility (R26.89);Muscle weakness (generalized) (M62.81);Difficulty in walking, not elsewhere classified (R26.2);Unsteadiness on feet (R26.81);Pain ?Pain - Right/Left: Left ?Pain - part of body: Leg ?  ? ? ?Time: 1452 [phone] ?PT Time Calculation (min) (ACUTE ONLY): 40 min ? ?Charges:  $Gait Training: 23-37 mins ?$Therapeutic Activity: 8-22 mins          ?          ? ?Otilio Jefferson, SPTA ? ? ? ?Tiffany Mutch ?09/07/2021, 4:38 PM ? ?

## 2021-09-08 DIAGNOSIS — R7881 Bacteremia: Secondary | ICD-10-CM | POA: Diagnosis not present

## 2021-09-08 DIAGNOSIS — B9562 Methicillin resistant Staphylococcus aureus infection as the cause of diseases classified elsewhere: Secondary | ICD-10-CM | POA: Diagnosis not present

## 2021-09-08 NOTE — Progress Notes (Signed)
Mobility Specialist Progress Note ? ? 09/08/21 1900  ?Mobility  ?Activity Ambulated with assistance in room  ?Level of Assistance Minimal assist, patient does 75% or more  ?Assistive Device  ?(HHA)  ?Distance Ambulated (ft) 20 ft  ?Activity Response Tolerated well  ?$Mobility charge 1 Mobility  ? ?Received in bed w/ no complaints and agreeable. Focused today on progressing ambulation w/o AD, requiring HHA + surrounding environment (sink, chair, door). Also requiring min encouragement because of self doubt. Returned back to bed w/o fault and left call bell in reach.   ? ?Frederico Hamman ?Mobility Specialist ?Phone Number [phone] ? ?

## 2021-09-08 NOTE — Progress Notes (Signed)
?PROGRESS NOTE ? ? ? ?Latoya [name]  OZH:[id number] DOB: 1985-12-21 DOA: 08/02/2021 ?PCP: Pcp, No  ?Chief Complaint  ?Patient presents with  ? Drug Problem  ? Back Pain  ? Leg Pain  ? Dysuria  ? ? ?Brief Narrative:  ?Patient is Latoya [name] 36 years old female with past medical history of MSSA tricuspid valve endocarditis in 2020 as well as MRSA discitis and osteomyelitis in 2021 with ongoing IV drug abuse presented to hospital with low back pain, cough and fever.  MRI of the lumbar spine was negative for infection but blood culture was positive for MRSA bacteremia with tricuspid valve vegetation and septic pulmonary emboli.  Patient was seen by CT surgery and underwent angio VAC  procedure, but the discovery of Edith Groleau significant PFO required the abortion of this procedure.  She has been seen by IDA and plan is to continue antibiotic until 09/16/2021 and to repeat TTE about 5 to 7 days prior to discontinuation of antibiotics.  ? ? ?Assessment & Plan: ?  ?Principal Problem: ?  MRSA bacteremia ?Active Problems: ?  Endocarditis of tricuspid valve ?  History of bacterial endocarditis ?  Septic pulmonary embolism ([place]) ?  Opioid use disorder ?  IV drug abuse ([place]) ?  Chronic lumbar pain ?  Iron deficiency anemia ?  Ambulatory dysfunction ?  Chronic pain ?  History of lice ? ? ?Assessment and Plan: ?* MRSA bacteremia ?Treating bacteremia as above ? ?Septic pulmonary embolism ([place]) ?With small bilateral effusions ?Follow CXR intermittent ? ?History of bacterial endocarditis ?TV in 2020, MSSA, got angiovac at [facility]. Treated with Ancef at that time. 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 ?-- as above ? ?Endocarditis of tricuspid valve ?Patient presenting to the ED with back pain and fevers in the setting of continued IV drug abuse.  Met criteria for severe sepsis with endorgan failure and hypotension on admission complicated by pulmonary septic emboli and MRSA bacteremia with  tricuspid valve endocarditis on echocardiogram. ?--Infectious disease following, appreciate assistance ?--CT surgery c/s, underwent angio vac, but procedure was aborted due to positive bubble study concerning for PFO with further investigation revealing connection between R and L atrium with flows from R to L ?--last ID note I see from 4/12? (recommending 6 weeks from negative culture, EOT 5/13, not home abx candidate, follow up appt 5/15) ?--initial blood cultures from 3/29 with MRSA.  repeat blood cultures 4/2; 1/4 positive for Staph epidermidis, likely contaminant; otherwise remaining bottles no growth x5 days ?--Continue vancomycin, pharmacy consulted for dosing/monitoring ?--planning for repeat TTE 5-7 days prior to d/c abx ? ?IV drug abuse ([place]) ?Counseled on need for complete cessation given her recurrent tricuspid valve endocarditis now with MRSA ? ?Opioid use disorder ?Avoid IV abx ?Could consider suboxone, though need follow up and would need her to be agreeable with induction here in hospital.  No decision on this yet.  Currently on oxycodone prn. ? ? ?Chronic lumbar pain ?MR T and L spine unremarkable ? ?Chronic pain ?Started on baclofen during this admission, will taper this to off, may need to be discharged with Ameyah Bangura few pills ?Will continue cymbalta ?Schedule tylenol ?Trial lidocaine patch for low back pain ?Trial voltaren gel for lower extremity pain ?Was started on lyrica, it looks like during this hospitalization as well.  i'm not sure about this long term, will consider tapering (but since already tapering baclofen, will continue to discuss). ?Oxycodone prn ? ? ? ?Ambulatory  dysfunction ?Continue PT/OT ? ?Iron deficiency anemia ?Iron panel with serum iron of 22, transferrin saturation 7, ferritin 51 and TIBC 312. ?--Ferrous gluconate 324 mg p.o. daily ? ?History of lice ?Not POA, likely brought in from outside visitors ?Patient completed permethrin on 08/31/2021 ?Unfortunately patient's belongings  were placed into plastic bags but were not treated at that time, have instructed patient that she is not open these bags while in the hospital as they are considered contaminated ? ?Hyponatremia-resolved as of 08/27/2021 ?resolved ? ? ? ?DVT prophylaxis: xarelto ?Code Status: full ?Family Communication: none ?Disposition:  ? ?Status is: Inpatient ?Remains inpatient appropriate because: need for iv abx ?  ?Consultants:  ?ID ?Ct surgery ?cardiology ? ?Procedures:  ?Echo ?IMPRESSIONS  ? ? ? 1. Cannot R/O small oscillating density on TV; suggest TEE to further  ?assess.  ? 2. Left ventricular ejection fraction, by estimation, is 60 to 65%. The  ?left ventricle has normal function. The left ventricle has no regional  ?wall motion abnormalities. Left ventricular diastolic parameters were  ?normal.  ? 3. Right ventricular systolic function is normal. The right ventricular  ?size is normal. There is normal pulmonary artery systolic pressure.  ? 4. The mitral valve is normal in structure. No evidence of mitral valve  ?regurgitation. No evidence of mitral stenosis.  ? 5. The aortic valve is tricuspid. Aortic valve regurgitation is not  ?visualized. No aortic stenosis is present.  ? 6. The inferior vena cava is normal in size with greater than 50%  ?respiratory variability, suggesting right atrial pressure of 3 mmHg.  ? ?Comparison(s): No prior Echocardiogram.  ? ?TEE ?IMPRESSIONS  ? ? ? 1. Left ventricular ejection fraction, by estimation, is 65 to 70%. The  ?left ventricle has normal function.  ? 2. Right ventricular systolic function is mildly reduced. The right  ?ventricular size is mildly enlarged.  ? 3. Left atrial size was mildly dilated. No left atrial/left atrial  ?appendage thrombus was detected.  ? 4. Right atrial size was moderately dilated.  ? 5. The mitral valve is normal in structure. Trivial mitral valve  ?regurgitation.  ? 6. Large highly mobile 1.8 cm vegetation seems to arise from lateral cusp  ?of TV . The  tricuspid valve is abnormal. Tricuspid valve regurgitation is  ?severe.  ? 7. The aortic valve is tricuspid. Aortic valve regurgitation is not  ?visualized. No aortic stenosis is present.  ? ?Antimicrobials:  ?Anti-infectives (From admission, onward)  ? ? Start     Dose/Rate Route Frequency Ordered Stop  ? 08/29/21 2200  vancomycin (VANCOREADY) IVPB 1750 mg/350 mL       ? 1,750 mg ?175 mL/hr over 120 Minutes Intravenous Every 24 hours 08/29/21 1121    ? 08/21/21 1800  vancomycin (VANCOREADY) IVPB 750 mg/150 mL  Status:  Discontinued       ? 750 mg ?150 mL/hr over 60 Minutes Intravenous Every 12 hours 08/21/21 1344 08/21/21 1347  ? 08/21/21 1800  vancomycin (VANCOCIN) IVPB 1000 mg/200 mL premix  Status:  Discontinued       ? 1,000 mg ?200 mL/hr over 60 Minutes Intravenous Every 12 hours 08/21/21 1347 08/29/21 1121  ? 08/15/21 0600  vancomycin (VANCOCIN) IVPB 1000 mg/200 mL premix  Status:  Discontinued       ? 1,000 mg ?200 mL/hr over 60 Minutes Intravenous Every 12 hours 08/14/21 2006 08/21/21 1344  ? 08/09/21 1000  vancomycin (VANCOREADY) IVPB 750 mg/150 mL  Status:  Discontinued       ?  750 mg ?150 mL/hr over 60 Minutes Intravenous Every 8 hours 08/09/21 0237 08/14/21 2006  ? 08/08/21 1700  vancomycin (VANCOREADY) IVPB 1500 mg/300 mL  Status:  Discontinued       ? 750 mg ?75 mL/hr over 120 Minutes Intravenous Every 8 hours 08/08/21 0920 08/09/21 0237  ? 08/05/21 1800  vancomycin (VANCOREADY) IVPB 1500 mg/300 mL  Status:  Discontinued       ? 1,500 mg ?150 mL/hr over 120 Minutes Intravenous Every 12 hours 08/05/21 0618 08/08/21 0920  ? 08/04/21 1700  vancomycin (VANCOREADY) IVPB 1250 mg/250 mL  Status:  Discontinued       ? 1,250 mg ?166.7 mL/hr over 90 Minutes Intravenous Every 12 hours 08/04/21 1613 08/05/21 0618  ? 08/03/21 1700  vancomycin (VANCOCIN) IVPB 1000 mg/200 mL premix  Status:  Discontinued       ? 1,000 mg ?200 mL/hr over 60 Minutes Intravenous Every 12 hours 08/03/21 1039 08/03/21 1042  ? 08/03/21  1700  Vancomycin (VANCOCIN) 1,250 mg in sodium chloride 0.9 % 250 mL IVPB  Status:  Discontinued       ? 1,250 mg ?166.7 mL/hr over 90 Minutes Intravenous Every 12 hours 08/03/21 1042 08/03/21 1111  ? 08/03/21

## 2021-09-09 DIAGNOSIS — R7881 Bacteremia: Secondary | ICD-10-CM | POA: Diagnosis not present

## 2021-09-09 DIAGNOSIS — B9562 Methicillin resistant Staphylococcus aureus infection as the cause of diseases classified elsewhere: Secondary | ICD-10-CM | POA: Diagnosis not present

## 2021-09-09 MED ORDER — PERMETHRIN 1 % EX LOTN
TOPICAL_LOTION | CUTANEOUS | Status: DC
  Filled 2021-09-09: qty 59

## 2021-09-09 MED ORDER — PERMETHRIN 5 % EX CREA
TOPICAL_CREAM | Freq: Once | CUTANEOUS | Status: DC
  Filled 2021-09-09: qty 60

## 2021-09-09 NOTE — Progress Notes (Signed)
PT Cancellation Note ? ?Patient Details ?Name: Latoya [name] ?MRN: [id number] ?DOB: 14-Jan-1986 ? ? ?Cancelled Treatment:    Reason Eval/Treat Not Completed: (P) Patient at procedure or test/unavailable Pt is in shower when PT arrived. PT will follow back on Monday. ? ?Ramisa Duman B. Beverely Risen PT, DPT ?Acute Rehabilitation Services ?Please use secure chat or  ?Call Office [phone] ? ?[name] ?09/09/2021, 5:04 PM ? ? ?

## 2021-09-09 NOTE — Progress Notes (Signed)
?PROGRESS NOTE ? ? ? ?Latoya [name]  OZH:[id number] DOB: 1986/02/20 DOA: 08/02/2021 ?PCP: Pcp, No  ?Chief Complaint  ?Patient presents with  ? Drug Problem  ? Back Pain  ? Leg Pain  ? Dysuria  ? ? ?Brief Narrative:  ?Patient is Latoya [name] 36 years old female with past medical history of MSSA tricuspid valve endocarditis in 2020 as well as MRSA discitis and osteomyelitis in 2021 with ongoing IV drug abuse presented to hospital with low back pain, cough and fever.  MRI of the lumbar spine was negative for infection but blood culture was positive for MRSA bacteremia with tricuspid valve vegetation and septic pulmonary emboli.  Patient was seen by CT surgery and underwent angio VAC  procedure, but the discovery of Latoya [name] significant PFO required the abortion of this procedure.  She has been seen by IDA and plan is to continue antibiotic until 09/16/2021 and to repeat TTE about 5 to 7 days prior to discontinuation of antibiotics.  ? ? ?Assessment & Plan: ?  ?Principal Problem: ?  MRSA bacteremia ?Active Problems: ?  Endocarditis of tricuspid valve ?  History of bacterial endocarditis ?  Septic pulmonary embolism (Latoya [name]) ?  Opioid use disorder ?  IV drug abuse (Latoya [name]) ?  Chronic lumbar pain ?  Iron deficiency anemia ?  Ambulatory dysfunction ?  Chronic pain ?  History of lice ? ? ?Assessment and Plan: ?* MRSA bacteremia ?Treating bacteremia as above ? ?Septic pulmonary embolism (Latoya [name]) ?With small bilateral effusions ?Follow CXR intermittent ? ?History of bacterial endocarditis ?TV in 2020, MSSA, [facility]ac [facility]. Treated with Ancef at that time. 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 ?-- as above ? ?Endocarditis of tricuspid valve ?Patient presenting to the ED with back pain and fevers in the setting of continued IV drug abuse.  Met criteria for severe sepsis with endorgan failure and hypotension on admission complicated by pulmonary septic emboli and MRSA bacteremia with  tricuspid valve endocarditis on echocardiogram. ?--Infectious disease following, appreciate assistance ?--CT surgery c/s, underwent angio vac, but procedure was aborted due to positive bubble study concerning for PFO with further investigation revealing connection between R and L atrium with flows from R to L ?--last ID note I see from 4/12? (recommending 6 weeks from negative culture, EOT 5/13, not home abx candidate, follow up appt 5/15) ?--initial blood cultures from 3/29 with MRSA.  repeat blood cultures 4/2; 1/4 positive for Staph epidermidis, likely contaminant; otherwise remaining bottles no growth x5 days ?--Continue vancomycin, pharmacy consulted for dosing/monitoring ?--planning for repeat TTE 5-7 days prior to d/c abx ? ?IV drug abuse (Latoya [name]) ?Counseled on need for complete cessation given her recurrent tricuspid valve endocarditis now with MRSA ? ?Opioid use disorder ?Avoid IV abx ?Could consider suboxone, though need follow up and would need her to be agreeable with induction here in hospital.  No decision on this yet.  Currently on oxycodone prn. ? ? ?Chronic lumbar pain ?MR T and L spine unremarkable ? ?Chronic pain ?Started on baclofen during this admission, will taper this to off, may need to be discharged with Latoya [name] few pills ?Will continue cymbalta ?Schedule tylenol ?Trial lidocaine patch for low back pain ?Trial voltaren gel for lower extremity pain ?Was started on lyrica, it looks like during this hospitalization as well.  i'm not sure about this long term, will consider tapering (but since already tapering baclofen, will continue to discuss). ?Oxycodone prn ? ? ? ?Ambulatory  dysfunction ?Continue PT/OT ? ?Iron deficiency anemia ?Iron panel with serum iron of 22, transferrin saturation 7, ferritin 51 and TIBC 312. ?--Ferrous gluconate 324 mg p.o. daily ? ?History of lice ?Not POA, likely brought in from outside visitors ?Patient completed permethrin on 08/31/2021 ?Unfortunately patient's belongings  were placed into plastic bags but were not treated at that time, have instructed patient that she is not open these bags while in the hospital as they are considered contaminated ? ?Hyponatremia-resolved as of 08/27/2021 ?resolved ? ? ? ?DVT prophylaxis: xarelto ?Code Status: full ?Family Communication: none ?Disposition:  ? ?Status is: Inpatient ?Remains inpatient appropriate because: need for iv abx ?  ?Consultants:  ?ID ?Ct surgery ?cardiology ? ?Procedures:  ?Echo ?IMPRESSIONS  ? ? ? 1. Cannot R/O small oscillating density on TV; suggest TEE to further  ?assess.  ? 2. Left ventricular ejection fraction, by estimation, is 60 to 65%. The  ?left ventricle has normal function. The left ventricle has no regional  ?wall motion abnormalities. Left ventricular diastolic parameters were  ?normal.  ? 3. Right ventricular systolic function is normal. The right ventricular  ?size is normal. There is normal pulmonary artery systolic pressure.  ? 4. The mitral valve is normal in structure. No evidence of mitral valve  ?regurgitation. No evidence of mitral stenosis.  ? 5. The aortic valve is tricuspid. Aortic valve regurgitation is not  ?visualized. No aortic stenosis is present.  ? 6. The inferior vena cava is normal in size with greater than 50%  ?respiratory variability, suggesting right atrial pressure of 3 mmHg.  ? ?Comparison(s): No prior Echocardiogram.  ? ?TEE ?IMPRESSIONS  ? ? ? 1. Left ventricular ejection fraction, by estimation, is 65 to 70%. The  ?left ventricle has normal function.  ? 2. Right ventricular systolic function is mildly reduced. The right  ?ventricular size is mildly enlarged.  ? 3. Left atrial size was mildly dilated. No left atrial/left atrial  ?appendage thrombus was detected.  ? 4. Right atrial size was moderately dilated.  ? 5. The mitral valve is normal in structure. Trivial mitral valve  ?regurgitation.  ? 6. Large highly mobile 1.8 cm vegetation seems to arise from lateral cusp  ?of TV . The  tricuspid valve is abnormal. Tricuspid valve regurgitation is  ?severe.  ? 7. The aortic valve is tricuspid. Aortic valve regurgitation is not  ?visualized. No aortic stenosis is present.  ? ?Antimicrobials:  ?Anti-infectives (From admission, onward)  ? ? Start     Dose/Rate Route Frequency Ordered Stop  ? 08/29/21 2200  vancomycin (VANCOREADY) IVPB 1750 mg/350 mL       ? 1,750 mg ?175 mL/hr over 120 Minutes Intravenous Every 24 hours 08/29/21 1121    ? 08/21/21 1800  vancomycin (VANCOREADY) IVPB 750 mg/150 mL  Status:  Discontinued       ? 750 mg ?150 mL/hr over 60 Minutes Intravenous Every 12 hours 08/21/21 1344 08/21/21 1347  ? 08/21/21 1800  vancomycin (VANCOCIN) IVPB 1000 mg/200 mL premix  Status:  Discontinued       ? 1,000 mg ?200 mL/hr over 60 Minutes Intravenous Every 12 hours 08/21/21 1347 08/29/21 1121  ? 08/15/21 0600  vancomycin (VANCOCIN) IVPB 1000 mg/200 mL premix  Status:  Discontinued       ? 1,000 mg ?200 mL/hr over 60 Minutes Intravenous Every 12 hours 08/14/21 2006 08/21/21 1344  ? 08/09/21 1000  vancomycin (VANCOREADY) IVPB 750 mg/150 mL  Status:  Discontinued       ?  750 mg ?150 mL/hr over 60 Minutes Intravenous Every 8 hours 08/09/21 0237 08/14/21 2006  ? 08/08/21 1700  vancomycin (VANCOREADY) IVPB 1500 mg/300 mL  Status:  Discontinued       ? 750 mg ?75 mL/hr over 120 Minutes Intravenous Every 8 hours 08/08/21 0920 08/09/21 0237  ? 08/05/21 1800  vancomycin (VANCOREADY) IVPB 1500 mg/300 mL  Status:  Discontinued       ? 1,500 mg ?150 mL/hr over 120 Minutes Intravenous Every 12 hours 08/05/21 0618 08/08/21 0920  ? 08/04/21 1700  vancomycin (VANCOREADY) IVPB 1250 mg/250 mL  Status:  Discontinued       ? 1,250 mg ?166.7 mL/hr over 90 Minutes Intravenous Every 12 hours 08/04/21 1613 08/05/21 0618  ? 08/03/21 1700  vancomycin (VANCOCIN) IVPB 1000 mg/200 mL premix  Status:  Discontinued       ? 1,000 mg ?200 mL/hr over 60 Minutes Intravenous Every 12 hours 08/03/21 1039 08/03/21 1042  ? 08/03/21  1700  Vancomycin (VANCOCIN) 1,250 mg in sodium chloride 0.9 % 250 mL IVPB  Status:  Discontinued       ? 1,250 mg ?166.7 mL/hr over 90 Minutes Intravenous Every 12 hours 08/03/21 1042 08/03/21 1111  ? 08/03/21

## 2021-09-09 NOTE — Progress Notes (Signed)
Pt just shower/changed her bed was performed.  She was noted with live lice crawling on her face/body, in bed and under mattress.  She also has a large matted area on her scalp that has not been treated because it cant be combed.   ? ?

## 2021-09-10 DIAGNOSIS — B9562 Methicillin resistant Staphylococcus aureus infection as the cause of diseases classified elsewhere: Secondary | ICD-10-CM | POA: Diagnosis not present

## 2021-09-10 DIAGNOSIS — R7881 Bacteremia: Secondary | ICD-10-CM | POA: Diagnosis not present

## 2021-09-10 MED ORDER — PERMETHRIN 1 % EX LOTN
TOPICAL_LOTION | Freq: Once | CUTANEOUS | Status: DC
  Filled 2021-09-10 (×2): qty 59

## 2021-09-10 MED ORDER — OXYCODONE HCL 5 MG PO TABS
7.5000 mg | ORAL_TABLET | Freq: Four times a day (QID) | ORAL | Status: DC | PRN
  Administered 2021-09-10 – 2021-09-17 (×27): 7.5 mg via ORAL
  Filled 2021-09-10 (×27): qty 2

## 2021-09-10 NOTE — Progress Notes (Signed)
Patient refusing topical lotion for lice treatment despite RN asking multiple times throughout the day and patient being agreeable to the lotion at times.  ?

## 2021-09-10 NOTE — Progress Notes (Signed)
Pt refused lice treatment despite education regarding worsening of condition and risks to staff and others. Pt states that no lice was found in her room throughout the day and that she knows she does not have body lice because she has not been itching and does not have any visible marks on her body. Asked if she would be willing to have hair cut down to access full scalp but she refused stating that once she is discharged she will be able to have her hair cut around the large matted areas but she does not want to lose her hair over suspected lice.  ? ?Pt assisted to bathroom x1. Bed pads changed. Unopened syringe and naloxone vial observed lying under dirty pad. Both removed and discarded appropriately.  ?

## 2021-09-10 NOTE — Progress Notes (Signed)
Pharmacy Antibiotic Note ? ?Latoya [name] is a 36 y.o. female admitted on 08/02/2021 with MRSA bacteremia-  septic pulmonary emboli/endocarditis. Pharmacy has been consulted for Vancomycin dosing. Plans noted to continue antibiotics until 5/13 with repeat TTE 5-7 days before stop date. Patient is not a candidate for OPAT. It has been 6 days since last vancomycin level, so will ordered levels for tomorrow. ? ?SCr remains stable.  ?Vancomycin levels checked 5/1 with no changes needed ? ?Plan: ?-Continue vancomycin 1750mg  IV q24h through 5/13 ?-Recheck Vancomycin levels 5/8  ? ? ?Height: 5\' 6"  (167.6 cm) ?Weight: 87.5 kg (192 lb 14.4 oz) ?IBW/kg (Calculated) : 59.3 ? ?Temp (24hrs), Avg:97.9 ?F (36.6 ?C), Min:97.8 ?F (36.6 ?C), Max:98.2 ?F (36.8 ?C) ? ?Recent Labs  ?Lab 09/03/21 ?2130 09/04/21 ?0419 09/06/21 ?0430 09/07/21 ?0440  ?WBC  --  11.7*  --  10.5  ?CREATININE  --   --  0.58 0.62  ?VANCOTROUGH 5*  --   --   --   ? ?  ?Estimated Creatinine Clearance: 110.4 mL/min (by C-G formula based on SCr of 0.62 mg/dL).   ? ?Allergies  ?Allergen Reactions  ? Naltrexone Anxiety  ?  Severe anxiety, restlessness, vomiting  ? ? ?Antimicrobials this admission: ?Cefepime 3/29 >> 3/30 ?Flagyl 3/29 >> 3/30 ?Vancomycin 3/29 >> ? ?Dose adjustments this admission: ?Dose adjustments this admission: ?3/30 Vanc 750 >> 1250 q12 w/ new wt and improving SCr  ? ?4/1: VTr 9; VPk 21, calc AUC 428- technically at goal but since endocarditits will Inc 1500mg  IV q12h to target higher AUC/Cmin ?4/3-4/4: VT 14, VP 38- AUC 697- Adj to 750 q 8  ?4/10: VP 29, VT 15, AUC 591.8, Cmin 12.5 >> 1gm IV q12h for calculated AUC 525.5 ?4/16- VP 30 mcg/mL drawn at 2128 (4/16) ?Dose admin time: 1722 (4/16) over 60 min ?VT 14 drawn 0530  AUC cal 600  ?No change 1gm q12  ?4/24 VP 38 (~1 hr late), VT 16 -AUC 655 -Adj to 1750 q24h ?4/30 VP 45/VT 5 > AUC 461, con't 1750 q 24 hrs ? ?Microbiology results: ?3/29 Influenza/COVID>> neg ?3/29 MRSA PCR >> positive ?3/29 BCx  >> GPC in 4/4 bottles (BCID MRSA) ?3/31 BCX: MRSA - only 1 set drawn  ?4/2 BCx: NG ? ?Thank you for allowing pharmacy to participate in this patient's care. ? ?4/29, PharmD ?[id number] Pharmacy Resident ?09/10/2021 9:37 AM ?Check [email or web address] for unit specific pharmacy number ? ? ? ? ? ? ? ?

## 2021-09-10 NOTE — Progress Notes (Signed)
Mobility Specialist Progress Note: ? ? 09/10/21 1400  ?Mobility  ?Activity Ambulated with assistance in room  ?Level of Assistance Minimal assist, patient does 75% or more  ?Assistive Device Front wheel walker  ?Distance Ambulated (ft) 50 ft  ?Activity Response Tolerated well  ?$Mobility charge 1 Mobility  ? ?Pt received agreeable to mobility session after encouragement. Required minG for safety throughout ambulation with RW. MinA to assist BLE onto bed at end of session. Pt left with all needs met, meal tray in room.  ? ?Nelta Numbers ?Acute Rehab ?Phone: [phone] ?Office Phone: [phone] ? ?

## 2021-09-10 NOTE — Progress Notes (Signed)
?PROGRESS NOTE ? ? ? ?Latoya [name]  DGL:[id number] DOB: 1985-07-06 DOA: 08/02/2021 ?PCP: Pcp, No  ?Chief Complaint  ?Patient presents with  ? Drug Problem  ? Back Pain  ? Leg Pain  ? Dysuria  ? ? ?Brief Narrative:  ?Patient is Latoya [name] 36 years old female with past medical history of MSSA tricuspid valve endocarditis in 2020 as well as MRSA discitis and osteomyelitis in 2021 with ongoing IV drug abuse presented to hospital with low back pain, cough and fever.  MRI of the lumbar spine was negative for infection but blood culture was positive for MRSA bacteremia with tricuspid valve vegetation and septic pulmonary emboli.  Patient was seen by CT surgery and underwent angio VAC  procedure, but the discovery of Latoya [name] significant PFO required the abortion of this procedure.  She has been seen by IDA and plan is to continue antibiotic until 09/16/2021 and to repeat TTE about 5 to 7 days prior to discontinuation of antibiotics.  ? ? ?Assessment & Plan: ?  ?Principal Problem: ?  MRSA bacteremia ?Active Problems: ?  Endocarditis of tricuspid valve ?  History of bacterial endocarditis ?  Septic pulmonary embolism ([place]) ?  Opioid use disorder ?  IV drug abuse ([place]) ?  Chronic lumbar pain ?  Iron deficiency anemia ?  Ambulatory dysfunction ?  Chronic pain ?  History of lice ? ? ?Assessment and Plan: ?* MRSA bacteremia ?Treating bacteremia as above ? ?Septic pulmonary embolism ([place]) ?With small bilateral effusions ?Follow CXR intermittent ? ?History of bacterial endocarditis ?TV in 2020, MSSA, got angiovac at [facility]. Treated with Ancef at that time. 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 ?-- as above ? ?Endocarditis of tricuspid valve ?Patient presenting to the ED with back pain and fevers in the setting of continued IV drug abuse.  Met criteria for severe sepsis with endorgan failure and hypotension on admission complicated by pulmonary septic emboli and MRSA bacteremia with  tricuspid valve endocarditis on echocardiogram. ?--Infectious disease following, appreciate assistance ?--CT surgery c/s, underwent angio vac, but procedure was aborted due to positive bubble study concerning for PFO with further investigation revealing connection between R and L atrium with flows from R to L ?--last ID note I see from 4/12? (recommending 6 weeks from negative culture, EOT 5/13, not home abx candidate, follow up appt 5/15) ?--initial blood cultures from 3/29 with MRSA.  repeat blood cultures 4/2; 1/4 positive for Staph epidermidis, likely contaminant; otherwise remaining bottles no growth x5 days ?--Continue vancomycin, pharmacy consulted for dosing/monitoring ?--planning for repeat TTE 5-7 days prior to d/c abx ? ?IV drug abuse ([place]) ?Counseled on need for complete cessation given her recurrent tricuspid valve endocarditis now with MRSA ? ?Opioid use disorder ?Avoid IV abx ?Could consider suboxone, though need follow up and would need her to be agreeable with induction here in hospital.  No decision on this yet.  Currently on oxycodone prn -> decrease this to 7.5 q6 prn. ? ? ?Chronic lumbar pain ?MR T and L spine unremarkable ? ?Chronic pain ?Started on baclofen during this admission, will taper this to off, may need to be discharged with Latoya [name] few pills ?Will continue cymbalta ?Schedule tylenol ?Trial lidocaine patch for low back pain ?Trial voltaren gel for lower extremity pain ?Was started on lyrica, it looks like during this hospitalization as well.  i'm not sure about this long term, will consider tapering (but since already tapering baclofen, will continue to  discuss). ?Oxycodone prn ? ? ? ?Ambulatory dysfunction ?Continue PT/OT ? ?Iron deficiency anemia ?Iron panel with serum iron of 22, transferrin saturation 7, ferritin 51 and TIBC 312. ?--Ferrous gluconate 324 mg p.o. daily ? ?History of lice ?Not POA, likely brought in from outside visitors ?Patient completed permethrin on 08/31/2021 ?Still  with lice on 5/6, will treat again ?follow ?Unfortunately patient's belongings were placed into plastic bags but were not treated at that time, have instructed patient that she is not open these bags while in the hospital as they are considered contaminated ? ?Hyponatremia-resolved as of 08/27/2021 ?resolved ? ? ? ?DVT prophylaxis: xarelto ?Code Status: full ?Family Communication: none ?Disposition:  ? ?Status is: Inpatient ?Remains inpatient appropriate because: need for iv abx ?  ?Consultants:  ?ID ?Ct surgery ?cardiology ? ?Procedures:  ?Echo ?IMPRESSIONS  ? ? ? 1. Cannot R/O small oscillating density on TV; suggest TEE to further  ?assess.  ? 2. Left ventricular ejection fraction, by estimation, is 60 to 65%. The  ?left ventricle has normal function. The left ventricle has no regional  ?wall motion abnormalities. Left ventricular diastolic parameters were  ?normal.  ? 3. Right ventricular systolic function is normal. The right ventricular  ?size is normal. There is normal pulmonary artery systolic pressure.  ? 4. The mitral valve is normal in structure. No evidence of mitral valve  ?regurgitation. No evidence of mitral stenosis.  ? 5. The aortic valve is tricuspid. Aortic valve regurgitation is not  ?visualized. No aortic stenosis is present.  ? 6. The inferior vena cava is normal in size with greater than 50%  ?respiratory variability, suggesting right atrial pressure of 3 mmHg.  ? ?Comparison(s): No prior Echocardiogram.  ? ?TEE ?IMPRESSIONS  ? ? ? 1. Left ventricular ejection fraction, by estimation, is 65 to 70%. The  ?left ventricle has normal function.  ? 2. Right ventricular systolic function is mildly reduced. The right  ?ventricular size is mildly enlarged.  ? 3. Left atrial size was mildly dilated. No left atrial/left atrial  ?appendage thrombus was detected.  ? 4. Right atrial size was moderately dilated.  ? 5. The mitral valve is normal in structure. Trivial mitral valve  ?regurgitation.  ? 6. Large  highly mobile 1.8 cm vegetation seems to arise from lateral cusp  ?of TV . The tricuspid valve is abnormal. Tricuspid valve regurgitation is  ?severe.  ? 7. The aortic valve is tricuspid. Aortic valve regurgitation is not  ?visualized. No aortic stenosis is present.  ? ?Antimicrobials:  ?Anti-infectives (From admission, onward)  ? ? Start     Dose/Rate Route Frequency Ordered Stop  ? 08/29/21 2200  vancomycin (VANCOREADY) IVPB 1750 mg/350 mL       ? 1,750 mg ?175 mL/hr over 120 Minutes Intravenous Every 24 hours 08/29/21 1121    ? 08/21/21 1800  vancomycin (VANCOREADY) IVPB 750 mg/150 mL  Status:  Discontinued       ? 750 mg ?150 mL/hr over 60 Minutes Intravenous Every 12 hours 08/21/21 1344 08/21/21 1347  ? 08/21/21 1800  vancomycin (VANCOCIN) IVPB 1000 mg/200 mL premix  Status:  Discontinued       ? 1,000 mg ?200 mL/hr over 60 Minutes Intravenous Every 12 hours 08/21/21 1347 08/29/21 1121  ? 08/15/21 0600  vancomycin (VANCOCIN) IVPB 1000 mg/200 mL premix  Status:  Discontinued       ? 1,000 mg ?200 mL/hr over 60 Minutes Intravenous Every 12 hours 08/14/21 2006 08/21/21 1344  ? 08/09/21 1000  vancomycin (VANCOREADY) IVPB 750 mg/150 mL  Status:  Discontinued       ? 750 mg ?150 mL/hr over 60 Minutes Intravenous Every 8 hours 08/09/21 0237 08/14/21 2006  ? 08/08/21 1700  vancomycin (VANCOREADY) IVPB 1500 mg/300 mL  Status:  Discontinued       ? 750 mg ?75 mL/hr over 120 Minutes Intravenous Every 8 hours 08/08/21 0920 08/09/21 0237  ? 08/05/21 1800  vancomycin (VANCOREADY) IVPB 1500 mg/300 mL  Status:  Discontinued       ? 1,500 mg ?150 mL/hr over 120 Minutes Intravenous Every 12 hours 08/05/21 0618 08/08/21 0920  ? 08/04/21 1700  vancomycin (VANCOREADY) IVPB 1250 mg/250 mL  Status:  Discontinued       ? 1,250 mg ?166.7 mL/hr over 90 Minutes Intravenous Every 12 hours 08/04/21 1613 08/05/21 0618  ? 08/03/21 1700  vancomycin (VANCOCIN) IVPB 1000 mg/200 mL premix  Status:  Discontinued       ? 1,000 mg ?200 mL/hr over  60 Minutes Intravenous Every 12 hours 08/03/21 1039 08/03/21 1042  ? 08/03/21 1700  Vancomycin (VANCOCIN) 1,250 mg in sodium chloride 0.9 % 250 mL IVPB  Status:  Discontinued       ? 1,250 mg ?166.7 mL/hr ov

## 2021-09-11 ENCOUNTER — Inpatient Hospital Stay (HOSPITAL_COMMUNITY): Payer: Medicaid Other

## 2021-09-11 DIAGNOSIS — B9562 Methicillin resistant Staphylococcus aureus infection as the cause of diseases classified elsewhere: Secondary | ICD-10-CM | POA: Diagnosis not present

## 2021-09-11 DIAGNOSIS — R7881 Bacteremia: Secondary | ICD-10-CM | POA: Diagnosis not present

## 2021-09-11 DIAGNOSIS — I38 Endocarditis, valve unspecified: Secondary | ICD-10-CM | POA: Diagnosis not present

## 2021-09-11 LAB — VANCOMYCIN, PEAK: Vancomycin Pk: 42 ug/mL — ABNORMAL HIGH (ref 30–40)

## 2021-09-11 LAB — ECHOCARDIOGRAM COMPLETE
AR max vel: 3.27 cm2
AV Area VTI: 2.87 cm2
AV Area mean vel: 3.14 cm2
AV Mean grad: 3 mmHg
AV Peak grad: 5.5 mmHg
Ao pk vel: 1.17 m/s
Area-P 1/2: 4.83 cm2
S' Lateral: 3.1 cm

## 2021-09-11 LAB — VANCOMYCIN, TROUGH: Vancomycin Tr: 5 ug/mL — ABNORMAL LOW (ref 15–20)

## 2021-09-11 MED ORDER — DOCUSATE SODIUM 100 MG PO CAPS
100.0000 mg | ORAL_CAPSULE | Freq: Two times a day (BID) | ORAL | Status: DC | PRN
  Administered 2021-09-11: 100 mg via ORAL
  Filled 2021-09-11: qty 1

## 2021-09-11 NOTE — Progress Notes (Signed)
?PROGRESS NOTE ? ? ? ?[name]  TWS:[id number] DOB: 05/11/1985 DOA: 08/02/2021 ?PCP: Pcp, No  ?Chief Complaint  ?Patient presents with  ? Drug Problem  ? Back Pain  ? Leg Pain  ? Dysuria  ? ? ?Brief Narrative:  ?Patient is Latoya [name] 36 years old female with past medical history of MSSA tricuspid valve endocarditis in 2020 as well as MRSA discitis and osteomyelitis in 2021 with ongoing IV drug abuse presented to hospital with low back pain, cough and fever.  MRI of the lumbar spine was negative for infection but blood culture was positive for MRSA bacteremia with tricuspid valve vegetation and septic pulmonary emboli.  Patient was seen by CT surgery and underwent angio VAC  procedure, but the discovery of [name] significant PFO required the abortion of this procedure.  She has been seen by IDA and plan is to continue antibiotic until 09/16/2021 and to repeat TTE about 5 to 7 days prior to discontinuation of antibiotics.  ? ? ?Assessment & Plan: ?  ?Principal Problem: ?  MRSA bacteremia ?Active Problems: ?  Endocarditis of tricuspid valve ?  History of bacterial endocarditis ?  Septic pulmonary embolism ([name]) ?  Opioid use disorder ?  IV drug abuse ([place]) ?  Chronic lumbar pain ?  Iron deficiency anemia ?  Ambulatory dysfunction ?  Chronic pain ?  History of lice ? ? ?Assessment and Plan: ?* MRSA bacteremia ?Treating bacteremia as above ? ?Septic pulmonary embolism ([name]) ?With small bilateral effusions ?Follow CXR intermittent ? ?History of bacterial endocarditis ?TV in 2020, MSSA, got angiovac at [facility]. Treated with Ancef at that time. 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 ?-- as above ? ?Endocarditis of tricuspid valve ?Patient presenting to the ED with back pain and fevers in the setting of continued IV drug abuse.  Met criteria for severe sepsis with endorgan failure and hypotension on admission complicated by pulmonary septic emboli and MRSA bacteremia with  tricuspid valve endocarditis on echocardiogram. ?--Infectious disease following, appreciate assistance ?--CT surgery c/s, underwent angio vac, but procedure was aborted due to positive bubble study concerning for PFO with further investigation revealing connection between R and L atrium with flows from R to L ?--last ID note I see from 4/12? (recommending 6 weeks from negative culture, EOT 5/13, not home abx candidate, follow up appt 5/15) ?--initial blood cultures from 3/29 with MRSA.  repeat blood cultures 4/2; 1/4 positive for Staph epidermidis, likely contaminant; otherwise remaining bottles no growth x5 days ?--Continue vancomycin, pharmacy consulted for dosing/monitoring ?--planning for repeat TTE 5-7 days prior to d/c abx (today) ? ?IV drug abuse ([place]) ?Counseled on need for complete cessation given her recurrent tricuspid valve endocarditis now with MRSA ? ?Opioid use disorder ?Avoid IV abx ?Could consider suboxone, though need follow up and would need her to be agreeable with induction here in hospital.  No decision on this yet.  Currently on oxycodone prn -> decrease this to 7.5 q6 prn. ? ? ?Chronic lumbar pain ?MR T and L spine unremarkable ? ?Chronic pain ?Started on baclofen during this admission, will taper this to off, may need to be discharged with [name] few pills ?Will continue cymbalta ?Schedule tylenol ?Trial lidocaine patch for low back pain ?Trial voltaren gel for lower extremity pain ?Was started on lyrica, it looks like during this hospitalization as well.  i'm not sure about this long term, will consider tapering (but since already tapering baclofen, will continue  to discuss). ?Oxycodone prn ? ? ? ?Ambulatory dysfunction ?Continue PT/OT ? ?Iron deficiency anemia ?Iron panel with serum iron of 22, transferrin saturation 7, ferritin 51 and TIBC 312. ?--Ferrous gluconate 324 mg p.o. daily ? ?History of lice ?Not POA, likely brought in from outside visitors ?Patient completed permethrin on  08/31/2021 ?Still with lice on 5/6, will treat again ?follow ?Unfortunately patient's belongings were placed into plastic bags but were not treated at that time, have instructed patient that she is not open these bags while in the hospital as they are considered contaminated ? ?Hyponatremia-resolved as of 08/27/2021 ?resolved ? ? ? ?DVT prophylaxis: xarelto ?Code Status: full ?Family Communication: none ?Disposition:  ? ?Status is: Inpatient ?Remains inpatient appropriate because: need for iv abx ?  ?Consultants:  ?ID ?Ct surgery ?cardiology ? ?Procedures:  ?Echo ?IMPRESSIONS  ? ? ? 1. Cannot R/O small oscillating density on TV; suggest TEE to further  ?assess.  ? 2. Left ventricular ejection fraction, by estimation, is 60 to 65%. The  ?left ventricle has normal function. The left ventricle has no regional  ?wall motion abnormalities. Left ventricular diastolic parameters were  ?normal.  ? 3. Right ventricular systolic function is normal. The right ventricular  ?size is normal. There is normal pulmonary artery systolic pressure.  ? 4. The mitral valve is normal in structure. No evidence of mitral valve  ?regurgitation. No evidence of mitral stenosis.  ? 5. The aortic valve is tricuspid. Aortic valve regurgitation is not  ?visualized. No aortic stenosis is present.  ? 6. The inferior vena cava is normal in size with greater than 50%  ?respiratory variability, suggesting right atrial pressure of 3 mmHg.  ? ?Comparison(s): No prior Echocardiogram.  ? ?TEE ?IMPRESSIONS  ? ? ? 1. Left ventricular ejection fraction, by estimation, is 65 to 70%. The  ?left ventricle has normal function.  ? 2. Right ventricular systolic function is mildly reduced. The right  ?ventricular size is mildly enlarged.  ? 3. Left atrial size was mildly dilated. No left atrial/left atrial  ?appendage thrombus was detected.  ? 4. Right atrial size was moderately dilated.  ? 5. The mitral valve is normal in structure. Trivial mitral valve   ?regurgitation.  ? 6. Large highly mobile 1.8 cm vegetation seems to arise from lateral cusp  ?of TV . The tricuspid valve is abnormal. Tricuspid valve regurgitation is  ?severe.  ? 7. The aortic valve is tricuspid. Aortic valve regurgitation is not  ?visualized. No aortic stenosis is present.  ? ?Antimicrobials:  ?Anti-infectives (From admission, onward)  ? ? Start     Dose/Rate Route Frequency Ordered Stop  ? 08/29/21 2200  vancomycin (VANCOREADY) IVPB 1750 mg/350 mL       ? 1,750 mg ?175 mL/hr over 120 Minutes Intravenous Every 24 hours 08/29/21 1121    ? 08/21/21 1800  vancomycin (VANCOREADY) IVPB 750 mg/150 mL  Status:  Discontinued       ? 750 mg ?150 mL/hr over 60 Minutes Intravenous Every 12 hours 08/21/21 1344 08/21/21 1347  ? 08/21/21 1800  vancomycin (VANCOCIN) IVPB 1000 mg/200 mL premix  Status:  Discontinued       ? 1,000 mg ?200 mL/hr over 60 Minutes Intravenous Every 12 hours 08/21/21 1347 08/29/21 1121  ? 08/15/21 0600  vancomycin (VANCOCIN) IVPB 1000 mg/200 mL premix  Status:  Discontinued       ? 1,000 mg ?200 mL/hr over 60 Minutes Intravenous Every 12 hours 08/14/21 2006 08/21/21 1344  ? 08/09/21  1000  vancomycin (VANCOREADY) IVPB 750 mg/150 mL  Status:  Discontinued       ? 750 mg ?150 mL/hr over 60 Minutes Intravenous Every 8 hours 08/09/21 0237 08/14/21 2006  ? 08/08/21 1700  vancomycin (VANCOREADY) IVPB 1500 mg/300 mL  Status:  Discontinued       ? 750 mg ?75 mL/hr over 120 Minutes Intravenous Every 8 hours 08/08/21 0920 08/09/21 0237  ? 08/05/21 1800  vancomycin (VANCOREADY) IVPB 1500 mg/300 mL  Status:  Discontinued       ? 1,500 mg ?150 mL/hr over 120 Minutes Intravenous Every 12 hours 08/05/21 0618 08/08/21 0920  ? 08/04/21 1700  vancomycin (VANCOREADY) IVPB 1250 mg/250 mL  Status:  Discontinued       ? 1,250 mg ?166.7 mL/hr over 90 Minutes Intravenous Every 12 hours 08/04/21 1613 08/05/21 0618  ? 08/03/21 1700  vancomycin (VANCOCIN) IVPB 1000 mg/200 mL premix  Status:  Discontinued        ? 1,000 mg ?200 mL/hr over 60 Minutes Intravenous Every 12 hours 08/03/21 1039 08/03/21 1042  ? 08/03/21 1700  Vancomycin (VANCOCIN) 1,250 mg in sodium chloride 0.9 % 250 mL IVPB  Status:  Discontinued       ? 1,250 mg ?166.7

## 2021-09-11 NOTE — Progress Notes (Signed)
?  Echocardiogram ?2D Echocardiogram has been performed. ? ?Gerda Diss ?09/11/2021, 3:39 PM ?

## 2021-09-11 NOTE — Progress Notes (Signed)
Pt refusing lice treatment due to large mass of knotted hair on her head.  Says meds cannot get to the source due to the condition of her hair and the combs we have provided for her are ineffective.  She says if I can get her a better brush she will give an honest effort to get the knot out, but she does not want it cut out.  I will see if I can get one at the gift shop downstairs.  MD made aware. ?

## 2021-09-11 NOTE — Progress Notes (Signed)
Pt's mother, Clydie Braun called concerned about pt's discharge disposition.  She is worried that her daughter will not be accepted into a drug rehabilitation facility due to needing assistance with ambulation.  She states that her daughter is homeless and she currently has custody of her two daughters. ? ?Pt's mother aware that disposition is largely dependent on PT recommendations.  I also plan to notify case mgt at 0900 progression meeting so they can follow up and discuss discharge planning. ?

## 2021-09-11 NOTE — Progress Notes (Signed)
Physical Therapy Treatment ?Patient Details ?Name: Latoya [name] ?MRN: [id number] ?DOB: 1985/07/05 ?Today's Date: 09/11/2021 ? ? ?History of Present Illness 36 y.o. female admitted 08/02/21 with back pain, hypotension, hyponatremia, leukocytosis and neutrophilia. Found to have bacteremia possible sepitc pulmonary emboli from endocarditis. PMH: IVDU, heroin abuse, ongoing. ? ?  ?PT Comments  ? ? Pt received seated EOB, agreeable to therapy session with goal of ambulation in room without AD. Attempted to have pt ambulate with +1 assist but she was unable to advance LLE without +2 hand-held assist. Pt needing up to +2 minA for transfers and gait 52ft in room with HHA and requesting to shower with RN assist at end of session to try to detangle hair/remove lice therefore distance limited as pt eager to shower and defers longer gait trial. Will plan to progress standing tolerance/gait with single point cane vs HHA next session and case manager Kelby Aline messaged to ask about disposition options (per chart review pt may need to be weaned from all AD in order to go to specific rehab facility). Pt continues to benefit from PT services to progress toward functional mobility goals.   ?Recommendations for follow up therapy are one component of a multi-disciplinary discharge planning process, led by the attending physician.  Recommendations may be updated based on patient status, additional functional criteria and insurance authorization. ? ?Follow Up Recommendations ?   ?  ?  ?Assistance Recommended at Discharge Intermittent Supervision/Assistance  ?Patient can return home with the following A little help with walking and/or transfers;A little help with bathing/dressing/bathroom;Assistance with cooking/housework;Direct supervision/assist for medications management;Assist for transportation;Help with stairs or ramp for entrance ?  ?Equipment Recommendations ? Rolling walker (2 wheels);BSC/3in1  ?  ?Recommendations for Other  Services   ? ? ?  ?Precautions / Restrictions Precautions ?Precautions: Fall ?Precaution Booklet Issued: No ?Precaution Comments: Contact precs, lice, back precs for comfort due to pain ?Required Braces or Orthoses: Spinal Brace ?Spinal Brace: Lumbar corset ?Restrictions ?Weight Bearing Restrictions: No  ?  ? ?Mobility ? Bed Mobility ?  ?  ?  ?  ?  ?  ?  ?General bed mobility comments: pt received seated EOB ?  ? ?Transfers ?Overall transfer level: Needs assistance ?Equipment used: Rolling walker (2 wheels) ?Transfers: Sit to/from Stand ?Sit to Stand: Min assist ?  ?  ?  ?  ?  ?General transfer comment: from EOB without AD, cues for body mechanics needed; Supervision for stand>sit to 3in1 in shower area with cues for hand placement ?  ? ?Ambulation/Gait ?Ambulation/Gait assistance: Min assist, +2 physical assistance ?Gait Distance (Feet): 20 Feet ?Assistive device: 2 person hand held assist ?Gait Pattern/deviations: Step-to pattern, Shuffle, Decreased dorsiflexion - left, Trunk flexed, Narrow base of support ?Gait velocity: decreased ?  ?  ?General Gait Details: Slow pace with step to vs step through pattern. Trial without AD and pt needing +2 minA for stability, pt unable to advance LLE without second person assist. Pt continues to have poor L heel strike and very limited L hip flexion/tending to shuffle this leg. ? ? ?Stairs ?  ?  ?  ?  ?  ? ? ?Wheelchair Mobility ?  ? ?Modified Rankin (Stroke Patients Only) ?  ? ? ?  ?Balance Overall balance assessment: Needs assistance ?Sitting-balance support: No upper extremity supported, Feet supported ?Sitting balance-Leahy Scale: Good ?  ?  ?Standing balance support: Single extremity supported ?Standing balance-Leahy Scale: Poor ?Standing balance comment: Encouraged her to attempt without RW, pt able to static  stand with +1 min guard but needing +2 minA for gait without RW ?  ?  ?  ?  ?  ?  ?  ?  ?  ?  ?  ?  ? ?  ?Cognition Arousal/Alertness: Awake/alert ?Behavior During  Therapy: [place] [facility] for tasks assessed/performed ?Overall Cognitive Status: Within Functional Limits for tasks assessed ?  ?  ?  ?  ?  ?  ?  ?  ?  ?  ?  ?  ?  ?  ?  ?Problem Solving: Slow processing, Requires verbal cues ?General Comments: pt cooperative and has good participation in therapy session, anxious to progress gait without AD ?  ?  ? ?  ?Exercises   ? ?  ?General Comments General comments (skin integrity, edema, etc.): VSS on RA prior to mobility and no acute s/sx distress during session; pt up in shower with monitor off at end of session in care of RN for showering. ?  ?  ? ?Pertinent Vitals/Pain Pain Assessment ?Pain Assessment: Faces ?Faces Pain Scale: Hurts a little bit ?Pain Location: back, LLE ?Pain Descriptors / Indicators: Sore ?Pain Intervention(s): Monitored during session, Premedicated before session, Repositioned  ? ? ? ?PT Goals (current goals can now be found in the care plan section) Acute Rehab PT Goals ?Patient Stated Goal: To improve, decreased back/sacral pain. ?PT Goal Formulation: With patient ?Progress towards PT goals: Progressing toward goals ? ?  ?Frequency ? ? ? Min 3X/week ? ? ? ?  ?PT Plan Current plan remains appropriate  ? ? ?   ?AM-PAC PT "6 Clicks" Mobility   ?Outcome Measure ? Help needed turning from your back to your side while in a flat bed without using bedrails?: A Little ?Help needed moving from lying on your back to sitting on the side of a flat bed without using bedrails?: A Little ?Help needed moving to and from a bed to a chair (including a wheelchair)?: A Little ?Help needed standing up from a chair using your arms (e.g., wheelchair or bedside chair)?: A Little ?Help needed to walk in hospital room?: A Little ?Help needed climbing 3-5 steps with a railing? : Total ?6 Click Score: 16 ? ?  ?End of Session Equipment Utilized During Treatment: Back brace ?Activity Tolerance: Patient tolerated treatment well ?Patient left: in chair;with call bell/phone within reach;Other  (comment);with nursing/sitter in room (pt up in shower on 3in1 chair, RN present) ?Nurse Communication: Mobility status ?PT Visit Diagnosis: Other abnormalities of gait and mobility (R26.89);Muscle weakness (generalized) (M62.81);Difficulty in walking, not elsewhere classified (R26.2);Unsteadiness on feet (R26.81);Pain ?Pain - Right/Left: Left ?Pain - part of body: Leg ?  ? ? ?Time: [id number] ?PT Time Calculation (min) (ACUTE ONLY): 12 min ? ?Charges:  $Gait Training: 8-22 mins          ?          ? ?Elaine Roanhorse P., PTA ?Acute Rehabilitation Services ?Secure Chat Preferred 9a-5:30pm ?Office: [phone]  ? ? ?Kara Pacer Kamaron Deskins ?09/11/2021, 6:46 PM ? ?

## 2021-09-11 NOTE — Progress Notes (Signed)
Pharmacy Antibiotic Note ? ?Latoya [name] is a 36 y.o. female admitted on 08/02/2021 with MRSA bacteremia-  septic pulmonary emboli/endocarditis. Pharmacy has been consulted for Vancomycin dosing. Plans noted to continue antibiotics until 5/13 with repeat TTE 5-7 days before stop date. Patient is not a candidate for OPAT. It has been 6 days since last vancomycin level, so will ordered levels for tomorrow. ? ?5/8 PM update:  ?AUC therapeutic at 445 ?Renal function stable ? ?Plan: ?-Continue vancomycin 1750mg  IV q24h through 5/13 ?-Re-check vancomycin levels as needed ? ? ?Height: 5\' 6"  (167.6 cm) ?Weight: 87.5 kg (192 lb 14.4 oz) ?IBW/kg (Calculated) : 59.3 ? ?Temp (24hrs), Avg:99.1 ?F (37.3 ?C), Min:98.1 ?F (36.7 ?C), Max:100.2 ?F (37.9 ?C) ? ?Recent Labs  ?Lab 09/06/21 ?0430 09/07/21 ?11/06/21 09/11/21 ?0106 09/11/21 ?2135  ?WBC  --  10.5  --   --   ?CREATININE 0.58 0.62  --   --   ?VANCOTROUGH  --   --   --  5*  ?VANCOPEAK  --   --  82*  --   ? ?  ?Estimated Creatinine Clearance: 110.4 mL/min (by C-G formula based on SCr of 0.62 mg/dL).   ? ?Allergies  ?Allergen Reactions  ? Naltrexone Anxiety  ?  Severe anxiety, restlessness, vomiting  ? ? ?Antimicrobials this admission: ?Cefepime 3/29 >> 3/30 ?Flagyl 3/29 >> 3/30 ?Vancomycin 3/29 >> ? ? ?Dose adjustments this admission: ?3/30 Vanc 750 >> 1250 q12 w/ new wt and improving SCr  ? ?4/1: VTr 9; VPk 21, calc AUC 428- technically at goal but since endocarditits will Inc 1500mg  IV q12h to target higher AUC/Cmin ?4/3-4/4: VT 14, VP 38- AUC 697- Adj to 750 q 8  ?4/10: VP 29, VT 15, AUC 591.8, Cmin 12.5 >> 1gm IV q12h for calculated AUC 525.5 ?4/16- VP 30 mcg/mL drawn at 2128 (4/16) ?Dose admin time: 1722 (4/16) over 60 min ?VT 14 drawn 0530  AUC cal 600  ?No change 1gm q12  ?4/24 VP 38 (~1 hr late), VT 16 -AUC 655 -Adj to 1750 q24h ?4/30 VP 45/VT 5 > AUC 461, con't 1750 q 24 hrs ?5/8 VP 42/VT 5 (AUC 445), cont 1750 mg IV q24h ? ?Microbiology results: ?3/29 Influenza/COVID>>  neg ?3/29 MRSA PCR >> positive ?3/29 BCx >> GPC in 4/4 bottles (BCID MRSA) ?3/31 BCX: MRSA - only 1 set drawn  ?4/2 BCx: NG ? ?4/29, PharmD, BCPS ?Clinical Pharmacist ?Phone: [phone] ? ? ? ? ? ? ? ? ?

## 2021-09-12 ENCOUNTER — Inpatient Hospital Stay (HOSPITAL_COMMUNITY): Payer: Medicaid Other

## 2021-09-12 DIAGNOSIS — J189 Pneumonia, unspecified organism: Secondary | ICD-10-CM | POA: Diagnosis not present

## 2021-09-12 DIAGNOSIS — A4102 Sepsis due to Methicillin resistant Staphylococcus aureus: Secondary | ICD-10-CM | POA: Diagnosis not present

## 2021-09-12 DIAGNOSIS — G9341 Metabolic encephalopathy: Secondary | ICD-10-CM | POA: Diagnosis not present

## 2021-09-12 DIAGNOSIS — E871 Hypo-osmolality and hyponatremia: Secondary | ICD-10-CM | POA: Diagnosis not present

## 2021-09-12 DIAGNOSIS — R092 Respiratory arrest: Secondary | ICD-10-CM | POA: Diagnosis not present

## 2021-09-12 DIAGNOSIS — I330 Acute and subacute infective endocarditis: Secondary | ICD-10-CM | POA: Diagnosis not present

## 2021-09-12 DIAGNOSIS — R918 Other nonspecific abnormal finding of lung field: Secondary | ICD-10-CM | POA: Diagnosis not present

## 2021-09-12 DIAGNOSIS — R079 Chest pain, unspecified: Secondary | ICD-10-CM | POA: Diagnosis not present

## 2021-09-12 DIAGNOSIS — Z20822 Contact with and (suspected) exposure to covid-19: Secondary | ICD-10-CM | POA: Diagnosis not present

## 2021-09-12 DIAGNOSIS — O0383 Metabolic disorder following complete or unspecified spontaneous abortion: Secondary | ICD-10-CM | POA: Diagnosis not present

## 2021-09-12 DIAGNOSIS — R7881 Bacteremia: Secondary | ICD-10-CM | POA: Diagnosis not present

## 2021-09-12 DIAGNOSIS — E860 Dehydration: Secondary | ICD-10-CM | POA: Diagnosis not present

## 2021-09-12 DIAGNOSIS — I2699 Other pulmonary embolism without acute cor pulmonale: Secondary | ICD-10-CM | POA: Diagnosis not present

## 2021-09-12 DIAGNOSIS — B9562 Methicillin resistant Staphylococcus aureus infection as the cause of diseases classified elsewhere: Secondary | ICD-10-CM | POA: Diagnosis not present

## 2021-09-12 DIAGNOSIS — I2690 Septic pulmonary embolism without acute cor pulmonale: Secondary | ICD-10-CM | POA: Diagnosis not present

## 2021-09-12 DIAGNOSIS — D638 Anemia in other chronic diseases classified elsewhere: Secondary | ICD-10-CM | POA: Diagnosis not present

## 2021-09-12 DIAGNOSIS — R911 Solitary pulmonary nodule: Secondary | ICD-10-CM | POA: Diagnosis not present

## 2021-09-12 LAB — COMPREHENSIVE METABOLIC PANEL
ALT: 11 U/L (ref 0–44)
AST: 8 U/L — ABNORMAL LOW (ref 15–41)
Albumin: 2.8 g/dL — ABNORMAL LOW (ref 3.5–5.0)
Alkaline Phosphatase: 67 U/L (ref 38–126)
Anion gap: 6 (ref 5–15)
BUN: 9 mg/dL (ref 6–20)
CO2: 24 mmol/L (ref 22–32)
Calcium: 8.8 mg/dL — ABNORMAL LOW (ref 8.9–10.3)
Chloride: 106 mmol/L (ref 98–111)
Creatinine, Ser: 0.63 mg/dL (ref 0.44–1.00)
GFR, Estimated: >60 mL/min (ref >60)
Glucose, Bld: 106 mg/dL — ABNORMAL HIGH (ref 70–99)
Potassium: 4 mmol/L (ref 3.5–5.1)
Sodium: 136 mmol/L (ref 135–145)
Total Bilirubin: 1 mg/dL (ref 0.3–1.2)
Total Protein: 7.4 g/dL (ref 6.5–8.1)

## 2021-09-12 LAB — SEDIMENTATION RATE: Sed Rate: 72 mm/hr — ABNORMAL HIGH (ref 0–22)

## 2021-09-12 LAB — CBC WITH DIFFERENTIAL/PLATELET
Abs Immature Granulocytes: 0.11 10*3/uL — ABNORMAL HIGH (ref 0.00–0.07)
Basophils Absolute: 0 10*3/uL (ref 0.0–0.1)
Basophils Relative: 0 %
Eosinophils Absolute: 0.3 10*3/uL (ref 0.0–0.5)
Eosinophils Relative: 2 %
HCT: 28.8 % — ABNORMAL LOW (ref 36.0–46.0)
Hemoglobin: 9.2 g/dL — ABNORMAL LOW (ref 12.0–15.0)
Immature Granulocytes: 1 %
Lymphocytes Relative: 16 %
Lymphs Abs: 2.6 10*3/uL (ref 0.7–4.0)
MCH: 24.1 pg — ABNORMAL LOW (ref 26.0–34.0)
MCHC: 31.9 g/dL (ref 30.0–36.0)
MCV: 75.6 fL — ABNORMAL LOW (ref 80.0–100.0)
Monocytes Absolute: 1.5 10*3/uL — ABNORMAL HIGH (ref 0.1–1.0)
Monocytes Relative: 9 %
Neutro Abs: 11.5 10*3/uL — ABNORMAL HIGH (ref 1.7–7.7)
Neutrophils Relative %: 72 %
Platelets: 204 10*3/uL (ref 150–400)
RBC: 3.81 MIL/uL — ABNORMAL LOW (ref 3.87–5.11)
RDW: 19.1 % — ABNORMAL HIGH (ref 11.5–15.5)
WBC: 16 10*3/uL — ABNORMAL HIGH (ref 4.0–10.5)
nRBC: 0 % (ref 0.0–0.2)

## 2021-09-12 LAB — C-REACTIVE PROTEIN: CRP: 24.1 mg/dL — ABNORMAL HIGH (ref <1.0)

## 2021-09-12 IMAGING — DX DG CHEST 1V PORT
1 series · 1 of 1 positions shown · non-contrast
Comparison: AP chest [DATE], CT chest [DATE], AP chest
[DATE]

CLINICAL DATA: Chest pain.

EXAM:
PORTABLE CHEST 1 VIEW

[chest]
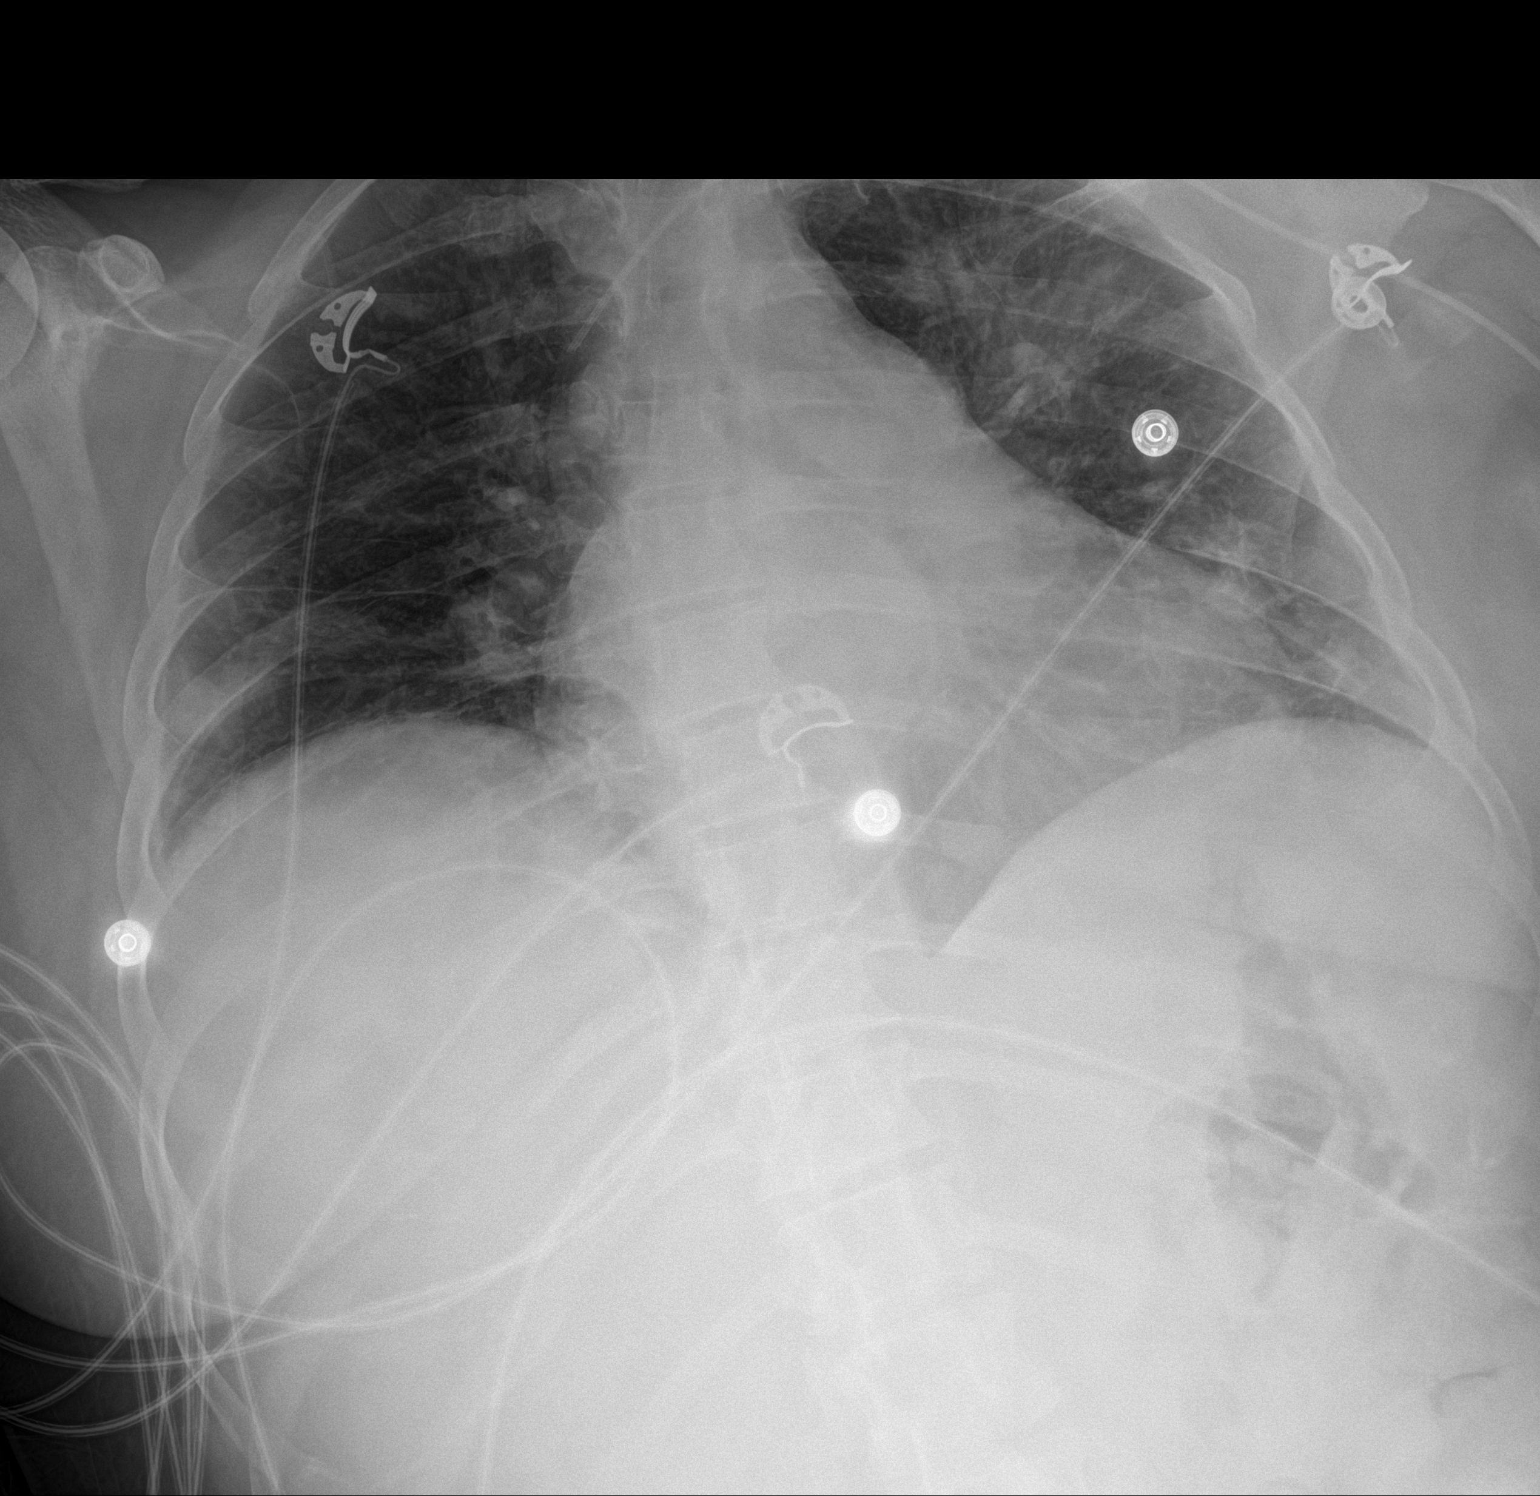

[1 of 1 positions shown; findings below may reference images not displayed]

FINDINGS: Cardiac silhouette is mildly enlarged. Mediastinal contours are
within normal limits. Moderately decreased lung volumes are similar
to prior.

Left upper extremity PICC tip overlies the junction of the left
brachiocephalic vein and the superior vena cava, new from [DATE]
prior radiographs.

There is interval decrease in size of multiple bilateral nodular
opacities. Note is made of numerous bilateral pulmonary nodules,
many of which were cavitary on [DATE] CT. These were new from
[DATE] CT.

No definite pleural effusion. No pneumothorax. Mild multilevel
degenerative disc changes of the thoracic spine with mild
dextrocurvature.
IMPRESSION: :
IMPRESSION: 1. Interval decrease in size and number of persistent bilateral
nodular pulmonary opacities compared to [DATE] radiograph.
Previously this was favored to represent bilateral septic emboli on
[DATE] CT.
2. No definite new airspace opacity.

## 2021-09-12 IMAGING — CT CT ANGIO CHEST
2 of 6 series · 15 of 46 positions shown · IV contrast (agent unspecified)
Comparison: [DATE].
COMPARISON: [DATE].

Addendum:
CLINICAL DATA: Fever, MRSA bacteremia, endocarditis.

EXAM:
CT ANGIOGRAPHY CHEST WITH CONTRAST
TECHNIQUE: Multidetector CT imaging of the chest was performed using the
standard protocol during bolus administration of intravenous
contrast. Multiplanar CT image reconstructions and MIPs were
obtained to evaluate the vascular anatomy.

[Series 6: thins · axial · 0.81mm/px · z∈[+1158,+1378]mm · 12 of 242 slices shown]
[im 11/242  lung]
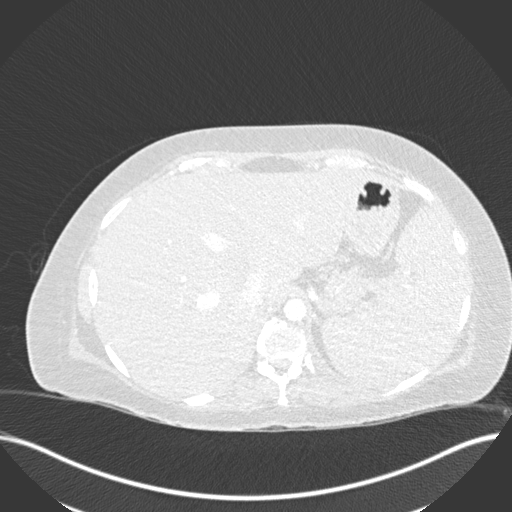
[im 32/242  soft-tissue]
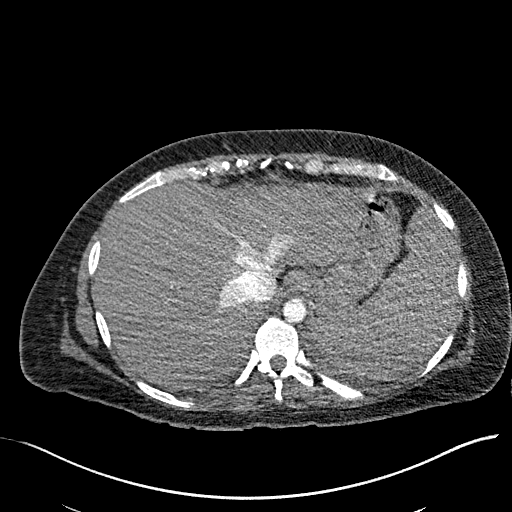
[im 53/242  lung]
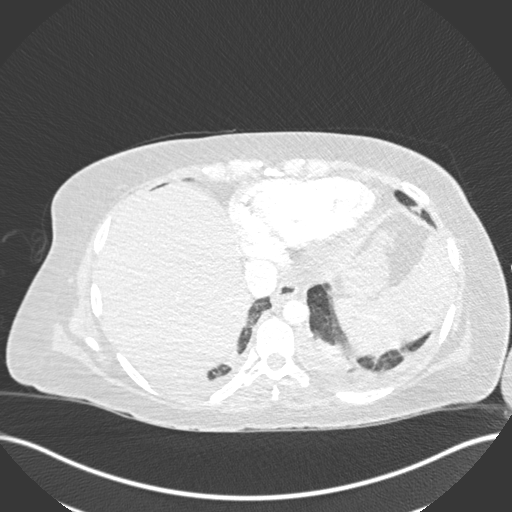
[im 74/242  soft-tissue]
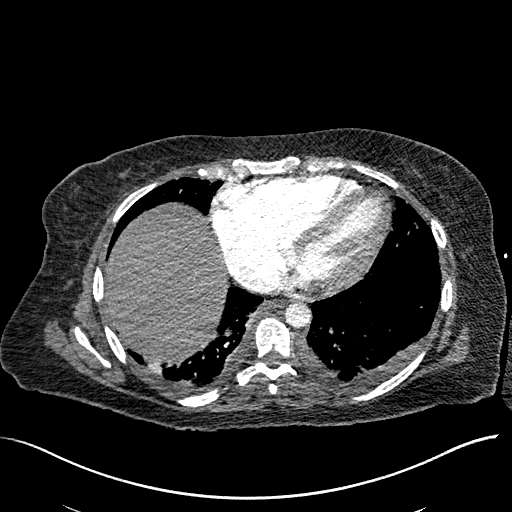
[im 95/242  lung]
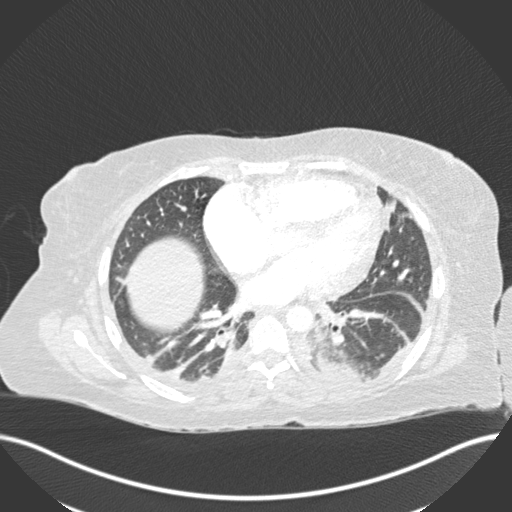
[im 116/242  soft-tissue]
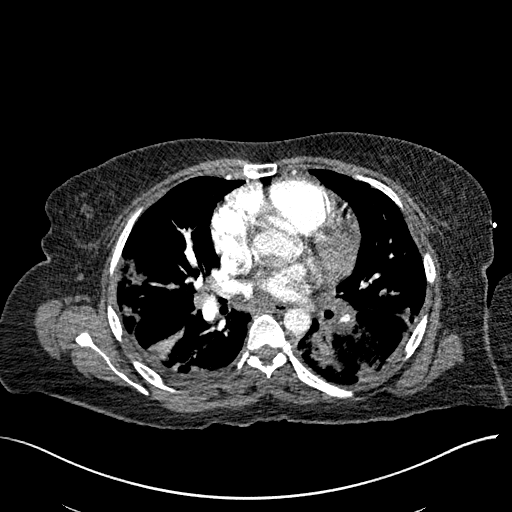
[im 126/242  lung]
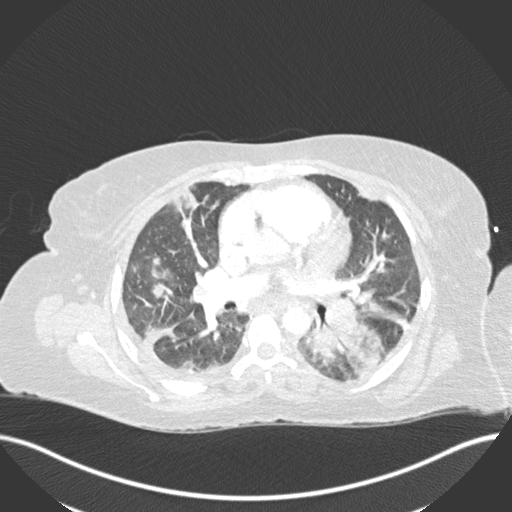
[im 147/242  soft-tissue]
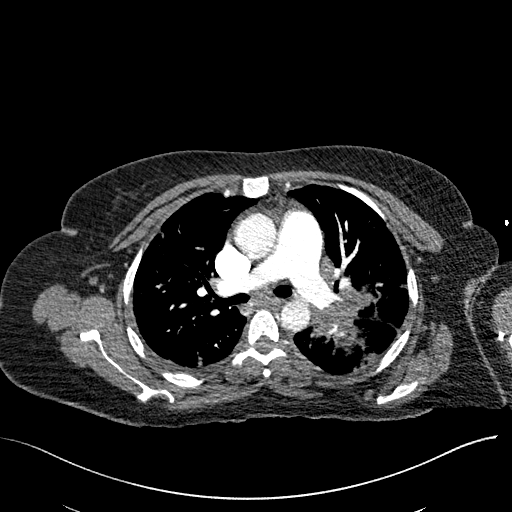
[im 168/242  lung]
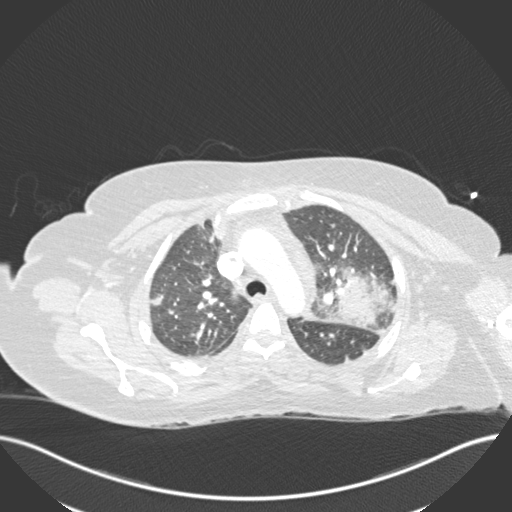
[im 189/242  soft-tissue]
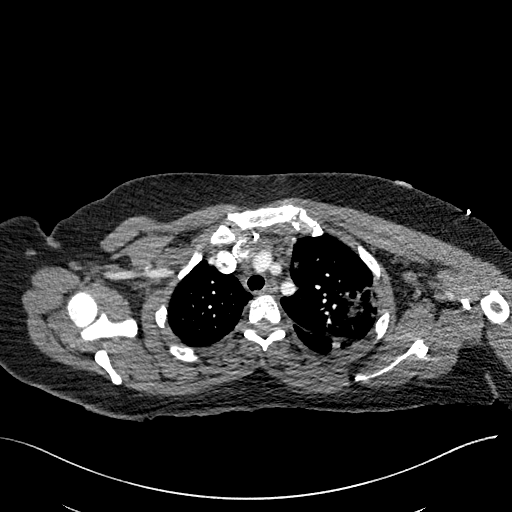
[im 210/242  lung]
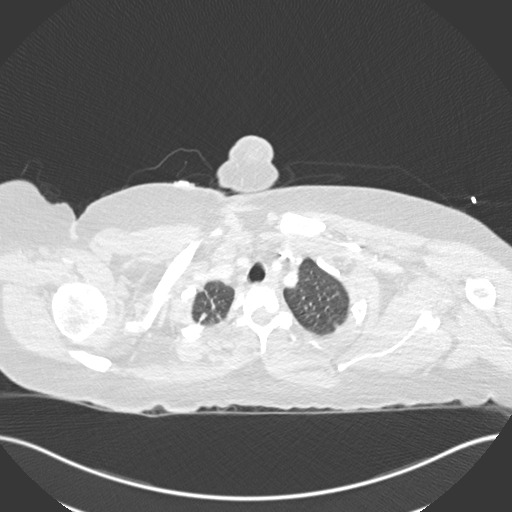
[im 231/242  soft-tissue]
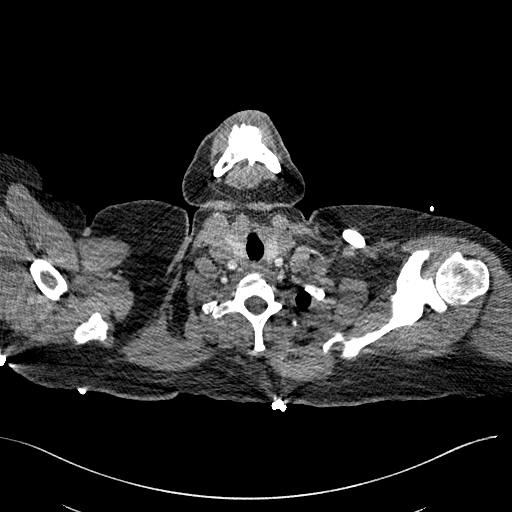

[Series 8: coronal mpr · coronal · 0.48mm/px · 3 of 128 slices shown]
[im 32/128  soft-tissue]
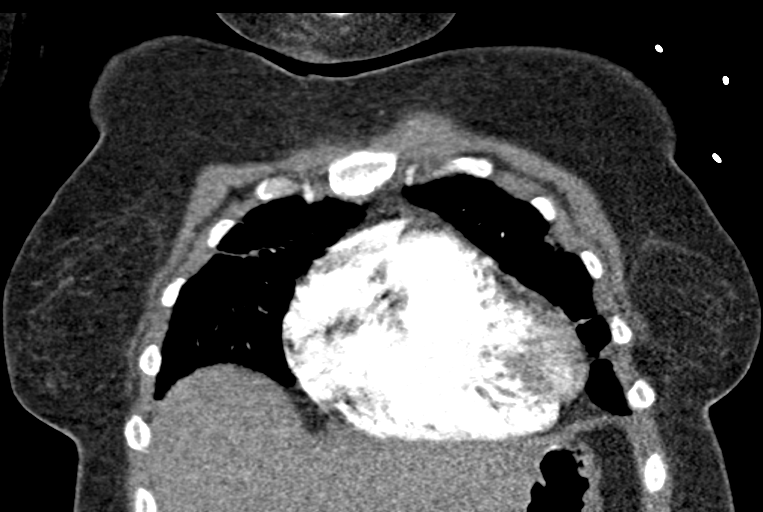
[im 64/128  soft-tissue]
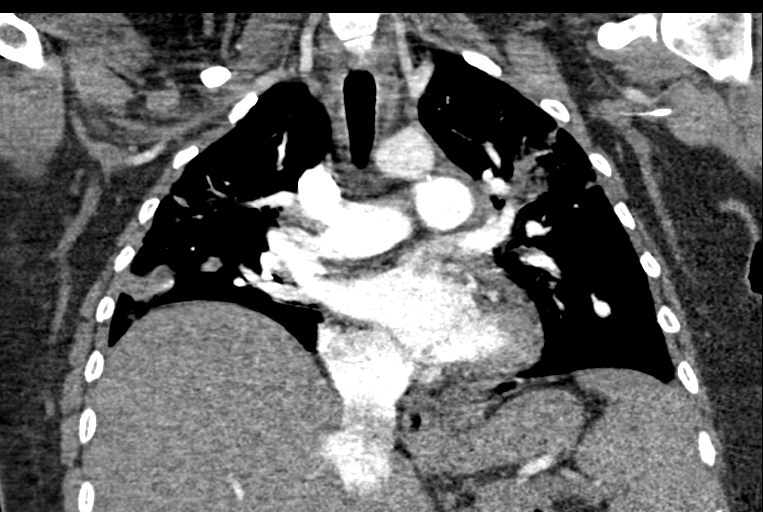
[im 96/128  soft-tissue]
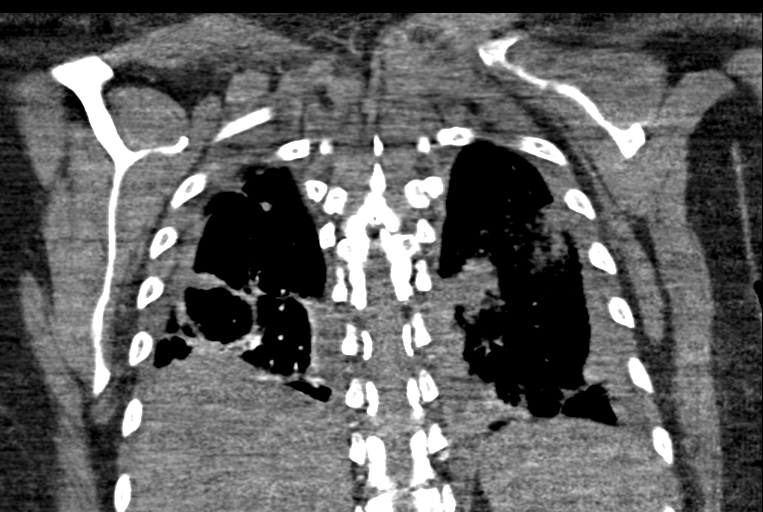

[15 of 46 positions shown; findings below may reference images not displayed]

RADIATION DOSE REDUCTION: This exam was performed according to the
departmental dose-optimization program which includes automated
exposure control, adjustment of the mA and/or kV according to
patient size and/or use of iterative reconstruction technique.

CONTRAST:  65mL OMNIPAQUE IOHEXOL 350 MG/ML SOLN
FINDINGS: Cardiovascular: The heart is stable in size and there is a trace
pericardial effusion. The aorta and pulmonary trunk are normal in
caliber. Filling defects are present in the left pulmonary artery,
as well as left upper and lower lobe lobar, segmental, and
subsegmental arteries. Scattered pulmonary artery filling defects
are noted in the segmental and subsegmental arteries in the right
upper lobe. The right ventricle is distended which may be associated
with right heart strain.

Mediastinum/Nodes: Mediastinal and hilar lymphadenopathy is noted
bilaterally. No axillary lymphadenopathy. The thyroid gland,
trachea, and esophagus are within normal limits.

Lungs/Pleura: Patchy infiltrates are present in the lungs
bilaterally and there is consolidation in the left upper and lower
lobes at the hilum. Scattered cavitary lesions are noted bilaterally
and improved from the prior exam. There are small bilateral pleural
effusions. No pneumothorax.

Upper Abdomen: The spleen is enlarged at 15.2 cm in length. There is
reflux of contrast into the inferior vena cava and hepatic veins
which may be associated with underlying right heart failure.

Musculoskeletal: Mild degenerative changes in the thoracic spine. No
acute osseous abnormality.

Review of the MIP images confirms the above findings.
IMPRESSION: 1. Bilateral pulmonary emboli, most pronounced on the left involving
the left pulmonary artery, lobar, segmental, and subsegmental
arteries. The right ventricle is distended suggesting right heart
strain.
2. Patchy infiltrates in the lungs bilaterally with new perihilar
consolidation on the left. A few residual cavitary lesions are noted
in the lungs bilaterally, compatible with septic emboli.
3. Small bilateral pleural effusions.
4. Splenomegaly.
5. Mild cardiomegaly with trace pericardial effusion.

ADDENDUM:
Critical findings were reported to Dr. FALL at [DATE] p.m.

*** End of Addendum ***
RADIATION DOSE REDUCTION: This exam was performed according to the
departmental dose-optimization program which includes automated
exposure control, adjustment of the mA and/or kV according to
patient size and/or use of iterative reconstruction technique.

CONTRAST:  65mL OMNIPAQUE IOHEXOL 350 MG/ML SOLN
FINDINGS: Cardiovascular: The heart is stable in size and there is a trace
pericardial effusion. The aorta and pulmonary trunk are normal in
caliber. Filling defects are present in the left pulmonary artery,
as well as left upper and lower lobe lobar, segmental, and
subsegmental arteries. Scattered pulmonary artery filling defects
are noted in the segmental and subsegmental arteries in the right
upper lobe. The right ventricle is distended which may be associated
with right heart strain.

Mediastinum/Nodes: Mediastinal and hilar lymphadenopathy is noted
bilaterally. No axillary lymphadenopathy. The thyroid gland,
trachea, and esophagus are within normal limits.

Lungs/Pleura: Patchy infiltrates are present in the lungs
bilaterally and there is consolidation in the left upper and lower
lobes at the hilum. Scattered cavitary lesions are noted bilaterally
and improved from the prior exam. There are small bilateral pleural
effusions. No pneumothorax.

Upper Abdomen: The spleen is enlarged at 15.2 cm in length. There is
reflux of contrast into the inferior vena cava and hepatic veins
which may be associated with underlying right heart failure.

Musculoskeletal: Mild degenerative changes in the thoracic spine. No
acute osseous abnormality.

Review of the MIP images confirms the above findings.
IMPRESSION: 1. Bilateral pulmonary emboli, most pronounced on the left involving
the left pulmonary artery, lobar, segmental, and subsegmental
arteries. The right ventricle is distended suggesting right heart
strain.
2. Patchy infiltrates in the lungs bilaterally with new perihilar
consolidation on the left. A few residual cavitary lesions are noted
in the lungs bilaterally, compatible with septic emboli.
3. Small bilateral pleural effusions.
4. Splenomegaly.
5. Mild cardiomegaly with trace pericardial effusion.

## 2021-09-12 MED ORDER — IOHEXOL 350 MG/ML SOLN
65.0000 mL | Freq: Once | INTRAVENOUS | Status: AC | PRN
  Administered 2021-09-12: 65 mL via INTRAVENOUS

## 2021-09-12 NOTE — Treatment Plan (Signed)
Overnight Event ? ?Messaged with rads results ? ? ?36 yo with hx MSSA RV endocarditis, MRSA discitis/osteo who's currently being treated for MRSA bacteremia with tricuspid valve vegetation and septic emboli.  CT chest obtained today due to fever, leukocytosis, and concern for "lung pain" for patient overnight.  CT notable for concern for bilateral PE, RV distension concerning for right heart strain.   Also with patchy infiltrates in lungs bilaterally with new perihilar consolidation on L.  Few residual cavitary lesions in lungs bilaterally, compatible with septic emboli.  ? ?Discussed with Dr. Thornell Sartorius with rads, in setting of her endocarditis, can't clearly differentiate septic emboli from VTE.  I think given her hx endocarditis and septic emboli, most c/w septic emboli.  She's been on DVT ppx with xarelto since 4/9.  Given concern for RH strain on CT (not noted on echo from 4/8), possibility of VTE is concerning. ? ?Will rule out septic emboli to brain with head CT.  If negative, will plan to start anticoagulation and follow up with limited echo for right heart strain, venous US tomorrow to help follow up these imaging findings to make more definitive decision regarding anticoagulation long term.  Will discuss with night coverage. ? ? ? ?

## 2021-09-12 NOTE — Progress Notes (Signed)
Occupational Therapy Treatment ?Patient Details ?Name: Latoya [name] ?MRN: [id number] ?DOB: 16-May-1985 ?Today's Date: 09/12/2021 ? ? ?History of present illness 36 y.o. female admitted 08/02/21 with back pain, hypotension, hyponatremia, leukocytosis and neutrophilia. Found to have bacteremia possible sepitc pulmonary emboli from endocarditis. PMH: IVDU, heroin abuse, ongoing. ?  ?OT comments ? Patient received in supine and agreeable to OT session. Patient able to get to EOB without leg lifter and performed AE training with reacher and sock aide for LB dressing. Patient able to demonstrate good recall from last week with occasional verbal cue. Patient performed transfer training to address toilet transfer and declined going to recliner and returned to supine. Patient making good gains with LB dressing. Acute OT to continue to follow.   ? ?Recommendations for follow up therapy are one component of a multi-disciplinary discharge planning process, led by the attending physician.  Recommendations may be updated based on patient status, additional functional criteria and insurance authorization. ?   ?Follow Up Recommendations ? Outpatient OT  ?  ?Assistance Recommended at Discharge Frequent or constant Supervision/Assistance  ?Patient can return home with the following ? A little help with walking and/or transfers;A little help with bathing/dressing/bathroom;Help with stairs or ramp for entrance;Assistance with cooking/housework;Assist for transportation ?  ?Equipment Recommendations ? BSC/3in1  ?  ?Recommendations for Other Services   ? ?  ?Precautions / Restrictions Precautions ?Precautions: Fall ?Precaution Booklet Issued: No ?Precaution Comments: Contact precs, lice, back precs for comfort due to pain ?Required Braces or Orthoses: Spinal Brace ?Spinal Brace: Lumbar corset ?Restrictions ?Weight Bearing Restrictions: No  ? ? ?  ? ?Mobility Bed Mobility ?Overal bed mobility: Needs Assistance ?Bed Mobility: Supine to Sit,  Sit to Supine ?  ?  ?Supine to sit: Supervision, HOB elevated ?Sit to supine: Supervision, HOB elevated ?  ?General bed mobility comments: able to get LEs off EOB without leg lifter ?  ? ?Transfers ?Overall transfer level: Needs assistance ?Equipment used: Rolling walker (2 wheels) ?Transfers: Sit to/from Stand ?Sit to Stand: Supervision ?  ?  ?  ?  ?  ?General transfer comment: address transfer to 3n1 with RW and supervision ?  ?  ?Balance Overall balance assessment: Needs assistance ?Sitting-balance support: No upper extremity supported, Feet supported ?Sitting balance-Leahy Scale: Good ?Sitting balance - Comments: able to perform LB dressing seated on EOB ?  ?Standing balance support: Single extremity supported ?Standing balance-Leahy Scale: Poor ?Standing balance comment: stood for transfer training ?  ?  ?  ?  ?  ?  ?  ?  ?  ?  ?  ?   ? ?ADL either performed or assessed with clinical judgement  ? ?ADL Overall ADL's : Needs assistance/impaired ?  ?  ?  ?  ?  ?  ?  ?  ?  ?  ?Lower Body Dressing: Sitting/lateral leans;With adaptive equipment;Supervision/safety ?Lower Body Dressing Details (indicate cue type and reason): occassiona verbal cue to use adaptive equipment correctly for LB dressing ?Toilet Transfer: Supervision/safety;BSC/3in1;Rolling walker (2 wheels) ?Toilet Transfer Details (indicate cue type and reason): Supervision A for safety ?  ?  ?  ?  ?  ?General ADL Comments: focused on AE training for LB dressing and tiolet transfers ?  ? ?Extremity/Trunk Assessment   ?  ?  ?  ?  ?  ? ?Vision   ?  ?  ?Perception   ?  ?Praxis   ?  ? ?Cognition Arousal/Alertness: Awake/alert ?Behavior During Therapy: [facility] for tasks assessed/performed ?Overall Cognitive Status: Within  Functional Limits for tasks assessed ?  ?  ?  ?  ?  ?  ?  ?  ?  ?  ?  ?  ?  ?  ?  ?Problem Solving: Slow processing, Requires verbal cues ?General Comments: able to recall use of adaptive equipment ?  ?  ?   ?Exercises   ? ?  ?Shoulder Instructions    ? ? ?  ?General Comments    ? ? ?Pertinent Vitals/ Pain       Pain Assessment ?Pain Assessment: 0-10 ?Pain Score: 5  ?Faces Pain Scale: Hurts a little bit ?Pain Location: back, LLE ?Pain Descriptors / Indicators: Sore, Grimacing ?Pain Intervention(s): Limited activity within patient's tolerance, Monitored during session ? ?Home Living   ?  ?  ?  ?  ?  ?  ?  ?  ?  ?  ?  ?  ?  ?  ?  ?  ?  ?  ? ?  ?Prior Functioning/Environment    ?  ?  ?  ?   ? ?Frequency ? Min 2X/week  ? ? ? ? ?  ?Progress Toward Goals ? ?OT Goals(current goals can now be found in the care plan section) ? Progress towards OT goals: Progressing toward goals ? ?Acute Rehab OT Goals ?Patient Stated Goal: get better ?OT Goal Formulation: With patient ?Time For Goal Achievement: 09/18/21 ?Potential to Achieve Goals: Good ?ADL Goals ?Pt Will Perform Grooming: with modified independence;standing ?Pt Will Perform Lower Body Dressing: with modified independence;with adaptive equipment;sit to/from stand ?Pt Will Transfer to Toilet: with modified independence;ambulating;bedside commode ?Pt Will Perform Toileting - Clothing Manipulation and hygiene: with modified independence;sit to/from stand ?Additional ADL Goal #1: Pt will Mod I in and OOB for basic ADLs (HOB flat, no rail, leg lifter prn, increased time)  ?Plan Discharge plan remains appropriate   ? ?Co-evaluation ? ? ?   ?  ?  ?  ?  ? ?  ?AM-PAC OT "6 Clicks" Daily Activity     ?Outcome Measure ? ? Help from another person eating meals?: None ?Help from another person taking care of personal grooming?: A Little ?Help from another person toileting, which includes using toliet, bedpan, or urinal?: A Little ?Help from another person bathing (including washing, rinsing, drying)?: A Little ?Help from another person to put on and taking off regular upper body clothing?: A Little ?Help from another person to put on and taking off regular lower body clothing?: A Little ?6 Click Score: 19 ? ?  ?End of Session  Equipment Utilized During Treatment: Rolling walker (2 wheels) ? ?OT Visit Diagnosis: Muscle weakness (generalized) (M62.81);Pain ?  ?Activity Tolerance Patient tolerated treatment well ?  ?Patient Left in bed;with call bell/phone within reach ?  ?Nurse Communication Mobility status ?  ? ?   ? ?Time: 9622-2979 ?OT Time Calculation (min): 24 min ? ?Charges: OT General Charges ?$OT Visit: 1 Visit ?OT Treatments ?$Self Care/Home Management : 23-37 mins ? ?Alfonse Flavors, OTA ?Acute Rehabilitation Services  ?Pager [phone] ?Office [phone] ? ? ?Dewain Penning ?09/12/2021, 1:01 PM ?

## 2021-09-12 NOTE — TOC Progression Note (Signed)
Transition of Care (TOC) - Progression Note  ?Donn Pierini Charity fundraiser, BSN ?Transitions of Care ?Unit 4E- RN Case Manager ?See Treatment Team for direct phone #  ? ? ? ?Patient Details  ?Name: Latoya [name] ?MRN: [id number] ?Date of Birth: Dec 03, 1985 ? ?Transition of Care (TOC) CM/SW Contact  ?Darrold Span, RN ?Phone Number: ?09/12/2021, 4:33 PM ? ?Clinical Narrative:    ?Pt continues here on IV abx until May 13. CM spoke with pt and her mom Clydie Braun via TC- and per mom Clydie Braun pt is unable to return home with her at this time and both patient and mom are wanting patient to go somewhere for drug rehab.  ?CM explained that this process is done by pt/family- and responsibility lays on them to find a rehab that will accept that she can go to on discharge. Encouraged pt to ask about use of assist devices at the rehab as long as she is able to be independent with her ADLs.  ?Pt voiced she and her mom are looking into a rehab in [place]. Explained it would be patient responsibility to transport to rehab.  ? ?List of local rehabs both INPT and intensive outpt provided to pt as well as local pain clinics for Suboxone should pt decide she would like to be on Suboxone- if pt goes to a drug rehab most do not want patients to be on controlled substances on admit including pain clinics.  ? ?Pt is still using RW here- discussed with her getting RW for discharge, which pt voiced she feels she will need- will ask MD to place DME order for RW and request in house provider to deliver to room prior to discharge.  ? ?CM will also follow for any additional assistance pt may need, TOC can be a resource to assist pt with sending any clinicals needed to a rehab on pt request should she find one and they request any.  ? ?If pt needs any medications on discharge, may be helpful to use [facility] pharmacy to fill on Friday and store in Main pharmacy to be prepared for a weekend discharge and make sure she has her medications in hand when she leaves.  Discussed with pt using TOC pharmacy which she was agreeable to.  ? ? ?Expected Discharge Plan: Home/Self Care (Pt looking at INPT drug rehab) ?Barriers to Discharge: Continued Medical Work up (Requiring IV antibiotics through 09/16/2021, Hx IVDU) ? ?Expected Discharge Plan and Services ?Expected Discharge Plan: Home/Self Care (Pt looking at INPT drug rehab) ?In-house Referral: Clinical Social Work, PCP / Management consultant ?Discharge Planning Services: CM Consult, Follow-up appt scheduled ?Post Acute Care Choice: Durable Medical Equipment ?Living arrangements for the past 2 months: No permanent address ?                ?DME Arranged: Walker rolling ?DME Agency: [employer] ?  ?  ?  ?HH Arranged: NA ?HH Agency: NA ?  ?  ?  ? ? ?Social Determinants of Health (SDOH) Interventions ?  ? ?Readmission Risk Interventions ? ?  08/17/2021  ? 11:31 AM  ?Readmission Risk Prevention Plan  ?Transportation Screening Complete  ?PCP or Specialist Appt within 5-7 Days Complete  ?Home Care Screening Complete  ?Medication Review (RN CM) Complete  ? ? ?

## 2021-09-12 NOTE — Progress Notes (Signed)
?   09/12/21 0400  ?Vitals  ?Temp 99.1 ?F (37.3 ?C)  ?Temp Source Oral  ?BP 103/81  ?MAP (mmHg) 90  ?BP Location Right Arm  ?BP Method Automatic  ?Patient Position (if appropriate) Lying  ?Pulse Rate Source Monitor  ?ECG Heart Rate (!) 105  ?Resp 20  ?MEWS COLOR  ?MEWS Score Color Green  ?Oxygen Therapy  ?SpO2 98 %  ?O2 Device Room Air  ?MEWS Score  ?MEWS Temp 0  ?MEWS Systolic 0  ?MEWS Pulse 1  ?MEWS RR 0  ?MEWS LOC 0  ?MEWS Score 1  ? ?Pt called out c/o severe sudden onset of pleuritic chest pain ("it hurts really bad in my lungs") ?She stated it happened all of a sudden w/ no preceding factors. States it is similar to pain she had during her initial admission, but it hasn't been this bad in a while since she's been hospitalized. ?States it is worse with inspiration, however she is able to take normal breaths and denies any shortness of breath. ? ?Vitals above, no acute change noted from prior readings. Lung sounds clear. No cough. EKG obtained and placed in chart- sinus tach w/ no other findings. ? ?Gave PRN oxy+tylenol. Pt visibly anxious, assured her we are monitoring her HR and O2 closely so we will be aware of any changes.  ?

## 2021-09-12 NOTE — Progress Notes (Signed)
?PROGRESS NOTE ? ? ? ?Latoya [name]  IAX:[id number] DOB: 1986/02/09 DOA: 08/02/2021 ?PCP: Pcp, No  ?Chief Complaint  ?Patient presents with  ? Drug Problem  ? Back Pain  ? Leg Pain  ? Dysuria  ? ? ?Brief Narrative:  ?Patient is Latoya [name] 36 years old female with past medical history of MSSA tricuspid valve endocarditis in 2020 as well as MRSA discitis and osteomyelitis in 2021 with ongoing IV drug abuse presented to hospital with low back pain, cough and fever.  MRI of the lumbar spine was negative for infection but blood culture was positive for MRSA bacteremia with tricuspid valve vegetation and septic pulmonary emboli.  Patient was seen by CT surgery and underwent angio VAC  procedure, but the discovery of Latoya [name] significant PFO required the abortion of this procedure.  She has been seen by IDA and plan is to continue antibiotic until 09/16/2021 and to repeat TTE about 5 to 7 days prior to discontinuation of antibiotics. ? ?She had fever 5/8 PM.  Repeat cultures pending.  CT PE pending.  Will need to rediscuss with ID in the AM.  ? ? ?Assessment & Plan: ?  ?Principal Problem: ?  MRSA bacteremia ?Active Problems: ?  Endocarditis of tricuspid valve ?  History of bacterial endocarditis ?  Septic pulmonary embolism ([place]) ?  Opioid use disorder ?  IV drug abuse ([place]) ?  Chronic lumbar pain ?  Iron deficiency anemia ?  Ambulatory dysfunction ?  Chronic pain ?  History of lice ? ? ?Assessment and Plan: ?* MRSA bacteremia ?Treating bacteremia as above ? ?Septic pulmonary embolism ([place]) ?With small bilateral effusions ?Follow CXR intermittent (pending CT scan as above) ? ?History of bacterial endocarditis ?TV in 2020, MSSA, got angiovac at [facility]. Treated with Ancef at that time. 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 ?-- as above ? ?Endocarditis of tricuspid valve ?Patient presenting to the ED with back pain and fevers in the setting of continued IV drug abuse.  Met criteria  for severe sepsis with endorgan failure and hypotension on admission complicated by pulmonary septic emboli and MRSA bacteremia with tricuspid valve endocarditis on echocardiogram. ?--Infectious disease following, appreciate assistance ?--CT surgery c/s, underwent angio vac, but procedure was aborted due to positive bubble study concerning for PFO with further investigation revealing connection between R and L atrium with flows from R to L ?--last ID note I see from 4/12? (recommending 6 weeks from negative culture, EOT 5/13, not home abx candidate, follow up appt 5/15) ?--initial blood cultures from 3/29 with MRSA.  repeat blood cultures 4/2; 1/4 positive for Staph epidermidis, likely contaminant; otherwise remaining bottles no growth x5 days ?--Continue vancomycin, pharmacy consulted for dosing/monitoring ?--repeat echo shows mobile lateral tricuspid valve vegetation, she c/o chest discomfort overnight and chills as well as worsening white count.  CXR with interval decrease in size and number of bilateral nodular pulm opacities.   ?Will get CT PE protocol  ?Repeat blood cultures ?Will need to discuss again with ID in AM ? ?IV drug abuse ([place]) ?Counseled on need for complete cessation given her recurrent tricuspid valve endocarditis now with MRSA ? ?Opioid use disorder ?Avoid IV abx ?Could consider suboxone, though need follow up and would need her to be agreeable with induction here in hospital.  No decision on this yet.  Currently on oxycodone prn -> decrease this to 7.5 q6 prn. ? ? ?Chronic lumbar pain ?MR T and L spine  unremarkable ? ?Chronic pain ?Started on baclofen during this admission, will taper this to off, may need to be discharged with [name] few pills ?Will continue cymbalta ?Schedule tylenol ?Trial lidocaine patch for low back pain ?Trial voltaren gel for lower extremity pain ?Was started on lyrica, it looks like during this hospitalization as well.  i'm not sure about this long term, will consider  tapering (but since already tapering baclofen, will continue to discuss). ?Oxycodone prn ? ? ? ?Ambulatory dysfunction ?Continue PT/OT ? ?Iron deficiency anemia ?Iron panel with serum iron of 22, transferrin saturation 7, ferritin 51 and TIBC 312. ?--Ferrous gluconate 324 mg p.o. daily ? ?History of lice ?Not POA, likely brought in from outside visitors ?Patient completed permethrin on 08/31/2021 ?Needs repeat treatment, she's resistant with matted hair, continue to discus, permethrin ordered but refused on 5/6 ?follow ?Unfortunately patient's belongings were placed into plastic bags but were not treated at that time, have instructed patient that she is not open these bags while in the hospital as they are considered contaminated ? ?Hyponatremia-resolved as of 08/27/2021 ?resolved ? ? ? ?DVT prophylaxis: xarelto ?Code Status: full ?Family Communication: none ?Disposition:  ? ?Status is: Inpatient ?Remains inpatient appropriate because: need for iv abx ?  ?Consultants:  ?ID ?Ct surgery ?cardiology ? ?Procedures:  ?Echo ?IMPRESSIONS  ? ? ? 1. Cannot R/O small oscillating density on TV; suggest TEE to further  ?assess.  ? 2. Left ventricular ejection fraction, by estimation, is 60 to 65%. The  ?left ventricle has normal function. The left ventricle has no regional  ?wall motion abnormalities. Left ventricular diastolic parameters were  ?normal.  ? 3. Right ventricular systolic function is normal. The right ventricular  ?size is normal. There is normal pulmonary artery systolic pressure.  ? 4. The mitral valve is normal in structure. No evidence of mitral valve  ?regurgitation. No evidence of mitral stenosis.  ? 5. The aortic valve is tricuspid. Aortic valve regurgitation is not  ?visualized. No aortic stenosis is present.  ? 6. The inferior vena cava is normal in size with greater than 50%  ?respiratory variability, suggesting right atrial pressure of 3 mmHg.  ? ?Comparison(s): No prior Echocardiogram.   ? ?TEE ?IMPRESSIONS  ? ? ? 1. Left ventricular ejection fraction, by estimation, is 65 to 70%. The  ?left ventricle has normal function.  ? 2. Right ventricular systolic function is mildly reduced. The right  ?ventricular size is mildly enlarged.  ? 3. Left atrial size was mildly dilated. No left atrial/left atrial  ?appendage thrombus was detected.  ? 4. Right atrial size was moderately dilated.  ? 5. The mitral valve is normal in structure. Trivial mitral valve  ?regurgitation.  ? 6. Large highly mobile 1.8 cm vegetation seems to arise from lateral cusp  ?of TV . The tricuspid valve is abnormal. Tricuspid valve regurgitation is  ?severe.  ? 7. The aortic valve is tricuspid. Aortic valve regurgitation is not  ?visualized. No aortic stenosis is present.  ? ?Antimicrobials:  ?Anti-infectives (From admission, onward)  ? ? Start     Dose/Rate Route Frequency Ordered Stop  ? 08/29/21 2200  vancomycin (VANCOREADY) IVPB 1750 mg/350 mL       ? 1,750 mg ?175 mL/hr over 120 Minutes Intravenous Every 24 hours 08/29/21 1121    ? 08/21/21 1800  vancomycin (VANCOREADY) IVPB 750 mg/150 mL  Status:  Discontinued       ? 750 mg ?150 mL/hr over 60 Minutes Intravenous Every 12 hours 08/21/21  1344 08/21/21 1347  ? 08/21/21 1800  vancomycin (VANCOCIN) IVPB 1000 mg/200 mL premix  Status:  Discontinued       ? 1,000 mg ?200 mL/hr over 60 Minutes Intravenous Every 12 hours 08/21/21 1347 08/29/21 1121  ? 08/15/21 0600  vancomycin (VANCOCIN) IVPB 1000 mg/200 mL premix  Status:  Discontinued       ? 1,000 mg ?200 mL/hr over 60 Minutes Intravenous Every 12 hours 08/14/21 2006 08/21/21 1344  ? 08/09/21 1000  vancomycin (VANCOREADY) IVPB 750 mg/150 mL  Status:  Discontinued       ? 750 mg ?150 mL/hr over 60 Minutes Intravenous Every 8 hours 08/09/21 0237 08/14/21 2006  ? 08/08/21 1700  vancomycin (VANCOREADY) IVPB 1500 mg/300 mL  Status:  Discontinued       ? 750 mg ?75 mL/hr over 120 Minutes Intravenous Every 8 hours 08/08/21 0920 08/09/21  0237  ? 08/05/21 1800  vancomycin (VANCOREADY) IVPB 1500 mg/300 mL  Status:  Discontinued       ? 1,500 mg ?150 mL/hr over 120 Minutes Intravenous Every 12 hours 08/05/21 0618 08/08/21 0920  ? 08/04/21 1700  vancomycin [facility]

## 2021-09-13 ENCOUNTER — Inpatient Hospital Stay (HOSPITAL_COMMUNITY): Payer: Medicaid Other

## 2021-09-13 DIAGNOSIS — I079 Rheumatic tricuspid valve disease, unspecified: Secondary | ICD-10-CM | POA: Diagnosis not present

## 2021-09-13 DIAGNOSIS — R7881 Bacteremia: Secondary | ICD-10-CM | POA: Diagnosis not present

## 2021-09-13 DIAGNOSIS — B9562 Methicillin resistant Staphylococcus aureus infection as the cause of diseases classified elsewhere: Secondary | ICD-10-CM | POA: Diagnosis not present

## 2021-09-13 DIAGNOSIS — D509 Iron deficiency anemia, unspecified: Secondary | ICD-10-CM | POA: Diagnosis not present

## 2021-09-13 DIAGNOSIS — I2699 Other pulmonary embolism without acute cor pulmonale: Secondary | ICD-10-CM | POA: Diagnosis not present

## 2021-09-13 DIAGNOSIS — I2609 Other pulmonary embolism with acute cor pulmonale: Secondary | ICD-10-CM

## 2021-09-13 DIAGNOSIS — I330 Acute and subacute infective endocarditis: Secondary | ICD-10-CM | POA: Diagnosis not present

## 2021-09-13 DIAGNOSIS — R008 Other abnormalities of heart beat: Secondary | ICD-10-CM | POA: Diagnosis not present

## 2021-09-13 DIAGNOSIS — I2690 Septic pulmonary embolism without acute cor pulmonale: Secondary | ICD-10-CM | POA: Diagnosis not present

## 2021-09-13 HISTORY — DX: Other pulmonary embolism without acute cor pulmonale: I26.99

## 2021-09-13 LAB — COMPREHENSIVE METABOLIC PANEL
ALT: 11 U/L (ref 0–44)
AST: 13 U/L — ABNORMAL LOW (ref 15–41)
Albumin: 2.7 g/dL — ABNORMAL LOW (ref 3.5–5.0)
Alkaline Phosphatase: 71 U/L (ref 38–126)
Anion gap: 6 (ref 5–15)
BUN: 10 mg/dL (ref 6–20)
CO2: 25 mmol/L (ref 22–32)
Calcium: 8.3 mg/dL — ABNORMAL LOW (ref 8.9–10.3)
Chloride: 102 mmol/L (ref 98–111)
Creatinine, Ser: 0.64 mg/dL (ref 0.44–1.00)
GFR, Estimated: >60 mL/min (ref >60)
Glucose, Bld: 152 mg/dL — ABNORMAL HIGH (ref 70–99)
Potassium: 3.6 mmol/L (ref 3.5–5.1)
Sodium: 133 mmol/L — ABNORMAL LOW (ref 135–145)
Total Bilirubin: 1.1 mg/dL (ref 0.3–1.2)
Total Protein: 7.2 g/dL (ref 6.5–8.1)

## 2021-09-13 LAB — CBC WITH DIFFERENTIAL/PLATELET
Abs Immature Granulocytes: 0.06 10*3/uL (ref 0.00–0.07)
Basophils Absolute: 0 10*3/uL (ref 0.0–0.1)
Basophils Relative: 0 %
Eosinophils Absolute: 0.3 10*3/uL (ref 0.0–0.5)
Eosinophils Relative: 2 %
HCT: 27.5 % — ABNORMAL LOW (ref 36.0–46.0)
Hemoglobin: 8.7 g/dL — ABNORMAL LOW (ref 12.0–15.0)
Immature Granulocytes: 0 %
Lymphocytes Relative: 15 %
Lymphs Abs: 2.3 10*3/uL (ref 0.7–4.0)
MCH: 24.4 pg — ABNORMAL LOW (ref 26.0–34.0)
MCHC: 31.6 g/dL (ref 30.0–36.0)
MCV: 77 fL — ABNORMAL LOW (ref 80.0–100.0)
Monocytes Absolute: 1.2 10*3/uL — ABNORMAL HIGH (ref 0.1–1.0)
Monocytes Relative: 8 %
Neutro Abs: 11 10*3/uL — ABNORMAL HIGH (ref 1.7–7.7)
Neutrophils Relative %: 75 %
Platelets: 191 10*3/uL (ref 150–400)
RBC: 3.57 MIL/uL — ABNORMAL LOW (ref 3.87–5.11)
RDW: 18.7 % — ABNORMAL HIGH (ref 11.5–15.5)
WBC: 14.9 10*3/uL — ABNORMAL HIGH (ref 4.0–10.5)
nRBC: 0 % (ref 0.0–0.2)

## 2021-09-13 LAB — ECHOCARDIOGRAM LIMITED
Height: 66 in
S' Lateral: 2.4 cm
Weight: 3086.44 oz

## 2021-09-13 LAB — PHOSPHORUS: Phosphorus: 3.5 mg/dL (ref 2.5–4.6)

## 2021-09-13 LAB — APTT
aPTT: 46 seconds — ABNORMAL HIGH (ref 24–36)
aPTT: 43 seconds — ABNORMAL HIGH (ref 24–36)

## 2021-09-13 LAB — C-REACTIVE PROTEIN: CRP: 24.4 mg/dL — ABNORMAL HIGH (ref <1.0)

## 2021-09-13 LAB — HEPARIN LEVEL (UNFRACTIONATED): Heparin Unfractionated: 0.38 IU/mL (ref 0.30–0.70)

## 2021-09-13 LAB — MAGNESIUM: Magnesium: 1.8 mg/dL (ref 1.7–2.4)

## 2021-09-13 LAB — SEDIMENTATION RATE: Sed Rate: 80 mm/hr — ABNORMAL HIGH (ref 0–22)

## 2021-09-13 LAB — PROCALCITONIN: Procalcitonin: 0.15 ng/mL

## 2021-09-13 IMAGING — CT CT HEAD W/O CM
3 of 4 series · 15 of 47 positions shown, 18 images · non-contrast
Comparison: None Available.

CLINICAL DATA: Endocarditis, MRSA bacteremia



[Series 4: head 2.0 h70h · axial · 0.42mm/px · z∈[-109,+19]mm · 9 of 80 slices shown, 12 images]
[im 8/80  brain]
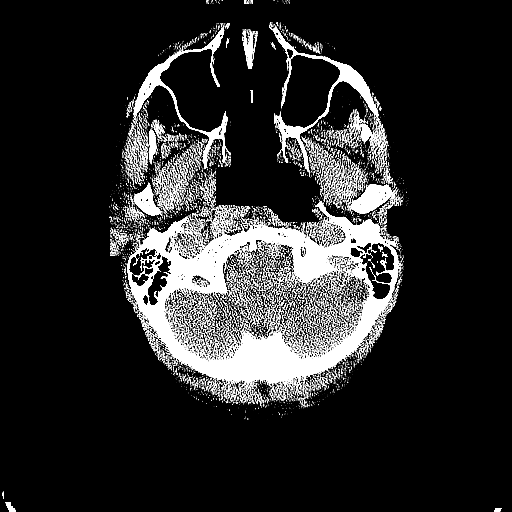
[im 8/80  bone]
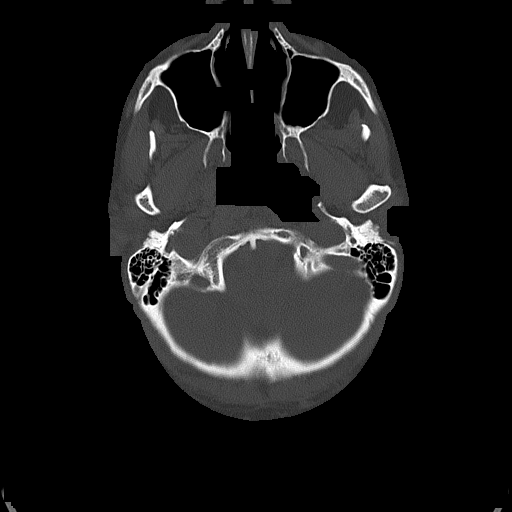
[im 16/80  brain]
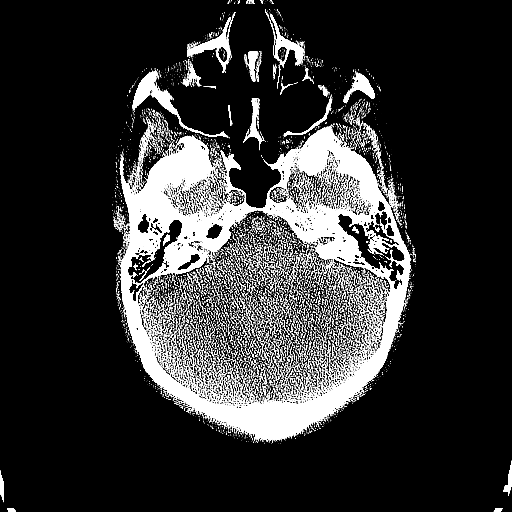
[im 24/80  brain]
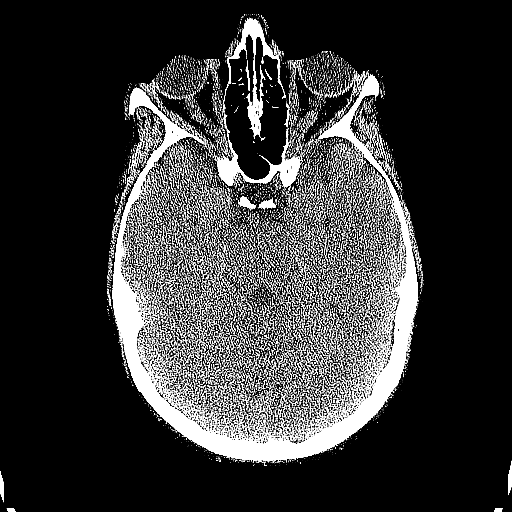
[im 32/80  brain]
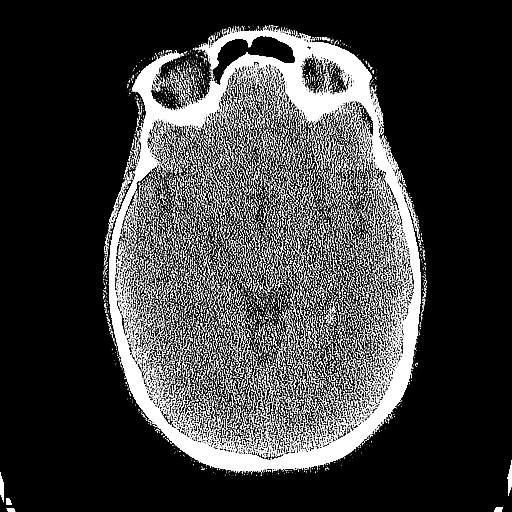
[im 40/80  brain]
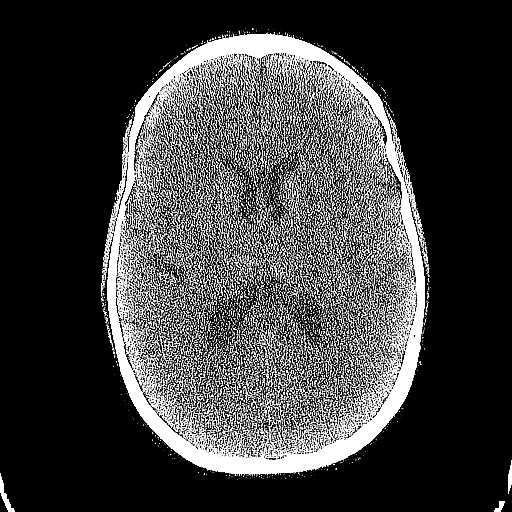
[im 40/80  bone]
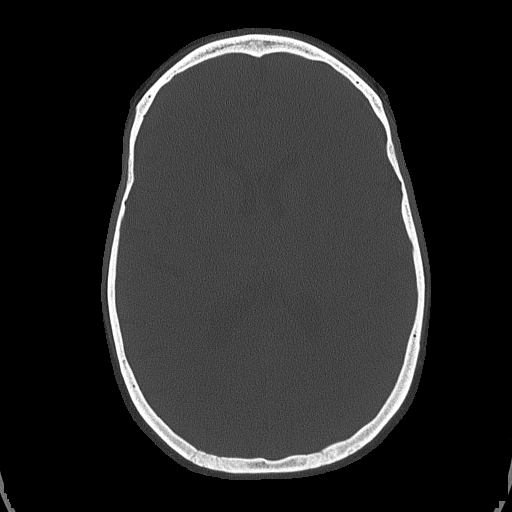
[im 48/80  brain]
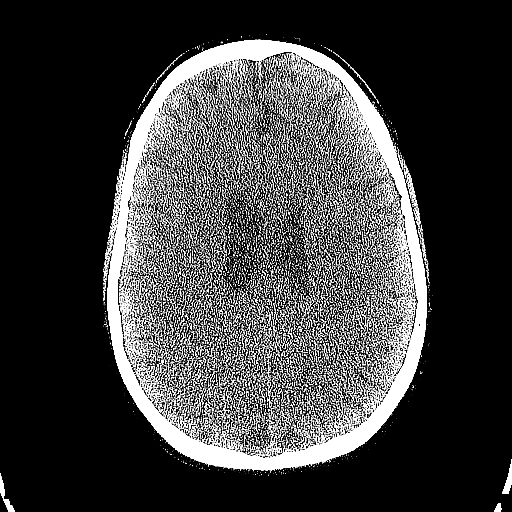
[im 56/80  brain]
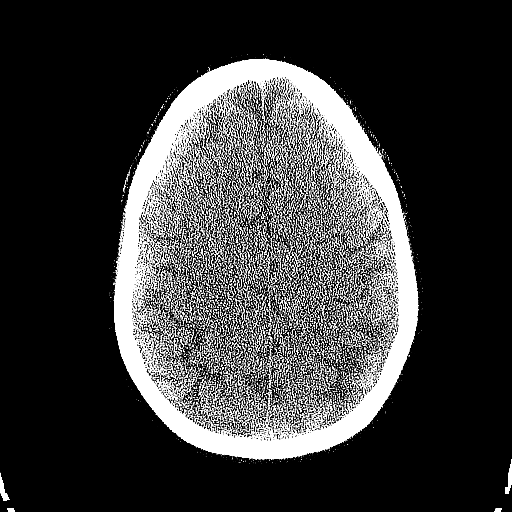
[im 64/80  brain]
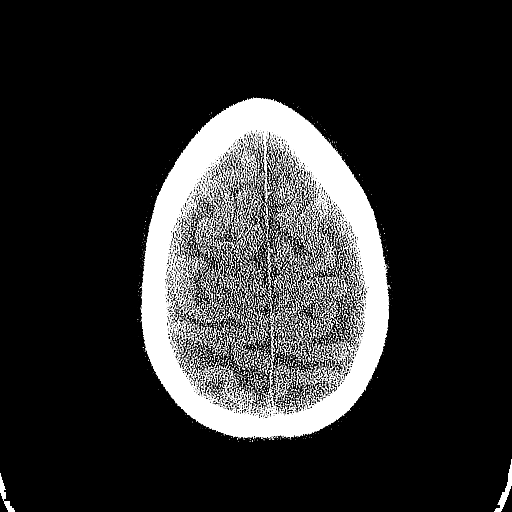
[im 72/80  brain]
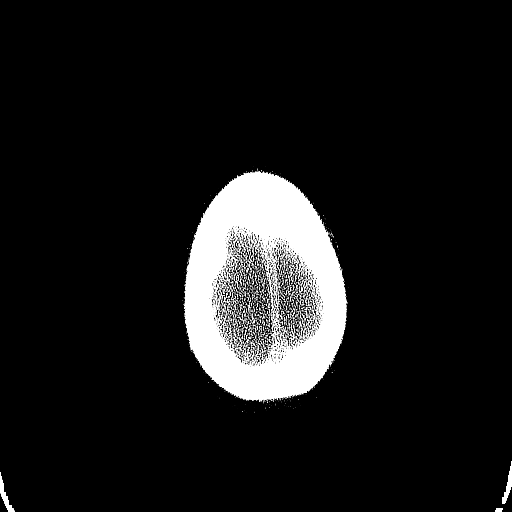
[im 72/80  bone]
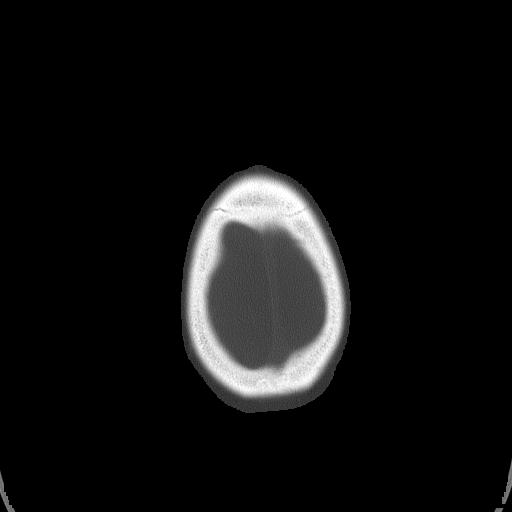

[Series 5: head 3.0 mpr cor · coronal · 0.30mm/px · 3 of 71 slices shown]
[im 24/71  brain]
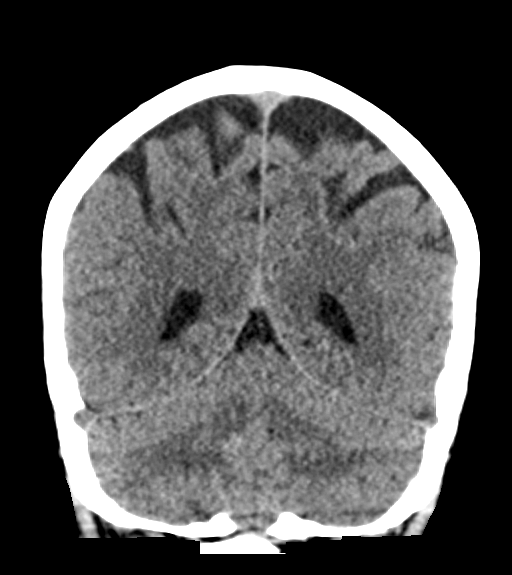
[im 32/71  brain]
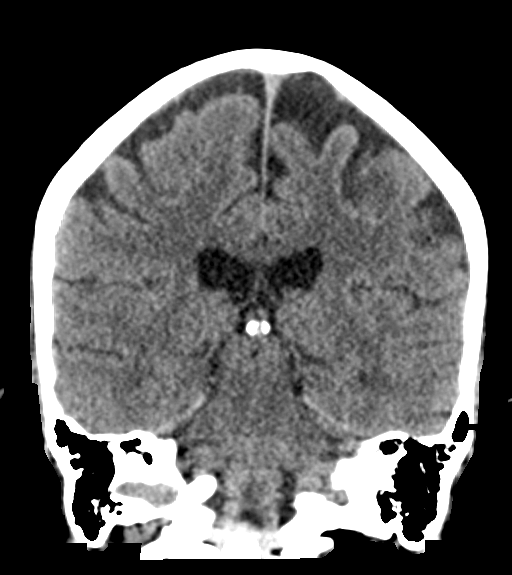
[im 39/71  brain]
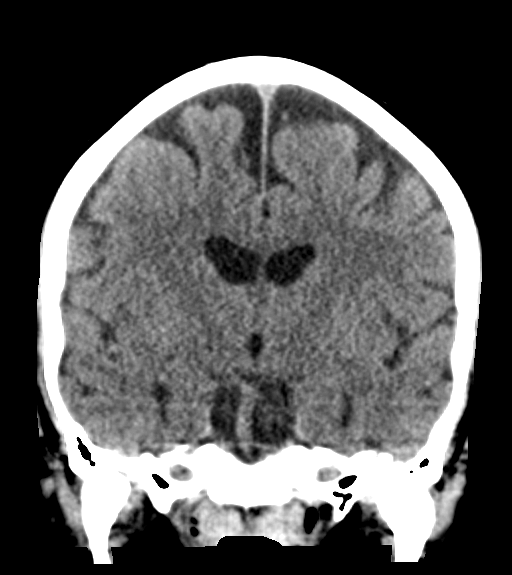

[Series 6: head 3.0 mpr sag · sagittal · 0.34mm/px · 3 of 53 slices shown]
[im 18/53  brain]
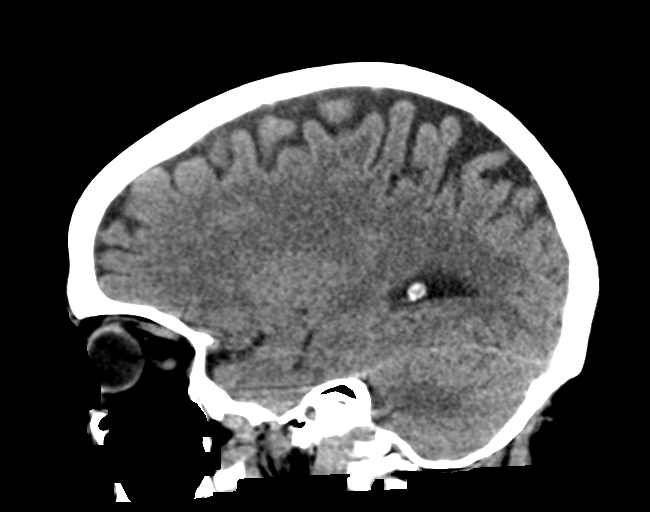
[im 27/53  brain]
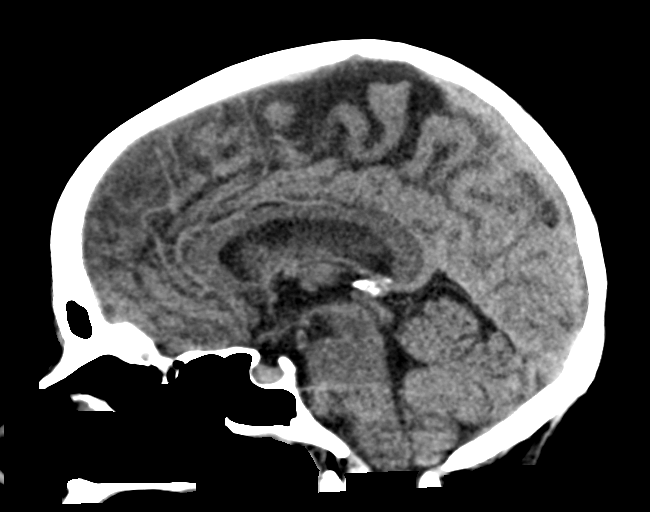
[im 35/53  brain]
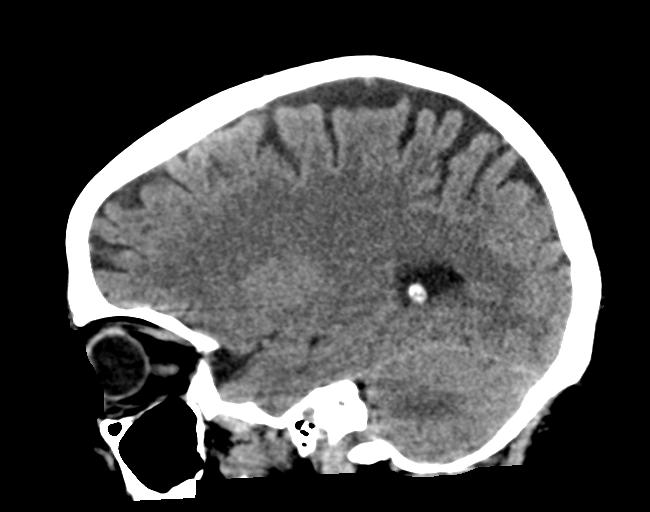

[15 of 47 positions shown; findings below may reference images not displayed]

FINDINGS: Brain: Normal anatomic configuration. No abnormal intra or
extra-axial mass lesion or fluid collection. No abnormal mass effect
or midline shift. No evidence of acute intracranial hemorrhage or
infarct. Ventricular size is normal. Cerebellum unremarkable.

Vascular: Unremarkable

Skull: Intact

Sinuses/Orbits: Paranasal sinuses are clear. Orbits are
unremarkable.

Other: Mastoid air cells and middle ear cavities are clear.
IMPRESSION: No acute intracranial abnormality.

## 2021-09-13 MED ORDER — MORPHINE SULFATE (PF) 2 MG/ML IV SOLN
2.0000 mg | Freq: Once | INTRAVENOUS | Status: AC
  Administered 2021-09-13: 2 mg via INTRAVENOUS
  Filled 2021-09-13: qty 1

## 2021-09-13 MED ORDER — OXYCODONE-ACETAMINOPHEN 5-325 MG PO TABS
1.0000 | ORAL_TABLET | Freq: Once | ORAL | Status: AC
  Administered 2021-09-13: 1 via ORAL
  Filled 2021-09-13: qty 1

## 2021-09-13 MED ORDER — HEPARIN (PORCINE) 25000 UT/250ML-% IV SOLN
2400.0000 [IU]/h | INTRAVENOUS | Status: DC
Start: 2021-09-13 — End: 2021-09-15
  Administered 2021-09-13: 1200 [IU]/h via INTRAVENOUS
  Administered 2021-09-14: 1900 [IU]/h via INTRAVENOUS
  Administered 2021-09-15: 2400 [IU]/h via INTRAVENOUS
  Filled 2021-09-13 (×5): qty 250

## 2021-09-13 MED ORDER — ALTEPLASE 2 MG IJ SOLR: 2.0000 mg | Freq: Once | INTRAMUSCULAR | Status: DC

## 2021-09-13 MED ORDER — SODIUM CHLORIDE 0.9 % IV BOLUS
500.0000 mL | Freq: Once | INTRAVENOUS | Status: AC
  Administered 2021-09-13: 500 mL via INTRAVENOUS

## 2021-09-13 NOTE — Progress Notes (Signed)
?  Attempted echocardiogram at 10:22am. Will attempt at later time . ? ? ?Latha Staunton  Veva Holes ?09/13/2021, 10:34 AM ?

## 2021-09-13 NOTE — Progress Notes (Signed)
?PROGRESS NOTE ? ? ? ?Latoya [name]  HYW:[id number] DOB: 03-Mar-1986 DOA: 08/02/2021 ?PCP: Pcp, No  ?Chief Complaint  ?Patient presents with  ? Drug Problem  ? Back Pain  ? Leg Pain  ? Dysuria  ? ? ?Brief Narrative:  ?Patient is a 36 years old female with past medical history of MSSA tricuspid valve endocarditis in 2020 as well as MRSA discitis and osteomyelitis in 2021 with ongoing IV drug abuse presented to hospital with low back pain, cough and fever.  MRI of the lumbar spine was negative for infection but blood culture was positive for MRSA bacteremia with tricuspid valve vegetation and septic pulmonary emboli.  Patient was seen by CT surgery and underwent angio VAC procedure, but the discovery of a significant PFO required the abortion of this procedure.  She has been seen by IDA and plan is to continue antibiotic until 09/16/2021 and to repeat TTE about 5 to 7 days prior to discontinuation of antibiotics. ?Subsequently patient developed fever along with chest pain.  CT angiogram of the chest raised concern for PE.  Septic emboli was also considered.  ID will need to reevaluate patient.  ? ? ?Assessment & Plan: ?  ?Principal Problem: ?  MRSA bacteremia ?Active Problems: ?  Endocarditis of tricuspid valve ?  History of bacterial endocarditis ?  Septic pulmonary embolism ([place]) ?  Opioid use disorder ?  IV drug abuse ([place]) ?  Acute pulmonary embolism ([place]) ?  Chronic lumbar pain ?  Iron deficiency anemia ?  Ambulatory dysfunction ?  Chronic pain ?  History of lice ? ? ?* MRSA bacteremia ?Treating bacteremia as above ? ?Septic pulmonary embolism ([place]) ?With small bilateral effusions. ? ?History of bacterial endocarditis ?TV in 2020, MSSA, got angiovac at [facility]. Treated with Ancef at that time. 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 ?-- as above ? ?Endocarditis of tricuspid valve ?Patient presenting to the ED with back pain and fevers in the setting of  continued IV drug abuse.  Met criteria for severe sepsis with endorgan failure and hypotension on admission complicated by pulmonary septic emboli and MRSA bacteremia with tricuspid valve endocarditis on echocardiogram. ?-Patient was seen by infectious disease and placed on antibiotics. ?--CT surgery c/s, underwent angio vac, but procedure was aborted due to positive bubble study concerning for PFO with further investigation revealing connection between R and L atrium with flows from R to L ?-It appears that patient was supposed to complete her antibiotic course on May 13.  She was not a candidate for home antibiotic treatment. ?In view of recurrence of fever we will request ID to reevaluate this patient.  Blood cultures were sent last night and are pending. ?--initial blood cultures from 3/29 with MRSA.  repeat blood cultures 4/2; 1/4 positive for Staph epidermidis, likely contaminant; otherwise remaining bottles no growth x5 days ?-- Patient remains on vancomycin. ?--repeat echo shows mobile lateral tricuspid valve vegetation. ?Patient complained of chest pain overnight with fever and chills.  Had worsening WBC.  Chest x-ray showed interval decrease in size and number of the bilateral pulmonary nodular opacities. ?CT angiogram did raise concern for acute pulmonary embolism.  We will wait on lower extremity Doppler studies. ?Patient was started on IV heparin which has been continued for now.   ? ?Acute pulmonary embolism ([place]) ?Bilateral pulmonary emboli noted on CT angiogram with evidence for right heart strain.  Lower extremity Doppler studies pending.  Continue IV heparin for  now.  Septic emboli is a differential. ?Recent echocardiogram on 5/8 did not show any evidence for right heart strain.  Echocardiogram to be repeated. ? ?IV drug abuse ([place]) ?Counseled on need for complete cessation given her recurrent tricuspid valve endocarditis now with MRSA ? ?Opioid use disorder ?Could consider suboxone, though need  follow up and would need her to be agreeable with induction here in hospital.  No decision on this yet.  Currently on oxycodone prn -> decrease this to 7.5 q6 prn. ? ? ?Chronic lumbar pain ?MR T and L spine unremarkable ? ?Chronic pain ?Started on baclofen during this admission, this is being tapered off.  ?Will continue cymbalta ?Schedule tylenol ?Trial lidocaine patch for low back pain ?Trial voltaren gel for lower extremity pain ?Was started on lyrica, it looks like during this hospitalization as well.   ?Oxycodone prn ? ? ? ?Ambulatory dysfunction ?Continue PT/OT ? ?Iron deficiency anemia ?Iron panel with serum iron of 22, transferrin saturation 7, ferritin 51 and TIBC 312. ?--Ferrous gluconate 324 mg p.o. daily ? ?History of lice ?Not POA, likely brought in from outside visitors ?Patient completed permethrin on 08/31/2021.  ?Needs repeat treatment, she's resistant with matted hair, continue to discus, permethrin ordered but refused on 5/7 ?Unfortunately patient's belongings were placed into plastic bags but were not treated at that time, have instructed patient that she is not open these bags while in the hospital as they are considered contaminated ? ?Hyponatremia-resolved as of 08/27/2021 ?resolved ? ? ? ?DVT prophylaxis: xarelto ?Code Status: full ?Family Communication: none ?Disposition: Return home when improved ? ?Status is: Inpatient ?Remains inpatient appropriate because: need for iv abx ?  ?Consultants:  ?ID ?Ct surgery ?cardiology ? ?Procedures:  ?Echo ?IMPRESSIONS  ? ? ? 1. Cannot R/O small oscillating density on TV; suggest TEE to further  ?assess.  ? 2. Left ventricular ejection fraction, by estimation, is 60 to 65%. The  ?left ventricle has normal function. The left ventricle has no regional  ?wall motion abnormalities. Left ventricular diastolic parameters were  ?normal.  ? 3. Right ventricular systolic function is normal. The right ventricular  ?size is normal. There is normal pulmonary artery  systolic pressure.  ? 4. The mitral valve is normal in structure. No evidence of mitral valve  ?regurgitation. No evidence of mitral stenosis.  ? 5. The aortic valve is tricuspid. Aortic valve regurgitation is not  ?visualized. No aortic stenosis is present.  ? 6. The inferior vena cava is normal in size with greater than 50%  ?respiratory variability, suggesting right atrial pressure of 3 mmHg.  ? ?Comparison(s): No prior Echocardiogram.  ? ?TEE ?IMPRESSIONS  ? ? ? 1. Left ventricular ejection fraction, by estimation, is 65 to 70%. The  ?left ventricle has normal function.  ? 2. Right ventricular systolic function is mildly reduced. The right  ?ventricular size is mildly enlarged.  ? 3. Left atrial size was mildly dilated. No left atrial/left atrial  ?appendage thrombus was detected.  ? 4. Right atrial size was moderately dilated.  ? 5. The mitral valve is normal in structure. Trivial mitral valve  ?regurgitation.  ? 6. Large highly mobile 1.8 cm vegetation seems to arise from lateral cusp  ?of TV . The tricuspid valve is abnormal. Tricuspid valve regurgitation is  ?severe.  ? 7. The aortic valve is tricuspid. Aortic valve regurgitation is not  ?visualized. No aortic stenosis is present.  ? ?Antimicrobials:  ?Anti-infectives (From admission, onward)  ? ? Start  Dose/Rate Route Frequency Ordered Stop  ? 08/29/21 2200  vancomycin (VANCOREADY) IVPB 1750 mg/350 mL       ? 1,750 mg ?175 mL/hr over 120 Minutes Intravenous Every 24 hours 08/29/21 1121    ? 08/21/21 1800  vancomycin (VANCOREADY) IVPB 750 mg/150 mL  Status:  Discontinued       ? 750 mg ?150 mL/hr over 60 Minutes Intravenous Every 12 hours 08/21/21 1344 08/21/21 1347  ? 08/21/21 1800  vancomycin (VANCOCIN) IVPB 1000 mg/200 mL premix  Status:  Discontinued       ? 1,000 mg ?200 mL/hr over 60 Minutes Intravenous Every 12 hours 08/21/21 1347 08/29/21 1121  ? 08/15/21 0600  vancomycin (VANCOCIN) IVPB 1000 mg/200 mL premix  Status:  Discontinued       ? 1,000  mg ?200 mL/hr over 60 Minutes Intravenous Every 12 hours 08/14/21 2006 08/21/21 1344  ? 08/09/21 1000  vancomycin (VANCOREADY) IVPB 750 mg/150 mL  Status:  Discontinued       ? 750 mg ?150 mL/hr over 60 Minutes

## 2021-09-13 NOTE — Progress Notes (Signed)
Mobility Specialist Progress Note ? ? 09/13/21 1800  ?Mobility  ?Activity Refused mobility  ? ?Pt requesting not to work today d/t CP. RN advising to try again tomorrow. Will f/u tomorrow if time permits.   ? ?Frederico Hamman ?Mobility Specialist ?Phone Number [phone] ? ?

## 2021-09-13 NOTE — Progress Notes (Signed)
?   ? ? ? ? ?[facility] ? ?Date of Admission:  08/02/2021    ? ?Principal Problem: ?  MRSA bacteremia ?Active Problems: ?  Iron deficiency anemia ?  Opioid use disorder ?  Endocarditis of tricuspid valve ?  IV drug abuse ([place]) ?  History of bacterial endocarditis ?  Septic pulmonary embolism ([place]) ?  Chronic lumbar pain ?  Ambulatory dysfunction ?  History of lice ?  Chronic pain ?  Acute pulmonary embolism ([place]) ?     ?    ?Assessment: ?89 YF transferred from [facility] for MRSA tricuspid valve endocarditis. She is currently on vancomycin. Transferred from [facility] for angiovac.  ?#MRSA native TV endocarditis complicated with septic pulmonary emboli with severe MR ?#lower back pain negative thoracolumbar mri ?#IVDA ?#Tobacco abuse ?-Hx of MSSA TV endocarditis SP angiovac and treated with cefazolin in 2020 at [facility] ?-MRSA L4-L5 facet joint septic arthritis in 07/2019 ?-Hep B surface ag negative, HCV RNA negative, HIV negative ?-Angiovac on 4/7 was aborted as evidence of PFO noted ?-doing well on vancomycin (6wk eot 5/13) ?-id called for new fever/cp found to have bilateral large pe and right heart strain ? ?Discussed with radiology -- more likely primary PE rather than septic. Agree given end of tx with improved cavitary lesion/back pain and beyond initial 2 week tx ? ?-no contraindication for anticoagulation from id stand point ? ?Recommendations: ?--f/u repeat blood cx for this episode of fever, but suspect due to PE ?- if cultures negative can finish abx on 5/13 ?- anticoagulation per primary team for pe; given the right heart strain finding and rather large size, likely would need regardless of type of PE ?- lice was seen in her hair again and treatment topical is planned per primary team ?- discussed with primary team/radiology ? ?----------- ?Addendum ?Tte repeat reviewed -- larger size vegetation  ? ?-- this alone does not define treatment failure. However, it is known that larger  persistent vegetation after treatment does have higher risk of embolic complication ? ?-- Would benefit from reengagement of cts  ? ?Microbiology:   ?Antibiotics: ?Vancomycin 3/29-p ?Cefepime 3/29-3/30 ?Metronidazole 3/29-3/30 ? ?Cultures: ?Blood ?3/21 1/1 MRSA ?4/2 1/2 staph epi ? ? ? ?SUBJECTIVE: ?Chest pain acute onset and recurrent fever ?Chest ct bilateral pe large/lobar ?Improved/resolved back pain ?No dyspnea, orthopnea, pnd, leg swelling ?No rash/legs pain ?No focal weakness/numbness ? ? ?Review of Systems: ?Review of Systems  ?All other systems reviewed and are negative. ? ? ?Scheduled Meds: ? acetaminophen  500 mg Oral QID  ? baclofen  5 mg Oral TID  ? Followed by  ? [START ON 09/15/2021] baclofen  5 mg Oral BID  ? Followed by  ? [START ON 09/19/2021] baclofen  5 mg Oral QHS  ? Chlorhexidine Gluconate Cloth  6 each Topical Daily  ? diclofenac Sodium  2 g Topical QID  ? DULoxetine  40 mg Oral Daily  ? feeding supplement  1 Container Oral BID BM  ? feeding supplement  237 mL Oral Q24H  ? ferrous gluconate  324 mg Oral Q breakfast  ? Gerhardt's butt cream   Topical BID  ? lidocaine  1 patch Transdermal Q24H  ? magnesium oxide  400 mg Oral BID  ? multivitamin with minerals  1 tablet Oral Daily  ? nicotine  21 mg Transdermal Daily  ? pantoprazole  40 mg Oral Daily  ? permethrin   Topical Once  ? pregabalin  75 mg Oral  TID  ? senna-docusate  1 tablet Oral Daily  ? thiamine  100 mg Oral Daily  ? ?Continuous Infusions: ? sodium chloride Stopped (09/08/21 0257)  ? heparin 1,450 Units/hr (09/13/21 1145)  ? lactated ringers Stopped (08/11/21 1336)  ? vancomycin Stopped (09/13/21 0004)  ? ?PRN Meds:.sodium chloride, acetaminophen, albuterol, benzonatate, bisacodyl, docusate sodium, menthol-cetylpyridinium, naLOXone (NARCAN)  injection, oxyCODONE, senna-docusate ?Allergies  ?Allergen Reactions  ? Naltrexone Anxiety  ?  Severe anxiety, restlessness, vomiting  ? ? ?OBJECTIVE: ?Vitals:  ? 09/12/21 2344 09/13/21 [id number]  09/13/21 0546 09/13/21 0826  ?BP: 108/73 119/72  112/73  ?Pulse: 99 97  96  ?Resp: 20 18  17   ?Temp: (!) 100.7 ?F (38.2 ?C) (!) 101.7 ?F (38.7 ?C) (!) 101.6 ?F (38.7 ?C) 98.2 ?F (36.8 ?C)  ?TempSrc: Oral Oral Oral Oral  ?SpO2: 100% 100%  96%  ?Weight:      ?Height:      ? ?Body mass index is 31.14 kg/m?. ? ?Physical exam: ?General/constitutional: no distress, pleasant ?HEENT: Normocephalic, PER, Conj Clear, EOMI, Oropharynx clear ?Neck supple ?CV: rrr no mrg ?Lungs: clear to auscultation, normal respiratory effort ?Abd: Soft, Nontender ?Ext: no edema ?Skin: No Rash ?Neuro: nonfocal ?MSK: no peripheral joint swelling/tenderness/warmth; back spines nontender ? ? ?Central line presence: left ue picc site no purulence/erythema ? ? ? ? ?Lab Results ?Lab Results  ?Component Value Date  ? WBC 14.9 (H) 09/13/2021  ? HGB 8.7 (L) 09/13/2021  ? HCT 27.5 (L) 09/13/2021  ? MCV 77.0 (L) 09/13/2021  ? PLT 191 09/13/2021  ?  ?Lab Results  ?Component Value Date  ? CREATININE 0.64 09/13/2021  ? BUN 10 09/13/2021  ? NA 133 (L) 09/13/2021  ? K 3.6 09/13/2021  ? CL 102 09/13/2021  ? CO2 25 09/13/2021  ?  ?Lab Results  ?Component Value Date  ? ALT 11 09/13/2021  ? AST 13 (L) 09/13/2021  ? ALKPHOS 71 09/13/2021  ? BILITOT 1.1 09/13/2021  ?  ? ?Imaging: ?I have reviewed/incorporated imaging finding into today's assessment and plan ? ? ?5/9 chest ct ?1. Bilateral pulmonary emboli, most pronounced on the left involving ?the left pulmonary artery, lobar, segmental, and subsegmental ?arteries. The right ventricle is distended suggesting right heart ?strain. ?2. Patchy infiltrates in the lungs bilaterally with new perihilar ?consolidation on the left. A few residual cavitary lesions are noted ?in the lungs bilaterally, compatible with septic emboli. ?3. Small bilateral pleural effusions. ?4. Splenomegaly. ?5. Mild cardiomegaly with trace pericardial effusion. ? ?5/10 le u/s duplex ?No dvt bilateral LE ? ?5/10 tte ?Compared to prior TTE on  09/11/21, a tricuspid valve  ?vegetation is again visualized and appears larger on current study. The RV appears enlarged with evidence of RV volume overload.  ? ?Jabier Mutton, MD ?[facility] ?[facility] ?09/13/2021, 3:44 PM  ?

## 2021-09-13 NOTE — Progress Notes (Signed)
PT Cancellation Note ? ?Patient Details ?Name: Latoya [name] ?MRN: [id number] ?DOB: Dec 05, 1985 ? ? ?Cancelled Treatment:    Reason Eval/Treat Not Completed: Other (comment) Pt declined therapy session; reporting chest pain and malaise. RN aware. ? ?[name], PT, DPT ?Acute Rehabilitation Services ?Pager [phone] ?Office [phone] ? ? ? ?[name] ?09/13/2021, 2:13 PM ?

## 2021-09-13 NOTE — Assessment & Plan Note (Addendum)
Patient complained of chest pain fever chills on 5/9. ?Chest x-ray did not show any new findings.  A CT angiogram was done which raised concern for bilateral pulmonary emboli with right heart strain.  Lower extremity Doppler study negative for DVT. ?Limited echocardiogram does show right ventricular enlargement but systolic function noted to be normal.   ?After further discussions with ID and radiology it was determined that this could be pulmonary embolism rather than a septic emboli.  Patient started on IV heparin.  We will leave her on IV heparin for now and then transition to oral anticoagulants once she is more stable. ?

## 2021-09-13 NOTE — Progress Notes (Signed)
ANTICOAGULATION CONSULT NOTE  ? ?Pharmacy Consult for Heparin  ?Indication: New onset pulmonary embolus vs septic emboli with right heart strain ? ?Allergies  ?Allergen Reactions  ? Naltrexone Anxiety  ?  Severe anxiety, restlessness, vomiting  ? ? ?Patient Measurements: ?Height: 5\' 6"  (167.6 cm) ?Weight: 87.5 kg (192 lb 14.4 oz) ?IBW/kg (Calculated) : 59.3 ? ? ?Vital Signs: ?Temp: 98.2 ?F (36.8 ?C) (05/10 04-20-1982) ?Temp Source: Oral (05/10 04-20-1982) ?BP: 112/73 (05/10 04-20-1982) ?Pulse Rate: 96 (05/10 0826) ? ?Labs: ?Recent Labs  ?  09/12/21 ?0920 09/13/21 ?0155 09/13/21 ?1020  ?HGB 9.2* 8.7*  --   ?HCT 28.8* 27.5*  --   ?PLT 204 191  --   ?APTT  --   --  46*  ?HEPARINUNFRC  --   --  0.38  ?CREATININE 0.63 0.64  --   ? ? ? ?Estimated Creatinine Clearance: 110.4 mL/min (by C-G formula based on SCr of 0.64 mg/dL). ? ? ?Medical History: ?Past Medical History:  ?Diagnosis Date  ? Heroin addiction (H[facility])   ? IV drug abuse ([facility])   ? ? ?Assessment: ?36 y/o F with prolonged stay for bacteremia/endocarditis. CT angio this past evening with likely new onset PE. Cannot exclude septic emboli; however, there does appear to be right heart strain. Starting heparin. Pt has been on DVT prophylaxis dose of Xarelto as indicated by low level anti-xa level. Aptt below therapeutic range so level are not correlated. Will rely on aptt for monitoring for now. No bleeding or IV issues noted.  ? ?Goal of Therapy:  ?Heparin level 0.3-0.7 units/ml ?aPTT 66-102 seconds ?Monitor platelets by anticoagulation protocol: Yes ?  ?Plan:  ?Increase heparin drip to 1450 units/hr ?Recheck aptt in 6 hours ?Daily CBC, heparin level, and aPTT ?Monitor for bleeding ? ?20 PharmD., BCPS ?Clinical Pharmacist ?09/13/2021 11:37 AM ? ?

## 2021-09-13 NOTE — Progress Notes (Signed)
?  Echocardiogram ?2D Echocardiogram has been performed. ? ?Latoya Eve  Romeo [name] ?09/13/2021, 1:38 PM ?

## 2021-09-13 NOTE — Progress Notes (Addendum)
ANTICOAGULATION CONSULT NOTE - Initial Consult ? ?Pharmacy Consult for Heparin  ?Indication: New onset pulmonary embolus vs septic emboli with right heart strain ? ?Allergies  ?Allergen Reactions  ? Naltrexone Anxiety  ?  Severe anxiety, restlessness, vomiting  ? ? ?Patient Measurements: ?Height: 5\' 6"  (167.6 cm) ?Weight: 87.5 kg (192 lb 14.4 oz) ?IBW/kg (Calculated) : 59.3 ? ? ?Vital Signs: ?Temp: 100.7 ?F (38.2 ?C) (05/09 2344) ?Temp Source: Oral (05/09 2344) ?BP: 108/73 (05/09 2344) ?Pulse Rate: 99 (05/09 2344) ? ?Labs: ?Recent Labs  ?  09/12/21 ?0920  ?HGB 9.2*  ?HCT 28.8*  ?PLT 204  ?CREATININE 0.63  ? ? ?Estimated Creatinine Clearance: 110.4 mL/min (by C-G formula based on SCr of 0.63 mg/dL). ? ? ?Medical History: ?Past Medical History:  ?Diagnosis Date  ? Heroin addiction (H[facility])   ? IV drug abuse ([facility])   ? ? ?Assessment: ?36 y/o F with prolonged stay for bacteremia/endocarditis. CT angio this past evening with likely new onset PE. Cannot exclude septic emboli; however, there does appear to be right heart strain. Starting heparin. Pt has been on DVT prophylaxis dose of Xarelto-anticipate using aPTT to dose heparin for now. Labs above.   ? ?Goal of Therapy:  ?Heparin level 0.3-0.7 units/ml ?aPTT 66-102 seconds ?Monitor platelets by anticoagulation protocol: Yes ?  ?Plan:  ?Start heparin drip at 1200 units/hr ?Heparin level and aPTT in 6-8 hours ?Daily CBC, heparin level, and aPTT ?Monitor for bleeding ? ?20, PharmD, BCPS ?Clinical Pharmacist ?Phone: [phone] ? ? ? ?

## 2021-09-13 NOTE — Progress Notes (Addendum)
Pharmacy Antibiotic Note ? ?Latoya [name] is a 36 y.o. female admitted on 08/02/2021 with MRSA bacteremia-  septic pulmonary emboli/endocarditis. Pharmacy has been consulted for Vancomycin dosing. Plans noted to continue antibiotics until 5/13 with repeat TTE today 5/10. Patient is not a candidate for OPAT.  ? ?Patient febrile overnight to 101.7, wbc down slightly to 14.9. PCT 0.15.  ?SCr remains stable at 0.6.  ? ?Vancomycin levels last checked 5/8 with calculated AUC of 445 within range.  ? ?Plan: ?-Continue vancomycin 1750mg  IV q24h through 5/13 ?Repeat levels as indicated ? ? ? ?Height: 5\' 6"  (167.6 cm) ?Weight: 87.5 kg (192 lb 14.4 oz) ?IBW/kg (Calculated) : 59.3 ? ?Temp (24hrs), Avg:100 ?F (37.8 ?C), Min:98.1 ?F (36.7 ?C), Max:101.7 ?F (38.7 ?C) ? ?Recent Labs  ?Lab 09/07/21 ? 09/11/21 ?0106 09/11/21 ?2135 09/12/21 ?0920 09/13/21 ?0155  ?WBC 10.5  --   --  16.0* 14.9*  ?CREATININE 0.62  --   --  0.63 0.64  ?VANCOTROUGH  --   --  5*  --   --   ?VANCOPEAK  --  42*  --   --   --   ? ?  ?Estimated Creatinine Clearance: 110.4 mL/min (by C-G formula based on SCr of 0.64 mg/dL).   ? ?Allergies  ?Allergen Reactions  ? Naltrexone Anxiety  ?  Severe anxiety, restlessness, vomiting  ? ? ?Antimicrobials this admission: ?Cefepime 3/29 >> 3/30 ?Flagyl 3/29 >> 3/30 ?Vancomycin 3/29 >>(5/13) ? ?Dose adjustments this admission: ?3/30 Vanc 750 >> 1250 q12 w/ new wt and improving SCr  ? ?Microbiology results: ?3/29 Influenza/COVID>> neg ?3/29 MRSA PCR >> positive ?3/29 BCx >> GPC in 4/4 bottles (BCID MRSA) ?3/31 BCX: MRSA - only 1 set drawn  ?4/2 BCx: NG ? ?Thank you for allowing pharmacy to participate in this patient's care. ? ?4/29 PharmD., BCPS ?Clinical Pharmacist ?09/13/2021 11:43 AM ? ?

## 2021-09-13 NOTE — Progress Notes (Signed)
ANTICOAGULATION CONSULT NOTE  ? ?Pharmacy Consult for Heparin  ?Indication: New onset pulmonary embolus vs septic emboli with right heart strain ? ?Allergies  ?Allergen Reactions  ? Naltrexone Anxiety  ?  Severe anxiety, restlessness, vomiting  ? ? ?Patient Measurements: ?Height: 5\' 6"  (167.6 cm) ?Weight: 87.5 kg (192 lb 14.4 oz) ?IBW/kg (Calculated) : 59.3 ? ? ?Vital Signs: ?Temp: 101 ?F (38.3 ?C) (05/10 1659) ?Temp Source: Oral (05/10 1659) ?BP: 116/100 (05/10 1659) ?Pulse Rate: 111 (05/10 1659) ? ?Labs: ?Recent Labs  ?  09/12/21 ?0920 09/13/21 ?0155 09/13/21 ?1020 09/13/21 ?1916  ?HGB 9.2* 8.7*  --   --   ?HCT 28.8* 27.5*  --   --   ?PLT 204 191  --   --   ?APTT  --   --  46* 43*  ?HEPARINUNFRC  --   --  0.38  --   ?CREATININE 0.63 0.64  --   --   ? ? ? ?Estimated Creatinine Clearance: 110.4 mL/min (by C-G formula based on SCr of 0.64 mg/dL). ? ? ?Medical History: ?Past Medical History:  ?Diagnosis Date  ? Heroin addiction (HCC)   ? IV drug abuse ([facility])   ? ? ?Assessment: ?36 y/o F with prolonged stay for bacteremia/endocarditis. CT angio with likely new onset PE. Cannot exclude septic emboli; however, there does appear to be right heart strain. Heparin started. Pt has been on DVT prophylaxis dose of Xarelto as indicated by low level anti-xa level. aPTT below therapeutic range so levels are not correlated. Will rely on aptt for monitoring for now. No bleeding or IV issues noted.  ? ?aPTT 43 sec (subtherapeutic) on gtt at 1450 units/hr. No issues with line or bleeding reported per RN. ? ?Goal of Therapy:  ?Heparin level 0.3-0.7 units/ml ?aPTT 66-102 seconds ?Monitor platelets by anticoagulation protocol: Yes ?  ?Plan:  ?Increase heparin drip to 1700 units/hr ?Recheck aptt in 6 hours ? ?20, PharmD, BCPS ?Please see amion for complete clinical pharmacist phone list ?09/13/2021 8:49 PM ? ?

## 2021-09-13 NOTE — Progress Notes (Addendum)
HOSPITAL MEDICINE OVERNIGHT EVENT NOTE   ? ?Followed up on noncontrast CT imaging of the head per my discussion with Dr. Lowell Guitar, patient's day provider.   ?CT imaging of the head reveals no evidence of acute intracranial abnormality or any evidence of septic emboli. ? ?Considering the significant bilateral pulmonary emboli noted on CT angiogram of the chest several hours ago, will initiate intravenous heparin.  Pharmacy heparin consult has been placed.  I have additionally called pharmacy to ask for this medication to be initiated as quickly as possible.  Low-dose Xarelto the patient was previously on has been discontinued. ? ?Per my discussions with Dr. Lowell Guitar, radiology was questioning if whether the CT findings were actually septic emboli versus pulmonary emboli.  To help make a more definitive diagnosis the patient does have bilateral lower extremity ultrasounds pending to identify if patient has any evidence of lower extremity DVTs.  If the patient does have a DVT this would clinch the case for pulmonary emboli. ? ?Per discussions with radiology earlier in the evening, there is also some concern for right heart strain however this is a surprising finding considering echocardiogram performed on 5/8 revealed no evidence of right heart dysfunction.  Echocardiogram will be repeated later this morning to reassess.  If patient does indeed have significant right heart strain on echocardiogram day team can discuss potential treatment options such as thrombectomy with PCCM and IR. ? ?Marinda Elk  MD ?[facility]  ? ?ADDENDUM (5/10 5:40am) ? ?Patient complaining of chest and neck pain in addition to exhibiting ongoing fevers that are persisting despite administration of 325 mg of Tylenol earlier in the morning.  We will go ahead and administer a dose of Percocet which should hopefully address the fever and the pain. ? ?Deno Lunger Jane Birkel ? ? ? ? ? ? ? ? ? ? ? ? ? ?

## 2021-09-14 DIAGNOSIS — I2609 Other pulmonary embolism with acute cor pulmonale: Secondary | ICD-10-CM | POA: Diagnosis not present

## 2021-09-14 DIAGNOSIS — D509 Iron deficiency anemia, unspecified: Secondary | ICD-10-CM | POA: Diagnosis not present

## 2021-09-14 DIAGNOSIS — B9562 Methicillin resistant Staphylococcus aureus infection as the cause of diseases classified elsewhere: Secondary | ICD-10-CM | POA: Diagnosis not present

## 2021-09-14 DIAGNOSIS — R7881 Bacteremia: Secondary | ICD-10-CM | POA: Diagnosis not present

## 2021-09-14 LAB — CBC
HCT: 27.4 % — ABNORMAL LOW (ref 36.0–46.0)
Hemoglobin: 8.3 g/dL — ABNORMAL LOW (ref 12.0–15.0)
MCH: 23.6 pg — ABNORMAL LOW (ref 26.0–34.0)
MCHC: 30.3 g/dL (ref 30.0–36.0)
MCV: 78.1 fL — ABNORMAL LOW (ref 80.0–100.0)
Platelets: 208 10*3/uL (ref 150–400)
RBC: 3.51 MIL/uL — ABNORMAL LOW (ref 3.87–5.11)
RDW: 18.9 % — ABNORMAL HIGH (ref 11.5–15.5)
WBC: 14 10*3/uL — ABNORMAL HIGH (ref 4.0–10.5)
nRBC: 0 % (ref 0.0–0.2)

## 2021-09-14 LAB — PROCALCITONIN: Procalcitonin: 0.14 ng/mL

## 2021-09-14 LAB — APTT
aPTT: 44 seconds — ABNORMAL HIGH (ref 24–36)
aPTT: 59 seconds — ABNORMAL HIGH (ref 24–36)

## 2021-09-14 LAB — HEPARIN LEVEL (UNFRACTIONATED)
Heparin Unfractionated: <0.1 IU/mL — ABNORMAL LOW (ref 0.30–0.70)
Heparin Unfractionated: <0.1 IU/mL — ABNORMAL LOW (ref 0.30–0.70)
Heparin Unfractionated: 0.1 IU/mL — ABNORMAL LOW (ref 0.30–0.70)

## 2021-09-14 LAB — LACTIC ACID, PLASMA
Lactic Acid, Venous: 0.6 mmol/L (ref 0.5–1.9)
Lactic Acid, Venous: 1.3 mmol/L (ref 0.5–1.9)

## 2021-09-14 MED ORDER — HEPARIN BOLUS VIA INFUSION
2000.0000 [IU] | Freq: Once | INTRAVENOUS | Status: AC
  Administered 2021-09-14: 2000 [IU] via INTRAVENOUS
  Filled 2021-09-14: qty 2000

## 2021-09-14 MED ORDER — KETOROLAC TROMETHAMINE 15 MG/ML IJ SOLN
15.0000 mg | Freq: Once | INTRAMUSCULAR | Status: AC | PRN
  Administered 2021-09-14: 15 mg via INTRAVENOUS
  Filled 2021-09-14: qty 1

## 2021-09-14 MED ORDER — SODIUM CHLORIDE 0.9 % IV SOLN
INTRAVENOUS | Status: AC
Start: 2021-09-14 — End: 2021-09-15

## 2021-09-14 MED ORDER — MORPHINE SULFATE (PF) 2 MG/ML IV SOLN: 2.0000 mg | INTRAVENOUS | Status: DC | PRN

## 2021-09-14 MED ORDER — MORPHINE SULFATE (PF) 2 MG/ML IV SOLN
2.0000 mg | Freq: Four times a day (QID) | INTRAVENOUS | Status: DC | PRN
  Administered 2021-09-14 – 2021-09-16 (×8): 2 mg via INTRAVENOUS
  Filled 2021-09-14 (×8): qty 1

## 2021-09-14 MED ORDER — HEPARIN BOLUS VIA INFUSION
1000.0000 [IU] | Freq: Once | INTRAVENOUS | Status: AC
  Administered 2021-09-14: 1000 [IU] via INTRAVENOUS
  Filled 2021-09-14: qty 1000

## 2021-09-14 NOTE — Progress Notes (Signed)
ANTICOAGULATION CONSULT NOTE  ? ?Pharmacy Consult for Heparin  ?Indication: New onset pulmonary embolus vs septic emboli with right heart strain ? ?Allergies  ?Allergen Reactions  ? Naltrexone Anxiety  ?  Severe anxiety, restlessness, vomiting  ? ? ?Patient Measurements: ?Height: 5\' 6"  (167.6 cm) ?Weight: 87.5 kg (192 lb 14.4 oz) ?IBW/kg (Calculated) : 59.3 ? ? ?Vital Signs: ?Temp: 98.3 ?F (36.8 ?C) (05/11 07-03-1974) ?Temp Source: Oral (05/11 0345) ?BP: 102/76 (05/11 07-03-1974) ?Pulse Rate: 103 (05/11 0923) ? ?Labs: ?Recent Labs  ?  09/12/21 ?0920 09/13/21 ?0155 09/13/21 ?1020 09/13/21 ?1916 09/14/21 ?0330 09/14/21 ?11/14/21  ?HGB 9.2* 8.7*  --   --   --  8.3*  ?HCT 28.8* 27.5*  --   --   --  27.4*  ?PLT 204 191  --   --   --  208  ?APTT  --   --  46* 43* 44*  --   ?HEPARINUNFRC  --   --  0.38  --  <0.10* <0.10*  ?CREATININE 0.63 0.64  --   --   --   --   ? ? ?Estimated Creatinine Clearance: 110.4 mL/min (by C-G formula based on SCr of 0.64 mg/dL). ? ?Medical History: ?Past Medical History:  ?Diagnosis Date  ? Heroin addiction (HCC)   ? IV drug abuse ([facility])   ? ? ?Assessment: ?36 y/o F with prolonged stay for bacteremia/endocarditis. CT angio with likely new onset PE. Cannot exclude septic emboli; however, there does appear to be right heart strain. Heparin started. Pt has been on DVT prophylaxis dose of Xarelto (last dose 5/9 @ 1800) as indicated by low level anti-xa level. aPTT below therapeutic range so levels are not correlated. Will rely on aptt for monitoring for now. No bleeding or IV issues noted.  ? ?Heparin level 0.10  and aPTT 59 sec (subtherapeutic) on gtt at 1900 units/hr. No issues with line or bleeding reported per RN. Levels not quite correlated, however xarelto appears to have cleared so will adjust based on heparin levels alone.  ? ?Goal of Therapy:  ?Heparin level 0.3-0.7 units/ml ?aPTT 66-102 seconds ?Monitor platelets by anticoagulation protocol: Yes ?  ?Plan:  ?Bolus IV heparin 2000 units  ?Increase heparin  drip to 2150 units/hr ?Recheck heparin level in 6 hours ? ?2151 PharmD., BCPS ?Clinical Pharmacist ?09/14/2021 10:07 AM ? ?

## 2021-09-14 NOTE — Progress Notes (Addendum)
ANTICOAGULATION CONSULT NOTE  ? ?Pharmacy Consult for Heparin  ?Indication: New onset pulmonary embolus vs septic emboli with right heart strain ? ?Allergies  ?Allergen Reactions  ? Naltrexone Anxiety  ?  Severe anxiety, restlessness, vomiting  ? ? ?Patient Measurements: ?Height: 5\' 6"  (167.6 cm) ?Weight: 87.5 kg (192 lb 14.4 oz) ?IBW/kg (Calculated) : 59.3 ? ? ?Vital Signs: ?Temp: 99 ?F (37.2 ?C) (05/11 0345) ?Temp Source: Oral (05/11 0345) ?BP: 96/55 (05/11 0345) ?Pulse Rate: 110 (05/11 0345) ? ?Labs: ?Recent Labs  ?  09/12/21 ?0920 09/13/21 ?0155 09/13/21 ?1020 09/13/21 ?1916 09/14/21 ?0330  ?HGB 9.2* 8.7*  --   --   --   ?HCT 28.8* 27.5*  --   --   --   ?PLT 204 191  --   --   --   ?APTT  --   --  46* 43* 44*  ?HEPARINUNFRC  --   --  0.38  --  <0.10*  ?CREATININE 0.63 0.64  --   --   --   ? ? ?Estimated Creatinine Clearance: 110.4 mL/min (by C-G formula based on SCr of 0.64 mg/dL). ? ?Medical History: ?Past Medical History:  ?Diagnosis Date  ? Heroin addiction (HCC)   ? IV drug abuse (HCC)   ? ? ?Assessment: ?36 y/o F with prolonged stay for bacteremia/endocarditis. CT angio with likely new onset PE. Cannot exclude septic emboli; however, there does appear to be right heart strain. Heparin started. Pt has been on DVT prophylaxis dose of Xarelto (last dose 5/9 @ 1800) as indicated by low level anti-xa level. aPTT below therapeutic range so levels are not correlated. Will rely on aptt for monitoring for now. No bleeding or IV issues noted.  ? ?Heparin level <0.10  and aPTT 44 sec (subtherapeutic) on gtt at 1700 units/hr. No issues with line or bleeding reported per RN. ? ?Goal of Therapy:  ?Heparin level 0.3-0.7 units/ml ?aPTT 66-102 seconds ?Monitor platelets by anticoagulation protocol: Yes ?  ?Plan:  ?Bolus IV heparin 1000 units  ?Increase heparin drip to 1900 units/hr ?Recheck aptt in 6 hours ? ?7/9, PharmD ?Clinical Pharmacist ?09/14/2021 6:10 AM ?Please check AMION for all [facility] Pharmacy numbers ? ? ?

## 2021-09-14 NOTE — Progress Notes (Signed)
Occupational Therapy Treatment ?Patient Details ?Name: Latoya [name] ?MRN: [id number] ?DOB: [birth year] ?Today's Date: 09/14/2021 ? ? ?History of present illness 36 y.o. female admitted 08/02/21 with back pain, hypotension, hyponatremia, leukocytosis and neutrophilia. Found to have bacteremia possible sepitc pulmonary emboli from endocarditis. PMH: IVDU, heroin abuse, ongoing. ?  ?OT comments ? Patient seen with PT to address mobility without an assistive device and due to history or reluctance for participation. Patient able to get to EOB with supervision and was able to donn brace. Patient performed power up from EOB with mod assist of 2 and performed mobility in room with HHA +2 to ween from RW. Patient apprehensive to ambulate without RW but able to perform with HHA +2. Patient in recliner at end of session. Acute OT to continue to follow.    ? ?Recommendations for follow up therapy are one component of a multi-disciplinary discharge planning process, led by the attending physician.  Recommendations may be updated based on patient status, additional functional criteria and insurance authorization. ?   ?Follow Up Recommendations ? Outpatient OT  ?  ?Assistance Recommended at Discharge Frequent or constant Supervision/Assistance  ?Patient can return home with the following ? A little help with walking and/or transfers;A little help with bathing/dressing/bathroom;Help with stairs or ramp for entrance;Assistance with cooking/housework;Assist for transportation ?  ?Equipment Recommendations ? BSC/3in1  ?  ?Recommendations for Other Services   ? ?  ?Precautions / Restrictions Precautions ?Precautions: Fall ?Precaution Booklet Issued: No ?Precaution Comments: Contact precs, lice, back precs for comfort due to pain ?Required Braces or Orthoses: Spinal Brace ?Spinal Brace: Lumbar corset ?Restrictions ?Weight Bearing Restrictions: No  ? ? ?  ? ?Mobility Bed Mobility ?Overal bed mobility: Needs Assistance ?Bed Mobility:  Supine to Sit ?  ?  ?Supine to sit: Supervision, HOB elevated ?  ?  ?General bed mobility comments: able to come to EoB without assist ?  ? ?Transfers ?Overall transfer level: Needs assistance ?Equipment used: 2 person hand held assist ?Transfers: Sit to/from Stand ?Sit to Stand: Mod assist, +2 physical assistance, Min assist ?  ?  ?  ?  ?  ?General transfer comment: mod assist x2 to power up from bed and min assist from second stand from bed with hands on knees to assist with power up ?  ?  ?Balance Overall balance assessment: Needs assistance ?Sitting-balance support: No upper extremity supported, Feet supported ?Sitting balance-Leahy Scale: Good ?  ?  ?Standing balance support: Single extremity supported ?Standing balance-Leahy Scale: Poor ?Standing balance comment: Encouraged her to attempt without RW, pt able to static stand with +1 min guard but needing +2 modA for gait without RW ?  ?  ?  ?  ?  ?  ?  ?  ?  ?  ?  ?   ? ?ADL either performed or assessed with clinical judgement  ? ?ADL   ?  ?  ?  ?  ?  ?  ?  ?  ?  ?  ?  ?  ?  ?  ?  ?  ?  ?  ?  ?General ADL Comments: able to donn corset brace ?  ? ?Extremity/Trunk Assessment   ?  ?  ?  ?  ?  ? ?Vision   ?  ?  ?Perception   ?  ?Praxis   ?  ? ?Cognition Arousal/Alertness: Awake/alert ?Behavior During Therapy: [facility] for tasks assessed/performed ?Overall Cognitive Status: Within Functional Limits for tasks assessed ?  ?  ?  ?  ?  ?  ?  ?  ?  ?  ?  ?  ?  ?  ?  ?  Problem Solving: Slow processing, Requires verbal cues ?General Comments: agreeable to treatment, apprehensive on walking without an AD ?  ?  ?   ?Exercises   ? ?  ?Shoulder Instructions   ? ? ?  ?General Comments VSS on RA, max noted HR 123bpm with ambulation, pt able to don brace with set up behind her  ? ? ?Pertinent Vitals/ Pain       Pain Assessment ?Pain Assessment: Faces ?Pain Score: 8  ?Faces Pain Scale: Hurts a little bit ?Pain Location: back, LLE ?Pain Descriptors / Indicators: Sore ?Pain  Intervention(s): Limited activity within patient's tolerance, Monitored during session, Repositioned ? ?Home Living   ?  ?  ?  ?  ?  ?  ?  ?  ?  ?  ?  ?  ?  ?  ?  ?  ?  ?  ? ?  ?Prior Functioning/Environment    ?  ?  ?  ?   ? ?Frequency ? Min 2X/week  ? ? ? ? ?  ?Progress Toward Goals ? ?OT Goals(current goals can now be found in the care plan section) ? Progress towards OT goals: Progressing toward goals ? ?Acute Rehab OT Goals ?Patient Stated Goal: go to rehab ?OT Goal Formulation: With patient ?Time For Goal Achievement: 09/18/21 ?Potential to Achieve Goals: Good ?ADL Goals ?Pt Will Perform Grooming: with modified independence;standing ?Pt Will Perform Lower Body Dressing: with modified independence;with adaptive equipment;sit to/from stand ?Pt Will Transfer to Toilet: with modified independence;ambulating;bedside commode ?Pt Will Perform Toileting - Clothing Manipulation and hygiene: with modified independence;sit to/from stand ?Additional ADL Goal #1: Pt will Mod I in and OOB for basic ADLs (HOB flat, no rail, leg lifter prn, increased time)  ?Plan Discharge plan remains appropriate   ? ?Co-evaluation ? ? ? PT/OT/SLP Co-Evaluation/Treatment: Yes ?Reason for Co-Treatment: Necessary to address cognition/behavior during functional activity ?PT goals addressed during session: Mobility/safety with mobility ?OT goals addressed during session: ADL's and self-care ?  ? ?  ?AM-PAC OT "6 Clicks" Daily Activity     ?Outcome Measure ? ? Help from another person eating meals?: None ?Help from another person taking care of personal grooming?: A Little ?Help from another person toileting, which includes using toliet, bedpan, or urinal?: A Little ?Help from another person bathing (including washing, rinsing, drying)?: A Little ?Help from another person to put on and taking off regular upper body clothing?: A Little ?Help from another person to put on and taking off regular lower body clothing?: A Little ?6 Click Score: 19 ? ?   ?End of Session Equipment Utilized During Treatment: Back brace ? ?OT Visit Diagnosis: Muscle weakness (generalized) (M62.81);Pain ?  ?Activity Tolerance Patient tolerated treatment well ?  ?Patient Left in chair;with call bell/phone within reach ?  ?Nurse Communication Mobility status ?  ? ?   ? ?Time: 5597-4163 ?OT Time Calculation (min): 30 min ? ?Charges: OT General Charges ?$OT Visit: 1 Visit ?OT Treatments ?$Therapeutic Activity: 8-22 mins ? ?Alfonse Flavors, OTA ?Acute Rehabilitation Services  ?Pager [phone] ?Office [phone] ? ? ?Anni Hocevar Jeannett Senior ?09/14/2021, 2:38 PM ?

## 2021-09-14 NOTE — Progress Notes (Signed)
PT Cancellation Note ? ?Patient Details ?Name: Isyss Espinal ?MRN: [id number] ?DOB: April 18, 1986 ? ? ?Cancelled Treatment:    Reason Eval/Treat Not Completed: (P) Other (comment) Treatment team is trying to facilitate final lice treatment. PT will follow back this afternoon as able. ? ?Airi Copado B. Beverely Risen PT, DPT ?Acute Rehabilitation Services ?Please use secure chat or  ?Call Office [phone] ? ? ? ?[name] ?09/14/2021, 11:02 AM ? ? ?

## 2021-09-14 NOTE — Progress Notes (Signed)
?PROGRESS NOTE ? ? ? ?Latoya [name]  TIR:[id number] DOB: Dec 23, 1985 DOA: 08/02/2021 ?PCP: Pcp, No  ?Chief Complaint  ?Patient presents with  ? Drug Problem  ? Back Pain  ? Leg Pain  ? Dysuria  ? ? ?Brief Narrative:  ?Patient is a 36 years old female with past medical history of MSSA tricuspid valve endocarditis in 2020 as well as MRSA discitis and osteomyelitis in 2021 with ongoing IV drug abuse presented to hospital with low back pain, cough and fever.  MRI of the lumbar spine was negative for infection but blood culture was positive for MRSA bacteremia with tricuspid valve vegetation and septic pulmonary emboli.  Patient was seen by CT surgery and underwent angio VAC procedure, but the discovery of a significant PFO required the abortion of this procedure.  She has been seen by IDA and plan is to continue antibiotic until 09/16/2021 and to repeat TTE about 5 to 7 days prior to discontinuation of antibiotics. ?Subsequently patient developed fever along with chest pain.  CT angiogram of the chest raised concern for PE.  Septic emboli was also considered.  ID will need to reevaluate patient.  ? ? ?Assessment & Plan: ?  ?Principal Problem: ?  MRSA bacteremia ?Active Problems: ?  Endocarditis of tricuspid valve ?  History of bacterial endocarditis ?  Septic pulmonary embolism ([place]) ?  Opioid use disorder ?  IV drug abuse ([place]) ?  Acute pulmonary embolism ([place]) ?  Chronic lumbar pain ?  Iron deficiency anemia ?  Ambulatory dysfunction ?  Chronic pain ?  History of lice ? ? ?* MRSA bacteremia ?Treating bacteremia as above ? ?Septic pulmonary embolism ([place]) ?With small bilateral effusions. ? ?History of bacterial endocarditis ?TV in 2020, MSSA, got angiovac at [facility]. Treated with Ancef at that time. 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 ?See above. ? ?Endocarditis of tricuspid valve ?Patient presenting to the ED with back pain and fevers in the setting of  continued IV drug abuse.  Met criteria for severe sepsis with endorgan failure and hypotension on admission complicated by pulmonary septic emboli and MRSA bacteremia with tricuspid valve endocarditis on echocardiogram. ?-Patient was seen by infectious disease and placed on antibiotics. ?--CT surgery c/s, underwent angio vac, but procedure was aborted due to positive bubble study concerning for PFO with further investigation revealing connection between R and L atrium with flows from R to L ?-It appears that patient is supposed to complete her antibiotic course on May 13.  She was not a candidate for home antibiotic treatment. ?--initial blood cultures from 3/29 with MRSA.  repeat blood cultures 4/2; 1/4 positive for Staph epidermidis, likely contaminant; otherwise remaining bottles no growth x5 days ?-- Patient remains on vancomycin. ?She however started spiking fevers again a few days ago.  ID was reconsulted. ?Cultures from 5/9 are negative so far. ?Fever thought to be secondary to PE.  No change in end date of vancomycin at this time, 5/13. ?Echocardiogram was repeated and shows enlargement in the vegetation.  Discussed with ID.  Cardiothoracic to be reconsulted.  Although it does not appear that they will be able to offer her much. ?Blood pressures are noted to be low overnight.  Procalcitonin 0.14.  Lactic acid level is normal.  Could consider IV hydration. ? ?Acute pulmonary embolism ([place]) ?Patient complained of chest pain fever chills on 5/9. ?Chest x-ray did not show any new findings.  A CT angiogram was done which  raised concern for bilateral pulmonary emboli with right heart strain.  Lower extremity Doppler study negative for DVT. ?Limited echocardiogram does show right ventricular enlargement but systolic function noted to be normal.   ?After further discussions with ID and radiology it was determined that this could be pulmonary embolism rather than a septic emboli.  Patient started on IV heparin.  We  will leave her on IV heparin for now and then transition to oral anticoagulants once she is more stable. ? ?IV drug abuse ([place]) ?Counseled on need for complete cessation given her recurrent tricuspid valve endocarditis now with MRSA ? ?Opioid use disorder ?Has been counseled.  Due to PE she is requiring narcotic pain medications. ? ? ?Chronic lumbar pain ?MR T and L spine unremarkable ? ?Chronic pain ?Started on baclofen during this admission, this is being tapered off.  ?Will continue cymbalta ?Schedule tylenol ?Trial lidocaine patch for low back pain ?Trial voltaren gel for lower extremity pain ?Was started on lyrica, it looks like during this hospitalization as well.   ?Oxycodone prn ? ? ? ?Ambulatory dysfunction ?Continue PT/OT ? ?Iron deficiency anemia ?Iron panel with serum iron of 22, transferrin saturation 7, ferritin 51 and TIBC 312. ?--Ferrous gluconate 324 mg p.o. daily ? ?History of lice ?Not POA, likely brought in from outside visitors ?Patient completed permethrin on 08/31/2021.  To get repeat treatment today. ?Unfortunately patient's belongings were placed into plastic bags but were not treated at that time, have instructed patient that she is not open these bags while in the hospital as they are considered contaminated ? ?Hyponatremia-resolved as of 08/27/2021 ?resolved ? ? ? ?DVT prophylaxis: On IV heparin currently ?Code Status: full ?Family Communication: none ?Disposition: Return home when improved ? ?Status is: Inpatient ?Remains inpatient appropriate because: need for iv abx ?  ?Consultants:  ?ID ?Ct surgery ?cardiology ? ?Procedures:  ?Echo ?IMPRESSIONS  ? ? ? 1. Cannot R/O small oscillating density on TV; suggest TEE to further  ?assess.  ? 2. Left ventricular ejection fraction, by estimation, is 60 to 65%. The  ?left ventricle has normal function. The left ventricle has no regional  ?wall motion abnormalities. Left ventricular diastolic parameters were  ?normal.  ? 3. Right ventricular  systolic function is normal. The right ventricular  ?size is normal. There is normal pulmonary artery systolic pressure.  ? 4. The mitral valve is normal in structure. No evidence of mitral valve  ?regurgitation. No evidence of mitral stenosis.  ? 5. The aortic valve is tricuspid. Aortic valve regurgitation is not  ?visualized. No aortic stenosis is present.  ? 6. The inferior vena cava is normal in size with greater than 50%  ?respiratory variability, suggesting right atrial pressure of 3 mmHg.  ? ?Comparison(s): No prior Echocardiogram.  ? ?TEE ?IMPRESSIONS  ? ? ? 1. Left ventricular ejection fraction, by estimation, is 65 to 70%. The  ?left ventricle has normal function.  ? 2. Right ventricular systolic function is mildly reduced. The right  ?ventricular size is mildly enlarged.  ? 3. Left atrial size was mildly dilated. No left atrial/left atrial  ?appendage thrombus was detected.  ? 4. Right atrial size was moderately dilated.  ? 5. The mitral valve is normal in structure. Trivial mitral valve  ?regurgitation.  ? 6. Large highly mobile 1.8 cm vegetation seems to arise from lateral cusp  ?of TV . The tricuspid valve is abnormal. Tricuspid valve regurgitation is  ?severe.  ? 7. The aortic valve is tricuspid. Aortic valve regurgitation  is not  ?visualized. No aortic stenosis is present.  ? ?Antimicrobials:  ?Anti-infectives (From admission, onward)  ? ? Start     Dose/Rate Route Frequency Ordered Stop  ? 08/29/21 2200  vancomycin (VANCOREADY) IVPB 1750 mg/350 mL       ? 1,750 mg ?175 mL/hr over 120 Minutes Intravenous Every 24 hours 08/29/21 1121 09/16/21 2359  ? 08/21/21 1800  vancomycin (VANCOREADY) IVPB 750 mg/150 mL  Status:  Discontinued       ? 750 mg ?150 mL/hr over 60 Minutes Intravenous Every 12 hours 08/21/21 1344 08/21/21 1347  ? 08/21/21 1800  vancomycin (VANCOCIN) IVPB 1000 mg/200 mL premix  Status:  Discontinued       ? 1,000 mg ?200 mL/hr over 60 Minutes Intravenous Every 12 hours 08/21/21 1347  08/29/21 1121  ? 08/15/21 0600  vancomycin (VANCOCIN) IVPB 1000 mg/200 mL premix  Status:  Discontinued       ? 1,000 mg ?200 mL/hr over 60 Minutes Intravenous Every 12 hours 08/14/21 2006 08/21/21 1344  ? 08/09/21 100

## 2021-09-14 NOTE — Progress Notes (Signed)
Physical Therapy Treatment ?Patient Details ?Name: Latoya [name] ?MRN: [id number] ?DOB: [birth year] ?Today's Date: 09/14/2021 ? ? ?History of Present Illness 36 y.o. female admitted 08/02/21 with back pain, hypotension, hyponatremia, leukocytosis and neutrophilia. Found to have bacteremia possible sepitc pulmonary emboli from endocarditis. PMH: IVDU, heroin abuse, ongoing. ? ?  ?PT Comments  ? ? PT/OT to see given pt reluctance for participation. Pt reluctantly agreeable to work with therapy. Pt supervision for bed mobility. Once EoB with brace set up pt able to don independently. Pt requires modAx2 for sit>stand from EOB. Pt requires maximal multimodal cuing and encouragement for ambulation. Initiated gait with pregait activities and progressed to ambulation with 2 person HHA and modAx2. Pt requires increased UE support for bilateral LE advancement. R foot moves more easily than L. Pt surprised by distance of ambulation tolerated and agreeable to sit up in recliner at end of session. Pt continues to want to go to substance abuse rehab and has found a facility which will take her ambulating with RW. Goals have been reviewed and updated. PT will continue to follow acutely.  ?   ?Recommendations for follow up therapy are one component of a multi-disciplinary discharge planning process, led by the attending physician.  Recommendations may be updated based on patient status, additional functional criteria and insurance authorization. ? ?Follow Up Recommendations ? Outpatient PT ?  ?  ?Assistance Recommended at Discharge Intermittent Supervision/Assistance  ?Patient can return home with the following A little help with walking and/or transfers;A little help with bathing/dressing/bathroom;Assistance with cooking/housework;Direct supervision/assist for medications management;Assist for transportation;Help with stairs or ramp for entrance ?  ?Equipment Recommendations ? Rolling walker (2 wheels);BSC/3in1  ?  ?   ?Precautions /  Restrictions Precautions ?Precautions: Fall ?Precaution Booklet Issued: No ?Precaution Comments: Contact precs, lice, back precs for comfort due to pain ?Required Braces or Orthoses: Spinal Brace ?Spinal Brace: Lumbar corset ?Restrictions ?Weight Bearing Restrictions: No  ?  ? ?Mobility ? Bed Mobility ?Overal bed mobility: Needs Assistance ?Bed Mobility: Supine to Sit ?  ?  ?Supine to sit: Supervision, HOB elevated ?  ?  ?General bed mobility comments: able to come to EoB without assist ?  ? ?Transfers ?Overall transfer level: Needs assistance ?Equipment used: 2 person hand held assist ?Transfers: Sit to/from Stand ?Sit to Stand: Mod assist, +2 physical assistance, Min assist ?  ?  ?  ?  ?  ?General transfer comment: modAx2 for power up from bed, uses hands on knees to power up and bed on back of LE to steady, minA for second sit>stand from bed to transfer to chair ?  ? ?Ambulation/Gait ?Ambulation/Gait assistance: +2 physical assistance, Mod assist ?Gait Distance (Feet): 30 Feet ?Assistive device: 2 person hand held assist ?Gait Pattern/deviations: Step-to pattern, Shuffle, Decreased dorsiflexion - left, Trunk flexed, Narrow base of support ?Gait velocity: decreased ?Gait velocity interpretation: <1.31 ft/sec, indicative of household ambulator ?Pre-gait activities: weight shifts, lateral stepping marching in place ?General Gait Details: pt wants to ambulate without AD, requires modAx2 HHA, increased UE support to advance bilateral LE, increased ability to move R LE, difficulty with lifting L LE, tries to slide or wiggle foot forward instead of lifting due to limited L hip flexion, with maximal encouragement able to walk to door and then to window, rest on bed while recliner set up and then walk to recliner ? ? ? ? ?  ?Balance Overall balance assessment: Needs assistance ?Sitting-balance support: No upper extremity supported, Feet supported ?Sitting balance-Leahy Scale: Good ?  ?  ?  Standing balance support: Single  extremity supported ?Standing balance-Leahy Scale: Poor ?Standing balance comment: Encouraged her to attempt without RW, pt able to static stand with +1 min guard but needing +2 modA for gait without RW ?  ?  ?  ?  ?  ?  ?  ?  ?  ?  ?  ?  ? ?  ?Cognition Arousal/Alertness: Awake/alert ?Behavior During Therapy: [facility] for tasks assessed/performed ?Overall Cognitive Status: Within Functional Limits for tasks assessed ?  ?  ?  ?  ?  ?  ?  ?  ?  ?  ?  ?  ?  ?  ?  ?Problem Solving: Slow processing, Requires verbal cues ?General Comments: reluctant to ambulate, however once she started and was successful more agreeable to progress ambulation ?  ?  ? ?  ?Exercises   ? ?  ?General Comments General comments (skin integrity, edema, etc.): VSS on RA, max noted HR 123bpm with ambulation, pt able to don brace with set up behind her ?  ?  ? ?Pertinent Vitals/Pain Pain Assessment ?Pain Assessment: Faces ?Pain Score: 8  ?Faces Pain Scale: Hurts a little bit ?Pain Location: back, LLE ?Pain Descriptors / Indicators: Sore ?Pain Intervention(s): Limited activity within patient's tolerance, Monitored during session, Repositioned  ? ? ? ?PT Goals (current goals can now be found in the care plan section) Acute Rehab PT Goals ?Patient Stated Goal: To improve, decreased back/sacral pain. ?PT Goal Formulation: With patient ?Time For Goal Achievement: 09/28/21 ?Potential to Achieve Goals: Fair ?Progress towards PT goals: Progressing toward goals ? ?  ?Frequency ? ? ? Min 3X/week ? ? ? ?  ?PT Plan Current plan remains appropriate  ? ? ?Co-evaluation PT/OT/SLP Co-Evaluation/Treatment: Yes ?Reason for Co-Treatment: Necessary to address cognition/behavior during functional activity ?PT goals addressed during session: Mobility/safety with mobility ?  ?  ? ?  ?AM-PAC PT "6 Clicks" Mobility   ?Outcome Measure ? Help needed turning from your back to your side while in a flat bed without using bedrails?: A Little ?Help needed moving from lying on your  back to sitting on the side of a flat bed without using bedrails?: A Little ?Help needed moving to and from a bed to a chair (including a wheelchair)?: A Little ?Help needed standing up from a chair using your arms (e.g., wheelchair or bedside chair)?: A Little ?Help needed to walk in hospital room?: A Little ?Help needed climbing 3-5 steps with a railing? : Total ?6 Click Score: 16 ? ?  ?End of Session Equipment Utilized During Treatment: Back brace ?Activity Tolerance: Patient tolerated treatment well ?Patient left: in chair;with call bell/phone within reach;with chair alarm set ?Nurse Communication: Mobility status ?PT Visit Diagnosis: Other abnormalities of gait and mobility (R26.89);Muscle weakness (generalized) (M62.81);Difficulty in walking, not elsewhere classified (R26.2);Unsteadiness on feet (R26.81);Pain ?Pain - Right/Left: Left ?Pain - part of body: Leg ?  ? ? ?Time: 3382-5053 ?PT Time Calculation (min) (ACUTE ONLY): 30 min ? ?Charges:  $Gait Training: 8-22 mins          ?          ? ?Rodrecus Belsky B. Beverely Risen PT, DPT ?Acute Rehabilitation Services ?Please use secure chat or  ?Call Office [phone] ? ? ? ?[name] ?09/14/2021, 1:04 PM ? ?

## 2021-09-14 NOTE — Progress Notes (Addendum)
Id brief note ? ? ?Tte repeat showed larger vegetation ?Repeat bcx ngtd ?Febrile again yesterday ? ?Cts evaluated. Due to pfo no plan for angiovac ? ? ?A/p ?Hx tv endocarditis mrsa ?Septic pulm emboli ? ?Enlarging veg not uncommon despite abx ?Repeat bcx ngtd ?Suspect still the emboli not septic and not opposed to anticoagulation given its size and right heart strain suggested on imaging ? ?If bcx remains negative would still finish abx course on 5/13 ? ?Close f/u within 2 weeks after abx course finish ? ?Discuss with primary team ? ?--------- ?Addendum ?I have made an appointment for her to see me in [facility]id on 5/23 at 10 am ?

## 2021-09-14 NOTE — Progress Notes (Signed)
Mobility Specialist Progress Note ? ? 09/14/21 1910  ?Mobility  ?Activity Ambulated with assistance in room;Dangled on edge of bed  ?Level of Assistance Minimal assist, patient does 75% or more  ?Assistive Device  ?(HHA +IV pole)  ?Distance Ambulated (ft) 32 ft  ?Activity Response Tolerated well  ?$Mobility charge 1 Mobility  ? ?Received pt in bed having no complaints and agreeable. Focused today on ambulation in room w/o AD. Pt requiring cues on foot placement and full movements of heel-toe strikes. Ambulated w/o fault but requiring HHA from mobility specialist + IV pole. Returned back to EOB w/ lunch tray in front, bed alarm on and call bell in reach.   ? ?Latoya [name] ?Mobility Specialist ?Phone Number [phone] ? ?

## 2021-09-15 ENCOUNTER — Other Ambulatory Visit (HOSPITAL_COMMUNITY): Payer: Self-pay

## 2021-09-15 ENCOUNTER — Inpatient Hospital Stay: Payer: Medicaid Other | Admitting: Internal Medicine

## 2021-09-15 DIAGNOSIS — R7881 Bacteremia: Secondary | ICD-10-CM | POA: Diagnosis not present

## 2021-09-15 DIAGNOSIS — B9562 Methicillin resistant Staphylococcus aureus infection as the cause of diseases classified elsewhere: Secondary | ICD-10-CM | POA: Diagnosis not present

## 2021-09-15 LAB — COMPREHENSIVE METABOLIC PANEL
ALT: 11 U/L (ref 0–44)
AST: 12 U/L — ABNORMAL LOW (ref 15–41)
Albumin: 2.5 g/dL — ABNORMAL LOW (ref 3.5–5.0)
Alkaline Phosphatase: 67 U/L (ref 38–126)
Anion gap: 8 (ref 5–15)
BUN: 10 mg/dL (ref 6–20)
CO2: 21 mmol/L — ABNORMAL LOW (ref 22–32)
Calcium: 8.1 mg/dL — ABNORMAL LOW (ref 8.9–10.3)
Chloride: 103 mmol/L (ref 98–111)
Creatinine, Ser: 0.59 mg/dL (ref 0.44–1.00)
GFR, Estimated: >60 mL/min (ref >60)
Glucose, Bld: 105 mg/dL — ABNORMAL HIGH (ref 70–99)
Potassium: 3.9 mmol/L (ref 3.5–5.1)
Sodium: 132 mmol/L — ABNORMAL LOW (ref 135–145)
Total Bilirubin: 0.8 mg/dL (ref 0.3–1.2)
Total Protein: 6.9 g/dL (ref 6.5–8.1)

## 2021-09-15 LAB — CBC
HCT: 24.9 % — ABNORMAL LOW (ref 36.0–46.0)
Hemoglobin: 7.7 g/dL — ABNORMAL LOW (ref 12.0–15.0)
MCH: 24.1 pg — ABNORMAL LOW (ref 26.0–34.0)
MCHC: 30.9 g/dL (ref 30.0–36.0)
MCV: 78.1 fL — ABNORMAL LOW (ref 80.0–100.0)
Platelets: 197 10*3/uL (ref 150–400)
RBC: 3.19 MIL/uL — ABNORMAL LOW (ref 3.87–5.11)
RDW: 18.7 % — ABNORMAL HIGH (ref 11.5–15.5)
WBC: 11.8 10*3/uL — ABNORMAL HIGH (ref 4.0–10.5)
nRBC: 0 % (ref 0.0–0.2)

## 2021-09-15 LAB — HEPARIN LEVEL (UNFRACTIONATED): Heparin Unfractionated: <0.1 IU/mL — ABNORMAL LOW (ref 0.30–0.70)

## 2021-09-15 MED ORDER — APIXABAN 5 MG PO TABS
10.0000 mg | ORAL_TABLET | Freq: Two times a day (BID) | ORAL | Status: DC
  Administered 2021-09-15 – 2021-09-17 (×5): 10 mg via ORAL
  Filled 2021-09-15 (×5): qty 2

## 2021-09-15 MED ORDER — OXYCODONE HCL 5 MG PO TABS
5.0000 mg | ORAL_TABLET | Freq: Three times a day (TID) | ORAL | 0 refills | Status: DC | PRN
  Filled 2021-09-15: qty 15, 5d supply, fill #0

## 2021-09-15 MED ORDER — BACLOFEN 10 MG PO TABS
10.0000 mg | ORAL_TABLET | Freq: Every day | ORAL | 0 refills | Status: AC | PRN
  Filled 2021-09-15: qty 7, 7d supply, fill #0

## 2021-09-15 MED ORDER — DULOXETINE HCL 40 MG PO CPEP
40.0000 mg | ORAL_CAPSULE | Freq: Every day | ORAL | 0 refills | Status: DC
  Filled 2021-09-15: qty 30, 30d supply, fill #0

## 2021-09-15 MED ORDER — DSS 100 MG PO CAPS
100.0000 mg | ORAL_CAPSULE | Freq: Two times a day (BID) | ORAL | 0 refills | Status: DC | PRN
  Filled 2021-09-15: qty 60, 30d supply, fill #0

## 2021-09-15 MED ORDER — GABAPENTIN 600 MG PO TABS
300.0000 mg | ORAL_TABLET | Freq: Three times a day (TID) | ORAL | 0 refills | Status: DC
  Filled 2021-09-15: qty 45, 30d supply, fill #0

## 2021-09-15 MED ORDER — PANTOPRAZOLE SODIUM 40 MG PO TBEC
40.0000 mg | DELAYED_RELEASE_TABLET | Freq: Every day | ORAL | 0 refills | Status: DC
  Filled 2021-09-15: qty 30, 30d supply, fill #0

## 2021-09-15 MED ORDER — PREGABALIN 75 MG PO CAPS
75.0000 mg | ORAL_CAPSULE | Freq: Three times a day (TID) | ORAL | 0 refills | Status: DC
  Filled 2021-09-15: qty 90, 30d supply, fill #0

## 2021-09-15 MED ORDER — APIXABAN 5 MG PO TABS: 5.0000 mg | ORAL_TABLET | Freq: Two times a day (BID) | ORAL | Status: DC

## 2021-09-15 MED ORDER — HEPARIN BOLUS VIA INFUSION
2000.0000 [IU] | Freq: Once | INTRAVENOUS | Status: AC
  Administered 2021-09-15: 2000 [IU] via INTRAVENOUS
  Filled 2021-09-15: qty 2000

## 2021-09-15 MED ORDER — FERROUS GLUCONATE 324 (38 FE) MG PO TABS
324.0000 mg | ORAL_TABLET | Freq: Every day | ORAL | 0 refills | Status: DC
  Filled 2021-09-15: qty 30, 30d supply, fill #0

## 2021-09-15 MED ORDER — APIXABAN (ELIQUIS) VTE STARTER PACK (10MG AND 5MG)
ORAL_TABLET | ORAL | 0 refills | Status: DC
  Filled 2021-09-15: qty 74, 30d supply, fill #0

## 2021-09-15 NOTE — TOC Progression Note (Addendum)
Transition of Care (TOC) - Progression Note  ?Marvetta Gibbons Therapist, sports, BSN ?Transitions of Care ?Unit 4E- RN Case Manager ?See Treatment Team for direct phone #  ? ? ?Patient Details  ?Name: Latoya [name] ?MRN: [id number] ?Date of Birth: February 02, 1986 ? ?Transition of Care (TOC) CM/SW Contact  ?Dawayne Patricia, RN ?Phone Number: ?09/15/2021, 2:19 PM ? ?Clinical Narrative:    ?Spoke with pt via TC to room, per pt she and her mom are still working on a drug rehab place. She requested a copy of her medication list which I explained she would get a list of her medications as part of her discharge paperwork.  ? ?[facility] pharmacy has scripts and are working on filling for discharge. Pt has been made aware of this and understands that pharmacy will call her regarding any copay cost she would need to take care of. - Meds will be held in main pharmacy and need to be picked up prior to pt leaving ? ?Pt still requesting a RW for home- DME order has been placed. Call made to [employer] for DME need- RW to be delivered prior to d/c over the weekend.  ? ? ?Expected Discharge Plan: Home/Self Care (Pt looking at [facility] drug rehab) ?Barriers to Discharge: Continued Medical Work up (Requiring IV antibiotics through 09/16/2021, Hx IVDU) ? ?Expected Discharge Plan and Services ?Expected Discharge Plan: Home/Self Care (Pt looking at [place] drug rehab) ?In-house Referral: Clinical Social Work, PCP / Psychologist, educational ?Discharge Planning Services: CM Consult, Follow-up appt scheduled ?Post Acute Care Choice: Durable Medical Equipment ?Living arrangements for the past 2 months: No permanent address ?                ?DME Arranged: [place] ?DME Agency: [employer] ?Date DME Agency Contacted: 09/15/21 ?Time DME Agency Contacted: [id number] ?Representative spoke with at DME Agency: Jodell Cipro ?HH Arranged: NA ?[place] Agency: NA ?  ?  ?  ? ? ?Social Determinants of Health (SDOH) Interventions ?  ? ?Readmission Risk Interventions ? ?  08/17/2021  ? 11:31 AM  ?Readmission  Risk Prevention Plan  ?Transportation Screening Complete  ?PCP or Specialist Appt within 5-7 Days Complete  ?Home Care Screening Complete  ?Medication Review (RN CM) Complete  ? ? ?

## 2021-09-15 NOTE — Progress Notes (Signed)
ANTICOAGULATION CONSULT NOTE  ? ?Pharmacy Consult for Heparin >>apixaban ?Indication: New onset pulmonary embolus vs septic emboli with right heart strain ? ?Allergies  ?Allergen Reactions  ? Naltrexone Anxiety  ?  Severe anxiety, restlessness, vomiting  ? ? ?Patient Measurements: ?Height: 5\' 6"  (167.6 cm) ?Weight: 87.5 kg (192 lb 14.4 oz) ?IBW/kg (Calculated) : 59.3 ? ? ?Vital Signs: ?Temp: 98.4 ?F (36.9 ?C) (05/12 0430) ?Temp Source: Oral (05/12 0430) ?BP: 109/77 (05/12 0500) ?Pulse Rate: 100 (05/12 0430) ? ?Labs: ?Recent Labs  ?  09/12/21 ?0920 09/13/21 ?0155 09/13/21 ?1020 09/13/21 ?1916 09/14/21 ?0330 09/14/21 ?11/14/21 09/14/21 ?1148 09/15/21 ?11/15/21  ?HGB 9.2* 8.7*  --   --   --  8.3*  --  7.7*  ?HCT 28.8* 27.5*  --   --   --  27.4*  --  24.9*  ?PLT 204 191  --   --   --  208  --  197  ?APTT  --   --    < > 43* 44*  --  59*  --   ?HEPARINUNFRC  --   --    < >  --  <0.10* <0.10* 0.10* <0.10*  ?CREATININE 0.63 0.64  --   --   --   --   --  0.59  ? < > = values in this interval not displayed.  ? ? ?Estimated Creatinine Clearance: 110.4 mL/min (by C-G formula based on SCr of 0.59 mg/dL). ? ?Medical History: ?Past Medical History:  ?Diagnosis Date  ? Heroin addiction (HCC)   ? IV drug abuse (HCC)   ? ? ?Assessment: ?36 y/o F with prolonged stay for bacteremia/endocarditis. CT angio with likely new onset PE. Cannot exclude septic emboli; however, there does appear to be right heart strain. Heparin started. Pt has been on DVT prophylaxis dose of Xarelto (last dose 5/9 @ 1800) as indicated by low level anti-xa level. aPTT below therapeutic range so levels are not correlated. Will rely on aptt for monitoring for now. No bleeding or IV issues noted.  ? ?Heparin level undetectable this morning, rate adjusted. New orders received to transition patient over to apixaban.  ? ?Copay check = $0 ? ?Goal of Therapy:  ?Heparin level 0.3-0.7 units/ml ?aPTT 66-102 seconds ?Monitor platelets by anticoagulation protocol: Yes ?  ?Plan:   ?Apixaban 10mg  bid x7 days then 5mg  bid ? ?7/9 PharmD., BCPS ?Clinical Pharmacist ?09/15/2021 8:13 AM ? ?

## 2021-09-15 NOTE — TOC Benefit Eligibility Note (Signed)
Patient Advocate Encounter ?  ?Received notification that prior authorization for DULoxetine HCl 40MG  dr capsules is required. ?  ?PA submitted on 09/15/2021 ?Key [id number] ?Status is pending ?   ? ? ? ?11/15/2021, CPhT ?Pharmacy Patient Advocate Specialist ?[facility] Pharmacy Patient Advocate Team ?Direct Number: [phone]  Fax: [phone]  ?

## 2021-09-15 NOTE — Progress Notes (Signed)
Pt has RW in room and medications in main pharmacy for when she discharges. Pt aware. Thomas Hoff, RN ? ?

## 2021-09-15 NOTE — TOC Benefit Eligibility Note (Signed)
[facility] Patient Advocate Encounter ? ?Received notification from that the request for prior authorization for DULoxetine HCl 40MG  dr capsules has been denied due to  we do not show that you have tried at least 2 preferred drugs. Other covered drug(s) is/are:  gabapentin capsule / solution / tablet (generic for Neurontin), pregabalin capsule /solution (generic for Lyrica Capsule / Solution), lidocaine patch (generic for Lidoderm) - .   ?  ? ? ? , CPhT ?Pharmacy Patient Advocate Specialist ?[facility] Pharmacy Patient Advocate Team ?Direct Number: [phone]  Fax: [phone]  ?

## 2021-09-15 NOTE — Progress Notes (Signed)
?PROGRESS NOTE ? ? ? ?Latoya [name]  HRC:[id number] DOB: April 14, 1986 DOA: 08/02/2021 ?PCP: Pcp, No  ? ? ?Brief Narrative:  ? ?Latoya [name] is a 36 year old female with past medical history significant for MSSA tricuspid valve endocarditis 2020, MRSA discitis/hospitalized 2021 with ongoing IV drug abuse who presented to [place] ED on 3/29 with low back pain, cough and fever.  MR lumbar spine negative for infection but blood cultures positive for MRSA bacteremia with tricuspid valve vegetation noted on echocardiogram with associated septic pulmonary emboli.  Patient was seen by CT surgery and underwent angio Vac procedure but discovery of a significant PFO required with the abortion of this procedure.  Hospital course complicated by recurrent fever and chest pain; CT angiogram chest on 5/9 notable for bilateral pulmonary emboli, most pronounced left involving the left pulmonary artery, lobar, segmental and subsegmental arteries, right ventricle distended suggesting right heart strain.  Repeat TTE 5/10 with EF 60 to 65%, a large highly mobile tricuspid valve vegetation measuring 2.1 cm x 1.2 cm, severe tricuspid regurgitation with suspicion of leaflet perforation with interval increase in size with RV systolic function preserved. Infectious disease was consulted and has been following during the hospital course with plan to continue antibiotics with vancomycin until 09/16/2021. ? ?Assessment & Plan: ?  ?MRSA bacteremia ?Tricuspid valve endocarditis ?Septic pulmonary embolism ?Patient presenting to the ED with back pain, fevers in the setting of continued IV drug abuse.  Blood cultures positive for MRSA.  Echocardiogram with tricuspid valve vegetation.  Was evaluated by CT surgery and underwent angio Vac procedure but given discovery of a significant PFO, procedure was aborted.  Infectious disease was consulted and followed during hospital course.  Repeat blood cultures 08/06/2021 with Staphylococcus epidermidis, likely  contaminant. ?--Blood cultures 5/9: No growth x 3 days ?--Continue vancomycin, pharmacy consulted for dosing/monitoring ?--Per ID, to complete IV antibiotic course on evening of 5/13 ?--Remains afebrile ? ?Acute pulmonary embolism ?Patient developed chest pain, fever and chills on 09/12/2021.  Chest x-ray unrevealing.  CT angiogram chest notable for bilateral pulmonary emboli, most pronounced left involving the left pulmonary artery, lobar, segmental and subsegmental arteries, right ventricle distended suggesting right heart strain.  Repeat TTE 5/10 with EF 60 to 65%, a large highly mobile tricuspid valve vegetation measuring 2.1 cm x 1.2 cm, severe tricuspid regurgitation with suspicion of leaflet perforation with interval increase in size with RV systolic function preserved. ?--Transition IV heparin drip to Eliquis today; with anticipation discharge home on Sunday ? ?IV drug abuse ?Opioid use disorder ?Counseled on need for complete cessation given her recurrent tricuspid valve endocarditis now with MRSA.  Patient was given substance abuse resources by [facility]. ? ?Chronic pain syndrome ?Chronic lumbar pain ?MR T and L-spine unrevealing. ?--Cymbalta ?--Lyrica ?--Baclofen taper ?--Oxycodone as needed ? ?Iron deficiency anemia ?Iron panel with serum iron 22, transferrin saturation 7, ferritin 51, TIBC 312. ?--Ferrous gluconate 324 mg p.o. daily ? ?Ambulatory dysfunction/physical deconditioning: ?--Continue PT/OT efforts while inpatient, recommend rolling walker on discharge and outpatient physical therapy. ? ?Hx of lice, not POA ?Etiology likely brought in from outside visitors.  Patient completed permethrin on 08/31/2021.  Unfortunately patient's belongings were placed in the plastic bags but were not treated at that time, patient instructed that she is not open these bags while in the hospital as they are considered contaminated. ? ?Hyponatremia ?Treated with IV fluid hydration, resolved. ? ? ?DVT prophylaxis: Place and  maintain sequential compression device Start: 08/07/21 1350 ? ?  Code Status: Full Code ?Family  Communication:  ? ?Disposition Plan:  ?Level of care: Med-Surg ?Status is: Inpatient ?Remains inpatient appropriate because: Continues on IV antibiotics with completion date evening of 5/13, anticipate discharge home on 5/14 ?  ? ?Consultants:  ?Infectious disease ?CTS, Dr. Cliffton AstersLightfoot ?Cardiology for TEE ? ?Procedures:  ?TTE ?TEE ? ?Antimicrobials:  ?Vancomycin ? ? ?Subjective: ?Patient seen examined bedside, resting comfortably.  No specific complaints this morning.  Discussed with patient that she will complete her antibiotic course the evening of 5/13.  Anticipate discharge home on Sunday.  Discussed will transition IV heparin to Eliquis today for anticipated discharge this weekend.  [facility]  No other questions or concerns at this time.  Denies headache, no chest pain, no shortness of breath, no current fever/chills/night sweats, no nausea/vomiting/diarrhea, no abdominal pain, no fatigue, no paresthesias.  No acute events overnight per nursing staff. ? ?Objective: ?Vitals:  ? 09/15/21 0026 09/15/21 0430 09/15/21 0500 09/15/21 0830  ?BP: 97/61 98/77 109/77 104/67  ?Pulse: (!) 108 100  94  ?Resp: 18 18  20   ?Temp: 98.4 ?F (36.9 ?C) 98.4 ?F (36.9 ?C)  98.3 ?F (36.8 ?C)  ?TempSrc: Oral Oral  Oral  ?SpO2: 98% 100%  100%  ?Weight:      ?Height:      ? ? ?Intake/Output Summary (Last 24 hours) at 09/15/2021 0953 ?Last data filed at 09/15/2021 0830 ?Gross per 24 hour  ?Intake 0 ml  ?Output 0 ml  ?Net 0 ml  ? ?Filed Weights  ? 08/03/21 1036 08/08/21 1111 08/09/21 1607  ?Weight: 82.2 kg 82.2 kg 87.5 kg  ? ? ?Examination: ? ?Physical Exam: ?GEN: NAD, alert and oriented x 3, chronically ill in appearance, appears older than stated age ?HEENT: NCAT, PERRL, EOMI, sclera clear, MMM ?PULM: CTAB w/o wheezes/crackles, normal respiratory effort, on room air ?CV: RRR w/o M/G/R ?GI: abd soft, NTND, NABS, no R/G/M ?MSK:  no peripheral edema, muscle strength globally intact 5/5 bilateral upper/lower extremities ?NEURO: CN II-XII intact, no focal deficits, sensation to light touch intact ?PSYCH: normal mood/affect ?Integumentary: Scabs noted to lower extremities, otherwise skin with no concerning lesions/rashes/wounds  ? ? ? ?Data Reviewed: I have personally reviewed following labs and imaging studies ? ?CBC: ?Recent Labs  ?Lab 09/12/21 ?0920 09/13/21 ?0155 09/14/21 ?[id number] 09/15/21 ?[id number]  ?WBC 16.0* 14.9* 14.0* 11.8*  ?NEUTROABS 11.5* 11.0*  --   --   ?HGB 9.2* 8.7* 8.3* 7.7*  ?HCT 28.8* 27.5* 27.4* 24.9*  ?MCV 75.6* 77.0* 78.1* 78.1*  ?PLT 204 191 208 197  ? ?Basic Metabolic Panel: ?Recent Labs  ?Lab 09/12/21 ?0920 09/13/21 ?0155 09/15/21 ?[id number]  ?NA 136 133* 132*  ?K 4.0 3.6 3.9  ?CL 106 102 103  ?CO2 24 25 21*  ?GLUCOSE 106* 152* 105*  ?BUN 9 10 10   ?CREATININE 0.63 0.64 0.59  ?CALCIUM 8.8* 8.3* 8.1*  ?MG  --  1.8  --   ?PHOS  --  3.5  --   ? ?GFR: ?Estimated Creatinine Clearance: 110.4 mL/min (by C-G formula based on SCr of 0.59 mg/dL). ?Liver Function Tests: ?Recent Labs  ?Lab 09/12/21 ?0920 09/13/21 ?0155 09/15/21 ?[id number]  ?AST 8* 13* 12*  ?ALT 11 11 11   ?ALKPHOS 67 71 67  ?BILITOT 1.0 1.1 0.8  ?PROT 7.4 7.2 6.9  ?ALBUMIN 2.8* 2.7* 2.5*  ? ?No results for input(s): LIPASE, AMYLASE in the last 168 hours. ?No results for input(s): AMMONIA in the last 168 hours. ?Coagulation Profile: ?No results for input(s): INR, PROTIME in the  last 168 hours. ?Cardiac Enzymes: ?No results for input(s): CKTOTAL, CKMB, CKMBINDEX, TROPONINI in the last 168 hours. ?BNP (last 3 results) ?No results for input(s): PROBNP in the last 8760 hours. ?HbA1C: ?No results for input(s): HGBA1C in the last 72 hours. ?CBG: ?No results for input(s): GLUCAP in the last 168 hours. ?Lipid Profile: ?No results for input(s): CHOL, HDL, LDLCALC, TRIG, CHOLHDL, LDLDIRECT in the last 72 hours. ?Thyroid Function Tests: ?No results for input(s): TSH, T4TOTAL, FREET4, T3FREE,  THYROIDAB in the last 72 hours. ?Anemia Panel: ?No results for input(s): VITAMINB12, FOLATE, FERRITIN, TIBC, IRON, RETICCTPCT in the last 72 hours. ?Sepsis Labs: ?Recent Labs  ?Lab 09/13/21 ?0155 09/14/21 ?05

## 2021-09-15 NOTE — Progress Notes (Signed)
Mobility Specialist Progress Note ? ? 09/15/21 1530  ?Mobility  ?Activity Ambulated with assistance in room  ?Level of Assistance Minimal assist, patient does 75% or more  ?Assistive Device  ?(HHA)  ?Distance Ambulated (ft) 32 ft  ?Activity Response Tolerated well  ?$Mobility charge 1 Mobility  ? ?Received in bed having c/o slight LE and hip pain but agreeable. Pt needing incr time for STS but requiring no assistance. Initially, while ambulating pt requiring HHA and IV pole but as ambulation progressed pt no long requiring IV pole or HHA. Arrived at the chair w/o fault and call bell in reach. Pt eager for d/c.   ? ?Frederico Hamman ?Mobility Specialist ?Phone Number [phone] ? ?

## 2021-09-15 NOTE — Progress Notes (Addendum)
ANTICOAGULATION CONSULT NOTE  ? ?Pharmacy Consult for Heparin  ?Indication: New onset pulmonary embolus vs septic emboli with right heart strain ? ?Allergies  ?Allergen Reactions  ? Naltrexone Anxiety  ?  Severe anxiety, restlessness, vomiting  ? ? ?Patient Measurements: ?Height: 5\' 6"  (167.6 cm) ?Weight: 87.5 kg (192 lb 14.4 oz) ?IBW/kg (Calculated) : 59.3 ? ? ?Vital Signs: ?Temp: 98.4 ?F (36.9 ?C) (05/12 0026) ?Temp Source: Oral (05/12 0026) ?BP: 97/61 (05/12 0026) ?Pulse Rate: 108 (05/12 0026) ? ?Labs: ?Recent Labs  ?  09/12/21 ?0920 09/13/21 ?0155 09/13/21 ?1020 09/13/21 ?1916 09/14/21 ?0330 09/14/21 ?[id number] 09/14/21 ?1148 09/15/21 ?[id number]  ?HGB 9.2* 8.7*  --   --   --  8.3*  --  7.7*  ?HCT 28.8* 27.5*  --   --   --  27.4*  --  24.9*  ?PLT 204 191  --   --   --  208  --  197  ?APTT  --   --    < > 43* 44*  --  59*  --   ?HEPARINUNFRC  --   --    < >  --  <0.10* <0.10* 0.10* <0.10*  ?CREATININE 0.63 0.64  --   --   --   --   --   --   ? < > = values in this interval not displayed.  ? ? ?Estimated Creatinine Clearance: 110.4 mL/min (by C-G formula based on SCr of 0.64 mg/dL). ? ?Medical History: ?Past Medical History:  ?Diagnosis Date  ? Heroin addiction ([place])   ? IV drug abuse ([place])   ? ? ?Assessment: ?36 y/o F with prolonged stay for bacteremia/endocarditis. CT angio with likely new onset PE. Cannot exclude septic emboli; however, there does appear to be right heart strain. Heparin started. Pt has been on DVT prophylaxis dose of Xarelto (last dose 5/9 @ 1800) as indicated by low level anti-xa level. aPTT below therapeutic range so levels are not correlated. Will rely on aptt for monitoring for now. No bleeding or IV issues noted.  ? ?Heparin level <0.10  (subtherapeutic) on gtt at 2150 units/hr. No issues with infusion or bleeding reported per RN. Noted down trend in Hgb, but no overt bleeding. Phlebotomy having issues collecting sample, therefore timing of this level is a little late. ? ?Goal of Therapy:  ?Heparin  level 0.3-0.7 units/ml ?Monitor platelets by anticoagulation protocol: Yes ?  ?Plan:  ?Bolus IV heparin 2000 units  ?Increase heparin drip to 2400 units/hr ?Recheck heparin level in 6 hours ? ?Georga Bora, PharmD ?Clinical Pharmacist ?09/15/2021 1:29 AM ?Please check AMION for all [place] numbers\ ?

## 2021-09-15 NOTE — TOC Benefit Eligibility Note (Signed)
Patient Advocate Encounter ? ?Insurance verification completed.   ? ?The patient is currently admitted and upon discharge could be taking Eliquis 5 mg. ? ?The current 30 day co-pay is, $0.00.  ? ?The patient is insured through [employer] [place] [employer]  ? ? ? ?Lyndel Safe, CPhT ?Pharmacy Patient Advocate Specialist ?[facility] Patient Advocate Team ?Direct Number: [phone]  Fax: [phone] ? ? ? ? ? ?  ?

## 2021-09-15 NOTE — Progress Notes (Signed)
Pt initially refused lice treatment, lidocaine patch and creams. Upset that NT was in room to help with shower and cleaning room from piles of bagged items. Pt requested director see her. Pt given lice treatment and creams after speaking with director.  ?Pt restarted on IV pain meds and wanting PRN when it is available between scheduled oxycodone doses. Sent message to MD as patient is not motivated to improve mobility and sleeps mostly. ?BP runs low and explained to pt that we would not be given morphine if BP below 90. Pt upset but rechecked after ambulation to sink for shower and medication given.  ?Payton Emerald, RN ? ?

## 2021-09-15 NOTE — Progress Notes (Signed)
PT Cancellation Note ? ?Patient Details ?Name: Latoya [name] ?MRN: [id number] ?DOB: 15-Dec-1985 ? ? ?Cancelled Treatment:    Reason Eval/Treat Not Completed: Patient declined, no reason specified ? ? ?Josefina Rynders F Pericles Carmicheal ?09/15/2021, 11:20 AM ?Alvis Lemmings M,PT ?Acute Rehab Services ?[phone] ?[phone] (pager)  ?

## 2021-09-15 NOTE — Discharge Instructions (Signed)
Information on my medicine - ELIQUIS? (apixaban) ? ? ?Why was Eliquis? prescribed for you? ?Eliquis? was prescribed to treat blood clots that may have been found in the veins of your legs (deep vein thrombosis) or in your lungs (pulmonary embolism) and to reduce the risk of them occurring again. ? ?What do You need to know about Eliquis? ? ?The starting dose is 10 mg (two 5 mg tablets) taken TWICE daily for the FIRST SEVEN (7) DAYS, then on 09/22/21  the dose is reduced to ONE 5 mg tablet taken TWICE daily.  Eliquis? may be taken with or without food.  ? ?Try to take the dose about the same time in the morning and in the evening. If you have difficulty swallowing the tablet whole please discuss with your pharmacist how to take the medication safely. ? ?Take Eliquis? exactly as prescribed and DO NOT stop taking Eliquis? without talking to the doctor who prescribed the medication.  Stopping may increase your risk of developing a new blood clot.  Refill your prescription before you run out. ? ?After discharge, you should have regular check-up appointments with your healthcare provider that is prescribing your Eliquis?. ?   ?What do you do if you miss a dose? ?If a dose of ELIQUIS? is not taken at the scheduled time, take it as soon as possible on the same day and twice-daily administration should be resumed. The dose should not be doubled to make up for a missed dose. ? ?Important Safety Information ?A possible side effect of Eliquis? is bleeding. You should call your healthcare provider right away if you experience any of the following: ?Bleeding from an injury or your nose that does not stop. ?Unusual colored urine (red or dark brown) or unusual colored stools (red or black). ?Unusual bruising for unknown reasons. ?A serious fall or if you hit your head (even if there is no bleeding). ? ?Some medicines may interact with Eliquis? and might increase your risk of bleeding or clotting while on Eliquis?Marland Kitchen To help avoid  this, consult your healthcare provider or pharmacist prior to using any new prescription or non-prescription medications, including herbals, vitamins, non-steroidal anti-inflammatory drugs (NSAIDs) and supplements. ? ?This website has more information on Eliquis? (apixaban): http://www.eliquis.com/eliquis/home ? ?

## 2021-09-16 DIAGNOSIS — B9562 Methicillin resistant Staphylococcus aureus infection as the cause of diseases classified elsewhere: Secondary | ICD-10-CM | POA: Diagnosis not present

## 2021-09-16 DIAGNOSIS — R7881 Bacteremia: Secondary | ICD-10-CM | POA: Diagnosis not present

## 2021-09-16 NOTE — Progress Notes (Signed)
?PROGRESS NOTE ? ? ? ?Latoya [name]  [id number] DOB: 29-Aug-1985 DOA: 08/02/2021 ?PCP: Pcp, No  ? ? ?Brief Narrative:  ? ?Latoya [name] is a 36 year old female with past medical history significant for MSSA tricuspid valve endocarditis 2020, MRSA discitis/hospitalized 2021 with ongoing IV drug abuse who presented to [facility] ED on 3/29 with low back pain, cough and fever.  MR lumbar spine negative for infection but blood cultures positive for MRSA bacteremia with tricuspid valve vegetation noted on echocardiogram with associated septic pulmonary emboli.  Patient was seen by CT surgery and underwent angio Vac procedure but discovery of a significant PFO required with the abortion of this procedure.  Hospital course complicated by recurrent fever and chest pain; CT angiogram chest on 5/9 notable for bilateral pulmonary emboli, most pronounced left involving the left pulmonary artery, lobar, segmental and subsegmental arteries, right ventricle distended suggesting right heart strain.  Repeat TTE 5/10 with EF 60 to 65%, a large highly mobile tricuspid valve vegetation measuring 2.1 cm x 1.2 cm, severe tricuspid regurgitation with suspicion of leaflet perforation with interval increase in size with RV systolic function preserved. Infectious disease was consulted and has been following during the hospital course with plan to continue antibiotics with vancomycin until 09/16/2021. ? ?Assessment & Plan: ?  ?MRSA bacteremia ?Tricuspid valve endocarditis ?Septic pulmonary embolism ?Patient presenting to the ED with back pain, fevers in the setting of continued IV drug abuse.  Blood cultures positive for MRSA.  Echocardiogram with tricuspid valve vegetation.  Was evaluated by CT surgery and underwent angio Vac procedure but given discovery of a significant PFO, procedure was aborted.  Infectious disease was consulted and followed during hospital course.  Repeat blood cultures 08/06/2021 with Staphylococcus epidermidis, likely  contaminant. ?--Blood cultures 5/9: No growth x 3 days ?--Continue vancomycin, pharmacy consulted for dosing/monitoring ?--Per ID, to complete IV antibiotic course on evening of 5/13 ?--Remains afebrile ? ?Acute pulmonary embolism ?Patient developed chest pain, fever and chills on 09/12/2021.  Chest x-ray unrevealing.  CT angiogram chest notable for bilateral pulmonary emboli, most pronounced left involving the left pulmonary artery, lobar, segmental and subsegmental arteries, right ventricle distended suggesting right heart strain.  Repeat TTE 5/10 with EF 60 to 65%, a large highly mobile tricuspid valve vegetation measuring 2.1 cm x 1.2 cm, severe tricuspid regurgitation with suspicion of leaflet perforation with interval increase in size with RV systolic function preserved. ?--IV heparin transition to Eliquis on 5/12 ? ?IV drug abuse ?Opioid use disorder ?Counseled on need for complete cessation given her recurrent tricuspid valve endocarditis now with MRSA.  Patient was given substance abuse resources by [facility]. ? ?Chronic pain syndrome ?Chronic lumbar pain ?MR T and L-spine unrevealing. ?--Cymbalta ?--Lyrica ?--Baclofen taper ?--Oxycodone as needed ? ?Iron deficiency anemia ?Iron panel with serum iron 22, transferrin saturation 7, ferritin 51, TIBC 312. ?--Ferrous gluconate 324 mg p.o. daily ? ?Ambulatory dysfunction/physical deconditioning: ?--Continue PT/OT efforts while inpatient, recommend rolling walker on discharge and outpatient physical therapy. ? ?Hx of lice, not POA ?Etiology likely brought in from outside visitors.  Patient completed permethrin on 08/31/2021.  Unfortunately patient's belongings were placed in the plastic bags but were not treated at that time, patient instructed that she is not open these bags while in the hospital as they are considered contaminated. ? ?Hyponatremia ?Treated with IV fluid hydration, resolved. ? ? ?DVT prophylaxis: Place and maintain sequential compression device Start:  08/07/21 1350 ?apixaban (ELIQUIS) tablet 10 mg  ?apixaban (ELIQUIS) tablet 5 mg  ?  Code Status: Full Code ?Family Communication:  ? ?Disposition Plan:  ?Level of care: Med-Surg ?Status is: Inpatient ?Remains inpatient appropriate because: Continues on IV antibiotics with completion date evening of 5/13, anticipate discharge home on 5/14 ?  ? ?Consultants:  ?Infectious disease ?CTS, Dr. Kipp Brood ?Cardiology for TEE ? ?Procedures:  ?TTE ?TEE ? ?Antimicrobials:  ?Vancomycin ? ? ?Subjective: ?Patient seen examined bedside, resting comfortably.  Asking about if her heart will ever improve.  Discussed with patient that she will need to ensure no further recurrences of IV drug abuse otherwise she will continue to climb.  She will see ID and follow-up for further discussion regarding endocarditis.  Will finish antibiotics this evening with anticipated discharge home tomorrow.  No other questions or concerns at this time.  Denies headache, no chest pain, no shortness of breath, no current fever/chills/night sweats, no nausea/vomiting/diarrhea, no abdominal pain, no fatigue, no paresthesias.  No acute events overnight per nursing staff. ? ?Objective: ?Vitals:  ? 09/15/21 2322 09/16/21 0454 09/16/21 0809 09/16/21 1113  ?BP: 114/70 108/75 115/83 117/84  ?Pulse: (!) 103 (!) 106 (!) 108 (!) 103  ?Resp: 20 (!) 22 (!) 22 (!) 33  ?Temp: 98 ?F (36.7 ?C) 99.2 ?F (37.3 ?C) 98.5 ?F (36.9 ?C) 99.8 ?F (37.7 ?C)  ?TempSrc: Oral Oral Oral Oral  ?SpO2: 98% 100% 98%   ?Weight:      ?Height:      ? ? ?Intake/Output Summary (Last 24 hours) at 09/16/2021 1131 ?Last data filed at 09/15/2021 1628 ?Gross per 24 hour  ?Intake 240 ml  ?Output --  ?Net 240 ml  ? ?Filed Weights  ? 08/03/21 1036 08/08/21 1111 08/09/21 1607  ?Weight: 82.2 kg 82.2 kg 87.5 kg  ? ? ?Examination: ? ?Physical Exam: ?GEN: NAD, alert and oriented x 3, chronically ill in appearance, appears older than stated age ?HEENT: NCAT, PERRL, EOMI, sclera clear, MMM ?PULM: CTAB w/o  wheezes/crackles, normal respiratory effort, on room air ?CV: RRR w/o M/G/R ?GI: abd soft, NTND, NABS, no R/G/M ?MSK: no peripheral edema, muscle strength globally intact 5/5 bilateral upper/lower extremities ?NEURO: CN II-XII intact, no focal deficits, sensation to light touch intact ?PSYCH: normal mood/affect ?Integumentary: Scabs noted to lower extremities, otherwise skin with no concerning lesions/rashes/wounds  ? ? ? ?Data Reviewed: I have personally reviewed following labs and imaging studies ? ?CBC: ?Recent Labs  ?Lab 09/12/21 ?0920 09/13/21 ?0155 09/14/21 ?[id number] 09/15/21 ?[id number]  ?WBC 16.0* 14.9* 14.0* 11.8*  ?NEUTROABS 11.5* 11.0*  --   --   ?HGB 9.2* 8.7* 8.3* 7.7*  ?HCT 28.8* 27.5* 27.4* 24.9*  ?MCV 75.6* 77.0* 78.1* 78.1*  ?PLT 204 191 208 197  ? ?Basic Metabolic Panel: ?Recent Labs  ?Lab 09/12/21 ?0920 09/13/21 ?0155 09/15/21 ?[id number]  ?NA 136 133* 132*  ?K 4.0 3.6 3.9  ?CL 106 102 103  ?CO2 24 25 21*  ?GLUCOSE 106* 152* 105*  ?BUN 9 10 10   ?CREATININE 0.63 0.64 0.59  ?CALCIUM 8.8* 8.3* 8.1*  ?MG  --  1.8  --   ?PHOS  --  3.5  --   ? ?GFR: ?Estimated Creatinine Clearance: 110.4 mL/min (by C-G formula based on SCr of 0.59 mg/dL). ?Liver Function Tests: ?Recent Labs  ?Lab 09/12/21 ?0920 09/13/21 ?0155 09/15/21 ?[id number]  ?AST 8* 13* 12*  ?ALT 11 11 11   ?ALKPHOS 67 71 67  ?BILITOT 1.0 1.1 0.8  ?PROT 7.4 7.2 6.9  ?ALBUMIN 2.8* 2.7* 2.5*  ? ?No results for input(s): LIPASE, AMYLASE in the last 168  hours. ?No results for input(s): AMMONIA in the last 168 hours. ?Coagulation Profile: ?No results for input(s): INR, PROTIME in the last 168 hours. ?Cardiac Enzymes: ?No results for input(s): CKTOTAL, CKMB, CKMBINDEX, TROPONINI in the last 168 hours. ?BNP (last 3 results) ?No results for input(s): PROBNP in the last 8760 hours. ?HbA1C: ?No results for input(s): HGBA1C in the last 72 hours. ?CBG: ?No results for input(s): GLUCAP in the last 168 hours. ?Lipid Profile: ?No results for input(s): CHOL, HDL, LDLCALC, TRIG,  CHOLHDL, LDLDIRECT in the last 72 hours. ?Thyroid Function Tests: ?No results for input(s): TSH, T4TOTAL, FREET4, T3FREE, THYROIDAB in the last 72 hours. ?Anemia Panel: ?No results for input(s): VITAMINB12, FOLATE, FERRI

## 2021-09-17 DIAGNOSIS — R7881 Bacteremia: Secondary | ICD-10-CM | POA: Diagnosis not present

## 2021-09-17 DIAGNOSIS — B9562 Methicillin resistant Staphylococcus aureus infection as the cause of diseases classified elsewhere: Secondary | ICD-10-CM | POA: Diagnosis not present

## 2021-09-17 LAB — CULTURE, BLOOD (ROUTINE X 2)
Culture: NO GROWTH
Culture: NO GROWTH

## 2021-09-17 NOTE — Discharge Summary (Signed)
?Physician Discharge Summary  ?Latoya DecampHeather Sthilaire JWJ:[id number] DOB: Sep 15, 1985 DOA: 08/02/2021 ? ?PCP: Latoya [name] ? ?Admit date: 08/02/2021 ?Discharge date: 09/17/2021 ? ?Admitted From: Home ?Disposition: Home ? ?Recommendations for Outpatient Follow-up:  ?Follow up with PCP in 1-2 weeks ?Follow-up with infectious disease, Latoya [name] as scheduled on 09/26/2021 at 10 AM ?Continue to encourage cessation from illicit substances, IV drug abuse ? ?Home Health: [name] ?Equipment/Devices: Rolling walker ? ?Discharge Condition: Stable ?CODE STATUS: Full code ?Diet recommendation: Regular diet ? ?History of present illness: ? ?Latoya DecampHeather [name] is a 36 year old female with past medical history significant for MSSA tricuspid valve endocarditis 2020, MRSA discitis/hospitalized 2021 with ongoing IV drug abuse who [facility] with low back pain, cough and fever.  MR lumbar spine negative for infection but blood cultures positive for MRSA bacteremia with tricuspid valve vegetation noted on echocardiogram with associated septic pulmonary emboli.  Patient was seen by CT surgery and underwent angio Vac procedure but discovery of a significant PFO required with the abortion of this procedure.  Hospital course complicated by recurrent fever and chest pain; CT angiogram chest on 5/9 notable for bilateral pulmonary emboli, most pronounced left involving the left pulmonary artery, lobar, segmental and subsegmental arteries, right ventricle distended suggesting right heart strain.  Repeat TTE 5/10 with EF 60 to 65%, a large highly mobile tricuspid valve vegetation measuring 2.1 cm x 1.2 cm, severe tricuspid regurgitation with suspicion of leaflet perforation with interval increase in size with RV systolic function preserved. Infectious disease was consulted and has been following during the hospital course with plan to continue antibiotics with vancomycin until 09/16/2021. ? ? ?Hospital course: ? ?MRSA bacteremia ?Tricuspid valve  endocarditis ?Septic pulmonary embolism ?Patient presenting to the ED with back pain, fevers in the setting of continued IV drug abuse.  Blood cultures positive for MRSA.  Echocardiogram with tricuspid valve vegetation.  Was evaluated by CT surgery and underwent angio Vac procedure but given discovery of a significant PFO, procedure was aborted. Infectious disease was consulted and followed during hospital course.  Repeat blood cultures 08/06/2021 with Staphylococcus epidermidis, likely contaminant.  Repeat blood cultures 09/12/2021 with [name] growth x5 days.  Patient completed 6-week course of IV vancomycin.  Outpatient follow-up with infectious disease as scheduled on 09/26/2021 with Latoya [name] at 10 AM. ?  ?Acute pulmonary embolism ?Patient developed chest pain, fever and chills on 09/12/2021.  Chest x-ray unrevealing.  CT angiogram chest notable for bilateral pulmonary emboli, most pronounced left involving the left pulmonary artery, lobar, segmental and subsegmental arteries, right ventricle distended suggesting right heart strain.  Repeat TTE 5/10 with EF 60 to 65%, a large highly mobile tricuspid valve vegetation measuring 2.1 cm x 1.2 cm, severe tricuspid regurgitation with suspicion of leaflet perforation with interval increase in size with RV systolic function preserved. IV heparin transitioned to Eliquis on 5/12.  Outpatient follow-up with PCP. ?  ?IV drug abuse ?Opioid use disorder ?Counseled on need for complete cessation given her recurrent tricuspid valve endocarditis now with MRSA.  [facility] ?  ?Chronic pain syndrome ?Chronic lumbar pain ?MR T and L-spine unrevealing.  Continue oxycodone as needed, Lyrica, gabapentin, baclofen. ?  ?Iron deficiency anemia ?Iron panel with serum iron 22, transferrin saturation 7, ferritin 51, TIBC 312.  Continue ferrous gluconate 324 mg p.o. daily ?  ?Ambulatory dysfunction/physical deconditioning: ?Continue PT/OT efforts while inpatient,  recommend rolling walker on discharge and outpatient physical therapy. ?  ?Hx of lice,  not POA ?Etiology likely brought in from outside visitors.  Patient completed permethrin on 08/31/2021.  Unfortunately patient's belongings were placed in the plastic bags but were not treated at that time, patient instructed that she is not open these bags while in the hospital as they are considered contaminated. ?  ?Hyponatremia ?Treated with IV fluid hydration, resolved. ? ?Discharge Diagnoses:  ?Principal Problem: ?  MRSA bacteremia ?Active Problems: ?  Endocarditis of tricuspid valve ?  History of bacterial endocarditis ?  Septic pulmonary embolism (HCC) ?  Opioid use disorder ?  IV drug abuse (HCC) ?  Acute pulmonary embolism (HCC) ?  Chronic lumbar pain ?  Iron deficiency anemia ?  Ambulatory dysfunction ?  Chronic pain ?  History of lice ? ? ? ?Discharge Instructions ? ?Discharge Instructions   ? ? Call MD for:  difficulty breathing, headache or visual disturbances   Complete by: As directed ?  ? Call MD for:  extreme fatigue   Complete by: As directed ?  ? Call MD for:  persistant dizziness or light-headedness   Complete by: As directed ?  ? Call MD for:  persistant nausea and vomiting   Complete by: As directed ?  ? Call MD for:  severe uncontrolled pain   Complete by: As directed ?  ? Call MD for:  temperature >100.4   Complete by: As directed ?  ? Diet - low sodium heart healthy   Complete by: As directed ?  ? Increase activity slowly   Complete by: As directed ?  ? ?  ? ?Allergies as of 09/17/2021   ? ?   Reactions  ? Naltrexone Anxiety  ? Severe anxiety, restlessness, vomiting  ? ?  ? ?  ?Medication List  ?  ? ?STOP taking these medications   ? ?linezolid 600 MG tablet ?Commonly known as: ZYVOX ?  ? ?  ? ?TAKE these medications   ? ?baclofen 10 MG tablet ?Commonly known as: LIORESAL ?Take 1 tablet (10 mg total) by mouth daily as needed for up to 7 days for muscle spasms. ?  ?docusate sodium 100 MG capsule ?Commonly  known as: COLACE ?Take 1 capsule (100 mg total) by mouth 2 (two) times daily as needed for mild constipation. ?  ?Eliquis DVT/PE Starter Pack ?Generic drug: Apixaban Starter Pack (10mg  and 5mg ) ?Take as directed on package: start with two-5mg  tablets twice daily for 7 days. On day 8, switch to one-5mg  tablet twice daily. ?  ?ferrous gluconate 324 MG tablet ?Commonly known as: FERGON ?Take 1 tablet (324 mg total) by mouth daily with breakfast. ?  ?gabapentin 600 MG tablet ?Commonly known as: NEURONTIN ?Take 0.5 tablets (300 mg total) by mouth 3 (three) times daily. ?  ?oxyCODONE 5 MG immediate release tablet ?Commonly known as: Roxicodone ?Take 1 tablet (5 mg total) by mouth every 8 (eight) hours as needed. ?  ?pantoprazole 40 MG tablet ?Commonly known as: PROTONIX ?Take 1 tablet (40 mg total) by mouth daily. ?  ?pregabalin 75 MG capsule ?Commonly known as: LYRICA ?Take 1 capsule (75 mg total) by mouth 3 (three) times daily. ?  ? ?  ? ?  ?  ? ? ?  ?Durable Medical Equipment  ?(From admission, onward)  ?  ? ? ?  ? ?  Start     Ordered  ? 09/15/21 1025  For home use only DME Walker rolling  Once       ?Question Answer Comment  ?Walker: With 5 Inch  Wheels   ?Patient needs a walker to treat with the following condition Gait abnormality   ?  ? 09/15/21 1024  ? ?  ?  ? ?  ? ? Follow-up Information   ? ? Services, [facility] Follow up.   ?Why: Referral sent 3/30 ?Contact information: ?[address] ?[place] [address] ?[phone] ? ? ?  ?  ? ? [facility]. Go on 09/22/2021.   ?Why: Please follow up with the clinic for a hospital follow up on Friday, Sep 22, 2021 at 1430.  If you need to reschedule please call the above listed number. ?Contact information: ?[address] ?[place] [address] ?[phone] ? ?  ?  ? ? Raymondo Band, MD. Go on 09/26/2021.   ?Specialty: Infectious Diseases ?Why: 10:00am ?Contact information: ?[address] ?[address] ?[place] [address] ?[phone] ? ? ?  ?  ? ?  ?  ? ?  ? ?Allergies  ?Allergen Reactions  ? Naltrexone Anxiety  ?  Severe anxiety, restlessness, vomiting  ? ? ?Consultations: ?Infectious disease ?CTS, Dr. Cliffton Asters

## 2021-09-18 ENCOUNTER — Inpatient Hospital Stay: Payer: Medicaid Other | Admitting: Internal Medicine

## 2021-09-22 ENCOUNTER — Inpatient Hospital Stay: Payer: Medicaid Other | Admitting: Nurse Practitioner

## 2021-09-26 ENCOUNTER — Ambulatory Visit (INDEPENDENT_AMBULATORY_CARE_PROVIDER_SITE_OTHER): Payer: Medicaid Other | Admitting: Internal Medicine

## 2021-09-26 ENCOUNTER — Other Ambulatory Visit (HOSPITAL_COMMUNITY): Payer: Self-pay

## 2021-09-26 ENCOUNTER — Other Ambulatory Visit: Payer: Self-pay

## 2021-09-26 ENCOUNTER — Encounter: Payer: Self-pay | Admitting: Internal Medicine

## 2021-09-26 VITALS — BP 119/87 | HR 104 | Temp 97.8°F | Resp 16 | Ht 66.0 in | Wt 182.0 lb

## 2021-09-26 DIAGNOSIS — F1990 Other psychoactive substance use, unspecified, uncomplicated: Secondary | ICD-10-CM

## 2021-09-26 DIAGNOSIS — I079 Rheumatic tricuspid valve disease, unspecified: Secondary | ICD-10-CM

## 2021-09-26 DIAGNOSIS — I2690 Septic pulmonary embolism without acute cor pulmonale: Secondary | ICD-10-CM

## 2021-09-26 DIAGNOSIS — M5416 Radiculopathy, lumbar region: Secondary | ICD-10-CM

## 2021-09-26 DIAGNOSIS — M545 Low back pain, unspecified: Secondary | ICD-10-CM | POA: Diagnosis not present

## 2021-09-26 DIAGNOSIS — I2694 Multiple subsegmental pulmonary emboli without acute cor pulmonale: Secondary | ICD-10-CM | POA: Diagnosis not present

## 2021-09-26 DIAGNOSIS — A4102 Sepsis due to Methicillin resistant Staphylococcus aureus: Secondary | ICD-10-CM

## 2021-09-26 MED ORDER — LINEZOLID 600 MG PO TABS: 600.0000 mg | ORAL_TABLET | Freq: Two times a day (BID) | ORAL | 0 refills | Status: DC

## 2021-09-26 NOTE — Addendum Note (Signed)
Addended by: Harley Alto on: 09/26/2021 11:58 AM   Modules accepted: Orders

## 2021-09-26 NOTE — Progress Notes (Signed)
[facility]  Patient Active Problem List   Diagnosis Date Noted   Acute pulmonary embolism (HCC) 09/13/2021   Chronic pain 09/07/2021   Chronic lumbar pain 09/06/2021   Ambulatory dysfunction 09/06/2021   History of lice 09/06/2021   MRSA bacteremia 08/27/2021   Septic pulmonary embolism (HCC) 08/27/2021   History of bacterial endocarditis 08/02/2021   Microcytic hypochromic anemia 08/03/2019   MRSA (methicillin resistant staph aureus) culture positive 08/03/2019   Infection of lumbar spine (HCC) 08/03/2019   Lumbar discitis 07/31/2019   Endocarditis of tricuspid valve 07/29/2019   IV drug abuse (HCC) 07/29/2019   Opioid use disorder 04/05/2019   HCV antibody positive 04/05/2019   Iron deficiency anemia 01/10/2019      Subjective:    Patient ID: Latoya [name], female    DOB: [birth year], 36 y.o.   MRN: [id number]  Chief Complaint  Patient presents with   Hospitalization Follow-up    Patient reports SOB, coughing, dizziness. Patient reports having a fever every night - highest reading 103.     HPI:  Latoya [name] is a 36 y.o. female ivdu, MSSA TV endocarditis in 2020( treated with cefazolin and angiovac at [facility]),  MRSA L4-L5 facet joint septic arthritis in 07/2019( IV Vancomycin >>Linezolid  on discharge? compliance) admitted 08/03/21 to [place] with lower back pain/leg pain, meeting sepsis criteria found to have tv endocarditis and septic pulmonary emboli with mrsa. She is here for follow up hospital discharge  She finishes vancomycin on 5/13 for 6 week course  She had cts surgery evaluation but deemed not angiovac candidate due to PFO. (Although she had it before at [facility])  She had a repeat echo tte that showed larger tv vegetation and also imaging ct near the end of her vanc course that showed segmental large PE L>R with right heart strain. I discussed with radiology at that time that we suspect has primary pe rather than embolic. And  also the tv enlarging we had cts evaluated again and due to same reason with PFO not candidate. Blood cultures was negative and she has no other sign of metastatic mrsa process  Today 09/26/2021 she is here for f/u Since the release from the hospital, she would have these coughing spells where she feels she can't breathe. She has a few coughing spells daily. She has been trying to walk a lot more without the walker but reports pain in the lower back and the leg left side (from the lateral hip to the knee). Not working with physical therapy. She has pain the legs/lower back at hospital admission but does clarify if she sits around/rest it is better, only the same level of pain as during initial admission if she walks   She gets fever every night. Highest was 103. Last night 5/22 was 100.8  Since abx stopped she feels it has been the same  She doesn't have a cardiology/cts appointment  She is taking eliquis. No bruising/bleeding. Hasn't had a menstrual cycle for at least 2 years  No visual change, rash, knees/ankles/hip/shoulders/elbows/wrists/feet joint pain No n/v  Has intermittent headache but think its worse. No focal numbness/tingling/weakness   Allergies  Allergen Reactions   Naltrexone Anxiety    Severe anxiety, restlessness, vomiting      Outpatient Medications Prior to Visit  Medication Sig Dispense Refill   APIXABAN (ELIQUIS) VTE STARTER PACK (10MG  AND 5MG ) Take as directed on package: start with two-5mg  tablets twice daily for 7  days. On day 8, switch to one-5mg  tablet twice daily. 74 each 0   Docusate Sodium (DSS) 100 MG CAPS Take 1 capsule (100 mg total) by mouth 2 (two) times daily as needed for mild constipation. 60 capsule 0   ferrous gluconate (FERGON) 324 MG tablet Take 1 tablet (324 mg total) by mouth daily with breakfast. 30 tablet 0   gabapentin (NEURONTIN) 600 MG tablet Take 0.5 tablets (300 mg total) by mouth 3 (three) times daily. 45 tablet 0   pantoprazole  (PROTONIX) 40 MG tablet Take 1 tablet (40 mg total) by mouth daily. 30 tablet 0   pregabalin (LYRICA) 75 MG capsule Take 1 capsule (75 mg total) by mouth 3 (three) times daily. 90 capsule 0   oxyCODONE (ROXICODONE) 5 MG immediate release tablet Take 1 tablet (5 mg total) by mouth every 8 (eight) hours as needed. (Patient not taking: Reported on 09/26/2021) 15 tablet 0   No facility-administered medications prior to visit.     Social History   Socioeconomic History   Marital status: Single    Spouse name: Not on file   Number of children: Not on file   Years of education: Not on file   Highest education level: Not on file  Occupational History   Not on file  Tobacco Use   Smoking status: Light Smoker    Types: Cigarettes   Smokeless tobacco: Never  Vaping Use   Vaping Use: Never used  Substance and Sexual Activity   Alcohol use: Yes   Drug use: Yes    Types: Heroin    Comment: Heroin    Sexual activity: Yes  Other Topics Concern   Not on file  Social History Narrative   Not on file   Social Determinants of Health   Financial Resource Strain: Not on file  Food Insecurity: Not on file  Transportation Needs: Not on file  Physical Activity: Not on file  Stress: Not on file  Social Connections: Not on file  Intimate Partner Violence: Not on file      Review of Systems    All other ros negative  Objective:    BP 119/87   Pulse (!) 104   Temp 97.8 F (36.6 C) (Oral)   Resp 16   Ht  (1.676 m)   Wt 182 lb (82.6 kg)   SpO2 97%   BMI 29.38 kg/m  Nursing note and vital signs reviewed.  Physical Exam     General/constitutional: no distress, pleasant HEENT: Normocephalic, PER, Conj Clear, EOMI, Oropharynx clear Neck supple CV: rrr no mrg Lungs: clear to auscultation, normal respiratory effort Abd: Soft, Nontender Ext: no edema Skin: No Rash Neuro: nonfocal-- ginger gait favoring the right LE. Walks with fww MSK: no peripheral joint  swelling/tenderness/warmth; tender midline along L-spines vertebrae    Labs: Lab Results  Component Value Date   WBC 11.8 (H) 09/15/2021   HGB 7.7 (L) 09/15/2021   HCT 24.9 (L) 09/15/2021   MCV 78.1 (L) 09/15/2021   PLT 197 09/15/2021   Last metabolic panel Lab Results  Component Value Date   GLUCOSE 105 (H) 09/15/2021   NA 132 (L) 09/15/2021   K 3.9 09/15/2021   CL 103 09/15/2021   CO2 21 (L) 09/15/2021   BUN 10 09/15/2021   CREATININE 0.59 09/15/2021   GFRNONAA >60 09/15/2021   CALCIUM 8.1 (L) 09/15/2021   PHOS 3.5 09/13/2021   PROT 6.9 09/15/2021   ALBUMIN 2.5 (L) 09/15/2021   BILITOT 0.8  09/15/2021   ALKPHOS 67 09/15/2021   AST 12 (L) 09/15/2021   ALT 11 09/15/2021   ANIONGAP 8 09/15/2021   Lab Results  Component Value Date   CRP 24.4 (H) 09/13/2021     Micro:  Serology:  Imaging: Imaging: I have reviewed/incorporated imaging finding into today's assessment and plan     5/9 chest ct 1. Bilateral pulmonary emboli, most pronounced on the left involving the left pulmonary artery, lobar, segmental, and subsegmental arteries. The right ventricle is distended suggesting right heart strain. 2. Patchy infiltrates in the lungs bilaterally with new perihilar consolidation on the left. A few residual cavitary lesions are noted in the lungs bilaterally, compatible with septic emboli. 3. Small bilateral pleural effusions. 4. Splenomegaly. 5. Mild cardiomegaly with trace pericardial effusion.   5/10 le u/s duplex No dvt bilateral LE   5/10 tte Compared to prior TTE on 09/11/21, a tricuspid valve  vegetation is again visualized and appears larger on current study. The RV appears enlarged with evidence of RV volume overload.   08/04/21 mri t spine 1. No evidence for osteomyelitis discitis or septic arthritis within the thoracic spine. 2. Multiple cavitary nodule throughout the visualized lungs, consistent with septic emboli. Associated small layering  bilateral pleural effusions, right greater than left. 3. No significant disc pathology within the thoracic spine. No stenosis or neural impingement.    3/29 mr l spine Unremarkable lumbar spine MRI. No MR findings suspicious for discitis or osteomyelitis.   Assessment & Plan:   Problem List Items Addressed This Visit       Cardiovascular and Mediastinum   Endocarditis of tricuspid valve   Relevant Orders   Culture, blood (routine x 2)   CBC   C-reactive protein   COMPLETE METABOLIC PANEL WITH GFR   MR Lumbar Spine W Wo Contrast   ECHOCARDIOGRAM COMPLETE   Septic pulmonary embolism (HCC)   Other Visit Diagnoses     IVDU (intravenous drug user)    -  Primary   Sepsis due to methicillin resistant Staphylococcus aureus (MRSA), unspecified whether acute organ dysfunction present [facility](HCC)       Relevant Orders   CT CHEST ABDOMEN PELVIS W CONTRAST   Acute midline low back pain, unspecified whether sciatica present       Lumbar radiculopathy       Multiple subsegmental pulmonary emboli without acute cor pulmonale (HCC)           Abx: Vanc 3/29-5/13  09/26/2021 id clinic assessment From inpatient's assessment: 35 [facility]pid valve endocard[facility]. She is currently on vancomycin. [facility].  #MRSA native TV endocarditis complicated with septic pulmonary emboli with severe MR #lower back pain negative thoracolumbar mri #IVDA #Tobacco abuse -Hx of MSSA TV endocarditis SP angiovac and treated with cefazolin in 2020 at [facility] -MRSA L4-L5 facet joint septic arthritis in 07/2019 -Hep B surface ag negative, HCV RNA negative, HIV negative -Angiovac on 4/7 was aborted as evidence of PFO noted -doing well on vancomycin (6wk eot 5/13) -id called for new fever/cp found to have bilateral large pe and right heart strain   Discussed with radiology -- more likely primary PE rather than septic. Agree given end of tx with improved  cavitary lesion/back pain and beyond initial 2 week tx   -no contraindication for anticoagulation from id stand point   Recommendations: --f/u repeat blood cx for this episode of fever, but suspect due to PE - if cultures negative  can finish abx on 5/13 - anticoagulation per primary team for pe; given the right heart strain finding and rather large size, likely would need regardless of type of PE - lice was seen in her hair again and treatment topical is planned per primary team - discussed with primary team/radiology   Addendum Tte repeat reviewed -- larger size vegetation    -- this alone does not define treatment failure. However, it is known that larger persistent vegetation after treatment does have higher risk of embolic complication   5/23 current id clinic assessment Concerning for reported fever, chest pain/cough, lower leg pain and ginger gait in clinic She is on eliquis now for the PE, but I worry if she has persistent infection with mrsa  Unfortunately also her mrsa is resistent to doxy/bactrim  Will start linezolid for now Follow up next week to repeat lab test Mri spine, chest abd pelv ct with contrast Crp/cbc/cmp today Tte  Precaution to go to ED if symptoms worse    I have spent a total of 50 minutes of face-to-face and non-face-to-face time, excluding clinical staff time, preparing to see patient, ordering tests and/or medications, and provide counseling the patient     Follow-up: Return in about 1 week (around 10/03/2021).      Raymondo Band, MD [facility] 09/26/2021, 11:01 AM

## 2021-09-26 NOTE — Patient Instructions (Addendum)
Let's start linezolid today while we are awaiting further workup for ongoing infection   Please arrange yourself a primary care doctor in case you need referral to subspecialist   For now the concern is if there is ongoing spine, chest, or abdomen infection. Will do ct scan of your body and mri of your lumbar spine  Blood tests today  Follow up in 1 week to make sure your body tolerate the linezolid antibiotics    If ongoing fever, worsening breathing or any numbness/tingling/weakness in legs please go to ED

## 2021-09-29 ENCOUNTER — Other Ambulatory Visit (HOSPITAL_COMMUNITY): Payer: Self-pay

## 2021-10-02 LAB — COMPLETE METABOLIC PANEL WITH GFR
AG Ratio: 0.8 (calc) — ABNORMAL LOW (ref 1.0–2.5)
ALT: 23 U/L (ref 6–29)
AST: 16 U/L (ref 10–30)
Albumin: 3.8 g/dL (ref 3.6–5.1)
Alkaline phosphatase (APISO): 71 U/L (ref 31–125)
BUN: 11 mg/dL (ref 7–25)
CO2: 22 mmol/L (ref 20–32)
Calcium: 9 mg/dL (ref 8.6–10.2)
Chloride: 106 mmol/L (ref 98–110)
Creat: 0.6 mg/dL (ref 0.50–0.97)
Globulin: 4.5 g/dL (calc) — ABNORMAL HIGH (ref 1.9–3.7)
Glucose, Bld: 74 mg/dL (ref 65–99)
Potassium: 4.1 mmol/L (ref 3.5–5.3)
Sodium: 141 mmol/L (ref 135–146)
Total Bilirubin: 0.4 mg/dL (ref 0.2–1.2)
Total Protein: 8.3 g/dL — ABNORMAL HIGH (ref 6.1–8.1)
eGFR: 121 mL/min/{1.73_m2} (ref >=60)

## 2021-10-02 LAB — CBC
HCT: 30 % — ABNORMAL LOW (ref 35.0–45.0)
Hemoglobin: 9.2 g/dL — ABNORMAL LOW (ref 11.7–15.5)
MCH: 23.6 pg — ABNORMAL LOW (ref 27.0–33.0)
MCHC: 30.7 g/dL — ABNORMAL LOW (ref 32.0–36.0)
MCV: 76.9 fL — ABNORMAL LOW (ref 80.0–100.0)
MPV: 10.6 fL (ref 7.5–12.5)
Platelets: 337 10*3/uL (ref 140–400)
RBC: 3.9 10*6/uL (ref 3.80–5.10)
RDW: 17.4 % — ABNORMAL HIGH (ref 11.0–15.0)
WBC: 10.1 10*3/uL (ref 3.8–10.8)

## 2021-10-02 LAB — CULTURE, BLOOD (SINGLE)
MICRO NUMBER:: 13436108
Result:: NO GROWTH
SPECIMEN QUALITY:: ADEQUATE

## 2021-10-02 LAB — C-REACTIVE PROTEIN: CRP: 63.3 mg/L — ABNORMAL HIGH (ref <8.0)

## 2021-10-03 ENCOUNTER — Encounter: Payer: Self-pay | Admitting: Internal Medicine

## 2021-10-03 ENCOUNTER — Ambulatory Visit (INDEPENDENT_AMBULATORY_CARE_PROVIDER_SITE_OTHER): Payer: Medicaid Other | Admitting: Internal Medicine

## 2021-10-03 ENCOUNTER — Other Ambulatory Visit: Payer: Self-pay

## 2021-10-03 ENCOUNTER — Ambulatory Visit (HOSPITAL_COMMUNITY): Payer: Medicaid Other

## 2021-10-03 VITALS — BP 119/88 | HR 94 | Temp 98.0°F | Wt 185.2 lb

## 2021-10-03 DIAGNOSIS — I330 Acute and subacute infective endocarditis: Secondary | ICD-10-CM | POA: Diagnosis not present

## 2021-10-03 DIAGNOSIS — R7881 Bacteremia: Secondary | ICD-10-CM

## 2021-10-03 DIAGNOSIS — B9562 Methicillin resistant Staphylococcus aureus infection as the cause of diseases classified elsewhere: Secondary | ICD-10-CM

## 2021-10-03 DIAGNOSIS — R0781 Pleurodynia: Secondary | ICD-10-CM | POA: Diagnosis not present

## 2021-10-03 DIAGNOSIS — M545 Low back pain, unspecified: Secondary | ICD-10-CM | POA: Diagnosis not present

## 2021-10-03 MED ORDER — LINEZOLID 600 MG PO TABS: 600.0000 mg | ORAL_TABLET | Freq: Two times a day (BID) | ORAL | 0 refills | Status: DC

## 2021-10-03 NOTE — Patient Instructions (Signed)
Continue antibiotics linezolid. Will prescribe you another 2 weeks  Until we have the back mri, chest ct, and echo repeat, we won't know what to do with the antibiotics  Record temperature and bring the journal next visit   Will see you in another week

## 2021-10-03 NOTE — Progress Notes (Signed)
[facility]  Patient Active Problem List   Diagnosis Date Noted   Acute pulmonary embolism (HCC) 09/13/2021   Chronic pain 09/07/2021   Chronic lumbar pain 09/06/2021   Ambulatory dysfunction 09/06/2021   History of lice 09/06/2021   MRSA bacteremia 08/27/2021   Septic pulmonary embolism (HCC) 08/27/2021   History of bacterial endocarditis 08/02/2021   Microcytic hypochromic anemia 08/03/2019   MRSA (methicillin resistant staph aureus) culture positive 08/03/2019   Infection of lumbar spine (HCC) 08/03/2019   Lumbar discitis 07/31/2019   Endocarditis of tricuspid valve 07/29/2019   IV drug abuse (HCC) 07/29/2019   Opioid use disorder 04/05/2019   HCV antibody positive 04/05/2019   Iron deficiency anemia 01/10/2019      Subjective:    Patient ID: Latoya [name], female    DOB: 07-27-1985, 36 y.o.   MRN: [id number]  Chief Complaint  Patient presents with   Follow-up    1 week follow up - patient reports she isn't as short of breath anymore - lungs still hurt, sore throat and coughing.     HPI:  Latoya [name] is a 36 y.o. female ivdu, MSSA TV endocarditis in 2020( treated with cefazolin and angiovac at [facility]),  MRSA L4-L5 facet joint septic arthritis in 07/2019( IV Vancomycin >>Linezolid  on discharge? compliance) admitted 08/03/21 to [place] with lower back pain/leg pain, meeting sepsis criteria found to have tv endocarditis and septic pulmonary emboli with mrsa. She is here for follow up hospital discharge  She finishes vancomycin on 5/13 for 6 week course  She had cts surgery evaluation but deemed not angiovac candidate due to PFO. (Although she had it before at [facility])  She had a repeat echo tte that showed larger tv vegetation and also imaging ct near the end of her vanc course that showed segmental large PE L>R with right heart strain. I discussed with radiology at that time that we suspect has primary pe rather than embolic. And also  the tv enlarging we had cts evaluated again and due to same reason with PFO not candidate. Blood cultures was negative and she has no other sign of metastatic mrsa process  Today 09/26/2021 she is here for f/u Since the release from the hospital, she would have these coughing spells where she feels she can't breathe. She has a few coughing spells daily. She has been trying to walk a lot more without the walker but reports pain in the lower back and the leg left side (from the lateral hip to the knee). Not working with physical therapy. She has pain the legs/lower back at hospital admission but does clarify if she sits around/rest it is better, only the same level of pain as during initial admission if she walks   She gets fever every night. Highest was 103. Last night 5/22 was 100.8  Since abx stopped she feels it has been the same  She doesn't have a cardiology/cts appointment  She is taking eliquis. No bruising/bleeding. Hasn't had a menstrual cycle for at least 2 years  No visual change, rash, knees/ankles/hip/shoulders/elbows/wrists/feet joint pain No n/v  Has intermittent headache but think its worse. No focal numbness/tingling/weakness    ---------------- 10/03/21 assessment Doing better with back pain and pleuritic chest pain, less dyspnea/cough Still febrile Tolerating linezolid No n/v/diarrhea No numbness/tingling    Allergies  Allergen Reactions   Naltrexone Anxiety    Severe anxiety, restlessness, vomiting      Outpatient Medications  Prior to Visit  Medication Sig Dispense Refill   APIXABAN (ELIQUIS) VTE STARTER PACK (10MG  AND 5MG ) Take as directed on package: start with two-5mg  tablets twice daily for 7 days. On day 8, switch to one-5mg  tablet twice daily. 74 each 0   Docusate Sodium (DSS) 100 MG CAPS Take 1 capsule (100 mg total) by mouth 2 (two) times daily as needed for mild constipation. 60 capsule 0   ferrous gluconate (FERGON) 324 MG tablet Take 1 tablet (324  mg total) by mouth daily with breakfast. 30 tablet 0   gabapentin (NEURONTIN) 600 MG tablet Take 0.5 tablets (300 mg total) by mouth 3 (three) times daily. 45 tablet 0   linezolid (ZYVOX) 600 MG tablet Take 1 tablet (600 mg total) by mouth 2 (two) times daily. 60 tablet 0   pantoprazole (PROTONIX) 40 MG tablet Take 1 tablet (40 mg total) by mouth daily. 30 tablet 0   pregabalin (LYRICA) 75 MG capsule Take 1 capsule (75 mg total) by mouth 3 (three) times daily. 90 capsule 0   oxyCODONE (ROXICODONE) 5 MG immediate release tablet Take 1 tablet (5 mg total) by mouth every 8 (eight) hours as needed. (Patient not taking: Reported on 09/26/2021) 15 tablet 0   No facility-administered medications prior to visit.     Social History   Socioeconomic History   Marital status: Single    Spouse name: Not on file   Number of children: Not on file   Years of education: Not on file   Highest education level: Not on file  Occupational History   Not on file  Tobacco Use   Smoking status: Light Smoker    Types: Cigarettes   Smokeless tobacco: Never  Vaping Use   Vaping Use: Never used  Substance and Sexual Activity   Alcohol use: Yes   Drug use: Yes    Types: Heroin    Comment: Heroin    Sexual activity: Yes  Other Topics Concern   Not on file  Social History Narrative   Not on file   Social Determinants of Health   Financial Resource Strain: Not on file  Food Insecurity: Not on file  Transportation Needs: Not on file  Physical Activity: Not on file  Stress: Not on file  Social Connections: Not on file  Intimate Partner Violence: Not on file      Review of Systems    All other ros negative  Objective:    Wt 185 lb 3.2 oz (84 kg)   BMI 29.89 kg/m  Nursing note and vital signs reviewed.  Physical Exam     General/constitutional: no distress, pleasant HEENT: Normocephalic, PER, Conj Clear, EOMI, Oropharynx clear Neck supple CV: rrr no mrg Lungs: clear to auscultation,  normal respiratory effort Abd: Soft, Nontender Ext: no edema Skin: No Rash Neuro: nonfocal MSK: still ginger gait favoring right LE, but improved from previous     Labs: Lab Results  Component Value Date   WBC 10.1 09/26/2021   HGB 9.2 (L) 09/26/2021   HCT 30.0 (L) 09/26/2021   MCV 76.9 (L) 09/26/2021   PLT 337 09/26/2021   Last metabolic panel Lab Results  Component Value Date   GLUCOSE 74 09/26/2021   NA 141 09/26/2021   K 4.1 09/26/2021   CL 106 09/26/2021   CO2 22 09/26/2021   BUN 11 09/26/2021   CREATININE 0.60 09/26/2021   GFRNONAA >60 09/15/2021   CALCIUM 9.0 09/26/2021   PHOS 3.5 09/13/2021   PROT 8.3 (  H) 09/26/2021   ALBUMIN 2.5 (L) 09/15/2021   BILITOT 0.4 09/26/2021   ALKPHOS 67 09/15/2021   AST 16 09/26/2021   ALT 23 09/26/2021   ANIONGAP 8 09/15/2021   Lab Results  Component Value Date   CRP 63.3 (H) 09/26/2021     Micro:  Serology:  Imaging: Imaging: I have reviewed/incorporated imaging finding into today's assessment and plan     5/9 chest ct 1. Bilateral pulmonary emboli, most pronounced on the left involving the left pulmonary artery, lobar, segmental, and subsegmental arteries. The right ventricle is distended suggesting right heart strain. 2. Patchy infiltrates in the lungs bilaterally with new perihilar consolidation on the left. A few residual cavitary lesions are noted in the lungs bilaterally, compatible with septic emboli. 3. Small bilateral pleural effusions. 4. Splenomegaly. 5. Mild cardiomegaly with trace pericardial effusion.   5/10 le u/s duplex No dvt bilateral LE   5/10 tte Compared to prior TTE on 09/11/21, a tricuspid valve  vegetation is again visualized and appears larger on current study. The RV appears enlarged with evidence of RV volume overload.   08/04/21 mri t spine 1. No evidence for osteomyelitis discitis or septic arthritis within the thoracic spine. 2. Multiple cavitary nodule throughout the  visualized lungs, consistent with septic emboli. Associated small layering bilateral pleural effusions, right greater than left. 3. No significant disc pathology within the thoracic spine. No stenosis or neural impingement.    3/29 mr l spine Unremarkable lumbar spine MRI. No MR findings suspicious for discitis or osteomyelitis.   Assessment & Plan:   Problem List Items Addressed This Visit       Other   MRSA bacteremia - Primary   Relevant Orders   C-reactive protein   CBC   COMPLETE METABOLIC PANEL WITH GFR   Other Visit Diagnoses     Bacterial endocarditis, unspecified chronicity       Relevant Medications   linezolid (ZYVOX) 600 MG tablet   Other Relevant Orders   C-reactive protein   CBC   COMPLETE METABOLIC PANEL WITH GFR   Midline low back pain without sciatica, unspecified chronicity       Relevant Orders   C-reactive protein   CBC   COMPLETE METABOLIC PANEL WITH GFR   Pleuritic chest pain       Relevant Orders   C-reactive protein   CBC   COMPLETE METABOLIC PANEL WITH GFR      Abx: Vanc 3/29-5/13  09/26/2021 id clinic assessment From inpatient's assessment: 35 YF transferred from [facility] for MRSA tricuspid valve endocarditis. She is currently on vancomycin. Transferred from [facility] for angiovac.  #MRSA native TV endocarditis complicated with septic pulmonary emboli with severe MR #lower back pain negative thoracolumbar mri #IVDA #Tobacco abuse -Hx of MSSA TV endocarditis SP angiovac and treated with cefazolin in 2020 at [facility] -MRSA L4-L5 facet joint septic arthritis in 07/2019 -Hep B surface ag negative, HCV RNA negative, HIV negative -Angiovac on 4/7 was aborted as evidence of PFO noted -doing well on vancomycin (6wk eot 5/13) -id called for new fever/cp found to have bilateral large pe and right heart strain   Discussed with radiology -- more likely primary PE rather than septic. Agree given end of tx with improved cavitary lesion/back pain and  beyond initial 2 week tx   -no contraindication for anticoagulation from id stand point   Recommendations: --f/u repeat blood cx for this episode of fever, but suspect due to PE - if cultures negative can  finish abx on 5/13 - anticoagulation per primary team for pe; given the right heart strain finding and rather large size, likely would need regardless of type of PE - lice was seen in her hair again and treatment topical is planned per primary team - discussed with primary team/radiology   Addendum Tte repeat reviewed -- larger size vegetation    -- this alone does not define treatment failure. However, it is known that larger persistent vegetation after treatment does have higher risk of embolic complication   5/23 current id clinic assessment Concerning for reported fever, chest pain/cough, lower leg pain and ginger gait in clinic She is on eliquis now for the PE, but I worry if she has persistent infection with mrsa  Unfortunately also her mrsa is resistent to doxy/bactrim  Will start linezolid for now Follow up next week to repeat lab test Mri spine, chest abd pelv ct with contrast Crp/cbc/cmp today Tte  Precaution to go to ED if symptoms worse   --------------- 5/30 assessment Clinically doing better after linezolid started on 5/23 Blood culture negative (prior to restarting antibiotics Labs showed very elevated inflammatory marker  -will recheck labs today to assess for linezolid toxicity and inflammatory labs -the mri and tte won't be available till another week -will prescribe another 2 weeks of linezolid while we are waiting. If there are  sign of infection still within the spine will see if we need to get on IV abx as linezolid long term might not be feasible and there is no oral alternative  -assist patient today to get ct of chest.  -f/u next week arranged   I have spent a total of 30 minutes of face-to-face and non-face-to-face time, excluding clinical  staff time, preparing to see patient, ordering tests and/or medications, and provide counseling the patient     Follow-up: Return in about 1 week (around 10/10/2021).      Raymondo Bandrung T Buford Bremer, MD [facility] 10/03/2021, 9:17 AM

## 2021-10-03 NOTE — Addendum Note (Signed)
Addended by: Harley Alto on: 10/03/2021 10:08 AM   Modules accepted: Orders

## 2021-10-05 ENCOUNTER — Telehealth: Payer: Self-pay

## 2021-10-05 DIAGNOSIS — Z419 Encounter for procedure for purposes other than remedying health state, unspecified: Secondary | ICD-10-CM | POA: Diagnosis not present

## 2021-10-05 NOTE — Telephone Encounter (Signed)
Contacted [facility] to do PA.   Prior Authorization pending for Echocardiogram complete. Tracking number is [id number].  Will continue to follow.    Latoya [name], CMA

## 2021-10-07 ENCOUNTER — Other Ambulatory Visit: Payer: Self-pay | Admitting: Internal Medicine

## 2021-10-07 ENCOUNTER — Ambulatory Visit (HOSPITAL_COMMUNITY)
Admission: RE | Admit: 2021-10-07 | Discharge: 2021-10-07 | Disposition: A | Discharge status: Home / Self Care | Payer: Medicaid Other | Source: Ambulatory Visit | Attending: Internal Medicine | Admitting: Internal Medicine

## 2021-10-07 DIAGNOSIS — I079 Rheumatic tricuspid valve disease, unspecified: Secondary | ICD-10-CM | POA: Diagnosis not present

## 2021-10-07 DIAGNOSIS — R7881 Bacteremia: Secondary | ICD-10-CM | POA: Diagnosis not present

## 2021-10-07 DIAGNOSIS — M5126 Other intervertebral disc displacement, lumbar region: Secondary | ICD-10-CM | POA: Diagnosis not present

## 2021-10-07 DIAGNOSIS — M5125 Other intervertebral disc displacement, thoracolumbar region: Secondary | ICD-10-CM | POA: Diagnosis not present

## 2021-10-07 DIAGNOSIS — M47816 Spondylosis without myelopathy or radiculopathy, lumbar region: Secondary | ICD-10-CM | POA: Diagnosis not present

## 2021-10-07 IMAGING — MR MR LUMBAR SPINE W/O CM
4 of 5 series · 18 of 48 positions shown · non-contrast
Comparison: Prior MRI from [DATE].

CLINICAL DATA: Initial evaluation for low back pain, infection
suspected. Recent MRSA bacteremia.

EXAM:
MRI LUMBAR SPINE WITHOUT CONTRAST
TECHNIQUE: Multiplanar, multisequence MR imaging of the lumbar spine was
performed. No intravenous contrast was administered.

[Series 2: T2 · sagittal · 4.0mm · 0.51mm/px · 6 of 15 slices shown (1 of 2)]
[im 1/15]
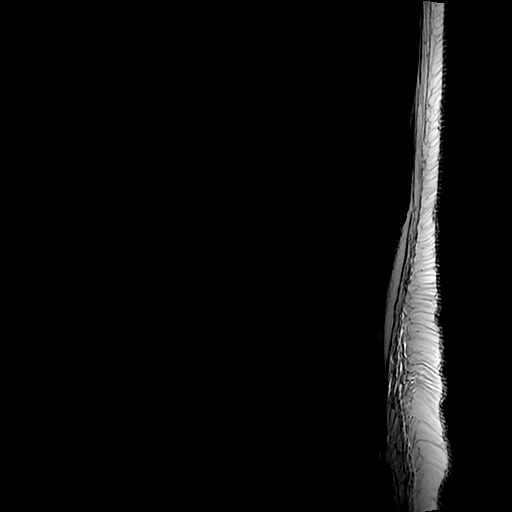
[im 3/15]
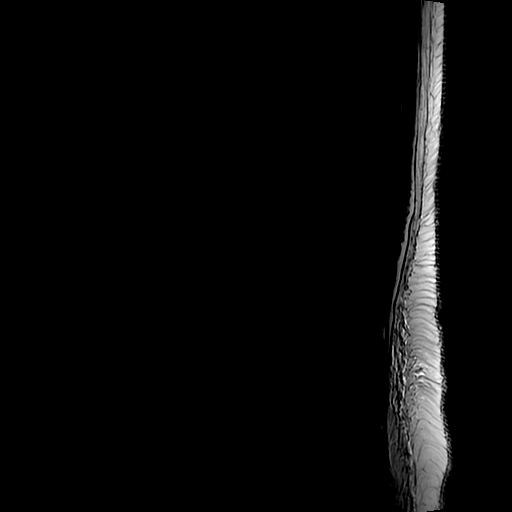
[im 6/15]
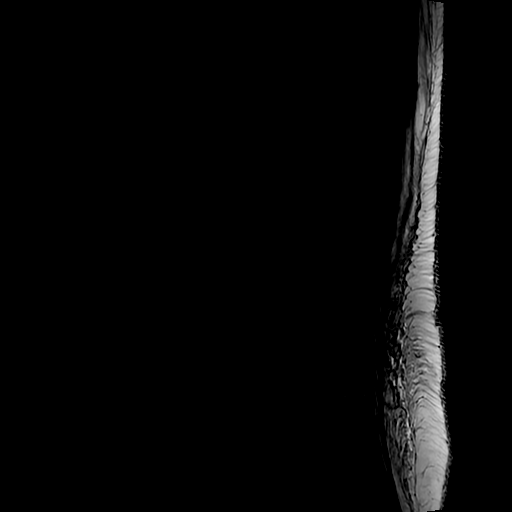
[im 9/15]
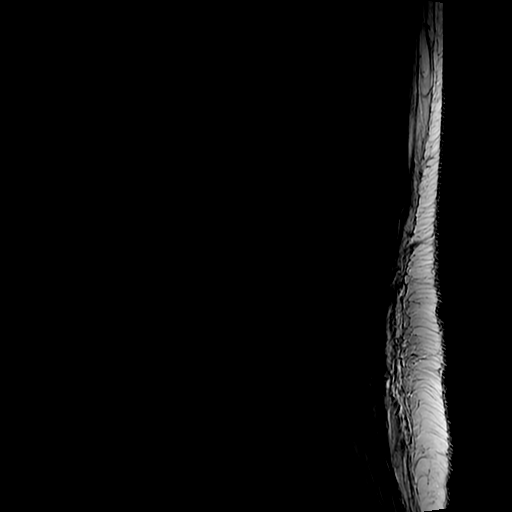
[im 12/15]
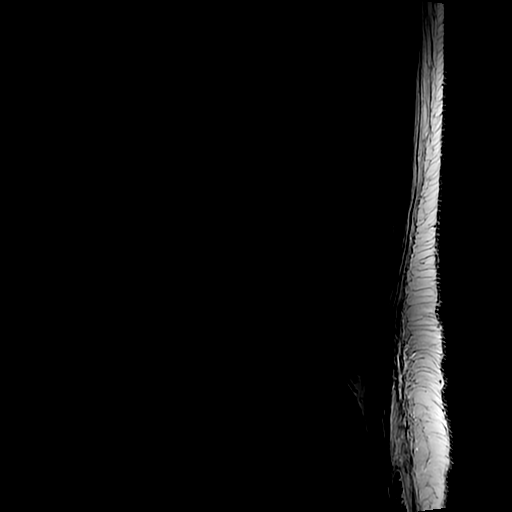
[im 15/15]
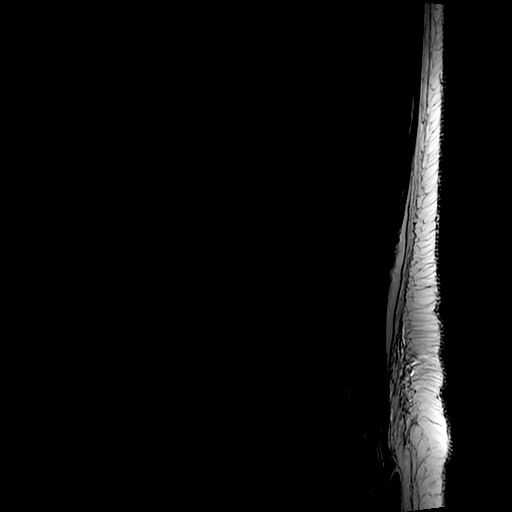

[Series 4: T1 · sagittal · 4.0mm · 0.51mm/px · 3 of 15 slices shown (1 of 2)]
[im 3/15]
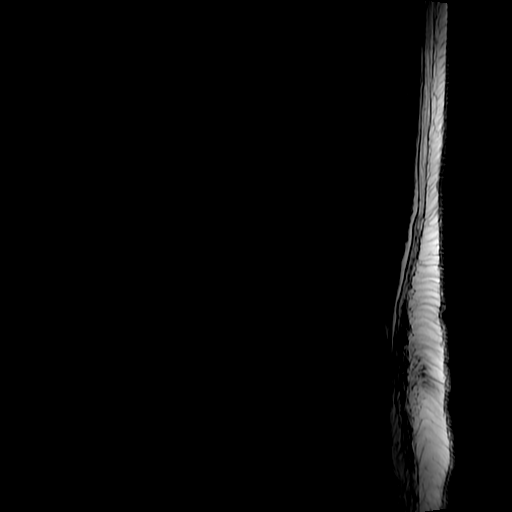
[im 9/15]
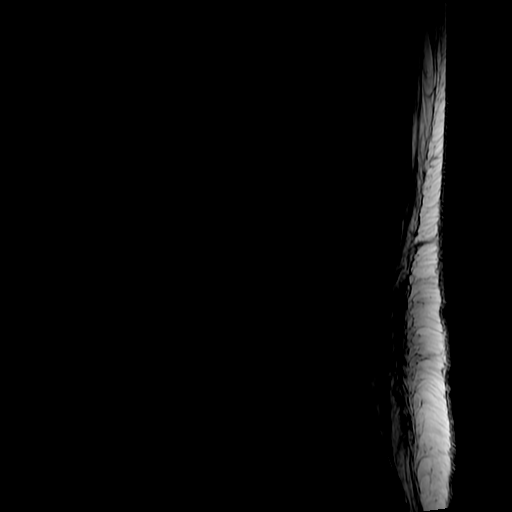
[im 15/15]
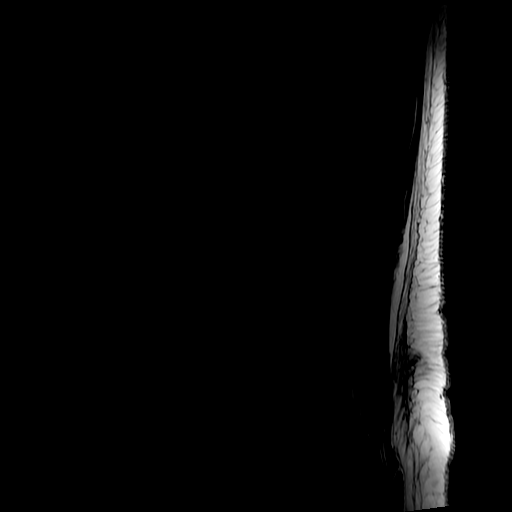

[Series 5: T2 · axial · 4.0mm · 0.39mm/px · z∈[-111,+54]mm · 6 of 40 slices shown (2 of 2)]
[im 1/40]
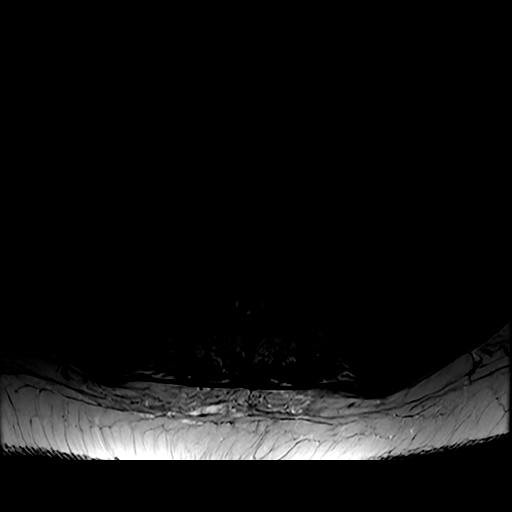
[im 6/40]
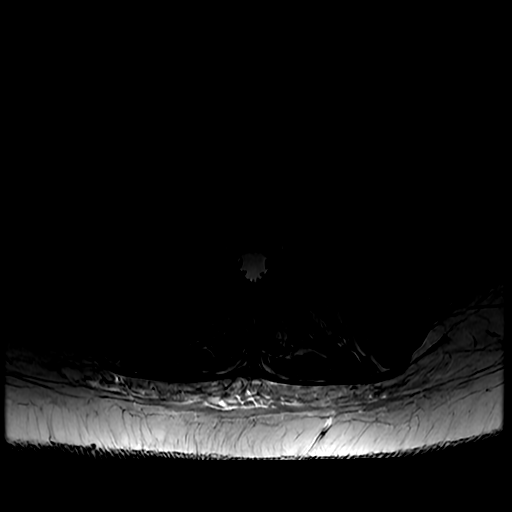
[im 12/40]
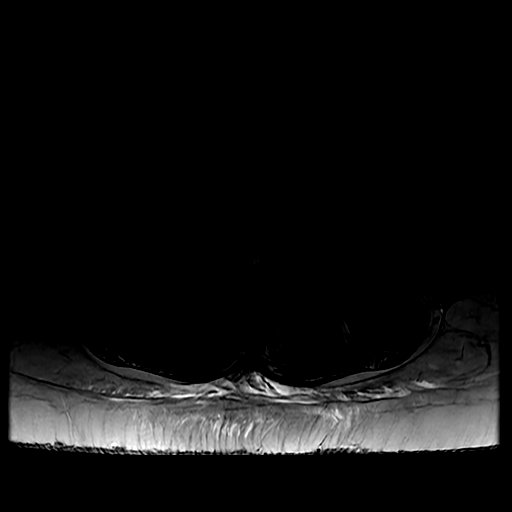
[im 17/40]
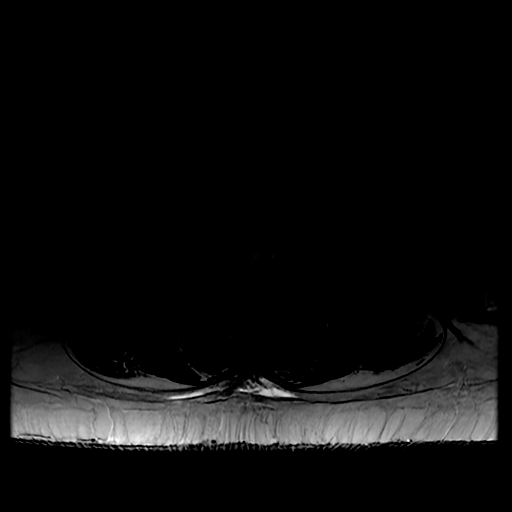
[im 20/40]
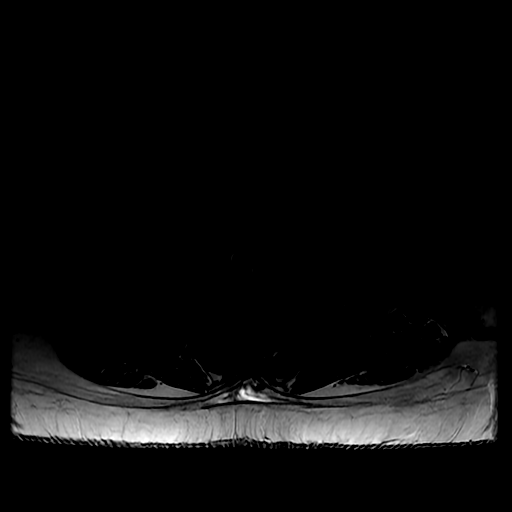
[im 34/40]
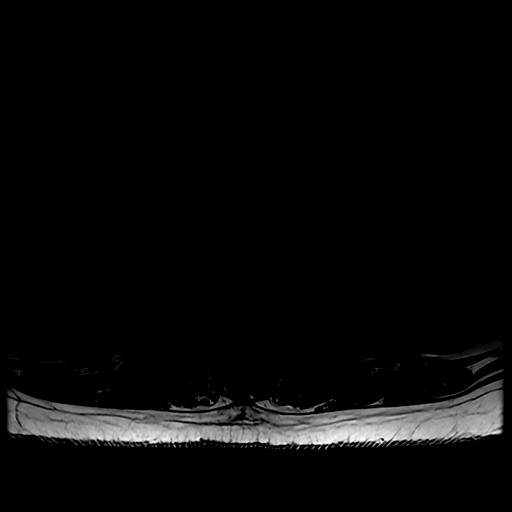

[Series 6: T1 · axial · 4.0mm · 0.39mm/px · z∈[-86,+54]mm · 3 of 40 slices shown (2 of 2)]
[im 6/40]
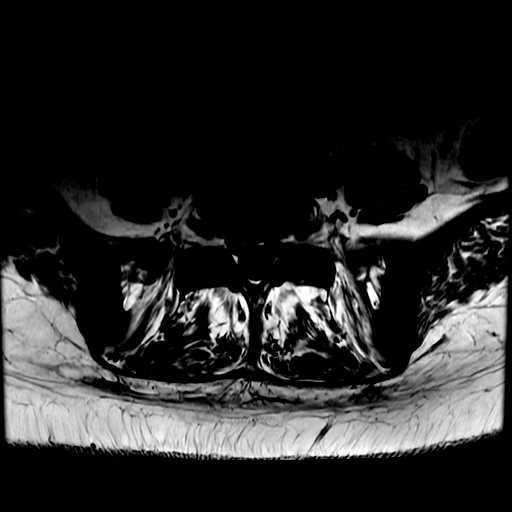
[im 20/40]
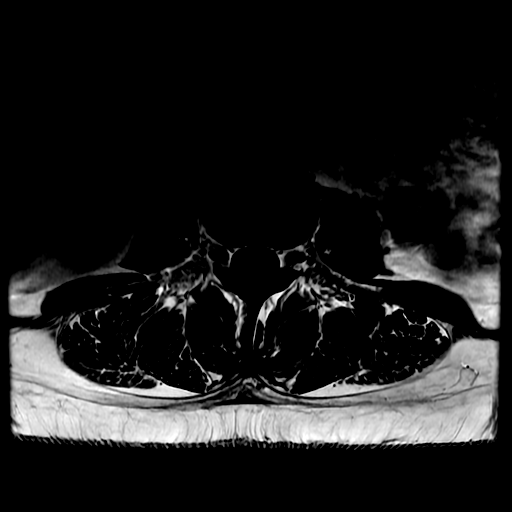
[im 34/40]
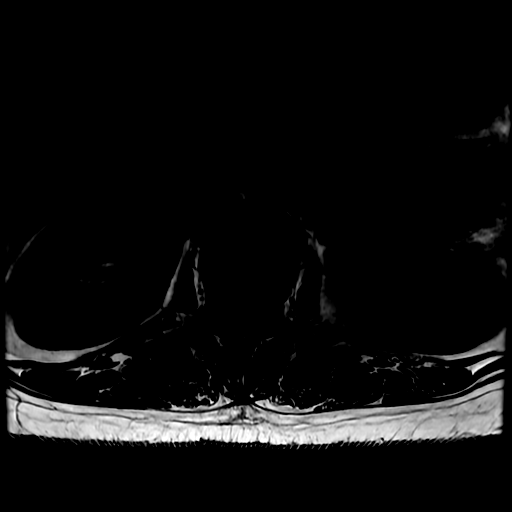

[18 of 48 positions shown; findings below may reference images not displayed]

FINDINGS: Segmentation: Examination degraded by motion artifact.

Standard.  Lowest well-formed disc space labeled the L5-S1 level.

Alignment: Physiologic with preservation of the normal lumbar
lordosis. No listhesis.

Vertebrae: Vertebral body height maintained without acute or chronic
fracture. Bone marrow signal intensity diffusely decreased on T1
weighted sequence, nonspecific, but most commonly related to anemia,
smoking or obesity. Small probable atypical hemangioma noted within
the L3 vertebral body, stable. No other discrete or worrisome
osseous lesions.

Mild reactive marrow edema present about the L4-5 facets
bilaterally, indeterminate, and could be related to facet
osteoarthritis. Possible septic arthritis not entirely excluded. No
other convincing evidence for osteomyelitis discitis elsewhere
within the lumbar spine.

Conus medullaris and cauda equina: Conus extends to the L1 level.
Conus and cauda equina appear normal.

Paraspinal and other soft tissues: Diffuse edema present within the
lower posterior paraspinous soft tissues, adjacent to the L4-5 and
L5-S1 facets, greater on the left. No soft tissue collections.

Disc levels:

T12-L1: Small right paracentral disc protrusion flattens the right
ventral thecal sac. Mild right-sided facet hypertrophy. No spinal
stenosis. Foramina remain patent.

L1-2:  Unremarkable.

L2-3:  Unremarkable.

L3-4:  Unremarkable.

L4-5: Small central disc protrusion with caudad angulation minimally
indents the ventral thecal sac. Mild to moderate facet hypertrophy
with associated reactive marrow edema. No significant spinal
stenosis. Foramina remain patent.

L5-S1: Normal interspace. Mild to moderate right worse than left
facet hypertrophy. No significant spinal stenosis. Foramina remain
patent.
IMPRESSION: 1. Mild reactive marrow edema about the L4-5 facets bilaterally.
While these findings could be secondary to facet osteoarthritis,
possible septic arthritis could be considered in this clinical
setting. Associated soft tissue edema within the lower posterior
paraspinous soft tissues. No soft tissue collections.
2. Small disc protrusions at L4-5 and T12-L1 without stenosis.

## 2021-10-09 ENCOUNTER — Inpatient Hospital Stay: Admission: AD | Admit: 2021-10-09 | Payer: Medicaid Other | Admitting: Internal Medicine

## 2021-10-09 ENCOUNTER — Ambulatory Visit (HOSPITAL_BASED_OUTPATIENT_CLINIC_OR_DEPARTMENT_OTHER)
Admission: RE | Admit: 2021-10-09 | Discharge: 2021-10-09 | Disposition: A | Discharge status: Home / Self Care | Payer: Medicaid Other | Source: Ambulatory Visit | Attending: Internal Medicine | Admitting: Internal Medicine

## 2021-10-09 DIAGNOSIS — I079 Rheumatic tricuspid valve disease, unspecified: Secondary | ICD-10-CM | POA: Diagnosis not present

## 2021-10-09 DIAGNOSIS — D649 Anemia, unspecified: Secondary | ICD-10-CM | POA: Diagnosis not present

## 2021-10-09 DIAGNOSIS — R059 Cough, unspecified: Secondary | ICD-10-CM | POA: Diagnosis not present

## 2021-10-09 DIAGNOSIS — I2782 Chronic pulmonary embolism: Secondary | ICD-10-CM | POA: Diagnosis not present

## 2021-10-09 DIAGNOSIS — D696 Thrombocytopenia, unspecified: Secondary | ICD-10-CM | POA: Diagnosis not present

## 2021-10-09 DIAGNOSIS — I330 Acute and subacute infective endocarditis: Secondary | ICD-10-CM | POA: Diagnosis not present

## 2021-10-09 DIAGNOSIS — J189 Pneumonia, unspecified organism: Secondary | ICD-10-CM | POA: Diagnosis not present

## 2021-10-09 DIAGNOSIS — I38 Endocarditis, valve unspecified: Secondary | ICD-10-CM | POA: Diagnosis not present

## 2021-10-09 DIAGNOSIS — Z8614 Personal history of Methicillin resistant Staphylococcus aureus infection: Secondary | ICD-10-CM | POA: Diagnosis not present

## 2021-10-09 DIAGNOSIS — I2602 Saddle embolus of pulmonary artery with acute cor pulmonale: Secondary | ICD-10-CM | POA: Diagnosis not present

## 2021-10-09 DIAGNOSIS — R079 Chest pain, unspecified: Secondary | ICD-10-CM | POA: Diagnosis not present

## 2021-10-09 DIAGNOSIS — I272 Pulmonary hypertension, unspecified: Secondary | ICD-10-CM

## 2021-10-09 DIAGNOSIS — F1911 Other psychoactive substance abuse, in remission: Secondary | ICD-10-CM | POA: Diagnosis not present

## 2021-10-09 LAB — ECHOCARDIOGRAM COMPLETE
Area-P 1/2: 5.42 cm2
Calc EF: 44.6 %
S' Lateral: 3.3 cm
Single Plane A2C EF: 41.9 %
Single Plane A4C EF: 42.9 %

## 2021-10-09 NOTE — Progress Notes (Signed)
  Echocardiogram 2D Echocardiogram has been performed.  Latoya [name] 10/09/2021, 9:06 AM

## 2021-10-11 ENCOUNTER — Inpatient Hospital Stay (HOSPITAL_COMMUNITY)
Admission: AD | Admit: 2021-10-11 | Discharge: 2021-11-26 | DRG: 871 | Disposition: A | Discharge status: Home / Self Care | Payer: Medicaid Other | Source: Other Acute Inpatient Hospital | Attending: Internal Medicine | Admitting: Internal Medicine

## 2021-10-11 ENCOUNTER — Encounter (HOSPITAL_COMMUNITY): Payer: Self-pay | Admitting: Internal Medicine

## 2021-10-11 ENCOUNTER — Ambulatory Visit: Payer: Medicaid Other | Admitting: Internal Medicine

## 2021-10-11 ENCOUNTER — Other Ambulatory Visit: Payer: Self-pay

## 2021-10-11 DIAGNOSIS — B377 Candidal sepsis: Secondary | ICD-10-CM | POA: Diagnosis not present

## 2021-10-11 DIAGNOSIS — R0781 Pleurodynia: Secondary | ICD-10-CM

## 2021-10-11 DIAGNOSIS — Z419 Encounter for procedure for purposes other than remedying health state, unspecified: Secondary | ICD-10-CM | POA: Diagnosis not present

## 2021-10-11 DIAGNOSIS — T82514A Breakdown (mechanical) of infusion catheter, initial encounter: Secondary | ICD-10-CM | POA: Diagnosis not present

## 2021-10-11 DIAGNOSIS — F17210 Nicotine dependence, cigarettes, uncomplicated: Secondary | ICD-10-CM | POA: Diagnosis present

## 2021-10-11 DIAGNOSIS — J189 Pneumonia, unspecified organism: Secondary | ICD-10-CM | POA: Diagnosis not present

## 2021-10-11 DIAGNOSIS — Z6832 Body mass index (BMI) 32.0-32.9, adult: Secondary | ICD-10-CM | POA: Diagnosis not present

## 2021-10-11 DIAGNOSIS — Z8739 Personal history of other diseases of the musculoskeletal system and connective tissue: Secondary | ICD-10-CM | POA: Diagnosis not present

## 2021-10-11 DIAGNOSIS — E876 Hypokalemia: Secondary | ICD-10-CM | POA: Diagnosis not present

## 2021-10-11 DIAGNOSIS — I959 Hypotension, unspecified: Secondary | ICD-10-CM | POA: Diagnosis not present

## 2021-10-11 DIAGNOSIS — I330 Acute and subacute infective endocarditis: Secondary | ICD-10-CM | POA: Diagnosis present

## 2021-10-11 DIAGNOSIS — R9389 Abnormal findings on diagnostic imaging of other specified body structures: Secondary | ICD-10-CM

## 2021-10-11 DIAGNOSIS — M4626 Osteomyelitis of vertebra, lumbar region: Secondary | ICD-10-CM | POA: Diagnosis present

## 2021-10-11 DIAGNOSIS — I2601 Septic pulmonary embolism with acute cor pulmonale: Secondary | ICD-10-CM | POA: Diagnosis not present

## 2021-10-11 DIAGNOSIS — Y712 Prosthetic and other implants, materials and accessory cardiovascular devices associated with adverse incidents: Secondary | ICD-10-CM | POA: Diagnosis not present

## 2021-10-11 DIAGNOSIS — I2602 Saddle embolus of pulmonary artery with acute cor pulmonale: Secondary | ICD-10-CM | POA: Diagnosis not present

## 2021-10-11 DIAGNOSIS — E871 Hypo-osmolality and hyponatremia: Secondary | ICD-10-CM

## 2021-10-11 DIAGNOSIS — R0902 Hypoxemia: Secondary | ICD-10-CM | POA: Diagnosis present

## 2021-10-11 DIAGNOSIS — B9562 Methicillin resistant Staphylococcus aureus infection as the cause of diseases classified elsewhere: Secondary | ICD-10-CM | POA: Diagnosis not present

## 2021-10-11 DIAGNOSIS — Z79899 Other long term (current) drug therapy: Secondary | ICD-10-CM

## 2021-10-11 DIAGNOSIS — D509 Iron deficiency anemia, unspecified: Secondary | ICD-10-CM | POA: Diagnosis present

## 2021-10-11 DIAGNOSIS — I361 Nonrheumatic tricuspid (valve) insufficiency: Secondary | ICD-10-CM | POA: Diagnosis present

## 2021-10-11 DIAGNOSIS — G894 Chronic pain syndrome: Secondary | ICD-10-CM | POA: Diagnosis not present

## 2021-10-11 DIAGNOSIS — M4656 Other infective spondylopathies, lumbar region: Secondary | ICD-10-CM | POA: Diagnosis present

## 2021-10-11 DIAGNOSIS — D696 Thrombocytopenia, unspecified: Secondary | ICD-10-CM | POA: Diagnosis not present

## 2021-10-11 DIAGNOSIS — I079 Rheumatic tricuspid valve disease, unspecified: Secondary | ICD-10-CM | POA: Diagnosis not present

## 2021-10-11 DIAGNOSIS — Z8701 Personal history of pneumonia (recurrent): Secondary | ICD-10-CM

## 2021-10-11 DIAGNOSIS — D6959 Other secondary thrombocytopenia: Secondary | ICD-10-CM | POA: Diagnosis present

## 2021-10-11 DIAGNOSIS — F1910 Other psychoactive substance abuse, uncomplicated: Secondary | ICD-10-CM | POA: Diagnosis not present

## 2021-10-11 DIAGNOSIS — F1121 Opioid dependence, in remission: Secondary | ICD-10-CM | POA: Diagnosis present

## 2021-10-11 DIAGNOSIS — M4646 Discitis, unspecified, lumbar region: Secondary | ICD-10-CM | POA: Diagnosis not present

## 2021-10-11 DIAGNOSIS — I2699 Other pulmonary embolism without acute cor pulmonale: Secondary | ICD-10-CM | POA: Diagnosis not present

## 2021-10-11 DIAGNOSIS — K219 Gastro-esophageal reflux disease without esophagitis: Secondary | ICD-10-CM | POA: Diagnosis present

## 2021-10-11 DIAGNOSIS — R7881 Bacteremia: Secondary | ICD-10-CM | POA: Diagnosis not present

## 2021-10-11 DIAGNOSIS — D638 Anemia in other chronic diseases classified elsewhere: Secondary | ICD-10-CM | POA: Diagnosis not present

## 2021-10-11 DIAGNOSIS — B49 Unspecified mycosis: Secondary | ICD-10-CM | POA: Diagnosis not present

## 2021-10-11 DIAGNOSIS — E669 Obesity, unspecified: Secondary | ICD-10-CM | POA: Diagnosis present

## 2021-10-11 DIAGNOSIS — I5189 Other ill-defined heart diseases: Secondary | ICD-10-CM | POA: Diagnosis present

## 2021-10-11 DIAGNOSIS — I38 Endocarditis, valve unspecified: Secondary | ICD-10-CM | POA: Diagnosis not present

## 2021-10-11 DIAGNOSIS — Z23 Encounter for immunization: Secondary | ICD-10-CM

## 2021-10-11 DIAGNOSIS — R519 Headache, unspecified: Secondary | ICD-10-CM | POA: Diagnosis not present

## 2021-10-11 DIAGNOSIS — Z87898 Personal history of other specified conditions: Secondary | ICD-10-CM | POA: Diagnosis not present

## 2021-10-11 DIAGNOSIS — I272 Pulmonary hypertension, unspecified: Secondary | ICD-10-CM | POA: Diagnosis present

## 2021-10-11 DIAGNOSIS — I2692 Saddle embolus of pulmonary artery without acute cor pulmonale: Secondary | ICD-10-CM | POA: Diagnosis not present

## 2021-10-11 DIAGNOSIS — Q2112 Patent foramen ovale: Secondary | ICD-10-CM | POA: Diagnosis not present

## 2021-10-11 DIAGNOSIS — I10 Essential (primary) hypertension: Secondary | ICD-10-CM | POA: Diagnosis present

## 2021-10-11 DIAGNOSIS — G8921 Chronic pain due to trauma: Secondary | ICD-10-CM | POA: Diagnosis not present

## 2021-10-11 DIAGNOSIS — Z888 Allergy status to other drugs, medicaments and biological substances status: Secondary | ICD-10-CM

## 2021-10-11 DIAGNOSIS — Z7901 Long term (current) use of anticoagulants: Secondary | ICD-10-CM

## 2021-10-11 DIAGNOSIS — R079 Chest pain, unspecified: Secondary | ICD-10-CM | POA: Diagnosis not present

## 2021-10-11 DIAGNOSIS — G8929 Other chronic pain: Secondary | ICD-10-CM | POA: Diagnosis not present

## 2021-10-11 HISTORY — DX: Saddle embolus of pulmonary artery without acute cor pulmonale: I26.92

## 2021-10-11 HISTORY — DX: Thrombocytopenia, unspecified: D69.6

## 2021-10-11 HISTORY — DX: Pneumonia, unspecified organism: J18.9

## 2021-10-11 LAB — C-REACTIVE PROTEIN: CRP: 13.3 mg/dL — ABNORMAL HIGH (ref <1.0)

## 2021-10-11 LAB — CBC WITH DIFFERENTIAL/PLATELET
Abs Immature Granulocytes: 0.11 10*3/uL — ABNORMAL HIGH (ref 0.00–0.07)
Basophils Absolute: 0.1 10*3/uL (ref 0.0–0.1)
Basophils Relative: 0 %
Eosinophils Absolute: 0.2 10*3/uL (ref 0.0–0.5)
Eosinophils Relative: 1 %
HCT: 28.7 % — ABNORMAL LOW (ref 36.0–46.0)
Hemoglobin: 8.6 g/dL — ABNORMAL LOW (ref 12.0–15.0)
Immature Granulocytes: 1 %
Lymphocytes Relative: 20 %
Lymphs Abs: 3.1 10*3/uL (ref 0.7–4.0)
MCH: 23.6 pg — ABNORMAL LOW (ref 26.0–34.0)
MCHC: 30 g/dL (ref 30.0–36.0)
MCV: 78.6 fL — ABNORMAL LOW (ref 80.0–100.0)
Monocytes Absolute: 1.8 10*3/uL — ABNORMAL HIGH (ref 0.1–1.0)
Monocytes Relative: 11 %
Neutro Abs: 10.8 10*3/uL — ABNORMAL HIGH (ref 1.7–7.7)
Neutrophils Relative %: 67 %
Platelets: 120 10*3/uL — ABNORMAL LOW (ref 150–400)
RBC: 3.65 MIL/uL — ABNORMAL LOW (ref 3.87–5.11)
RDW: 19.8 % — ABNORMAL HIGH (ref 11.5–15.5)
WBC: 16.1 10*3/uL — ABNORMAL HIGH (ref 4.0–10.5)
nRBC: 0 % (ref 0.0–0.2)

## 2021-10-11 LAB — HEPARIN LEVEL (UNFRACTIONATED)
Heparin Unfractionated: 0.22 IU/mL — ABNORMAL LOW (ref 0.30–0.70)
Heparin Unfractionated: 0.1 IU/mL — ABNORMAL LOW (ref 0.30–0.70)
Heparin Unfractionated: 0.11 IU/mL — ABNORMAL LOW (ref 0.30–0.70)

## 2021-10-11 LAB — RAPID URINE DRUG SCREEN, HOSP PERFORMED
Amphetamines: NOT DETECTED
Barbiturates: NOT DETECTED
Benzodiazepines: NOT DETECTED
Cocaine: NOT DETECTED
Opiates: POSITIVE — AB
Tetrahydrocannabinol: NOT DETECTED

## 2021-10-11 LAB — MAGNESIUM: Magnesium: 1.9 mg/dL (ref 1.7–2.4)

## 2021-10-11 LAB — COMPREHENSIVE METABOLIC PANEL
ALT: 9 U/L (ref 0–44)
AST: 9 U/L — ABNORMAL LOW (ref 15–41)
Albumin: 2.8 g/dL — ABNORMAL LOW (ref 3.5–5.0)
Alkaline Phosphatase: 45 U/L (ref 38–126)
Anion gap: 9 (ref 5–15)
BUN: 7 mg/dL (ref 6–20)
CO2: 23 mmol/L (ref 22–32)
Calcium: 8.4 mg/dL — ABNORMAL LOW (ref 8.9–10.3)
Chloride: 102 mmol/L (ref 98–111)
Creatinine, Ser: 0.7 mg/dL (ref 0.44–1.00)
GFR, Estimated: >60 mL/min (ref >60)
Glucose, Bld: 89 mg/dL (ref 70–99)
Potassium: 3.7 mmol/L (ref 3.5–5.1)
Sodium: 134 mmol/L — ABNORMAL LOW (ref 135–145)
Total Bilirubin: 1 mg/dL (ref 0.3–1.2)
Total Protein: 7 g/dL (ref 6.5–8.1)

## 2021-10-11 LAB — BRAIN NATRIURETIC PEPTIDE: B Natriuretic Peptide: 100.4 pg/mL — ABNORMAL HIGH (ref 0.0–100.0)

## 2021-10-11 LAB — PROTIME-INR
INR: 1.3 — ABNORMAL HIGH (ref 0.8–1.2)
Prothrombin Time: 16.4 seconds — ABNORMAL HIGH (ref 11.4–15.2)

## 2021-10-11 LAB — APTT: aPTT: 50 seconds — ABNORMAL HIGH (ref 24–36)

## 2021-10-11 LAB — TROPONIN I (HIGH SENSITIVITY): Troponin I (High Sensitivity): 7 ng/L (ref <18)

## 2021-10-11 LAB — LACTIC ACID, PLASMA: Lactic Acid, Venous: 1.8 mmol/L (ref 0.5–1.9)

## 2021-10-11 LAB — MRSA NEXT GEN BY PCR, NASAL: MRSA by PCR Next Gen: NOT DETECTED

## 2021-10-11 MED ORDER — GABAPENTIN 300 MG PO CAPS
300.0000 mg | ORAL_CAPSULE | Freq: Three times a day (TID) | ORAL | Status: DC
  Administered 2021-10-11 – 2021-10-22 (×34): 300 mg via ORAL
  Filled 2021-10-11 (×34): qty 1

## 2021-10-11 MED ORDER — HEPARIN (PORCINE) 25000 UT/250ML-% IV SOLN
3100.0000 [IU]/h | INTRAVENOUS | Status: AC
  Administered 2021-10-11: 1400 [IU]/h via INTRAVENOUS
  Administered 2021-10-11: 1850 [IU]/h via INTRAVENOUS
  Administered 2021-10-11: 1600 [IU]/h via INTRAVENOUS
  Administered 2021-10-12: 2300 [IU]/h via INTRAVENOUS
  Administered 2021-10-12: 2500 [IU]/h via INTRAVENOUS
  Administered 2021-10-13: 2800 [IU]/h via INTRAVENOUS
  Administered 2021-10-13: 2650 [IU]/h via INTRAVENOUS
  Administered 2021-10-14: 2800 [IU]/h via INTRAVENOUS
  Filled 2021-10-11 (×7): qty 250

## 2021-10-11 MED ORDER — FERROUS GLUCONATE 324 (38 FE) MG PO TABS
324.0000 mg | ORAL_TABLET | Freq: Every day | ORAL | Status: DC
  Administered 2021-10-11 – 2021-11-26 (×47): 324 mg via ORAL
  Filled 2021-10-11 (×48): qty 1

## 2021-10-11 MED ORDER — PANTOPRAZOLE SODIUM 40 MG PO TBEC
40.0000 mg | DELAYED_RELEASE_TABLET | Freq: Every day | ORAL | Status: DC
  Administered 2021-10-11 – 2021-11-26 (×47): 40 mg via ORAL
  Filled 2021-10-11 (×47): qty 1

## 2021-10-11 MED ORDER — MENTHOL 3 MG MT LOZG
1.0000 | LOZENGE | OROMUCOSAL | Status: DC | PRN
  Administered 2021-10-13 – 2021-11-01 (×6): 3 mg via ORAL
  Filled 2021-10-11 (×10): qty 9

## 2021-10-11 MED ORDER — VANCOMYCIN HCL 1250 MG/250ML IV SOLN
1250.0000 mg | Freq: Two times a day (BID) | INTRAVENOUS | Status: DC
  Administered 2021-10-11 – 2021-10-13 (×4): 1250 mg via INTRAVENOUS
  Filled 2021-10-11 (×5): qty 250

## 2021-10-11 MED ORDER — LINEZOLID 600 MG PO TABS
600.0000 mg | ORAL_TABLET | Freq: Two times a day (BID) | ORAL | Status: DC
  Filled 2021-10-11: qty 1

## 2021-10-11 MED ORDER — MORPHINE SULFATE (PF) 4 MG/ML IV SOLN
4.0000 mg | INTRAVENOUS | Status: DC | PRN
  Administered 2021-10-11 – 2021-10-12 (×9): 4 mg via INTRAVENOUS
  Filled 2021-10-11 (×9): qty 1

## 2021-10-11 MED ORDER — HEPARIN BOLUS VIA INFUSION
2000.0000 [IU] | Freq: Once | INTRAVENOUS | Status: AC
  Administered 2021-10-11: 2000 [IU] via INTRAVENOUS
  Filled 2021-10-11: qty 2000

## 2021-10-11 MED ORDER — ACETAMINOPHEN 650 MG RE SUPP: 650.0000 mg | Freq: Four times a day (QID) | RECTAL | Status: DC | PRN

## 2021-10-11 MED ORDER — SODIUM CHLORIDE 0.9 % IV SOLN
2.0000 g | Freq: Three times a day (TID) | INTRAVENOUS | Status: DC
  Administered 2021-10-11 – 2021-10-16 (×15): 2 g via INTRAVENOUS
  Filled 2021-10-11 (×16): qty 12.5

## 2021-10-11 MED ORDER — POLYETHYLENE GLYCOL 3350 17 G PO PACK: 17.0000 g | PACK | Freq: Every day | ORAL | Status: DC | PRN

## 2021-10-11 MED ORDER — VANCOMYCIN HCL 1750 MG/350ML IV SOLN
1750.0000 mg | Freq: Once | INTRAVENOUS | Status: AC
  Administered 2021-10-11: 1750 mg via INTRAVENOUS
  Filled 2021-10-11: qty 350

## 2021-10-11 MED ORDER — ACETAMINOPHEN 325 MG PO TABS
650.0000 mg | ORAL_TABLET | Freq: Four times a day (QID) | ORAL | Status: DC | PRN
  Administered 2021-10-11 – 2021-10-13 (×6): 650 mg via ORAL
  Administered 2021-10-22: 325 mg via ORAL
  Administered 2021-10-25 – 2021-11-24 (×13): 650 mg via ORAL
  Filled 2021-10-11 (×21): qty 2

## 2021-10-11 MED ORDER — PREGABALIN 75 MG PO CAPS
75.0000 mg | ORAL_CAPSULE | Freq: Three times a day (TID) | ORAL | Status: DC
  Administered 2021-10-11 – 2021-10-21 (×31): 75 mg via ORAL
  Filled 2021-10-11 (×31): qty 1

## 2021-10-11 MED ORDER — ONDANSETRON HCL 4 MG/2ML IJ SOLN
4.0000 mg | Freq: Four times a day (QID) | INTRAMUSCULAR | Status: DC | PRN
  Administered 2021-10-25 – 2021-11-21 (×18): 4 mg via INTRAVENOUS
  Filled 2021-10-11 (×18): qty 2

## 2021-10-11 MED ORDER — ONDANSETRON HCL 4 MG PO TABS
4.0000 mg | ORAL_TABLET | Freq: Four times a day (QID) | ORAL | Status: DC | PRN
  Administered 2021-11-02 – 2021-11-08 (×3): 4 mg via ORAL
  Filled 2021-10-11 (×3): qty 1

## 2021-10-11 MED ORDER — OXYCODONE-ACETAMINOPHEN 5-325 MG PO TABS
1.0000 | ORAL_TABLET | ORAL | Status: DC | PRN
  Administered 2021-10-12: 1 via ORAL
  Filled 2021-10-11: qty 1

## 2021-10-11 NOTE — Assessment & Plan Note (Addendum)
Previously diagnosed. Initial plan for angio vac which was canceled secondary to PFO. Patient was treated for this as an inpatient and discharged on linezolid. Vancomycin and Cefepime restarted on admission. ID consulted. 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  In addition, MRI lumbar spine with evidence of discitis. - fever overnight, trend  - ua not convincing of UTI, CXR with bilateral streaky/confluent opacities - leukocytosis fluctuating -ID recommendations: continue vancomycin and micafungin (fluconazole/linezolid while awaiting access).  She's completed cefepime for possible pneumonia. -TCTS consulted on 6/8 for recommendations on persistent large TV vegetation. Plan to consider occlusion of her PFO for consideration of angio VAC debridement (Dr. Cliffton Asters to follow up 6/20)

## 2021-10-11 NOTE — Progress Notes (Signed)
IR was requested for image guided aspiration/biopsy for possible discitis of L4-5.   Case was reviewed by Dr. Estanislado Pandy, L4-5 facets edema is minimal with no significant edema of L4-5 disc, there is no target for disc aspiration/biopsy.   Recommends repeat MRI in 4-6 weeks if symptoms persists.    Ordering provider notified.   Will delete the Ir rad eval order.  Please call NIR for questions and concerns.   Armando Gang Auri Jahnke PA-C 10/11/2021 9:50 AM

## 2021-10-11 NOTE — Assessment & Plan Note (Addendum)
Patient with Latoya [name] history of septic emboli on prior admission. Patient noted to have evidence of LLL pneumonia. Started on antibiotics as mentioned in problem, Endocarditis of tricuspid valve. Cefepime completed per ID

## 2021-10-11 NOTE — Progress Notes (Signed)
   10/11/21 2008  Assess: MEWS Score  Temp (!) 103.1 F (39.5 C)  BP 118/72  MAP (mmHg) 87  Pulse Rate (!) 115  ECG Heart Rate (!) 118  Resp 20  SpO2 100 %  O2 Device Room Air  Assess: MEWS Score  MEWS Temp 2  MEWS Systolic 0  MEWS Pulse 2  MEWS RR 0  MEWS LOC 0  MEWS Score 4  MEWS Score Color Red  Assess: if the MEWS score is Yellow or Red  Were vital signs taken at a resting state? Yes  Focused Assessment No change from prior assessment  Does the patient meet 2 or more of the SIRS criteria? Yes  Does the patient have a confirmed or suspected source of infection? Yes  Provider and Rapid Response Notified? Yes  MEWS guidelines implemented *See Row Information* Yes  Treat  Pain Intervention(s) MD notified (Comment)  Take Vital Signs  Increase Vital Sign Frequency  Red: Q 1hr X 4 then Q 4hr X 4, if remains red, continue Q 4hrs  Escalate  MEWS: Escalate Red: discuss with charge nurse/RN and provider, consider discussing with RRT  Notify: Charge Nurse/RN  Name of Charge Nurse/RN Notified Arelia Sneddon  Date Charge Nurse/RN Notified 10/11/21  Time Charge Nurse/RN Notified 2008  Notify: Provider  Provider Name/Title Jennette Kettle  Date Provider Notified 10/11/21  Time Provider Notified 2020  Method of Notification Page  Notification Reason Other (Comment) (febrile and tachycardic)  Provider response No new orders  Date of Provider Response 10/11/21  Time of Provider Response 2025  Document  Patient Outcome Not stable and remains on department  Assess: SIRS CRITERIA  SIRS Temperature  1  SIRS Pulse 1  SIRS Respirations  0  SIRS WBC 0  SIRS Score Sum  2

## 2021-10-11 NOTE — Consult Note (Signed)
[place] [facility]    Date of Admission:  10/11/2021     Total days of antibiotics                Reason for Consult: Septic Arthritis   Referring Provider: Dr. Lonny Prude Primary Care Provider: Pcp, No   ASSESSMENT:  Latoya [name] is a 36 y/o female with recent treatment for disseminated MRSA infection with bacteremia, septic emboli and tricuspid valve endocarditis admitted with new saddle pulmonary emboli and new septic arthritis of the lumbar spine. Certainly with continued fevers there is concern for continued MRSA infection compounded with new pulmonary embolism resulting in her current symptoms. Will continue with vancomycin and cefepime. Therapeutic drug monitoring of renal function and vancomycin levels. Monitor blood cultures for presence of bacteremia although she was on linezolid. On heparin for pulmonary embolism. Remaining medical and supportive care per primary team.   PLAN:  Continue current dose of vancomycin and cefepime.  Monitor blood cultures for bacteremia.  Heparin for saddle pulmonary emboli.  Therapeutic drug monitoring of renal function and vancomycin levels.  Remaining medical and supportive care per primary team.    Principal Problem:   Acute saddle pulmonary embolism (HCC) Active Problems:   Endocarditis of tricuspid valve   IV drug abuse (HCC)   Lumbar discitis   MRSA bacteremia   Thrombocytopenia (HCC)   Pneumonia of left lower lobe due to infectious organism    ferrous gluconate  324 mg Oral Q breakfast   gabapentin  300 mg Oral TID   pantoprazole  40 mg Oral Daily   pregabalin  75 mg Oral TID     HPI: Latoya [name] is a 36 y.o. female with previous medical history of IVDU complicated by disseminated MRSA infection with bacteremia and tricuspid valve endocarditis, and septic emboli transferred from [facility] for saddle pulmonary emboli.   Latoya [name] was previously hospitalized from 07/23/21-09/17/21 for disseminated  MRSA infection with bacteremia, tricuspid valve endocarditis s/p 6 week course of vancomycin. Seen by Dr. Gale Journey for follow up on 09/26/21 with continued coughing spells and pain in her lower back and along the left side with nightly fevers. Continued on linezolid and started on Eliquis for pulmonary embolism. MRI of the lumbar spine 10/07/21 with concern for possible septic arthritis. About 24-48 hours prior to admission had worsening chest and back pain and presented to [facility] ED with CT angiogram showing new large saddle pulmonary emboli separate from previous septic emboli which were improved. Echocardiogram confirmed decreased right ventricular function.   Latoya [name] has a max temperature of 100.5 F in the last 24 hours with WBC count of 16.1. Blood cultures drawn on admission without growth in <12 hours. Currently on vancomycin and cefepime with additional concern for pneumonia. Continues to have chest pain, shortness of breath and cough.   Review of Systems: Review of Systems  Constitutional:  Positive for chills, fever and malaise/fatigue. Negative for weight loss.  Respiratory:  Positive for cough and shortness of breath. Negative for wheezing.   Cardiovascular:  Positive for chest pain. Negative for leg swelling.  Gastrointestinal:  Negative for abdominal pain, constipation, diarrhea, nausea and vomiting.  Skin:  Negative for rash.    Past Medical History:  Diagnosis Date   Heroin addiction ([place])    IV drug abuse ([place])     Social History   Tobacco Use   Smoking status: Light Smoker    Types: Cigarettes   Smokeless tobacco: Never  Vaping Use   Vaping Use: Never used  Substance Use Topics   Alcohol use: Yes   Drug use: Not Currently    Types: Heroin    Comment: Heroin     Family History  Problem Relation Age of Onset   Healthy Mother     Allergies  Allergen Reactions   Naltrexone Anxiety    Severe anxiety Restlessness  Vomiting     OBJECTIVE: Blood pressure  123/75, pulse (!) 111, temperature 99.1 F (37.3 C), temperature source Oral, resp. rate 18, height 5\' 6"  (1.676 m), weight 82.6 kg, SpO2 100 %.  Physical Exam Constitutional:      General: She is not in acute distress.    Appearance: She is well-developed.     Comments: Lying in bed with head of bed elevated; pleasant; very warm to the touch  Cardiovascular:     Rate and Rhythm: Regular rhythm. Tachycardia present.     Heart sounds: Normal heart sounds.  Pulmonary:     Effort: Pulmonary effort is normal.     Breath sounds: Normal breath sounds.  Skin:    General: Skin is warm and dry.  Neurological:     Mental Status: She is alert and oriented to person, place, and time.  Psychiatric:        Mood and Affect: Mood normal.    Lab Results Lab Results  Component Value Date   WBC 16.1 (H) 10/11/2021   HGB 8.6 (L) 10/11/2021   HCT 28.7 (L) 10/11/2021   MCV 78.6 (L) 10/11/2021   PLT 120 (L) 10/11/2021    Lab Results  Component Value Date   CREATININE 0.70 10/11/2021   BUN 7 10/11/2021   NA 134 (L) 10/11/2021   K 3.7 10/11/2021   CL 102 10/11/2021   CO2 23 10/11/2021    Lab Results  Component Value Date   ALT 9 10/11/2021   AST 9 (L) 10/11/2021   ALKPHOS 45 10/11/2021   BILITOT 1.0 10/11/2021     Microbiology: Recent Results (from the past 240 hour(s))  MRSA Next Gen by PCR, Nasal     Status: None   Collection Time: 10/11/21  9:58 AM   Specimen: Nasal Mucosa; Nasal Swab  Result Value Ref Range Status   MRSA by PCR Next Gen NOT DETECTED NOT DETECTED Final    Comment: (NOTE) The GeneXpert MRSA Assay (FDA approved for NASAL specimens only), is one component of a comprehensive MRSA colonization surveillance program. It is not intended to diagnose MRSA infection nor to guide or monitor treatment for MRSA infections. Test performance is not FDA approved in patients less than 94 years old. Performed at [facility], [place] [address] [place], [place] [address]       Terri Piedra, [place] [facility]  10/11/2021  1:39 PM

## 2021-10-11 NOTE — H&P (Signed)
History and Physical    Patient: Latoya [name] MRN: [id number] [place]: 10/11/2021  Date of Service: the patient was seen and examined on 10/11/2021  Patient coming from: [place] ED  Chief Complaint:   Chest and Back Pain  HPI:   36 year old female past medical history of intravenous drug abuse (now reports abstinence),  MSSA tricuspid valve endocarditis in 2020 (Treated with angiovac and course of Cefazolin), MRSA L4-L5 facet join septic discitis in 07/2019 and more recently diagnosis of MRSA bacteremia with infective endocarditis and tricuspid valve vegetation diagnosed 07/2021 presenting as a transfer from [facility] for saddle pulmonary emboli.  Of note, patient recently underwent a prolonged hospitalization at [facility] from 3/29 until 5/14.  Patient originally presented to [facility] with low back pain cough and fever with work-up revealing blood cultures positive for MRSA bacteremia with tricuspid valve vegetation noted on echocardiogram and additionally identified evidence of bilateral septic pulmonary emboli with right heart strain.  Patient was transferred to [facility] on 4/7 for CT surgery intervention with angio Vac procedure but the discovery of significant PFO led to this procedure being aborted.  Patient remained hospitalized for a 6-week course of inpatient antibiotics due to ongoing intravenous drug abuse.  Vancomycin infusions completed on 5/13 with discharge on 5/14.  Patient has since followed up with infectious disease in clinic on 5/23.  On 5/23, patient began to report of new leg pain chest pain, back pain and cough with complaints of intermittent fevers.  Initially patient was felt to be suffering from persisting MRSA endocarditis and was initiated on linezolid.  Patient transiently started improving and on the 5/30 infectious disease clinic visit CT imaging of the chest as well as MRI of the lumbar spine were ordered.    MRI of the lumbar spine without contrast  was obtained on 6/3 due to complaints of back pain revealing possible septic arthritis of L4-L5.  Patient explains that approximately 24 to 48 hours ago she began to develop worsening chest and back pain.  Patient describes this pain as sharp in quality, severe in intensity, worse with movement deep inspiration or cough.  This has been associated with sudden onset of worsening shortness of breath, worse with exertion and improved with rest.  Patient also complains of some associated mild bilateral lower extremity swelling.  Patient then presented to [facility] emergency department with these complaints where a CT angiogram of the chest revealed a new large saddle pulmonary embolus separate from the known septic pulmonary emboli which actually look somewhat improved.  CT angiogram of the chest revealed right heart strain.  Echocardiogram performed during that emergency department stay confirmed decreased right ventricular function and substantial pulmonary hypertension.  Case was discussed with Dr. Loanne Drilling at the time with PCCM who stated it would be okay for the patient to be admitted to the hospital service here in the progressive unit with PCCM to consult.  The patient has therefore been accepted for transfer to [facility] for continued medical care on a heparin infusion.    Review of Systems: Review of Systems  Constitutional:  Positive for chills and fever.  Respiratory:  Positive for cough.   Cardiovascular:  Positive for chest pain.  Musculoskeletal:  Positive for back pain.  Neurological:  Positive for weakness.  All other systems reviewed and are negative.   Past Medical History:  Diagnosis Date   Heroin addiction ([place])    IV drug abuse [facility])     Past Surgical  History:  Procedure Laterality Date   APPLICATION OF ANGIOVAC N/A 08/11/2021   Procedure: ABORTED APPLICATION OF ANGIOVAC;  Surgeon: Lajuana Matte, MD;  Location: [place];  Service: Vascular;  Laterality:  N/A;   IR LUMBAR [place] W/IMG GUIDE  07/29/2019   TEE WITHOUT CARDIOVERSION N/A 08/08/2021   Procedure: TRANSESOPHAGEAL ECHOCARDIOGRAM (TEE);  Surgeon: Josue Hector, MD;  Location: [facility] ENDOSCOPY;  Service: Cardiovascular;  Laterality: N/A;   TEE WITHOUT CARDIOVERSION N/A 08/11/2021   Procedure: TRANSESOPHAGEAL ECHOCARDIOGRAM (TEE);  Surgeon: Lajuana Matte, MD;  Location: [place];  Service: Open Heart Surgery;  Laterality: N/A;    Social History:  reports that she has been smoking cigarettes. She has never used smokeless tobacco. She reports current alcohol use. She reports that she does not currently use drugs after having used the following drugs: Heroin.  Allergies  Allergen Reactions   Naltrexone Anxiety    Severe anxiety, restlessness, vomiting    Family History  Problem Relation Age of Onset   Healthy Mother     Prior to Admission medications   Medication Sig Start Date End Date Taking? Authorizing Provider  APIXABAN Arne Cleveland) VTE STARTER PACK (10MG  AND 5MG ) Take as directed on package: start with two-5mg  tablets twice daily for 7 days. On day 8, switch to one-5mg  tablet twice daily. 09/15/21   [place] Donnamarie Poag, DO  Docusate Sodium (DSS) 100 MG CAPS Take 1 capsule (100 mg total) by mouth 2 (two) times daily as needed for mild constipation. 09/15/21   [place] Donnamarie Poag, DO  ferrous gluconate (FERGON) 324 MG tablet Take 1 tablet (324 mg total) by mouth daily with breakfast. 09/15/21 10/15/21  [place] Donnamarie Poag, DO  gabapentin (NEURONTIN) 600 MG tablet Take 0.5 tablets (300 mg total) by mouth 3 (three) times daily. 09/15/21 10/15/21  [place] Donnamarie Poag, DO  linezolid (ZYVOX) 600 MG tablet Take 1 tablet (600 mg total) by mouth 2 (two) times daily. 09/26/21 10/26/21  Vu, Rockey Situ, MD  linezolid (ZYVOX) 600 MG tablet Take 1 tablet (600 mg total) by mouth 2 (two) times daily for 14 days. 10/03/21 10/17/21  Vu, Johnny Bridge T, MD  oxyCODONE (ROXICODONE) 5 MG immediate release tablet Take 1 tablet (5 mg  total) by mouth every 8 (eight) hours as needed. Patient not taking: Reported on 09/26/2021 09/15/21 09/15/22  [place] Eric J, DO  pantoprazole (PROTONIX) 40 MG tablet Take 1 tablet (40 mg total) by mouth daily. 09/15/21 10/15/21  [place] Donnamarie Poag, DO  pregabalin (LYRICA) 75 MG capsule Take 1 capsule (75 mg total) by mouth 3 (three) times daily. 09/15/21 10/15/21  [place] Eric J, DO    Physical Exam:  Vitals:   10/11/21 0400 10/11/21 0500 10/11/21 0600 10/11/21 0746  BP: 118/67 100/62 112/67 107/71  Pulse:    100  Resp:    18  Temp:    99.1 F (37.3 C)  TempSrc:    Oral  SpO2: (!) 88% 92% 92% 99%  Weight:      Height:        Constitutional: Awake alert and oriented x3, patient is in distress due to chest and back pain. Skin: no rashes, no lesions, good skin turgor noted. Eyes: Pupils are equally reactive to light.  No evidence of scleral icterus or conjunctival pallor.  ENMT: Moist mucous membranes noted.  Posterior pharynx clear of any exudate or lesions.   Neck: normal, supple, no masses, no thyromegaly.  No evidence of jugular venous distension.   Respiratory: Significant  left basilar and left mid field rales with additional mild rales in the right base.  Scattered rhonchi bilaterally.  No evidence of wheezing.  Normal respiratory effort. No accessory muscle use.  Cardiovascular: Tachycardic rate with regular rhythm.  Mild distal bilateral lower extremity pitting edema.  2+ pedal pulses. No carotid bruits.  Chest:   Nontender without crepitus or deformity.   Back:   Midline lumbar tenderness without crepitus or deformity. Abdomen: Mild generalized abdominal tenderness.  Abdomen is soft.  No evidence of intra-abdominal masses.  Positive bowel sounds noted in all quadrants.   Musculoskeletal: No joint deformity upper and lower extremities. Good ROM, no contractures. Normal muscle tone.  Neurologic: CN 2-12 grossly intact. Sensation intact.  Patient moving all 4 extremities spontaneously.   Patient is following all commands.  Patient is responsive to verbal stimuli.   Psychiatric: Patient exhibits normal mood with appropriate affect.  Patient seems to possess insight as to their current situation.    Data Reviewed:  I have personally reviewed and interpreted labs, imaging.  Significant findings are:  Coagulation profile revealing INR of 1.3, PT of 16.4.   CBC revealing white blood cell count of 16.1, hemoglobin 8.6, hematocrit 28.7, platelet count of 120.   Chemistry revealing sodium 134, potassium 3.7, BUN 7, creatinine 0.70, albumin 2.8.   Troponin 7.   BNP 100.4.   CRP 13.3.   Lactic acid 1.8. CT angiogram of the chest revealing multiple prior emboli in the left lower lobe pulmonary arteries.  New large saddle pulmonary embolus at the bifurcation of the right pulmonary artery consistent with recurrent pulmonary embolus positive for acute pulmonary embolism with CT evidence of right heart strain with RV to LV ratio of 1.49 consistent with at least submassive pulmonary embolism.  Additional patchy bilateral pulmonary infiltrates greater on the left with increased infiltrate seen in the superior segment of the left lower lobe.  EKG: Personally reviewed.  Rhythm is Normal sinus rhythm with heart rate of 98BPM.  No dynamic ST segment changes appreciated.    Assessment and Plan: * Acute saddle pulmonary embolism (HCC) CT angiogram reveals evidence of saddle pulmonary embolism which is a new development compared to patient's already known bilateral septic pulmonary emboli and prior nonseptic emboli in the left lower lobe Patient attests to Eliquis adherence therefore it seems that patient is developed this significant thromboembolic event despite Eliquis therapy Notable evidence of right heart strain on CT imaging with an RV/LV ratio of 1.49 Echocardiogram was additionally performed while patient was in the [facility] emergency department revealing new right ventricular pressure  and volume overload when compared with echocardiogram performed on 6/5 highly suggestive that this development is secondary to the saddle embolus.  Patient's been switched to intravenous heparin infusion Obtaining bilateral lower extremity ultrasound to evaluate for any clot burden in the leg Patient will likely need hematology involvement prior to discharge and determining what anticoagulant patient should be transitioned to prior to discharge. Monitoring patient on telemetry Supplemental oxygen for bouts of hypoxia As needed opiate-based analgesics for associated chest pain   Lumbar discitis Patient is been complaining of low back pain for the past 2 months This was evaluated with MRI of the lumbar spine in March on 3/29 which revealed no evidence of discitis at the time However due to continued complaints of low back pain Dr. Gale Journey with infectious disease ordered a repeat lumbar spine MRI on 6/3 which does reveal evidence of possible developing discitis. Reviewed this finding with Dr. Baxter Flattery  with infectious disease who recommends IR consultation for aspiration and culture.  Additionally discussed this finding with on-call neurosurgery and they agree with this approach Treating patient with intravenous vancomycin and cefepime per recommendation by Dr. Baxter Flattery with infectious disease which will cover all potential sources of infection including infective endocarditis, developing discitis as well as what appears to be a worsening left lower lobe infiltrate/possible pneumonia on CT angiogram of the chest done at [facility].  Endocarditis of tricuspid valve Patient has continued to exhibit fevers nearly every single evening since her discharge from [facility] on 5/14 Attempts to address this in the outpatient setting were made by Dr. Gale Journey by placing patient on linezolid but despite this patient continues to exhibit fevers every single evening Patient additionally continues to exhibit substantial  leukocytosis here as well as fever upon arrival to medical floor Patient likely continues to have some degree of infective endocarditis at the patient's tricuspid vegetation was never surgically intervened upon due to presence of a PFO.   Treating patient with intravenous cefepime and vancomycin, please see rationale documented above.    Pneumonia of left lower lobe due to infectious organism Patient on intravenous antibiotics as detailed above with cefepime and vancomycin Supplemental oxygen for hypoxia Attempting to keep patient euvolemic Blood cultures already obtained Infectious disease consulted as detailed above  MRSA bacteremia Please see assessment and plan above  IV drug abuse ([place]) Patient attends to continued abstinence, continue to encourage ongoing abstinence  Thrombocytopenia ([place]) Patient exhibiting thrombocytopenia likely secondary to ongoing infection No clinical evidence of bleeding at this time Continue to monitor platelet counts with serial CBCs       Code Status:  Full code  code status decision has been confirmed with: patient Family Communication: deferred   Consults:   Dr. Baxter Flattery with infectious disease PCCM Interventional radiology  Severity of Illness:  The appropriate patient status for this patient is INPATIENT. Inpatient status is judged to be reasonable and necessary in order to provide the required intensity of service to ensure the patient's safety. The patient's presenting symptoms, physical exam findings, and initial radiographic and laboratory data in the context of their chronic comorbidities is felt to place them at high risk for further clinical deterioration. Furthermore, it is not anticipated that the patient will be medically stable for discharge from the hospital within 2 midnights of admission.   * I certify that at the point of admission it is my clinical judgment that the patient will require inpatient hospital care spanning beyond  2 midnights from the point of admission due to high intensity of service, high risk for further deterioration and high frequency of surveillance required.*  Author:  Vernelle Emerald MD  10/11/2021 8:27 AM

## 2021-10-11 NOTE — Assessment & Plan Note (Addendum)
Noted on CTA chest. Acute on chronic diagnosis while on Eliquis PO. Started on Heparin IV. RV heart strain noted on imaging. Transthoracic Echocardiogram obtained which showed stable RV dysfunction from prior. Complicated by TV endocarditis with history of septic emboli. PCCM consulted with recommendations for continued anticoagulation and no thrombectomy. On room air. Hematology recommending Lovenox for treatment. Heparin not therapeutic. No procedures anticipated in the near future. Transitioned to Lovenox on 6/10. -Continue Lovenox (she's more agreeable with this now) -unchanged chest discomfort since admission related to VTE, follow (additional w/u as indicated) -telemetry monitoring

## 2021-10-11 NOTE — Consult Note (Signed)
NAME:  Latoya [name], MRN:  [id number], DOB:  1985-06-24, LOS: 0 ADMISSION DATE:  10/11/2021, CONSULTATION DATE: 10/11/2021 REFERRING MD: [facility], CHIEF COMPLAINT: Recurrent pulmonary embolism refractory pneumonia  History of Present Illness:  36 year old female with extensive past medical history IV drug abuse MSSA endocarditis now with MRSA endocarditis tricuspid regurgitation history of septic embolism and now presents with breakthrough saddle pulmonary embolism despite being on Eliquis.  He has been transferred to [facility] for infectious disease consult pulmonary critical care was asked to evaluate.  He has had an ongoing pneumonia with likely fevers despite being on antimicrobial therapy.  Questionable need for fiberoptic bronchoscopy cultures to better determine infectious process.  Pertinent  Medical History   Past Medical History:  Diagnosis Date   Heroin addiction (HCC)    IV drug abuse (HCC)      Significant Hospital Events: Including procedures, antibiotic start and stop dates in addition to other pertinent events   10/10/2021 [facility]  Interim History / Subjective:  36 year old female IV drug abuser history with breakthrough saddle pulmonary embolus despite being on Eliquis.  Objective   Blood pressure 107/71, pulse 100, temperature 99.1 F (37.3 C), temperature source Oral, resp. rate 18, height 5\' 6"  (1.676 m), weight 82.6 kg, SpO2 99 %.       No intake or output data in the 24 hours ending 10/11/21 0909 Filed Weights   10/11/21 0023  Weight: 82.6 kg    Examination: General: Female no acute distress sleeping HENT: No JVD lymphadenopathy is appreciated Lungs: Mild rhonchi Cardiovascular: Murmur regular rate and rhythm Abdomen: Soft and nontender Extremities: Warm and dry without edema Neuro: Dull effect otherwise intact GU: Voids  Resolved Hospital Problem list     Assessment & Plan:  Saddle pulmonary embolism despite being on  Eliquis with a history of septic PE. Continue heparin drip Status post 2D echo on 10/09/2021 remarkable for mobile echodensity which was been noted before tricuspid valve endocarditis persists with severe TR and moderate RV dilatation. Questionable if she is compliant with Eliquis Continue to monitor  Recurrent pneumonia Continue antimicrobial therapy Questionable need for fiberoptic bronchitis  Endocarditis with MRSA 2D echo was noted She is known to the care of infectious disease currently on vancomycin and cefepime Per infectious disease  History of lumbar discitis Continue antimicrobial therapy  History of IV drug abuse with no recent toxicology Check UDS  Best Practice (right click and "Reselect all SmartList Selections" daily)   Diet/type: Regular consistency (see orders) DVT prophylaxis: systemic heparin GI prophylaxis: PPI Lines: N/A Foley:  N/A Code Status:  full code Last date of multidisciplinary goals of care discussion [tbd]  Labs   CBC: Recent Labs  Lab 10/11/21 0257  WBC 16.1*  NEUTROABS 10.8*  HGB 8.6*  HCT 28.7*  MCV 78.6*  PLT 120*    Basic Metabolic Panel: Recent Labs  Lab 10/11/21 0257  NA 134*  K 3.7  CL 102  CO2 23  GLUCOSE 89  BUN 7  CREATININE 0.70  CALCIUM 8.4*  MG 1.9   GFR: Estimated Creatinine Clearance: 107.3 mL/min (by C-G formula based on SCr of 0.7 mg/dL). Recent Labs  Lab 10/11/21 0257  WBC 16.1*  LATICACIDVEN 1.8    Liver Function Tests: Recent Labs  Lab 10/11/21 0257  AST 9*  ALT 9  ALKPHOS 45  BILITOT 1.0  PROT 7.0  ALBUMIN 2.8*   No results for input(s): LIPASE, AMYLASE in the last 168 hours. No results  for input(s): AMMONIA in the last 168 hours.  ABG    Component Value Date/Time   PHART 7.48 (H) 08/04/2021 1610   PCO2ART 30 (L) 08/04/2021 1610   PO2ART 69 (L) 08/04/2021 1610   HCO3 22.6 08/04/2021 1610   TCO2 25 08/11/2021 1251   O2SAT 96.9 08/04/2021 1610     Coagulation Profile: Recent  Labs  Lab 10/11/21 0257  INR 1.3*    Cardiac Enzymes: No results for input(s): CKTOTAL, CKMB, CKMBINDEX, TROPONINI in the last 168 hours.  HbA1C: Hgb A1c MFr Bld  Date/Time Value Ref Range Status  08/22/2021 09:45 AM 5.1 4.8 - 5.6 % Final    Comment:    (NOTE) Pre diabetes:          5.7%-6.4%  Diabetes:              >6.4%  Glycemic control for   <7.0% adults with diabetes     CBG: No results for input(s): GLUCAP in the last 168 hours.  Review of Systems:   10 point review of system taken, please see HPI for positives and negatives.   Past Medical History:  She,  has a past medical history of Heroin addiction (HCC) and IV drug abuse (HCC).   Surgical History:   Past Surgical History:  Procedure Laterality Date   APPLICATION OF ANGIOVAC N/A 08/11/2021   Procedure: ABORTED APPLICATION OF ANGIOVAC;  Surgeon: Corliss Skains, MD;  Location: [facility] OR;  Service: Vascular;  Laterality: N/A;   IR LUMBAR DISC ASPIRATION W/IMG GUIDE  07/29/2019   TEE WITHOUT CARDIOVERSION N/A 08/08/2021   Procedure: TRANSESOPHAGEAL ECHOCARDIOGRAM (TEE);  Surgeon: Wendall Stade, MD;  Location: [facility] ENDOSCOPY;  Service: Cardiovascular;  Laterality: N/A;   TEE WITHOUT CARDIOVERSION N/A 08/11/2021   Procedure: TRANSESOPHAGEAL ECHOCARDIOGRAM (TEE);  Surgeon: Corliss Skains, MD;  Location: [facility] OR;  Service: Open Heart Surgery;  Laterality: N/A;     Social History:   reports that she has been smoking cigarettes. She has never used smokeless tobacco. She reports current alcohol use. She reports that she does not currently use drugs after having used the following drugs: Heroin.  Reports she quit smoking  Family History:  Her family history includes Healthy in her mother.   Allergies Allergies  Allergen Reactions   Naltrexone Anxiety    Severe anxiety, restlessness, vomiting     Home Medications  Prior to Admission medications   Medication Sig Start Date End Date Taking? Authorizing Provider   APIXABAN Everlene Balls) VTE STARTER PACK (10MG  AND 5MG ) Take as directed on package: start with two-5mg  tablets twice daily for 7 days. On day 8, switch to one-5mg  tablet twice daily. 09/15/21   , 11/15/21, DO  Docusate Sodium (DSS) 100 MG CAPS Take 1 capsule (100 mg total) by mouth 2 (two) times daily as needed for mild constipation. 09/15/21   Alvira Philips, 11/15/21, DO  ferrous gluconate (FERGON) 324 MG tablet Take 1 tablet (324 mg total) by mouth daily with breakfast. 09/15/21 10/15/21  11/15/21, 12/15/21, DO  gabapentin (NEURONTIN) 600 MG tablet Take 0.5 tablets (300 mg total) by mouth 3 (three) times daily. 09/15/21 10/15/21  11/15/21, 12/15/21, DO  linezolid (ZYVOX) 600 MG tablet Take 1 tablet (600 mg total) by mouth 2 (two) times daily. 09/26/21 10/26/21  Vu, 09/28/21 T, MD  oxyCODONE (ROXICODONE) 5 MG immediate release tablet Take 1 tablet (5 mg total) by mouth every 8 (eight) hours as needed. Patient not taking: Reported on 09/26/2021 09/15/21  09/15/22  [place], Eric J, DO  pantoprazole (PROTONIX) 40 MG tablet Take 1 tablet (40 mg total) by mouth daily. 09/15/21 10/15/21  [place], Alvira Philips, DO  pregabalin (LYRICA) 75 MG capsule Take 1 capsule (75 mg total) by mouth 3 (three) times daily. 09/15/21 10/15/21  [place], Eric J, DO     Critical care time: Elizebeth Brooking Basil Buffin ACNP Acute Care Nurse Practitioner [facility] Pulmonary/Critical Care Please consult Amion 10/11/2021, 9:10 AM

## 2021-10-11 NOTE — Assessment & Plan Note (Addendum)
Noted on imaging. ID consulted. Neurosurgery curbsided. Recommendations for IR aspiration/biopsy. IR unable to perform secondary to no target for aspiration/biopsy. In setting of persistent fevers concerning for persistent bacteremia. -See problem, Endocarditis of tricuspid valve

## 2021-10-11 NOTE — Progress Notes (Signed)
PROGRESS NOTE    Latoya [name]  [id number] DOB: 11/18/1985 DOA: 10/11/2021 PCP: Pcp, No   Brief Narrative: Latoya [name] is a 36 y.o. female with a history of MSSA tricuspid valve endocarditis, MRSA L4-L5 facet  join septic discitis in 07/2019 and more recently diagnosis of MRSA bacteremia with infective endocarditis and tricuspid valve vegetation diagnosed 07/2021. Patient presented from [facility] for saddle pulmonary embolism.   Assessment and Plan: * Acute saddle pulmonary embolism (HCC) Noted on CTA chest. Acute on chronic diagnosis while on Eliquis PO. Started on Heparin IV. RV heart strain noted on imaging. Transthoracic Echocardiogram obtained which showed stable RV dysfunction from prior. PCCM consulted with recommendations for continued anticoagulation. On room air. -Continue IV heparin -PCCM recommendations: continue heparin -Consult hematology in AM for anticoagulation recommendations  Lumbar discitis Noted on imaging. ID consulted. Neurosurgery curbsided. Recommendations for IR aspiration/biopsy. IR unable to perform secondary to no target for aspiration/biopsy. In setting of persistent fevers concerning for persistent bacteremia. -See problem, Endocarditis of tricuspid valve  Endocarditis of tricuspid valve Previously diagnosed. Initial plan for angio vac which was canceled secondary to PFO. Patient was treated for this as an inpatient and discharged on linezolid. Vancomycin and Cefepime restarted on admission. ID consulted. -ID recommendations: continue Vancomycin/Cefepime, follow-up blood cultures  Pneumonia of left lower lobe due to infectious organism Patient with a history of septic emboli on prior admission. Patient noted to have evidence of LLL pneumonia. Started on antibiotics as mentioned in problem, Endocarditis of tricuspid valve.  MRSA bacteremia -See problem, Endocarditis of tricuspid valve  IV drug abuse [facility]) Patient reports continued  abstinence since March of 2023. Patient reports not requiring resources for continued abstinence.  Thrombocytopenia (HCC) Mild. In setting of infection. -CBC in AM    DVT prophylaxis: Heparin IV Code Status:   Code Status: Full Code Family Communication: None at bedside Disposition Plan: Discharge home pending plan for continued antibiotics   Consultants:  Infectious disease PCCM  Procedures:  None  Antimicrobials: Vancomycin Cefepime    Subjective: Patient reports no issues this morning.  Objective: BP 113/74 (BP Location: Left Leg)   Pulse (!) 109   Temp (!) 100.8 F (38.2 C) (Oral)   Resp 20   Ht 5\' 6"  (1.676 m)   Wt 82.6 kg   SpO2 100%   BMI 29.41 kg/m   Examination:  General exam: Appears calm and comfortable Respiratory system: Rales noted. Respiratory effort normal. Cardiovascular system: S1 & S2 heard, RRR. Gastrointestinal system: Abdomen is nondistended, soft and nontender. Normal bowel sounds heard. Central nervous system: Alert and oriented. No focal neurological deficits. Musculoskeletal: No edema. No calf tenderness Skin: No cyanosis. No rashes Psychiatry: Judgement and insight appear normal. Mood & affect appropriate.    Data Reviewed: I have personally reviewed following labs and imaging studies  CBC Lab Results  Component Value Date   WBC 16.1 (H) 10/11/2021   RBC 3.65 (L) 10/11/2021   HGB 8.6 (L) 10/11/2021   HCT 28.7 (L) 10/11/2021   MCV 78.6 (L) 10/11/2021   MCH 23.6 (L) 10/11/2021   PLT 120 (L) 10/11/2021   MCHC 30.0 10/11/2021   RDW 19.8 (H) 10/11/2021   LYMPHSABS 3.1 10/11/2021   MONOABS 1.8 (H) 10/11/2021   EOSABS 0.2 10/11/2021   BASOSABS 0.1 [id number]     Last metabolic panel Lab Results  Component Value Date   NA 134 (L) 10/11/2021   K 3.7 10/11/2021   CL 102 10/11/2021   CO2 23 10/11/2021  BUN 7 10/11/2021   CREATININE 0.70 10/11/2021   GLUCOSE 89 10/11/2021   GFRNONAA >60 10/11/2021   GFRAA >60  08/07/2019   CALCIUM 8.4 (L) 10/11/2021   PHOS 3.5 09/13/2021   PROT 7.0 10/11/2021   ALBUMIN 2.8 (L) 10/11/2021   BILITOT 1.0 10/11/2021   ALKPHOS 45 10/11/2021   AST 9 (L) 10/11/2021   ALT 9 10/11/2021   ANIONGAP 9 10/11/2021    GFR: Estimated Creatinine Clearance: 107.3 mL/min (by C-G formula based on SCr of 0.7 mg/dL).  Recent Results (from the past 240 hour(s))  MRSA Next Gen by PCR, Nasal     Status: None   Collection Time: 10/11/21  9:58 AM   Specimen: Nasal Mucosa; Nasal Swab  Result Value Ref Range Status   MRSA by PCR Next Gen NOT DETECTED NOT DETECTED Final    Comment: (NOTE) The GeneXpert MRSA Assay (FDA approved for NASAL specimens only), is one component of a comprehensive MRSA colonization surveillance program. It is not intended to diagnose MRSA infection nor to guide or monitor treatment for MRSA infections. Test performance is not FDA approved in patients less than 66 years old. Performed at [facility], [place] [address], [place], [place] [address]       Radiology Studies: No results found.    LOS: 0 days    Cordelia Poche, MD [facility] 10/11/2021, 6:41 PM   If 7PM-7AM, please contact night-coverage [email or web address]

## 2021-10-11 NOTE — Progress Notes (Signed)
ANTICOAGULATION CONSULT NOTE  Pharmacy Consult for Heparin  Indication: pulmonary embolus  Allergies  Allergen Reactions   Naltrexone Anxiety    Severe anxiety Restlessness  Vomiting     Patient Measurements: Height: 5\' 6"  (167.6 cm) Weight: 82.6 kg (182 lb 3.2 oz) IBW/kg (Calculated) : 59.3  Vital Signs: Temp: 101.1 F (38.4 C) (06/07 2100) Temp Source: Oral (06/07 2100) BP: 121/73 (06/07 2100) Pulse Rate: 117 (06/07 2100)  Labs: Recent Labs    10/11/21 0257 10/11/21 1139 10/11/21 2146  HGB 8.6*  --   --   HCT 28.7*  --   --   PLT 120*  --   --   APTT 50*  --   --   LABPROT 16.4*  --   --   INR 1.3*  --   --   HEPARINUNFRC 0.22* 0.10* 0.11*  CREATININE 0.70  --   --   TROPONINIHS 7  --   --     Estimated Creatinine Clearance: 107.3 mL/min (by C-G formula based on SCr of 0.7 mg/dL).   Medical History: Past Medical History:  Diagnosis Date   Heroin addiction (HCC)    IV drug abuse (HCC)    Assessment: 82 yoF transferred from OSH with PE. Pt started on IV heparin drip. Pt has hx septic pulmonary emboli and reports adherence to PTA Eliquis.  Heparin level subtherapeutic= 0.11 on 1850 units/hr, no infusion issues or overt bleeding reported  Goal of Therapy:  Heparin level 0.3-0.7 units/ml Monitor platelets by anticoagulation protocol: Yes   Plan:  Rebolus heparin 2000 units x1 Increase heparin (~4 units/kg/hr) to 2100 units/hr Recheck heparin level in 6h   20, PharmD Clinical Pharmacist 10/11/2021 11:33 PM Please check AMION for all [facility] Pharmacy numbers

## 2021-10-11 NOTE — Assessment & Plan Note (Addendum)
-  See problem, Endocarditis of tricuspid valve

## 2021-10-11 NOTE — Progress Notes (Signed)
ANTICOAGULATION CONSULT NOTE  Pharmacy Consult for Heparin  Indication: pulmonary embolus  Allergies  Allergen Reactions   Naltrexone Anxiety    Severe anxiety Restlessness  Vomiting     Patient Measurements: Height: 5\' 6"  (167.6 cm) Weight: 82.6 kg (182 lb 3.2 oz) IBW/kg (Calculated) : 59.3  Vital Signs: Temp: 99.1 F (37.3 C) (06/07 0746) Temp Source: Oral (06/07 0746) BP: 123/75 (06/07 1213) Pulse Rate: 111 (06/07 1213)  Labs: Recent Labs    10/11/21 0257 10/11/21 1139  HGB 8.6*  --   HCT 28.7*  --   PLT 120*  --   APTT 50*  --   LABPROT 16.4*  --   INR 1.3*  --   HEPARINUNFRC 0.22* 0.10*  CREATININE 0.70  --   TROPONINIHS 7  --      Estimated Creatinine Clearance: 107.3 mL/min (by C-G formula based on SCr of 0.7 mg/dL).   Medical History: Past Medical History:  Diagnosis Date   Heroin addiction (HCC)    IV drug abuse (HCC)    Assessment: 32 yoF transferred from OSH with PE. Pt started on IV heparin drip. Pt has hx septic pulmonary emboli and reports adherence to PTA Eliquis.  Repeat heparin level subtherapeutic at 0.1.  Goal of Therapy:  Heparin level 0.3-0.7 units/ml Monitor platelets by anticoagulation protocol: Yes   Plan:  Rebolus heparin 2000 units x1 Inc heparin to 1850 units/h Recheck heparin level in 6h   20, PharmD, [place], [facility] Clinical Pharmacist [phone] Please check AMION for all [facility] Pharmacy numbers 10/11/2021

## 2021-10-11 NOTE — Progress Notes (Signed)
Pharmacy Antibiotic Note  Latoya [name] is a 36 y.o. female admitted on 10/11/2021 with CP and back pain. Pt noted to have acute PE. Pt has significant history of MSSA and MRSA bacteremia c/b endocarditis and lumbar discitis. Pt recently completed 6 weeks of vancomycin and was started on linezolid after. Pharmacy has been consulted for vancomycin and cefepime dosing. Cr stable <1.  Plan: Vancomycin 1750mg  IV x1 then 1250mg  q12h - est AUC 516 Cefepime 2g IV q8h  Height: 5\' 6"  (167.6 cm) Weight: 82.6 kg (182 lb 3.2 oz) IBW/kg (Calculated) : 59.3  Temp (24hrs), Avg:99.3 F (37.4 C), Min:98.4 F (36.9 C), Max:100.5 F (38.1 C)  Recent Labs  Lab 10/11/21 0257  WBC 16.1*  CREATININE 0.70  LATICACIDVEN 1.8    Estimated Creatinine Clearance: 107.3 mL/min (by C-G formula based on SCr of 0.7 mg/dL).    Allergies  Allergen Reactions   Naltrexone Anxiety    Severe anxiety, restlessness, vomiting    Antimicrobials this admission: Vancomycin 6/7 >>  Cefepime 6/7 >>   Dose adjustments this admission: none  Microbiology results: sent  Thank you for allowing pharmacy to be a part of this patient's care.   Arrie Senate, PharmD, BCPS, [facility] Clinical Pharmacist [phone] Please check AMION for all [place] numbers 10/11/2021

## 2021-10-11 NOTE — Assessment & Plan Note (Addendum)
resolved 

## 2021-10-11 NOTE — Assessment & Plan Note (Addendum)
History of. Patient reports continued abstinence since March of 2023. Patient reports not requiring resources for continued abstinence.

## 2021-10-11 NOTE — Progress Notes (Signed)
ANTICOAGULATION CONSULT NOTE - Initial Consult  Pharmacy Consult for Heparin  Indication: pulmonary embolus  Allergies  Allergen Reactions   Naltrexone Anxiety    Severe anxiety, restlessness, vomiting    Patient Measurements: Height: 5\' 6"  (167.6 cm) Weight: 82.6 kg (182 lb 3.2 oz) IBW/kg (Calculated) : 59.3  Vital Signs: Temp: 100.5 F (38.1 C) (06/07 0300) Temp Source: Oral (06/07 0300) BP: 115/68 (06/07 0300) Pulse Rate: 103 (06/07 0300)  Labs: Recent Labs    10/11/21 0257  HGB 8.6*  HCT 28.7*  PLT 120*  APTT 50*  LABPROT 16.4*  INR 1.3*  HEPARINUNFRC 0.22*  CREATININE 0.70  TROPONINIHS 7    Estimated Creatinine Clearance: 107.3 mL/min (by C-G formula based on SCr of 0.7 mg/dL).   Medical History: Past Medical History:  Diagnosis Date   Heroin addiction ([place])    IV drug abuse [facility])    Assessment: 36 y/o F transfer from [facility] with PE. She was transferred with a heparin drip infusing at 1400 units/hr.  Heparin level is low at 0.22. Was supposed to be on Apixaban PTA, unsure of compliance. Hgb 8.6   Goal of Therapy:  Heparin level 0.3-0.7 units/ml Monitor platelets by anticoagulation protocol: Yes   Plan:  Heparin 2000 units BOLUS Inc heparin to 1600 units/hr 1200 heparin level  Narda Bonds, PharmD, BCPS Clinical Pharmacist Phone: [phone]

## 2021-10-12 ENCOUNTER — Inpatient Hospital Stay: Payer: Self-pay

## 2021-10-12 DIAGNOSIS — I2692 Saddle embolus of pulmonary artery without acute cor pulmonale: Secondary | ICD-10-CM | POA: Diagnosis not present

## 2021-10-12 DIAGNOSIS — Z87898 Personal history of other specified conditions: Secondary | ICD-10-CM

## 2021-10-12 DIAGNOSIS — I079 Rheumatic tricuspid valve disease, unspecified: Secondary | ICD-10-CM | POA: Diagnosis not present

## 2021-10-12 DIAGNOSIS — Z8739 Personal history of other diseases of the musculoskeletal system and connective tissue: Secondary | ICD-10-CM

## 2021-10-12 DIAGNOSIS — I2699 Other pulmonary embolism without acute cor pulmonale: Secondary | ICD-10-CM

## 2021-10-12 DIAGNOSIS — B49 Unspecified mycosis: Secondary | ICD-10-CM | POA: Diagnosis not present

## 2021-10-12 DIAGNOSIS — I330 Acute and subacute infective endocarditis: Secondary | ICD-10-CM | POA: Diagnosis not present

## 2021-10-12 DIAGNOSIS — R7881 Bacteremia: Secondary | ICD-10-CM | POA: Diagnosis not present

## 2021-10-12 DIAGNOSIS — B377 Candidal sepsis: Secondary | ICD-10-CM | POA: Insufficient documentation

## 2021-10-12 DIAGNOSIS — B9562 Methicillin resistant Staphylococcus aureus infection as the cause of diseases classified elsewhere: Secondary | ICD-10-CM | POA: Diagnosis not present

## 2021-10-12 DIAGNOSIS — F1910 Other psychoactive substance abuse, uncomplicated: Secondary | ICD-10-CM | POA: Diagnosis not present

## 2021-10-12 DIAGNOSIS — M4646 Discitis, unspecified, lumbar region: Secondary | ICD-10-CM | POA: Diagnosis not present

## 2021-10-12 HISTORY — DX: Candidal sepsis: B37.7

## 2021-10-12 LAB — BLOOD CULTURE ID PANEL (REFLEXED) - BCID2

## 2021-10-12 LAB — BASIC METABOLIC PANEL
Anion gap: 9 (ref 5–15)
BUN: 7 mg/dL (ref 6–20)
CO2: 21 mmol/L — ABNORMAL LOW (ref 22–32)
Calcium: 7.9 mg/dL — ABNORMAL LOW (ref 8.9–10.3)
Chloride: 101 mmol/L (ref 98–111)
Creatinine, Ser: 0.72 mg/dL (ref 0.44–1.00)
GFR, Estimated: >60 mL/min (ref >60)
Glucose, Bld: 150 mg/dL — ABNORMAL HIGH (ref 70–99)
Potassium: 3.4 mmol/L — ABNORMAL LOW (ref 3.5–5.1)
Sodium: 131 mmol/L — ABNORMAL LOW (ref 135–145)

## 2021-10-12 LAB — CBC
HCT: 28.1 % — ABNORMAL LOW (ref 36.0–46.0)
Hemoglobin: 8.6 g/dL — ABNORMAL LOW (ref 12.0–15.0)
MCH: 23.8 pg — ABNORMAL LOW (ref 26.0–34.0)
MCHC: 30.6 g/dL (ref 30.0–36.0)
MCV: 77.6 fL — ABNORMAL LOW (ref 80.0–100.0)
Platelets: 130 10*3/uL — ABNORMAL LOW (ref 150–400)
RBC: 3.62 MIL/uL — ABNORMAL LOW (ref 3.87–5.11)
RDW: 20.1 % — ABNORMAL HIGH (ref 11.5–15.5)
WBC: 19.4 10*3/uL — ABNORMAL HIGH (ref 4.0–10.5)
nRBC: 0.1 % (ref 0.0–0.2)

## 2021-10-12 LAB — HEPARIN LEVEL (UNFRACTIONATED)
Heparin Unfractionated: 0.23 IU/mL — ABNORMAL LOW (ref 0.30–0.70)
Heparin Unfractionated: 0.27 IU/mL — ABNORMAL LOW (ref 0.30–0.70)

## 2021-10-12 MED ORDER — SODIUM CHLORIDE 0.9 % IV SOLN
150.0000 mg | INTRAVENOUS | Status: DC
  Administered 2021-10-12 – 2021-10-13 (×2): 150 mg via INTRAVENOUS
  Filled 2021-10-12 (×2): qty 7.5
  Filled 2021-10-12: qty 15

## 2021-10-12 MED ORDER — OXYCODONE-ACETAMINOPHEN 5-325 MG PO TABS
1.0000 | ORAL_TABLET | ORAL | Status: DC | PRN
  Administered 2021-10-12 – 2021-10-13 (×2): 2 via ORAL
  Filled 2021-10-12 (×2): qty 2

## 2021-10-12 MED ORDER — HEPARIN BOLUS VIA INFUSION
1000.0000 [IU] | Freq: Once | INTRAVENOUS | Status: AC
  Administered 2021-10-12: 1000 [IU] via INTRAVENOUS
  Filled 2021-10-12: qty 1000

## 2021-10-12 MED ORDER — GUAIFENESIN-DM 100-10 MG/5ML PO SYRP
5.0000 mL | ORAL_SOLUTION | ORAL | Status: DC | PRN
  Administered 2021-10-12: 5 mL via ORAL
  Filled 2021-10-12: qty 5

## 2021-10-12 MED ORDER — MORPHINE SULFATE (PF) 2 MG/ML IV SOLN
2.0000 mg | INTRAVENOUS | Status: DC | PRN
  Administered 2021-10-12 – 2021-10-16 (×17): 2 mg via INTRAVENOUS
  Filled 2021-10-12 (×17): qty 1

## 2021-10-12 NOTE — Assessment & Plan Note (Addendum)
New diagnosis with blood culture (6/7) resulting yeast. Patient started on micafungin IV. Leukocytosis fluctuating, follow. -Continue micafungin IV -Follow-up repeat blood cultures (6/10) NGx5 -PICC placed by IR 6/15

## 2021-10-12 NOTE — Progress Notes (Signed)
ID PROGRESS NOTE  Still having fevers, blood cx grew +yeast RN report limited piv access   - will add micafungin (unable to due fluconazole due to elevated QTC for the time being) - continue on vanco and cefepime to see if any other pathogens identified on blood cx - will need to wait for central access if possible since she has fungemia and just started treatment - will eventually need central access for prolonged iv abtx --TV endocarditis and lumbar discitis  Latoya [name] B. Drue Second MD MPH [facility] [phone]

## 2021-10-12 NOTE — Progress Notes (Signed)
ANTICOAGULATION CONSULT NOTE  Pharmacy Consult for Heparin  Indication: pulmonary embolus  Allergies  Allergen Reactions   Naltrexone Anxiety    Severe anxiety Restlessness  Vomiting     Patient Measurements: Height: 5\' 6"  (167.6 cm) Weight: 82.6 kg (182 lb 3.2 oz) IBW/kg (Calculated) : 59.3  Vital Signs: Temp: 102.1 F (38.9 C) (06/08 0350) Temp Source: Oral (06/08 0350) BP: 132/74 (06/08 0350) Pulse Rate: 117 (06/07 2100)  Labs: Recent Labs    10/11/21 0257 10/11/21 1139 10/11/21 2146 10/12/21 0542  HGB 8.6*  --   --   --   HCT 28.7*  --   --   --   PLT 120*  --   --   --   APTT 50*  --   --   --   LABPROT 16.4*  --   --   --   INR 1.3*  --   --   --   HEPARINUNFRC 0.22* 0.10* 0.11* 0.23*  CREATININE 0.70  --   --   --   TROPONINIHS 7  --   --   --     Estimated Creatinine Clearance: 107.3 mL/min (by C-G formula based on SCr of 0.7 mg/dL).   Medical History: Past Medical History:  Diagnosis Date   Heroin addiction (HCC)    IV drug abuse (HCC)    Assessment: 32 yoF transferred from OSH with PE. Pt started on IV heparin drip. Pt has hx septic pulmonary emboli and reports adherence to PTA Eliquis.  Heparin level subtherapeutic= 0.23 following bolus and rate increase to 2100 units/hr, no infusion issues or overt bleeding reported  Goal of Therapy:  Heparin level 0.3-0.7 units/ml Monitor platelets by anticoagulation protocol: Yes   Plan:  Rebolus heparin 1000 units x1 Increase heparin (~2.5 units/kg/hr) to 2300 units/hr Recheck heparin level in 6h   20, PharmD Clinical Pharmacist 10/12/2021 6:23 AM Please check AMION for all [facility] Pharmacy numbers

## 2021-10-12 NOTE — Progress Notes (Signed)
NAME:  Latoya [name], MRN:  [id number], DOB:  [birth year], LOS: 1 ADMISSION DATE:  10/11/2021, CONSULTATION DATE: 10/11/2021 REFERRING MD: [facility], CHIEF COMPLAINT: Recurrent pulmonary embolism, refractory pneumonia  History of Present Illness:  36 year old female with extensive past medical history IV drug abuse MSSA endocarditis now with MRSA endocarditis tricuspid regurgitation history of septic embolism and now presents with breakthrough saddle pulmonary embolism despite being on Eliquis.  He has been transferred to [facility] for infectious disease consult pulmonary critical care was asked to evaluate.  He has had an ongoing pneumonia with likely fevers despite being on antimicrobial therapy.  Questionable need for fiberoptic bronchoscopy cultures to better determine infectious process.    Pertinent  Medical History   Past Medical History:  Diagnosis Date   Heroin addiction ([place])    IV drug abuse ([place])     Significant Hospital Events: Including procedures, antibiotic start and stop dates in addition to other pertinent events   6/6-transferred to [facility] for management of pulmonary embolism  Interim History / Subjective:  Still with fevers  Objective   Blood pressure 102/76, pulse 100, temperature 99.3 F (37.4 C), temperature source Oral, resp. rate 16, height 5\' 6"  (1.676 m), weight 82.6 kg, SpO2 99 %.        Intake/Output Summary (Last 24 hours) at 10/12/2021 1005 Last data filed at 10/11/2021 1500 Gross per 24 hour  Intake 651.7 ml  Output --  Net 651.7 ml   Filed Weights   10/11/21 0023  Weight: 82.6 kg    Examination: General: Middle-age lady, does not appear to be in extremities HENT: Moist oral mucosa Lungs: Clear breath sounds Cardiovascular: S1-S2 appreciated Abdomen: Soft, bowel sounds appreciated Extremities: No clubbing, no edema Neuro: Alert and oriented x3 GU:   Resolved Hospital Problem list     Assessment & Plan:  Pulmonary embolism despite  being on Eliquis with a history of septic emboli -Remains on a heparin -Option of treatment will be to switch to low molecular weight heparin and another DOAC may be tried if no plans for any invasive intervention  Tricuspid valve endocarditis -Felt not to be angio vac candidate secondary to noted PFO  Endocarditis with MRSA -Currently on vancomycin and cefepime  History of lumbar discitis -On antibiotics at present  History of IV drug use -States she has been sober since March  Best Practice (right click and "Reselect all SmartList Selections" daily)   Diet/type: Regular consistency (see orders) DVT prophylaxis: systemic heparin GI prophylaxis: PPI Lines: N/A Foley:  N/A Code Status:  full code Last date of multidisciplinary goals of care discussion [per primary]  Labs   CBC: Recent Labs  Lab 10/11/21 0257 10/12/21 0542  WBC 16.1* 19.4*  NEUTROABS 10.8*  --   HGB 8.6* 8.6*  HCT 28.7* 28.1*  MCV 78.6* 77.6*  PLT 120* 130*    Basic Metabolic Panel: Recent Labs  Lab 10/11/21 0257 10/12/21 0542  NA 134* 131*  K 3.7 3.4*  CL 102 101  CO2 23 21*  GLUCOSE 89 150*  BUN 7 7  CREATININE 0.70 0.72  CALCIUM 8.4* 7.9*  MG 1.9  --    GFR: Estimated Creatinine Clearance: 107.3 mL/min (by C-G formula based on SCr of 0.72 mg/dL). Recent Labs  Lab 10/11/21 0257 10/12/21 0542  WBC 16.1* 19.4*  LATICACIDVEN 1.8  --     Liver Function Tests: Recent Labs  Lab 10/11/21 0257  AST 9*  ALT 9  ALKPHOS 45  BILITOT 1.0  PROT 7.0  ALBUMIN 2.8*   No results for input(s): "LIPASE", "AMYLASE" in the last 168 hours. No results for input(s): "AMMONIA" in the last 168 hours.  ABG    Component Value Date/Time   PHART 7.48 (H) 08/04/2021 1610   PCO2ART 30 (L) 08/04/2021 1610   PO2ART 69 (L) 08/04/2021 1610   HCO3 22.6 08/04/2021 1610   TCO2 25 08/11/2021 1251   O2SAT 96.9 08/04/2021 1610     Coagulation Profile: Recent Labs  Lab 10/11/21 0257  INR 1.3*     Cardiac Enzymes: No results for input(s): "CKTOTAL", "CKMB", "CKMBINDEX", "TROPONINI" in the last 168 hours.  HbA1C: Hgb A1c MFr Bld  Date/Time Value Ref Range Status  08/22/2021 09:45 AM 5.1 4.8 - 5.6 % Final    Comment:    (NOTE) Pre diabetes:          5.7%-6.4%  Diabetes:              >6.4%  Glycemic control for   <7.0% adults with diabetes     CBG: No results for input(s): "GLUCAP" in the last 168 hours.  Review of Systems:   Some chest discomfort, sputum production  Past Medical History:  She,  has a past medical history of Heroin addiction ([place]) and IV drug abuse ([place]).   Surgical History:   Past Surgical History:  Procedure Laterality Date   APPLICATION OF ANGIOVAC N/A 08/11/2021   Procedure: ABORTED APPLICATION OF ANGIOVAC;  Surgeon: Lajuana Matte, MD;  Location: [place];  Service: Vascular;  Laterality: N/A;   IR LUMBAR [place] W/IMG GUIDE  07/29/2019   TEE WITHOUT CARDIOVERSION N/A 08/08/2021   Procedure: TRANSESOPHAGEAL ECHOCARDIOGRAM (TEE);  Surgeon: Josue Hector, MD;  [facility] ENDOSCOPY;  Service: Cardiovascular;  Laterality: N/A;   TEE WITHOUT CARDIOVERSION N/A 08/11/2021   Procedure: TRANSESOPHAGEAL ECHOCARDIOGRAM (TEE);  Surgeon: Lajuana Matte, MD;  Location: [facility];  Service: Open Heart Surgery;  Laterality: N/A;     Social History:   reports that she has been smoking cigarettes. She has never used smokeless tobacco. She reports current alcohol use. She reports that she does not currently use drugs after having used the following drugs: Heroin.   Family History:  Her family history includes Healthy in her mother.   Allergies Allergies  Allergen Reactions   Naltrexone Anxiety    Severe anxiety Restlessness  Vomiting      Home Medications  Prior to Admission medications   Medication Sig Start Date End Date Taking? Authorizing Provider  acetaminophen (TYLENOL) 325 MG tablet Take 650 mg by mouth every 6 (six) hours as  needed for mild pain.   Yes [provider]  ferrous gluconate (FERGON) 324 MG tablet Take 1 tablet (324 mg total) by mouth daily with breakfast. 09/15/21 10/15/21 Yes [place] Eric J, DO  gabapentin (NEURONTIN) 600 MG tablet Take 0.5 tablets (300 mg total) by mouth 3 (three) times daily. 09/15/21 10/15/21 Yes [place] Eric J, DO  linezolid (ZYVOX) 600 MG tablet Take 1 tablet (600 mg total) by mouth 2 (two) times daily. Patient taking differently: Take 600 mg by mouth 2 (two) times daily. 30 day course. 09/26/21 10/26/21 Yes Vu, Rockey Situ, MD  pantoprazole (PROTONIX) 40 MG tablet Take 1 tablet (40 mg total) by mouth daily. 09/15/21 10/15/21 Yes [place] Eric J, DO  pregabalin (LYRICA) 75 MG capsule Take 1 capsule (75 mg total) by mouth 3 (three) times daily. 09/15/21 10/15/21 Yes [place] Eric J, DO  APIXABAN (  ELIQUIS) VTE STARTER PACK (10MG  AND 5MG ) Take as directed on package: start with two-5mg  tablets twice daily for 7 days. On day 8, switch to one-5mg  tablet twice daily. Patient taking differently: Take 5 mg by mouth 2 (two) times daily. 09/15/21   [place] Donnamarie Poag, DO  Docusate Sodium (DSS) 100 MG CAPS Take 1 capsule (100 mg total) by mouth 2 (two) times daily as needed for mild constipation. Patient not taking: Reported on 10/11/2021 09/15/21   [place] Donnamarie Poag, DO  oxyCODONE (ROXICODONE) 5 MG immediate release tablet Take 1 tablet (5 mg total) by mouth every 8 (eight) hours as needed. Patient not taking: Reported on 09/26/2021 09/15/21 09/15/22  [place] Donnamarie Poag, DO     Sherrilyn Rist, MD [place] PCCM Pager: See Shea Evans

## 2021-10-12 NOTE — Progress Notes (Signed)
ANTICOAGULATION CONSULT NOTE  Pharmacy Consult for Heparin  Indication: pulmonary embolus  Allergies  Allergen Reactions   Naltrexone Anxiety    Severe anxiety Restlessness  Vomiting     Patient Measurements: Height: 5\' 6"  (167.6 cm) Weight: 82.6 kg (182 lb 3.2 oz) IBW/kg (Calculated) : 59.3  Vital Signs: Temp: 99.9 F (37.7 C) (06/08 1624) Temp Source: Oral (06/08 1624) BP: 125/73 (06/08 1624) Pulse Rate: 108 (06/08 1624)  Labs: Recent Labs    10/11/21 0257 10/11/21 1139 10/11/21 2146 10/12/21 0542 10/12/21 1511  HGB 8.6*  --   --  8.6*  --   HCT 28.7*  --   --  28.1*  --   PLT 120*  --   --  130*  --   APTT 50*  --   --   --   --   LABPROT 16.4*  --   --   --   --   INR 1.3*  --   --   --   --   HEPARINUNFRC 0.22*   < > 0.11* 0.23* 0.27*  CREATININE 0.70  --   --  0.72  --   TROPONINIHS 7  --   --   --   --    < > = values in this interval not displayed.    Estimated Creatinine Clearance: 107.3 mL/min (by C-G formula based on SCr of 0.72 mg/dL).   Medical History: Past Medical History:  Diagnosis Date   Heroin addiction (HCC)    IV drug abuse (HCC)    Assessment: 43 yoF transferred from OSH with PE. Pt started on IV heparin drip. Pt has hx septic pulmonary emboli and reports adherence to PTA Eliquis.  Heparin level subtherapeutic at 0.27 but trending up.  Goal of Therapy:  Heparin level 0.3-0.7 units/ml Monitor platelets by anticoagulation protocol: Yes   Plan:  Rebolus heparin 1000 units x1 Increase heparin to 2500 units/hr Recheck heparin level with am labs   20, PharmD, BCPS, [facility] Clinical Pharmacist Please check AMION for all [facility] Pharmacy numbers 10/12/2021

## 2021-10-12 NOTE — Progress Notes (Signed)
PROGRESS NOTE    Latoya [name]  CBJ:[id number] DOB: [birth year] DOA: 10/11/2021 PCP: Pcp, No   Brief Narrative: Latoya [name] is a 36 y.o. female with a history of MSSA tricuspid valve endocarditis, MRSA L4-L5 facet  join septic discitis in 07/2019 and more recently diagnosis of MRSA bacteremia with infective endocarditis and tricuspid valve vegetation diagnosed 07/2021. Patient presented from [facility] for saddle pulmonary embolism.   Assessment and Plan: * Acute saddle pulmonary embolism (HCC) Noted on CTA chest. Acute on chronic diagnosis while on Eliquis PO. Started on Heparin IV. RV heart strain noted on imaging. Transthoracic Echocardiogram obtained which showed stable RV dysfunction from prior. PCCM consulted with recommendations for continued anticoagulation. On room air. -Continue IV heparin -PCCM recommendations: Continue heparin IV -Consult hematology anticoagulation recommendations  Lumbar discitis Noted on imaging. ID consulted. Neurosurgery curbsided. Recommendations for IR aspiration/biopsy. IR unable to perform secondary to no target for aspiration/biopsy. In setting of persistent fevers concerning for persistent bacteremia. -See problem, Endocarditis of tricuspid valve  Endocarditis of tricuspid valve Previously diagnosed. Initial plan for angio vac which was canceled secondary to PFO. Patient was treated for this as an inpatient and discharged on linezolid. Vancomycin and Cefepime restarted on admission. ID consulted. -ID recommendations: continue Vancomycin/Cefepime, follow-up blood cultures -TCTS consulted on 6/8 for recommendations on persistent large TV vegetation  Pneumonia of left lower lobe due to infectious organism Patient with a history of septic emboli on prior admission. Patient noted to have evidence of LLL pneumonia. Started on antibiotics as mentioned in problem, Endocarditis of tricuspid valve.  MRSA bacteremia -See problem, Endocarditis of  tricuspid valve  IV drug abuse [facility]) Patient reports continued abstinence since March of 2023. Patient reports not requiring resources for continued abstinence.  Thrombocytopenia (HCC) Mild. In setting of infection. Stable.  Fungemia New diagnosis with blood culture (6/7) resulting yeast. ID aware and have started micafungin IV.    DVT prophylaxis: Heparin IV Code Status:   Code Status: Full Code Family Communication: None at bedside. Mother on telephone. Disposition Plan: Discharge home pending plan for continued antibiotics/antifungals   Consultants:  Infectious disease PCCM  Procedures:  None  Antimicrobials: Vancomycin Cefepime    Subjective: No concerns this morning.  Objective: BP 125/73 (BP Location: Left Leg)   Pulse (!) 108   Temp 99.9 F (37.7 C) (Oral)   Resp 16   Ht 5\' 6"  (1.676 m)   Wt 82.6 kg   SpO2 100%   BMI 29.41 kg/m   Examination:  General exam: Appears calm and comfortable Respiratory system: Clear to auscultation. Respiratory effort normal. Cardiovascular system: S1 & S2 heard, RRR. 2/6 systolic murmur Gastrointestinal system: Abdomen is nondistended, soft and nontender. Normal bowel sounds heard. Central nervous system: Alert and oriented. No focal neurological deficits. Musculoskeletal: No edema. No calf tenderness Skin: No cyanosis. No rashes Psychiatry: Judgement and insight appear normal. Mood & affect appropriate.    Data Reviewed: I have personally reviewed following labs and imaging studies  CBC Lab Results  Component Value Date   WBC 19.4 (H) 10/12/2021   RBC 3.62 (L) 10/12/2021   HGB 8.6 (L) 10/12/2021   HCT 28.1 (L) 10/12/2021   MCV 77.6 (L) 10/12/2021   MCH 23.8 (L) 10/12/2021   PLT 130 (L) 10/12/2021   MCHC 30.6 10/12/2021   RDW 20.1 (H) 10/12/2021   LYMPHSABS 3.1 10/11/2021   MONOABS 1.8 (H) 10/11/2021   EOSABS 0.2 10/11/2021   BASOSABS 0.1 10/11/2021     Last metabolic  panel Lab Results  Component  Value Date   NA 131 (L) 10/12/2021   K 3.4 (L) 10/12/2021   CL 101 10/12/2021   CO2 21 (L) 10/12/2021   BUN 7 10/12/2021   CREATININE 0.72 10/12/2021   GLUCOSE 150 (H) 10/12/2021   GFRNONAA >60 10/12/2021   GFRAA >60 08/07/2019   CALCIUM 7.9 (L) 10/12/2021   PHOS 3.5 09/13/2021   PROT 7.0 10/11/2021   ALBUMIN 2.8 (L) 10/11/2021   BILITOT 1.0 10/11/2021   ALKPHOS 45 10/11/2021   AST 9 (L) 10/11/2021   ALT 9 10/11/2021   ANIONGAP 9 10/12/2021    GFR: Estimated Creatinine Clearance: 107.3 mL/min (by C-G formula based on SCr of 0.72 mg/dL).  Recent Results (from the past 240 hour(s))  Culture, blood (Routine X 2) w Reflex to ID Panel     Status: Abnormal (Preliminary result)   Collection Time: 10/11/21  2:57 AM   Specimen: BLOOD LEFT HAND  Result Value Ref Range Status   Specimen Description BLOOD LEFT HAND  Final   Special Requests   Final    AEROBIC BOTTLE ONLY Blood Culture results may not be optimal due to an excessive volume of blood received in culture bottles   Culture  Setup Time (A)  Final    YEAST AEROBIC BOTTLE ONLY Organism ID to follow    Culture   Final    NO GROWTH 1 DAY Performed at [facility], [address] [place], [place] [address]    Report Status PENDING  Incomplete  Culture, blood (Routine X 2) w Reflex to ID Panel     Status: None (Preliminary result)   Collection Time: 10/11/21  2:57 AM   Specimen: BLOOD LEFT HAND  Result Value Ref Range Status   Specimen Description BLOOD LEFT HAND  Final   Special Requests   Final    AEROBIC BOTTLE ONLY Blood Culture results may not be optimal due to an excessive volume of blood received in culture bottles   Culture   Final    NO GROWTH 1 DAY Performed at [facility], [address], [place], [place] [address]    Report Status PENDING  Incomplete  MRSA Next Gen by PCR, Nasal     Status: None   Collection Time: 10/11/21  9:58 AM   Specimen: Nasal Mucosa; Nasal Swab  Result Value Ref Range  Status   MRSA by PCR Next Gen NOT DETECTED NOT DETECTED Final    Comment: (NOTE) The GeneXpert MRSA Assay (FDA approved for NASAL specimens only), is one component of a comprehensive MRSA colonization surveillance program. It is not intended to diagnose MRSA infection nor to guide or monitor treatment for MRSA infections. Test performance is not FDA approved in patients less than 38 years old. Performed at [facility], [address] [place], [place] [address]       Radiology Studies: [place] EKG SITE RITE  Result Date: 10/12/2021 If Site Rite image not attached, placement could not be confirmed due to current cardiac rhythm.     LOS: 1 day    Jacquelin Hawking, MD [facility] 10/12/2021, 5:18 PM   If 7PM-7AM, please contact night-coverage [email or web address]

## 2021-10-12 NOTE — Progress Notes (Addendum)
PHARMACY - PHYSICIAN COMMUNICATION CRITICAL VALUE ALERT - BLOOD CULTURE IDENTIFICATION (BCID)  Latoya [name] is an 36 y.o. female who presented to [facility] on 10/11/2021 with a chief complaint of saddle PE. Pt is currently receiving treatment for tricuspid valve endocarditis and with vancomycin and cefepime, and micafungin was added on 10/12/21. ID is following the patient.   Assessment:   6/8 Blood cx: 1/2 bottles Candida albacans  6/8 Blood cx gram stain: yeast with pseudohyphae consistent with Candida albacans  Name of physician (or Provider) Contacted: Dr. Cordelia Poche (primary), and Dr. Carlyle Basques (ID)  Current antibiotics:  Cefepime 2g IV q8h Micafungin 150mg  IV q24h  Vancomycin 1250mg  IV q12h   Changes to prescribed antibiotics recommended:  Patient is on recommended antibiotics - No changes needed  Results for orders placed or performed during the hospital encounter of 08/02/21  Blood Culture ID Panel (Reflexed) (Collected: 08/02/2021  2:40 PM)  Result Value Ref Range   Enterococcus faecalis NOT DETECTED NOT DETECTED   Enterococcus Faecium NOT DETECTED NOT DETECTED   Listeria monocytogenes NOT DETECTED NOT DETECTED   Staphylococcus species DETECTED (A) NOT DETECTED   Staphylococcus aureus (BCID) DETECTED (A) NOT DETECTED   Staphylococcus epidermidis NOT DETECTED NOT DETECTED   Staphylococcus lugdunensis NOT DETECTED NOT DETECTED   Streptococcus species NOT DETECTED NOT DETECTED   Streptococcus agalactiae NOT DETECTED NOT DETECTED   Streptococcus pneumoniae NOT DETECTED NOT DETECTED   Streptococcus pyogenes NOT DETECTED NOT DETECTED   A.calcoaceticus-baumannii NOT DETECTED NOT DETECTED   Bacteroides fragilis NOT DETECTED NOT DETECTED   Enterobacterales NOT DETECTED NOT DETECTED   Enterobacter cloacae complex NOT DETECTED NOT DETECTED   Escherichia coli NOT DETECTED NOT DETECTED   Klebsiella aerogenes NOT DETECTED NOT DETECTED   Klebsiella oxytoca NOT DETECTED NOT  DETECTED   Klebsiella pneumoniae NOT DETECTED NOT DETECTED   Proteus species NOT DETECTED NOT DETECTED   Salmonella species NOT DETECTED NOT DETECTED   Serratia marcescens NOT DETECTED NOT DETECTED   Haemophilus influenzae NOT DETECTED NOT DETECTED   Neisseria meningitidis NOT DETECTED NOT DETECTED   Pseudomonas aeruginosa NOT DETECTED NOT DETECTED   Stenotrophomonas maltophilia NOT DETECTED NOT DETECTED   Candida albicans NOT DETECTED NOT DETECTED   Candida auris NOT DETECTED NOT DETECTED   Candida glabrata NOT DETECTED NOT DETECTED   Candida krusei NOT DETECTED NOT DETECTED   Candida parapsilosis NOT DETECTED NOT DETECTED   Candida tropicalis NOT DETECTED NOT DETECTED   Cryptococcus neoformans/gattii NOT DETECTED NOT DETECTED   Meth resistant mecA/C and MREJ DETECTED (A) NOT [place] 10/12/2021  5:23 PM

## 2021-10-12 NOTE — TOC Initial Note (Signed)
Transition of Care [facility]) - Initial/Assessment Note    Patient Details  Name: Latoya [name] MRN: [id number] Date of Birth: 06/26/1985  Transition of Care [facility]) CM/SW Contact:    Bethena Roys, RN Phone Number: 10/12/2021, 3:48 PM  Clinical Narrative:  Risk for readmission assessment completed. Patient presented from [facility] for saddle PE-currently on IV Heparin. Case Manager will continue to follow for disposition needs as the patient progresses.              Expected Discharge Plan: Home/Self Care Barriers to Discharge: Continued Medical Work up    Expected Discharge Plan and Services Expected Discharge Plan: Home/Self Care In-house Referral: Clinical Social Work Discharge Planning Services: CM Consult Post Acute Care Choice: NA Living arrangements for the past 2 months: Single Family Home                  Prior Living Arrangements/Services Living arrangements for the past 2 months: Single Family Home   Patient language and need for interpreter reviewed:: Yes Do you feel safe going back to the place where you live?: Yes         Activities of Daily Living Home Assistive Devices/Equipment: None ADL Screening (condition at time of admission) Patient's cognitive ability adequate to safely complete daily activities?: Yes Is the patient deaf or have difficulty hearing?: No Does the patient have difficulty seeing, even when wearing glasses/contacts?: No Does the patient have difficulty concentrating, remembering, or making decisions?: No Patient able to express need for assistance with ADLs?: Yes Does the patient have difficulty dressing or bathing?: No Independently performs ADLs?: Yes (appropriate for developmental age) Does the patient have difficulty walking or climbing stairs?: No Weakness of Legs: None Weakness of Arms/Hands: None  Emotional Assessment Appearance:: Appears stated age       Alcohol / Substance Use: Not Applicable Psych  Involvement: No (comment)  Admission diagnosis:  Acute saddle pulmonary embolism (HCC) [I26.92] Patient Active Problem List   Diagnosis Date Noted   Acute saddle pulmonary embolism ([place]) 10/11/2021   Thrombocytopenia ([place]) 10/11/2021   Pneumonia of left lower lobe due to infectious organism 10/11/2021   Acute pulmonary embolism ([place]) 09/13/2021   Chronic pain 09/07/2021   Chronic lumbar pain 09/06/2021   Ambulatory dysfunction 99991111   History of lice 99991111   MRSA bacteremia 08/27/2021   Septic pulmonary embolism ([place]) 08/27/2021   History of bacterial endocarditis 08/02/2021   Microcytic hypochromic anemia 08/03/2019   MRSA (methicillin resistant staph aureus) culture positive 08/03/2019   Infection of lumbar spine ([place]) 08/03/2019   Lumbar discitis 07/31/2019   Endocarditis of tricuspid valve 07/29/2019   IV drug abuse ([place]) 07/29/2019   Opioid use disorder 04/05/2019   HCV antibody positive 04/05/2019   Iron deficiency anemia 01/10/2019   PCP:  Pcp, No Pharmacy:   [facility] [address] [place] [address] Phone: [phone] Fax: [phone] [place], [place] - [address] [place] [address] Phone: [phone] Fax: [phone]   Readmission Risk Interventions    10/12/2021    3:43 PM 08/17/2021   11:31 AM  Readmission Risk Prevention Plan  Transportation Screening Complete Complete  PCP or Specialist Appt within 5-7 Days  Complete  Home Care Screening  Complete  Medication Review (RN CM)  Complete  HRI or Home Care Consult Complete   Social Work Consult for [place] Planning/Counseling Complete   Palliative Care Screening Not Applicable   Medication Review (  RN Care Manager) Referral to Pharmacy

## 2021-10-13 ENCOUNTER — Encounter (HOSPITAL_COMMUNITY): Payer: Self-pay | Admitting: Internal Medicine

## 2021-10-13 DIAGNOSIS — J189 Pneumonia, unspecified organism: Secondary | ICD-10-CM | POA: Diagnosis not present

## 2021-10-13 DIAGNOSIS — E871 Hypo-osmolality and hyponatremia: Secondary | ICD-10-CM

## 2021-10-13 DIAGNOSIS — R0781 Pleurodynia: Secondary | ICD-10-CM | POA: Diagnosis not present

## 2021-10-13 DIAGNOSIS — M4646 Discitis, unspecified, lumbar region: Secondary | ICD-10-CM | POA: Diagnosis not present

## 2021-10-13 DIAGNOSIS — D696 Thrombocytopenia, unspecified: Secondary | ICD-10-CM

## 2021-10-13 DIAGNOSIS — R7881 Bacteremia: Secondary | ICD-10-CM | POA: Diagnosis not present

## 2021-10-13 DIAGNOSIS — B9562 Methicillin resistant Staphylococcus aureus infection as the cause of diseases classified elsewhere: Secondary | ICD-10-CM | POA: Diagnosis not present

## 2021-10-13 DIAGNOSIS — F1910 Other psychoactive substance abuse, uncomplicated: Secondary | ICD-10-CM | POA: Diagnosis not present

## 2021-10-13 DIAGNOSIS — B377 Candidal sepsis: Secondary | ICD-10-CM | POA: Diagnosis not present

## 2021-10-13 DIAGNOSIS — I2692 Saddle embolus of pulmonary artery without acute cor pulmonale: Secondary | ICD-10-CM | POA: Diagnosis not present

## 2021-10-13 DIAGNOSIS — I2602 Saddle embolus of pulmonary artery with acute cor pulmonale: Secondary | ICD-10-CM | POA: Diagnosis not present

## 2021-10-13 DIAGNOSIS — I079 Rheumatic tricuspid valve disease, unspecified: Secondary | ICD-10-CM | POA: Diagnosis not present

## 2021-10-13 HISTORY — DX: Pleurodynia: R07.81

## 2021-10-13 LAB — HEPARIN LEVEL (UNFRACTIONATED)
Heparin Unfractionated: 0.3 IU/mL (ref 0.30–0.70)
Heparin Unfractionated: 0.27 IU/mL — ABNORMAL LOW (ref 0.30–0.70)

## 2021-10-13 LAB — CBC
HCT: 27.1 % — ABNORMAL LOW (ref 36.0–46.0)
Hemoglobin: 7.9 g/dL — ABNORMAL LOW (ref 12.0–15.0)
MCH: 23.3 pg — ABNORMAL LOW (ref 26.0–34.0)
MCHC: 29.2 g/dL — ABNORMAL LOW (ref 30.0–36.0)
MCV: 79.9 fL — ABNORMAL LOW (ref 80.0–100.0)
Platelets: 139 10*3/uL — ABNORMAL LOW (ref 150–400)
RBC: 3.39 MIL/uL — ABNORMAL LOW (ref 3.87–5.11)
RDW: 20.3 % — ABNORMAL HIGH (ref 11.5–15.5)
WBC: 17.4 10*3/uL — ABNORMAL HIGH (ref 4.0–10.5)
nRBC: 0.1 % (ref 0.0–0.2)

## 2021-10-13 LAB — BASIC METABOLIC PANEL
Anion gap: 11 (ref 5–15)
BUN: 8 mg/dL (ref 6–20)
CO2: 19 mmol/L — ABNORMAL LOW (ref 22–32)
Calcium: 8 mg/dL — ABNORMAL LOW (ref 8.9–10.3)
Chloride: 99 mmol/L (ref 98–111)
Creatinine, Ser: 0.7 mg/dL (ref 0.44–1.00)
GFR, Estimated: >60 mL/min (ref >60)
Glucose, Bld: 127 mg/dL — ABNORMAL HIGH (ref 70–99)
Potassium: 3.5 mmol/L (ref 3.5–5.1)
Sodium: 129 mmol/L — ABNORMAL LOW (ref 135–145)

## 2021-10-13 LAB — VANCOMYCIN, TROUGH: Vancomycin Tr: 11 ug/mL — ABNORMAL LOW (ref 15–20)

## 2021-10-13 LAB — VANCOMYCIN, PEAK: Vancomycin Pk: 16 ug/mL — ABNORMAL LOW (ref 30–40)

## 2021-10-13 MED ORDER — HYDROCODONE BIT-HOMATROP MBR 5-1.5 MG/5ML PO SOLN
5.0000 mL | ORAL | Status: DC | PRN
  Administered 2021-10-13 – 2021-10-16 (×6): 5 mL via ORAL
  Filled 2021-10-13 (×10): qty 5

## 2021-10-13 MED ORDER — IBUPROFEN 600 MG PO TABS
600.0000 mg | ORAL_TABLET | Freq: Four times a day (QID) | ORAL | Status: DC | PRN
  Administered 2021-10-13 – 2021-10-16 (×3): 600 mg via ORAL
  Filled 2021-10-13 (×3): qty 1

## 2021-10-13 MED ORDER — OXYCODONE-ACETAMINOPHEN 5-325 MG PO TABS
1.0000 | ORAL_TABLET | Freq: Four times a day (QID) | ORAL | Status: DC | PRN
  Administered 2021-10-13 – 2021-10-14 (×3): 2 via ORAL
  Administered 2021-10-14: 1 via ORAL
  Administered 2021-10-14 – 2021-10-16 (×6): 2 via ORAL
  Administered 2021-10-16: 1 via ORAL
  Administered 2021-10-16 – 2021-10-18 (×8): 2 via ORAL
  Administered 2021-10-18: 1 via ORAL
  Administered 2021-10-18 – 2021-10-20 (×6): 2 via ORAL
  Administered 2021-10-20: 1 via ORAL
  Administered 2021-10-20 – 2021-10-23 (×10): 2 via ORAL
  Filled 2021-10-13 (×39): qty 2

## 2021-10-13 MED ORDER — BENZONATATE 100 MG PO CAPS
200.0000 mg | ORAL_CAPSULE | Freq: Three times a day (TID) | ORAL | Status: AC
  Administered 2021-10-13 – 2021-10-22 (×29): 200 mg via ORAL
  Filled 2021-10-13 (×30): qty 2

## 2021-10-13 MED ORDER — VANCOMYCIN HCL 1500 MG/300ML IV SOLN
1500.0000 mg | Freq: Two times a day (BID) | INTRAVENOUS | Status: DC
  Administered 2021-10-14 – 2021-10-17 (×8): 1500 mg via INTRAVENOUS
  Filled 2021-10-13 (×8): qty 300

## 2021-10-13 NOTE — Assessment & Plan Note (Addendum)
Secondary to PE and persistent coughing. -continue Tussionex PRN -limit IV meds -Continue tessalon perles

## 2021-10-13 NOTE — Progress Notes (Signed)
Pharmacy Antibiotic Note  Latoya [name] is a 36 y.o. female admitted on 10/11/2021 with CP and back pain. Pt noted to have acute PE. Pt has significant history of MSSA and MRSA bacteremia c/b endocarditis and lumbar discitis. Pt recently completed 6 weeks of vancomycin and was started on linezolid after. Pharmacy has been consulted for vancomycin and cefepime dosing. Micafungin ordered as well with C. Albicans growing in blood. ID following. Vancomycin levels today demonstrate low AUC 390.  Plan: Increase vancomycin to 1500mg  IV q12h - est AUC 468 Cefepime 2g IV q8h  Height: 5\' 6"  (167.6 cm) Weight: 82.6 kg (182 lb 3.2 oz) IBW/kg (Calculated) : 59.3  Temp (24hrs), Avg:100.1 F (37.8 C), Min:98.7 F (37.1 C), Max:102.4 F (39.1 C)  Recent Labs  Lab 10/11/21 0257 10/12/21 0542 10/13/21 0553 10/13/21 1211  WBC 16.1* 19.4* 17.4*  --   CREATININE 0.70 0.72 0.70  --   LATICACIDVEN 1.8  --   --   --   VANCOTROUGH  --   --   --  11*  VANCOPEAK  --   --  16*  --      Estimated Creatinine Clearance: 107.3 mL/min (by C-G formula based on SCr of 0.7 mg/dL).    Allergies  Allergen Reactions   Naltrexone Anxiety    Severe anxiety Restlessness  Vomiting     Antimicrobials this admission: Vancomycin 6/7 >>  Cefepime 6/7 >>  Micafungin 6/8 >>  Microbiology results: 6/7 BCx: C. Albicans 6/7 MRSA PCR: negative  Thank you for allowing pharmacy to be a part of this patient's care.   8/7, PharmD, BCPS, [facility] Clinical Pharmacist [phone] Please check AMION for all [facility] Pharmacy numbers 10/13/2021

## 2021-10-13 NOTE — Consult Note (Addendum)
[facility]  Telephone:([phone] Fax:(336) [phone]    INITIAL HEMATOLOGY CONSULTATION  Referring MD: Dr. Jacquelin Hawking  Reason for Referral: Acute saddle pulmonary embolism  HPI: Latoya [name] is a 36 year old female with a past medical history significant for IV drug use, MSSA tricuspid valve endocarditis in 2020 (treated with angio vac and course of cefazolin) MRSA L4-L5 facet joint septic discitis in March 2021, recent diagnosis of MSRA bacteremia with infective endocarditis and tricuspid valve vegetation diagnosed March 2023.  The patient initially presented to [facility] due to chest and back pain.  CTA chest revealed a new large saddle pulmonary embolus separate from the known septic pulmonary emboli which actually looks somewhat improved.  There was evidence of right heart strain.  Echocardiogram performed confirmed decreased right ventricular function and substantial pulmonary hypertension.  She was transferred to [facility].  She had a prolonged hospitalization at [facility] from 08/02/2021 through 09/17/2021.  She was diagnosed with MRSA bacteremia with tricuspid valve vegetation.  Angio VAC was planned on 08/11/2021 but a PFO was discovered and the procedure was aborted.  She remained hospitalized for a 6-week course of inpatient antibiotics due to recent IV drug use.  Today, the patient reports a headache.  She also reports some discomfort in her lungs when she takes a deep breath.  She is currently on a heparin drip and denies bleeding.  She continues to have fevers.  She is not having any chest discomfort, abdominal pain, nausea, vomiting.  No significant lower extremity edema.  The patient is currently living with her parents in [place].  No drug use since March 29th of this year.  Denies family history of VTE.  Hematology was asked see the patient make recommendations regarding anticoagulation.  Past Medical History:  Diagnosis Date   Heroin addiction (HCC)     IV drug abuse (HCC)   :  Past Surgical History:  Procedure Laterality Date   APPLICATION OF ANGIOVAC N/A 08/11/2021   Procedure: ABORTED APPLICATION OF ANGIOVAC;  Surgeon: Corliss Skains, MD;  Location: [facility] OR;  Service: Vascular;  Laterality: N/A;   IR LUMBAR DISC ASPIRATION W/IMG GUIDE  07/29/2019   TEE WITHOUT CARDIOVERSION N/A 08/08/2021   Procedure: TRANSESOPHAGEAL ECHOCARDIOGRAM (TEE);  Surgeon: Wendall Stade, MD;  Location: [facility] ENDOSCOPY;  Service: Cardiovascular;  Laterality: N/A;   TEE WITHOUT CARDIOVERSION N/A 08/11/2021   Procedure: TRANSESOPHAGEAL ECHOCARDIOGRAM (TEE);  Surgeon: Corliss Skains, MD;  Location: [facility] OR;  Service: Open Heart Surgery;  Laterality: N/A;  :  CURRENT MEDS: Current Facility-Administered Medications  Medication Dose Route Frequency Provider Last Rate Last Admin   acetaminophen (TYLENOL) tablet 650 mg  650 mg Oral Q6H PRN Marinda Elk, MD   650 mg at 10/12/21 1154   Or   acetaminophen (TYLENOL) suppository 650 mg  650 mg Rectal Q6H PRN Shalhoub, Deno Lunger, MD       ceFEPIme (MAXIPIME) 2 g in sodium chloride 0.9 % 100 mL IVPB  2 g Intravenous Q8H Mosetta Anis, RPH 200 mL/hr at 10/13/21 0604 2 g at 10/13/21 0604   ferrous gluconate (FERGON) tablet 324 mg  324 mg Oral Q breakfast Marinda Elk, MD   324 mg at 10/12/21 1011   gabapentin (NEURONTIN) capsule 300 mg  300 mg Oral TID Marinda Elk, MD   300 mg at 10/12/21 2147   guaiFENesin-dextromethorphan (ROBITUSSIN DM) 100-10 MG/5ML syrup 5 mL  5 mL Oral Q4H PRN Narda Bonds, MD  5 mL at 10/12/21 2308   heparin ADULT infusion 100 units/mL (25000 units/225mL)  2,650 Units/hr Intravenous Continuous Mosetta Anis, RPH 26.5 mL/hr at 10/13/21 1610 2,650 Units/hr at 10/13/21 9604   menthol-cetylpyridinium (CEPACOL) lozenge 3 mg  1 lozenge Oral PRN Olalere, Adewale A, MD       micafungin (MYCAMINE) 150 mg in sodium chloride 0.9 % 100 mL IVPB  150 mg Intravenous Q24H Judyann Munson, MD 115 mL/hr at 10/12/21 1712 150 mg at 10/12/21 1712   morphine (PF) 2 MG/ML injection 2 mg  2 mg Intravenous Q4H PRN Narda Bonds, MD   2 mg at 10/13/21 0600   ondansetron (ZOFRAN) tablet 4 mg  4 mg Oral Q6H PRN Marinda Elk, MD       Or   ondansetron [facility]) injection 4 mg  4 mg Intravenous Q6H PRN Shalhoub, Deno Lunger, MD       oxyCODONE-acetaminophen (PERCOCET/ROXICET) 5-325 MG per tablet 1-2 tablet  1-2 tablet Oral Q4H PRN Narda Bonds, MD   2 tablet at 10/13/21 0318   pantoprazole (PROTONIX) EC tablet 40 mg  40 mg Oral Daily Shalhoub, Deno Lunger, MD   40 mg at 10/12/21 1011   polyethylene glycol (MIRALAX / GLYCOLAX) packet 17 g  17 g Oral Daily PRN Shalhoub, Deno Lunger, MD       pregabalin (LYRICA) capsule 75 mg  75 mg Oral TID Marinda Elk, MD   75 mg at 10/12/21 2147   vancomycin (VANCOREADY) IVPB 1250 mg/250 mL  1,250 mg Intravenous Q12H Mosetta Anis, RPH 166.7 mL/hr at 10/12/21 2311 1,250 mg at 10/12/21 2311    Allergies  Allergen Reactions   Naltrexone Anxiety    Severe anxiety Restlessness  Vomiting   : Family History  Problem Relation Age of Onset   Healthy Mother   : Social History   Socioeconomic History   Marital status: Single    Spouse name: Not on file   Number of children: Not on file   Years of education: Not on file   Highest education level: Not on file  Occupational History   Not on file  Tobacco Use   Smoking status: Light Smoker    Types: Cigarettes   Smokeless tobacco: Never  Vaping Use   Vaping Use: Never used  Substance and Sexual Activity   Alcohol use: Yes   Drug use: Not Currently    Types: Heroin    Comment: Heroin    Sexual activity: Yes  Other Topics Concern   Not on file  Social History Narrative   Lives with her parents and her two girls who are 28 and 35 years old.    Social Determinants of Health   Financial Resource Strain: Not on file  Food Insecurity: Not on file  Transportation Needs:  Not on file  Physical Activity: Not on file  Stress: Not on file  Social Connections: Not on file  Intimate Partner Violence: Not on file  :  REVIEW OF SYSTEMS:  A comprehensive 14 point review of systems was negative except as noted in the HPI.    Exam: Patient Vitals for the past 24 hrs:  BP Temp Temp src Pulse Resp SpO2  10/13/21 0420 132/81 99.6 F (37.6 C) Oral (!) 110 18 100 %  10/13/21 0004 (!) 146/80 99.8 F (37.7 C) Oral (!) 107 20 --  10/12/21 1624 125/73 99.9 F (37.7 C) Oral (!) 108 16 100 %  10/12/21 1139 121/72 Marland Kitchen)  101.5 F (38.6 C) Oral (!) 108 16 100 %    Physical Exam Vitals reviewed.  Constitutional:      General: She is not in acute distress. HENT:     Head: Normocephalic.     Mouth/Throat:     Pharynx: No oropharyngeal exudate or posterior oropharyngeal erythema.  Eyes:     General: No scleral icterus.    Conjunctiva/sclera: Conjunctivae normal.  Cardiovascular:     Rate and Rhythm: Normal rate.     Heart sounds: Murmur heard.  Pulmonary:     Effort: No respiratory distress.     Breath sounds: Normal breath sounds.  Abdominal:     Palpations: Abdomen is soft.     Tenderness: There is no abdominal tenderness.  Skin:    Comments: Multiple ecchymoses over her bilateral legs and arms.  Neurological:     Mental Status: She is alert and oriented to person, place, and time.    LABS:  Lab Results  Component Value Date   WBC 17.4 (H) 10/13/2021   HGB 7.9 (L) 10/13/2021   HCT 27.1 (L) 10/13/2021   PLT 139 (L) 10/13/2021   GLUCOSE 127 (H) 10/13/2021   ALT 9 10/11/2021   AST 9 (L) 10/11/2021   NA 129 (L) 10/13/2021   K 3.5 10/13/2021   CL 99 10/13/2021   CREATININE 0.70 10/13/2021   BUN 8 10/13/2021   CO2 19 (L) 10/13/2021   INR 1.3 (H) 10/11/2021   HGBA1C 5.1 08/22/2021    [place] EKG SITE RITE  Result Date: 10/12/2021 If Site Rite image not attached, placement could not be confirmed due to current cardiac rhythm.  ECHOCARDIOGRAM  COMPLETE  Result Date: 10/09/2021    ECHOCARDIOGRAM REPORT   Patient Name:   Latoya [name] Date of Exam: 10/09/2021 Medical Rec #:  [id number]      Height:       66.0 in Accession #:    [id number]     Weight:       185.2 lb Date of Birth:  Oct 15, 1985     BSA:          1.936 m Patient Age:    35 years       BP:           105/74 mmHg Patient Gender: F              HR:           100 bpm. Exam Location:  Outpatient Procedure: 2D Echo, Cardiac Doppler and Color Doppler Indications:    Endocarditis I38  History:        Patient has prior history of Echocardiogram examinations, most                 recent 09/13/2021.  Sonographer:    Eulah Pont RDCS Referring Phys: [id number] TRUNG T VU IMPRESSIONS  1. Left ventricular ejection fraction, by estimation, is 60 to 65%. The left ventricle has normal function. The left ventricle has no regional wall motion abnormalities. Left ventricular diastolic parameters were normal.  2. Right ventricular systolic function is normal. The right ventricular size is moderately enlarged. There is mildly elevated pulmonary artery systolic pressure.  3. Right atrial size was moderately dilated.  4. A small pericardial effusion is present.  5. The mitral valve is grossly normal. No evidence of mitral valve regurgitation. No evidence of mitral stenosis.  6. Mobile echodensity again visualized, 1.9 cm by 0.8 cm. The tricuspid valve is abnormal. Tricuspid valve regurgitation  is severe.  7. The aortic valve is tricuspid. Aortic valve regurgitation is not visualized. No aortic stenosis is present.  8. The inferior vena cava is dilated in size with >50% respiratory variability, suggesting right atrial pressure of 8 mmHg. Comparison(s): Prior images reviewed side by side. Conclusion(s)/Recommendation(s): Tricuspid valve endocarditis persists, with severe TR and moderate RV dilation. FINDINGS  Left Ventricle: Left ventricular ejection fraction, by estimation, is 60 to 65%. The left ventricle has normal  function. The left ventricle has no regional wall motion abnormalities. The left ventricular internal cavity size was normal in size. There is  no left ventricular hypertrophy. Left ventricular diastolic parameters were normal. Right Ventricle: The right ventricular size is moderately enlarged. Right vetricular wall thickness was not well visualized. Right ventricular systolic function is normal. There is mildly elevated pulmonary artery systolic pressure. The tricuspid regurgitant velocity is 2.99 m/s, and with an assumed right atrial pressure of 8 mmHg, the estimated right ventricular systolic pressure is 43.8 mmHg. Left Atrium: Left atrial size was normal in size. Right Atrium: Right atrial size was moderately dilated. Pericardium: A small pericardial effusion is present. Mitral Valve: The mitral valve is grossly normal. No evidence of mitral valve regurgitation. No evidence of mitral valve stenosis. Tricuspid Valve: Mobile echodensity again visualized, 1.9 cm by 0.8 cm. The tricuspid valve is abnormal. Tricuspid valve regurgitation is severe. Aortic Valve: The aortic valve is tricuspid. Aortic valve regurgitation is not visualized. No aortic stenosis is present. Pulmonic Valve: The pulmonic valve was not well visualized. Pulmonic valve regurgitation is trivial. No evidence of pulmonic stenosis. Aorta: The aortic root, ascending aorta, aortic arch and descending aorta are all structurally normal, with no evidence of dilitation or obstruction. Venous: The inferior vena cava is dilated in size with greater than 50% respiratory variability, suggesting right atrial pressure of 8 mmHg. IAS/Shunts: The interatrial septum was not well visualized.  LEFT VENTRICLE PLAX 2D LVIDd:         4.40 cm     Diastology LVIDs:         3.30 cm     LV e' medial:    15.00 cm/s LV PW:         1.00 cm     LV E/e' medial:  4.1 LV IVS:        0.90 cm     LV e' lateral:   17.00 cm/s LVOT diam:     2.30 cm     LV E/e' lateral: 3.6 LV SV:          52 LV SV Index:   27 LVOT Area:     4.15 cm  LV Volumes (MOD) LV vol d, MOD A2C: 94.8 ml LV vol d, MOD A4C: 47.3 ml LV vol s, MOD A2C: 55.1 ml LV vol s, MOD A4C: 27.0 ml LV SV MOD A2C:     39.7 ml LV SV MOD A4C:     47.3 ml LV SV MOD BP:      31.8 ml RIGHT VENTRICLE RV Basal diam:  4.85 cm RV S prime:     24.40 cm/s TAPSE (M-mode): 2.4 cm LEFT ATRIUM             Index        RIGHT ATRIUM           Index LA diam:        3.50 cm 1.81 cm/m   RA Area:     23.60 cm LA Vol (A2C):  41.2 ml 21.29 ml/m  RA Volume:   81.90 ml  42.31 ml/m LA Vol (A4C):   35.8 ml 18.50 ml/m LA Biplane Vol: 41.6 ml 21.49 ml/m  AORTIC VALVE LVOT Vmax:   80.90 cm/s LVOT Vmean:  54.400 cm/s LVOT VTI:    0.126 m  AORTA Ao Root diam: 3.30 cm Ao Asc diam:  3.20 cm MITRAL VALVE               TRICUSPID VALVE MV Area (PHT): 5.42 cm    TR Peak grad:   35.8 mmHg MV Decel Time: 140 msec    TR Vmax:        299.00 cm/s MV E velocity: 61.30 cm/s MV A velocity: 50.70 cm/s  SHUNTS MV E/A ratio:  1.21        Systemic VTI:  0.13 m                            Systemic Diam: 2.30 cm Jodelle RedBridgette Christopher MD Electronically signed by Jodelle RedBridgette Christopher MD Signature Date/Time: 10/09/2021/3:24:55 PM    Final    MR LUMBAR SPINE WO CONTRAST  Result Date: 10/08/2021 CLINICAL DATA:  Initial evaluation for low back pain, infection suspected. Recent MRSA bacteremia. EXAM: MRI LUMBAR SPINE WITHOUT CONTRAST TECHNIQUE: Multiplanar, multisequence MR imaging of the lumbar spine was performed. No intravenous contrast was administered. COMPARISON:  Prior MRI from 08/02/2021. FINDINGS: Segmentation: Examination degraded by motion artifact. Standard.  Lowest well-formed disc space labeled the L5-S1 level. Alignment: Physiologic with preservation of the normal lumbar lordosis. No listhesis. Vertebrae: Vertebral body height maintained without acute or chronic fracture. Bone marrow signal intensity diffusely decreased on T1 weighted sequence, nonspecific, but most  commonly related to anemia, smoking or obesity. Small probable atypical hemangioma noted within the L3 vertebral body, stable. No other discrete or worrisome osseous lesions. Mild reactive marrow edema present about the L4-5 facets bilaterally, indeterminate, and could be related to facet osteoarthritis. Possible septic arthritis not entirely excluded. No other convincing evidence for osteomyelitis discitis elsewhere within the lumbar spine. Conus medullaris and cauda equina: Conus extends to the L1 level. Conus and cauda equina appear normal. Paraspinal and other soft tissues: Diffuse edema present within the lower posterior paraspinous soft tissues, adjacent to the L4-5 and L5-S1 facets, greater on the left. No soft tissue collections. Disc levels: T12-L1: Small right paracentral disc protrusion flattens the right ventral thecal sac. Mild right-sided facet hypertrophy. No spinal stenosis. Foramina remain patent. L1-2:  Unremarkable. L2-3:  Unremarkable. L3-4:  Unremarkable. L4-5: Small central disc protrusion with caudad angulation minimally indents the ventral thecal sac. Mild to moderate facet hypertrophy with associated reactive marrow edema. No significant spinal stenosis. Foramina remain patent. L5-S1: Normal interspace. Mild to moderate right worse than left facet hypertrophy. No significant spinal stenosis. Foramina remain patent. IMPRESSION: 1. Mild reactive marrow edema about the L4-5 facets bilaterally. While these findings could be secondary to facet osteoarthritis, possible septic arthritis could be considered in this clinical setting. Associated soft tissue edema within the lower posterior paraspinous soft tissues. No soft tissue collections. 2. Small disc protrusions at L4-5 and T12-L1 without stenosis. Electronically Signed   By: Rise MuBenjamin  McClintock M.D.   On: 10/08/2021 01:48   VAS US LOWER EXTREMITY VENOUS (DVT)  Result Date: 09/13/2021  Lower Venous DVT Study Patient Name:  Yates DecampHEATHER [name]   Date of Exam:   09/13/2021 Medical Rec #: [id number]       Accession #:  [id number] Date of Birth: 08/19/1985      Patient Gender: F Patient Age:   5 years Exam Location:  [facility] Procedure:      VAS [place] LOWER EXTREMITY VENOUS (DVT) Referring Phys: A POWELL JR --------------------------------------------------------------------------------  Indications: Pulmonary embolism.  Comparison Study: 03-29-2019 Left lower extremity venous was negative for DVT. Performing Technologist: Jean Rosenthal RDMS, RVT  Examination Guidelines: A complete evaluation includes B-mode imaging, spectral Doppler, color Doppler, and power Doppler as needed of all accessible portions of each vessel. Bilateral testing is considered an integral part of a complete examination. Limited examinations for reoccurring indications may be performed as noted. The reflux portion of the exam is performed with the patient in reverse Trendelenburg.  +---------+---------------+---------+-----------+----------+--------------+ RIGHT    CompressibilityPhasicitySpontaneityPropertiesThrombus Aging +---------+---------------+---------+-----------+----------+--------------+ CFV      Full           Yes      Yes                                 +---------+---------------+---------+-----------+----------+--------------+ SFJ      Full                                                        +---------+---------------+---------+-----------+----------+--------------+ FV Prox  Full                                                        +---------+---------------+---------+-----------+----------+--------------+ FV Mid   Full                                                        +---------+---------------+---------+-----------+----------+--------------+ FV DistalFull                                                        +---------+---------------+---------+-----------+----------+--------------+ PFV      Full                                                         +---------+---------------+---------+-----------+----------+--------------+ POP      Full           Yes      Yes                                 +---------+---------------+---------+-----------+----------+--------------+ PTV      Full                                                        +---------+---------------+---------+-----------+----------+--------------+  PERO     Full                                                        +---------+---------------+---------+-----------+----------+--------------+ Gastroc  Full                                                        +---------+---------------+---------+-----------+----------+--------------+   +---------+---------------+---------+-----------+----------+--------------+ LEFT     CompressibilityPhasicitySpontaneityPropertiesThrombus Aging +---------+---------------+---------+-----------+----------+--------------+ CFV      Full           Yes      Yes                                 +---------+---------------+---------+-----------+----------+--------------+ SFJ      Full                                                        +---------+---------------+---------+-----------+----------+--------------+ FV Prox  Full                                                        +---------+---------------+---------+-----------+----------+--------------+ FV Mid   Full                                                        +---------+---------------+---------+-----------+----------+--------------+ FV DistalFull                                                        +---------+---------------+---------+-----------+----------+--------------+ PFV      Full                                                        +---------+---------------+---------+-----------+----------+--------------+ POP      Full           Yes      Yes                                  +---------+---------------+---------+-----------+----------+--------------+ PTV      Full                                                        +---------+---------------+---------+-----------+----------+--------------+  PERO     Full                                                        +---------+---------------+---------+-----------+----------+--------------+ Gastroc  Full                                                        +---------+---------------+---------+-----------+----------+--------------+     Summary: RIGHT: - There is no evidence of deep vein thrombosis in the lower extremity.  - No cystic structure found in the popliteal fossa.  LEFT: - There is no evidence of deep vein thrombosis in the lower extremity.  - No cystic structure found in the popliteal fossa.  *See table(s) above for measurements and observations. Electronically signed by Gerarda Fraction on 09/13/2021 at 9:32:03 PM.    Final    ECHOCARDIOGRAM LIMITED  Result Date: 09/13/2021    ECHOCARDIOGRAM LIMITED REPORT   Patient Name:   Latoya [name] Date of Exam: 09/13/2021 Medical Rec #:  [id number]      Height:       66.0 in Accession #:    [id number]     Weight:       192.9 lb Date of Birth:  [birth year]     BSA:          1.969 m Patient Age:    35 years       BP:           112/73 mmHg Patient Gender: F              HR:           98 bpm. Exam Location:  Inpatient Procedure: Limited Echo, Color Doppler and Cardiac Doppler Indications:    Other abnormalities of the heart  History:        Patient has prior history of Echocardiogram examinations, most                 recent 09/11/2021. Endocarditis.  Sonographer:    Rodrigo Ran RCS Referring Phys: [phone] A CALDWELL POWELL JR IMPRESSIONS  1. A large, highly mobile tricuspid valve vegetation is present and measures 2.1cmx1.2cm. The vegetation appears to be attached to the anterior leaflet of the tricuspid valve. There is severe tricuspid regurgitation with high suspicion of  leaflet perforation. Compared to prior TTE on 09/11/21, the vegetation is much more clearly demonstrated and is larger in size.  2. Left ventricular ejection fraction, by estimation, is 60 to 65%. The left ventricle has normal function. There is the interventricular septum is flattened in diastole ('D' shaped left ventricle), consistent with right ventricular volume overload.  3. Right ventricular systolic function is normal. The right ventricular size is moderately enlarged.  4. The mitral valve is normal in structure.  5. The aortic valve is tricuspid. Aortic valve regurgitation is not visualized. No aortic stenosis is present.  6. The inferior vena cava is dilated in size with <50% respiratory variability, suggesting right atrial pressure of 15 mmHg. Comparison(s): Compared to prior TTE on 09/11/21, a tricuspid valve vegetation is again visualized and appears larger on current study. The RV appears enlarged with evidence of RV volume overload. FINDINGS  Left Ventricle: Left ventricular ejection fraction, by estimation, is 60 to 65%. The left ventricle has normal function. The left ventricular internal cavity size was normal in size. There is no left ventricular hypertrophy. The interventricular septum is flattened in diastole ('D' shaped left ventricle), consistent with right ventricular volume overload. Right Ventricle: The right ventricular size is moderately enlarged. Right ventricular systolic function is normal. Right Atrium: Right atrial size was normal in size. Pericardium: There is no evidence of pericardial effusion. Mitral Valve: The mitral valve is normal in structure. Tricuspid Valve: A large, highly mobile tricuspid valve vegetation is present and measures 2.1cmx1.2cm. The vegetation appears to be attached to the anterior leaflet of the tricuspid valve. There is severe tricuspid regurgitation with high suspicion of leaflet perforation. Compared to prior TTE on 09/11/21, the vegetation is much more  clearly demonstrated and is larger in size. The tricuspid valve is abnormal. Tricuspid valve regurgitation is severe. Aortic Valve: The aortic valve is tricuspid. Aortic valve regurgitation is not visualized. No aortic stenosis is present. Venous: The inferior vena cava is dilated in size with less than 50% respiratory variability, suggesting right atrial pressure of 15 mmHg. LEFT VENTRICLE PLAX 2D LVIDd:         3.50 cm LVIDs:         2.40 cm LV PW:         0.90 cm LV IVS:        0.90 cm  RIGHT VENTRICLE RV Basal diam:  4.20 cm RV Mid diam:    4.20 cm RV S prime:     26.00 cm/s TAPSE (M-mode): 2.6 cm RIGHT ATRIUM           Index RA Area:     18.90 cm RA Volume:   51.50 ml  26.15 ml/m  TRICUSPID VALVE TR Peak grad:   24.6 mmHg TR Vmax:        248.00 cm/s Laurance Flatten MD Electronically signed by Laurance Flatten MD Signature Date/Time: 09/13/2021/2:10:00 PM    Final      ASSESSMENT AND PLAN:  1.  Acute saddle pulmonary embolism -CT chest 10/09/2021 performed at [facility] prior emboli in the left lower lobe pulmonary arteries, new large saddle pulmonary embolus at the bifurcation of the right pulmonary artery consistent with recurrent PE with evidence of right heart strain. 2.  Bilateral pulmonary emboli diagnosed 09/12/2021-started on Eliquis 3.  Endocarditis of the tricuspid valve 4.  Candidemia 5.  MRSA bacteremia 6.  Pneumonia of the left lower lobe 7.  IV drug use-last use 08/02/2021 8.  Microcytic anemia 9.  Leukocytosis 10.  Mild thrombocytopenia  11.  Lumbar discitis  Latoya [name] developed an acute saddle pulmonary embolism while on Eliquis.  She is currently on a heparin drip.  Due to the vegetation of her tricuspid valve, she continues to be at high risk for recurrent PE. Recommend continuation of heparin and transition to Lovenox 1 mg/kg twice daily once all procedures are completed.  Recommend evaluation by CT surgery to address her heart valve.  Heart valve replacement  could be considered if she remains off drugs for at least 6 months.  Recommendations: 1.  Continue heparin drip. 2.  Can transition to Lovenox at discharge from the hospital 3.  CT surgery evaluation to address her heart valve. 4.  Check lower extremity Dopplers 5.  Please call hematology as needed, I will check on her next week and as needed outpatient follow-up can be arranged at the [facility]  center  Thank you for this referral.  Clenton Pare, DNP, AGPCNP-BC, AOCNP   Latoya [name] was interviewed and examined.  I reviewed the admission chest CT images and echocardiogram result.  She is admitted with recurrent pulmonary embolism while on anticoagulation therapy.  The source of embolization is likely related to endocarditis with known tricuspid valve vegetation.  I will check lower extremity Dopplers to be sure she has not developed DVTs.  I think it will be difficult to prevent recurrent embolization with the ongoing tricuspid disease.  She has been seen in consultation by CVTS.  The hematologic findings are likely related to bacteremia.  I was present for greater than 50% of today's visit.  I performed medical decision making.

## 2021-10-13 NOTE — Progress Notes (Addendum)
ANTICOAGULATION CONSULT NOTE  Pharmacy Consult for Heparin  Indication: pulmonary embolus  Allergies  Allergen Reactions   Naltrexone Anxiety    Severe anxiety Restlessness  Vomiting     Patient Measurements: Height: 5\' 6"  (167.6 cm) Weight: 82.6 kg (182 lb 3.2 oz) IBW/kg (Calculated) : 59.3  Vital Signs: Temp: 99.6 F (37.6 C) (06/09 0420) Temp Source: Oral (06/09 0420) BP: 132/81 (06/09 0420) Pulse Rate: 110 (06/09 0420)  Labs: Recent Labs    10/11/21 0257 10/11/21 1139 10/12/21 0542 10/12/21 1511 10/13/21 0553  HGB 8.6*  --  8.6*  --  7.9*  HCT 28.7*  --  28.1*  --  27.1*  PLT 120*  --  130*  --  139*  APTT 50*  --   --   --   --   LABPROT 16.4*  --   --   --   --   INR 1.3*  --   --   --   --   HEPARINUNFRC 0.22*   < > 0.23* 0.27* 0.30  CREATININE 0.70  --  0.72  --   --   TROPONINIHS 7  --   --   --   --    < > = values in this interval not displayed.    Estimated Creatinine Clearance: 107.3 mL/min (by C-G formula based on SCr of 0.72 mg/dL).   Medical History: Past Medical History:  Diagnosis Date   Heroin addiction (HCC)    IV drug abuse (HCC)    Assessment: 24 yoF transferred from OSH with PE. Pt started on IV heparin drip. Pt has hx septic pulmonary emboli and reports adherence to PTA Eliquis.  Heparin level therapeutic at 0.3.  Goal of Therapy:  Heparin level 0.3-0.7 units/ml Monitor platelets by anticoagulation protocol: Yes   Plan:  Increase heparin to 2650 units/h Daily heparin level and CBC   ADDENDUM 1315: Difficult collecting blood today, so phlebotomy went ahead and collected sample for a heparin level while obtaining other labs. Heparin level slightly below goal at 0.27. Will increase to 2800 units/h, recheck in am.    20, PharmD, BCPS, [facility] Clinical Pharmacist Please check AMION for all [facility] Pharmacy numbers 10/13/2021

## 2021-10-13 NOTE — Progress Notes (Signed)
PROGRESS NOTE    Latoya [name]  ZOX:[id number] DOB: June 14, 1985 DOA: 10/11/2021 PCP: Pcp, No   Brief Narrative: Latoya DecampHeather Yusko is a 36 y.o. female with a history of MSSA tricuspid valve endocarditis, MRSA L4-L5 facet  join septic discitis in 07/2019 and more recently diagnosis of MRSA bacteremia with infective endocarditis and tricuspid valve vegetation diagnosed 07/2021. Patient [facility] for saddle pulmonary embolism. Blood cultures growing candida albicans on BCID. Patient started on micafungin. PICC placement deferred for now.    Assessment and Plan: * Acute saddle pulmonary embolism (HCC) Noted on CTA chest. Acute on chronic diagnosis while on Eliquis PO. Started on Heparin IV. RV heart strain noted on imaging. Transthoracic Echocardiogram obtained which showed stable RV dysfunction from prior. PCCM consulted with recommendations for continued anticoagulation. On room air. -Continue IV heparin -PCCM recommendations: anticoagulation -Hematology recommendations: Recommending Lovenox when no invasive interventions plan; note pending  Candidemia (HCC) New diagnosis with blood culture (6/7) resulting yeast. ID aware and have started micafungin IV. -Continue Micafungin IV  Lumbar discitis Noted on imaging. ID consulted. Neurosurgery curbsided. Recommendations for IR aspiration/biopsy. IR unable to perform secondary to no target for aspiration/biopsy. In setting of persistent fevers concerning for persistent bacteremia. -See problem, Endocarditis of tricuspid valve  Endocarditis of tricuspid valve Previously diagnosed. Initial plan for angio vac which was canceled secondary to PFO. Patient was treated for this as an inpatient and discharged on linezolid. Vancomycin and Cefepime restarted on admission. ID consulted. 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 -ID recommendations: continue Vancomycin/Cefepime, follow-up blood  cultures -TCTS consulted on 6/8 for recommendations on persistent large TV vegetation; will call again to update on new culture results  Pneumonia of left lower lobe due to infectious organism Patient with a history of septic emboli on prior admission. Patient noted to have evidence of LLL pneumonia. Started on antibiotics as mentioned in problem, Endocarditis of tricuspid valve.  MRSA bacteremia -See problem, Endocarditis of tricuspid valve  IV drug abuse (HCC) History of. Patient reports continued abstinence since March of 2023. Patient reports not requiring resources for continued abstinence.  Thrombocytopenia (HCC) Mild. In setting of infection. Stable.  Hyponatremia Mild. Possibly in setting of acute illness/pain. -BMP in AM  Pleuritic chest pain Secondary to PE and persistent coughing. -Hycodan PRN, Tessalon perles -Ibuprofen PRN     DVT prophylaxis: Heparin IV Code Status:   Code Status: Full Code Family Communication: None at bedside. Disposition Plan: Discharge SNF pending plan for continued antibiotics/antifungals and specialist final recommendations vs 6 week inpatient hospitalization   Consultants:  Infectious disease PCCM Hematology Cardiothoracic surgery  Procedures:  None  Antimicrobials: Vancomycin Cefepime Micafungin   Subjective: Patient concerned about her cough which is causing a lot of chest pain. No other concerns.  Objective: BP 100/64   Pulse (!) 110   Temp 98.7 F (37.1 C) (Oral)   Resp 18   Ht 5\' 6"  (1.676 m)   Wt 82.6 kg   SpO2 100%   BMI 29.41 kg/m   Examination:  General exam: Appears calm and comfortable Respiratory system: Mild rales R>L. Respiratory effort normal. Cardiovascular system: S1 & S2 heard, RRR. 2/6 systolic murmur Gastrointestinal system: Abdomen is nondistended, soft and nontender. Normal bowel sounds heard. Central nervous system: Alert and oriented. No focal neurological deficits. Musculoskeletal: No  calf tenderness Skin: No cyanosis. No rashes Psychiatry: Judgement and insight appear normal. Mood & affect appropriate.    Data Reviewed: I have personally reviewed  following labs and imaging studies  CBC Lab Results  Component Value Date   WBC 17.4 (H) 10/13/2021   RBC 3.39 (L) 10/13/2021   HGB 7.9 (L) 10/13/2021   HCT 27.1 (L) 10/13/2021   MCV 79.9 (L) 10/13/2021   MCH 23.3 (L) 10/13/2021   PLT 139 (L) 10/13/2021   MCHC 29.2 (L) 10/13/2021   RDW 20.3 (H) 10/13/2021   LYMPHSABS 3.1 10/11/2021   MONOABS 1.8 (H) 10/11/2021   EOSABS 0.2 10/11/2021   BASOSABS 0.1 10/11/2021     Last metabolic panel Lab Results  Component Value Date   NA 129 (L) 10/13/2021   K 3.5 10/13/2021   CL 99 10/13/2021   CO2 19 (L) 10/13/2021   BUN 8 10/13/2021   CREATININE 0.70 10/13/2021   GLUCOSE 127 (H) 10/13/2021   GFRNONAA >60 10/13/2021   GFRAA >60 08/07/2019   CALCIUM 8.0 (L) 10/13/2021   PHOS 3.5 09/13/2021   PROT 7.0 10/11/2021   ALBUMIN 2.8 (L) 10/11/2021   BILITOT 1.0 10/11/2021   ALKPHOS 45 10/11/2021   AST 9 (L) 10/11/2021   ALT 9 10/11/2021   ANIONGAP 11 10/13/2021    GFR: Estimated Creatinine Clearance: 107.3 mL/min (by C-G formula based on SCr of 0.7 mg/dL).  Recent Results (from the past 240 hour(s))  Culture, blood (Routine X 2) w Reflex to ID Panel     Status: Abnormal (Preliminary result)   Collection Time: 10/11/21  2:57 AM   Specimen: BLOOD LEFT HAND  Result Value Ref Range Status   Specimen Description BLOOD LEFT HAND  Final   Special Requests   Final    AEROBIC BOTTLE ONLY Blood Culture results may not be optimal due to an excessive volume of blood received in culture bottles   Culture  Setup Time (A)  Final    YEAST AEROBIC BOTTLE ONLY CRITICAL RESULT CALLED TO, READ BACK BY AND VERIFIED WITH: PHARMD CARLA JARDIN ON 10/12/21 @ 1723 BY DRT    Culture (A)  Final    CANDIDA ALBICANS CULTURE REINCUBATED FOR BETTER GROWTH Performed at [facility],  [address], [place], [place] [address]    Report Status PENDING  Incomplete  Culture, blood (Routine X 2) w Reflex to ID Panel     Status: None (Preliminary result)   Collection Time: 10/11/21  2:57 AM   Specimen: BLOOD LEFT HAND  Result Value Ref Range Status   Specimen Description BLOOD LEFT HAND  Final   Special Requests   Final    AEROBIC BOTTLE ONLY Blood Culture results may not be optimal due to an excessive volume of blood received in culture bottles   Culture  Setup Time   Final    YEAST WITH PSEUDOHYPHAE AEROBIC BOTTLE ONLY CRITICAL RESULT CALLED TO, READ BACK BY AND VERIFIED WITH: PHARMD CARLA JARDIN ON 10/12/21 @ 1837 BY DRT Performed at [facility], [address], [place], [place] [address]    Culture YEAST  Final   Report Status PENDING  Incomplete  Blood Culture ID Panel (Reflexed)     Status: Abnormal   Collection Time: 10/11/21  2:57 AM  Result Value Ref Range Status   Enterococcus faecalis NOT DETECTED NOT DETECTED Final   Enterococcus Faecium NOT DETECTED NOT DETECTED Final   Listeria monocytogenes NOT DETECTED NOT DETECTED Final   Staphylococcus species NOT DETECTED NOT DETECTED Final   Staphylococcus aureus (BCID) NOT DETECTED NOT DETECTED Final   Staphylococcus epidermidis NOT DETECTED NOT DETECTED Final  Staphylococcus lugdunensis NOT DETECTED NOT DETECTED Final   Streptococcus species NOT DETECTED NOT DETECTED Final   Streptococcus agalactiae NOT DETECTED NOT DETECTED Final   Streptococcus pneumoniae NOT DETECTED NOT DETECTED Final   Streptococcus pyogenes NOT DETECTED NOT DETECTED Final   A.calcoaceticus-baumannii NOT DETECTED NOT DETECTED Final   Bacteroides fragilis NOT DETECTED NOT DETECTED Final   Enterobacterales NOT DETECTED NOT DETECTED Final   Enterobacter cloacae complex NOT DETECTED NOT DETECTED Final   Escherichia coli NOT DETECTED NOT DETECTED Final   Klebsiella aerogenes NOT DETECTED NOT DETECTED Final   Klebsiella oxytoca NOT DETECTED  NOT DETECTED Final   Klebsiella pneumoniae NOT DETECTED NOT DETECTED Final   Proteus species NOT DETECTED NOT DETECTED Final   Salmonella species NOT DETECTED NOT DETECTED Final   Serratia marcescens NOT DETECTED NOT DETECTED Final   Haemophilus influenzae NOT DETECTED NOT DETECTED Final   Neisseria meningitidis NOT DETECTED NOT DETECTED Final   Pseudomonas aeruginosa NOT DETECTED NOT DETECTED Final   Stenotrophomonas maltophilia NOT DETECTED NOT DETECTED Final   Candida albicans DETECTED (A) NOT DETECTED Final    Comment: CRITICAL RESULT CALLED TO, READ BACK BY AND VERIFIED WITH: PHARMD CARLA JARDIN ON 10/12/21 @ 1723 BY DRT    Candida auris NOT DETECTED NOT DETECTED Final   Candida glabrata NOT DETECTED NOT DETECTED Final   Candida krusei NOT DETECTED NOT DETECTED Final   Candida parapsilosis NOT DETECTED NOT DETECTED Final   Candida tropicalis NOT DETECTED NOT DETECTED Final   Cryptococcus neoformans/gattii NOT DETECTED NOT DETECTED Final    Comment: Performed at [facility], [address], [place], [place] [address]  MRSA Next Gen by PCR, Nasal     Status: None   Collection Time: 10/11/21  9:58 AM   Specimen: Nasal Mucosa; Nasal Swab  Result Value Ref Range Status   MRSA by PCR Next Gen NOT DETECTED NOT DETECTED Final    Comment: (NOTE) The GeneXpert MRSA Assay (FDA approved for NASAL specimens only), is one component of a comprehensive MRSA colonization surveillance program. It is not intended to diagnose MRSA infection nor to guide or monitor treatment for MRSA infections. Test performance is not FDA approved in patients less than 49 years old. Performed at [facility], [address], [place], [place] [address]       Radiology Studies: [place] EKG SITE RITE  Result Date: 10/12/2021 If Site Rite image not attached, placement could not be confirmed due to current cardiac rhythm.     LOS: 2 days    Jacquelin Hawking, MD [facility] 10/13/2021, 10:02  AM   If 7PM-7AM, please contact night-coverage [email or web address]

## 2021-10-13 NOTE — Progress Notes (Signed)
    10/13/21 1202  Assess: MEWS Score  Temp (!) 102.4 F (39.1 C) (RN notified)  BP 120/76  MAP (mmHg) 90  Pulse Rate (!) 105  ECG Heart Rate (!) 107  Resp 20  Level of Consciousness Alert  SpO2 100 %  O2 Device Room Air  Assess: MEWS Score  MEWS Temp 2  MEWS Systolic 0  MEWS Pulse 1  MEWS RR 0  MEWS LOC 0  MEWS Score 3  MEWS Score Color Yellow  Assess: if the MEWS score is Yellow or Red  Were vital signs taken at a resting state? No  Focused Assessment No change from prior assessment  Does the patient meet 2 or more of the SIRS criteria? Yes  Does the patient have a confirmed or suspected source of infection? Yes  Provider and Rapid Response Notified? No  MEWS guidelines implemented *See Row Information* Yes  Treat  MEWS Interventions Administered prn meds/treatments  Take Vital Signs  Increase Vital Sign Frequency  Yellow: Q 2hr X 2 then Q 4hr X 2, if remains yellow, continue Q 4hrs  Escalate  MEWS: Escalate Yellow: discuss with charge nurse/RN and consider discussing with provider and RRT  Notify: Charge Nurse/RN  Name of Charge Nurse/RN Notified Brooke RN  Date Charge Nurse/RN Notified 10/13/21  Time Charge Nurse/RN Notified 1230  Notify: Provider  Provider Name/Title Jodean Lima MD  Date Provider Notified 10/13/21  Time Provider Notified 1234  Method of Notification Face-to-face  Notification Reason Other (Comment) (Yellow Mews)  Provider response No new orders  Date of Provider Response 10/13/21  Time of Provider Response 1234  Document  Patient Outcome Stabilized after interventions  Assess: SIRS CRITERIA  SIRS Temperature  1  SIRS Pulse 1  SIRS Respirations  0  SIRS WBC 1  SIRS Score Sum  3

## 2021-10-13 NOTE — Progress Notes (Signed)
     [address]       [place] [address]             [phone]       36 year old female well-known to our service she was previously evaluated for tricuspid valve endocarditis.  She was noted to have PFO Intra-Op and thus was not a candidate for angio VAC debridement.  She was readmitted with chest and back pain, and MRSA bacteremia and Candida fungemia.  Review of her echocardiogram the vegetation was present, although it is slightly smaller.  She also has left pulmonary artery, and segmental emboli.  Although her right heart is mildly enlarged on echo and CT scan, I think this is also related to severe tricuspid valve regurgitation, and not necessarily due to right heart strain given her clinical stability.  Would recommend continued anticoagulation therapy. I will discuss plans for occlusion of her PFO for consideration for angio VAC debridement at the same time.  I am out of town next week thus continue antibiotic therapy for now.   Gable Odonohue Keane Scrape

## 2021-10-13 NOTE — Progress Notes (Signed)
NAME:  Latoya [name], MRN:  [id number], DOB:  [birth year], LOS: 2 ADMISSION DATE:  10/11/2021, CONSULTATION DATE: 10/11/2021 REFERRING MD: [facility], CHIEF COMPLAINT: Recurrent pulmonary embolism, refractory pneumonia  History of Present Illness:  36 year old female with extensive past medical history IV drug abuse MSSA endocarditis now with MRSA endocarditis tricuspid regurgitation history of septic embolism and now presents with breakthrough saddle pulmonary embolism despite being on Eliquis.  He has been transferred to [facility] for infectious disease consult pulmonary critical care was asked to evaluate.  He has had an ongoing pneumonia with likely fevers despite being on antimicrobial therapy.  Questionable need for fiberoptic bronchoscopy cultures to better determine infectious process.    Pertinent  Medical History   Past Medical History:  Diagnosis Date   Heroin addiction (HCC)    IV drug abuse (HCC)     Significant Hospital Events: Including procedures, antibiotic start and stop dates in addition to other pertinent events   6/6-transferred to [facility] for management of pulmonary embolism 6/8 blood cultures with Candida, micafungin added  Interim History / Subjective:  Still with fevers Tmax 101.5  Objective   Blood pressure 100/64, pulse (!) 110, temperature 98.7 F (37.1 C), temperature source Oral, resp. rate 18, height 5\' 6"  (1.676 m), weight 82.6 kg, SpO2 100 %.        Intake/Output Summary (Last 24 hours) at 10/13/2021 0824 Last data filed at 10/12/2021 1200 Gross per 24 hour  Intake 360 ml  Output --  Net 360 ml   Filed Weights   10/11/21 0023  Weight: 82.6 kg    Examination: General: Middle-age lady, does not appear to be in extremities HENT: Moist oral mucosa Lungs: Clear breath sounds Cardiovascular: S1-S2 appreciated Abdomen: Bowel sounds appreciated Extremities: No clubbing, no edema Neuro: Alert and oriented x3 GU:    Resolved Hospital Problem  list     Assessment & Plan:   Pulmonary embolism despite compliance with Eliquis history of septic emboli -Patient remains on heparin at present -Option of treatment will be to switch to low medical weight heparin and another DOAC may be tried if no plans for any invasive intervention-  Tricuspid valve endocarditis -Patient felt not to be angio vac candidate secondary to noted PFO  Endocarditis with MRSA -Currently on vancomycin and cefepime Blood cultures did reveal Candida albicans -Micafungin added  History of lumbar discitis -Continue current antibiotics  History of IV drug use -Has been sober since March  Tricuspid valve endocarditis -Felt not to be angio vac candidate secondary to noted PFO -Follow-up opinion from cardiothoracic surgery as per Dr. April notes -Unfortunately may have a fungal endocarditis  Endocarditis with MRSA -Currently on vancomycin and cefepime  History of lumbar discitis -On antibiotics at present  History of IV drug use -States she has been sober since March  Expectorated sputum being processed -No organisms.  Best Practice (right click and "Reselect all SmartList Selections" daily)   Diet/type: Regular consistency (see orders) DVT prophylaxis: systemic heparin GI prophylaxis: PPI Lines: N/A Foley:  N/A Code Status:  full code Last date of multidisciplinary goals of care discussion [per primary]  Labs   CBC: Recent Labs  Lab 10/11/21 0257 10/12/21 0542 10/13/21 0553  WBC 16.1* 19.4* 17.4*  NEUTROABS 10.8*  --   --   HGB 8.6* 8.6* 7.9*  HCT 28.7* 28.1* 27.1*  MCV 78.6* 77.6* 79.9*  PLT 120* 130* 139*    Basic Metabolic Panel: Recent Labs  Lab 10/11/21 0257 10/12/21 0542 10/13/21  0553  NA 134* 131* 129*  K 3.7 3.4* 3.5  CL 102 101 99  CO2 23 21* 19*  GLUCOSE 89 150* 127*  BUN 7 7 8   CREATININE 0.70 0.72 0.70  CALCIUM 8.4* 7.9* 8.0*  MG 1.9  --   --    GFR: Estimated Creatinine Clearance: 107.3 mL/min (by  C-G formula based on SCr of 0.7 mg/dL). Recent Labs  Lab 10/11/21 0257 10/12/21 0542 10/13/21 0553  WBC 16.1* 19.4* 17.4*  LATICACIDVEN 1.8  --   --     Liver Function Tests: Recent Labs  Lab 10/11/21 0257  AST 9*  ALT 9  ALKPHOS 45  BILITOT 1.0  PROT 7.0  ALBUMIN 2.8*   No results for input(s): "LIPASE", "AMYLASE" in the last 168 hours. No results for input(s): "AMMONIA" in the last 168 hours.  ABG    Component Value Date/Time   PHART 7.48 (H) 08/04/2021 1610   PCO2ART 30 (L) 08/04/2021 1610   PO2ART 69 (L) 08/04/2021 1610   HCO3 22.6 08/04/2021 1610   TCO2 25 08/11/2021 1251   O2SAT 96.9 08/04/2021 1610     Coagulation Profile: Recent Labs  Lab 10/11/21 0257  INR 1.3*    Cardiac Enzymes: No results for input(s): "CKTOTAL", "CKMB", "CKMBINDEX", "TROPONINI" in the last 168 hours.  HbA1C: Hgb A1c MFr Bld  Date/Time Value Ref Range Status  08/22/2021 09:45 AM 5.1 4.8 - 5.6 % Final    Comment:    (NOTE) Pre diabetes:          5.7%-6.4%  Diabetes:              >6.4%  Glycemic control for   <7.0% adults with diabetes     CBG: No results for input(s): "GLUCAP" in the last 168 hours.  Review of Systems:   Some chest discomfort, sputum production  Past Medical History:  She,  has a past medical history of Heroin addiction (HCC) and IV drug abuse (HCC).   Surgical History:   Past Surgical History:  Procedure Laterality Date   APPLICATION OF ANGIOVAC N/A 08/11/2021   Procedure: ABORTED APPLICATION OF ANGIOVAC;  Surgeon: 10/11/2021, MD;  Location: [facility] OR;  Service: Vascular;  Laterality: N/A;   IR LUMBAR DISC ASPIRATION W/IMG GUIDE  07/29/2019   TEE WITHOUT CARDIOVERSION N/A 08/08/2021   Procedure: TRANSESOPHAGEAL ECHOCARDIOGRAM (TEE);  Surgeon: 10/08/2021, MD;  Location: [facility] ENDOSCOPY;  Service: Cardiovascular;  Laterality: N/A;   TEE WITHOUT CARDIOVERSION N/A 08/11/2021   Procedure: TRANSESOPHAGEAL ECHOCARDIOGRAM (TEE);  Surgeon: 10/11/2021, MD;  Location: [facility] OR;  Service: Open Heart Surgery;  Laterality: N/A;     Social History:   reports that she has been smoking cigarettes. She has never used smokeless tobacco. She reports current alcohol use. She reports that she does not currently use drugs after having used the following drugs: Heroin.   Family History:  Her family history includes Healthy in her mother.   Allergies Allergies  Allergen Reactions   Naltrexone Anxiety    Severe anxiety Restlessness  Vomiting      Home Medications  Prior to Admission medications   Medication Sig Start Date End Date Taking? Authorizing Provider  acetaminophen (TYLENOL) 325 MG tablet Take 650 mg by mouth every 6 (six) hours as needed for mild pain.   Yes [provider]  ferrous gluconate (FERGON) 324 MG tablet Take 1 tablet (324 mg total) by mouth daily with breakfast. 09/15/21 10/15/21 Yes 12/15/21, [place]  J, DO  gabapentin (NEURONTIN) 600 MG tablet Take 0.5 tablets (300 mg total) by mouth 3 (three) times daily. 09/15/21 10/15/21 Yes [place], Eric J, DO  linezolid (ZYVOX) 600 MG tablet Take 1 tablet (600 mg total) by mouth 2 (two) times daily. Patient taking differently: Take 600 mg by mouth 2 (two) times daily. 30 day course. 09/26/21 10/26/21 Yes Vu, Tonita Phoenixrung T, MD  pantoprazole (PROTONIX) 40 MG tablet Take 1 tablet (40 mg total) by mouth daily. 09/15/21 10/15/21 Yes [place], Eric J, DO  pregabalin (LYRICA) 75 MG capsule Take 1 capsule (75 mg total) by mouth 3 (three) times daily. 09/15/21 10/15/21 Yes [place], Alvira PhilipsEric J, DO  APIXABAN Everlene Balls(ELIQUIS) VTE STARTER PACK (10MG  AND 5MG ) Take as directed on package: start with two-5mg  tablets twice daily for 7 days. On day 8, switch to one-5mg  tablet twice daily. Patient taking differently: Take 5 mg by mouth 2 (two) times daily. 09/15/21   [place], Alvira PhilipsEric J, DO  Docusate Sodium (DSS) 100 MG CAPS Take 1 capsule (100 mg total) by mouth 2 (two) times daily as needed for mild constipation. Patient not  taking: Reported on 10/11/2021 09/15/21   [place], Alvira PhilipsEric J, DO  oxyCODONE (ROXICODONE) 5 MG immediate release tablet Take 1 tablet (5 mg total) by mouth every 8 (eight) hours as needed. Patient not taking: Reported on 09/26/2021 09/15/21 09/15/22  [place], Alvira PhilipsEric J, DO    Virl DiamondAdewale Olyvia Gopal, MD [place] P[facility]M Pager: See Loretha StaplerAmion

## 2021-10-13 NOTE — Assessment & Plan Note (Signed)
Mild. Possibly in setting of acute illness/pain. -BMP in AM

## 2021-10-14 DIAGNOSIS — I079 Rheumatic tricuspid valve disease, unspecified: Secondary | ICD-10-CM | POA: Diagnosis not present

## 2021-10-14 DIAGNOSIS — B377 Candidal sepsis: Secondary | ICD-10-CM | POA: Diagnosis not present

## 2021-10-14 DIAGNOSIS — R0781 Pleurodynia: Secondary | ICD-10-CM | POA: Diagnosis not present

## 2021-10-14 DIAGNOSIS — M4646 Discitis, unspecified, lumbar region: Secondary | ICD-10-CM | POA: Diagnosis not present

## 2021-10-14 DIAGNOSIS — I2692 Saddle embolus of pulmonary artery without acute cor pulmonale: Secondary | ICD-10-CM | POA: Diagnosis not present

## 2021-10-14 DIAGNOSIS — B9562 Methicillin resistant Staphylococcus aureus infection as the cause of diseases classified elsewhere: Secondary | ICD-10-CM | POA: Diagnosis not present

## 2021-10-14 DIAGNOSIS — R7881 Bacteremia: Secondary | ICD-10-CM | POA: Diagnosis not present

## 2021-10-14 DIAGNOSIS — D696 Thrombocytopenia, unspecified: Secondary | ICD-10-CM | POA: Diagnosis not present

## 2021-10-14 DIAGNOSIS — I2602 Saddle embolus of pulmonary artery with acute cor pulmonale: Secondary | ICD-10-CM | POA: Diagnosis not present

## 2021-10-14 DIAGNOSIS — F1910 Other psychoactive substance abuse, uncomplicated: Secondary | ICD-10-CM | POA: Diagnosis not present

## 2021-10-14 DIAGNOSIS — J189 Pneumonia, unspecified organism: Secondary | ICD-10-CM | POA: Diagnosis not present

## 2021-10-14 LAB — HEPARIN LEVEL (UNFRACTIONATED): Heparin Unfractionated: 0.27 IU/mL — ABNORMAL LOW (ref 0.30–0.70)

## 2021-10-14 LAB — CBC
HCT: 27.7 % — ABNORMAL LOW (ref 36.0–46.0)
Hemoglobin: 8.3 g/dL — ABNORMAL LOW (ref 12.0–15.0)
MCH: 23.6 pg — ABNORMAL LOW (ref 26.0–34.0)
MCHC: 30 g/dL (ref 30.0–36.0)
MCV: 78.7 fL — ABNORMAL LOW (ref 80.0–100.0)
Platelets: 145 10*3/uL — ABNORMAL LOW (ref 150–400)
RBC: 3.52 MIL/uL — ABNORMAL LOW (ref 3.87–5.11)
RDW: 20.1 % — ABNORMAL HIGH (ref 11.5–15.5)
WBC: 11.7 10*3/uL — ABNORMAL HIGH (ref 4.0–10.5)
nRBC: 0 % (ref 0.0–0.2)

## 2021-10-14 LAB — BASIC METABOLIC PANEL
Anion gap: 6 (ref 5–15)
BUN: 9 mg/dL (ref 6–20)
CO2: 22 mmol/L (ref 22–32)
Calcium: 7.8 mg/dL — ABNORMAL LOW (ref 8.9–10.3)
Chloride: 107 mmol/L (ref 98–111)
Creatinine, Ser: 0.62 mg/dL (ref 0.44–1.00)
GFR, Estimated: >60 mL/min (ref >60)
Glucose, Bld: 172 mg/dL — ABNORMAL HIGH (ref 70–99)
Potassium: 3.4 mmol/L — ABNORMAL LOW (ref 3.5–5.1)
Sodium: 135 mmol/L (ref 135–145)

## 2021-10-14 MED ORDER — ENOXAPARIN SODIUM 80 MG/0.8ML IJ SOSY
80.0000 mg | PREFILLED_SYRINGE | Freq: Two times a day (BID) | INTRAMUSCULAR | Status: DC
  Administered 2021-10-14 – 2021-10-22 (×15): 80 mg via SUBCUTANEOUS
  Filled 2021-10-14 (×20): qty 0.8

## 2021-10-14 MED ORDER — SODIUM CHLORIDE 0.9 % IV SOLN
150.0000 mg | INTRAVENOUS | Status: AC
  Administered 2021-10-14 – 2021-10-16 (×3): 150 mg via INTRAVENOUS
  Filled 2021-10-14 (×3): qty 7.5

## 2021-10-14 MED ORDER — SODIUM CHLORIDE 0.9 % IV SOLN
150.0000 mg | INTRAVENOUS | Status: DC
  Administered 2021-10-17 – 2021-10-23 (×6): 150 mg via INTRAVENOUS
  Filled 2021-10-14 (×2): qty 7.5
  Filled 2021-10-14: qty 15
  Filled 2021-10-14 (×5): qty 7.5
  Filled 2021-10-14: qty 15

## 2021-10-14 NOTE — Progress Notes (Signed)
PROGRESS NOTE    Latoya [name]  TKW:[id number] DOB: 25-Jul-1985 DOA: 10/11/2021 PCP: Pcp, No   Brief Narrative: Latoya [name] is a 36 y.o. female with a history of MSSA tricuspid valve endocarditis, MRSA L4-L5 facet  join septic discitis in 07/2019 and more recently diagnosis of MRSA bacteremia with infective endocarditis and tricuspid valve vegetation diagnosed 07/2021. Patient presented from [facility] for saddle pulmonary embolism. Blood cultures growing candida albicans. Patient started on micafungin. PICC placement deferred for now.    Assessment and Plan: * Acute saddle pulmonary embolism (HCC) Noted on CTA chest. Acute on chronic diagnosis while on Eliquis PO. Started on Heparin IV. RV heart strain noted on imaging. Transthoracic Echocardiogram obtained which showed stable RV dysfunction from prior. Complicated by TV endocarditis with history of septic emboli. PCCM consulted with recommendations for continued anticoagulation and no thrombectomy. On room air. Hematology recommending Lovenox for treatment. Heparin not therapeutic. No procedures anticipated in the near future -Discontinue IV heparin and start Lovenox  Candidemia (HCC) New diagnosis with blood culture (6/7) resulting yeast.Patient started on micafungin IV. Leukocytosis improving. -Continue micafungin IV  Lumbar discitis Noted on imaging. ID consulted. Neurosurgery curbsided. Recommendations for IR aspiration/biopsy. IR unable to perform secondary to no target for aspiration/biopsy. In setting of persistent fevers concerning for persistent bacteremia. -See problem, Endocarditis of tricuspid valve  Endocarditis of tricuspid valve Previously diagnosed. Initial plan for angio vac which was canceled secondary to PFO. Patient was treated for this as an inpatient and discharged on linezolid. Vancomycin and Cefepime restarted on admission. ID consulted. Blood cultures significant for candida albicans, raising concern for  candida endocarditis. -ID recommendations: continue Vancomycin/Cefepime, follow-up blood cultures -TCTS consulted on 6/8 for recommendations on persistent large TV vegetation. Plan to consider occlusion of her PFO for consideration of angio VAC debridement  Pneumonia of left lower lobe due to infectious organism Patient with a history of septic emboli on prior admission. Patient noted to have evidence of LLL pneumonia. Started on antibiotics as mentioned in problem, Endocarditis of tricuspid valve.  MRSA bacteremia -See problem, Endocarditis of tricuspid valve  IV drug abuse (HCC) History of. Patient reports continued abstinence since March of 2023. Patient reports not requiring resources for continued abstinence.  Thrombocytopenia (HCC) Mild. In setting of infection. Stable.  Hyponatremia Mild. Possibly in setting of acute illness/pain. Back to wnl.  Pleuritic chest pain Secondary to PE and persistent coughing. -Hycodan PRN, Tessalon perles -Ibuprofen PRN    DVT prophylaxis: Heparin IV Code Status:   Code Status: Full Code Family Communication: None at bedside. Disposition Plan: Discharge SNF pending plan for continued antibiotics/antifungals and specialist final recommendations vs 6 week inpatient hospitalization   Consultants:  Infectious disease PCCM Hematology Cardiothoracic surgery  Procedures:  None  Antimicrobials: Vancomycin Cefepime Micafungin   Subjective: Cough has improved. Hoping to shower. No other concerns.  Objective: BP 117/76 (BP Location: Left Leg)   Pulse 87   Temp 98.3 F (36.8 C) (Oral)   Resp 17   Ht 5\' 6"  (1.676 m)   Wt 82.6 kg   SpO2 100%   BMI 29.41 kg/m   Examination:  General exam: Appears calm and comfortable Respiratory system: Clear to auscultation. Respiratory effort normal. Cardiovascular system: S1 & S2 heard, RRR. 2/6 systolic murmur. Gastrointestinal system: Abdomen is nondistended, soft and nontender. Normal  bowel sounds heard. Central nervous system: Alert and oriented. No focal neurological deficits. Musculoskeletal: No edema. No calf tenderness Skin: No cyanosis. No rashes Psychiatry: Judgement and insight appear  normal. Mood & affect appropriate.    Data Reviewed: I have personally reviewed following labs and imaging studies  CBC Lab Results  Component Value Date   WBC 11.7 (H) 10/14/2021   RBC 3.52 (L) 10/14/2021   HGB 8.3 (L) 10/14/2021   HCT 27.7 (L) 10/14/2021   MCV 78.7 (L) 10/14/2021   MCH 23.6 (L) 10/14/2021   PLT 145 (L) 10/14/2021   MCHC 30.0 10/14/2021   RDW 20.1 (H) 10/14/2021   LYMPHSABS 3.1 10/11/2021   MONOABS 1.8 (H) 10/11/2021   EOSABS 0.2 10/11/2021   BASOSABS 0.1 10/11/2021     Last metabolic panel Lab Results  Component Value Date   NA 135 10/14/2021   K 3.4 (L) 10/14/2021   CL 107 10/14/2021   CO2 22 10/14/2021   BUN 9 10/14/2021   CREATININE 0.62 10/14/2021   GLUCOSE 172 (H) 10/14/2021   GFRNONAA >60 10/14/2021   GFRAA >60 08/07/2019   CALCIUM 7.8 (L) 10/14/2021   PHOS 3.5 09/13/2021   PROT 7.0 10/11/2021   ALBUMIN 2.8 (L) 10/11/2021   BILITOT 1.0 10/11/2021   ALKPHOS 45 10/11/2021   AST 9 (L) 10/11/2021   ALT 9 10/11/2021   ANIONGAP 6 10/14/2021    GFR: Estimated Creatinine Clearance: 107.3 mL/min (by C-G formula based on SCr of 0.62 mg/dL).  Recent Results (from the past 240 hour(s))  Culture, blood (Routine X 2) w Reflex to ID Panel     Status: Abnormal (Preliminary result)   Collection Time: 10/11/21  2:57 AM   Specimen: BLOOD LEFT HAND  Result Value Ref Range Status   Specimen Description BLOOD LEFT HAND  Final   Special Requests   Final    AEROBIC BOTTLE ONLY Blood Culture results may not be optimal due to an excessive volume of blood received in culture bottles   Culture  Setup Time (A)  Final    YEAST AEROBIC BOTTLE ONLY CRITICAL RESULT CALLED TO, READ BACK BY AND VERIFIED WITH: PHARMD CARLA JARDIN ON 10/12/21 @ 1723 BY DRT     Culture (A)  Final    CANDIDA ALBICANS CULTURE REINCUBATED FOR BETTER GROWTH Performed at [facility], [address], [place], [place] [address]    Report Status PENDING  Incomplete  Culture, blood (Routine X 2) w Reflex to ID Panel     Status: None (Preliminary result)   Collection Time: 10/11/21  2:57 AM   Specimen: BLOOD LEFT HAND  Result Value Ref Range Status   Specimen Description BLOOD LEFT HAND  Final   Special Requests   Final    AEROBIC BOTTLE ONLY Blood Culture results may not be optimal due to an excessive volume of blood received in culture bottles   Culture  Setup Time   Final    YEAST WITH PSEUDOHYPHAE AEROBIC BOTTLE ONLY CRITICAL RESULT CALLED TO, READ BACK BY AND VERIFIED WITH: PHARMD CARLA JARDIN ON 10/12/21 @ 1837 BY DRT Performed at [facility], [address], [place], [place] [address]    Culture YEAST  Final   Report Status PENDING  Incomplete  Blood Culture ID Panel (Reflexed)     Status: Abnormal   Collection Time: 10/11/21  2:57 AM  Result Value Ref Range Status   Enterococcus faecalis NOT DETECTED NOT DETECTED Final   Enterococcus Faecium NOT DETECTED NOT DETECTED Final   Listeria monocytogenes NOT DETECTED NOT DETECTED Final   Staphylococcus species NOT DETECTED NOT DETECTED Final   Staphylococcus aureus (BCID) NOT DETECTED  NOT DETECTED Final   Staphylococcus epidermidis NOT DETECTED NOT DETECTED Final   Staphylococcus lugdunensis NOT DETECTED NOT DETECTED Final   Streptococcus species NOT DETECTED NOT DETECTED Final   Streptococcus agalactiae NOT DETECTED NOT DETECTED Final   Streptococcus pneumoniae NOT DETECTED NOT DETECTED Final   Streptococcus pyogenes NOT DETECTED NOT DETECTED Final   A.calcoaceticus-baumannii NOT DETECTED NOT DETECTED Final   Bacteroides fragilis NOT DETECTED NOT DETECTED Final   Enterobacterales NOT DETECTED NOT DETECTED Final   Enterobacter cloacae complex NOT DETECTED NOT DETECTED Final   Escherichia coli  NOT DETECTED NOT DETECTED Final   Klebsiella aerogenes NOT DETECTED NOT DETECTED Final   Klebsiella oxytoca NOT DETECTED NOT DETECTED Final   Klebsiella pneumoniae NOT DETECTED NOT DETECTED Final   Proteus species NOT DETECTED NOT DETECTED Final   Salmonella species NOT DETECTED NOT DETECTED Final   Serratia marcescens NOT DETECTED NOT DETECTED Final   Haemophilus influenzae NOT DETECTED NOT DETECTED Final   Neisseria meningitidis NOT DETECTED NOT DETECTED Final   Pseudomonas aeruginosa NOT DETECTED NOT DETECTED Final   Stenotrophomonas maltophilia NOT DETECTED NOT DETECTED Final   Candida albicans DETECTED (A) NOT DETECTED Final    Comment: CRITICAL RESULT CALLED TO, READ BACK BY AND VERIFIED WITH: PHARMD CARLA JARDIN ON 10/12/21 @ 1723 BY DRT    Candida auris NOT DETECTED NOT DETECTED Final   Candida glabrata NOT DETECTED NOT DETECTED Final   Candida krusei NOT DETECTED NOT DETECTED Final   Candida parapsilosis NOT DETECTED NOT DETECTED Final   Candida tropicalis NOT DETECTED NOT DETECTED Final   Cryptococcus neoformans/gattii NOT DETECTED NOT DETECTED Final    Comment: Performed at [facility], [address] [place], [place] [address]  MRSA Next Gen by PCR, Nasal     Status: None   Collection Time: 10/11/21  9:58 AM   Specimen: Nasal Mucosa; Nasal Swab  Result Value Ref Range Status   MRSA by PCR Next Gen NOT DETECTED NOT DETECTED Final    Comment: (NOTE) The GeneXpert MRSA Assay (FDA approved for NASAL specimens only), is one component of a comprehensive MRSA colonization surveillance program. It is not intended to diagnose MRSA infection nor to guide or monitor treatment for MRSA infections. Test performance is not FDA approved in patients less than 35 years old. Performed at [facility], [address] [place], [place] [address]   Expectorated Sputum Assessment w Gram Stain, Rflx to Resp Cult     Status: None (Preliminary result)   Collection Time: 10/13/21   6:11 AM   Specimen: Expectorated Sputum  Result Value Ref Range Status   Specimen Description EXPECTORATED SPUTUM  Final   Special Requests NONE  Final   Sputum evaluation   Final    THIS SPECIMEN IS ACCEPTABLE FOR SPUTUM CULTURE Performed at [facility], [address] [place], [place] [address]    Report Status PENDING  Incomplete  Culture, Respiratory w Gram Stain     Status: None (Preliminary result)   Collection Time: 10/13/21  6:11 AM  Result Value Ref Range Status   Specimen Description EXPECTORATED SPUTUM  Final   Special Requests NONE Reflexed from [id number]  Final   Gram Stain   Final    FEW SQUAMOUS EPITHELIAL CELLS PRESENT RARE WBC PRESENT, PREDOMINANTLY MONONUCLEAR MODERATE GRAM NEGATIVE RODS FEW GRAM POSITIVE COCCI IN PAIRS Performed at [facility], [address] [place], [place] [address]    Culture PENDING  Incomplete  Report Status PENDING  Incomplete      Radiology Studies: [place] EKG SITE RITE  Result Date: 10/12/2021 If Site Rite image not attached, placement could not be confirmed due to current cardiac rhythm.     LOS: 3 days    Jacquelin Hawking, MD [facility] 10/14/2021, 7:47 AM   If 7PM-7AM, please contact night-coverage [email or web address]

## 2021-10-14 NOTE — Progress Notes (Signed)
ANTICOAGULATION CONSULT NOTE - Follow Up Consult  Pharmacy Consult for Heparin >> Enoxaparin Indication: pulmonary embolus  Allergies  Allergen Reactions   Naltrexone Anxiety    Severe anxiety Restlessness  Vomiting     Patient Measurements: Height: 5\' 6"  (167.6 cm) Weight: 82.6 kg (182 lb 3.2 oz) IBW/kg (Calculated) : 59.3 Heparin Dosing Weight: 76.7 kg  Vital Signs: Temp: 98.3 F (36.8 C) (06/10 0345) Temp Source: Oral (06/10 0345) BP: 117/76 (06/10 0345) Pulse Rate: 87 (06/10 0345)  Labs: Recent Labs    10/12/21 0542 10/12/21 1511 10/13/21 0553 10/13/21 1220 10/14/21 0115  HGB 8.6*  --  7.9*  --  8.3*  HCT 28.1*  --  27.1*  --  27.7*  PLT 130*  --  139*  --  145*  HEPARINUNFRC 0.23*   < > 0.30 0.27* 0.27*  CREATININE 0.72  --  0.70  --  0.62   < > = values in this interval not displayed.    Estimated Creatinine Clearance: 107.3 mL/min (by C-G formula based on SCr of 0.62 mg/dL).   Medications:  Scheduled:   benzonatate  200 mg Oral TID   ferrous gluconate  324 mg Oral Q breakfast   gabapentin  300 mg Oral TID   pantoprazole  40 mg Oral Daily   pregabalin  75 mg Oral TID   Infusions:   ceFEPime (MAXIPIME) IV 2 g (10/13/21 2351)   heparin 3,100 Units/hr (10/14/21 0409)   micafungin (MYCAMINE) 150 mg in sodium chloride 0.9 % 100 mL IVPB 150 mg (10/13/21 1616)   vancomycin 1,500 mg (10/14/21 0237)    Assessment: 36 yo F transferred from OSH with PE. Pt started on IV heparin drip. Pt has hx septic pulmonary emboli and reports adherence to PTA Eliquis. Pharmacy consulted for transition from IV heparin to enoxaparin.   Pt has not been therapeutic on IV heparin. Hematology recommending enoxaparin for PE treatment. Hgb 8.3, plt 145. No signs/symptoms of bleeding noted.  Goal of Therapy:  Heparin level 0.3-0.7 units/ml Monitor platelets by anticoagulation protocol: Yes   Plan:  Stop IV heparin.  Start Enoxaparin 80 mg SQ q12h. Monitor CBC and for  signs/symptoms of bleeding.   Vance Peper, PharmD PGY1 Pharmacy Resident Phone [phone] 10/14/2021 7:17 AM   Please check AMION for all [place] phone numbers After 10:00 PM, call [place] [phone]

## 2021-10-14 NOTE — Progress Notes (Addendum)
Order put in by [place], [facility] to increase Latoya [name]' Heparin gtt to 3100u/hr. When I did this, I got a Guardrails soft stop warning for maximum dosage. I called Barkley Bruns just to confirm she wanted me to run at this rate and she said to bypass the Guardrails stop and run it at 31. She said they are looking into other measures to take to since [name] still is not at a therapeutic level. Pt shows no signs of bleeding or leaking from the IV site.

## 2021-10-14 NOTE — Progress Notes (Signed)
ANTICOAGULATION CONSULT NOTE - Follow Up Consult  Pharmacy Consult for heparin Indication: pulmonary embolus  Labs: Recent Labs    10/12/21 0542 10/12/21 1511 10/13/21 0553 10/13/21 1220 10/14/21 0115  HGB 8.6*  --  7.9*  --  8.3*  HCT 28.1*  --  27.1*  --  27.7*  PLT 130*  --  139*  --  145*  HEPARINUNFRC 0.23*   < > 0.30 0.27* 0.27*  CREATININE 0.72  --  0.70  --  0.62   < > = values in this interval not displayed.    Assessment: 36yo female remains subtherapeutic on heparin with no change in heparin level despite two rate increases, difficult to achieve and maintain therapeutic level in this pt; no infusion issues or signs of bleeding per RN though she does note that there was some oozing from an IV site that has resolved since removing and replacing that line.  Goal of Therapy:  Heparin level 0.3-0.7 units/ml   Plan:  Will increase heparin infusion by 10% to 3100 units/hr and check level in 6 hours.    Wynona Neat, PharmD, BCPS  10/14/2021,3:59 AM

## 2021-10-14 NOTE — Progress Notes (Signed)
ID PROGRESS NOTE  Day 5/vanco/cefepime/micafungin  Now afebrile, on room air  Micro cx: 6/7 c.albicans in blood cx 6/10 blood cx collected   A/P: TV endocarditis (recently treated for MRSA) now with c.albicans - plan to continue on vancomycin and micafungin. Can place picc line on Tuesday if cx remain no growth todate - dr lightfoot to consider pfo closure plus angio vac the following week  Leukocytosis and fevers = improving  Pneumonia = thought to either be related to septic pulm emboli vs. Secondary pneumonia - plan for 7 days of cefepime then stop  High risk medication with toxicity = continue with check cr and vanco levels  Latoya [name] B. Drue Second MD MPH [facility] [phone]

## 2021-10-14 NOTE — Progress Notes (Signed)
NAME:  Latoya [name], MRN:  [id number], DOB:  09-Sep-1985, LOS: 3 ADMISSION DATE:  10/11/2021, CONSULTATION DATE: 10/11/2021 REFERRING MD: [facility], CHIEF COMPLAINT: Recurrent pulmonary embolism, refractory pneumonia  History of Present Illness:  36 year old female with extensive past medical history IV drug abuse MSSA endocarditis now with MRSA endocarditis tricuspid regurgitation history of septic embolism and now presents with breakthrough saddle pulmonary embolism despite being on Eliquis.  He has been transferred to [facility] for infectious disease consult pulmonary critical care was asked to evaluate.  He has had an ongoing pneumonia with likely fevers despite being on antimicrobial therapy.  Questionable need for fiberoptic bronchoscopy cultures to better determine infectious process.   Pertinent  Medical History   Past Medical History:  Diagnosis Date   Heroin addiction (HCC)    IV drug abuse (HCC)     Significant Hospital Events: Including procedures, antibiotic start and stop dates in addition to other pertinent events   6/6-transferred to [facility] for management of pulmonary embolism 6/8 blood cultures with Candida, micafungin added 6/9 still fevering 101.5  Interim History / Subjective:  Afebrile x 24hrs SOB improving, no change in sputum production Heparin levels remain subtherapeutic despite rate changes> changed to lovenox today  Objective   Blood pressure 138/74, pulse 87, temperature 97.7 F (36.5 C), temperature source Oral, resp. rate 14, height 5\' 6"  (1.676 m), weight 82.6 kg, SpO2 100 %.        Intake/Output Summary (Last 24 hours) at 10/14/2021 1547 Last data filed at 10/14/2021 1000 Gross per 24 hour  Intake 2579.77 ml  Output --  Net 2579.77 ml   Filed Weights   10/11/21 0023  Weight: 82.6 kg    Examination: General:  Adult female sitting upright in bed in NAD HEENT: MM pink/moist Neuro: Aox3, MAE CV: rr PULM:  non labored, CTA GI: soft, bs+,  ND/ NT Extremities: warm/dry, no LE edema      Resolved Hospital Problem list     Assessment & Plan:   Pulmonary embolism despite compliance with Eliquis history of septic emboli - remains on room air and hemodynamically stable.  Remains high risk for further emboli given tricuspid IE.   - hematology consulted 6/9 for anticoagulation recommendations - remains subtherapeutic on heparin gtt despite rate increases, transitioned to lovenox today   Tricuspid valve endocarditis - TCTS following.  Considering possible occlusion of her PFO f/b angio VAC debridement    MRSA Endocarditis/ bacteremia  Candidemia  LLL pneumonia  - ID following - Currently on vancomycin and cefepime.  Micafungin added 6/8 with resolution of fevers for the last 24hrs  - repeating blood cultures 6/10 - will need central access eventually for prolonged abx when [facility] clears  - 6/9 expectorated sputum pending    History of lumbar discitis - abx as above    History of IV drug use -Has been clean since March   Best Practice (right click and "Reselect all SmartList Selections" daily)   Diet/type: Regular consistency (see orders) DVT prophylaxis: LMWH GI prophylaxis: PPI Lines: N/A Foley:  N/A Code Status:  full code Last date of multidisciplinary goals of care discussion [per primary]   Nothing further to add.  PCCM will sign off.  Please call April back if we can be of any further assistance.   Labs   CBC: Recent Labs  Lab 10/11/21 0257 10/12/21 0542 10/13/21 0553 10/14/21 0115  WBC 16.1* 19.4* 17.4* 11.7*  NEUTROABS 10.8*  --   --   --  HGB 8.6* 8.6* 7.9* 8.3*  HCT 28.7* 28.1* 27.1* 27.7*  MCV 78.6* 77.6* 79.9* 78.7*  PLT 120* 130* 139* 145*    Basic Metabolic Panel: Recent Labs  Lab 10/11/21 0257 10/12/21 0542 10/13/21 0553 10/14/21 0115  NA 134* 131* 129* 135  K 3.7 3.4* 3.5 3.4*  CL 102 101 99 107  CO2 23 21* 19* 22  GLUCOSE 89 150* 127* 172*  BUN 7 7 8 9   CREATININE 0.70  0.72 0.70 0.62  CALCIUM 8.4* 7.9* 8.0* 7.8*  MG 1.9  --   --   --    GFR: Estimated Creatinine Clearance: 107.3 mL/min (by C-G formula based on SCr of 0.62 mg/dL). Recent Labs  Lab 10/11/21 0257 10/12/21 0542 10/13/21 0553 10/14/21 0115  WBC 16.1* 19.4* 17.4* 11.7*  LATICACIDVEN 1.8  --   --   --     Liver Function Tests: Recent Labs  Lab 10/11/21 0257  AST 9*  ALT 9  ALKPHOS 45  BILITOT 1.0  PROT 7.0  ALBUMIN 2.8*   No results for input(s): "LIPASE", "AMYLASE" in the last 168 hours. No results for input(s): "AMMONIA" in the last 168 hours.  ABG    Component Value Date/Time   PHART 7.48 (H) 08/04/2021 1610   PCO2ART 30 (L) 08/04/2021 1610   PO2ART 69 (L) 08/04/2021 1610   HCO3 22.6 08/04/2021 1610   TCO2 25 08/11/2021 1251   O2SAT 96.9 08/04/2021 1610     Coagulation Profile: Recent Labs  Lab 10/11/21 0257  INR 1.3*    Cardiac Enzymes: No results for input(s): "CKTOTAL", "CKMB", "CKMBINDEX", "TROPONINI" in the last 168 hours.  HbA1C: Hgb A1c MFr Bld  Date/Time Value Ref Range Status  08/22/2021 09:45 AM 5.1 4.8 - 5.6 % Final    Comment:    (NOTE) Pre diabetes:          5.7%-6.4%  Diabetes:              >6.4%  Glycemic control for   <7.0% adults with diabetes     CBG: No results for input(s): "GLUCAP" in the last 168 hours.     08/24/2021, ACNP [facility] 10/14/2021, 4:08 PM  See Amion for pager If no response to pager, please call PCCM consult pager After 7:00 pm call Elink

## 2021-10-15 DIAGNOSIS — R7881 Bacteremia: Secondary | ICD-10-CM | POA: Diagnosis not present

## 2021-10-15 DIAGNOSIS — R0781 Pleurodynia: Secondary | ICD-10-CM | POA: Diagnosis not present

## 2021-10-15 DIAGNOSIS — B9562 Methicillin resistant Staphylococcus aureus infection as the cause of diseases classified elsewhere: Secondary | ICD-10-CM | POA: Diagnosis not present

## 2021-10-15 DIAGNOSIS — F1910 Other psychoactive substance abuse, uncomplicated: Secondary | ICD-10-CM | POA: Diagnosis not present

## 2021-10-15 DIAGNOSIS — B377 Candidal sepsis: Secondary | ICD-10-CM | POA: Diagnosis not present

## 2021-10-15 DIAGNOSIS — D696 Thrombocytopenia, unspecified: Secondary | ICD-10-CM | POA: Diagnosis not present

## 2021-10-15 DIAGNOSIS — I2602 Saddle embolus of pulmonary artery with acute cor pulmonale: Secondary | ICD-10-CM | POA: Diagnosis not present

## 2021-10-15 DIAGNOSIS — M4646 Discitis, unspecified, lumbar region: Secondary | ICD-10-CM | POA: Diagnosis not present

## 2021-10-15 DIAGNOSIS — I079 Rheumatic tricuspid valve disease, unspecified: Secondary | ICD-10-CM | POA: Diagnosis not present

## 2021-10-15 DIAGNOSIS — J189 Pneumonia, unspecified organism: Secondary | ICD-10-CM | POA: Diagnosis not present

## 2021-10-15 DIAGNOSIS — I2692 Saddle embolus of pulmonary artery without acute cor pulmonale: Secondary | ICD-10-CM | POA: Diagnosis not present

## 2021-10-15 LAB — BASIC METABOLIC PANEL
Anion gap: 9 (ref 5–15)
BUN: 8 mg/dL (ref 6–20)
CO2: 20 mmol/L — ABNORMAL LOW (ref 22–32)
Calcium: 7.8 mg/dL — ABNORMAL LOW (ref 8.9–10.3)
Chloride: 102 mmol/L (ref 98–111)
Creatinine, Ser: 0.68 mg/dL (ref 0.44–1.00)
GFR, Estimated: >60 mL/min (ref >60)
Glucose, Bld: 97 mg/dL (ref 70–99)
Potassium: 4.6 mmol/L (ref 3.5–5.1)
Sodium: 131 mmol/L — ABNORMAL LOW (ref 135–145)

## 2021-10-15 LAB — CBC
HCT: 26.8 % — ABNORMAL LOW (ref 36.0–46.0)
Hemoglobin: 8.1 g/dL — ABNORMAL LOW (ref 12.0–15.0)
MCH: 23.4 pg — ABNORMAL LOW (ref 26.0–34.0)
MCHC: 30.2 g/dL (ref 30.0–36.0)
MCV: 77.5 fL — ABNORMAL LOW (ref 80.0–100.0)
Platelets: 175 10*3/uL (ref 150–400)
RBC: 3.46 MIL/uL — ABNORMAL LOW (ref 3.87–5.11)
RDW: 19.9 % — ABNORMAL HIGH (ref 11.5–15.5)
WBC: 13.6 10*3/uL — ABNORMAL HIGH (ref 4.0–10.5)
nRBC: 0.1 % (ref 0.0–0.2)

## 2021-10-15 NOTE — Progress Notes (Signed)
PROGRESS NOTE    Latoya DecampHeather Chapple  ZOX:[id number] DOB: 1986/04/18 DOA: 10/11/2021 PCP: Pcp, No   Brief Narrative: Latoya [name] is a 36 y.o. female with a history of MSSA tricuspid valve endocarditis, MRSA L4-L5 facet  join septic discitis in 07/2019 and more recently diagnosis of MRSA bacteremia with infective endocarditis and tricuspid valve vegetation diagnosed 07/2021. Patient presented from [facility] for saddle pulmonary embolism. Blood cultures growing candida albicans. Patient started on micafungin. PICC placement deferred and is pending negative repeat blood cultures   Assessment and Plan: * Acute saddle pulmonary embolism (HCC) Noted on CTA chest. Acute on chronic diagnosis while on Eliquis PO. Started on Heparin IV. RV heart strain noted on imaging. Transthoracic Echocardiogram obtained which showed stable RV dysfunction from prior. Complicated by TV endocarditis with history of septic emboli. PCCM consulted with recommendations for continued anticoagulation and no thrombectomy. On room air. Hematology recommending Lovenox for treatment. Heparin not therapeutic. No procedures anticipated in the near future. Transitioned to Lovenox on 6/10. -Continue Lovenox  Candidemia (HCC) New diagnosis with blood culture (6/7) resulting yeast.Patient started on micafungin IV. Leukocytosis improving. -Continue micafungin IV -Follow-up repeat blood cultures (6/10)  Lumbar discitis Noted on imaging. ID consulted. Neurosurgery curbsided. Recommendations for IR aspiration/biopsy. IR unable to perform secondary to no target for aspiration/biopsy. In setting of persistent fevers concerning for persistent bacteremia. -See problem, Endocarditis of tricuspid valve  Endocarditis of tricuspid valve Previously diagnosed. Initial plan for angio vac which was canceled secondary to PFO. Patient was treated for this as an inpatient and discharged on linezolid. Vancomycin and Cefepime restarted on  admission. ID consulted. 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 -ID recommendations: continue Vancomycin/Cefepime, follow-up blood cultures -TCTS consulted on 6/8 for recommendations on persistent large TV vegetation. Plan to consider occlusion of her PFO for consideration of angio VAC debridement  Pneumonia of left lower lobe due to infectious organism Patient with a history of septic emboli on prior admission. Patient noted to have evidence of LLL pneumonia. Started on antibiotics as mentioned in problem, Endocarditis of tricuspid valve.  MRSA bacteremia -See problem, Endocarditis of tricuspid valve  IV drug abuse (HCC) History of. Patient reports continued abstinence since March of 2023. Patient reports not requiring resources for continued abstinence.  Thrombocytopenia (HCC) Mild. In setting of infection. Stable.  Hyponatremia Mild. Possibly in setting of acute illness/pain. Back to wnl.  Pleuritic chest pain Secondary to PE and persistent coughing. -Hycodan PRN, Tessalon perles -Ibuprofen PRN    DVT prophylaxis: Heparin IV Code Status:   Code Status: Full Code Family Communication: None at bedside. Disposition Plan: Discharge SNF pending plan for continued antibiotics/antifungals and specialist final recommendations vs 6 week inpatient hospitalization   Consultants:  Infectious disease PCCM Hematology Cardiothoracic surgery  Procedures:  None  Antimicrobials: Vancomycin Cefepime Micafungin   Subjective: Some dyspnea mostly at night when going to bed. Also with diaphoresis overnight. Continues to have fevers but fever trend is downward.  Objective: BP (!) 148/80 (BP Location: Left Leg)   Pulse (!) 109   Temp 100.2 F (37.9 C) (Oral)   Resp 20   Ht 5\' 6"  (1.676 m)   Wt 82.6 kg   SpO2 100%   BMI 29.41 kg/m   Examination:  General exam: Appears calm and comfortable Respiratory system: Clear to  auscultation. Respiratory effort normal. Cardiovascular system: S1 & S2 heard, RRR. 2/6 systolic murmur. Gastrointestinal system: Abdomen is nondistended, soft and nontender. Normal bowel sounds heard. Central  nervous system: Alert and oriented. No focal neurological deficits. Musculoskeletal: No edema. No calf tenderness Skin: No cyanosis. No rashes Psychiatry: Judgement and insight appear normal. Mood & affect appropriate.     Data Reviewed: I have personally reviewed following labs and imaging studies  CBC Lab Results  Component Value Date   WBC 13.6 (H) 10/15/2021   RBC 3.46 (L) 10/15/2021   HGB 8.1 (L) 10/15/2021   HCT 26.8 (L) 10/15/2021   MCV 77.5 (L) 10/15/2021   MCH 23.4 (L) 10/15/2021   PLT 175 10/15/2021   MCHC 30.2 10/15/2021   RDW 19.9 (H) 10/15/2021   LYMPHSABS 3.1 10/11/2021   MONOABS 1.8 (H) 10/11/2021   EOSABS 0.2 10/11/2021   BASOSABS 0.1 10/11/2021     Last metabolic panel Lab Results  Component Value Date   NA 131 (L) 10/15/2021   K 4.6 10/15/2021   CL 102 10/15/2021   CO2 20 (L) 10/15/2021   BUN 8 10/15/2021   CREATININE 0.68 10/15/2021   GLUCOSE 97 10/15/2021   GFRNONAA >60 10/15/2021   GFRAA >60 08/07/2019   CALCIUM 7.8 (L) 10/15/2021   PHOS 3.5 09/13/2021   PROT 7.0 10/11/2021   ALBUMIN 2.8 (L) 10/11/2021   BILITOT 1.0 10/11/2021   ALKPHOS 45 10/11/2021   AST 9 (L) 10/11/2021   ALT 9 10/11/2021   ANIONGAP 9 10/15/2021    GFR: Estimated Creatinine Clearance: 107.3 mL/min (by C-G formula based on SCr of 0.68 mg/dL).  Recent Results (from the past 240 hour(s))  Culture, blood (Routine X 2) w Reflex to ID Panel     Status: Abnormal (Preliminary result)   Collection Time: 10/11/21  2:57 AM   Specimen: BLOOD LEFT HAND  Result Value Ref Range Status   Specimen Description BLOOD LEFT HAND  Final   Special Requests   Final    AEROBIC BOTTLE ONLY Blood Culture results may not be optimal due to an excessive volume of blood received in  culture bottles   Culture  Setup Time (A)  Final    YEAST AEROBIC BOTTLE ONLY CRITICAL RESULT CALLED TO, READ BACK BY AND VERIFIED WITH: PHARMD CARLA JARDIN ON 10/12/21 @ 1723 BY DRT    Culture (A)  Final    CANDIDA ALBICANS Sent to [facility] for further susceptibility testing. Performed at [facility], [address], [place], [place] [address]    Report Status PENDING  Incomplete  Culture, blood (Routine X 2) w Reflex to ID Panel     Status: Abnormal (Preliminary result)   Collection Time: 10/11/21  2:57 AM   Specimen: BLOOD LEFT HAND  Result Value Ref Range Status   Specimen Description BLOOD LEFT HAND  Final   Special Requests   Final    AEROBIC BOTTLE ONLY Blood Culture results may not be optimal due to an excessive volume of blood received in culture bottles   Culture  Setup Time   Final    YEAST WITH PSEUDOHYPHAE AEROBIC BOTTLE ONLY CRITICAL RESULT CALLED TO, READ BACK BY AND VERIFIED WITH: PHARMD CARLA JARDIN ON 10/12/21 @ 1837 BY DRT Performed at [facility], [address], [place], [place] [address]    Culture CANDIDA ALBICANS (A)  Final   Report Status PENDING  Incomplete  Blood Culture ID Panel (Reflexed)     Status: Abnormal   Collection Time: 10/11/21  2:57 AM  Result Value Ref Range Status   Enterococcus faecalis NOT DETECTED NOT DETECTED Final   Enterococcus Faecium NOT  DETECTED NOT DETECTED Final   Listeria monocytogenes NOT DETECTED NOT DETECTED Final   Staphylococcus species NOT DETECTED NOT DETECTED Final   Staphylococcus aureus (BCID) NOT DETECTED NOT DETECTED Final   Staphylococcus epidermidis NOT DETECTED NOT DETECTED Final   Staphylococcus lugdunensis NOT DETECTED NOT DETECTED Final   Streptococcus species NOT DETECTED NOT DETECTED Final   Streptococcus agalactiae NOT DETECTED NOT DETECTED Final   Streptococcus pneumoniae NOT DETECTED NOT DETECTED Final   Streptococcus pyogenes NOT DETECTED NOT DETECTED Final   A.calcoaceticus-baumannii NOT  DETECTED NOT DETECTED Final   Bacteroides fragilis NOT DETECTED NOT DETECTED Final   Enterobacterales NOT DETECTED NOT DETECTED Final   Enterobacter cloacae complex NOT DETECTED NOT DETECTED Final   Escherichia coli NOT DETECTED NOT DETECTED Final   Klebsiella aerogenes NOT DETECTED NOT DETECTED Final   Klebsiella oxytoca NOT DETECTED NOT DETECTED Final   Klebsiella pneumoniae NOT DETECTED NOT DETECTED Final   Proteus species NOT DETECTED NOT DETECTED Final   Salmonella species NOT DETECTED NOT DETECTED Final   Serratia marcescens NOT DETECTED NOT DETECTED Final   Haemophilus influenzae NOT DETECTED NOT DETECTED Final   Neisseria meningitidis NOT DETECTED NOT DETECTED Final   Pseudomonas aeruginosa NOT DETECTED NOT DETECTED Final   Stenotrophomonas maltophilia NOT DETECTED NOT DETECTED Final   Candida albicans DETECTED (A) NOT DETECTED Final    Comment: CRITICAL RESULT CALLED TO, READ BACK BY AND VERIFIED WITH: PHARMD CARLA JARDIN ON 10/12/21 @ 1723 BY DRT    Candida auris NOT DETECTED NOT DETECTED Final   Candida glabrata NOT DETECTED NOT DETECTED Final   Candida krusei NOT DETECTED NOT DETECTED Final   Candida parapsilosis NOT DETECTED NOT DETECTED Final   Candida tropicalis NOT DETECTED NOT DETECTED Final   Cryptococcus neoformans/gattii NOT DETECTED NOT DETECTED Final    Comment: Performed at [facility], [address] [place], [place] [address]  MRSA Next Gen by PCR, Nasal     Status: None   Collection Time: 10/11/21  9:58 AM   Specimen: Nasal Mucosa; Nasal Swab  Result Value Ref Range Status   MRSA by PCR Next Gen NOT DETECTED NOT DETECTED Final    Comment: (NOTE) The GeneXpert MRSA Assay (FDA approved for NASAL specimens only), is one component of a comprehensive MRSA colonization surveillance program. It is not intended to diagnose MRSA infection nor to guide or monitor treatment for MRSA infections. Test performance is not FDA approved in patients less than 83  years old. Performed at [facility], [address] [place], [place] [address]   Expectorated Sputum Assessment w Gram Stain, Rflx to Resp Cult     Status: None (Preliminary result)   Collection Time: 10/13/21  6:11 AM   Specimen: Expectorated Sputum  Result Value Ref Range Status   Specimen Description EXPECTORATED SPUTUM  Final   Special Requests NONE  Final   Sputum evaluation   Final    THIS SPECIMEN IS ACCEPTABLE FOR SPUTUM CULTURE Performed at [facility], [address], [place], [place] [address]    Report Status PENDING  Incomplete  Culture, Respiratory w Gram Stain     Status: None (Preliminary result)   Collection Time: 10/13/21  6:11 AM  Result Value Ref Range Status   Specimen Description EXPECTORATED SPUTUM  Final   Special Requests NONE Reflexed from [id number]  Final   Gram Stain   Final    FEW SQUAMOUS EPITHELIAL CELLS PRESENT RARE WBC PRESENT, PREDOMINANTLY MONONUCLEAR MODERATE GRAM  NEGATIVE RODS FEW GRAM POSITIVE COCCI IN PAIRS    Culture   Final    CULTURE REINCUBATED FOR BETTER GROWTH Performed at [facility], [address], [place], [place] [address]    Report Status PENDING  Incomplete  Culture, blood (Routine X 2) w Reflex to ID Panel     Status: None (Preliminary result)   Collection Time: 10/14/21  1:44 PM   Specimen: BLOOD LEFT HAND  Result Value Ref Range Status   Specimen Description BLOOD LEFT HAND  Final   Special Requests   Final    BOTTLES DRAWN AEROBIC ONLY Blood Culture results may not be optimal due to an inadequate volume of blood received in culture bottles Performed at [facility], [address] [place], [place] [address]    Culture PENDING  Incomplete   Report Status PENDING  Incomplete      Radiology Studies: No results found.    LOS: 4 days    Jacquelin Hawking, MD [facility] 10/15/2021, 1:12 PM   If 7PM-7AM, please contact night-coverage [email or web address]

## 2021-10-16 ENCOUNTER — Other Ambulatory Visit (HOSPITAL_COMMUNITY): Payer: Self-pay

## 2021-10-16 DIAGNOSIS — B9562 Methicillin resistant Staphylococcus aureus infection as the cause of diseases classified elsewhere: Secondary | ICD-10-CM | POA: Diagnosis not present

## 2021-10-16 DIAGNOSIS — R7881 Bacteremia: Secondary | ICD-10-CM | POA: Diagnosis not present

## 2021-10-16 DIAGNOSIS — I2692 Saddle embolus of pulmonary artery without acute cor pulmonale: Secondary | ICD-10-CM | POA: Diagnosis not present

## 2021-10-16 DIAGNOSIS — J189 Pneumonia, unspecified organism: Secondary | ICD-10-CM | POA: Diagnosis not present

## 2021-10-16 DIAGNOSIS — F1910 Other psychoactive substance abuse, uncomplicated: Secondary | ICD-10-CM | POA: Diagnosis not present

## 2021-10-16 DIAGNOSIS — I2602 Saddle embolus of pulmonary artery with acute cor pulmonale: Secondary | ICD-10-CM | POA: Diagnosis not present

## 2021-10-16 DIAGNOSIS — B377 Candidal sepsis: Secondary | ICD-10-CM | POA: Diagnosis not present

## 2021-10-16 DIAGNOSIS — M4646 Discitis, unspecified, lumbar region: Secondary | ICD-10-CM | POA: Diagnosis not present

## 2021-10-16 DIAGNOSIS — I079 Rheumatic tricuspid valve disease, unspecified: Secondary | ICD-10-CM | POA: Diagnosis not present

## 2021-10-16 LAB — CULTURE, RESPIRATORY W GRAM STAIN: Culture: NORMAL

## 2021-10-16 LAB — BASIC METABOLIC PANEL
Anion gap: 13 (ref 5–15)
BUN: 8 mg/dL (ref 6–20)
CO2: 15 mmol/L — ABNORMAL LOW (ref 22–32)
Calcium: 7.9 mg/dL — ABNORMAL LOW (ref 8.9–10.3)
Chloride: 106 mmol/L (ref 98–111)
Creatinine, Ser: 0.56 mg/dL (ref 0.44–1.00)
GFR, Estimated: >60 mL/min (ref >60)
Glucose, Bld: 99 mg/dL (ref 70–99)
Potassium: 5.1 mmol/L (ref 3.5–5.1)
Sodium: 134 mmol/L — ABNORMAL LOW (ref 135–145)

## 2021-10-16 LAB — CBC
HCT: 28.8 % — ABNORMAL LOW (ref 36.0–46.0)
Hemoglobin: 8.6 g/dL — ABNORMAL LOW (ref 12.0–15.0)
MCH: 23.2 pg — ABNORMAL LOW (ref 26.0–34.0)
MCHC: 29.9 g/dL — ABNORMAL LOW (ref 30.0–36.0)
MCV: 77.8 fL — ABNORMAL LOW (ref 80.0–100.0)
Platelets: 251 10*3/uL (ref 150–400)
RBC: 3.7 MIL/uL — ABNORMAL LOW (ref 3.87–5.11)
RDW: 20.4 % — ABNORMAL HIGH (ref 11.5–15.5)
WBC: 14.9 10*3/uL — ABNORMAL HIGH (ref 4.0–10.5)
nRBC: 0.6 % — ABNORMAL HIGH (ref 0.0–0.2)

## 2021-10-16 MED ORDER — HYDROCOD POLI-CHLORPHE POLI ER 10-8 MG/5ML PO SUER
5.0000 mL | Freq: Every evening | ORAL | Status: DC | PRN
  Administered 2021-10-17 – 2021-10-18 (×3): 5 mL via ORAL
  Filled 2021-10-16 (×3): qty 5

## 2021-10-16 MED ORDER — DOCUSATE SODIUM 100 MG PO CAPS
100.0000 mg | ORAL_CAPSULE | Freq: Two times a day (BID) | ORAL | Status: DC | PRN
Start: 2021-10-16 — End: 2021-11-26
  Administered 2021-10-16 – 2021-11-05 (×5): 100 mg via ORAL
  Filled 2021-10-16 (×5): qty 1

## 2021-10-16 MED ORDER — SODIUM CHLORIDE 0.9 % IV SOLN: INTRAVENOUS | Status: DC | PRN

## 2021-10-16 NOTE — TOC Benefit Eligibility Note (Signed)
Patient Scientific laboratory technician completed.     The patient is currently admitted and upon discharge could be taking lovenox 80 mg.   The current 30 day co-pay is, $0.   The patient is insured through [employer].

## 2021-10-16 NOTE — Progress Notes (Addendum)
HEMATOLOGY-ONCOLOGY PROGRESS NOTE  ASSESSMENT AND PLAN: 1.  Acute saddle pulmonary embolism -CT chest 10/09/2021 performed at [facility] prior emboli in Latoya left lower lobe pulmonary arteries, new large saddle pulmonary embolus at Latoya bifurcation of Latoya right pulmonary artery consistent with recurrent PE with evidence of right heart strain. 2.  Bilateral pulmonary emboli diagnosed 09/12/2021-started on Eliquis 3.  Endocarditis of Latoya tricuspid valve 4.  Candidemia 5.  MRSA bacteremia 6.  Pneumonia of Latoya left lower lobe 7.  IV drug use-last use 08/02/2021 8.  Microcytic anemia 9.  Leukocytosis 10.  Mild thrombocytopenia  11.  Lumbar discitis  Latoya Latoya [name] appears unchanged.  Latoya Latoya [name] has been transitioned to Lovenox 1 mg/kg twice daily.  Latoya Latoya [name] is tolerating Latoya Lovenox well.  Recommend for her to continue Lovenox.  Cardiothoracic surgery is also following and considering angio VAC debridement.  They would not consider valve replacement until Latoya Latoya [name] has been off drugs for at least 6 months.  ID is following and managing her candidemia and endocarditis.  Recommendations: 1.  Continue Lovenox 1 mg/kg. 2.  Management of PFO, ongoing anticoagulation and endocarditis per cardiothoracic surgery and ID.  Hematology will sign off at this time.  Please call if any questions arise.  Clenton Pare Latoya Latoya [name] was interviewed and examined.  Latoya Latoya [name].  Latoya Latoya [name] is now maintained on Lovenox anticoagulation.  Latoya Latoya [name] is being followed by infectious disease, CVTS, and pulmonary medicine for management of endocarditis and pulmonary emboli.  Latoya Latoya [name] has been transitioned to Lovenox anticoagulation.  Hematology will sign off of Latoya case.  We are available to see her as needed.  I was present for greater than 50% of today's visit.  I performed medical decision making.   SUBJECTIVE: Latoya Latoya [name] was transitioned to Lovenox over Latoya weekend.  No bleeding reported.  Continues to have intermittent  low-grade fevers.  PHYSICAL EXAMINATION:  Vitals:   10/16/21 0745 10/16/21 1154  BP: 130/89 107/79  Pulse: (!) 109   Resp: 16 14  Temp: 99.5 F (37.5 C) 98.2 F (36.8 C)  SpO2: 100% 96%   Filed Weights   10/11/21 0023  Weight: 82.6 kg    Intake/Output from previous day: 06/11 0701 - 06/12 0700 In: 1000 [P.O.:600; IV Piggyback:400] Out: -   Physical Exam Vitals reviewed.  Constitutional:      General: Latoya Latoya [name] is not in acute distress. HENT:     Head: Normocephalic.     Mouth/Throat:     Pharynx: No oropharyngeal exudate or posterior oropharyngeal erythema.  Eyes:     General: No scleral icterus.    Conjunctiva/sclera: Conjunctivae normal.  Cardiovascular:     Rate and Rhythm: Normal rate.     Heart sounds: Murmur heard.  Pulmonary:     Effort: No respiratory distress.     Breath sounds: Normal breath sounds.  Abdominal:     Palpations: Abdomen is soft.     Tenderness: There is no abdominal tenderness.  Neurological:     Mental Status: Latoya Latoya [name] is alert and oriented to person, place, and time.   Lungs-scattered inspiratory rhonchi at Latoya posterior chest bilaterally  LABORATORY DATA:  I have reviewed Latoya data as listed    Latest Ref Rng & Units 10/16/2021    6:50 AM 10/15/2021   12:20 AM 10/14/2021    1:15 AM  CMP  Glucose 70 - 99 mg/dL 99  97  301   BUN 6 - 20 mg/dL Creatinine 0.44 -  1.00 mg/dL 8.58  8.50  2.77   Sodium 135 - 145 mmol/L 134  131  135   Potassium 3.5 - 5.1 mmol/L 5.1  4.6  3.4   Chloride 98 - 111 mmol/L 106  102  107   CO2 22 - 32 mmol/L 15  20  22    Calcium 8.9 - 10.3 mg/dL 7.9  7.8  7.8     Lab Results  Component Value Date   WBC 14.9 (H) 10/16/2021   HGB 8.6 (L) 10/16/2021   HCT 28.8 (L) 10/16/2021   MCV 77.8 (L) 10/16/2021   PLT 251 10/16/2021   NEUTROABS 10.8 (H) 10/11/2021    No results found for: "CEA1", "CEA", "12/11/2021", "CA125", "PSA1"  [id number] EKG SITE RITE  Result Date: 10/12/2021 If Site Rite image not attached,  placement could not be confirmed due to current cardiac rhythm.  ECHOCARDIOGRAM COMPLETE  Result Date: 10/09/2021    ECHOCARDIOGRAM REPORT   Latoya [name]   Latoya Latoya [name] Date of Exam: 10/09/2021 Medical Rec #:  12/09/2021      Height:       66.0 in Accession #:    [id number]     Weight:       185.2 lb Date of Birth:  Oct 26, 1985     BSA:          1.936 m Latoya [name] Age:    35 years       BP:           105/74 mmHg Latoya [name] Gender: F              HR:           100 bpm. Exam Location:  Outpatient Procedure: 2D Echo, Cardiac Doppler and Color Doppler Indications:    Endocarditis I38  History:        Latoya [name] has prior history of Echocardiogram examinations, most                 recent 09/13/2021.  Sonographer:    11/13/2021 RDCS Referring Phys: Eulah Pont TRUNG T VU IMPRESSIONS  1. Left ventricular ejection fraction, by estimation, is 60 to 65%. Latoya left ventricle has normal function. Latoya left ventricle has no regional wall motion abnormalities. Left ventricular diastolic parameters were normal.  2. Right ventricular systolic function is normal. Latoya right ventricular size is moderately enlarged. There is mildly elevated pulmonary artery systolic pressure.  3. Right atrial size was moderately dilated.  4. A small pericardial effusion is present.  5. Latoya mitral valve is grossly normal. No evidence of mitral valve regurgitation. No evidence of mitral stenosis.  6. Mobile echodensity again visualized, 1.9 cm by 0.8 cm. Latoya tricuspid valve is abnormal. Tricuspid valve regurgitation is severe.  7. Latoya aortic valve is tricuspid. Aortic valve regurgitation is not visualized. No aortic stenosis is present.  8. Latoya inferior vena cava is dilated in size with >50% respiratory variability, suggesting right atrial pressure of 8 mmHg. Comparison(s): Prior images reviewed side by side. Conclusion(s)/Recommendation(s): Tricuspid valve endocarditis persists, with severe TR and moderate RV dilation. FINDINGS  Left Ventricle: Left ventricular  ejection fraction, by estimation, is 60 to 65%. Latoya left ventricle has normal function. Latoya left ventricle has no regional wall motion abnormalities. Latoya left ventricular internal cavity size was normal in size. There is  no left ventricular hypertrophy. Left ventricular diastolic parameters were normal. Right Ventricle: Latoya right ventricular size is moderately enlarged. Right vetricular wall thickness was not well visualized. Right ventricular systolic function  is normal. There is mildly elevated pulmonary artery systolic pressure. Latoya tricuspid regurgitant velocity is 2.99 m/s, and with an assumed right atrial pressure of 8 mmHg, Latoya estimated right ventricular systolic pressure is 43.8 mmHg. Left Atrium: Left atrial size was normal in size. Right Atrium: Right atrial size was moderately dilated. Pericardium: A small pericardial effusion is present. Mitral Valve: Latoya mitral valve is grossly normal. No evidence of mitral valve regurgitation. No evidence of mitral valve stenosis. Tricuspid Valve: Mobile echodensity again visualized, 1.9 cm by 0.8 cm. Latoya tricuspid valve is abnormal. Tricuspid valve regurgitation is severe. Aortic Valve: Latoya aortic valve is tricuspid. Aortic valve regurgitation is not visualized. No aortic stenosis is present. Pulmonic Valve: Latoya pulmonic valve was not well visualized. Pulmonic valve regurgitation is trivial. No evidence of pulmonic stenosis. Aorta: Latoya aortic root, ascending aorta, aortic arch and descending aorta are all structurally normal, with no evidence of dilitation or obstruction. Venous: Latoya inferior vena cava is dilated in size with greater than 50% respiratory variability, suggesting right atrial pressure of 8 mmHg. IAS/Shunts: Latoya interatrial septum was not well visualized.  LEFT VENTRICLE PLAX 2D LVIDd:         4.40 cm     Diastology LVIDs:         3.30 cm     LV e' medial:    15.00 cm/s LV PW:         1.00 cm     LV E/e' medial:  4.1 LV IVS:        0.90 cm     LV e'  lateral:   17.00 cm/s LVOT diam:     2.30 cm     LV E/e' lateral: 3.6 LV SV:         52 LV SV Index:   27 LVOT Area:     4.15 cm  LV Volumes (MOD) LV vol d, MOD A2C: 94.8 ml LV vol d, MOD A4C: 47.3 ml LV vol s, MOD A2C: 55.1 ml LV vol s, MOD A4C: 27.0 ml LV SV MOD A2C:     39.7 ml LV SV MOD A4C:     47.3 ml LV SV MOD BP:      31.8 ml RIGHT VENTRICLE RV Basal diam:  4.85 cm RV S prime:     24.40 cm/s TAPSE (M-mode): 2.4 cm LEFT ATRIUM             Index        RIGHT ATRIUM           Index LA diam:        3.50 cm 1.81 cm/m   RA Area:     23.60 cm LA Vol (A2C):   41.2 ml 21.29 ml/m  RA Volume:   81.90 ml  42.31 ml/m LA Vol (A4C):   35.8 ml 18.50 ml/m LA Biplane Vol: 41.6 ml 21.49 ml/m  AORTIC VALVE LVOT Vmax:   80.90 cm/s LVOT Vmean:  54.400 cm/s LVOT VTI:    0.126 m  AORTA Ao Root diam: 3.30 cm Ao Asc diam:  3.20 cm MITRAL VALVE               TRICUSPID VALVE MV Area (PHT): 5.42 cm    TR Peak grad:   35.8 mmHg MV Decel Time: 140 msec    TR Vmax:        299.00 cm/s MV E velocity: 61.30 cm/s MV A velocity: 50.70 cm/s  SHUNTS MV E/A ratio:  1.21  Systemic VTI:  0.13 m                            Systemic Diam: 2.30 cm Latoya Red MD Electronically signed by Latoya Red MD Signature Date/Time: 10/09/2021/3:24:55 PM    Final    MR LUMBAR SPINE WO CONTRAST  Result Date: 10/08/2021 CLINICAL DATA:  Initial evaluation for low back pain, infection suspected. Recent MRSA bacteremia. EXAM: MRI LUMBAR SPINE WITHOUT CONTRAST TECHNIQUE: Multiplanar, multisequence MR imaging of Latoya lumbar spine was performed. No intravenous contrast was administered. COMPARISON:  Prior MRI from 08/02/2021. FINDINGS: Segmentation: Examination degraded by motion artifact. Standard.  Lowest well-formed disc space labeled Latoya L5-S1 level. Alignment: Physiologic with preservation of Latoya normal lumbar lordosis. No listhesis. Vertebrae: Vertebral body height maintained without acute or chronic fracture. Bone marrow signal  intensity diffusely decreased on T1 weighted sequence, nonspecific, but most commonly related to anemia, smoking or obesity. Small probable atypical hemangioma noted within Latoya L3 vertebral body, stable. No other discrete or worrisome osseous lesions. Mild reactive marrow edema present about Latoya L4-5 facets bilaterally, indeterminate, and could be related to facet osteoarthritis. Possible septic arthritis not entirely excluded. No other convincing evidence for osteomyelitis discitis elsewhere within Latoya lumbar spine. Conus medullaris and cauda equina: Conus extends to Latoya L1 level. Conus and cauda equina appear normal. Paraspinal and other soft tissues: Diffuse edema present within Latoya lower posterior paraspinous soft tissues, adjacent to Latoya L4-5 and L5-S1 facets, greater on Latoya left. No soft tissue collections. Disc levels: T12-L1: Small right paracentral disc protrusion flattens Latoya right ventral thecal sac. Mild right-sided facet hypertrophy. No spinal stenosis. Foramina remain patent. L1-2:  Unremarkable. L2-3:  Unremarkable. L3-4:  Unremarkable. L4-5: Small central disc protrusion with caudad angulation minimally indents Latoya ventral thecal sac. Mild to moderate facet hypertrophy with associated reactive marrow edema. No significant spinal stenosis. Foramina remain patent. L5-S1: Normal interspace. Mild to moderate right worse than left facet hypertrophy. No significant spinal stenosis. Foramina remain patent. IMPRESSION: 1. Mild reactive marrow edema about Latoya L4-5 facets bilaterally. While these findings could be secondary to facet osteoarthritis, possible septic arthritis could be considered in this clinical setting. Associated soft tissue edema within Latoya lower posterior paraspinous soft tissues. No soft tissue collections. 2. Small disc protrusions at L4-5 and T12-L1 without stenosis. Electronically Signed   By: Rise Mu M.D.   On: 10/08/2021 01:48     No future appointments.    LOS: 5  days

## 2021-10-16 NOTE — Progress Notes (Signed)
[facility]  Date of Admission:  10/11/2021      Total days of antibiotics 6  Micafungin 6/08 >> current  Vancomycin 6/07 >> current   Cefepime 6/07 >> current                   ASSESSMENT: Latoya DecampHeather [name] is a 36 y.o. female with history of disseminated MRSA infection admitted here with new SOB, fevers and on 6/06 with candida albicans bacteremia, larger tricuspid valve vegetation and concern on imaging for new septic arthritis at L4-5.  Dr. Cliffton AstersLightfoot following for angiovac debulking and PFO closure.   Continue cefepime for 7d to cover for possible pneumonia. (End 6/13) Continue vancomycin  Continue micafungin --> will need eye exam at some point to rule out fungal endophthalmitis.   OK to place PICC line tomorrow 6/13 if no growth from repeat cx.    Person with injection drug use - Hep c Ab reactive with most recent RNA negative in March 2023 indicating either treatment or spontaneous clearance. Hep B sAg negative. HIV non-reactive. Would like to vaccinate hep A/B with first dose while inpatient.     PLAN: OK for PICC tomorrow 6/13 as long as blood cultures from 6/10 still negative.  Continue cefepime through 6/13 Continue vancomycin + Micafungin Follow for TCTS intervention    Principal Problem:   Acute saddle pulmonary embolism (HCC) Active Problems:   Lumbar discitis   Endocarditis of tricuspid valve   IV drug abuse (HCC)   Hyponatremia   MRSA bacteremia   Thrombocytopenia (HCC)   Pneumonia of left lower lobe due to infectious organism   Candidemia (HCC)   Pleuritic chest pain    benzonatate  200 mg Oral TID   enoxaparin (LOVENOX) injection  80 mg Subcutaneous Q12H   ferrous gluconate  324 mg Oral Q breakfast   gabapentin  300 mg Oral TID   pantoprazole  40 mg Oral Daily   pregabalin  75 mg Oral TID    SUBJECTIVE: Still with chest pains with moderate deep breaths.  Was taking her PO linezolid prior to current  hospitalization at last office follow up with Dr. Renold DonVu on 5/30. At that time reported no SOB but still sore throat/sore lungs and coughing. Back pain was better at that visit too.  Planned for 2 more weeks PO abx then re-scan to determine further course of action.   Readmitted on 6/05 with new saddle pulmonary embolus and new septic arthritis of the lumbar spine. Repeated blood cultures returned with candida growing - started on micafungin (QTc too high for fluc) in addition to Vanc/cefepime.    Review of Systems: ROS  Allergies  Allergen Reactions   Naltrexone Anxiety    Severe anxiety Restlessness  Vomiting     OBJECTIVE: Vitals:   10/16/21 0015 10/16/21 0443 10/16/21 0745 10/16/21 1154  BP: (!) 142/81 118/89 130/89 107/79  Pulse: (!) 108 (!) 109 (!) 109   Resp: 18 16 16 14   Temp: 100 F (37.8 C) 98.5 F (36.9 C) 99.5 F (37.5 C) 98.2 F (36.8 C)  TempSrc: Oral Oral Oral Axillary  SpO2: 98% 97% 100% 96%  Weight:      Height:       Body mass index is 29.41 kg/m.  Physical Exam  Lab Results Lab Results  Component Value Date   WBC 14.9 (H) 10/16/2021   HGB 8.6 (L) 10/16/2021   HCT 28.8 (L) 10/16/2021  MCV 77.8 (L) 10/16/2021   PLT 251 10/16/2021    Lab Results  Component Value Date   CREATININE 0.56 10/16/2021   BUN 8 10/16/2021   NA 134 (L) 10/16/2021   K 5.1 10/16/2021   CL 106 10/16/2021   CO2 15 (L) 10/16/2021    Lab Results  Component Value Date   ALT 9 10/11/2021   AST 9 (L) 10/11/2021   ALKPHOS 45 10/11/2021   BILITOT 1.0 10/11/2021     Microbiology: Recent Results (from the past 240 hour(s))  Culture, blood (Routine X 2) w Reflex to ID Panel     Status: Abnormal (Preliminary result)   Collection Time: 10/11/21  2:57 AM   Specimen: BLOOD LEFT HAND  Result Value Ref Range Status   Specimen Description BLOOD LEFT HAND  Final   Special Requests   Final    AEROBIC BOTTLE ONLY Blood Culture results may not be optimal due to an excessive volume  of blood received in culture bottles   Culture  Setup Time (A)  Final    YEAST AEROBIC BOTTLE ONLY CRITICAL RESULT CALLED TO, READ BACK BY AND VERIFIED WITH: PHARMD CARLA JARDIN ON 10/12/21 @ 1723 BY DRT    Culture (A)  Final    CANDIDA ALBICANS Sent to [facility] for further susceptibility testing. Performed at [facility], [address], [place], [place] [address]    Report Status PENDING  Incomplete  Culture, blood (Routine X 2) w Reflex to ID Panel     Status: Abnormal (Preliminary result)   Collection Time: 10/11/21  2:57 AM   Specimen: BLOOD LEFT HAND  Result Value Ref Range Status   Specimen Description BLOOD LEFT HAND  Final   Special Requests   Final    AEROBIC BOTTLE ONLY Blood Culture results may not be optimal due to an excessive volume of blood received in culture bottles   Culture  Setup Time   Final    YEAST WITH PSEUDOHYPHAE AEROBIC BOTTLE ONLY CRITICAL RESULT CALLED TO, READ BACK BY AND VERIFIED WITH: PHARMD CARLA JARDIN ON 10/12/21 @ 1837 BY DRT Performed at [facility], [address] [place], [place] [address]    Culture CANDIDA ALBICANS (A)  Final   Report Status PENDING  Incomplete  Blood Culture ID Panel (Reflexed)     Status: Abnormal   Collection Time: 10/11/21  2:57 AM  Result Value Ref Range Status   Enterococcus faecalis NOT DETECTED NOT DETECTED Final   Enterococcus Faecium NOT DETECTED NOT DETECTED Final   Listeria monocytogenes NOT DETECTED NOT DETECTED Final   Staphylococcus species NOT DETECTED NOT DETECTED Final   Staphylococcus aureus (BCID) NOT DETECTED NOT DETECTED Final   Staphylococcus epidermidis NOT DETECTED NOT DETECTED Final   Staphylococcus lugdunensis NOT DETECTED NOT DETECTED Final   Streptococcus species NOT DETECTED NOT DETECTED Final   Streptococcus agalactiae NOT DETECTED NOT DETECTED Final   Streptococcus pneumoniae NOT DETECTED NOT DETECTED Final   Streptococcus pyogenes NOT DETECTED NOT DETECTED Final    A.calcoaceticus-baumannii NOT DETECTED NOT DETECTED Final   Bacteroides fragilis NOT DETECTED NOT DETECTED Final   Enterobacterales NOT DETECTED NOT DETECTED Final   Enterobacter cloacae complex NOT DETECTED NOT DETECTED Final   Escherichia coli NOT DETECTED NOT DETECTED Final   Klebsiella aerogenes NOT DETECTED NOT DETECTED Final   Klebsiella oxytoca NOT DETECTED NOT DETECTED Final   Klebsiella pneumoniae NOT DETECTED NOT DETECTED Final   Proteus species NOT DETECTED NOT DETECTED Final  Salmonella species NOT DETECTED NOT DETECTED Final   Serratia marcescens NOT DETECTED NOT DETECTED Final   Haemophilus influenzae NOT DETECTED NOT DETECTED Final   Neisseria meningitidis NOT DETECTED NOT DETECTED Final   Pseudomonas aeruginosa NOT DETECTED NOT DETECTED Final   Stenotrophomonas maltophilia NOT DETECTED NOT DETECTED Final   Candida albicans DETECTED (A) NOT DETECTED Final    Comment: CRITICAL RESULT CALLED TO, READ BACK BY AND VERIFIED WITH: PHARMD CARLA JARDIN ON 10/12/21 @ 1723 BY DRT    Candida auris NOT DETECTED NOT DETECTED Final   Candida glabrata NOT DETECTED NOT DETECTED Final   Candida krusei NOT DETECTED NOT DETECTED Final   Candida parapsilosis NOT DETECTED NOT DETECTED Final   Candida tropicalis NOT DETECTED NOT DETECTED Final   Cryptococcus neoformans/gattii NOT DETECTED NOT DETECTED Final    Comment: Performed at [facility], [address] [place], [place] [address]  MRSA Next Gen by PCR, Nasal     Status: None   Collection Time: 10/11/21  9:58 AM   Specimen: Nasal Mucosa; Nasal Swab  Result Value Ref Range Status   MRSA by PCR Next Gen NOT DETECTED NOT DETECTED Final    Comment: (NOTE) The GeneXpert MRSA Assay (FDA approved for NASAL specimens only), is one component of a comprehensive MRSA colonization surveillance program. It is not intended to diagnose MRSA infection nor to guide or monitor treatment for MRSA infections. Test performance is not FDA approved  in patients less than 14 years old. Performed at [facility], [address] [place], [place] [address]   Expectorated Sputum Assessment w Gram Stain, Rflx to Resp Cult     Status: None (Preliminary result)   Collection Time: 10/13/21  6:11 AM   Specimen: Expectorated Sputum  Result Value Ref Range Status   Specimen Description EXPECTORATED SPUTUM  Final   Special Requests NONE  Final   Sputum evaluation   Final    THIS SPECIMEN IS ACCEPTABLE FOR SPUTUM CULTURE Performed at [facility], [address], [place], [place] [address]    Report Status PENDING  Incomplete  Culture, Respiratory w Gram Stain     Status: None   Collection Time: 10/13/21  6:11 AM  Result Value Ref Range Status   Specimen Description EXPECTORATED SPUTUM  Final   Special Requests NONE Reflexed from [id number]  Final   Gram Stain   Final    FEW SQUAMOUS EPITHELIAL CELLS PRESENT RARE WBC PRESENT, PREDOMINANTLY MONONUCLEAR MODERATE GRAM NEGATIVE RODS FEW GRAM POSITIVE COCCI IN PAIRS    Culture   Final    FEW Normal respiratory flora-no Staph aureus or Pseudomonas seen Performed at [facility], [address] [place], [place] [address]    Report Status 10/16/2021 FINAL  Final  Culture, blood (Routine X 2) w Reflex to ID Panel     Status: None (Preliminary result)   Collection Time: 10/14/21  1:43 PM   Specimen: BLOOD LEFT ARM  Result Value Ref Range Status   Specimen Description BLOOD LEFT ARM  Final   Special Requests   Final    BOTTLES DRAWN AEROBIC AND ANAEROBIC Blood Culture results may not be optimal due to an inadequate volume of blood received in culture bottles   Culture   Final    NO GROWTH 2 DAYS Performed at [facility], [address], [place], [place] [address]    Report Status PENDING  Incomplete  Culture, blood (Routine X 2) w  Reflex to ID Panel     Status: None (Preliminary result)   Collection Time: 10/14/21  1:44 PM   Specimen: BLOOD LEFT HAND  Result Value Ref  Range Status   Specimen Description BLOOD LEFT HAND  Final   Special Requests   Final    BOTTLES DRAWN AEROBIC ONLY Blood Culture results may not be optimal due to an inadequate volume of blood received in culture bottles   Culture   Final    NO GROWTH 2 DAYS Performed at [facility], [address], [place], [place] [address]    Report Status PENDING  Incomplete     Rexene Alberts, MSN, NP-C [facility]  [name] Pager: [phone] Office: [phone] [facility] Main Line: [phone] *Secure Chat Communication Welcome

## 2021-10-16 NOTE — Progress Notes (Signed)
PROGRESS NOTE    Latoya [name]  ZOX:[id number] DOB: 08-Oct-1985 DOA: 10/11/2021 PCP: Pcp, No   Brief Narrative: Latoya [name] is a 36 y.o. female with a history of MSSA tricuspid valve endocarditis, MRSA L4-L5 facet  join septic discitis in 07/2019 and more recently diagnosis of MRSA bacteremia with infective endocarditis and tricuspid valve vegetation diagnosed 07/2021. Patient presented from [facility] for saddle pulmonary embolism. Blood cultures growing candida albicans. Patient started on micafungin. PICC placement deferred and is pending negative repeat blood cultures   Assessment and Plan: * Acute saddle pulmonary embolism (HCC) Noted on CTA chest. Acute on chronic diagnosis while on Eliquis PO. Started on Heparin IV. RV heart strain noted on imaging. Transthoracic Echocardiogram obtained which showed stable RV dysfunction from prior. Complicated by TV endocarditis with history of septic emboli. PCCM consulted with recommendations for continued anticoagulation and no thrombectomy. On room air. Hematology recommending Lovenox for treatment. Heparin not therapeutic. No procedures anticipated in the near future. Transitioned to Lovenox on 6/10. -Continue Lovenox  Candidemia (HCC) New diagnosis with blood culture (6/7) resulting yeast.Patient started on micafungin IV. Leukocytosis improving. -Continue micafungin IV -Follow-up repeat blood cultures (6/10)  Lumbar discitis Noted on imaging. ID consulted. Neurosurgery curbsided. Recommendations for IR aspiration/biopsy. IR unable to perform secondary to no target for aspiration/biopsy. In setting of persistent fevers concerning for persistent bacteremia. -See problem, Endocarditis of tricuspid valve  Endocarditis of tricuspid valve Previously diagnosed. Initial plan for angio vac which was canceled secondary to PFO. Patient was treated for this as an inpatient and discharged on linezolid. Vancomycin and Cefepime restarted on  admission. ID consulted. 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 -ID recommendations: continue Vancomycin/Cefepime, follow-up blood cultures -TCTS consulted on 6/8 for recommendations on persistent large TV vegetation. Plan to consider occlusion of her PFO for consideration of angio VAC debridement  Pneumonia of left lower lobe due to infectious organism Patient with a history of septic emboli on prior admission. Patient noted to have evidence of LLL pneumonia. Started on antibiotics as mentioned in problem, Endocarditis of tricuspid valve.  MRSA bacteremia -See problem, Endocarditis of tricuspid valve  IV drug abuse (HCC) History of. Patient reports continued abstinence since March of 2023. Patient reports not requiring resources for continued abstinence.  Thrombocytopenia (HCC) Mild. In setting of infection. Stable.  Hyponatremia Mild. Possibly in setting of acute illness/pain. Back to wnl.  Pleuritic chest pain Secondary to PE and persistent coughing. -Discontinue Hycodan PRN and start Tussionex PRN -Continue tessalon perles -Ibuprofen PRN    DVT prophylaxis: Heparin IV Code Status:   Code Status: Full Code Family Communication: None at bedside. Disposition Plan: Discharge SNF pending plan for continued antibiotics/antifungals and specialist final recommendations vs 6 week inpatient hospitalization   Consultants:  Infectious disease PCCM Hematology Cardiothoracic surgery  Procedures:  None  Antimicrobials: Vancomycin Cefepime Micafungin   Subjective: Patient is unhappy because Morphine IV has been discontinued. Patient states that she has chest pain from coughing. We discussed being aggressive with her coughing management. She states that the Hycodan does not help because it is a small dose; patient wants a larger dose. Explained that the hycodan is for her cough, not pain management. She states her Percocet is not  strong enough to manage her pain and that she needs the IV morphine.   Objective: BP 130/89 (BP Location: Left Arm)   Pulse (!) 109   Temp 99.5 F (37.5 C) (Oral)   Resp 16   Ht  5\' 6"  (1.676 m)   Wt 82.6 kg   SpO2 100%   BMI 29.41 kg/m   Examination:  General exam: Appears calm and comfortable Respiratory system: Respiratory effort normal. Gastrointestinal system: Abdomen is non-distended Central nervous system: Alert and oriented. No focal neurological deficits. Psychiatry: Judgement and insight appear normal. Mood & affect appropriate.    Data Reviewed: I have personally reviewed following labs and imaging studies  CBC Lab Results  Component Value Date   WBC 14.9 (H) 10/16/2021   RBC 3.70 (L) 10/16/2021   HGB 8.6 (L) 10/16/2021   HCT 28.8 (L) 10/16/2021   MCV 77.8 (L) 10/16/2021   MCH 23.2 (L) 10/16/2021   PLT 251 10/16/2021   MCHC 29.9 (L) 10/16/2021   RDW 20.4 (H) 10/16/2021   LYMPHSABS 3.1 10/11/2021   MONOABS 1.8 (H) 10/11/2021   EOSABS 0.2 10/11/2021   BASOSABS 0.1 10/11/2021     Last metabolic panel Lab Results  Component Value Date   NA 134 (L) 10/16/2021   K 5.1 10/16/2021   CL 106 10/16/2021   CO2 15 (L) 10/16/2021   BUN 8 10/16/2021   CREATININE 0.56 10/16/2021   GLUCOSE 99 10/16/2021   GFRNONAA >60 10/16/2021   GFRAA >60 08/07/2019   CALCIUM 7.9 (L) 10/16/2021   PHOS 3.5 09/13/2021   PROT 7.0 10/11/2021   ALBUMIN 2.8 (L) 10/11/2021   BILITOT 1.0 10/11/2021   ALKPHOS 45 10/11/2021   AST 9 (L) 10/11/2021   ALT 9 10/11/2021   ANIONGAP 13 10/16/2021    GFR: Estimated Creatinine Clearance: 107.3 mL/min (by C-G formula based on SCr of 0.56 mg/dL).  Recent Results (from the past 240 hour(s))  Culture, blood (Routine X 2) w Reflex to ID Panel     Status: Abnormal (Preliminary result)   Collection Time: 10/11/21  2:57 AM   Specimen: BLOOD LEFT HAND  Result Value Ref Range Status   Specimen Description BLOOD LEFT HAND  Final   Special  Requests   Final    AEROBIC BOTTLE ONLY Blood Culture results may not be optimal due to an excessive volume of blood received in culture bottles   Culture  Setup Time (A)  Final    YEAST AEROBIC BOTTLE ONLY CRITICAL RESULT CALLED TO, READ BACK BY AND VERIFIED WITH: PHARMD CARLA JARDIN ON 10/12/21 @ 1723 BY DRT    Culture (A)  Final    CANDIDA ALBICANS Sent to [facility] for further susceptibility testing. Performed at [facility], [address] [place], [place] [address]    Report Status PENDING  Incomplete  Culture, blood (Routine X 2) w Reflex to ID Panel     Status: Abnormal (Preliminary result)   Collection Time: 10/11/21  2:57 AM   Specimen: BLOOD LEFT HAND  Result Value Ref Range Status   Specimen Description BLOOD LEFT HAND  Final   Special Requests   Final    AEROBIC BOTTLE ONLY Blood Culture results may not be optimal due to an excessive volume of blood received in culture bottles   Culture  Setup Time   Final    YEAST WITH PSEUDOHYPHAE AEROBIC BOTTLE ONLY CRITICAL RESULT CALLED TO, READ BACK BY AND VERIFIED WITH: PHARMD CARLA JARDIN ON 10/12/21 @ 1837 BY DRT Performed at [facility], [address] [place], [place] [address]    Culture CANDIDA ALBICANS (A)  Final   Report Status PENDING  Incomplete  Blood Culture ID Panel (Reflexed)     Status: Abnormal  Collection Time: 10/11/21  2:57 AM  Result Value Ref Range Status   Enterococcus faecalis NOT DETECTED NOT DETECTED Final   Enterococcus Faecium NOT DETECTED NOT DETECTED Final   Listeria monocytogenes NOT DETECTED NOT DETECTED Final   Staphylococcus species NOT DETECTED NOT DETECTED Final   Staphylococcus aureus (BCID) NOT DETECTED NOT DETECTED Final   Staphylococcus epidermidis NOT DETECTED NOT DETECTED Final   Staphylococcus lugdunensis NOT DETECTED NOT DETECTED Final   Streptococcus species NOT DETECTED NOT DETECTED Final   Streptococcus agalactiae NOT DETECTED NOT DETECTED Final   Streptococcus  pneumoniae NOT DETECTED NOT DETECTED Final   Streptococcus pyogenes NOT DETECTED NOT DETECTED Final   A.calcoaceticus-baumannii NOT DETECTED NOT DETECTED Final   Bacteroides fragilis NOT DETECTED NOT DETECTED Final   Enterobacterales NOT DETECTED NOT DETECTED Final   Enterobacter cloacae complex NOT DETECTED NOT DETECTED Final   Escherichia coli NOT DETECTED NOT DETECTED Final   Klebsiella aerogenes NOT DETECTED NOT DETECTED Final   Klebsiella oxytoca NOT DETECTED NOT DETECTED Final   Klebsiella pneumoniae NOT DETECTED NOT DETECTED Final   Proteus species NOT DETECTED NOT DETECTED Final   Salmonella species NOT DETECTED NOT DETECTED Final   Serratia marcescens NOT DETECTED NOT DETECTED Final   Haemophilus influenzae NOT DETECTED NOT DETECTED Final   Neisseria meningitidis NOT DETECTED NOT DETECTED Final   Pseudomonas aeruginosa NOT DETECTED NOT DETECTED Final   Stenotrophomonas maltophilia NOT DETECTED NOT DETECTED Final   Candida albicans DETECTED (A) NOT DETECTED Final    Comment: CRITICAL RESULT CALLED TO, READ BACK BY AND VERIFIED WITH: PHARMD CARLA JARDIN ON 10/12/21 @ 1723 BY DRT    Candida auris NOT DETECTED NOT DETECTED Final   Candida glabrata NOT DETECTED NOT DETECTED Final   Candida krusei NOT DETECTED NOT DETECTED Final   Candida parapsilosis NOT DETECTED NOT DETECTED Final   Candida tropicalis NOT DETECTED NOT DETECTED Final   Cryptococcus neoformans/gattii NOT DETECTED NOT DETECTED Final    Comment: Performed at [facility], [address] [place], [place] [address]  MRSA Next Gen by PCR, Nasal     Status: None   Collection Time: 10/11/21  9:58 AM   Specimen: Nasal Mucosa; Nasal Swab  Result Value Ref Range Status   MRSA by PCR Next Gen NOT DETECTED NOT DETECTED Final    Comment: (NOTE) The GeneXpert MRSA Assay (FDA approved for NASAL specimens only), is one component of a comprehensive MRSA colonization surveillance program. It is not intended to diagnose MRSA  infection nor to guide or monitor treatment for MRSA infections. Test performance is not FDA approved in patients less than 35 years old. Performed at [facility], [address] [place], [place] [address]   Expectorated Sputum Assessment w Gram Stain, Rflx to Resp Cult     Status: None (Preliminary result)   Collection Time: 10/13/21  6:11 AM   Specimen: Expectorated Sputum  Result Value Ref Range Status   Specimen Description EXPECTORATED SPUTUM  Final   Special Requests NONE  Final   Sputum evaluation   Final    THIS SPECIMEN IS ACCEPTABLE FOR SPUTUM CULTURE Performed at [facility], [address], [place], [place] [address]    Report Status PENDING  Incomplete  Culture, Respiratory w Gram Stain     Status: None (Preliminary result)   Collection Time: 10/13/21  6:11 AM  Result Value Ref Range Status   Specimen Description EXPECTORATED SPUTUM  Final   Special Requests NONE Reflexed  from [id number]  Final   Gram Stain   Final    FEW SQUAMOUS EPITHELIAL CELLS PRESENT RARE WBC PRESENT, PREDOMINANTLY MONONUCLEAR MODERATE GRAM NEGATIVE RODS FEW GRAM POSITIVE COCCI IN PAIRS    Culture   Final    CULTURE REINCUBATED FOR BETTER GROWTH Performed at [facility], [address] [place], [place] [address]    Report Status PENDING  Incomplete  Culture, blood (Routine X 2) w Reflex to ID Panel     Status: None (Preliminary result)   Collection Time: 10/14/21  1:43 PM   Specimen: BLOOD LEFT ARM  Result Value Ref Range Status   Specimen Description BLOOD LEFT ARM  Final   Special Requests   Final    BOTTLES DRAWN AEROBIC AND ANAEROBIC Blood Culture results may not be optimal due to an inadequate volume of blood received in culture bottles   Culture   Final    NO GROWTH 2 DAYS Performed at [facility], [address], [place], [place] [address]    Report Status PENDING  Incomplete  Culture, blood (Routine X 2) w Reflex to ID Panel     Status: None (Preliminary  result)   Collection Time: 10/14/21  1:44 PM   Specimen: BLOOD LEFT HAND  Result Value Ref Range Status   Specimen Description BLOOD LEFT HAND  Final   Special Requests   Final    BOTTLES DRAWN AEROBIC ONLY Blood Culture results may not be optimal due to an inadequate volume of blood received in culture bottles   Culture   Final    NO GROWTH 2 DAYS Performed at [facility], [address], [place], [place] [address]    Report Status PENDING  Incomplete      Radiology Studies: No results found.    LOS: 5 days    Jacquelin Hawking, MD [facility] 10/16/2021, 10:04 AM   If 7PM-7AM, please contact night-coverage [email or web address]

## 2021-10-17 DIAGNOSIS — R7881 Bacteremia: Secondary | ICD-10-CM | POA: Diagnosis not present

## 2021-10-17 DIAGNOSIS — I079 Rheumatic tricuspid valve disease, unspecified: Secondary | ICD-10-CM | POA: Diagnosis not present

## 2021-10-17 DIAGNOSIS — B377 Candidal sepsis: Secondary | ICD-10-CM | POA: Diagnosis not present

## 2021-10-17 DIAGNOSIS — F1910 Other psychoactive substance abuse, uncomplicated: Secondary | ICD-10-CM | POA: Diagnosis not present

## 2021-10-17 DIAGNOSIS — I2692 Saddle embolus of pulmonary artery without acute cor pulmonale: Secondary | ICD-10-CM | POA: Diagnosis not present

## 2021-10-17 DIAGNOSIS — J189 Pneumonia, unspecified organism: Secondary | ICD-10-CM | POA: Diagnosis not present

## 2021-10-17 DIAGNOSIS — M4646 Discitis, unspecified, lumbar region: Secondary | ICD-10-CM | POA: Diagnosis not present

## 2021-10-17 DIAGNOSIS — B9562 Methicillin resistant Staphylococcus aureus infection as the cause of diseases classified elsewhere: Secondary | ICD-10-CM | POA: Diagnosis not present

## 2021-10-17 LAB — VANCOMYCIN, TROUGH: Vancomycin Tr: 14 ug/mL — ABNORMAL LOW (ref 15–20)

## 2021-10-17 LAB — CBC
HCT: 32.8 % — ABNORMAL LOW (ref 36.0–46.0)
Hemoglobin: 9.5 g/dL — ABNORMAL LOW (ref 12.0–15.0)
MCH: 23.1 pg — ABNORMAL LOW (ref 26.0–34.0)
MCHC: 29 g/dL — ABNORMAL LOW (ref 30.0–36.0)
MCV: 79.8 fL — ABNORMAL LOW (ref 80.0–100.0)
Platelets: 334 10*3/uL (ref 150–400)
RBC: 4.11 MIL/uL (ref 3.87–5.11)
RDW: 21.2 % — ABNORMAL HIGH (ref 11.5–15.5)
WBC: 16 10*3/uL — ABNORMAL HIGH (ref 4.0–10.5)
nRBC: 0.7 % — ABNORMAL HIGH (ref 0.0–0.2)

## 2021-10-17 LAB — BASIC METABOLIC PANEL
Anion gap: 10 (ref 5–15)
BUN: 7 mg/dL (ref 6–20)
CO2: 20 mmol/L — ABNORMAL LOW (ref 22–32)
Calcium: 8.4 mg/dL — ABNORMAL LOW (ref 8.9–10.3)
Chloride: 107 mmol/L (ref 98–111)
Creatinine, Ser: 0.66 mg/dL (ref 0.44–1.00)
GFR, Estimated: >60 mL/min (ref >60)
Glucose, Bld: 116 mg/dL — ABNORMAL HIGH (ref 70–99)
Potassium: 4.1 mmol/L (ref 3.5–5.1)
Sodium: 137 mmol/L (ref 135–145)

## 2021-10-17 LAB — EXPECTORATED SPUTUM ASSESSMENT W GRAM STAIN, RFLX TO RESP C

## 2021-10-17 LAB — VANCOMYCIN, PEAK: Vancomycin Pk: 40 ug/mL (ref 30–40)

## 2021-10-17 MED ORDER — VANCOMYCIN HCL 1250 MG/250ML IV SOLN
1250.0000 mg | Freq: Two times a day (BID) | INTRAVENOUS | Status: DC
Start: 2021-10-17 — End: 2021-10-18
  Administered 2021-10-17 – 2021-10-18 (×2): 1250 mg via INTRAVENOUS
  Filled 2021-10-17 (×3): qty 250

## 2021-10-17 MED ORDER — KETOROLAC TROMETHAMINE 15 MG/ML IJ SOLN
15.0000 mg | Freq: Four times a day (QID) | INTRAMUSCULAR | Status: AC | PRN
  Administered 2021-10-17: 15 mg via INTRAVENOUS
  Filled 2021-10-17: qty 1

## 2021-10-17 NOTE — Progress Notes (Signed)
ANTICOAGULATION CONSULT NOTE - Follow Up Consult  Pharmacy Consult for Enoxaparin Indication: pulmonary embolus  Allergies  Allergen Reactions   Naltrexone Anxiety    Severe anxiety Restlessness  Vomiting     Patient Measurements: Height: 5\' 6"  (167.6 cm) Weight: 82.6 kg (182 lb 3.2 oz) IBW/kg (Calculated) : 59.3 Heparin Dosing Weight: 76.7 kg  Vital Signs: Temp: 99.9 F (37.7 C) (06/13 0808) Temp Source: Oral (06/13 0808) BP: 119/80 (06/13 0808) Pulse Rate: 107 (06/13 0808)  Labs: Recent Labs    10/15/21 0020 10/16/21 0650 10/17/21 0149  HGB 8.1* 8.6* 9.5*  HCT 26.8* 28.8* 32.8*  PLT 175 251 334  CREATININE 0.68 0.56 0.66     Estimated Creatinine Clearance: 107.3 mL/min (by C-G formula based on SCr of 0.66 mg/dL).   Medications:  Scheduled:   benzonatate  200 mg Oral TID   enoxaparin (LOVENOX) injection  80 mg Subcutaneous Q12H   ferrous gluconate  324 mg Oral Q breakfast   gabapentin  300 mg Oral TID   pantoprazole  40 mg Oral Daily   pregabalin  75 mg Oral TID   Infusions:   sodium chloride 10 mL/hr at 10/16/21 1945   micafungin (MYCAMINE) 150 mg in sodium chloride 0.9 % 100 mL IVPB     vancomycin 1,500 mg (10/17/21 0948)    Assessment: 36 yo F transferred from OSH with PE. Pt started on IV heparin drip. Pt has hx septic pulmonary emboli and reports adherence to PTA Eliquis. Hematology recommended  enoxaparin for PE treatment and pharmacy dosing  -hg= 9.5, SCr= 0.66 -enoxaparin copay= $0  Goal of Therapy:  Heparin level 0.3-0.7 units/ml Monitor platelets by anticoagulation protocol: Yes   Plan:  Continue Enoxaparin 80 mg SQ q12h. Will follow patient progress  20, PharmD Clinical Pharmacist **Pharmacist phone directory can now be found on [email or web address] ([email or web address]).  Listed under [facility].

## 2021-10-17 NOTE — Progress Notes (Signed)
[facility]  Date of Admission:  10/11/2021      Total days of antibiotics 7  Micafungin 6/08 >> current  Vancomycin 6/07 >> current   Cefepime 6/07 >> 6/13                   ASSESSMENT: Latoya [name] is a 36 y.o. female with history of disseminated MRSA infection admitted here with new SOB, fevers and on 6/06 with candida albicans bacteremia, larger tricuspid valve vegetation and concern on imaging for new septic arthritis at L4-5.  Dr. Cliffton Asters following for angiovac debulking and PFO closure.   Continue cefepime for 7d to cover for possible pneumonia. (End 6/13) Continue vancomycin given she was on linezolid coming in to current hospital stay and uncertain if this is still part of the infectious picture or not. Discussed with [name] that we are stuck presuming it still may be given enlarging vegetation and possible false negative blood cultures on linezolid at the draw time.   Discitis - back is not terribly painful at this time, mostly chest pains.   Continue micafungin --> will need eye exam at some point to rule out fungal endophthalmitis. OK to place PICC line if needed for access after 6/13. Prefer PIVs if they can hold.   Uncontrolled acute pain 2/2 PE --> will reach out to Dr. Mal Misty to see about other pain adjuncts (?scheduled ketorolac injections for a few days).   Person with injection drug use - has abstained since last hospital stay for MRSA endocarditis.  Hep c Ab reactive with most recent RNA negative in March 2023 indicating either treatment or spontaneous clearance. Hep B sAg negative. HIV non-reactive. Would like to vaccinate hep A/B with first dose while inpatient as she nears closer to discharge planning.     PLAN: OK for PICC after 6/13 if PIVs cannot hold out Continue cefepime through 6/13 Continue vancomycin + Micafungin Follow for TCTS intervention  Consider scheduled toradol injections for uncontrolled pain.     Principal Problem:   Acute saddle pulmonary embolism (HCC) Active Problems:   Lumbar discitis   Endocarditis of tricuspid valve   IV drug abuse (HCC)   Hyponatremia   MRSA bacteremia   Thrombocytopenia (HCC)   Pneumonia of left lower lobe due to infectious organism   Candidemia (HCC)   Pleuritic chest pain    benzonatate  200 mg Oral TID   enoxaparin (LOVENOX) injection  80 mg Subcutaneous Q12H   ferrous gluconate  324 mg Oral Q breakfast   gabapentin  300 mg Oral TID   pantoprazole  40 mg Oral Daily   pregabalin  75 mg Oral TID    SUBJECTIVE: Still with chest pains with moderate deep breaths.  Not getting much relief from the PO oxy 2 tabs as needed. Taking it pretty scheduled at this point. Hopeful we can try to change it around to help with the pain. Back is not terribly bothersome for her at this time.  Temperature dysregulations with hot/cold sensations. Frequently adjusting thermostat. Trying to walk around as much as she can tolerate.  She wonders as to why she is on vancomycin still.    Review of Systems: Review of Systems  Constitutional:  Positive for chills, diaphoresis and fever.  Eyes: Negative.   Respiratory:  Positive for cough. Negative for sputum production and shortness of breath.   Cardiovascular:  Positive for chest pain.  Gastrointestinal:  Negative for abdominal  pain, diarrhea and vomiting.  Genitourinary: Negative.   Musculoskeletal:  Positive for back pain ("not too bad").  Skin:  Negative for rash.    Allergies  Allergen Reactions   Naltrexone Anxiety    Severe anxiety Restlessness  Vomiting     OBJECTIVE: Vitals:   10/16/21 2022 10/17/21 0017 10/17/21 0434 10/17/21 0808  BP: 103/83 122/82 122/88 119/80  Pulse: 90 (!) 106 (!) 102 (!) 107  Resp: 16 16 18 14   Temp: 98.5 F (36.9 C) 98.2 F (36.8 C) 99.9 F (37.7 C) 99.9 F (37.7 C)  TempSrc: Oral Oral Oral Oral  SpO2: 92% 100% 100% 94%  Weight:      Height:       Body mass  index is 29.41 kg/m.  Physical Exam Vitals reviewed.  Constitutional:      Appearance: Normal appearance. She is not ill-appearing.  HENT:     Mouth/Throat:     Mouth: Mucous membranes are moist.     Pharynx: Oropharynx is clear.  Eyes:     General: No scleral icterus. Cardiovascular:     Rate and Rhythm: Normal rate and regular rhythm.     Heart sounds: Murmur heard.  Pulmonary:     Effort: Pulmonary effort is normal.  Skin:    General: Skin is warm and dry.  Neurological:     Mental Status: She is oriented to person, place, and time.  Psychiatric:        Mood and Affect: Mood normal.        Thought Content: Thought content normal.     Lab Results Lab Results  Component Value Date   WBC 16.0 (H) 10/17/2021   HGB 9.5 (L) 10/17/2021   HCT 32.8 (L) 10/17/2021   MCV 79.8 (L) 10/17/2021   PLT 334 10/17/2021    Lab Results  Component Value Date   CREATININE 0.66 10/17/2021   BUN 7 10/17/2021   NA 137 10/17/2021   K 4.1 10/17/2021   CL 107 10/17/2021   CO2 20 (L) 10/17/2021    Lab Results  Component Value Date   ALT 9 10/11/2021   AST 9 (L) 10/11/2021   ALKPHOS 45 10/11/2021   BILITOT 1.0 10/11/2021     Microbiology: Recent Results (from the past 240 hour(s))  Culture, blood (Routine X 2) w Reflex to ID Panel     Status: Abnormal (Preliminary result)   Collection Time: 10/11/21  2:57 AM   Specimen: BLOOD LEFT HAND  Result Value Ref Range Status   Specimen Description BLOOD LEFT HAND  Final   Special Requests   Final    AEROBIC BOTTLE ONLY Blood Culture results may not be optimal due to an excessive volume of blood received in culture bottles   Culture  Setup Time (A)  Final    YEAST AEROBIC BOTTLE ONLY CRITICAL RESULT CALLED TO, READ BACK BY AND VERIFIED WITH: PHARMD CARLA JARDIN ON 10/12/21 @ 1723 BY DRT    Culture (A)  Final    CANDIDA ALBICANS Sent to [facility] for further susceptibility testing. Performed at [facility], [address]  [place], [place]    Report Status PENDING  Incomplete  Culture, blood (Routine X 2) w Reflex to ID Panel     Status: Abnormal (Preliminary result)   Collection Time: 10/11/21  2:57 AM   Specimen: BLOOD LEFT HAND  Result Value Ref Range Status   Specimen Description BLOOD LEFT HAND  Final   Special Requests  Final    AEROBIC BOTTLE ONLY Blood Culture results may not be optimal due to an excessive volume of blood received in culture bottles   Culture  Setup Time   Final    YEAST WITH PSEUDOHYPHAE AEROBIC BOTTLE ONLY CRITICAL RESULT CALLED TO, READ BACK BY AND VERIFIED WITH: PHARMD CARLA JARDIN ON 10/12/21 @ 1837 BY DRT Performed at [facility], [address] [place], [place] [address]    Culture CANDIDA ALBICANS (A)  Final   Report Status PENDING  Incomplete  Blood Culture ID Panel (Reflexed)     Status: Abnormal   Collection Time: 10/11/21  2:57 AM  Result Value Ref Range Status   Enterococcus faecalis NOT DETECTED NOT DETECTED Final   Enterococcus Faecium NOT DETECTED NOT DETECTED Final   Listeria monocytogenes NOT DETECTED NOT DETECTED Final   Staphylococcus species NOT DETECTED NOT DETECTED Final   Staphylococcus aureus (BCID) NOT DETECTED NOT DETECTED Final   Staphylococcus epidermidis NOT DETECTED NOT DETECTED Final   Staphylococcus lugdunensis NOT DETECTED NOT DETECTED Final   Streptococcus species NOT DETECTED NOT DETECTED Final   Streptococcus agalactiae NOT DETECTED NOT DETECTED Final   Streptococcus pneumoniae NOT DETECTED NOT DETECTED Final   Streptococcus pyogenes NOT DETECTED NOT DETECTED Final   A.calcoaceticus-baumannii NOT DETECTED NOT DETECTED Final   Bacteroides fragilis NOT DETECTED NOT DETECTED Final   Enterobacterales NOT DETECTED NOT DETECTED Final   Enterobacter cloacae complex NOT DETECTED NOT DETECTED Final   Escherichia coli NOT DETECTED NOT DETECTED Final   Klebsiella aerogenes NOT DETECTED NOT DETECTED Final   Klebsiella oxytoca NOT  DETECTED NOT DETECTED Final   Klebsiella pneumoniae NOT DETECTED NOT DETECTED Final   Proteus species NOT DETECTED NOT DETECTED Final   Salmonella species NOT DETECTED NOT DETECTED Final   Serratia marcescens NOT DETECTED NOT DETECTED Final   Haemophilus influenzae NOT DETECTED NOT DETECTED Final   Neisseria meningitidis NOT DETECTED NOT DETECTED Final   Pseudomonas aeruginosa NOT DETECTED NOT DETECTED Final   Stenotrophomonas maltophilia NOT DETECTED NOT DETECTED Final   Candida albicans DETECTED (A) NOT DETECTED Final    Comment: CRITICAL RESULT CALLED TO, READ BACK BY AND VERIFIED WITH: PHARMD CARLA JARDIN ON 10/12/21 @ 1723 BY DRT    Candida auris NOT DETECTED NOT DETECTED Final   Candida glabrata NOT DETECTED NOT DETECTED Final   Candida krusei NOT DETECTED NOT DETECTED Final   Candida parapsilosis NOT DETECTED NOT DETECTED Final   Candida tropicalis NOT DETECTED NOT DETECTED Final   Cryptococcus neoformans/gattii NOT DETECTED NOT DETECTED Final    Comment: Performed at [facility], [address], [place], [place] [address]  MRSA Next Gen by PCR, Nasal     Status: None   Collection Time: 10/11/21  9:58 AM   Specimen: Nasal Mucosa; Nasal Swab  Result Value Ref Range Status   MRSA by PCR Next Gen NOT DETECTED NOT DETECTED Final    Comment: (NOTE) The GeneXpert MRSA Assay (FDA approved for NASAL specimens only), is one component of a comprehensive MRSA colonization surveillance program. It is not intended to diagnose MRSA infection nor to guide or monitor treatment for MRSA infections. Test performance is not FDA approved in patients less than 73 years old. Performed at [facility], [address] [place], [place] [address]   Expectorated Sputum Assessment w Gram Stain, Rflx to Resp Cult     Status: None (Preliminary result)   Collection Time: 10/13/21  6:11 AM  Specimen: Expectorated Sputum  Result Value Ref Range Status   Specimen Description EXPECTORATED  SPUTUM  Final   Special Requests NONE  Final   Sputum evaluation   Final    THIS SPECIMEN IS ACCEPTABLE FOR SPUTUM CULTURE Performed at [facility], [address] [place], [place] [address]    Report Status PENDING  Incomplete  Culture, Respiratory w Gram Stain     Status: None   Collection Time: 10/13/21  6:11 AM  Result Value Ref Range Status   Specimen Description EXPECTORATED SPUTUM  Final   Special Requests NONE Reflexed from [id number]  Final   Gram Stain   Final    FEW SQUAMOUS EPITHELIAL CELLS PRESENT RARE WBC PRESENT, PREDOMINANTLY MONONUCLEAR MODERATE GRAM NEGATIVE RODS FEW GRAM POSITIVE COCCI IN PAIRS    Culture   Final    FEW Normal respiratory flora-no Staph aureus or Pseudomonas seen Performed at [facility], [address] [place], [place] [address]    Report Status 10/16/2021 FINAL  Final  Culture, blood (Routine X 2) w Reflex to ID Panel     Status: None (Preliminary result)   Collection Time: 10/14/21  1:43 PM   Specimen: BLOOD LEFT ARM  Result Value Ref Range Status   Specimen Description BLOOD LEFT ARM  Final   Special Requests   Final    BOTTLES DRAWN AEROBIC AND ANAEROBIC Blood Culture results may not be optimal due to an inadequate volume of blood received in culture bottles   Culture   Final    NO GROWTH 3 DAYS Performed at [facility], [address] [place], [place] [address]    Report Status PENDING  Incomplete  Culture, blood (Routine X 2) w Reflex to ID Panel     Status: None (Preliminary result)   Collection Time: 10/14/21  1:44 PM   Specimen: BLOOD LEFT HAND  Result Value Ref Range Status   Specimen Description BLOOD LEFT HAND  Final   Special Requests   Final    BOTTLES DRAWN AEROBIC ONLY Blood Culture results may not be optimal due to an inadequate volume of blood received in culture bottles   Culture   Final    NO GROWTH 3 DAYS Performed at [facility], [address] [place], [place] [address]    Report  Status PENDING  Incomplete     Rexene AlbertsStephanie Elyas Villamor, MSN, NP-C [facility]  [email or web address] Pager: [phone] Office: [phone] [facility] Main Line: [phone] *Secure Chat Communication Welcome

## 2021-10-17 NOTE — Progress Notes (Signed)
Pharmacy Antibiotic Note  Latoya [name] is a 36 y.o. female admitted on 10/11/2021 with CP and back pain. Pt noted to have acute PE. Pt has significant history of MSSA and MRSA bacteremia c/b endocarditis and lumbar discitis. Pt recently completed 6 weeks of vancomycin and was started on linezolid after. Pharmacy has been consulted for vancomycin. Micafungin ordered as well with C. Albicans growing in blood. ID following.  -vancomycin peak= 40, trough= 12; calculated AUC= 748  -SCr= 0.66, WBC= 16 -plans noted for PICC today  Plan: -Change vancomycin to 1250mg  IV q12h -Will follow renal function and clinical progress   Height: 5\' 6"  (167.6 cm) Weight: 82.6 kg (182 lb 3.2 oz) IBW/kg (Calculated) : 59.3  Temp (24hrs), Avg:98.8 F (37.1 C), Min:97.9 F (36.6 C), Max:99.9 F (37.7 C)  Recent Labs  Lab 10/11/21 0257 10/12/21 0542 10/13/21 0553 10/13/21 1211 10/14/21 0115 10/15/21 0020 10/16/21 0650 10/17/21 0149 10/17/21 0904  WBC 16.1*   < > 17.4*  --  11.7* 13.6* 14.9* 16.0*  --   CREATININE 0.70   < > 0.70  --  0.62 0.68 0.56 0.66  --   LATICACIDVEN 1.8  --   --   --   --   --   --   --   --   VANCOTROUGH  --   --   --  11*  --   --   --   --  14*  VANCOPEAK  --   --  16*  --   --   --   --  40  --    < > = values in this interval not displayed.     Estimated Creatinine Clearance: 107.3 mL/min (by C-G formula based on SCr of 0.66 mg/dL).    Allergies  Allergen Reactions   Naltrexone Anxiety    Severe anxiety Restlessness  Vomiting     Antimicrobials this admission: Vancomycin 6/7 >>  Cefepime 6/7 >>  Micafungin 6/8 >>  Microbiology results: 6/7 BCx: C. Albicans 6/7 MRSA PCR: negative 6/9 resp- normal flora 6/10 blood x2- ngtd  Thank you for allowing pharmacy to be a part of this patient's care.  8/7, PharmD Clinical Pharmacist **Pharmacist phone directory can now be found on [email or web address] ([email or web address]).  Listed under [facility].  e

## 2021-10-17 NOTE — Progress Notes (Signed)
PROGRESS NOTE    Dulcey Riederer  GUY:[id number] DOB: [birth year] DOA: 10/11/2021 PCP: Pcp, No   Brief Narrative: Latoya [name] is a 36 y.o. female with a history of MSSA tricuspid valve endocarditis, MRSA L4-L5 facet  join septic discitis in 07/2019 and more recently diagnosis of MRSA bacteremia with infective endocarditis and tricuspid valve vegetation diagnosed 07/2021. Patient presented from [facility] for saddle pulmonary embolism. Blood cultures growing candida albicans. Patient started on micafungin. PICC placement deferred and is pending negative repeat blood cultures. Cardiothoracic surgery considering PFO closure/angio Vac for removal of TV vegetation.   Assessment and Plan: * Acute saddle pulmonary embolism (HCC) Noted on CTA chest. Acute on chronic diagnosis while on Eliquis PO. Started on Heparin IV. RV heart strain noted on imaging. Transthoracic Echocardiogram obtained which showed stable RV dysfunction from prior. Complicated by TV endocarditis with history of septic emboli. PCCM consulted with recommendations for continued anticoagulation and no thrombectomy. On room air. Hematology recommending Lovenox for treatment. Heparin not therapeutic. No procedures anticipated in the near future. Transitioned to Lovenox on 6/10. -Continue Lovenox  Candidemia (HCC) New diagnosis with blood culture (6/7) resulting yeast. Patient started on micafungin IV. Leukocytosis improving. -Continue micafungin IV -Follow-up repeat blood cultures (6/10) -PICC line if patient loses IV access  Lumbar discitis Noted on imaging. ID consulted. Neurosurgery curbsided. Recommendations for IR aspiration/biopsy. IR unable to perform secondary to no target for aspiration/biopsy. In setting of persistent fevers concerning for persistent bacteremia. -See problem, Endocarditis of tricuspid valve  Endocarditis of tricuspid valve Previously diagnosed. Initial plan for angio vac which was canceled secondary  to PFO. Patient was treated for this as an inpatient and discharged on linezolid. Vancomycin and Cefepime restarted on admission. ID consulted. 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 -ID recommendations: continue Vancomycin/Cefepime, follow-up blood cultures -TCTS consulted on 6/8 for recommendations on persistent large TV vegetation. Plan to consider occlusion of her PFO for consideration of angio VAC debridement  Pneumonia of left lower lobe due to infectious organism Patient with a history of septic emboli on prior admission. Patient noted to have evidence of LLL pneumonia. Started on antibiotics as mentioned in problem, Endocarditis of tricuspid valve.  MRSA bacteremia -See problem, Endocarditis of tricuspid valve  IV drug abuse (HCC) History of. Patient reports continued abstinence since March of 2023. Patient reports not requiring resources for continued abstinence.  Thrombocytopenia (HCC) Mild. In setting of infection. Stable.  Hyponatremia Mild. Possibly in setting of acute illness/pain. Back to wnl.  Pleuritic chest pain Secondary to PE and persistent coughing. -Discontinue Hycodan PRN and start Tussionex PRN -Continue tessalon perles -Ibuprofen PRN    DVT prophylaxis: Heparin IV Code Status:   Code Status: Full Code Family Communication: None at bedside. Disposition Plan: Discharge SNF pending plan for continued antibiotics/antifungals and specialist final recommendations vs 6 week inpatient hospitalization   Consultants:  Infectious disease PCCM Hematology Cardiothoracic surgery  Procedures:  None  Antimicrobials: Vancomycin Cefepime Micafungin   Subjective: Patient reports sweats overnight. Elevated temperature without fever overnight. Fever curve continues to trend down  Objective: BP 119/80 (BP Location: Left Arm)   Pulse (!) 107   Temp 99.9 F (37.7 C) (Oral)   Resp 14   Ht 5\' 6"  (1.676 m)    Wt 82.6 kg   SpO2 94%   BMI 29.41 kg/m   Examination:  General exam: Appears calm and comfortable. Respiratory system: Clear to auscultation. Respiratory effort normal. Cardiovascular system: S1 & S2 heard,  RRR. 2/6 systolic. Gastrointestinal system: Abdomen is nondistended, soft and nontender. Normal bowel sounds heard. Central nervous system: Alert and oriented. No focal neurological deficits. Musculoskeletal: Trace BLE edema. No calf tenderness Skin: No cyanosis. No rashes Psychiatry: Judgement and insight appear normal.   Data Reviewed: I have personally reviewed following labs and imaging studies  CBC Lab Results  Component Value Date   WBC 16.0 (H) 10/17/2021   RBC 4.11 10/17/2021   HGB 9.5 (L) 10/17/2021   HCT 32.8 (L) 10/17/2021   MCV 79.8 (L) 10/17/2021   MCH 23.1 (L) 10/17/2021   PLT 334 10/17/2021   MCHC 29.0 (L) 10/17/2021   RDW 21.2 (H) 10/17/2021   LYMPHSABS 3.1 10/11/2021   MONOABS 1.8 (H) 10/11/2021   EOSABS 0.2 10/11/2021   BASOSABS 0.1 10/11/2021     Last metabolic panel Lab Results  Component Value Date   NA 137 10/17/2021   K 4.1 10/17/2021   CL 107 10/17/2021   CO2 20 (L) 10/17/2021   BUN 7 10/17/2021   CREATININE 0.66 10/17/2021   GLUCOSE 116 (H) 10/17/2021   GFRNONAA >60 10/17/2021   GFRAA >60 08/07/2019   CALCIUM 8.4 (L) 10/17/2021   PHOS 3.5 09/13/2021   PROT 7.0 10/11/2021   ALBUMIN 2.8 (L) 10/11/2021   BILITOT 1.0 10/11/2021   ALKPHOS 45 10/11/2021   AST 9 (L) 10/11/2021   ALT 9 10/11/2021   ANIONGAP 10 10/17/2021    GFR: Estimated Creatinine Clearance: 107.3 mL/min (by C-G formula based on SCr of 0.66 mg/dL).  Recent Results (from the past 240 hour(s))  Culture, blood (Routine X 2) w Reflex to ID Panel     Status: Abnormal (Preliminary result)   Collection Time: 10/11/21  2:57 AM   Specimen: BLOOD LEFT HAND  Result Value Ref Range Status   Specimen Description BLOOD LEFT HAND  Final   Special Requests   Final     AEROBIC BOTTLE ONLY Blood Culture results may not be optimal due to an excessive volume of blood received in culture bottles   Culture  Setup Time (A)  Final    YEAST AEROBIC BOTTLE ONLY CRITICAL RESULT CALLED TO, READ BACK BY AND VERIFIED WITH: PHARMD CARLA JARDIN ON 10/12/21 @ 1723 BY DRT    Culture (A)  Final    CANDIDA ALBICANS Sent to [facility] for further susceptibility testing. Performed at [facility], [address], [place], [place] [address]    Report Status PENDING  Incomplete  Culture, blood (Routine X 2) w Reflex to ID Panel     Status: Abnormal (Preliminary result)   Collection Time: 10/11/21  2:57 AM   Specimen: BLOOD LEFT HAND  Result Value Ref Range Status   Specimen Description BLOOD LEFT HAND  Final   Special Requests   Final    AEROBIC BOTTLE ONLY Blood Culture results may not be optimal due to an excessive volume of blood received in culture bottles   Culture  Setup Time   Final    YEAST WITH PSEUDOHYPHAE AEROBIC BOTTLE ONLY CRITICAL RESULT CALLED TO, READ BACK BY AND VERIFIED WITH: PHARMD CARLA JARDIN ON 10/12/21 @ 1837 BY DRT Performed at [facility], [address], [place], [place] [address]    Culture CANDIDA ALBICANS (A)  Final   Report Status PENDING  Incomplete  Blood Culture ID Panel (Reflexed)     Status: Abnormal   Collection Time: 10/11/21  2:57 AM  Result Value Ref Range Status   Enterococcus  faecalis NOT DETECTED NOT DETECTED Final   Enterococcus Faecium NOT DETECTED NOT DETECTED Final   Listeria monocytogenes NOT DETECTED NOT DETECTED Final   Staphylococcus species NOT DETECTED NOT DETECTED Final   Staphylococcus aureus (BCID) NOT DETECTED NOT DETECTED Final   Staphylococcus epidermidis NOT DETECTED NOT DETECTED Final   Staphylococcus lugdunensis NOT DETECTED NOT DETECTED Final   Streptococcus species NOT DETECTED NOT DETECTED Final   Streptococcus agalactiae NOT DETECTED NOT DETECTED Final   Streptococcus pneumoniae NOT DETECTED NOT  DETECTED Final   Streptococcus pyogenes NOT DETECTED NOT DETECTED Final   A.calcoaceticus-baumannii NOT DETECTED NOT DETECTED Final   Bacteroides fragilis NOT DETECTED NOT DETECTED Final   Enterobacterales NOT DETECTED NOT DETECTED Final   Enterobacter cloacae complex NOT DETECTED NOT DETECTED Final   Escherichia coli NOT DETECTED NOT DETECTED Final   Klebsiella aerogenes NOT DETECTED NOT DETECTED Final   Klebsiella oxytoca NOT DETECTED NOT DETECTED Final   Klebsiella pneumoniae NOT DETECTED NOT DETECTED Final   Proteus species NOT DETECTED NOT DETECTED Final   Salmonella species NOT DETECTED NOT DETECTED Final   Serratia marcescens NOT DETECTED NOT DETECTED Final   Haemophilus influenzae NOT DETECTED NOT DETECTED Final   Neisseria meningitidis NOT DETECTED NOT DETECTED Final   Pseudomonas aeruginosa NOT DETECTED NOT DETECTED Final   Stenotrophomonas maltophilia NOT DETECTED NOT DETECTED Final   Candida albicans DETECTED (A) NOT DETECTED Final    Comment: CRITICAL RESULT CALLED TO, READ BACK BY AND VERIFIED WITH: PHARMD CARLA JARDIN ON 10/12/21 @ 1723 BY DRT    Candida auris NOT DETECTED NOT DETECTED Final   Candida glabrata NOT DETECTED NOT DETECTED Final   Candida krusei NOT DETECTED NOT DETECTED Final   Candida parapsilosis NOT DETECTED NOT DETECTED Final   Candida tropicalis NOT DETECTED NOT DETECTED Final   Cryptococcus neoformans/gattii NOT DETECTED NOT DETECTED Final    Comment: Performed at [facility], [address] [place], [place] [address]  MRSA Next Gen by PCR, Nasal     Status: None   Collection Time: 10/11/21  9:58 AM   Specimen: Nasal Mucosa; Nasal Swab  Result Value Ref Range Status   MRSA by PCR Next Gen NOT DETECTED NOT DETECTED Final    Comment: (NOTE) The GeneXpert MRSA Assay (FDA approved for NASAL specimens only), is one component of a comprehensive MRSA colonization surveillance program. It is not intended to diagnose MRSA infection nor to guide or  monitor treatment for MRSA infections. Test performance is not FDA approved in patients less than 35 years old. Performed at [facility], [address] [place], [place] [address]   Expectorated Sputum Assessment w Gram Stain, Rflx to Resp Cult     Status: None (Preliminary result)   Collection Time: 10/13/21  6:11 AM   Specimen: Expectorated Sputum  Result Value Ref Range Status   Specimen Description EXPECTORATED SPUTUM  Final   Special Requests NONE  Final   Sputum evaluation   Final    THIS SPECIMEN IS ACCEPTABLE FOR SPUTUM CULTURE Performed at [facility], [address] [place], [place] [address]    Report Status PENDING  Incomplete  Culture, Respiratory w Gram Stain     Status: None   Collection Time: 10/13/21  6:11 AM  Result Value Ref Range Status   Specimen Description EXPECTORATED SPUTUM  Final   Special Requests NONE Reflexed from [id number]  Final   Gram Stain   Final    FEW SQUAMOUS EPITHELIAL  CELLS PRESENT RARE WBC PRESENT, PREDOMINANTLY MONONUCLEAR MODERATE GRAM NEGATIVE RODS FEW GRAM POSITIVE COCCI IN PAIRS    Culture   Final    FEW Normal respiratory flora-no Staph aureus or Pseudomonas seen Performed at [facility], [address], [place], [place] [address]    Report Status 10/16/2021 FINAL  Final  Culture, blood (Routine X 2) w Reflex to ID Panel     Status: None (Preliminary result)   Collection Time: 10/14/21  1:43 PM   Specimen: BLOOD LEFT ARM  Result Value Ref Range Status   Specimen Description BLOOD LEFT ARM  Final   Special Requests   Final    BOTTLES DRAWN AEROBIC AND ANAEROBIC Blood Culture results may not be optimal due to an inadequate volume of blood received in culture bottles   Culture   Final    NO GROWTH 2 DAYS Performed at [facility], [address] [place], [place] [address]    Report Status PENDING  Incomplete  Culture, blood (Routine X 2) w Reflex to ID Panel     Status: None (Preliminary result)    Collection Time: 10/14/21  1:44 PM   Specimen: BLOOD LEFT HAND  Result Value Ref Range Status   Specimen Description BLOOD LEFT HAND  Final   Special Requests   Final    BOTTLES DRAWN AEROBIC ONLY Blood Culture results may not be optimal due to an inadequate volume of blood received in culture bottles   Culture   Final    NO GROWTH 2 DAYS Performed at [facility], [address], [place], [place] [address]    Report Status PENDING  Incomplete      Radiology Studies: No results found.    LOS: 6 days    Jacquelin Hawking, MD [facility] 10/17/2021, 9:57 AM   If 7PM-7AM, please contact night-coverage [email or web address]

## 2021-10-18 ENCOUNTER — Inpatient Hospital Stay: Payer: Self-pay

## 2021-10-18 DIAGNOSIS — I2602 Saddle embolus of pulmonary artery with acute cor pulmonale: Secondary | ICD-10-CM | POA: Diagnosis not present

## 2021-10-18 LAB — CBC
HCT: 28.8 % — ABNORMAL LOW (ref 36.0–46.0)
Hemoglobin: 8.5 g/dL — ABNORMAL LOW (ref 12.0–15.0)
MCH: 23.2 pg — ABNORMAL LOW (ref 26.0–34.0)
MCHC: 29.5 g/dL — ABNORMAL LOW (ref 30.0–36.0)
MCV: 78.5 fL — ABNORMAL LOW (ref 80.0–100.0)
Platelets: 368 10*3/uL (ref 150–400)
RBC: 3.67 MIL/uL — ABNORMAL LOW (ref 3.87–5.11)
RDW: 21.6 % — ABNORMAL HIGH (ref 11.5–15.5)
WBC: 18.6 10*3/uL — ABNORMAL HIGH (ref 4.0–10.5)
nRBC: 0.5 % — ABNORMAL HIGH (ref 0.0–0.2)

## 2021-10-18 LAB — BASIC METABOLIC PANEL
Anion gap: 6 (ref 5–15)
BUN: 6 mg/dL (ref 6–20)
CO2: 23 mmol/L (ref 22–32)
Calcium: 7.7 mg/dL — ABNORMAL LOW (ref 8.9–10.3)
Chloride: 105 mmol/L (ref 98–111)
Creatinine, Ser: 0.6 mg/dL (ref 0.44–1.00)
GFR, Estimated: >60 mL/min (ref >60)
Glucose, Bld: 121 mg/dL — ABNORMAL HIGH (ref 70–99)
Potassium: 3.5 mmol/L (ref 3.5–5.1)
Sodium: 134 mmol/L — ABNORMAL LOW (ref 135–145)

## 2021-10-18 MED ORDER — MORPHINE SULFATE (PF) 2 MG/ML IV SOLN
2.0000 mg | Freq: Three times a day (TID) | INTRAVENOUS | Status: DC | PRN
  Administered 2021-10-19 – 2021-10-25 (×13): 2 mg via INTRAVENOUS
  Filled 2021-10-18 (×13): qty 1

## 2021-10-18 MED ORDER — LINEZOLID 600 MG PO TABS
600.0000 mg | ORAL_TABLET | Freq: Two times a day (BID) | ORAL | Status: DC
Start: 2021-10-18 — End: 2021-10-19
  Administered 2021-10-18 – 2021-10-19 (×2): 600 mg via ORAL
  Filled 2021-10-18 (×3): qty 1

## 2021-10-18 MED ORDER — FLUCONAZOLE 200 MG PO TABS
800.0000 mg | ORAL_TABLET | Freq: Once | ORAL | Status: AC
Start: 2021-10-18 — End: 2021-10-18
  Administered 2021-10-18: 800 mg via ORAL
  Filled 2021-10-18: qty 4

## 2021-10-18 NOTE — Hospital Course (Addendum)
Latoya [name] is Latoya [name] 36 y.o. female with Latoya [name] history of MSSA tricuspid valve endocarditis, MRSA L4-L5 facet  join septic discitis in 07/2019 and more recently diagnosis of MRSA bacteremia with infective endocarditis and tricuspid valve vegetation diagnosed 07/2021.  Patient presented from [facility] for saddle pulmonary embolism.  Blood cultures growing candida albicans.  MRI L spine with findings concerning for septic arthritis.  ID was consulted.  Currently on vancomycin and micafungin.  Cardiothoracic surgery considering PFO closure/angio Vac for removal of TV vegetation.  Awaiting Ct surgery recs.

## 2021-10-18 NOTE — Progress Notes (Addendum)
Brief ID Note:   Patient's PIV access sites are not reliable / durable. VAT reached out to see if appropriate for PICC Line.  Last set of BCx are now negative 4 days in. We can go ahead and place PICC to maintain more durable access for continuing the IV antibiotics and antifungals.    Rexene Alberts, MSN, NP-C [facility] for Infectious Disease [facility]  [email or web address] Pager: [phone] Office: [phone] [facility] Main Line: [phone] *Secure Chat Communication Welcome    ADDENDUM: 3:56 PM  PICC team unable to place PICC line successfully. Will place order for IR consult to place.  Can start Linezolid 600 mg BID po until line placed then bridge back to Vancomycin via IV.  Will give her 1 dose of fluconazole PO high dose also to bridge back to micafungin to continue treating candidemia.   D/W Dr. Drue Second.   Rexene Alberts, MSN, NP-C [facility] for Infectious Disease [facility]  [email or web address] Pager: [phone] Office: [phone] [facility] Main Line: [phone] *Secure Chat Communication Welcome

## 2021-10-18 NOTE — Progress Notes (Signed)
PROGRESS NOTE    Latoya [name]  ZOX:[id number] DOB: [birth year] DOA: 10/11/2021 PCP: Pcp, No  No chief complaint on file.   Brief Narrative:  Latoya DecampHeather [name] is Latoya [name] 36 y.o. female with Latoya [name] history of MSSA tricuspid valve endocarditis, MRSA L4-L5 facet  join septic discitis in 07/2019 and more recently diagnosis of MRSA bacteremia with infective endocarditis and tricuspid valve vegetation diagnosed 07/2021. [facility] for saddle pulmonary embolism. Blood cultures growing candida albicans. Patient started on micafungin. PICC placement deferred and is pending negative repeat blood cultures. Cardiothoracic surgery considering PFO closure/angio Vac for removal of TV vegetation.      Assessment & Plan:   Principal Problem:   Acute saddle pulmonary embolism (HCC) Active Problems:   Candidemia (HCC)   Endocarditis of tricuspid valve   Lumbar discitis   Pneumonia of left lower lobe due to infectious organism   MRSA bacteremia   IV drug abuse (HCC)   Hyponatremia   Thrombocytopenia (HCC)   Pleuritic chest pain   Assessment and Plan: * Acute saddle pulmonary embolism (HCC) Noted on CTA chest. Acute on chronic diagnosis while on Eliquis PO. Started on Heparin IV. RV heart strain noted on imaging. Transthoracic Echocardiogram obtained which showed stable RV dysfunction from prior. Complicated by TV endocarditis with history of septic emboli. PCCM consulted with recommendations for continued anticoagulation and no thrombectomy. On room air. Hematology recommending Lovenox for treatment. Heparin not therapeutic. No procedures anticipated in the near future. Transitioned to Lovenox on 6/10. -Continue Lovenox -unchanged chest discomfort since admission related to VTE, follow (additional w/u as indicated) -telemetry monitoring  Candidemia (HCC) New diagnosis with blood culture (6/7) resulting yeast. Patient started on micafungin IV. Leukocytosis fluctuating, follow. -Continue  micafungin IV (fluconazole given while awaiting IV access) -Follow-up repeat blood cultures (6/10) NGx4 -PICC ordered, awaiting placement by IR  Endocarditis of tricuspid valve Previously diagnosed. Initial plan for angio vac which was canceled secondary to PFO. Patient was treated for this as an inpatient and discharged on linezolid. Vancomycin and Cefepime restarted on admission. ID consulted. 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  In addition, MRI lumbar spine with evidence of discitis. -ID recommendations: continue vancomycin and micafungin (fluconazole/linezolid while awaiting access).  She's completed cefepime for possible pneumonia. -TCTS consulted on 6/8 for recommendations on persistent large TV vegetation. Plan to consider occlusion of her PFO for consideration of angio VAC debridement (Dr. Cliffton [name] currently out of town, will follow up - see 6/9 note)  Lumbar discitis Noted on imaging. ID consulted. Neurosurgery curbsided. Recommendations for IR aspiration/biopsy. IR unable to perform secondary to no target for aspiration/biopsy. In setting of persistent fevers concerning for persistent bacteremia. -See problem, Endocarditis of tricuspid valve  Pneumonia of left lower lobe due to infectious organism Patient with Latoya [name] history of septic emboli on prior admission. Patient noted to have evidence of LLL pneumonia. Started on antibiotics as mentioned in problem, Endocarditis of tricuspid valve. Cefepime completed per ID  MRSA bacteremia -See problem, Endocarditis of tricuspid valve  IV drug abuse (HCC) History of. Patient reports continued abstinence since March of 2023. Patient reports not requiring resources for continued abstinence.  Thrombocytopenia (HCC) Mild. In setting of infection. Stable.  Hyponatremia Mild. Possibly in setting of acute illness/pain. Back to wnl.  Pleuritic chest pain Secondary to PE and persistent  coughing. -continue Tussionex PRN -limit IV meds -Continue tessalon perles       DVT prophylaxis: lovenox Code Status: full Family Communication: none  Disposition:   Status is: Inpatient Remains inpatient appropriate because: need for continued IV abx, anticoagulation   Consultants:  CT surgery PCCM Id heme  Procedures:  none  Antimicrobials:  Anti-infectives (From admission, onward)    Start     Dose/Rate Route Frequency Ordered Stop   10/18/21 2200  linezolid (ZYVOX) tablet 600 mg        600 mg Oral Every 12 hours 10/18/21 1600     10/18/21 1700  fluconazole (DIFLUCAN) tablet 800 mg        800 mg Oral  Once 10/18/21 1600 10/18/21 1657   10/17/21 2200  vancomycin (VANCOREADY) IVPB 1250 mg/250 mL  Status:  Discontinued        1,250 mg 166.7 mL/hr over 90 Minutes Intravenous Every 12 hours 10/17/21 1144 10/18/21 1600   10/17/21 1600  micafungin (MYCAMINE) 150 mg in sodium chloride 0.9 % 100 mL IVPB       See Hyperspace for full Linked Orders Report.   150 mg 115 mL/hr over 1 Hours Intravenous Every 24 hours 10/14/21 1115     10/14/21 1600  micafungin (MYCAMINE) 150 mg in sodium chloride 0.9 % 100 mL IVPB       See Hyperspace for full Linked Orders Report.   150 mg 107.5 mL/hr over 1 Hours Intravenous Every 24 hours 10/14/21 1115 10/16/21 1746   10/13/21 2200  vancomycin (VANCOREADY) IVPB 1500 mg/300 mL  Status:  Discontinued        1,500 mg 150 mL/hr over 120 Minutes Intravenous Every 12 hours 10/13/21 1321 10/17/21 1144   10/12/21 1700  micafungin (MYCAMINE) 150 mg in sodium chloride 0.9 % 100 mL IVPB  Status:  Discontinued        150 mg 115 mL/hr over 1 Hours Intravenous Every 24 hours 10/12/21 1601 10/14/21 1115   10/11/21 2200  vancomycin (VANCOREADY) IVPB 1250 mg/250 mL  Status:  Discontinued        1,250 mg 166.7 mL/hr over 90 Minutes Intravenous Every 12 hours 10/11/21 0903 10/13/21 1321   10/11/21 1000  linezolid (ZYVOX) tablet 600 mg  Status:   Discontinued        600 mg Oral 2 times daily 10/11/21 0149 10/11/21 0813   10/11/21 1000  ceFEPIme (MAXIPIME) 2 g in sodium chloride 0.9 % 100 mL IVPB  Status:  Discontinued        2 g 200 mL/hr over 30 Minutes Intravenous Every 8 hours 10/11/21 0901 10/16/21 0938   10/11/21 1000  vancomycin (VANCOREADY) IVPB 1750 mg/350 mL        1,750 mg 175 mL/hr over 120 Minutes Intravenous  Once 10/11/21 0901 10/11/21 1242       Subjective: C/o pleuritic CP, unchanged  Objective: Vitals:   10/18/21 0017 10/18/21 0520 10/18/21 0803 10/18/21 1309  BP: 117/78 130/87 128/89 121/87  Pulse: 96 97 99 (!) 102  Resp: 18 (!) 21 (!) 23   Temp: 98.5 F (36.9 C) 98.2 F (36.8 C) (!) 100.4 F (38 C)   TempSrc: Oral Oral Oral   SpO2: 96% 94% 97% 99%  Weight:      Height:        Intake/Output Summary (Last 24 hours) at 10/18/2021 1912 Last data filed at 10/18/2021 0300 Gross per 24 hour  Intake 826.42 ml  Output --  Net 826.42 ml   Filed Weights   10/11/21 0023  Weight: 82.6 kg    Examination:  General exam: Appears uncomfortable with cough and c/o chest  pain Respiratory system: unlabored Cardiovascular system: RRR Gastrointestinal system: Abdomen is nondistended, soft and nontender Central nervous system: Alert and oriented. No focal neurological deficits. Extremities: no LEE   Data Reviewed: I have personally reviewed following labs and imaging studies  CBC: Recent Labs  Lab 10/14/21 0115 10/15/21 0020 10/16/21 0650 10/17/21 0149 10/18/21 0052  WBC 11.7* 13.6* 14.9* 16.0* 18.6*  HGB 8.3* 8.1* 8.6* 9.5* 8.5*  HCT 27.7* 26.8* 28.8* 32.8* 28.8*  MCV 78.7* 77.5* 77.8* 79.8* 78.5*  PLT 145* 175 251 334 368    Basic Metabolic Panel: Recent Labs  Lab 10/14/21 0115 10/15/21 0020 10/16/21 0650 10/17/21 0149 10/18/21 0052  NA 135 131* 134* 137 134*  K 3.4* 4.6 5.1 4.1 3.5  CL 107 102 106 107 105  CO2 22 20* 15* 20* 23  GLUCOSE 172* 97 99 116* 121*  BUN CREATININE 0.62 0.68 0.56 0.66 0.60  CALCIUM 7.8* 7.8* 7.9* 8.4* 7.7*    GFR: Estimated Creatinine Clearance: 107.3 mL/min (by C-G formula based on SCr of 0.6 mg/dL).  Liver Function Tests: No results for input(s): "AST", "ALT", "ALKPHOS", "BILITOT", "PROT", "ALBUMIN" in the last 168 hours.  CBG: No results for input(s): "GLUCAP" in the last 168 hours.   Recent Results (from the past 240 hour(s))  Culture, blood (Routine X 2) w Reflex to ID Panel     Status: Abnormal (Preliminary result)   Collection Time: 10/11/21  2:57 AM   Specimen: BLOOD LEFT HAND  Result Value Ref Range Status   Specimen Description BLOOD LEFT HAND  Final   Special Requests   Final    AEROBIC BOTTLE ONLY Blood Culture results may not be optimal due to an excessive volume of blood received in culture bottles   Culture  Setup Time (Eppie Barhorst)  Final    YEAST AEROBIC BOTTLE ONLY CRITICAL RESULT CALLED TO, READ BACK BY AND VERIFIED WITH: PHARMD CARLA JARDIN ON 10/12/21 @ 1723 BY DRT    Culture ([place] Schnitker)  Final    CANDIDA ALBICANS Sent to [facility] for further susceptibility testing. Performed at [facility], [address], [place], [place] [address]    Report Status PENDING  Incomplete  Culture, blood (Routine X 2) w Reflex to ID Panel     Status: Abnormal (Preliminary result)   Collection Time: 10/11/21  2:57 AM   Specimen: BLOOD LEFT HAND  Result Value Ref Range Status   Specimen Description BLOOD LEFT HAND  Final   Special Requests   Final    AEROBIC BOTTLE ONLY Blood Culture results may not be optimal due to an excessive volume of blood received in culture bottles   Culture  Setup Time   Final    YEAST WITH PSEUDOHYPHAE AEROBIC BOTTLE ONLY CRITICAL RESULT CALLED TO, READ BACK BY AND VERIFIED WITH: PHARMD CARLA JARDIN ON 10/12/21 @ 1837 BY DRT Performed at [facility], [address], [place], [place] [address]    Culture CANDIDA ALBICANS (Derk Doubek)  Final   Report Status PENDING  Incomplete  Blood Culture  ID Panel (Reflexed)     Status: Abnormal   Collection Time: 10/11/21  2:57 AM  Result Value Ref Range Status   Enterococcus faecalis NOT DETECTED NOT DETECTED Final   Enterococcus Faecium NOT DETECTED NOT DETECTED Final   Listeria monocytogenes NOT DETECTED NOT DETECTED Final   Staphylococcus species NOT DETECTED NOT DETECTED Final   Staphylococcus aureus (BCID) NOT DETECTED NOT DETECTED Final  Staphylococcus epidermidis NOT DETECTED NOT DETECTED Final   Staphylococcus lugdunensis NOT DETECTED NOT DETECTED Final   Streptococcus species NOT DETECTED NOT DETECTED Final   Streptococcus agalactiae NOT DETECTED NOT DETECTED Final   Streptococcus pneumoniae NOT DETECTED NOT DETECTED Final   Streptococcus pyogenes NOT DETECTED NOT DETECTED Final   [employer].calcoaceticus-baumannii NOT DETECTED NOT DETECTED Final   Bacteroides fragilis NOT DETECTED NOT DETECTED Final   Enterobacterales NOT DETECTED NOT DETECTED Final   Enterobacter cloacae complex NOT DETECTED NOT DETECTED Final   Escherichia coli NOT DETECTED NOT DETECTED Final   Klebsiella aerogenes NOT DETECTED NOT DETECTED Final   Klebsiella oxytoca NOT DETECTED NOT DETECTED Final   Klebsiella pneumoniae NOT DETECTED NOT DETECTED Final   Proteus species NOT DETECTED NOT DETECTED Final   Salmonella species NOT DETECTED NOT DETECTED Final   Serratia marcescens NOT DETECTED NOT DETECTED Final   Haemophilus influenzae NOT DETECTED NOT DETECTED Final   Neisseria meningitidis NOT DETECTED NOT DETECTED Final   Pseudomonas aeruginosa NOT DETECTED NOT DETECTED Final   Stenotrophomonas maltophilia NOT DETECTED NOT DETECTED Final   Candida albicans DETECTED (Kameelah Minish) NOT DETECTED Final    Comment: CRITICAL RESULT CALLED TO, READ BACK BY AND VERIFIED WITH: PHARMD CARLA JARDIN ON 10/12/21 @ 1723 BY DRT    Candida auris NOT DETECTED NOT DETECTED Final   Candida glabrata NOT DETECTED NOT DETECTED Final   Candida krusei NOT DETECTED NOT DETECTED Final   Candida  parapsilosis NOT DETECTED NOT DETECTED Final   Candida tropicalis NOT DETECTED NOT DETECTED Final   Cryptococcus neoformans/gattii NOT DETECTED NOT DETECTED Final    Comment: Performed at [facility], [address] [place], [place] [address]  Antifungal AST 9 Drug Panel     Status: None (Preliminary result)   Collection Time: 10/11/21  2:57 AM  Result Value Ref Range Status   Organism ID, Yeast Preliminary report  Final    Comment: (NOTE) Specimen has been received and testing has been initiated. Performed At: [facility] [address] [place], [place] [id number] Jolene Schimke MD MV:[id number]    Amphotericin B MIC PENDING  Incomplete   Anidulafungin MIC PENDING  Incomplete   Caspofungin MIC PENDING  Incomplete   Micafungin MIC PENDING  Incomplete   Posaconazole MIC PENDING  Incomplete   Fluconazole Islt MIC PENDING  Incomplete   Flucytosine MIC PENDING  Incomplete   Itraconazole MIC PENDING  Incomplete   Voriconazole MIC PENDING  Incomplete   Source [id number] BLD  C ALBICANS  Final    Comment: Performed at [facility], [address], [place], [place] [address]  MRSA Next Gen by PCR, Nasal     Status: None   Collection Time: 10/11/21  9:58 AM   Specimen: Nasal Mucosa; Nasal Swab  Result Value Ref Range Status   MRSA by PCR Next Gen NOT DETECTED NOT DETECTED Final    Comment: (NOTE) The GeneXpert MRSA Assay (FDA approved for NASAL specimens only), is one component of [facility] comprehensive MRSA colonization surveillance program. It is not intended to diagnose MRSA infection nor to guide or monitor treatment for MRSA infections. Test performance is not FDA approved in patients less than 47 years old. Performed at [facility], [address] [place], [place] [address]   Expectorated Sputum Assessment w Gram Stain, Rflx to Resp Cult     Status: None   Collection Time: 10/13/21  6:11 AM   Specimen: Expectorated Sputum  Result Value Ref Range  Status    Specimen Description EXPECTORATED SPUTUM  Final   Special Requests NONE  Final   Sputum evaluation   Final    THIS SPECIMEN IS ACCEPTABLE FOR SPUTUM CULTURE Performed at [facility], [address] [place], [place] [address]    Report Status 10/17/2021 FINAL  Final  Culture, Respiratory w Gram Stain     Status: None   Collection Time: 10/13/21  6:11 AM  Result Value Ref Range Status   Specimen Description EXPECTORATED SPUTUM  Final   Special Requests NONE Reflexed from [id number]  Final   Gram Stain   Final    FEW SQUAMOUS EPITHELIAL CELLS PRESENT RARE WBC PRESENT, PREDOMINANTLY MONONUCLEAR MODERATE GRAM NEGATIVE RODS FEW GRAM POSITIVE COCCI IN PAIRS    Culture   Final    FEW Normal respiratory flora-no Staph aureus or Pseudomonas seen Performed at [facility], [address] [place], [place] [address]    Report Status 10/16/2021 FINAL  Final  Culture, blood (Routine X 2) w Reflex to ID Panel     Status: None (Preliminary result)   Collection Time: 10/14/21  1:43 PM   Specimen: BLOOD LEFT ARM  Result Value Ref Range Status   Specimen Description BLOOD LEFT ARM  Final   Special Requests   Final    BOTTLES DRAWN AEROBIC AND ANAEROBIC Blood Culture results may not be optimal due to an inadequate volume of blood received in culture bottles   Culture   Final    NO GROWTH 4 DAYS Performed at [facility], [address], [place], [place] [address]    Report Status PENDING  Incomplete  Culture, blood (Routine X 2) w Reflex to ID Panel     Status: None (Preliminary result)   Collection Time: 10/14/21  1:44 PM   Specimen: BLOOD LEFT HAND  Result Value Ref Range Status   Specimen Description BLOOD LEFT HAND  Final   Special Requests   Final    BOTTLES DRAWN AEROBIC ONLY Blood Culture results may not be optimal due to an inadequate volume of blood received in culture bottles   Culture   Final    NO GROWTH 4 DAYS Performed at [facility], [address],  [place], [place] [address]    Report Status PENDING  Incomplete         Radiology Studies: [place] EKG SITE RITE  Result Date: 10/18/2021 If Site Rite image not attached, placement could not be confirmed due to current cardiac rhythm.       Scheduled Meds:  benzonatate  200 mg Oral TID   enoxaparin (LOVENOX) injection  80 mg Subcutaneous Q12H   ferrous gluconate  324 mg Oral Q breakfast   gabapentin  300 mg Oral TID   linezolid  600 mg Oral Q12H   pantoprazole  40 mg Oral Daily   pregabalin  75 mg Oral TID   Continuous Infusions:  sodium chloride Stopped (10/17/21 1525)   micafungin (MYCAMINE) 150 mg in sodium chloride 0.9 % 100 mL IVPB 115 mL/hr at 10/17/21 1535     LOS: 7 days    Time spent: over 30 min    Lacretia Nicks, MD [facility]   To contact the attending provider between 7A-7P or the covering provider during after hours 7P-7A, please log into the web site [email or web address] and access using universal [place] password for that web site. If you do not have the password, please call the hospital  operator.  10/18/2021, 7:12 PM

## 2021-10-18 NOTE — Progress Notes (Signed)
Pt has been unable to receive IV medications today, due to no access and waiting for PICC line placement.

## 2021-10-18 NOTE — Progress Notes (Signed)
Secure chat with Rexene Alberts, NP. Patient to have a PICC line ordered for prolonged antibiotic therapy.

## 2021-10-18 NOTE — Progress Notes (Signed)
Pharmacy advised Vancomycin administration delayed due to loss of IV access.

## 2021-10-18 NOTE — Progress Notes (Signed)
Order received for PICC placement. IVT unable to place PICC on previous visit. Sent to IR for placement. Secure chat sent to Rexene Alberts, NP who stated she will send a patient referral to IR for PICC placement.

## 2021-10-19 ENCOUNTER — Inpatient Hospital Stay (HOSPITAL_COMMUNITY): Payer: Medicaid Other

## 2021-10-19 DIAGNOSIS — Z792 Long term (current) use of antibiotics: Secondary | ICD-10-CM | POA: Diagnosis not present

## 2021-10-19 DIAGNOSIS — I2602 Saddle embolus of pulmonary artery with acute cor pulmonale: Secondary | ICD-10-CM | POA: Diagnosis not present

## 2021-10-19 HISTORY — PX: IR VENO/EXT/UNI RIGHT: IMG676

## 2021-10-19 HISTORY — PX: IR US GUIDE VASC ACCESS RIGHT: IMG2390

## 2021-10-19 LAB — COMPREHENSIVE METABOLIC PANEL
ALT: 7 U/L (ref 0–44)
AST: 13 U/L — ABNORMAL LOW (ref 15–41)
Albumin: 2.3 g/dL — ABNORMAL LOW (ref 3.5–5.0)
Alkaline Phosphatase: 62 U/L (ref 38–126)
Anion gap: 12 (ref 5–15)
BUN: <5 mg/dL — ABNORMAL LOW (ref 6–20)
CO2: 22 mmol/L (ref 22–32)
Calcium: 8.2 mg/dL — ABNORMAL LOW (ref 8.9–10.3)
Chloride: 101 mmol/L (ref 98–111)
Creatinine, Ser: 0.56 mg/dL (ref 0.44–1.00)
GFR, Estimated: >60 mL/min (ref >60)
Glucose, Bld: 112 mg/dL — ABNORMAL HIGH (ref 70–99)
Potassium: 3.4 mmol/L — ABNORMAL LOW (ref 3.5–5.1)
Sodium: 135 mmol/L (ref 135–145)
Total Bilirubin: 0.6 mg/dL (ref 0.3–1.2)
Total Protein: 7.6 g/dL (ref 6.5–8.1)

## 2021-10-19 LAB — CBC WITH DIFFERENTIAL/PLATELET
Abs Immature Granulocytes: 0.25 10*3/uL — ABNORMAL HIGH (ref 0.00–0.07)
Basophils Absolute: 0 10*3/uL (ref 0.0–0.1)
Basophils Relative: 0 %
Eosinophils Absolute: 0.3 10*3/uL (ref 0.0–0.5)
Eosinophils Relative: 2 %
HCT: 29 % — ABNORMAL LOW (ref 36.0–46.0)
Hemoglobin: 8.6 g/dL — ABNORMAL LOW (ref 12.0–15.0)
Immature Granulocytes: 2 %
Lymphocytes Relative: 18 %
Lymphs Abs: 2.8 10*3/uL (ref 0.7–4.0)
MCH: 23.4 pg — ABNORMAL LOW (ref 26.0–34.0)
MCHC: 29.7 g/dL — ABNORMAL LOW (ref 30.0–36.0)
MCV: 78.8 fL — ABNORMAL LOW (ref 80.0–100.0)
Monocytes Absolute: 1 10*3/uL (ref 0.1–1.0)
Monocytes Relative: 6 %
Neutro Abs: 11.3 10*3/uL — ABNORMAL HIGH (ref 1.7–7.7)
Neutrophils Relative %: 72 %
Platelets: 384 10*3/uL (ref 150–400)
RBC: 3.68 MIL/uL — ABNORMAL LOW (ref 3.87–5.11)
RDW: 21.7 % — ABNORMAL HIGH (ref 11.5–15.5)
WBC: 15.7 10*3/uL — ABNORMAL HIGH (ref 4.0–10.5)
nRBC: 0.4 % — ABNORMAL HIGH (ref 0.0–0.2)

## 2021-10-19 LAB — CULTURE, BLOOD (ROUTINE X 2)
Culture: NO GROWTH
Culture: NO GROWTH

## 2021-10-19 LAB — ANTIFUNGAL AST 9 DRUG PANEL
Amphotericin B MIC: 0.5
Flucytosine MIC: 0.12
Itraconazole MIC: 0.06
Posaconazole MIC: 0.03
Source: 183119

## 2021-10-19 LAB — MAGNESIUM: Magnesium: 1.9 mg/dL (ref 1.7–2.4)

## 2021-10-19 LAB — PHOSPHORUS: Phosphorus: 3.4 mg/dL (ref 2.5–4.6)

## 2021-10-19 MED ORDER — LIDOCAINE HCL 1 % IJ SOLN
INTRAMUSCULAR | Status: AC
  Filled 2021-10-19: qty 20

## 2021-10-19 MED ORDER — CHLORHEXIDINE GLUCONATE CLOTH 2 % EX PADS
6.0000 | MEDICATED_PAD | Freq: Every day | CUTANEOUS | Status: DC
Start: 2021-10-20 — End: 2021-10-23
  Administered 2021-10-20 – 2021-10-22 (×3): 6 via TOPICAL

## 2021-10-19 MED ORDER — IOHEXOL 300 MG/ML  SOLN
100.0000 mL | Freq: Once | INTRAMUSCULAR | Status: AC | PRN
  Administered 2021-10-19: 20 mL via INTRAVENOUS

## 2021-10-19 MED ORDER — LIDOCAINE HCL 1 % IJ SOLN
INTRAMUSCULAR | Status: DC | PRN
  Administered 2021-10-19: 7 mL via INTRADERMAL

## 2021-10-19 MED ORDER — HYDROCOD POLI-CHLORPHE POLI ER 10-8 MG/5ML PO SUER
5.0000 mL | Freq: Two times a day (BID) | ORAL | Status: DC | PRN
  Administered 2021-10-19 – 2021-11-18 (×34): 5 mL via ORAL
  Filled 2021-10-19 (×37): qty 5

## 2021-10-19 MED ORDER — VANCOMYCIN HCL 1250 MG/250ML IV SOLN
1250.0000 mg | Freq: Two times a day (BID) | INTRAVENOUS | Status: DC
  Administered 2021-10-19 – 2021-10-23 (×9): 1250 mg via INTRAVENOUS
  Filled 2021-10-19 (×10): qty 250

## 2021-10-19 NOTE — Progress Notes (Signed)
Pharmacy Antibiotic Note  Latoya [name] is a 36 y.o. female admitted on 10/11/2021 with CP and back pain. Pt noted to have acute PE. Pt has significant history of MSSA and MRSA bacteremia c/b endocarditis and lumbar discitis. Pt recently completed 6 weeks of vancomycin and was started on linezolid after. Pharmacy has been consulted for vancomycin. Micafungin ordered as well with C. Albicans growing in blood. ID following.   The IV line was lost and she was placed on po Zyvox. Plans are to resume vancomycin -WBC= 15.7 -s/p PICC by IR  Plan: -Contine vancomycin to 1250mg  IV q12h -Will follow renal function and clinical progress   Height: 5\' 6"  (167.6 cm) Weight: 82.6 kg (182 lb 3.2 oz) IBW/kg (Calculated) : 59.3  Temp (24hrs), Avg:98.6 F (37 C), Min:97.9 F (36.6 C), Max:100 F (37.8 C)  Recent Labs  Lab 10/13/21 0553 10/13/21 1211 10/14/21 0115 10/15/21 0020 10/16/21 0650 10/17/21 0149 10/17/21 0904 10/18/21 0052 10/19/21 0202  WBC 17.4*  --    < > 13.6* 14.9* 16.0*  --  18.6* 15.7*  CREATININE 0.70  --    < > 0.68 0.56 0.66  --  0.60 0.56  VANCOTROUGH  --  11*  --   --   --   --  14*  --   --   VANCOPEAK 16*  --   --   --   --  40  --   --   --    < > = values in this interval not displayed.     Estimated Creatinine Clearance: 107.3 mL/min (by C-G formula based on SCr of 0.56 mg/dL).    Allergies  Allergen Reactions   Naltrexone Anxiety    Severe anxiety Restlessness  Vomiting     Antimicrobials this admission: Vancomycin 6/7 >>  Cefepime 6/7 >>  Micafungin 6/8 >>  Microbiology results: 6/7 BCx: C. Albicans 6/7 MRSA PCR: negative 6/9 resp- normal flora 6/10 blood x2- neg  Thank you for allowing pharmacy to be a part of this patient's care.  8/7, PharmD Clinical Pharmacist **Pharmacist phone directory can now be found on [email or web address] ([email or web address]).  Listed under [facility].  e

## 2021-10-19 NOTE — Procedures (Signed)
PROCEDURE SUMMARY:  Successful placement of dual lumen PICC line to left brachial vein. Length 35cm Tip at lower SVC/RA No complications PICC capped Ready for use. EBL = trace  *Procedure was difficult secondary to the patient having small veins and vasospasm upon access. Recommend future PICCs to be placed using the left arm and only in IR.  Please see full dictation in Imaging section for details.   Shondell Poulson S Dajanique Robley PA-C 10/19/2021 1:04 PM

## 2021-10-19 NOTE — Progress Notes (Signed)
PROGRESS NOTE    Latoya [name]  TKW:[id number] DOB: 1986-04-24 DOA: 10/11/2021 PCP: Pcp, No  No chief complaint on file.   Brief Narrative:  Latoya [name] is Latoya [name] 36 y.o. female with Latoya [name] history of MSSA tricuspid valve endocarditis, MRSA L4-L5 facet  join septic discitis in 07/2019 and more recently diagnosis of MRSA bacteremia with infective endocarditis and tricuspid valve vegetation diagnosed 07/2021. Patient presented from [facility] for saddle pulmonary embolism. Blood cultures growing candida albicans. Patient started on micafungin. PICC placement deferred and is pending negative repeat blood cultures. Cardiothoracic surgery considering PFO closure/angio Vac for removal of TV vegetation.      Assessment & Plan:   Principal Problem:   Acute saddle pulmonary embolism (HCC) Active Problems:   Candidemia ([place])   Endocarditis of tricuspid valve   Lumbar discitis   Pneumonia of left lower lobe due to infectious organism   MRSA bacteremia   IV drug abuse ([place])   Hyponatremia   Thrombocytopenia (HCC)   Pleuritic chest pain   Assessment and Plan: * Acute saddle pulmonary embolism (HCC) Noted on CTA chest. Acute on chronic diagnosis while on Eliquis PO. Started on Heparin IV. RV heart strain noted on imaging. Transthoracic Echocardiogram obtained which showed stable RV dysfunction from prior. Complicated by TV endocarditis with history of septic emboli. PCCM consulted with recommendations for continued anticoagulation and no thrombectomy. On room air. Hematology recommending Lovenox for treatment. Heparin not therapeutic. No procedures anticipated in the near future. Transitioned to Lovenox on 6/10. -Continue Lovenox (refused last 3 doses, discussed importance of continuing lovenox) -unchanged chest discomfort since admission related to VTE, follow (additional w/u as indicated) -telemetry monitoring  Candidemia ([place]) New diagnosis with blood culture (6/7) resulting yeast. Patient  started on micafungin IV. Leukocytosis fluctuating, follow. -Continue micafungin IV -Follow-up repeat blood cultures (6/10) NGx5 -PICC placed by IR 6/15  Endocarditis of tricuspid valve Previously diagnosed. Initial plan for angio vac which was canceled secondary to PFO. Patient was treated for this as an inpatient and discharged on linezolid. Vancomycin and Cefepime restarted on admission. ID consulted. 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  In addition, MRI lumbar spine with evidence of discitis. - fever 6/14, follow -ID recommendations: continue vancomycin and micafungin (fluconazole/linezolid while awaiting access).  She's completed cefepime for possible pneumonia. -TCTS consulted on 6/8 for recommendations on persistent large TV vegetation. Plan to consider occlusion of her PFO for consideration of angio VAC debridement (Dr. Kipp Brood currently out of town, will follow up - see 6/9 note)  Lumbar discitis Noted on imaging. ID consulted. Neurosurgery curbsided. Recommendations for IR aspiration/biopsy. IR unable to perform secondary to no target for aspiration/biopsy. In setting of persistent fevers concerning for persistent bacteremia. -See problem, Endocarditis of tricuspid valve  Pneumonia of left lower lobe due to infectious organism Patient with Latoya [name] history of septic emboli on prior admission. Patient noted to have evidence of LLL pneumonia. Started on antibiotics as mentioned in problem, Endocarditis of tricuspid valve. Cefepime completed per ID  MRSA bacteremia -See problem, Endocarditis of tricuspid valve  IV drug abuse ([place]) History of. Patient reports continued abstinence since March of 2023. Patient reports not requiring resources for continued abstinence.  Thrombocytopenia (HCC) Mild. In setting of infection. Stable.  Hyponatremia Mild. Possibly in setting of acute illness/pain. Back to wnl.  Pleuritic chest  pain Secondary to PE and persistent coughing. -continue Tussionex PRN -limit IV meds -Continue tessalon perles       DVT prophylaxis: lovenox  Code Status: full Family Communication: father, children at bedside Disposition:   Status is: Inpatient Remains inpatient appropriate because: need for abx, ID, CT surgery c/s   Consultants:  ID CT surgery PCCM IR  Procedures:  Picc placement by IR  Antimicrobials:  Anti-infectives (From admission, onward)    Start     Dose/Rate Route Frequency Ordered Stop   10/19/21 2200  vancomycin (VANCOREADY) IVPB 1250 mg/250 mL        1,250 mg 166.7 mL/hr over 90 Minutes Intravenous Every 12 hours 10/19/21 1341     10/18/21 2200  linezolid (ZYVOX) tablet 600 mg  Status:  Discontinued        600 mg Oral Every 12 hours 10/18/21 1600 10/19/21 1341   10/18/21 1700  fluconazole (DIFLUCAN) tablet 800 mg        800 mg Oral  Once 10/18/21 1600 10/18/21 1657   10/17/21 2200  vancomycin (VANCOREADY) IVPB 1250 mg/250 mL  Status:  Discontinued        1,250 mg 166.7 mL/hr over 90 Minutes Intravenous Every 12 hours 10/17/21 1144 10/18/21 1600   10/17/21 1600  micafungin (MYCAMINE) 150 mg in sodium chloride 0.9 % 100 mL IVPB       See Hyperspace for full Linked Orders Report.   150 mg 107.5 mL/hr over 1 Hours Intravenous Every 24 hours 10/14/21 1115     10/14/21 1600  micafungin (MYCAMINE) 150 mg in sodium chloride 0.9 % 100 mL IVPB       See Hyperspace for full Linked Orders Report.   150 mg 107.5 mL/hr over 1 Hours Intravenous Every 24 hours 10/14/21 1115 10/16/21 1746   10/13/21 2200  vancomycin (VANCOREADY) IVPB 1500 mg/300 mL  Status:  Discontinued        1,500 mg 150 mL/hr over 120 Minutes Intravenous Every 12 hours 10/13/21 1321 10/17/21 1144   10/12/21 1700  micafungin (MYCAMINE) 150 mg in sodium chloride 0.9 % 100 mL IVPB  Status:  Discontinued        150 mg 115 mL/hr over 1 Hours Intravenous Every 24 hours 10/12/21 1601 10/14/21 1115    10/11/21 2200  vancomycin (VANCOREADY) IVPB 1250 mg/250 mL  Status:  Discontinued        1,250 mg 166.7 mL/hr over 90 Minutes Intravenous Every 12 hours 10/11/21 0903 10/13/21 1321   10/11/21 1000  linezolid (ZYVOX) tablet 600 mg  Status:  Discontinued        600 mg Oral 2 times daily 10/11/21 0149 10/11/21 0813   10/11/21 1000  ceFEPIme (MAXIPIME) 2 g in sodium chloride 0.9 % 100 mL IVPB  Status:  Discontinued        2 g 200 mL/hr over 30 Minutes Intravenous Every 8 hours 10/11/21 0901 10/16/21 0938   10/11/21 1000  vancomycin (VANCOREADY) IVPB 1750 mg/350 mL        1,750 mg 175 mL/hr over 120 Minutes Intravenous  Once 10/11/21 0901 10/11/21 1242       Subjective: Discussed importance of lovenox Asking for medicine for cough She c/o abdominal discomfort with lovenox - discussed importance of this med, esp in setting of eliquis failure  Objective: Vitals:   10/18/21 1920 10/19/21 0531 10/19/21 0925 10/19/21 0944  BP: (!) 130/95 106/76 116/81 124/83  Pulse:  99 100 99  Resp: 20 19  (!) 21  Temp: 100 F (37.8 C) 98.5 F (36.9 C) 97.9 F (36.6 C) 98.1 F (36.7 C)  TempSrc: Oral Oral Oral Oral  SpO2:  99%  94%  Weight:      Height:       No intake or output data in the 24 hours ending 10/19/21 1751 Filed Weights   10/11/21 0023  Weight: 82.6 kg    Examination:  General exam: Appears calm and comfortable  Respiratory system: unlabored Gastrointestinal system: Abdomen is nondistended, soft and nontender. Central nervous system: Alert and oriented. No focal neurological deficits. Extremities: no LEE   Data Reviewed: I have personally reviewed following labs and imaging studies  CBC: Recent Labs  Lab 10/15/21 0020 10/16/21 0650 10/17/21 0149 10/18/21 0052 10/19/21 0202  WBC 13.6* 14.9* 16.0* 18.6* 15.7*  NEUTROABS  --   --   --   --  11.3*  HGB 8.1* 8.6* 9.5* 8.5* 8.6*  HCT 26.8* 28.8* 32.8* 28.8* 29.0*  MCV 77.5* 77.8* 79.8* 78.5* 78.8*  PLT 175 251 334 368  943    Basic Metabolic Panel: Recent Labs  Lab 10/15/21 0020 10/16/21 0650 10/17/21 0149 10/18/21 0052 10/19/21 0202  NA 131* 134* 137 134* 135  K 4.6 5.1 4.1 3.5 3.4*  CL 102 106 107 105 101  CO2 20* 15* 20* 23 22  GLUCOSE 97 99 116* 121* 112*  BUN '8 8 7 6 ' <5*  CREATININE 0.68 0.56 0.66 0.60 0.56  CALCIUM 7.8* 7.9* 8.4* 7.7* 8.2*  MG  --   --   --   --  1.9  PHOS  --   --   --   --  3.4    GFR: Estimated Creatinine Clearance: 107.3 mL/min (by C-G formula based on SCr of 0.56 mg/dL).  Liver Function Tests: Recent Labs  Lab 10/19/21 0202  AST 13*  ALT 7  ALKPHOS 62  BILITOT 0.6  PROT 7.6  ALBUMIN 2.3*    CBG: No results for input(s): "GLUCAP" in the last 168 hours.   Recent Results (from the past 240 hour(s))  Culture, blood (Routine X 2) w Reflex to ID Panel     Status: Abnormal   Collection Time: 10/11/21  2:57 AM   Specimen: BLOOD LEFT HAND  Result Value Ref Range Status   Specimen Description BLOOD LEFT HAND  Final   Special Requests   Final    AEROBIC BOTTLE ONLY Blood Culture results may not be optimal due to an excessive volume of blood received in culture bottles   Culture  Setup Time (Latoya [name])  Final    YEAST AEROBIC BOTTLE ONLY CRITICAL RESULT CALLED TO, READ BACK BY AND VERIFIED WITH: PHARMD CARLA JARDIN ON 10/12/21 @ 1723 BY DRT    Culture (Latoya [name])  Final    CANDIDA ALBICANS SEE SEPARATE REPORT FOR [facility] SUSCEPTIBILITY RESULTS Performed at [facility], [place] [address], [place], [place] [address]    Report Status 10/19/2021 FINAL  Final  Culture, blood (Routine X 2) w Reflex to ID Panel     Status: Abnormal   Collection Time: 10/11/21  2:57 AM   Specimen: BLOOD LEFT HAND  Result Value Ref Range Status   Specimen Description BLOOD LEFT HAND  Final   Special Requests   Final    AEROBIC BOTTLE ONLY Blood Culture results may not be optimal due to an excessive volume of blood received in culture bottles   Culture  Setup Time   Final    YEAST WITH  PSEUDOHYPHAE AEROBIC BOTTLE ONLY CRITICAL RESULT CALLED TO, READ BACK BY AND VERIFIED WITH: PHARMD CARLA JARDIN ON 10/12/21 @ 1837 BY DRT  Culture CANDIDA ALBICANS (Latoya [name])  Final   Report Status 10/19/2021 FINAL  Final  Blood Culture ID Panel (Reflexed)     Status: Abnormal   Collection Time: 10/11/21  2:57 AM  Result Value Ref Range Status   Enterococcus faecalis NOT DETECTED NOT DETECTED Final   Enterococcus Faecium NOT DETECTED NOT DETECTED Final   Listeria monocytogenes NOT DETECTED NOT DETECTED Final   Staphylococcus species NOT DETECTED NOT DETECTED Final   Staphylococcus aureus (BCID) NOT DETECTED NOT DETECTED Final   Staphylococcus epidermidis NOT DETECTED NOT DETECTED Final   Staphylococcus lugdunensis NOT DETECTED NOT DETECTED Final   Streptococcus species NOT DETECTED NOT DETECTED Final   Streptococcus agalactiae NOT DETECTED NOT DETECTED Final   Streptococcus pneumoniae NOT DETECTED NOT DETECTED Final   Streptococcus pyogenes NOT DETECTED NOT DETECTED Final   Latoya [name].calcoaceticus-baumannii NOT DETECTED NOT DETECTED Final   Bacteroides fragilis NOT DETECTED NOT DETECTED Final   Enterobacterales NOT DETECTED NOT DETECTED Final   Enterobacter cloacae complex NOT DETECTED NOT DETECTED Final   Escherichia coli NOT DETECTED NOT DETECTED Final   Klebsiella aerogenes NOT DETECTED NOT DETECTED Final   Klebsiella oxytoca NOT DETECTED NOT DETECTED Final   Klebsiella pneumoniae NOT DETECTED NOT DETECTED Final   Proteus species NOT DETECTED NOT DETECTED Final   Salmonella species NOT DETECTED NOT DETECTED Final   Serratia marcescens NOT DETECTED NOT DETECTED Final   Haemophilus influenzae NOT DETECTED NOT DETECTED Final   Neisseria meningitidis NOT DETECTED NOT DETECTED Final   Pseudomonas aeruginosa NOT DETECTED NOT DETECTED Final   Stenotrophomonas maltophilia NOT DETECTED NOT DETECTED Final   Candida albicans DETECTED (Latoya [name]) NOT DETECTED Final    Comment: CRITICAL RESULT CALLED TO, READ  BACK BY AND VERIFIED WITH: PHARMD CARLA JARDIN ON 10/12/21 @ 1723 BY DRT    Candida auris NOT DETECTED NOT DETECTED Final   Candida glabrata NOT DETECTED NOT DETECTED Final   Candida krusei NOT DETECTED NOT DETECTED Final   Candida parapsilosis NOT DETECTED NOT DETECTED Final   Candida tropicalis NOT DETECTED NOT DETECTED Final   Cryptococcus neoformans/gattii NOT DETECTED NOT DETECTED Final    Comment: Performed at [facility], [address] [place],  [address]  Antifungal AST 9 Drug Panel     Status: None   Collection Time: 10/11/21  2:57 AM  Result Value Ref Range Status   Organism ID, Yeast Candida albicans  Corrected    Comment: (NOTE) Identification performed by account, not confirmed by this laboratory. CORRECTED ON 06/15 AT 1137: PREVIOUSLY REPORTED AS Preliminary report    Amphotericin B MIC 0.5 ug/mL  Final    Comment: (NOTE) Breakpoints have been established for only some organism-drug combinations as indicated. This test was developed and its performance characteristics determined by [facility]. It has not been cleared or approved by the Food and Drug Administration.    Anidulafungin MIC Comment  Final    Comment: (NOTE) 0.06 ug/mL Susceptible Breakpoints have been established for only some organism-drug combinations as indicated. This test was developed and its performance characteristics determined by [facility]. It has not been cleared or approved by the Food and Drug Administration.    Caspofungin MIC Comment  Final    Comment: (NOTE) 0.25 ug/mL Susceptible Breakpoints have been established for only some organism-drug combinations as indicated. This test was developed and its performance characteristics determined by [facility]. It has not been cleared or approved by the Food and Drug Administration.    Micafungin MIC Comment  Final  Comment: (NOTE) 0.016 ug/mL Susceptible Breakpoints have been established for only some  organism-drug combinations as indicated. This test was developed and its performance characteristics determined by [facility]. It has not been cleared or approved by the Food and Drug Administration.    Posaconazole MIC 0.03 ug/mL  Final    Comment: (NOTE) Breakpoints have been established for only some organism-drug combinations as indicated. This test was developed and its performance characteristics determined by [facility]. It has not been cleared or approved by the Food and Drug Administration.    Fluconazole Islt MIC Comment  Final    Comment: (NOTE) 0.25 ug/mL Susceptible Breakpoints have been established for only some organism-drug combinations as indicated. This test was developed and its performance characteristics determined by [facility]. It has not been cleared or approved by the Food and Drug Administration.    Flucytosine MIC 0.12 ug/mL  Final    Comment: (NOTE) Breakpoints have been established for only some organism-drug combinations as indicated. This test was developed and its performance characteristics determined by [facility]. It has not been cleared or approved by the Food and Drug Administration.    Itraconazole MIC 0.06 ug/mL  Final    Comment: (NOTE) Breakpoints have been established for only some organism-drug combinations as indicated. This test was developed and its performance characteristics determined by [facility]. It has not been cleared or approved by the Food and Drug Administration.    Voriconazole MIC Comment  Final    Comment: (NOTE) 0.008 ug/mL Susceptible Breakpoints have been established for only some organism-drug combinations as indicated. This test was developed and its performance characteristics determined by [facility]. It has not been cleared or approved by the Food and Drug Administration. Performed At: [facility] [place], [place] [id number] Rush Farmer MD [name]:[id number]    Source [phone] BLD  C ALBICANS   Final    Comment: Performed at [facility], [place] [address], [place], [place] [address]  MRSA Next Gen by PCR, Nasal     Status: None   Collection Time: 10/11/21  9:58 AM   Specimen: Nasal Mucosa; Nasal Swab  Result Value Ref Range Status   MRSA by PCR Next Gen NOT DETECTED NOT DETECTED Final    Comment: (NOTE) The GeneXpert MRSA Assay (FDA approved for NASAL specimens only), is one component of Latoya [name] comprehensive MRSA colonization surveillance program. It is not intended to diagnose MRSA infection nor to guide or monitor treatment for MRSA infections. Test performance is not FDA approved in patients less than 12 years old. Performed at [facility], [place] [address], [place], [place] [address]   Expectorated Sputum Assessment w Gram Stain, Rflx to Resp Cult     Status: None   Collection Time: 10/13/21  6:11 AM   Specimen: Expectorated Sputum  Result Value Ref Range Status   Specimen Description EXPECTORATED SPUTUM  Final   Special Requests NONE  Final   Sputum evaluation   Final    THIS SPECIMEN IS ACCEPTABLE FOR SPUTUM CULTURE Performed at [facility], [place] [address], [place], [place] [address]    Report Status 10/17/2021 FINAL  Final  Culture, Respiratory w Gram Stain     Status: None   Collection Time: 10/13/21  6:11 AM  Result Value Ref Range Status   Specimen Description EXPECTORATED SPUTUM  Final   Special Requests NONE Reflexed from [id number]  Final   Gram Stain   Final    FEW SQUAMOUS EPITHELIAL CELLS PRESENT RARE  WBC PRESENT, PREDOMINANTLY MONONUCLEAR MODERATE GRAM NEGATIVE RODS FEW GRAM POSITIVE COCCI IN PAIRS    Culture   Final    FEW Normal respiratory flora-no Staph aureus or Pseudomonas seen Performed at [facility], [address] [place], [place] [address]    Report Status 10/16/2021 FINAL  Final  Culture, blood (Routine X 2) w Reflex to ID Panel     Status: None   Collection Time: 10/14/21  1:43 PM   Specimen: BLOOD LEFT ARM  Result  Value Ref Range Status   Specimen Description BLOOD LEFT ARM  Final   Special Requests   Final    BOTTLES DRAWN AEROBIC AND ANAEROBIC Blood Culture results may not be optimal due to an inadequate volume of blood received in culture bottles   Culture   Final    NO GROWTH 5 DAYS Performed at [facility], [place] [address], [place], [place] [address]    Report Status 10/19/2021 FINAL  Final  Culture, blood (Routine X 2) w Reflex to ID Panel     Status: None   Collection Time: 10/14/21  1:44 PM   Specimen: BLOOD LEFT HAND  Result Value Ref Range Status   Specimen Description BLOOD LEFT HAND  Final   Special Requests   Final    BOTTLES DRAWN AEROBIC ONLY Blood Culture results may not be optimal due to an inadequate volume of blood received in culture bottles   Culture   Final    NO GROWTH 5 DAYS Performed at [facility], [place] [address] [place], [place] [address]    Report Status 10/19/2021 FINAL  Final         Radiology Studies: IR PICC PLACEMENT RIGHT >5 YRS INC IMG GUIDE  Result Date: 10/19/2021 INDICATION: Candida albicans bacteremia, larger tricuspid valve vegetation and concern on imaging for new septic arthritis at L4-5. For central venous access for long-term IV antibiotics. EXAM: ULTRASOUND AND FLUOROSCOPIC GUIDED PICC LINE INSERTION MEDICATIONS: 1% lidocaine 4 mL CONTRAST:  8 mL FLUOROSCOPY: Radiation Exposure Index (as provided by the fluoroscopic device): 6.1 mGy Kerma COMPLICATIONS: None immediate. TECHNIQUE: The procedure, risks, benefits, and alternatives were explained to the patient and informed written consent was obtained. The right upper extremity was prepped with chlorhexidine in Kashis Penley sterile fashion, and Kacey Vicuna sterile drape was applied covering the operative field. Maximum barrier sterile technique with sterile gowns and gloves were used for the procedure. Andron Marrazzo timeout was performed prior to the initiation of the procedure. Local anesthesia was provided with 1%  lidocaine. After the overlying soft tissues were anesthetized with 1% lidocaine, Chrysa Rampy micropuncture kit was utilized to access the right brachial vein. Real-time ultrasound guidance was utilized for vascular access including the acquisition of Gaila Engebretsen permanent ultrasound image documenting patency of the accessed vessel. Akiya Morr guidewire was advanced to the level of the superior caval-atrial junction for measurement purposes and the PICC line was cut to length. Xaria Judon peel-away sheath was placed and Devyn Griffing 35 cm, 5 [place], dual lumen was inserted however I could not advance the catheter past the axilla. Venogram was done via the sheath which revealed possible extravasation. Further attempts at placement of the PICC line on the right arm were aborted and the sheath was removed. Pressure was held and Meenakshi Sazama dressing was placed and I moved to the left arm. The left upper extremity was prepped with chlorhexidine in Jalysa Swopes sterile fashion, and Myrian Botello sterile drape was applied covering the operative field. Maximum barrier sterile technique with  sterile gowns and gloves were used for the procedure. Leyana Whidden timeout was performed prior to the initiation of the procedure. Local anesthesia was provided with 1% lidocaine. After the overlying soft tissues were anesthetized with 1% lidocaine, Kimmy Totten micropuncture kit was utilized to access the left brachial vein. Real-time ultrasound guidance was utilized for vascular access including the acquisition of Lucy Boardman permanent ultrasound image documenting patency of the accessed vessel. I attempted to advance the guidewire however was unsuccessful. Ultrasound of the accessed vein revealed vasospasm. Given the difficulty I was having placing the PICC line I requested Dr. Corliss Blacker assistance. He was able to successfully access the brachial vein. The guidewire was advanced to the level of the superior caval atrial junction. He placed the PICC line over the wire and was able to advance Letina Luckett 35 cm, 5 [place], dual lumen to level of the superior  caval-atrial junction. Fowler Antos post procedure spot fluoroscopic was obtained. The catheter easily aspirated and flushed and was secured in place. Sumiko Ceasar dressing was placed. The patient tolerated the procedure well without immediate post procedural complication. FINDINGS: After catheter placement, the tip lies within the superior cavoatrial junction. The catheter aspirates and flushes normally and is ready for immediate use. The procedure was difficult secondary to the patient having small veins and vasospasm. Recommend all future attempts at [facility] line placement be in the left arm and done in interventional radiology. IMPRESSION: Successful ultrasound and fluoroscopic guided placement of Nowell Sites left brachial vein approach, 35 cm, 5 [place], dual lumen PICC with tip at the superior caval-atrial junction. The PICC line is ready for immediate use. Procedure performed by: Gareth Eagle, PA-C and Dr. Albin Felling Electronically Signed   By: Albin Felling M.D.   On: 10/19/2021 14:30   IR Veno/Ext/Uni Right  Result Date: 10/19/2021 INDICATION: Candida albicans bacteremia, larger tricuspid valve vegetation and concern on imaging for new septic arthritis at L4-5. For central venous access for long-term IV antibiotics. EXAM: ULTRASOUND AND FLUOROSCOPIC GUIDED PICC LINE INSERTION MEDICATIONS: 1% lidocaine 4 mL CONTRAST:  8 mL FLUOROSCOPY: Radiation Exposure Index (as provided by the fluoroscopic device): 6.1 mGy Kerma COMPLICATIONS: None immediate. TECHNIQUE: The procedure, risks, benefits, and alternatives were explained to the patient and informed written consent was obtained. The right upper extremity was prepped with chlorhexidine in Travell Desaulniers sterile fashion, and Gurney Balthazor sterile drape was applied covering the operative field. Maximum barrier sterile technique with sterile gowns and gloves were used for the procedure. Sanyia Dini timeout was performed prior to the initiation of the procedure. Local anesthesia was provided with 1% lidocaine. After the  overlying soft tissues were anesthetized with 1% lidocaine, Adrianne Shackleton micropuncture kit was utilized to access the right brachial vein. Real-time ultrasound guidance was utilized for vascular access including the acquisition of Kabrea Seeney permanent ultrasound image documenting patency of the accessed vessel. Shaquala Broeker guidewire was advanced to the level of the superior caval-atrial junction for measurement purposes and the PICC line was cut to length. Pricella Gaugh peel-away sheath was placed and Tyiesha Brackney 35 cm, 5 [place], dual lumen was inserted however I could not advance the catheter past the axilla. Venogram was done via the sheath which revealed possible extravasation. Further attempts at placement of the PICC line on the right arm were aborted and the sheath was removed. Pressure was held and Coulton Schlink dressing was placed and I moved to the left arm. The left upper extremity was prepped with chlorhexidine in Lequisha Cammack sterile fashion, and Alban Marucci sterile drape was applied covering the operative field. Maximum barrier sterile technique  with sterile gowns and gloves were used for the procedure. Toye Rouillard timeout was performed prior to the initiation of the procedure. Local anesthesia was provided with 1% lidocaine. After the overlying soft tissues were anesthetized with 1% lidocaine, Whitten Andreoni micropuncture kit was utilized to access the left brachial vein. Real-time ultrasound guidance was utilized for vascular access including the acquisition of Roderica Cathell permanent ultrasound image documenting patency of the accessed vessel. I attempted to advance the guidewire however was unsuccessful. Ultrasound of the accessed vein revealed vasospasm. Given the difficulty I was having placing the PICC line I requested Dr. Corliss Blacker assistance. He was able to successfully access the brachial vein. The guidewire was advanced to the level of the superior caval atrial junction. He placed the PICC line over the wire and was able to advance Tylee Yum 35 cm, 5 [place], dual lumen to level of the superior caval-atrial junction. Siddarth Hsiung  post procedure spot fluoroscopic was obtained. The catheter easily aspirated and flushed and was secured in place. Kayle Correa dressing was placed. The patient tolerated the procedure well without immediate post procedural complication. FINDINGS: After catheter placement, the tip lies within the superior cavoatrial junction. The catheter aspirates and flushes normally and is ready for immediate use. The procedure was difficult secondary to the patient having small veins and vasospasm. Recommend all future attempts at [facility] line placement be in the left arm and done in interventional radiology. IMPRESSION: Successful ultrasound and fluoroscopic guided placement of Kasia Trego left brachial vein approach, 35 cm, 5 French, dual lumen PICC with tip at the superior caval-atrial junction. The PICC line is ready for immediate use. Procedure performed by: Gareth Eagle, PA-C and Dr. Albin Felling Electronically Signed   By: Albin Felling M.D.   On: 10/19/2021 14:30   IR US Guide Vasc Access Right  Result Date: 10/19/2021 INDICATION: Candida albicans bacteremia, larger tricuspid valve vegetation and concern on imaging for new septic arthritis at L4-5. For central venous access for long-term IV antibiotics. EXAM: ULTRASOUND AND FLUOROSCOPIC GUIDED PICC LINE INSERTION MEDICATIONS: 1% lidocaine 4 mL CONTRAST:  8 mL FLUOROSCOPY: Radiation Exposure Index (as provided by the fluoroscopic device): 6.1 mGy Kerma COMPLICATIONS: None immediate. TECHNIQUE: The procedure, risks, benefits, and alternatives were explained to the patient and informed written consent was obtained. The right upper extremity was prepped with chlorhexidine in Xiomar Crompton sterile fashion, and Willard Farquharson sterile drape was applied covering the operative field. Maximum barrier sterile technique with sterile gowns and gloves were used for the procedure. Mckinleigh Schuchart timeout was performed prior to the initiation of the procedure. Local anesthesia was provided with 1% lidocaine. After the overlying soft tissues  were anesthetized with 1% lidocaine, Takeshi Teasdale micropuncture kit was utilized to access the right brachial vein. Real-time ultrasound guidance was utilized for vascular access including the acquisition of Mikaila Grunert permanent ultrasound image documenting patency of the accessed vessel. Mylin Gignac guidewire was advanced to the level of the superior caval-atrial junction for measurement purposes and the PICC line was cut to length. Jalisa Sacco peel-away sheath was placed and Malkie Wille 35 cm, 5 [place], dual lumen was inserted however I could not advance the catheter past the axilla. Venogram was done via the sheath which revealed possible extravasation. Further attempts at placement of the PICC line on the right arm were aborted and the sheath was removed. Pressure was held and Kalev Temme dressing was placed and I moved to the left arm. The left upper extremity was prepped with chlorhexidine in Cristina Mattern sterile fashion, and Abeni Finchum sterile drape was applied covering the operative field.  Maximum barrier sterile technique with sterile gowns and gloves were used for the procedure. Jerimyah Vandunk timeout was performed prior to the initiation of the procedure. Local anesthesia was provided with 1% lidocaine. After the overlying soft tissues were anesthetized with 1% lidocaine, Dandrea Medders micropuncture kit was utilized to access the left brachial vein. Real-time ultrasound guidance was utilized for vascular access including the acquisition of Chala Gul permanent ultrasound image documenting patency of the accessed vessel. I attempted to advance the guidewire however was unsuccessful. Ultrasound of the accessed vein revealed vasospasm. Given the difficulty I was having placing the PICC line I requested Dr. Corliss Blacker assistance. He was able to successfully access the brachial vein. The guidewire was advanced to the level of the superior caval atrial junction. He placed the PICC line over the wire and was able to advance Paulyne Mooty 35 cm, 5 [place], dual lumen to level of the superior caval-atrial junction. Tyjuan Demetro post procedure spot  fluoroscopic was obtained. The catheter easily aspirated and flushed and was secured in place. Flint Hakeem dressing was placed. The patient tolerated the procedure well without immediate post procedural complication. FINDINGS: After catheter placement, the tip lies within the superior cavoatrial junction. The catheter aspirates and flushes normally and is ready for immediate use. The procedure was difficult secondary to the patient having small veins and vasospasm. Recommend all future attempts at [facility] line placement be in the left arm and done in interventional radiology. IMPRESSION: Successful ultrasound and fluoroscopic guided placement of Nakeem Murnane left brachial vein approach, 35 cm, 5 French, dual lumen PICC with tip at the superior caval-atrial junction. The PICC line is ready for immediate use. Procedure performed by: Gareth Eagle, PA-C and Dr. Albin Felling Electronically Signed   By: Albin Felling M.D.   On: 10/19/2021 14:30   [place] EKG SITE RITE  Result Date: 10/18/2021 If Site Rite image not attached, placement could not be confirmed due to current cardiac rhythm.       Scheduled Meds:  benzonatate  200 mg Oral TID   [START ON 10/20/2021] Chlorhexidine Gluconate Cloth  6 each Topical Q0600   enoxaparin (LOVENOX) injection  80 mg Subcutaneous Q12H   ferrous gluconate  324 mg Oral Q breakfast   gabapentin  300 mg Oral TID   pantoprazole  40 mg Oral Daily   pregabalin  75 mg Oral TID   Continuous Infusions:  sodium chloride Stopped (10/17/21 1525)   micafungin (MYCAMINE) 150 mg in sodium chloride 0.9 % 100 mL IVPB Stopped (10/18/21 1944)   vancomycin       LOS: 8 days    Time spent: over 30 min    Fayrene Helper, MD [facility]   To contact the attending provider between 7A-7P or the covering provider during after hours 7P-7A, please log into the web site [email or web address] and access using universal [place] password for that web site. If you do not have the password, please call the  hospital operator.  10/19/2021, 5:51 PM

## 2021-10-20 DIAGNOSIS — I2602 Saddle embolus of pulmonary artery with acute cor pulmonale: Secondary | ICD-10-CM | POA: Diagnosis not present

## 2021-10-20 LAB — COMPREHENSIVE METABOLIC PANEL
ALT: 8 U/L (ref 0–44)
AST: 12 U/L — ABNORMAL LOW (ref 15–41)
Albumin: 2.1 g/dL — ABNORMAL LOW (ref 3.5–5.0)
Alkaline Phosphatase: 57 U/L (ref 38–126)
Anion gap: 9 (ref 5–15)
BUN: 5 mg/dL — ABNORMAL LOW (ref 6–20)
CO2: 25 mmol/L (ref 22–32)
Calcium: 8.1 mg/dL — ABNORMAL LOW (ref 8.9–10.3)
Chloride: 99 mmol/L (ref 98–111)
Creatinine, Ser: 0.61 mg/dL (ref 0.44–1.00)
GFR, Estimated: >60 mL/min (ref >60)
Glucose, Bld: 125 mg/dL — ABNORMAL HIGH (ref 70–99)
Potassium: 4.1 mmol/L (ref 3.5–5.1)
Sodium: 133 mmol/L — ABNORMAL LOW (ref 135–145)
Total Bilirubin: 0.6 mg/dL (ref 0.3–1.2)
Total Protein: 7.3 g/dL (ref 6.5–8.1)

## 2021-10-20 LAB — CBC WITH DIFFERENTIAL/PLATELET
Abs Immature Granulocytes: 0.17 10*3/uL — ABNORMAL HIGH (ref 0.00–0.07)
Basophils Absolute: 0.1 10*3/uL (ref 0.0–0.1)
Basophils Relative: 0 %
Eosinophils Absolute: 0.4 10*3/uL (ref 0.0–0.5)
Eosinophils Relative: 2 %
HCT: 27.7 % — ABNORMAL LOW (ref 36.0–46.0)
Hemoglobin: 8 g/dL — ABNORMAL LOW (ref 12.0–15.0)
Immature Granulocytes: 1 %
Lymphocytes Relative: 19 %
Lymphs Abs: 3.2 10*3/uL (ref 0.7–4.0)
MCH: 23.3 pg — ABNORMAL LOW (ref 26.0–34.0)
MCHC: 28.9 g/dL — ABNORMAL LOW (ref 30.0–36.0)
MCV: 80.8 fL (ref 80.0–100.0)
Monocytes Absolute: 1.1 10*3/uL — ABNORMAL HIGH (ref 0.1–1.0)
Monocytes Relative: 7 %
Neutro Abs: 11.6 10*3/uL — ABNORMAL HIGH (ref 1.7–7.7)
Neutrophils Relative %: 71 %
Platelets: 397 10*3/uL (ref 150–400)
RBC: 3.43 MIL/uL — ABNORMAL LOW (ref 3.87–5.11)
RDW: 22 % — ABNORMAL HIGH (ref 11.5–15.5)
WBC: 16.4 10*3/uL — ABNORMAL HIGH (ref 4.0–10.5)
nRBC: 0.2 % (ref 0.0–0.2)

## 2021-10-20 LAB — PHOSPHORUS: Phosphorus: 3.8 mg/dL (ref 2.5–4.6)

## 2021-10-20 LAB — MAGNESIUM: Magnesium: 2 mg/dL (ref 1.7–2.4)

## 2021-10-20 NOTE — Progress Notes (Addendum)
ANTICOAGULATION CONSULT NOTE - Follow Up Consult  Pharmacy Consult for Enoxaparin Indication: pulmonary embolus  Allergies  Allergen Reactions   Naltrexone Anxiety    Severe anxiety Restlessness  Vomiting     Patient Measurements: Height: 5\' 6"  (167.6 cm) Weight: 82.6 kg (182 lb 3.2 oz) IBW/kg (Calculated) : 59.3 Heparin Dosing Weight: 76.7 kg  Vital Signs: Temp: 98 F (36.7 C) (06/16 0808) Temp Source: Oral (06/16 0808) BP: 140/91 (06/16 0808) Pulse Rate: 105 (06/16 0808)  Labs: Recent Labs    10/18/21 0052 10/19/21 0202 10/20/21 0557  HGB 8.5* 8.6* 8.0*  HCT 28.8* 29.0* 27.7*  PLT 368 384 397  CREATININE 0.60 0.56 0.61     Estimated Creatinine Clearance: 107.3 mL/min (by C-G formula based on SCr of 0.61 mg/dL).   Medications:  Scheduled:   benzonatate  200 mg Oral TID   Chlorhexidine Gluconate Cloth  6 each Topical Q0600   enoxaparin (LOVENOX) injection  80 mg Subcutaneous Q12H   ferrous gluconate  324 mg Oral Q breakfast   gabapentin  300 mg Oral TID   pantoprazole  40 mg Oral Daily   pregabalin  75 mg Oral TID   Infusions:   sodium chloride Stopped (10/17/21 1525)   micafungin (MYCAMINE) 150 mg in sodium chloride 0.9 % 100 mL IVPB 150 mg (10/19/21 1901)   vancomycin 1,250 mg (10/19/21 2132)    Assessment: 36 yo F transferred from OSH with PE. Pt started on IV heparin drip. Pt has hx septic pulmonary emboli and reports adherence to PTA Eliquis. Hematology recommended enoxaparin for PE treatment and pharmacy dosing.  Patient refused a few doses between 6/14>6/15, has received the past 2 doses. Will consider checking a anti-xa level over the weekend/early next week if patient remains compliant.   Hemoglobin outlier on 6/13 at 9.5, other values stable at 8-8.6. No bleeding issues noted. Platelet count normal upper 300s.   -enoxaparin copay= $0  Goal of Therapy:  Anti-Xa level 0.6-1 units/ml 4hrs after LMWH dose given Monitor platelets by  anticoagulation protocol: Yes   Plan:  Continue Enoxaparin 80 mg SQ q12h. Will follow patient progress Would continue to check cbc at least every 72 hours Do not expect patient to need dose adjustments, pharmacy to sign off protocol and follow peripherally  7/13 PharmD., BCPS Clinical Pharmacist 10/20/2021 10:16 AM

## 2021-10-20 NOTE — Progress Notes (Signed)
Brief ID Note:   PICC back in place 6/15 and on vancoycin and micafungin again.  Pending plan with CT surgery --> will follow along for scheduling and intermittently see her.   Ongoing leukocytosis in setting of new back infection (no abscess, only SA described on MRI) and larger tricuspid valve vegetation. She is afebrile now at nearly 48h.   Will see back next week - Dr. Luciana Axe is available over the weekend should any ID questions/concerns arise.     Rexene Alberts, MSN, NP-C [facility]  [email or web address] Pager: [phone] Office: [phone] [facility] Main Line: [phone] *Secure Chat Communication Welcome

## 2021-10-20 NOTE — Progress Notes (Signed)
PROGRESS NOTE    Latoya [name]  KCL:[id number] DOB: 01/21/1986 DOA: 10/11/2021 PCP: Pcp, No  No chief complaint on file.   Brief Narrative:  Latoya [name] is Latoya [name] 36 y.o. female with Latoya [name] history of MSSA tricuspid valve endocarditis, MRSA L4-L5 facet  join septic discitis in 07/2019 and more recently diagnosis of MRSA bacteremia with infective endocarditis and tricuspid valve vegetation diagnosed 07/2021. Patient presented from [facility] for saddle pulmonary embolism. Blood cultures growing candida albicans. Patient started on micafungin. PICC placement deferred and is pending negative repeat blood cultures. Cardiothoracic surgery considering PFO closure/angio Vac for removal of TV vegetation.      Assessment & Plan:   Principal Problem:   Acute saddle pulmonary embolism (HCC) Active Problems:   Candidemia ([place])   Endocarditis of tricuspid valve   Lumbar discitis   Pneumonia of left lower lobe due to infectious organism   MRSA bacteremia   IV drug abuse ([place])   Hyponatremia   Thrombocytopenia (HCC)   Pleuritic chest pain   Assessment and Plan: * Acute saddle pulmonary embolism (HCC) Noted on CTA chest. Acute on chronic diagnosis while on Eliquis PO. Started on Heparin IV. RV heart strain noted on imaging. Transthoracic Echocardiogram obtained which showed stable RV dysfunction from prior. Complicated by TV endocarditis with history of septic emboli. PCCM consulted with recommendations for continued anticoagulation and no thrombectomy. On room air. Hematology recommending Lovenox for treatment. Heparin not therapeutic. No procedures anticipated in the near future. Transitioned to Lovenox on 6/10. -Continue Lovenox (she's more agreeable with this now) -unchanged chest discomfort since admission related to VTE, follow (additional w/u as indicated) -telemetry monitoring  Candidemia ([place]) New diagnosis with blood culture (6/7) resulting yeast. Patient started on micafungin IV.  Leukocytosis fluctuating, follow. -Continue micafungin IV -Follow-up repeat blood cultures (6/10) NGx5 -PICC placed by IR 6/15  Endocarditis of tricuspid valve Previously diagnosed. Initial plan for angio vac which was canceled secondary to PFO. Patient was treated for this as an inpatient and discharged on linezolid. Vancomycin and Cefepime restarted on admission. ID consulted. 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  In addition, MRI lumbar spine with evidence of discitis. - fever 6/14, follow -ID recommendations: continue vancomycin and micafungin (fluconazole/linezolid while awaiting access).  She's completed cefepime for possible pneumonia. -TCTS consulted on 6/8 for recommendations on persistent large TV vegetation. Plan to consider occlusion of her PFO for consideration of angio VAC debridement (Dr. Kipp Brood currently out of town, will follow up - see 6/9 note)  Lumbar discitis Noted on imaging. ID consulted. Neurosurgery curbsided. Recommendations for IR aspiration/biopsy. IR unable to perform secondary to no target for aspiration/biopsy. In setting of persistent fevers concerning for persistent bacteremia. -See problem, Endocarditis of tricuspid valve  Pneumonia of left lower lobe due to infectious organism Patient with Latoya [name] history of septic emboli on prior admission. Patient noted to have evidence of LLL pneumonia. Started on antibiotics as mentioned in problem, Endocarditis of tricuspid valve. Cefepime completed per ID  MRSA bacteremia -See problem, Endocarditis of tricuspid valve  IV drug abuse (Latoya [name]) History of. Patient reports continued abstinence since March of 2023. Patient reports not requiring resources for continued abstinence.  Thrombocytopenia ([place]) resolved  Hyponatremia Mild. Possibly in setting of acute illness/pain. Back to wnl.  Pleuritic chest pain Secondary to PE and persistent coughing. -continue Tussionex  PRN -limit IV meds -Continue tessalon perles       DVT prophylaxis: lovenox Code Status: full Family Communication: father, children at  bedside Disposition:   Status is: Inpatient Remains inpatient appropriate because: need for abx, ID, CT surgery c/s   Consultants:  ID CT surgery PCCM IR  Procedures:  Picc placement by IR  Antimicrobials:  Anti-infectives (From admission, onward)    Start     Dose/Rate Route Frequency Ordered Stop   10/19/21 2200  vancomycin (VANCOREADY) IVPB 1250 mg/250 mL        1,250 mg 166.7 mL/hr over 90 Minutes Intravenous Every 12 hours 10/19/21 1341     10/18/21 2200  linezolid (ZYVOX) tablet 600 mg  Status:  Discontinued        600 mg Oral Every 12 hours 10/18/21 1600 10/19/21 1341   10/18/21 1700  fluconazole (DIFLUCAN) tablet 800 mg        800 mg Oral  Once 10/18/21 1600 10/18/21 1657   10/17/21 2200  vancomycin (VANCOREADY) IVPB 1250 mg/250 mL  Status:  Discontinued        1,250 mg 166.7 mL/hr over 90 Minutes Intravenous Every 12 hours 10/17/21 1144 10/18/21 1600   10/17/21 1600  micafungin (MYCAMINE) 150 mg in sodium chloride 0.9 % 100 mL IVPB       See Hyperspace for full Linked Orders Report.   150 mg 107.5 mL/hr over 1 Hours Intravenous Every 24 hours 10/14/21 1115     10/14/21 1600  micafungin (MYCAMINE) 150 mg in sodium chloride 0.9 % 100 mL IVPB       See Hyperspace for full Linked Orders Report.   150 mg 107.5 mL/hr over 1 Hours Intravenous Every 24 hours 10/14/21 1115 10/16/21 1746   10/13/21 2200  vancomycin (VANCOREADY) IVPB 1500 mg/300 mL  Status:  Discontinued        1,500 mg 150 mL/hr over 120 Minutes Intravenous Every 12 hours 10/13/21 1321 10/17/21 1144   10/12/21 1700  micafungin (MYCAMINE) 150 mg in sodium chloride 0.9 % 100 mL IVPB  Status:  Discontinued        150 mg 115 mL/hr over 1 Hours Intravenous Every 24 hours 10/12/21 1601 10/14/21 1115   10/11/21 2200  vancomycin (VANCOREADY) IVPB 1250 mg/250 mL  Status:   Discontinued        1,250 mg 166.7 mL/hr over 90 Minutes Intravenous Every 12 hours 10/11/21 0903 10/13/21 1321   10/11/21 1000  linezolid (ZYVOX) tablet 600 mg  Status:  Discontinued        600 mg Oral 2 times daily 10/11/21 0149 10/11/21 0813   10/11/21 1000  ceFEPIme (MAXIPIME) 2 g in sodium chloride 0.9 % 100 mL IVPB  Status:  Discontinued        2 g 200 mL/hr over 30 Minutes Intravenous Every 8 hours 10/11/21 0901 10/16/21 0938   10/11/21 1000  vancomycin (VANCOREADY) IVPB 1750 mg/350 mL        1,750 mg 175 mL/hr over 120 Minutes Intravenous  Once 10/11/21 0901 10/11/21 1242       Subjective: No complaints  Objective: Vitals:   10/19/21 0925 10/19/21 0944 10/19/21 1951 10/20/21 0808  BP: 116/81 124/83 131/84 (!) 140/91  Pulse: 100 99 (!) 101 (!) 105  Resp:  (!) '21 20 17  ' Temp: 97.9 F (36.6 C) 98.1 F (36.7 C) 98.3 F (36.8 C) 98 F (36.7 C)  TempSrc: Oral Oral Oral Oral  SpO2:  94% 95%   Weight:      Height:        Intake/Output Summary (Last 24 hours) at 10/20/2021 1042 Last data filed at  10/20/2021 0300 Gross per 24 hour  Intake 358 ml  Output --  Net 358 ml   Filed Weights   10/11/21 0023  Weight: 82.6 kg    Examination:  General: No acute distress. Cardiovascular: RRR Lungs: unlabored Neurological: Alert and oriented 3. Moves all extremities 4 with equal strength. Cranial nerves II through XII grossly intact. Extremities: No clubbing or cyanosis. No edema.  Data Reviewed: I have personally reviewed following labs and imaging studies  CBC: Recent Labs  Lab 10/16/21 0650 10/17/21 0149 10/18/21 0052 10/19/21 0202 10/20/21 0557  WBC 14.9* 16.0* 18.6* 15.7* 16.4*  NEUTROABS  --   --   --  11.3* 11.6*  HGB 8.6* 9.5* 8.5* 8.6* 8.0*  HCT 28.8* 32.8* 28.8* 29.0* 27.7*  MCV 77.8* 79.8* 78.5* 78.8* 80.8  PLT 251 334 368 384 244    Basic Metabolic Panel: Recent Labs  Lab 10/16/21 0650 10/17/21 0149 10/18/21 0052 10/19/21 0202 10/20/21 0557   NA 134* 137 134* 135 133*  K 5.1 4.1 3.5 3.4* 4.1  CL 106 107 105 101 99  CO2 15* 20* '23 22 25  ' GLUCOSE 99 116* 121* 112* 125*  BUN '8 7 6 ' <5* 5*  CREATININE 0.56 0.66 0.60 0.56 0.61  CALCIUM 7.9* 8.4* 7.7* 8.2* 8.1*  MG  --   --   --  1.9 2.0  PHOS  --   --   --  3.4 3.8    GFR: Estimated Creatinine Clearance: 107.3 mL/min (by C-G formula based on SCr of 0.61 mg/dL).  Liver Function Tests: Recent Labs  Lab 10/19/21 0202 10/20/21 0557  AST 13* 12*  ALT 7 8  ALKPHOS 62 57  BILITOT 0.6 0.6  PROT 7.6 7.3  ALBUMIN 2.3* 2.1*    CBG: No results for input(s): "GLUCAP" in the last 168 hours.   Recent Results (from the past 240 hour(s))  Culture, blood (Routine X 2) w Reflex to ID Panel     Status: Abnormal   Collection Time: 10/11/21  2:57 AM   Specimen: BLOOD LEFT HAND  Result Value Ref Range Status   Specimen Description BLOOD LEFT HAND  Final   Special Requests   Final    AEROBIC BOTTLE ONLY Blood Culture results may not be optimal due to an excessive volume of blood received in culture bottles   Culture  Setup Time (Brookelin Felber)  Final    YEAST AEROBIC BOTTLE ONLY CRITICAL RESULT CALLED TO, READ BACK BY AND VERIFIED WITH: PHARMD CARLA JARDIN ON 10/12/21 @ 1723 BY DRT    Culture (Brendt Dible)  Final    CANDIDA ALBICANS SEE SEPARATE REPORT FOR [facility] SUSCEPTIBILITY RESULTS Performed at [facility], [place] [address], [place], [place] [address]    Report Status 10/19/2021 FINAL  Final  Culture, blood (Routine X 2) w Reflex to ID Panel     Status: Abnormal   Collection Time: 10/11/21  2:57 AM   Specimen: BLOOD LEFT HAND  Result Value Ref Range Status   Specimen Description BLOOD LEFT HAND  Final   Special Requests   Final    AEROBIC BOTTLE ONLY Blood Culture results may not be optimal due to an excessive volume of blood received in culture bottles   Culture  Setup Time   Final    YEAST WITH PSEUDOHYPHAE AEROBIC BOTTLE ONLY CRITICAL RESULT CALLED TO, READ BACK BY AND VERIFIED  WITH: PHARMD CARLA JARDIN ON 10/12/21 @ 1837 BY DRT    Culture CANDIDA ALBICANS (Safira Proffit)  Final   Report Status 10/19/2021 FINAL  Final  Blood Culture ID Panel (Reflexed)     Status: Abnormal   Collection Time: 10/11/21  2:57 AM  Result Value Ref Range Status   Enterococcus faecalis NOT DETECTED NOT DETECTED Final   Enterococcus Faecium NOT DETECTED NOT DETECTED Final   Listeria monocytogenes NOT DETECTED NOT DETECTED Final   Staphylococcus species NOT DETECTED NOT DETECTED Final   Staphylococcus aureus (BCID) NOT DETECTED NOT DETECTED Final   Staphylococcus epidermidis NOT DETECTED NOT DETECTED Final   Staphylococcus lugdunensis NOT DETECTED NOT DETECTED Final   Streptococcus species NOT DETECTED NOT DETECTED Final   Streptococcus agalactiae NOT DETECTED NOT DETECTED Final   Streptococcus pneumoniae NOT DETECTED NOT DETECTED Final   Streptococcus pyogenes NOT DETECTED NOT DETECTED Final   Cameron Schwinn.calcoaceticus-baumannii NOT DETECTED NOT DETECTED Final   Bacteroides fragilis NOT DETECTED NOT DETECTED Final   Enterobacterales NOT DETECTED NOT DETECTED Final   Enterobacter cloacae complex NOT DETECTED NOT DETECTED Final   Escherichia coli NOT DETECTED NOT DETECTED Final   Klebsiella aerogenes NOT DETECTED NOT DETECTED Final   Klebsiella oxytoca NOT DETECTED NOT DETECTED Final   Klebsiella pneumoniae NOT DETECTED NOT DETECTED Final   Proteus species NOT DETECTED NOT DETECTED Final   Salmonella species NOT DETECTED NOT DETECTED Final   Serratia marcescens NOT DETECTED NOT DETECTED Final   Haemophilus influenzae NOT DETECTED NOT DETECTED Final   Neisseria meningitidis NOT DETECTED NOT DETECTED Final   Pseudomonas aeruginosa NOT DETECTED NOT DETECTED Final   Stenotrophomonas maltophilia NOT DETECTED NOT DETECTED Final   Candida albicans DETECTED (Myshawn [place]) NOT DETECTED Final    Comment: CRITICAL RESULT CALLED TO, READ BACK BY AND VERIFIED WITH: PHARMD CARLA JARDIN ON 10/12/21 @ 1723 BY DRT    Candida auris  NOT DETECTED NOT DETECTED Final   Candida glabrata NOT DETECTED NOT DETECTED Final   Candida krusei NOT DETECTED NOT DETECTED Final   Candida parapsilosis NOT DETECTED NOT DETECTED Final   Candida tropicalis NOT DETECTED NOT DETECTED Final   Cryptococcus neoformans/gattii NOT DETECTED NOT DETECTED Final    Comment: Performed at [facility], [address] [place], [place] [address]  Antifungal AST 9 Drug Panel     Status: None   Collection Time: 10/11/21  2:57 AM  Result Value Ref Range Status   Organism ID, Yeast Candida albicans  Corrected    Comment: (NOTE) Identification performed by account, not confirmed by this laboratory. CORRECTED ON 06/15 AT 1137: PREVIOUSLY REPORTED AS Preliminary report    Amphotericin B MIC 0.5 ug/mL  Final    Comment: (NOTE) Breakpoints have been established for only some organism-drug combinations as indicated. This test was developed and its performance characteristics determined by [facility]. It has not been cleared or approved by the Food and Drug Administration.    Anidulafungin MIC Comment  Final    Comment: (NOTE) 0.06 ug/mL Susceptible Breakpoints have been established for only some organism-drug combinations as indicated. This test was developed and its performance characteristics determined by [facility]. It has not been cleared or approved by the Food and Drug Administration.    Caspofungin MIC Comment  Final    Comment: (NOTE) 0.25 ug/mL Susceptible Breakpoints have been established for only some organism-drug combinations as indicated. This test was developed and its performance characteristics determined by [facility]. It has not been cleared or approved by the Food and Drug Administration.    Micafungin MIC Comment  Final    Comment: (NOTE) 0.016 ug/mL  Susceptible Breakpoints have been established for only some organism-drug combinations as indicated. This test was developed and its performance characteristics determined by  [facility]. It has not been cleared or approved by the Food and Drug Administration.    Posaconazole MIC 0.03 ug/mL  Final    Comment: (NOTE) Breakpoints have been established for only some organism-drug combinations as indicated. This test was developed and its performance characteristics determined by [facility]. It has not been cleared or approved by the Food and Drug Administration.    Fluconazole Islt MIC Comment  Final    Comment: (NOTE) 0.25 ug/mL Susceptible Breakpoints have been established for only some organism-drug combinations as indicated. This test was developed and its performance characteristics determined by [facility]. It has not been cleared or approved by the Food and Drug Administration.    Flucytosine MIC 0.12 ug/mL  Final    Comment: (NOTE) Breakpoints have been established for only some organism-drug combinations as indicated. This test was developed and its performance characteristics determined by [facility]. It has not been cleared or approved by the Food and Drug Administration.    Itraconazole MIC 0.06 ug/mL  Final    Comment: (NOTE) Breakpoints have been established for only some organism-drug combinations as indicated. This test was developed and its performance characteristics determined by [facility]. It has not been cleared or approved by the Food and Drug Administration.    Voriconazole MIC Comment  Final    Comment: (NOTE) 0.008 ug/mL Susceptible Breakpoints have been established for only some organism-drug combinations as indicated. This test was developed and its performance characteristics determined by [facility]. It has not been cleared or approved by the Food and Drug Administration. Performed At: [facility] [place], [place] [id number] Rush Farmer MD MW:[id number]    Source [phone] BLD  C ALBICANS  Final    Comment: Performed at [facility], [place] [address] [place], [place] [address]  MRSA Next Gen by PCR,  Nasal     Status: None   Collection Time: 10/11/21  9:58 AM   Specimen: Nasal Mucosa; Nasal Swab  Result Value Ref Range Status   MRSA by PCR Next Gen NOT DETECTED NOT DETECTED Final    Comment: (NOTE) The GeneXpert MRSA Assay (FDA approved for NASAL specimens only), is one component of Niyah Mamaril comprehensive MRSA colonization surveillance program. It is not intended to diagnose MRSA infection nor to guide or monitor treatment for MRSA infections. Test performance is not FDA approved in patients less than 69 years old. Performed at [facility], [place] [address] [place], [place] [address]   Expectorated Sputum Assessment w Gram Stain, Rflx to Resp Cult     Status: None   Collection Time: 10/13/21  6:11 AM   Specimen: Expectorated Sputum  Result Value Ref Range Status   Specimen Description EXPECTORATED SPUTUM  Final   Special Requests NONE  Final   Sputum evaluation   Final    THIS SPECIMEN IS ACCEPTABLE FOR SPUTUM CULTURE Performed at [facility], [place] [address], [place], [place] [address]    Report Status 10/17/2021 FINAL  Final  Culture, Respiratory w Gram Stain     Status: None   Collection Time: 10/13/21  6:11 AM  Result Value Ref Range Status   Specimen Description EXPECTORATED SPUTUM  Final   Special Requests NONE Reflexed from [id number]  Final   Gram Stain   Final    FEW SQUAMOUS EPITHELIAL CELLS PRESENT RARE WBC PRESENT, PREDOMINANTLY MONONUCLEAR  MODERATE GRAM NEGATIVE RODS FEW GRAM POSITIVE COCCI IN PAIRS    Culture   Final    FEW Normal respiratory flora-no Staph aureus or Pseudomonas seen Performed at [facility], [address], [place], [place] [address]    Report Status 10/16/2021 FINAL  Final  Culture, blood (Routine X 2) w Reflex to ID Panel     Status: None   Collection Time: 10/14/21  1:43 PM   Specimen: BLOOD LEFT ARM  Result Value Ref Range Status   Specimen Description BLOOD LEFT ARM  Final   Special Requests   Final    BOTTLES DRAWN  AEROBIC AND ANAEROBIC Blood Culture results may not be optimal due to an inadequate volume of blood received in culture bottles   Culture   Final    NO GROWTH 5 DAYS Performed at [facility], [place] [address], [place], [place] [address]    Report Status 10/19/2021 FINAL  Final  Culture, blood (Routine X 2) w Reflex to ID Panel     Status: None   Collection Time: 10/14/21  1:44 PM   Specimen: BLOOD LEFT HAND  Result Value Ref Range Status   Specimen Description BLOOD LEFT HAND  Final   Special Requests   Final    BOTTLES DRAWN AEROBIC ONLY Blood Culture results may not be optimal due to an inadequate volume of blood received in culture bottles   Culture   Final    NO GROWTH 5 DAYS Performed at [facility], [place] [address], [place], [place] [address]    Report Status 10/19/2021 FINAL  Final         Radiology Studies: IR PICC PLACEMENT RIGHT >5 YRS INC IMG GUIDE  Result Date: 10/19/2021 INDICATION: Candida albicans bacteremia, larger tricuspid valve vegetation and concern on imaging for new septic arthritis at L4-5. For central venous access for long-term IV antibiotics. EXAM: ULTRASOUND AND FLUOROSCOPIC GUIDED PICC LINE INSERTION MEDICATIONS: 1% lidocaine 4 mL CONTRAST:  8 mL FLUOROSCOPY: Radiation Exposure Index (as provided by the fluoroscopic device): 6.1 mGy Kerma COMPLICATIONS: None immediate. TECHNIQUE: The procedure, risks, benefits, and alternatives were explained to the patient and informed written consent was obtained. The right upper extremity was prepped with chlorhexidine in Adamary Savary sterile fashion, and Tunya Held sterile drape was applied covering the operative field. Maximum barrier sterile technique with sterile gowns and gloves were used for the procedure. Demetries Coia timeout was performed prior to the initiation of the procedure. Local anesthesia was provided with 1% lidocaine. After the overlying soft tissues were anesthetized with 1% lidocaine, Wilkin Lippy micropuncture kit was utilized to  access the right brachial vein. Real-time ultrasound guidance was utilized for vascular access including the acquisition of Towanna Avery permanent ultrasound image documenting patency of the accessed vessel. Octavian Godek guidewire was advanced to the level of the superior caval-atrial junction for measurement purposes and the PICC line was cut to length. Fumiko Cham peel-away sheath was placed and Shaena Parkerson 35 cm, 5 [place], dual lumen was inserted however I could not advance the catheter past the axilla. Venogram was done via the sheath which revealed possible extravasation. Further attempts at placement of the PICC line on the right arm were aborted and the sheath was removed. Pressure was held and Arabel Barcenas dressing was placed and I moved to the left arm. The left upper extremity was prepped with chlorhexidine in Tinia Oravec sterile fashion, and Lyden Redner sterile drape was applied covering the operative field. Maximum barrier sterile technique with sterile gowns and gloves  were used for the procedure. Kamyra Schroeck timeout was performed prior to the initiation of the procedure. Local anesthesia was provided with 1% lidocaine. After the overlying soft tissues were anesthetized with 1% lidocaine, Guida Asman micropuncture kit was utilized to access the left brachial vein. Real-time ultrasound guidance was utilized for vascular access including the acquisition of Maleeha Halls permanent ultrasound image documenting patency of the accessed vessel. I attempted to advance the guidewire however was unsuccessful. Ultrasound of the accessed vein revealed vasospasm. Given the difficulty I was having placing the PICC line I requested Dr. Corliss Blacker assistance. He was able to successfully access the brachial vein. The guidewire was advanced to the level of the superior caval atrial junction. He placed the PICC line over the wire and was able to advance Adaijah Endres 35 cm, 5 [place], dual lumen to level of the superior caval-atrial junction. Aziya Arena post procedure spot fluoroscopic was obtained. The catheter easily aspirated and flushed and was  secured in place. Ilija Maxim dressing was placed. The patient tolerated the procedure well without immediate post procedural complication. FINDINGS: After catheter placement, the tip lies within the superior cavoatrial junction. The catheter aspirates and flushes normally and is ready for immediate use. The procedure was difficult secondary to the patient having small veins and vasospasm. Recommend all future attempts at [facility] line placement be in the left arm and done in interventional radiology. IMPRESSION: Successful ultrasound and fluoroscopic guided placement of Brayn Eckstein left brachial vein approach, 35 cm, 5 [place], dual lumen PICC with tip at the superior caval-atrial junction. The PICC line is ready for immediate use. Procedure performed by: Gareth Eagle, PA-C and Dr. Albin Felling Electronically Signed   By: Albin Felling M.D.   On: 10/19/2021 14:30   IR Veno/Ext/Uni Right  Result Date: 10/19/2021 INDICATION: Candida albicans bacteremia, larger tricuspid valve vegetation and concern on imaging for new septic arthritis at L4-5. For central venous access for long-term IV antibiotics. EXAM: ULTRASOUND AND FLUOROSCOPIC GUIDED PICC LINE INSERTION MEDICATIONS: 1% lidocaine 4 mL CONTRAST:  8 mL FLUOROSCOPY: Radiation Exposure Index (as provided by the fluoroscopic device): 6.1 mGy Kerma COMPLICATIONS: None immediate. TECHNIQUE: The procedure, risks, benefits, and alternatives were explained to the patient and informed written consent was obtained. The right upper extremity was prepped with chlorhexidine in Kona Yusuf sterile fashion, and Rena Hunke sterile drape was applied covering the operative field. Maximum barrier sterile technique with sterile gowns and gloves were used for the procedure. Roderica Cathell timeout was performed prior to the initiation of the procedure. Local anesthesia was provided with 1% lidocaine. After the overlying soft tissues were anesthetized with 1% lidocaine, Blain Hunsucker micropuncture kit was utilized to access the right brachial vein.  Real-time ultrasound guidance was utilized for vascular access including the acquisition of Nichole Neyer permanent ultrasound image documenting patency of the accessed vessel. Fateh Kindle guidewire was advanced to the level of the superior caval-atrial junction for measurement purposes and the PICC line was cut to length. Lilymae Swiech peel-away sheath was placed and Areona Homer 35 cm, 5 [place], dual lumen was inserted however I could not advance the catheter past the axilla. Venogram was done via the sheath which revealed possible extravasation. Further attempts at placement of the PICC line on the right arm were aborted and the sheath was removed. Pressure was held and Emaley Applin dressing was placed and I moved to the left arm. The left upper extremity was prepped with chlorhexidine in Severina Sykora sterile fashion, and Prarthana Parlin sterile drape was applied covering the operative field. Maximum barrier sterile technique with sterile gowns and  gloves were used for the procedure. Fayette Gasner timeout was performed prior to the initiation of the procedure. Local anesthesia was provided with 1% lidocaine. After the overlying soft tissues were anesthetized with 1% lidocaine, Vernesha Talbot micropuncture kit was utilized to access the left brachial vein. Real-time ultrasound guidance was utilized for vascular access including the acquisition of Caeli Linehan permanent ultrasound image documenting patency of the accessed vessel. I attempted to advance the guidewire however was unsuccessful. Ultrasound of the accessed vein revealed vasospasm. Given the difficulty I was having placing the PICC line I requested Dr. Corliss Blacker assistance. He was able to successfully access the brachial vein. The guidewire was advanced to the level of the superior caval atrial junction. He placed the PICC line over the wire and was able to advance Daylynn Stumpp 35 cm, 5 [place], dual lumen to level of the superior caval-atrial junction. Tarisha Fader post procedure spot fluoroscopic was obtained. The catheter easily aspirated and flushed and was secured in place. Elden Brucato dressing  was placed. The patient tolerated the procedure well without immediate post procedural complication. FINDINGS: After catheter placement, the tip lies within the superior cavoatrial junction. The catheter aspirates and flushes normally and is ready for immediate use. The procedure was difficult secondary to the patient having small veins and vasospasm. Recommend all future attempts at [facility] line placement be in the left arm and done in interventional radiology. IMPRESSION: Successful ultrasound and fluoroscopic guided placement of Aida Lemaire left brachial vein approach, 35 cm, 5 French, dual lumen PICC with tip at the superior caval-atrial junction. The PICC line is ready for immediate use. Procedure performed by: Gareth Eagle, PA-C and Dr. Albin Felling Electronically Signed   By: Albin Felling M.D.   On: 10/19/2021 14:30   IR US Guide Vasc Access Right  Result Date: 10/19/2021 INDICATION: Candida albicans bacteremia, larger tricuspid valve vegetation and concern on imaging for new septic arthritis at L4-5. For central venous access for long-term IV antibiotics. EXAM: ULTRASOUND AND FLUOROSCOPIC GUIDED PICC LINE INSERTION MEDICATIONS: 1% lidocaine 4 mL CONTRAST:  8 mL FLUOROSCOPY: Radiation Exposure Index (as provided by the fluoroscopic device): 6.1 mGy Kerma COMPLICATIONS: None immediate. TECHNIQUE: The procedure, risks, benefits, and alternatives were explained to the patient and informed written consent was obtained. The right upper extremity was prepped with chlorhexidine in Clovis Warwick sterile fashion, and Sameerah Nachtigal sterile drape was applied covering the operative field. Maximum barrier sterile technique with sterile gowns and gloves were used for the procedure. Merari Pion timeout was performed prior to the initiation of the procedure. Local anesthesia was provided with 1% lidocaine. After the overlying soft tissues were anesthetized with 1% lidocaine, Hani Campusano micropuncture kit was utilized to access the right brachial vein. Real-time ultrasound  guidance was utilized for vascular access including the acquisition of Lamiah Marmol permanent ultrasound image documenting patency of the accessed vessel. Shelisa Fern guidewire was advanced to the level of the superior caval-atrial junction for measurement purposes and the PICC line was cut to length. Albertia Carvin peel-away sheath was placed and Brinnley Lacap 35 cm, 5 [place], dual lumen was inserted however I could not advance the catheter past the axilla. Venogram was done via the sheath which revealed possible extravasation. Further attempts at placement of the PICC line on the right arm were aborted and the sheath was removed. Pressure was held and Gussie Towson dressing was placed and I moved to the left arm. The left upper extremity was prepped with chlorhexidine in Anshul Meddings sterile fashion, and Makinsley Schiavi sterile drape was applied covering the operative field. Maximum barrier sterile technique  with sterile gowns and gloves were used for the procedure. Ersa Delaney timeout was performed prior to the initiation of the procedure. Local anesthesia was provided with 1% lidocaine. After the overlying soft tissues were anesthetized with 1% lidocaine, Delanna Blacketer micropuncture kit was utilized to access the left brachial vein. Real-time ultrasound guidance was utilized for vascular access including the acquisition of Khari Lett permanent ultrasound image documenting patency of the accessed vessel. I attempted to advance the guidewire however was unsuccessful. Ultrasound of the accessed vein revealed vasospasm. Given the difficulty I was having placing the PICC line I requested Dr. Corliss Blacker assistance. He was able to successfully access the brachial vein. The guidewire was advanced to the level of the superior caval atrial junction. He placed the PICC line over the wire and was able to advance Tallie Hevia 35 cm, 5 [place], dual lumen to level of the superior caval-atrial junction. Bijon Mineer post procedure spot fluoroscopic was obtained. The catheter easily aspirated and flushed and was secured in place. Mirella Gueye dressing was placed. The patient  tolerated the procedure well without immediate post procedural complication. FINDINGS: After catheter placement, the tip lies within the superior cavoatrial junction. The catheter aspirates and flushes normally and is ready for immediate use. The procedure was difficult secondary to the patient having small veins and vasospasm. Recommend all future attempts at [facility] line placement be in the left arm and done in interventional radiology. IMPRESSION: Successful ultrasound and fluoroscopic guided placement of Daevon Holdren left brachial vein approach, 35 cm, 5 French, dual lumen PICC with tip at the superior caval-atrial junction. The PICC line is ready for immediate use. Procedure performed by: Gareth Eagle, PA-C and Dr. Albin Felling Electronically Signed   By: Albin Felling M.D.   On: 10/19/2021 14:30        Scheduled Meds:  benzonatate  200 mg Oral TID   Chlorhexidine Gluconate Cloth  6 each Topical Q0600   enoxaparin (LOVENOX) injection  80 mg Subcutaneous Q12H   ferrous gluconate  324 mg Oral Q breakfast   gabapentin  300 mg Oral TID   pantoprazole  40 mg Oral Daily   pregabalin  75 mg Oral TID   Continuous Infusions:  sodium chloride Stopped (10/17/21 1525)   micafungin (MYCAMINE) 150 mg in sodium chloride 0.9 % 100 mL IVPB 150 mg (10/19/21 1901)   vancomycin 1,250 mg (10/20/21 1001)     LOS: 9 days    Time spent: over 30 min    Fayrene Helper, MD [facility]   To contact the attending provider between 7A-7P or the covering provider during after hours 7P-7A, please log into the web site [email or web address] and access using universal [place] password for that web site. If you do not have the password, please call the hospital operator.  10/20/2021, 10:42 AM

## 2021-10-21 DIAGNOSIS — I2602 Saddle embolus of pulmonary artery with acute cor pulmonale: Secondary | ICD-10-CM | POA: Diagnosis not present

## 2021-10-21 LAB — TYPE AND SCREEN
ABO/RH(D): A POS
Antibody Screen: NEGATIVE

## 2021-10-21 LAB — COMPREHENSIVE METABOLIC PANEL
ALT: 7 U/L (ref 0–44)
AST: 10 U/L — ABNORMAL LOW (ref 15–41)
Albumin: 2 g/dL — ABNORMAL LOW (ref 3.5–5.0)
Alkaline Phosphatase: 52 U/L (ref 38–126)
Anion gap: 8 (ref 5–15)
BUN: 5 mg/dL — ABNORMAL LOW (ref 6–20)
CO2: 25 mmol/L (ref 22–32)
Calcium: 7.8 mg/dL — ABNORMAL LOW (ref 8.9–10.3)
Chloride: 102 mmol/L (ref 98–111)
Creatinine, Ser: 0.61 mg/dL (ref 0.44–1.00)
GFR, Estimated: >60 mL/min (ref >60)
Glucose, Bld: 141 mg/dL — ABNORMAL HIGH (ref 70–99)
Potassium: 3.8 mmol/L (ref 3.5–5.1)
Sodium: 135 mmol/L (ref 135–145)
Total Bilirubin: 0.4 mg/dL (ref 0.3–1.2)
Total Protein: 6.9 g/dL (ref 6.5–8.1)

## 2021-10-21 LAB — CBC WITH DIFFERENTIAL/PLATELET
Abs Immature Granulocytes: 0.12 10*3/uL — ABNORMAL HIGH (ref 0.00–0.07)
Basophils Absolute: 0 10*3/uL (ref 0.0–0.1)
Basophils Relative: 0 %
Eosinophils Absolute: 0.3 10*3/uL (ref 0.0–0.5)
Eosinophils Relative: 3 %
HCT: 25.7 % — ABNORMAL LOW (ref 36.0–46.0)
Hemoglobin: 7.3 g/dL — ABNORMAL LOW (ref 12.0–15.0)
Immature Granulocytes: 1 %
Lymphocytes Relative: 19 %
Lymphs Abs: 2.4 10*3/uL (ref 0.7–4.0)
MCH: 23 pg — ABNORMAL LOW (ref 26.0–34.0)
MCHC: 28.4 g/dL — ABNORMAL LOW (ref 30.0–36.0)
MCV: 80.8 fL (ref 80.0–100.0)
Monocytes Absolute: 0.8 10*3/uL (ref 0.1–1.0)
Monocytes Relative: 6 %
Neutro Abs: 8.8 10*3/uL — ABNORMAL HIGH (ref 1.7–7.7)
Neutrophils Relative %: 71 %
Platelets: 323 10*3/uL (ref 150–400)
RBC: 3.18 MIL/uL — ABNORMAL LOW (ref 3.87–5.11)
RDW: 22.3 % — ABNORMAL HIGH (ref 11.5–15.5)
WBC: 12.4 10*3/uL — ABNORMAL HIGH (ref 4.0–10.5)
nRBC: 0.2 % (ref 0.0–0.2)

## 2021-10-21 LAB — HEMOGLOBIN AND HEMATOCRIT, BLOOD
HCT: 26.6 % — ABNORMAL LOW (ref 36.0–46.0)
Hemoglobin: 7.8 g/dL — ABNORMAL LOW (ref 12.0–15.0)

## 2021-10-21 LAB — MAGNESIUM: Magnesium: 2.1 mg/dL (ref 1.7–2.4)

## 2021-10-21 LAB — PHOSPHORUS: Phosphorus: 3.3 mg/dL (ref 2.5–4.6)

## 2021-10-21 NOTE — Assessment & Plan Note (Signed)
Will d/c lyrica and continue gabapentin Continue percocet, morphine, toradol prn for acute pain

## 2021-10-21 NOTE — Progress Notes (Signed)
PROGRESS NOTE    Latoya [name]  WUJ:[id number] DOB: 1985/05/16 DOA: 10/11/2021 PCP: Pcp, No  No chief complaint on file.   Brief Narrative:  Latoya [name] is Latoya [name] 36 y.o. female with Latoya [name] history of MSSA tricuspid valve endocarditis, MRSA L4-L5 facet  join septic discitis in 07/2019 and more recently diagnosis of MRSA bacteremia with infective endocarditis and tricuspid valve vegetation diagnosed 07/2021. Patient presented from [facility] for saddle pulmonary embolism. Blood cultures growing candida albicans. Patient started on micafungin. PICC placement deferred and is pending negative repeat blood cultures. Cardiothoracic surgery considering PFO closure/angio Vac for removal of TV vegetation.      Assessment & Plan:   Principal Problem:   Acute saddle pulmonary embolism (HCC) Active Problems:   Candidemia (HCC)   Endocarditis of tricuspid valve   Lumbar discitis   Pneumonia of left lower lobe due to infectious organism   MRSA bacteremia   IV drug abuse (HCC)   Hyponatremia   Chronic pain   Thrombocytopenia (HCC)   Pleuritic chest pain   Assessment and Plan: * Acute saddle pulmonary embolism (HCC) Noted on CTA chest. Acute on chronic diagnosis while on Eliquis PO. Started on Heparin IV. RV heart strain noted on imaging. Transthoracic Echocardiogram obtained which showed stable RV dysfunction from prior. Complicated by TV endocarditis with history of septic emboli. PCCM consulted with recommendations for continued anticoagulation and no thrombectomy. On room air. Hematology recommending Lovenox for treatment. Heparin not therapeutic. No procedures anticipated in the near future. Transitioned to Lovenox on 6/10. -Continue Lovenox (she's more agreeable with this now) -unchanged chest discomfort since admission related to VTE, follow (additional w/u as indicated) -telemetry monitoring  Candidemia (HCC) New diagnosis with blood culture (6/7) resulting yeast. Patient started on  micafungin IV. Leukocytosis fluctuating, follow. -Continue micafungin IV -Follow-up repeat blood cultures (6/10) NGx5 -PICC placed by IR 6/15  Endocarditis of tricuspid valve Previously diagnosed. Initial plan for angio vac which was canceled secondary to PFO. Patient was treated for this as an inpatient and discharged on linezolid. Vancomycin and Cefepime restarted on admission. ID consulted. 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  In addition, MRI lumbar spine with evidence of discitis. - fever 6/14, follow -ID recommendations: continue vancomycin and micafungin (fluconazole/linezolid while awaiting access).  She's completed cefepime for possible pneumonia. -TCTS consulted on 6/8 for recommendations on persistent large TV vegetation. Plan to consider occlusion of her PFO for consideration of angio VAC debridement (Dr. Cliffton Asters currently out of town, will follow up - see 6/9 note)  Lumbar discitis Noted on imaging. ID consulted. Neurosurgery curbsided. Recommendations for IR aspiration/biopsy. IR unable to perform secondary to no target for aspiration/biopsy. In setting of persistent fevers concerning for persistent bacteremia. -See problem, Endocarditis of tricuspid valve  Pneumonia of left lower lobe due to infectious organism Patient with Latoya [name] history of septic emboli on prior admission. Patient noted to have evidence of LLL pneumonia. Started on antibiotics as mentioned in problem, Endocarditis of tricuspid valve. Cefepime completed per ID  MRSA bacteremia -See problem, Endocarditis of tricuspid valve  IV drug abuse (HCC) History of. Patient reports continued abstinence since March of 2023. Patient reports not requiring resources for continued abstinence.  Thrombocytopenia (HCC) resolved  Chronic pain Will d/c lyrica and continue gabapentin Continue percocet, morphine, toradol prn for acute pain  Hyponatremia Mild. Possibly in  setting of acute illness/pain. Back to wnl.  Pleuritic chest pain Secondary to PE and persistent coughing. -continue Tussionex PRN -limit IV  meds -Continue tessalon perles       DVT prophylaxis: lovenox Code Status: full Family Communication: father, children at bedside Disposition:   Status is: Inpatient Remains inpatient appropriate because: need for abx, ID, CT surgery c/s   Consultants:  ID CT surgery PCCM IR  Procedures:  Picc placement by IR  Antimicrobials:  Anti-infectives (From admission, onward)    Start     Dose/Rate Route Frequency Ordered Stop   10/19/21 2200  vancomycin (VANCOREADY) IVPB 1250 mg/250 mL        1,250 mg 166.7 mL/hr over 90 Minutes Intravenous Every 12 hours 10/19/21 1341     10/18/21 2200  linezolid (ZYVOX) tablet 600 mg  Status:  Discontinued        600 mg Oral Every 12 hours 10/18/21 1600 10/19/21 1341   10/18/21 1700  fluconazole (DIFLUCAN) tablet 800 mg        800 mg Oral  Once 10/18/21 1600 10/18/21 1657   10/17/21 2200  vancomycin (VANCOREADY) IVPB 1250 mg/250 mL  Status:  Discontinued        1,250 mg 166.7 mL/hr over 90 Minutes Intravenous Every 12 hours 10/17/21 1144 10/18/21 1600   10/17/21 1600  micafungin (MYCAMINE) 150 mg in sodium chloride 0.9 % 100 mL IVPB       See Hyperspace for full Linked Orders Report.   150 mg 107.5 mL/hr over 1 Hours Intravenous Every 24 hours 10/14/21 1115     10/14/21 1600  micafungin (MYCAMINE) 150 mg in sodium chloride 0.9 % 100 mL IVPB       See Hyperspace for full Linked Orders Report.   150 mg 107.5 mL/hr over 1 Hours Intravenous Every 24 hours 10/14/21 1115 10/16/21 1746   10/13/21 2200  vancomycin (VANCOREADY) IVPB 1500 mg/300 mL  Status:  Discontinued        1,500 mg 150 mL/hr over 120 Minutes Intravenous Every 12 hours 10/13/21 1321 10/17/21 1144   10/12/21 1700  micafungin (MYCAMINE) 150 mg in sodium chloride 0.9 % 100 mL IVPB  Status:  Discontinued        150 mg 115 mL/hr over 1  Hours Intravenous Every 24 hours 10/12/21 1601 10/14/21 1115   10/11/21 2200  vancomycin (VANCOREADY) IVPB 1250 mg/250 mL  Status:  Discontinued        1,250 mg 166.7 mL/hr over 90 Minutes Intravenous Every 12 hours 10/11/21 0903 10/13/21 1321   10/11/21 1000  linezolid (ZYVOX) tablet 600 mg  Status:  Discontinued        600 mg Oral 2 times daily 10/11/21 0149 10/11/21 0813   10/11/21 1000  ceFEPIme (MAXIPIME) 2 g in sodium chloride 0.9 % 100 mL IVPB  Status:  Discontinued        2 g 200 mL/hr over 30 Minutes Intravenous Every 8 hours 10/11/21 0901 10/16/21 0938   10/11/21 1000  vancomycin (VANCOREADY) IVPB 1750 mg/350 mL        1,750 mg 175 mL/hr over 120 Minutes Intravenous  Once 10/11/21 0901 10/11/21 1242       Subjective: Asking if pain meds can be increased  Objective: Vitals:   10/20/21 0808 10/20/21 2035 10/21/21 0541 10/21/21 1502  BP: (!) 140/91 (!) 144/103 (!) 149/85 (!) 135/98  Pulse: (!) 105 (!) 103 99 97  Resp: 17 18 18 20   Temp: 98 F (36.7 C) 97.9 F (36.6 C) 98.8 F (37.1 C) 99 F (37.2 C)  TempSrc: Oral Axillary Oral Oral  SpO2:  96% 98%  97%  Weight:   88.5 kg   Height:        Intake/Output Summary (Last 24 hours) at 10/21/2021 1632 Last data filed at 10/21/2021 0600 Gross per 24 hour  Intake 2023.87 ml  Output --  Net 2023.87 ml   Filed Weights   10/11/21 0023 10/21/21 0541  Weight: 82.6 kg 88.5 kg    Examination:  General: No acute distress. Standing at edge of bed, drying hair Lungs: unlabored Neurological: Alert and oriented 3. Moves all extremities 4 with equal strength. Cranial nerves II through XII grossly intact. Skin: Warm and dry. No rashes or lesions. Extremities: No clubbing or cyanosis. No edema  Data Reviewed: I have personally reviewed following labs and imaging studies  CBC: Recent Labs  Lab 10/17/21 0149 10/18/21 0052 10/19/21 0202 10/20/21 0557 10/21/21 0526 10/21/21 1522  WBC 16.0* 18.6* 15.7* 16.4* 12.4*  --    NEUTROABS  --   --  11.3* 11.6* 8.8*  --   HGB 9.5* 8.5* 8.6* 8.0* 7.3* 7.8*  HCT 32.8* 28.8* 29.0* 27.7* 25.7* 26.6*  MCV 79.8* 78.5* 78.8* 80.8 80.8  --   PLT 334 368 384 397 323  --     Basic Metabolic Panel: Recent Labs  Lab 10/17/21 0149 10/18/21 0052 10/19/21 0202 10/20/21 0557 10/21/21 0526  NA 137 134* 135 133* 135  K 4.1 3.5 3.4* 4.1 3.8  CL 107 105 101 99 102  CO2 20* 23 22 25 25   GLUCOSE 116* 121* 112* 125* 141*  BUN 7 6 <5* 5* 5*  CREATININE 0.66 0.60 0.56 0.61 0.61  CALCIUM 8.4* 7.7* 8.2* 8.1* 7.8*  MG  --   --  1.9 2.0 2.1  PHOS  --   --  3.4 3.8 3.3    GFR: Estimated Creatinine Clearance: 111.1 mL/min (by C-G formula based on SCr of 0.61 mg/dL).  Liver Function Tests: Recent Labs  Lab 10/19/21 0202 10/20/21 0557 10/21/21 0526  AST 13* 12* 10*  ALT 7 8 7   ALKPHOS 62 57 52  BILITOT 0.6 0.6 0.4  PROT 7.6 7.3 6.9  ALBUMIN 2.3* 2.1* 2.0*    CBG: No results for input(s): "GLUCAP" in the last 168 hours.   Recent Results (from the past 240 hour(s))  Expectorated Sputum Assessment w Gram Stain, Rflx to Resp Cult     Status: None   Collection Time: 10/13/21  6:11 AM   Specimen: Expectorated Sputum  Result Value Ref Range Status   Specimen Description EXPECTORATED SPUTUM  Final   Special Requests NONE  Final   Sputum evaluation   Final    THIS SPECIMEN IS ACCEPTABLE FOR SPUTUM CULTURE Performed at [facility], [address] [place], [address] [place]    Report Status 10/17/2021 FINAL  Final  Culture, Respiratory w Gram Stain     Status: None   Collection Time: 10/13/21  6:11 AM  Result Value Ref Range Status   Specimen Description EXPECTORATED SPUTUM  Final   Special Requests NONE Reflexed from 10/19/2021  Final   Gram Stain   Final    FEW SQUAMOUS EPITHELIAL CELLS PRESENT RARE WBC PRESENT, PREDOMINANTLY MONONUCLEAR MODERATE GRAM NEGATIVE RODS FEW GRAM POSITIVE COCCI IN PAIRS    Culture   Final    FEW Normal respiratory flora-no Staph  aureus or Pseudomonas seen Performed at [facility], [address], [place], [address] [place]    Report Status 10/16/2021 FINAL  Final  Culture, blood (Routine X 2)  w Reflex to ID Panel     Status: None   Collection Time: 10/14/21  1:43 PM   Specimen: BLOOD LEFT ARM  Result Value Ref Range Status   Specimen Description BLOOD LEFT ARM  Final   Special Requests   Final    BOTTLES DRAWN AEROBIC AND ANAEROBIC Blood Culture results may not be optimal due to an inadequate volume of blood received in culture bottles   Culture   Final    NO GROWTH 5 DAYS Performed at [facility], [address] [place], [place] [address]    Report Status 10/19/2021 FINAL  Final  Culture, blood (Routine X 2) w Reflex to ID Panel     Status: None   Collection Time: 10/14/21  1:44 PM   Specimen: BLOOD LEFT HAND  Result Value Ref Range Status   Specimen Description BLOOD LEFT HAND  Final   Special Requests   Final    BOTTLES DRAWN AEROBIC ONLY Blood Culture results may not be optimal due to an inadequate volume of blood received in culture bottles   Culture   Final    NO GROWTH 5 DAYS Performed at [facility], [address], [place], [place] [address]    Report Status 10/19/2021 FINAL  Final         Radiology Studies: No results found.      Scheduled Meds:  benzonatate  200 mg Oral TID   Chlorhexidine Gluconate Cloth  6 each Topical Q0600   enoxaparin (LOVENOX) injection  80 mg Subcutaneous Q12H   ferrous gluconate  324 mg Oral Q breakfast   gabapentin  300 mg Oral TID   pantoprazole  40 mg Oral Daily   Continuous Infusions:  sodium chloride Stopped (10/17/21 1525)   micafungin (MYCAMINE) 150 mg in sodium chloride 0.9 % 100 mL IVPB 108 mL/hr at 10/20/21 1802   vancomycin 1,250 mg (10/21/21 1034)     LOS: 10 days    Time spent: over 30 min    Lacretia Nicks, MD [facility]   To contact the attending provider between 7A-7P or the covering provider  during after hours 7P-7A, please log into the web site [email or web address] and access using universal [place] password for that web site. If you do not have the password, please call the hospital operator.  10/21/2021, 4:32 PM

## 2021-10-22 ENCOUNTER — Inpatient Hospital Stay (HOSPITAL_COMMUNITY): Payer: Medicaid Other

## 2021-10-22 DIAGNOSIS — R509 Fever, unspecified: Secondary | ICD-10-CM | POA: Diagnosis not present

## 2021-10-22 DIAGNOSIS — I2602 Saddle embolus of pulmonary artery with acute cor pulmonale: Secondary | ICD-10-CM | POA: Diagnosis not present

## 2021-10-22 LAB — URINALYSIS, COMPLETE (UACMP) WITH MICROSCOPIC
Bacteria, UA: NONE SEEN
Bilirubin Urine: NEGATIVE
Glucose, UA: NEGATIVE mg/dL
Ketones, ur: NEGATIVE mg/dL
Leukocytes,Ua: NEGATIVE
Nitrite: NEGATIVE
Protein, ur: NEGATIVE mg/dL
Specific Gravity, Urine: 1.004 — ABNORMAL LOW (ref 1.005–1.030)
pH: 6 (ref 5.0–8.0)

## 2021-10-22 LAB — CBC
HCT: 27.7 % — ABNORMAL LOW (ref 36.0–46.0)
Hemoglobin: 7.9 g/dL — ABNORMAL LOW (ref 12.0–15.0)
MCH: 23.2 pg — ABNORMAL LOW (ref 26.0–34.0)
MCHC: 28.5 g/dL — ABNORMAL LOW (ref 30.0–36.0)
MCV: 81.2 fL (ref 80.0–100.0)
Platelets: 347 10*3/uL (ref 150–400)
RBC: 3.41 MIL/uL — ABNORMAL LOW (ref 3.87–5.11)
RDW: 22.7 % — ABNORMAL HIGH (ref 11.5–15.5)
WBC: 14.2 10*3/uL — ABNORMAL HIGH (ref 4.0–10.5)
nRBC: 0.1 % (ref 0.0–0.2)

## 2021-10-22 IMAGING — DX DG CHEST 1V PORT
1 series · 1 of 1 positions shown · non-contrast
Comparison: Chest radiograph dated [DATE] and CT dated
[DATE].

CLINICAL DATA: Fever.

EXAM:
PORTABLE CHEST 1 VIEW

[chest ap]
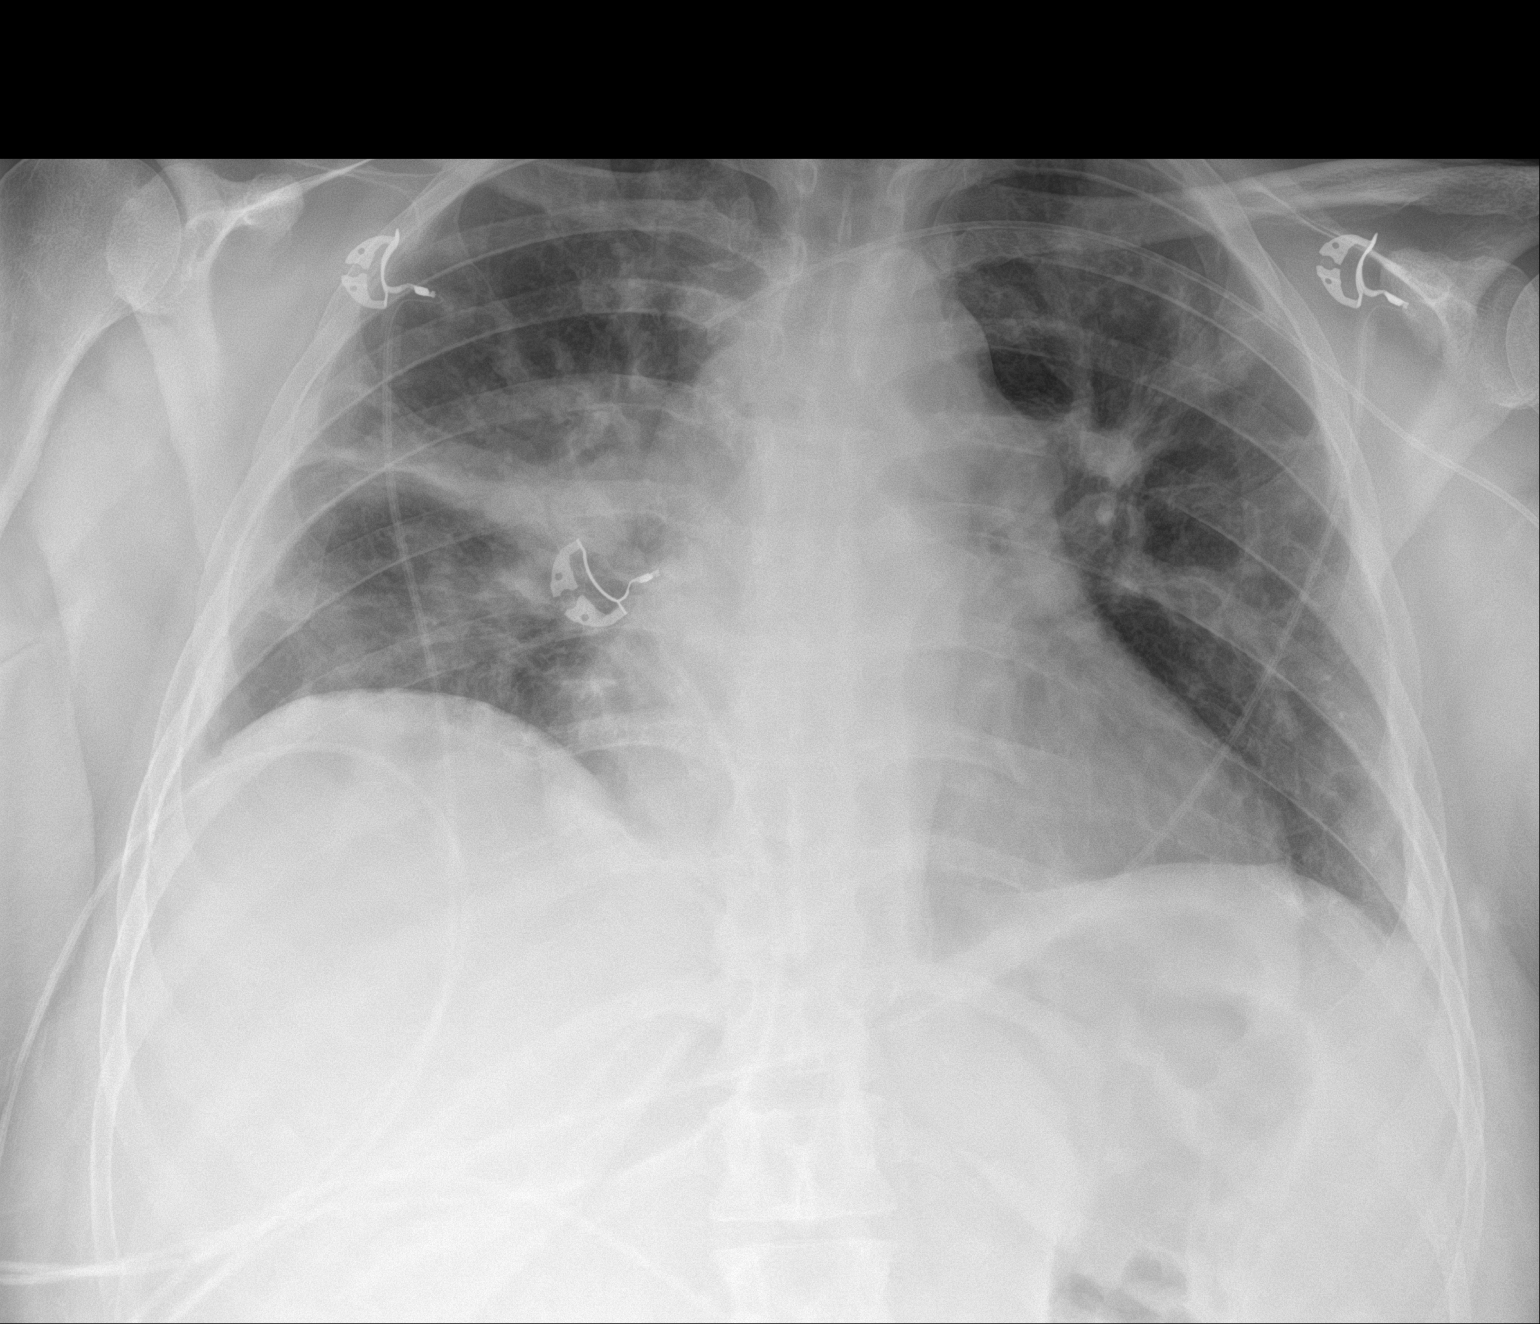

[1 of 1 positions shown; findings below may reference images not displayed]

FINDINGS: Left-sided PICC with tip at the junction of the innominate and SVC.
Bilateral streaky and confluent opacities, progressed since the
radiograph of [DATE]. No pleural effusion pneumothorax. Mild
cardiomegaly. No acute osseous pathology.
IMPRESSION: Bilateral streaky and confluent opacities, progressed since the
radiograph of [DATE]. Findings may represent pneumonia, edema or
aspiration.

## 2021-10-22 MED ORDER — BENZONATATE 100 MG PO CAPS
200.0000 mg | ORAL_CAPSULE | Freq: Three times a day (TID) | ORAL | Status: DC | PRN
  Administered 2021-10-22 – 2021-10-31 (×12): 200 mg via ORAL
  Filled 2021-10-22 (×12): qty 2

## 2021-10-22 MED ORDER — GABAPENTIN 400 MG PO CAPS
400.0000 mg | ORAL_CAPSULE | Freq: Three times a day (TID) | ORAL | Status: DC
  Administered 2021-10-22 – 2021-11-26 (×105): 400 mg via ORAL
  Filled 2021-10-22 (×105): qty 1

## 2021-10-22 NOTE — Progress Notes (Signed)
Pharmacy Antibiotic Note  Latoya [name] is a 36 y.o. female admitted on 10/11/2021 with CP and back pain. Pt noted to have acute PE. Pt has significant history of MSSA and MRSA bacteremia c/b endocarditis and lumbar discitis. Pt recently completed 6 weeks of vancomycin and was started on linezolid after. Pharmacy has been consulted for vancomycin. Micafungin ordered as well with C. Albicans growing in blood. ID following.   PICC line replaced 6/15 and vancomycin was resumed. SCr has remained stable at ~0.6. WBC persistently elevated, today 14.2. Cardiothoracic surgery considering PFO closure/angio Vac for removal of TV vegetation  Plan: -Contine vancomycin to 1250mg  IV q12h -Will follow renal function and clinical progress   Height: 5\' 6"  (167.6 cm) Weight: 88.5 kg (195 lb 1.6 oz) IBW/kg (Calculated) : 59.3  Temp (24hrs), Avg:99 F (37.2 C), Min:98.5 F (36.9 C), Max:99.5 F (37.5 C)  Recent Labs  Lab 10/17/21 0149 10/17/21 0904 10/18/21 0052 10/19/21 0202 10/20/21 0557 10/21/21 0526 10/22/21 0602  WBC 16.0*  --  18.6* 15.7* 16.4* 12.4* 14.2*  CREATININE 0.66  --  0.60 0.56 0.61 0.61  --   VANCOTROUGH  --  14*  --   --   --   --   --   VANCOPEAK 40  --   --   --   --   --   --      Estimated Creatinine Clearance: 111.1 mL/min (by C-G formula based on SCr of 0.61 mg/dL).    Allergies  Allergen Reactions   Naltrexone Anxiety    Severe anxiety Restlessness  Vomiting     Antimicrobials this admission: Vancomycin 6/7 >>  Cefepime 6/7 >> 6/12 Micafungin 6/8 >>  Microbiology results: 6/7 BCx: C. Albicans 6/7 MRSA PCR: negative 6/9 resp- normal flora 6/10 blood x2- neg  Thank you for allowing pharmacy to be a part of this patient's care.  8/7, Pharm.D. PGY-1 Pharmacy Resident Phone:[phone] 10/22/2021 8:08 AM

## 2021-10-22 NOTE — Progress Notes (Signed)
   10/22/21 1919  Assess: MEWS Score  Temp (!) 101.7 F (38.7 C)  BP (!) 120/96  MAP (mmHg) 100  Pulse Rate (!) 110  ECG Heart Rate (!) 107  Resp 20  Level of Consciousness Alert  SpO2 97 %  O2 Device Room Air  Assess: MEWS Score  MEWS Temp 2  MEWS Systolic 0  MEWS Pulse 1  MEWS RR 0  MEWS LOC 0  MEWS Score 3  MEWS Score Color Yellow  Assess: if the MEWS score is Yellow or Red  Were vital signs taken at a resting state? Yes  Focused Assessment Change from prior assessment (see assessment flowsheet)  Does the patient meet 2 or more of the SIRS criteria? Yes  Does the patient have a confirmed or suspected source of infection? Yes  Provider and Rapid Response Notified?  (Provider-yes; Rapid-no)  MEWS guidelines implemented *See Row Information* Yes  Treat  MEWS Interventions Escalated (See documentation below)  Take Vital Signs  Increase Vital Sign Frequency  Yellow: Q 2hr X 2 then Q 4hr X 2, if remains yellow, continue Q 4hrs  Escalate  MEWS: Escalate Yellow: discuss with charge nurse/RN and consider discussing with provider and RRT  Notify: Charge Nurse/RN  Name of Charge Nurse/RN Notified Pippa, RN  Date Charge Nurse/RN Notified 10/22/21  Time Charge Nurse/RN Notified 1930  Notify: Provider  Provider Name/Title Newton Pigg, DO  Date Provider Notified 10/22/21  Time Provider Notified 1930  Method of Notification  (dayshift RN sent message through secure chat)  Notification Reason Change in status  Provider response See new orders  Date of Provider Response 10/22/21  Time of Provider Response 1935  Document  Patient Outcome Stabilized after interventions  Progress note created (see row info) Yes  Assess: SIRS CRITERIA  SIRS Temperature  1  SIRS Pulse 1  SIRS Respirations  0  SIRS WBC 0  SIRS Score Sum  2

## 2021-10-22 NOTE — Progress Notes (Signed)
PROGRESS NOTE    Latoya [name]  UQJ:[id number] DOB: [birth year] DOA: 10/11/2021 PCP: Pcp, No  No chief complaint on file.   Brief Narrative:  Latoya [name] is Latoya [name] 36 y.o. female with [name] history of MSSA tricuspid valve endocarditis, MRSA L4-L5 facet  join septic discitis in 07/2019 and more recently diagnosis of MRSA bacteremia with infective endocarditis and tricuspid valve vegetation diagnosed 07/2021. Patient presented from [facility] for saddle pulmonary embolism. Blood cultures growing candida albicans. Patient started on micafungin. PICC placement deferred and is pending negative repeat blood cultures. Cardiothoracic surgery considering PFO closure/angio Vac for removal of TV vegetation.      Assessment & Plan:   Principal Problem:   Acute saddle pulmonary embolism (HCC) Active Problems:   Candidemia (HCC)   Endocarditis of tricuspid valve   Lumbar discitis   Pneumonia of left lower lobe due to infectious organism   MRSA bacteremia   IV drug abuse (HCC)   Hyponatremia   Chronic pain   Thrombocytopenia (HCC)   Pleuritic chest pain   Assessment and Plan: * Acute saddle pulmonary embolism (HCC) Noted on CTA chest. Acute on chronic diagnosis while on Eliquis PO. Started on Heparin IV. RV heart strain noted on imaging. Transthoracic Echocardiogram obtained which showed stable RV dysfunction from prior. Complicated by TV endocarditis with history of septic emboli. PCCM consulted with recommendations for continued anticoagulation and no thrombectomy. On room air. Hematology recommending Lovenox for treatment. Heparin not therapeutic. No procedures anticipated in the near future. Transitioned to Lovenox on 6/10. -Continue Lovenox (she's more agreeable with this now) -unchanged chest discomfort since admission related to VTE, follow (additional w/u as indicated) -telemetry monitoring  Candidemia (HCC) New diagnosis with blood culture (6/7) resulting yeast. Patient started on  micafungin IV. Leukocytosis fluctuating, follow. -Continue micafungin IV -Follow-up repeat blood cultures (6/10) NGx5 -PICC placed by IR 6/15  Endocarditis of tricuspid valve Previously diagnosed. Initial plan for angio vac which was canceled secondary to PFO. Patient was treated for this as an inpatient and discharged on linezolid. Vancomycin and Cefepime restarted on admission. ID consulted. 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  In addition, MRI lumbar spine with evidence of discitis. - fever 6/14, follow - leukocytosis fluctuating -ID recommendations: continue vancomycin and micafungin (fluconazole/linezolid while awaiting access).  She's completed cefepime for possible pneumonia. -TCTS consulted on 6/8 for recommendations on persistent large TV vegetation. Plan to consider occlusion of her PFO for consideration of angio VAC debridement (Dr. Cliffton Asters currently out of town, will follow up - see 6/9 note)  Lumbar discitis Noted on imaging. ID consulted. Neurosurgery curbsided. Recommendations for IR aspiration/biopsy. IR unable to perform secondary to no target for aspiration/biopsy. In setting of persistent fevers concerning for persistent bacteremia. -See problem, Endocarditis of tricuspid valve  Pneumonia of left lower lobe due to infectious organism Patient with Latoya [name] history of septic emboli on prior admission. Patient noted to have evidence of LLL pneumonia. Started on antibiotics as mentioned in problem, Endocarditis of tricuspid valve. Cefepime completed per ID  MRSA bacteremia -See problem, Endocarditis of tricuspid valve  IV drug abuse (HCC) History of. Patient reports continued abstinence since March of 2023. Patient reports not requiring resources for continued abstinence.  Thrombocytopenia (HCC) resolved  Chronic pain Will d/c lyrica and continue gabapentin Continue percocet, morphine, toradol prn for acute  pain  Hyponatremia Mild. Possibly in setting of acute illness/pain. Back to wnl.  Pleuritic chest pain Secondary to PE and persistent coughing. -continue Tussionex  PRN -limit IV meds -Continue tessalon perles       DVT prophylaxis: lovenox Code Status: full Family Communication: father, children at bedside Disposition:   Status is: Inpatient Remains inpatient appropriate because: need for abx, ID, CT surgery c/s   Consultants:  ID CT surgery PCCM IR  Procedures:  Picc placement by IR  Antimicrobials:  Anti-infectives (From admission, onward)    Start     Dose/Rate Route Frequency Ordered Stop   10/19/21 2200  vancomycin (VANCOREADY) IVPB 1250 mg/250 mL        1,250 mg 166.7 mL/hr over 90 Minutes Intravenous Every 12 hours 10/19/21 1341     10/18/21 2200  linezolid (ZYVOX) tablet 600 mg  Status:  Discontinued        600 mg Oral Every 12 hours 10/18/21 1600 10/19/21 1341   10/18/21 1700  fluconazole (DIFLUCAN) tablet 800 mg        800 mg Oral  Once 10/18/21 1600 10/18/21 1657   10/17/21 2200  vancomycin (VANCOREADY) IVPB 1250 mg/250 mL  Status:  Discontinued        1,250 mg 166.7 mL/hr over 90 Minutes Intravenous Every 12 hours 10/17/21 1144 10/18/21 1600   10/17/21 1600  micafungin (MYCAMINE) 150 mg in sodium chloride 0.9 % 100 mL IVPB       See Hyperspace for full Linked Orders Report.   150 mg 107.5 mL/hr over 1 Hours Intravenous Every 24 hours 10/14/21 1115     10/14/21 1600  micafungin (MYCAMINE) 150 mg in sodium chloride 0.9 % 100 mL IVPB       See Hyperspace for full Linked Orders Report.   150 mg 107.5 mL/hr over 1 Hours Intravenous Every 24 hours 10/14/21 1115 10/16/21 1746   10/13/21 2200  vancomycin (VANCOREADY) IVPB 1500 mg/300 mL  Status:  Discontinued        1,500 mg 150 mL/hr over 120 Minutes Intravenous Every 12 hours 10/13/21 1321 10/17/21 1144   10/12/21 1700  micafungin (MYCAMINE) 150 mg in sodium chloride 0.9 % 100 mL IVPB  Status:   Discontinued        150 mg 115 mL/hr over 1 Hours Intravenous Every 24 hours 10/12/21 1601 10/14/21 1115   10/11/21 2200  vancomycin (VANCOREADY) IVPB 1250 mg/250 mL  Status:  Discontinued        1,250 mg 166.7 mL/hr over 90 Minutes Intravenous Every 12 hours 10/11/21 0903 10/13/21 1321   10/11/21 1000  linezolid (ZYVOX) tablet 600 mg  Status:  Discontinued        600 mg Oral 2 times daily 10/11/21 0149 10/11/21 0813   10/11/21 1000  ceFEPIme (MAXIPIME) 2 g in sodium chloride 0.9 % 100 mL IVPB  Status:  Discontinued        2 g 200 mL/hr over 30 Minutes Intravenous Every 8 hours 10/11/21 0901 10/16/21 0938   10/11/21 1000  vancomycin (VANCOREADY) IVPB 1750 mg/350 mL        1,750 mg 175 mL/hr over 120 Minutes Intravenous  Once 10/11/21 0901 10/11/21 1242       Subjective: No new complaints  Objective: Vitals:   10/21/21 1502 10/21/21 2027 10/22/21 0559 10/22/21 0857  BP: (!) 135/98 (!) 162/98 (!) 142/88 (!) 153/97  Pulse: 97   98  Resp: 20 20 20 20   Temp: 99 F (37.2 C) 99.5 F (37.5 C) 98.5 F (36.9 C) 98.4 F (36.9 C)  TempSrc: Oral Oral Oral Oral  SpO2: 97% 99% 97% 98%  Weight:  Height:        Intake/Output Summary (Last 24 hours) at 10/22/2021 1514 Last data filed at 10/22/2021 1025 Gross per 24 hour  Intake 985.48 ml  Output --  Net 985.48 ml   Filed Weights   10/11/21 0023 10/21/21 0541  Weight: 82.6 kg 88.5 kg    Examination:  General: No acute distress. Lying in bed Lungs: unlabored Neurological: Alert and oriented 3. Moves all extremities 4 with equal strength. Cranial nerves II through XII grossly intact. Skin: no visible rash. Psychiatric: Mood and affect are normal.    Data Reviewed: I have personally reviewed following labs and imaging studies  CBC: Recent Labs  Lab 10/18/21 0052 10/19/21 0202 10/20/21 0557 10/21/21 0526 10/21/21 1522 10/22/21 0602  WBC 18.6* 15.7* 16.4* 12.4*  --  14.2*  NEUTROABS  --  11.3* 11.6* 8.8*  --   --    HGB 8.5* 8.6* 8.0* 7.3* 7.8* 7.9*  HCT 28.8* 29.0* 27.7* 25.7* 26.6* 27.7*  MCV 78.5* 78.8* 80.8 80.8  --  81.2  PLT 368 384 397 323  --  347    Basic Metabolic Panel: Recent Labs  Lab 10/17/21 0149 10/18/21 0052 10/19/21 0202 10/20/21 0557 10/21/21 0526  NA 137 134* 135 133* 135  K 4.1 3.5 3.4* 4.1 3.8  CL 107 105 101 99 102  CO2 20* 23 22 25 25   GLUCOSE 116* 121* 112* 125* 141*  BUN 7 6 <5* 5* 5*  CREATININE 0.66 0.60 0.56 0.61 0.61  CALCIUM 8.4* 7.7* 8.2* 8.1* 7.8*  MG  --   --  1.9 2.0 2.1  PHOS  --   --  3.4 3.8 3.3    GFR: Estimated Creatinine Clearance: 111.1 mL/min (by C-G formula based on SCr of 0.61 mg/dL).  Liver Function Tests: Recent Labs  Lab 10/19/21 0202 10/20/21 0557 10/21/21 0526  AST 13* 12* 10*  ALT 7 8 7   ALKPHOS 62 57 52  BILITOT 0.6 0.6 0.4  PROT 7.6 7.3 6.9  ALBUMIN 2.3* 2.1* 2.0*    CBG: No results for input(s): "GLUCAP" in the last 168 hours.   Recent Results (from the past 240 hour(s))  Expectorated Sputum Assessment w Gram Stain, Rflx to Resp Cult     Status: None   Collection Time: 10/13/21  6:11 AM   Specimen: Expectorated Sputum  Result Value Ref Range Status   Specimen Description EXPECTORATED SPUTUM  Final   Special Requests NONE  Final   Sputum evaluation   Final    THIS SPECIMEN IS ACCEPTABLE FOR SPUTUM CULTURE Performed at [facility], [address], [place], [address] [place]    Report Status 10/17/2021 FINAL  Final  Culture, Respiratory w Gram Stain     Status: None   Collection Time: 10/13/21  6:11 AM  Result Value Ref Range Status   Specimen Description EXPECTORATED SPUTUM  Final   Special Requests NONE Reflexed from 10/19/2021  Final   Gram Stain   Final    FEW SQUAMOUS EPITHELIAL CELLS PRESENT RARE WBC PRESENT, PREDOMINANTLY MONONUCLEAR MODERATE GRAM NEGATIVE RODS FEW GRAM POSITIVE COCCI IN PAIRS    Culture   Final    FEW Normal respiratory flora-no Staph aureus or Pseudomonas seen Performed at  [facility], [address], [place], [address] [place]    Report Status 10/16/2021 FINAL  Final  Culture, blood (Routine X 2) w Reflex to ID Panel     Status: None   Collection Time: 10/14/21  1:43 PM   Specimen: BLOOD LEFT ARM  Result Value Ref Range Status   Specimen Description BLOOD LEFT ARM  Final   Special Requests   Final    BOTTLES DRAWN AEROBIC AND ANAEROBIC Blood Culture results may not be optimal due to an inadequate volume of blood received in culture bottles   Culture   Final    NO GROWTH 5 DAYS Performed at [facility], [address], [place], [place] [address]    Report Status 10/19/2021 FINAL  Final  Culture, blood (Routine X 2) w Reflex to ID Panel     Status: None   Collection Time: 10/14/21  1:44 PM   Specimen: BLOOD LEFT HAND  Result Value Ref Range Status   Specimen Description BLOOD LEFT HAND  Final   Special Requests   Final    BOTTLES DRAWN AEROBIC ONLY Blood Culture results may not be optimal due to an inadequate volume of blood received in culture bottles   Culture   Final    NO GROWTH 5 DAYS Performed at [facility], [address], [place], [place] [address]    Report Status 10/19/2021 FINAL  Final         Radiology Studies: No results found.      Scheduled Meds:  benzonatate  200 mg Oral TID   Chlorhexidine Gluconate Cloth  6 each Topical Q0600   enoxaparin (LOVENOX) injection  80 mg Subcutaneous Q12H   ferrous gluconate  324 mg Oral Q breakfast   gabapentin  400 mg Oral TID   pantoprazole  40 mg Oral Daily   Continuous Infusions:  sodium chloride Stopped (10/22/21 0936)   micafungin (MYCAMINE) 150 mg in sodium chloride 0.9 % 100 mL IVPB Stopped (10/21/21 1847)   vancomycin 166.7 mL/hr at 10/22/21 1025     LOS: 11 days    Time spent: over 30 min    Lacretia Nicks, MD [facility]   To contact the attending provider between 7A-7P or the covering provider during after hours 7P-7A, please log into  the web site [email or web address] and access using universal [place] password for that web site. If you do not have the password, please call the hospital operator.  10/22/2021, 3:14 PM

## 2021-10-22 NOTE — Progress Notes (Signed)
Patient T101.7. Dr. Arlean Hopping notified.  Orders placed.

## 2021-10-23 DIAGNOSIS — R9389 Abnormal findings on diagnostic imaging of other specified body structures: Secondary | ICD-10-CM | POA: Insufficient documentation

## 2021-10-23 DIAGNOSIS — I2602 Saddle embolus of pulmonary artery with acute cor pulmonale: Secondary | ICD-10-CM | POA: Diagnosis not present

## 2021-10-23 DIAGNOSIS — E669 Obesity, unspecified: Secondary | ICD-10-CM

## 2021-10-23 DIAGNOSIS — B49 Unspecified mycosis: Secondary | ICD-10-CM | POA: Diagnosis not present

## 2021-10-23 DIAGNOSIS — B377 Candidal sepsis: Secondary | ICD-10-CM | POA: Diagnosis not present

## 2021-10-23 HISTORY — DX: Obesity, unspecified: E66.9

## 2021-10-23 HISTORY — DX: Abnormal findings on diagnostic imaging of other specified body structures: R93.89

## 2021-10-23 LAB — BRAIN NATRIURETIC PEPTIDE: B Natriuretic Peptide: 208.9 pg/mL — ABNORMAL HIGH (ref 0.0–100.0)

## 2021-10-23 MED ORDER — OXYCODONE-ACETAMINOPHEN 5-325 MG PO TABS
1.0000 | ORAL_TABLET | Freq: Four times a day (QID) | ORAL | Status: DC | PRN
  Administered 2021-10-23 – 2021-10-25 (×5): 2 via ORAL
  Filled 2021-10-23 (×6): qty 2

## 2021-10-23 MED ORDER — ENOXAPARIN SODIUM 150 MG/ML IJ SOSY
130.0000 mg | PREFILLED_SYRINGE | INTRAMUSCULAR | Status: DC
  Filled 2021-10-23: qty 0.86

## 2021-10-23 MED ORDER — CHLORHEXIDINE GLUCONATE CLOTH 2 % EX PADS
6.0000 | MEDICATED_PAD | Freq: Every day | CUTANEOUS | Status: DC
Start: 2021-10-23 — End: 2021-11-26
  Administered 2021-10-23 – 2021-11-26 (×35): 6 via TOPICAL

## 2021-10-23 MED ORDER — ENOXAPARIN SODIUM 150 MG/ML IJ SOSY
130.0000 mg | PREFILLED_SYRINGE | INTRAMUSCULAR | Status: DC
  Administered 2021-10-23 – 2021-10-25 (×3): 130 mg via SUBCUTANEOUS
  Filled 2021-10-23 (×3): qty 0.86

## 2021-10-23 NOTE — Progress Notes (Signed)
    [facility]   Reason for visit: Follow up on TV endocarditis  Interval History: WBC 14.2, Tmax 101.7.  worsening pleuritic chest pain.   Day 14 total antibiotics    Physical Exam: Constitutional:  Vitals:   10/23/21 0745 10/23/21 1145  BP: (!) 148/95 (!) 120/91  Pulse: 100 (!) 102  Resp: (!) 26 (!) 24  Temp: 99.1 F (37.3 C) 99.3 F (37.4 C)  SpO2: 100% 98%   patient appears in NAD Respiratory: Normal respiratory effort Skin: no rashes  Review of Systems: Constitutional: negative for fevers and chills  Lab Results  Component Value Date   WBC 14.2 (H) 10/22/2021   HGB 7.9 (L) 10/22/2021   HCT 27.7 (L) 10/22/2021   MCV 81.2 10/22/2021   PLT 347 10/22/2021    Lab Results  Component Value Date   CREATININE 0.61 10/21/2021   BUN 5 (L) 10/21/2021   NA 135 10/21/2021   K 3.8 10/21/2021   CL 102 10/21/2021   CO2 25 10/21/2021    Lab Results  Component Value Date   ALT 7 10/21/2021   AST 10 (L) 10/21/2021   ALKPHOS 52 10/21/2021     Microbiology: Recent Results (from the past 240 hour(s))  Culture, blood (Routine X 2) w Reflex to ID Panel     Status: None   Collection Time: 10/14/21  1:43 PM   Specimen: BLOOD LEFT ARM  Result Value Ref Range Status   Specimen Description BLOOD LEFT ARM  Final   Special Requests   Final    BOTTLES DRAWN AEROBIC AND ANAEROBIC Blood Culture results may not be optimal due to an inadequate volume of blood received in culture bottles   Culture   Final    NO GROWTH 5 DAYS Performed at [facility], [address], [place], [place] [address]    Report Status 10/19/2021 FINAL  Final  Culture, blood (Routine X 2) w Reflex to ID Panel     Status: None   Collection Time: 10/14/21  1:44 PM   Specimen: BLOOD LEFT HAND  Result Value Ref Range Status   Specimen Description BLOOD LEFT HAND  Final   Special Requests   Final    BOTTLES DRAWN AEROBIC ONLY Blood Culture results may not be optimal due to an  inadequate volume of blood received in culture bottles   Culture   Final    NO GROWTH 5 DAYS Performed at [facility], [address], [place], [place] [address]    Report Status 10/19/2021 FINAL  Final    Impression/Plan:  1. TV endocarditis - large vegetation with severe tricuspid regurgitation.  Presumed MRSA though cultures have been negative.  She remains on vancomycin.  Plan for angiovac at some point per Dr. Cliffton Asters.    2.  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    3.  Lumbar discitis - noted on imaging and on antibiotics as above.  IR unable to aspirate for culture.

## 2021-10-23 NOTE — Progress Notes (Signed)
Pt offered CHG bath twice and has declined.

## 2021-10-23 NOTE — Progress Notes (Signed)
Pt declined Lovenox injection this AM.

## 2021-10-23 NOTE — Assessment & Plan Note (Addendum)
CXR 6/18 with bilateral streaky/confluent opacities possible Pneumonia, edema, aspiration Repeat CXR 6/20 with worsening aeration with new right effusion Follow BNP (elevated), doesn't appear overtly overloaded - hold off on lasix Will follow for now Currently on vanc/micafungin Will continue to monitor for now, consider broadening abx if recurrent fevers

## 2021-10-23 NOTE — Progress Notes (Signed)
TR[facility] night cross cover note:   I was notified by RN that the patient has developed a fever of 101.9.   Per my chart review, this is a 36 year old female who is on hospital day 12 of this admission that includes management of bacterial endocarditis on IV vancomycin, candidemia (on micafungin), acute PE.  It appears that the above elevated temperature is the first documented fever that the patient has experienced in at least the last 36 hours.  Currently normotensive, without any new symptoms.   However, given that the patient appears to have been afebrile over at least the last 36 hours, have ordered blood cultures x2, urinalysis, and chest x-ray to further evaluate.  Additionally, RN conveyed to me that the patient's order for Jerilynn Som has expired.  Subsequently placed a new order for prn Tessalon Perles for cough.    UPDATE: attempts by 3 separate phlebotomists unsuccessful in obtaining blood cultures.  Additionally, following prn acetaminophen, patient's fever has now resolved with most recent temperature noted to be 98.9. will refrain from additional attempts to collect blood cultures for now.  Urinalysis has resulted and is notable for demonstrating no white blood cells, no bacteria, and nitrite/leukocyte esterase negative findings.  Additionally, chest x-ray shows persistence of the bilateral streaky and confluent opacities observed on chest x-ray from 10/09/2021, with some interval progression thereof.   From my standpoint, no changes to existing IV antibiotics/IV antifungal regimen at present, deferring to day hospitalist and  infectious disease, who has been actively following patient during this hospitalization.    Newton Pigg, DO Hospitalist

## 2021-10-23 NOTE — Progress Notes (Signed)
Pt finally agreed and understood the importance of CHG bath. CHG bath has been given at 1847.

## 2021-10-23 NOTE — Progress Notes (Signed)
PROGRESS NOTE    Latoya [name]  LPF:[id number] DOB: 1985-07-14 DOA: 10/11/2021 PCP: Pcp, No  No chief complaint on file.   Brief Narrative:  Latoya [name] is Latoya [name] 36 y.o. female with Latoya [name] history of MSSA tricuspid valve endocarditis, MRSA L4-L5 facet  join septic discitis in 07/2019 and more recently diagnosis of MRSA bacteremia with infective endocarditis and tricuspid valve vegetation diagnosed 07/2021. Patient presented from [facility] for saddle pulmonary embolism. Blood cultures growing candida albicans. Patient started on micafungin. PICC placement deferred and is pending negative repeat blood cultures. Cardiothoracic surgery considering PFO closure/angio Vac for removal of TV vegetation.      Assessment & Plan:   Principal Problem:   Acute saddle pulmonary embolism (HCC) Active Problems:   Candidemia (HCC)   Endocarditis of tricuspid valve   Lumbar discitis   Pneumonia of left lower lobe due to infectious organism   MRSA bacteremia   IV drug abuse (HCC)   Hyponatremia   Chronic pain   Thrombocytopenia (HCC)   Pleuritic chest pain   Abnormal CXR   Obesity (BMI 30-39.9)   Assessment and Plan: * Acute saddle pulmonary embolism (HCC) Noted on CTA chest. Acute on chronic diagnosis while on Eliquis PO. Started on Heparin IV. RV heart strain noted on imaging. Transthoracic Echocardiogram obtained which showed stable RV dysfunction from prior. Complicated by TV endocarditis with history of septic emboli. PCCM consulted with recommendations for continued anticoagulation and no thrombectomy. On room air. Hematology recommending Lovenox for treatment. Heparin not therapeutic. No procedures anticipated in the near future. Transitioned to Lovenox on 6/10. -Continue Lovenox (intermittently refusing this, dose adjusted to once daily -> if she continues to refuse, will need heparin) -unchanged chest discomfort since admission related to VTE, follow (additional w/u as indicated) -telemetry  monitoring  Candidemia (HCC) New diagnosis with blood culture (6/7) resulting yeast. Patient started on micafungin IV. Leukocytosis fluctuating, follow. -Continue micafungin IV -Follow-up repeat blood cultures (6/10) NGx5 -PICC placed by IR 6/15  Endocarditis of tricuspid valve Previously diagnosed. Initial plan for angio vac which was canceled secondary to PFO. Patient was treated for this as an inpatient and discharged on linezolid. Vancomycin and Cefepime restarted on admission. ID consulted. 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  In addition, MRI lumbar spine with evidence of discitis. - fever overnight, trend  - ua not convincing of UTI, CXR with bilateral streaky/confluent opacities - leukocytosis fluctuating -ID recommendations: continue vancomycin and micafungin (fluconazole/linezolid while awaiting access).  She's completed cefepime for possible pneumonia. -TCTS consulted on 6/8 for recommendations on persistent large TV vegetation. Plan to consider occlusion of her PFO for consideration of angio VAC debridement (Dr. Cliffton Asters to follow up 6/20)  Lumbar discitis Noted on imaging. ID consulted. Neurosurgery curbsided. Recommendations for IR aspiration/biopsy. IR unable to perform secondary to no target for aspiration/biopsy. In setting of persistent fevers concerning for persistent bacteremia. -See problem, Endocarditis of tricuspid valve  Pneumonia of left lower lobe due to infectious organism Patient with Latoya [name] history of septic emboli on prior admission. Patient noted to have evidence of LLL pneumonia. Started on antibiotics as mentioned in problem, Endocarditis of tricuspid valve. Cefepime completed per ID  MRSA bacteremia -See problem, Endocarditis of tricuspid valve  IV drug abuse (HCC) History of. Patient reports continued abstinence since March of 2023. Patient reports not requiring resources for continued  abstinence.  Thrombocytopenia (HCC) resolved  Chronic pain Will d/c lyrica and continue gabapentin Continue percocet, morphine, toradol prn for acute  pain  Hyponatremia Mild. Possibly in setting of acute illness/pain. Back to wnl.  Abnormal CXR CXR with bilateral streaky/confluent opacities Pneumonia, edema, aspiration Follow BNP, doesn't appear overtly overloaded Currently on vanc/micafungin Will continue to monitor for now, consider broadening abx if recurrent fevers  Pleuritic chest pain Secondary to PE and persistent coughing. -continue Tussionex PRN -limit IV meds -Continue tessalon perles       DVT prophylaxis: lovenox Code Status: full Family Communication: father, children at bedside Disposition:   Status is: Inpatient Remains inpatient appropriate because: need for abx, ID, CT surgery c/s   Consultants:  ID CT surgery PCCM IR  Procedures:  Picc placement by IR  Antimicrobials:  Anti-infectives (From admission, onward)    Start     Dose/Rate Route Frequency Ordered Stop   10/19/21 2200  vancomycin (VANCOREADY) IVPB 1250 mg/250 mL        1,250 mg 166.7 mL/hr over 90 Minutes Intravenous Every 12 hours 10/19/21 1341     10/18/21 2200  linezolid (ZYVOX) tablet 600 mg  Status:  Discontinued        600 mg Oral Every 12 hours 10/18/21 1600 10/19/21 1341   10/18/21 1700  fluconazole (DIFLUCAN) tablet 800 mg        800 mg Oral  Once 10/18/21 1600 10/18/21 1657   10/17/21 2200  vancomycin (VANCOREADY) IVPB 1250 mg/250 mL  Status:  Discontinued        1,250 mg 166.7 mL/hr over 90 Minutes Intravenous Every 12 hours 10/17/21 1144 10/18/21 1600   10/17/21 1600  micafungin (MYCAMINE) 150 mg in sodium chloride 0.9 % 100 mL IVPB       See Hyperspace for full Linked Orders Report.   150 mg 107.5 mL/hr over 1 Hours Intravenous Every 24 hours 10/14/21 1115     10/14/21 1600  micafungin (MYCAMINE) 150 mg in sodium chloride 0.9 % 100 mL IVPB       See Hyperspace  for full Linked Orders Report.   150 mg 107.5 mL/hr over 1 Hours Intravenous Every 24 hours 10/14/21 1115 10/16/21 1746   10/13/21 2200  vancomycin (VANCOREADY) IVPB 1500 mg/300 mL  Status:  Discontinued        1,500 mg 150 mL/hr over 120 Minutes Intravenous Every 12 hours 10/13/21 1321 10/17/21 1144   10/12/21 1700  micafungin (MYCAMINE) 150 mg in sodium chloride 0.9 % 100 mL IVPB  Status:  Discontinued        150 mg 115 mL/hr over 1 Hours Intravenous Every 24 hours 10/12/21 1601 10/14/21 1115   10/11/21 2200  vancomycin (VANCOREADY) IVPB 1250 mg/250 mL  Status:  Discontinued        1,250 mg 166.7 mL/hr over 90 Minutes Intravenous Every 12 hours 10/11/21 0903 10/13/21 1321   10/11/21 1000  linezolid (ZYVOX) tablet 600 mg  Status:  Discontinued        600 mg Oral 2 times daily 10/11/21 0149 10/11/21 0813   10/11/21 1000  ceFEPIme (MAXIPIME) 2 g in sodium chloride 0.9 % 100 mL IVPB  Status:  Discontinued        2 g 200 mL/hr over 30 Minutes Intravenous Every 8 hours 10/11/21 0901 10/16/21 0938   10/11/21 1000  vancomycin (VANCOREADY) IVPB 1750 mg/350 mL        1,750 mg 175 mL/hr over 120 Minutes Intravenous  Once 10/11/21 0901 10/11/21 1242       Subjective: Notes fever overnight, otherwise, no new complaints  Objective: Vitals:   10/23/21  0745 10/23/21 1145 10/23/21 1200 10/23/21 1220  BP: (!) 148/95 (!) 120/91    Pulse: 100 (!) 102    Resp: (!) 26 (!) 24    Temp: 99.1 F (37.3 C) 99.3 F (37.4 C)    TempSrc: Oral Oral    SpO2: 100% 98%    Weight:   87.3 kg 87.3 kg  Height:        Intake/Output Summary (Last 24 hours) at 10/23/2021 1727 Last data filed at 10/23/2021 1726 Gross per 24 hour  Intake 1100.41 ml  Output --  Net 1100.41 ml   Filed Weights   10/21/21 0541 10/23/21 1200 10/23/21 1220  Weight: 88.5 kg 87.3 kg 87.3 kg    Examination:  General: No acute distress. Cardiovascular: RRR Lungs: unlabored, on RA Neurological: Alert and oriented 3. Moves all  extremities 4 with equal strength. Cranial nerves II through XII grossly intact. Skin: no visible rash Extremities: no notable edema   Data Reviewed: I have personally reviewed following labs and imaging studies  CBC: Recent Labs  Lab 10/18/21 0052 10/19/21 0202 10/20/21 0557 10/21/21 0526 10/21/21 1522 10/22/21 0602  WBC 18.6* 15.7* 16.4* 12.4*  --  14.2*  NEUTROABS  --  11.3* 11.6* 8.8*  --   --   HGB 8.5* 8.6* 8.0* 7.3* 7.8* 7.9*  HCT 28.8* 29.0* 27.7* 25.7* 26.6* 27.7*  MCV 78.5* 78.8* 80.8 80.8  --  81.2  PLT 368 384 397 323  --  347    Basic Metabolic Panel: Recent Labs  Lab 10/17/21 0149 10/18/21 0052 10/19/21 0202 10/20/21 0557 10/21/21 0526  NA 137 134* 135 133* 135  K 4.1 3.5 3.4* 4.1 3.8  CL 107 105 101 99 102  CO2 20* 23 22 25 25   GLUCOSE 116* 121* 112* 125* 141*  BUN 7 6 <5* 5* 5*  CREATININE 0.66 0.60 0.56 0.61 0.61  CALCIUM 8.4* 7.7* 8.2* 8.1* 7.8*  MG  --   --  1.9 2.0 2.1  PHOS  --   --  3.4 3.8 3.3    GFR: Estimated Creatinine Clearance: 110.3 mL/min (by C-G formula based on SCr of 0.61 mg/dL).  Liver Function Tests: Recent Labs  Lab 10/19/21 0202 10/20/21 0557 10/21/21 0526  AST 13* 12* 10*  ALT 7 8 7   ALKPHOS 62 57 52  BILITOT 0.6 0.6 0.4  PROT 7.6 7.3 6.9  ALBUMIN 2.3* 2.1* 2.0*    CBG: No results for input(s): "GLUCAP" in the last 168 hours.   Recent Results (from the past 240 hour(s))  Culture, blood (Routine X 2) w Reflex to ID Panel     Status: None   Collection Time: 10/14/21  1:43 PM   Specimen: BLOOD LEFT ARM  Result Value Ref Range Status   Specimen Description BLOOD LEFT ARM  Final   Special Requests   Final    BOTTLES DRAWN AEROBIC AND ANAEROBIC Blood Culture results may not be optimal due to an inadequate volume of blood received in culture bottles   Culture   Final    NO GROWTH 5 DAYS Performed at [facility], [address] [place], [address] [place]    Report Status 10/19/2021 FINAL  Final   Culture, blood (Routine X 2) w Reflex to ID Panel     Status: None   Collection Time: 10/14/21  1:44 PM   Specimen: BLOOD LEFT HAND  Result Value Ref Range Status   Specimen Description BLOOD LEFT HAND  Final  Special Requests   Final    BOTTLES DRAWN AEROBIC ONLY Blood Culture results may not be optimal due to an inadequate volume of blood received in culture bottles   Culture   Final    NO GROWTH 5 DAYS Performed at [facility], [address], [place], [place] [address]    Report Status 10/19/2021 FINAL  Final         Radiology Studies: DG Chest Port 1 View  Result Date: 10/22/2021 CLINICAL DATA:  Fever. EXAM: PORTABLE CHEST 1 VIEW COMPARISON:  Chest radiograph dated 10/09/2021 and CT dated 10/09/2021. FINDINGS: Left-sided PICC with tip at the junction of the innominate and SVC. Bilateral streaky and confluent opacities, progressed since the radiograph of 10/09/2021. No pleural effusion pneumothorax. Mild cardiomegaly. No acute osseous pathology. IMPRESSION: Bilateral streaky and confluent opacities, progressed since the radiograph of 10/09/2021. Findings may represent pneumonia, edema or aspiration. Electronically Signed   By: Elgie Collard M.D.   On: 10/22/2021 20:35        Scheduled Meds:  Chlorhexidine Gluconate Cloth  6 each Topical Daily   enoxaparin (LOVENOX) injection  130 mg Subcutaneous Q24H   ferrous gluconate  324 mg Oral Q breakfast   gabapentin  400 mg Oral TID   pantoprazole  40 mg Oral Daily   Continuous Infusions:  sodium chloride Stopped (10/22/21 0936)   micafungin (MYCAMINE) 150 mg in sodium chloride 0.9 % 100 mL IVPB 108 mL/hr at 10/23/21 1726   vancomycin Stopped (10/23/21 1222)     LOS: 12 days    Time spent: over 30 min    Lacretia Nicks, MD [facility]   To contact the attending provider between 7A-7P or the covering provider during after hours 7P-7A, please log into the web site [email or web address] and access using  universal [place] password for that web site. If you do not have the password, please call the hospital operator.  10/23/2021, 5:27 PM

## 2021-10-24 ENCOUNTER — Inpatient Hospital Stay (HOSPITAL_COMMUNITY): Payer: Medicaid Other

## 2021-10-24 DIAGNOSIS — J90 Pleural effusion, not elsewhere classified: Secondary | ICD-10-CM | POA: Diagnosis not present

## 2021-10-24 DIAGNOSIS — I2602 Saddle embolus of pulmonary artery with acute cor pulmonale: Secondary | ICD-10-CM | POA: Diagnosis not present

## 2021-10-24 DIAGNOSIS — I2699 Other pulmonary embolism without acute cor pulmonale: Secondary | ICD-10-CM | POA: Diagnosis not present

## 2021-10-24 DIAGNOSIS — B49 Unspecified mycosis: Secondary | ICD-10-CM | POA: Diagnosis not present

## 2021-10-24 DIAGNOSIS — T82598A Other mechanical complication of other cardiac and vascular devices and implants, initial encounter: Secondary | ICD-10-CM | POA: Diagnosis not present

## 2021-10-24 DIAGNOSIS — Z452 Encounter for adjustment and management of vascular access device: Secondary | ICD-10-CM | POA: Diagnosis not present

## 2021-10-24 DIAGNOSIS — B377 Candidal sepsis: Secondary | ICD-10-CM | POA: Diagnosis not present

## 2021-10-24 LAB — COMPREHENSIVE METABOLIC PANEL
ALT: 8 U/L (ref 0–44)
AST: 10 U/L — ABNORMAL LOW (ref 15–41)
Albumin: 1.9 g/dL — ABNORMAL LOW (ref 3.5–5.0)
Alkaline Phosphatase: 47 U/L (ref 38–126)
Anion gap: 7 (ref 5–15)
BUN: <5 mg/dL — ABNORMAL LOW (ref 6–20)
CO2: 24 mmol/L (ref 22–32)
Calcium: 7.9 mg/dL — ABNORMAL LOW (ref 8.9–10.3)
Chloride: 103 mmol/L (ref 98–111)
Creatinine, Ser: 0.68 mg/dL (ref 0.44–1.00)
GFR, Estimated: >60 mL/min (ref >60)
Glucose, Bld: 131 mg/dL — ABNORMAL HIGH (ref 70–99)
Potassium: 3.6 mmol/L (ref 3.5–5.1)
Sodium: 134 mmol/L — ABNORMAL LOW (ref 135–145)
Total Bilirubin: 0.4 mg/dL (ref 0.3–1.2)
Total Protein: 7.1 g/dL (ref 6.5–8.1)

## 2021-10-24 LAB — CBC WITH DIFFERENTIAL/PLATELET
Abs Immature Granulocytes: 0.1 10*3/uL — ABNORMAL HIGH (ref 0.00–0.07)
Basophils Absolute: 0 10*3/uL (ref 0.0–0.1)
Basophils Relative: 0 %
Eosinophils Absolute: 0.4 10*3/uL (ref 0.0–0.5)
Eosinophils Relative: 3 %
HCT: 26.2 % — ABNORMAL LOW (ref 36.0–46.0)
Hemoglobin: 7.6 g/dL — ABNORMAL LOW (ref 12.0–15.0)
Immature Granulocytes: 1 %
Lymphocytes Relative: 16 %
Lymphs Abs: 2.3 10*3/uL (ref 0.7–4.0)
MCH: 23.6 pg — ABNORMAL LOW (ref 26.0–34.0)
MCHC: 29 g/dL — ABNORMAL LOW (ref 30.0–36.0)
MCV: 81.4 fL (ref 80.0–100.0)
Monocytes Absolute: 1 10*3/uL (ref 0.1–1.0)
Monocytes Relative: 7 %
Neutro Abs: 10.2 10*3/uL — ABNORMAL HIGH (ref 1.7–7.7)
Neutrophils Relative %: 73 %
Platelets: 233 10*3/uL (ref 150–400)
RBC: 3.22 MIL/uL — ABNORMAL LOW (ref 3.87–5.11)
RDW: 23.1 % — ABNORMAL HIGH (ref 11.5–15.5)
WBC: 14 10*3/uL — ABNORMAL HIGH (ref 4.0–10.5)
nRBC: 0 % (ref 0.0–0.2)

## 2021-10-24 LAB — PHOSPHORUS: Phosphorus: 3.7 mg/dL (ref 2.5–4.6)

## 2021-10-24 LAB — MAGNESIUM: Magnesium: 2 mg/dL (ref 1.7–2.4)

## 2021-10-24 LAB — VANCOMYCIN, PEAK: Vancomycin Pk: 20 ug/mL — ABNORMAL LOW (ref 30–40)

## 2021-10-24 LAB — BRAIN NATRIURETIC PEPTIDE: B Natriuretic Peptide: 173.1 pg/mL — ABNORMAL HIGH (ref 0.0–100.0)

## 2021-10-24 IMAGING — DX DG HUMERUS 2V *L*
2 series · 2 of 2 positions shown · non-contrast
Comparison: None Available.

CLINICAL DATA: PICC infiltration

EXAM:
LEFT HUMERUS - 2+ VIEW

[humerus ap]
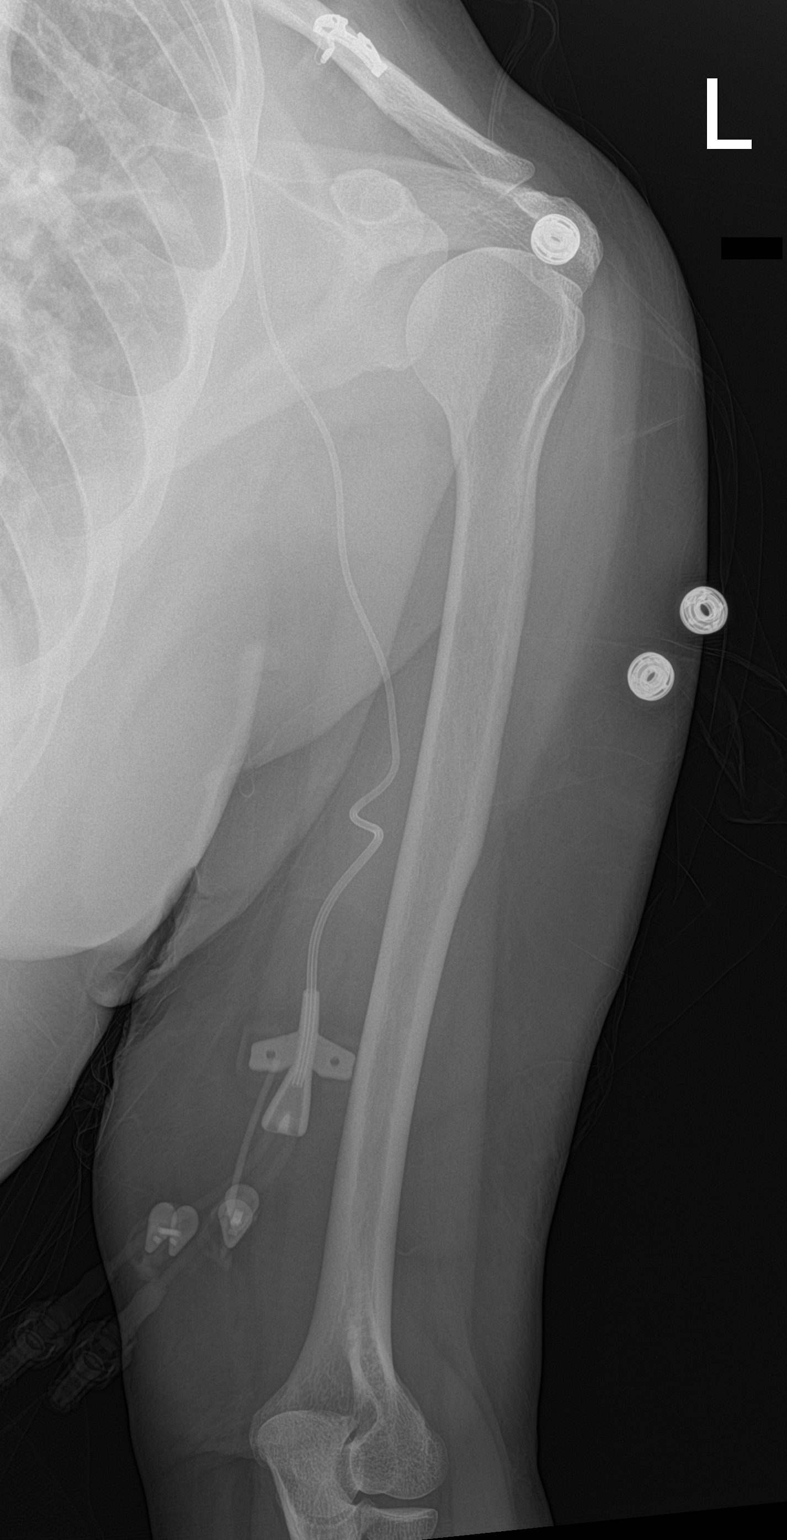

[humerus lat]
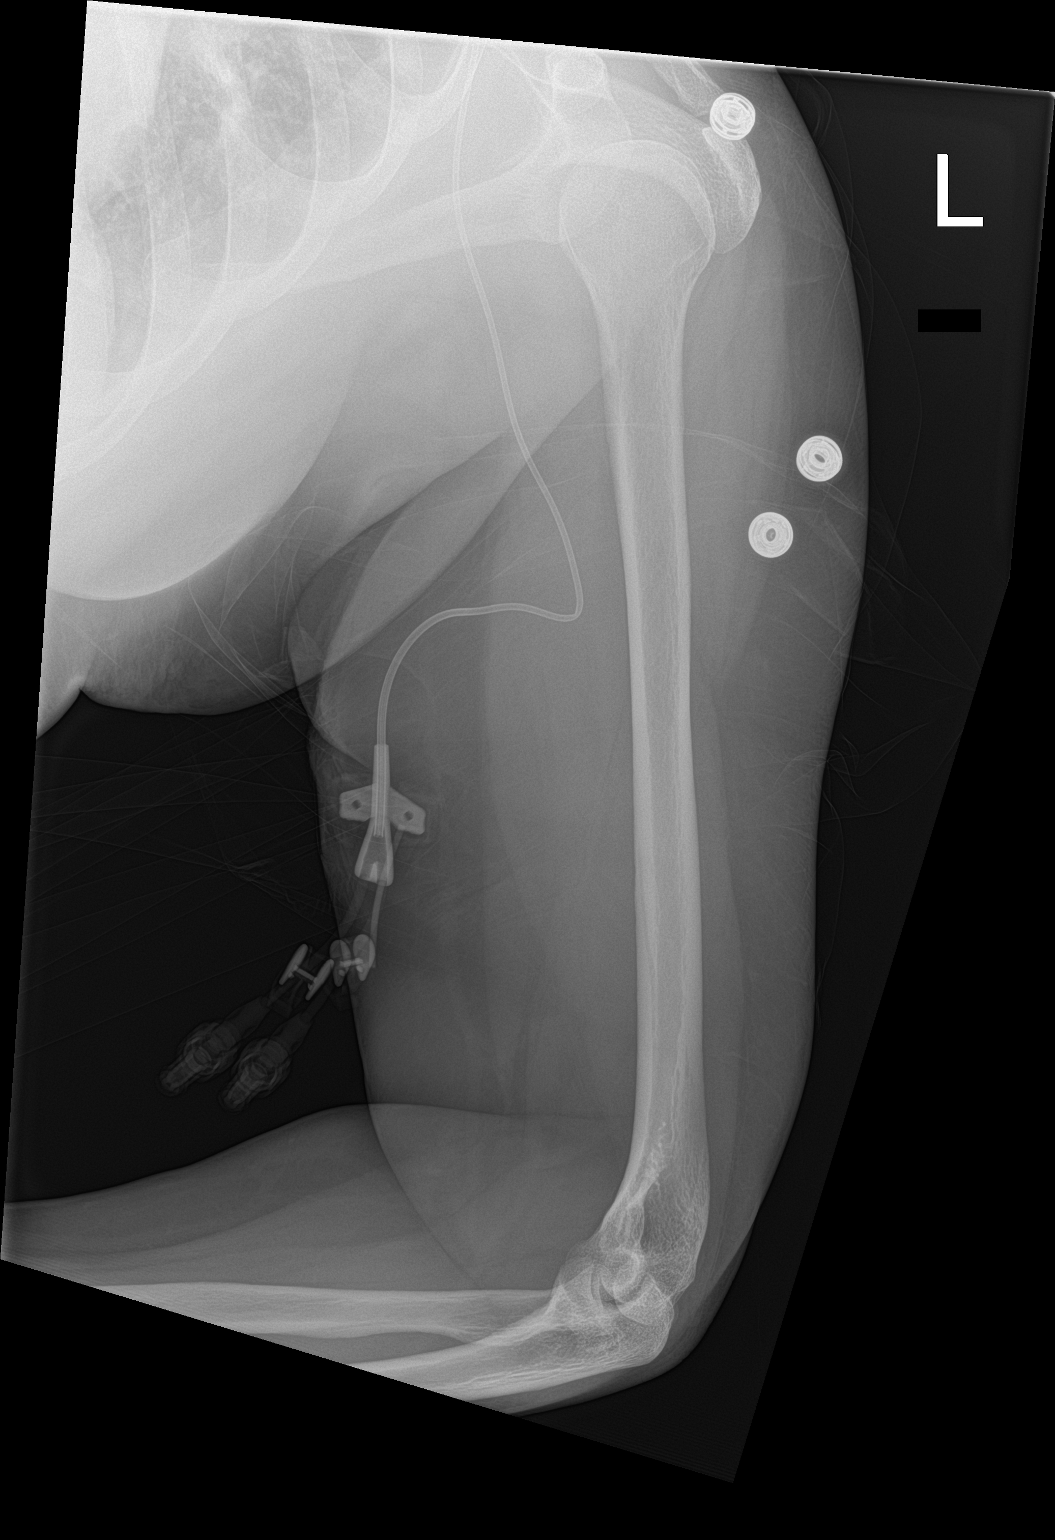

[2 of 2 positions shown; findings below may reference images not displayed]

FINDINGS: No evidence of PICC discontinuity at the level of the arm. There is
some tortuosity of the catheter with mild kink. No osseous
abnormality.
IMPRESSION: Mild kinking of the catheter from tortuosity. No evidence of
catheter fracture.

## 2021-10-24 IMAGING — XA IR REPLACE PICC
3 series · 3 of 3 positions shown · IV contrast (agent unspecified)
Comparison: none

INDICATION: Patient with bacteremia and left PICC placed in IR [DATE]. PICC
is now malfunctioning secondary to a kink in the line as
demonstrated on x-ray. Interventional Radiology asked to exchange
PICC

EXAM:
FLUOROSCOPIC GUIDED PICC LINE exchange
MEDICATIONS:
None
CONTRAST:  None
FLUOROSCOPY TIME:  Two mGy
COMPLICATIONS:
None immediate.
TECHNIQUE: The procedure, risks, benefits, and alternatives were explained to
the patient and informed written consent was obtained.

[Series 1: fl (-) angio · 1 of 1 slices shown (1 of 3)]
[im 1/1]
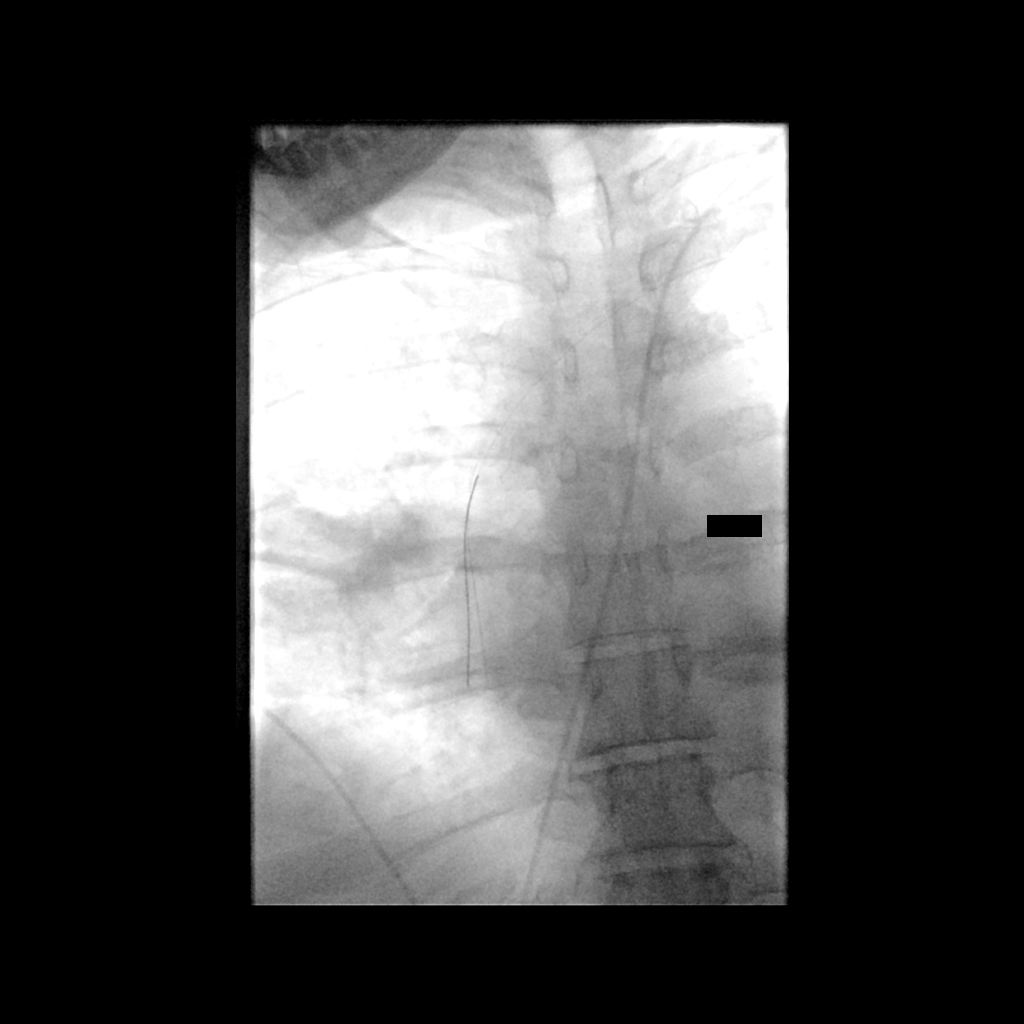

[Series 2: fl (-) angio · 1 of 1 slices shown (2 of 3)]
[im 1/1]
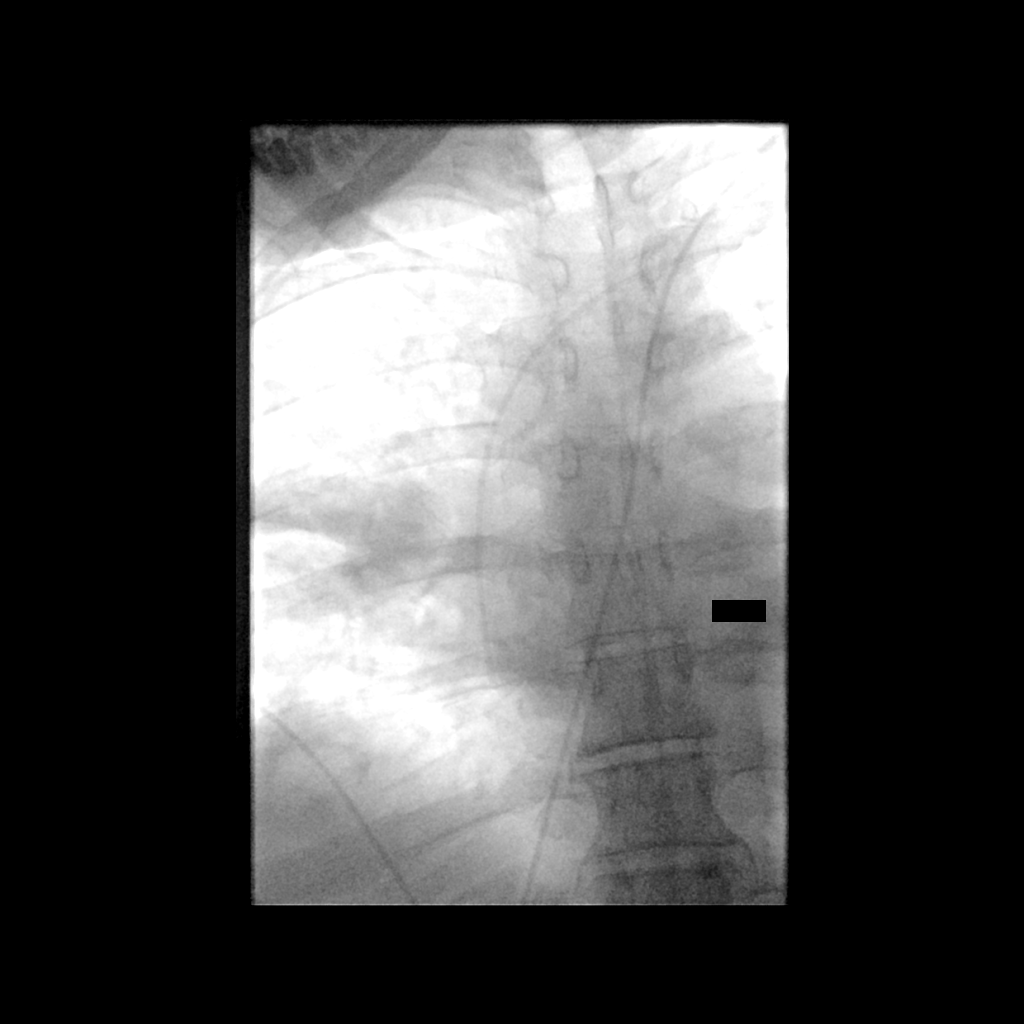

[Series 3: fl (-) angio · 1 of 1 slices shown (3 of 3)]
[im 1/1]
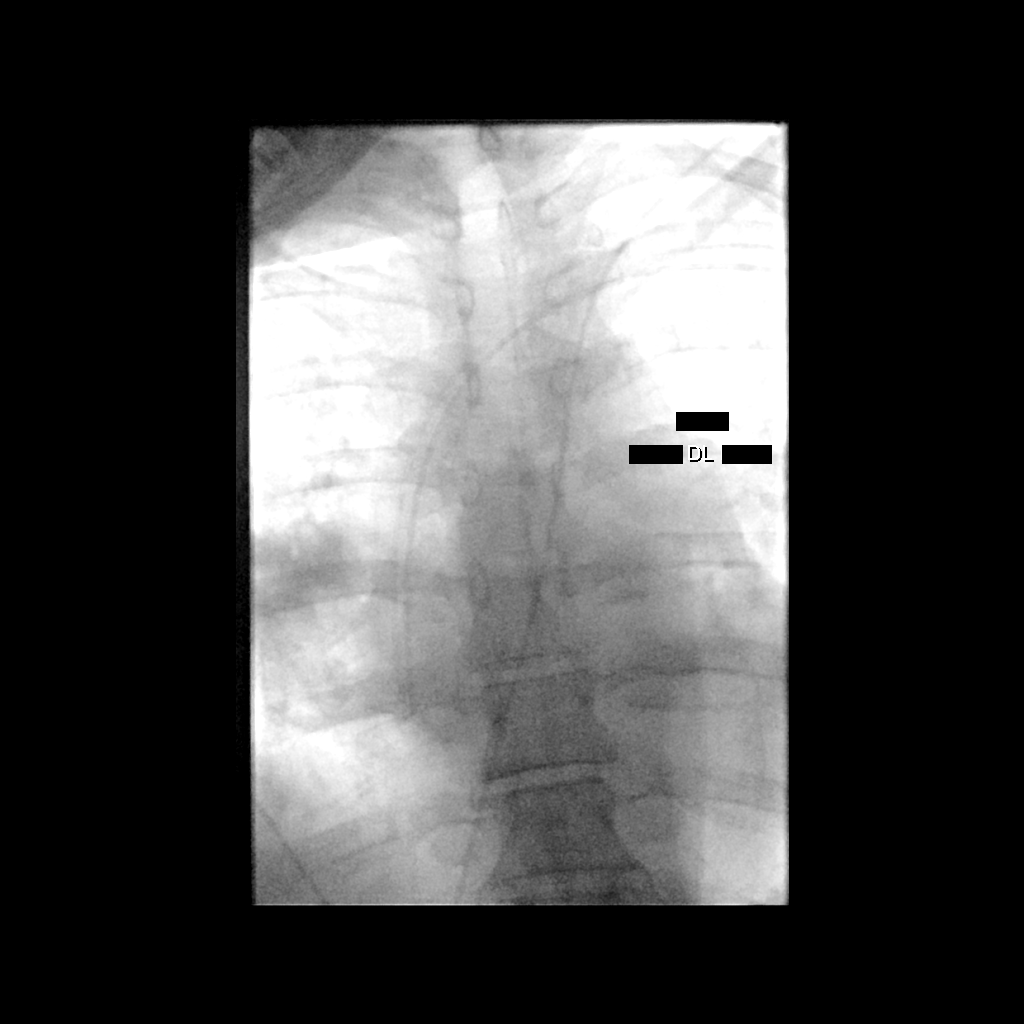

[3 of 3 positions shown; findings below may reference images not displayed]

Current PICC is a 35 cm, 5 French, dual-lumen into the left brachial
vein. The left upper extremity was prepped with chlorhexidine in a
sterile fashion, and a sterile drape was applied covering the
operative field. Maximum barrier sterile technique with sterile
gowns and gloves were used for the procedure. A timeout was
performed prior to the initiation of the procedure.

The existing catheter was cut and a guidewire was inserted. The old
catheter was removed. A peel-away sheath was placed. The guidewire
was advanced to the level of the superior caval atrial junction for
measurement purposes and a new PICC was cut to length. A 37 cm, 5
French, dual lumen catheter was inserted to the level of the
superior caval atrial junction. Post-procedure spot images were
obtained.

The catheter easily aspirated and flushed and was secured in place.
A dressing was placed. The patient tolerated the procedure well
without immediate post procedural complication.
FINDINGS: After catheter placement, the tip lies within the superior
cavoatrial junction. The catheter aspirates and flushes normally and
is ready for immediate use.
IMPRESSION: Successful ultrasound and fluoroscopic guided placement of a left
brachial vein approach, 37 cm, 5 French, dual lumen PICC with tip at
the superior caval-atrial junction. The PICC line is ready for
immediate use. Read by: XIMENES, NP

## 2021-10-24 IMAGING — US IR CHEST US
1 series · 2 of 2 positions shown · non-contrast
Comparison: Chest radiograph [DATE]

CLINICAL DATA: 35-year-old woman with right pleural effusion
presents to IR for right thoracentesis

EXAM:
CHEST ULTRASOUND

[Series 1: ir chest us · 2 of 2 slices shown]
[im 1/2]
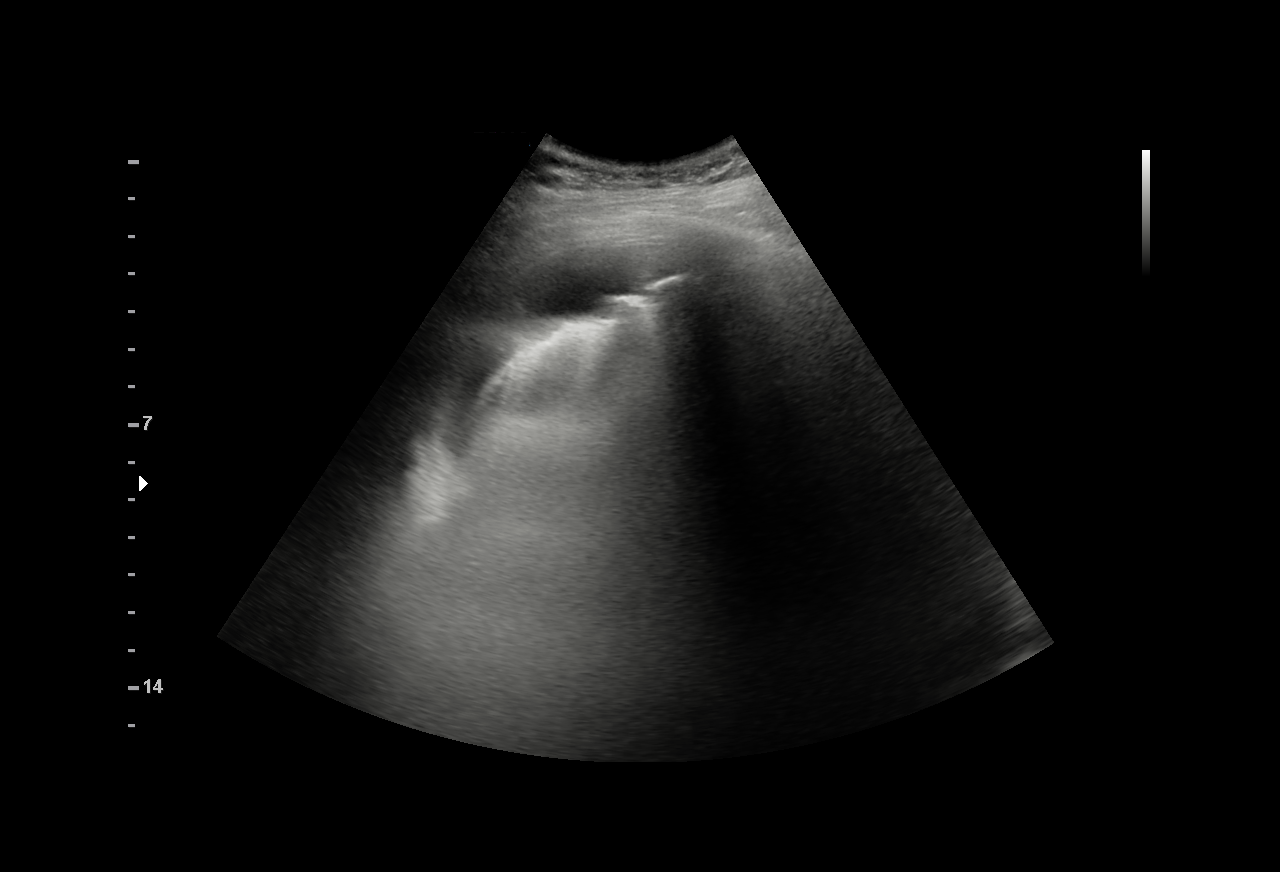
[im 2/2]
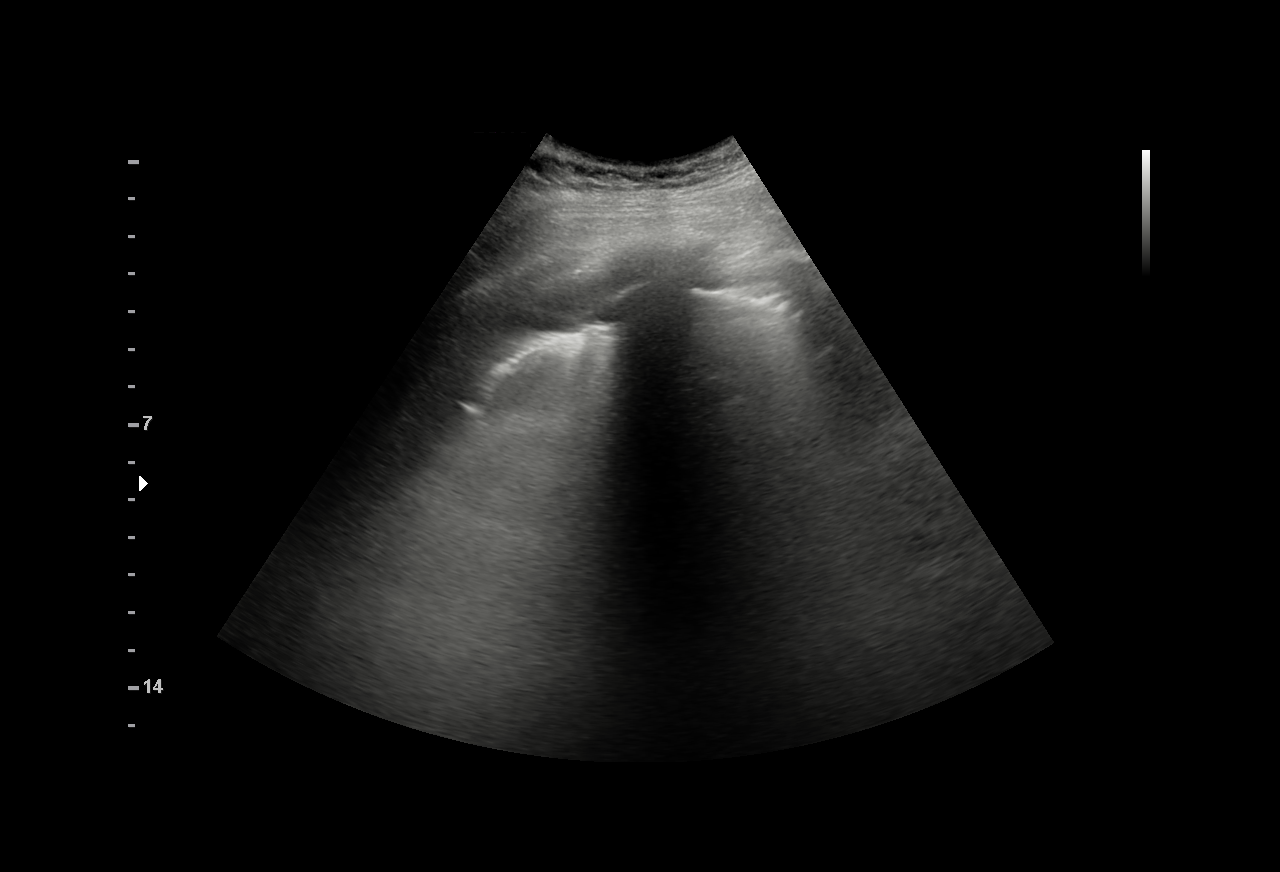

[2 of 2 positions shown; findings below may reference images not displayed]

FINDINGS: Trace right pleural effusion, insufficient for aspiration.
IMPRESSION: Trace right pleural effusion, insufficient for thoracentesis.

## 2021-10-24 IMAGING — DX DG CHEST 1V PORT
1 series · 1 of 1 positions shown · non-contrast
Comparison: Two days ago

CLINICAL DATA: Acute saddle pulmonary embolism

EXAM:
PORTABLE CHEST 1 VIEW

[chest ap]
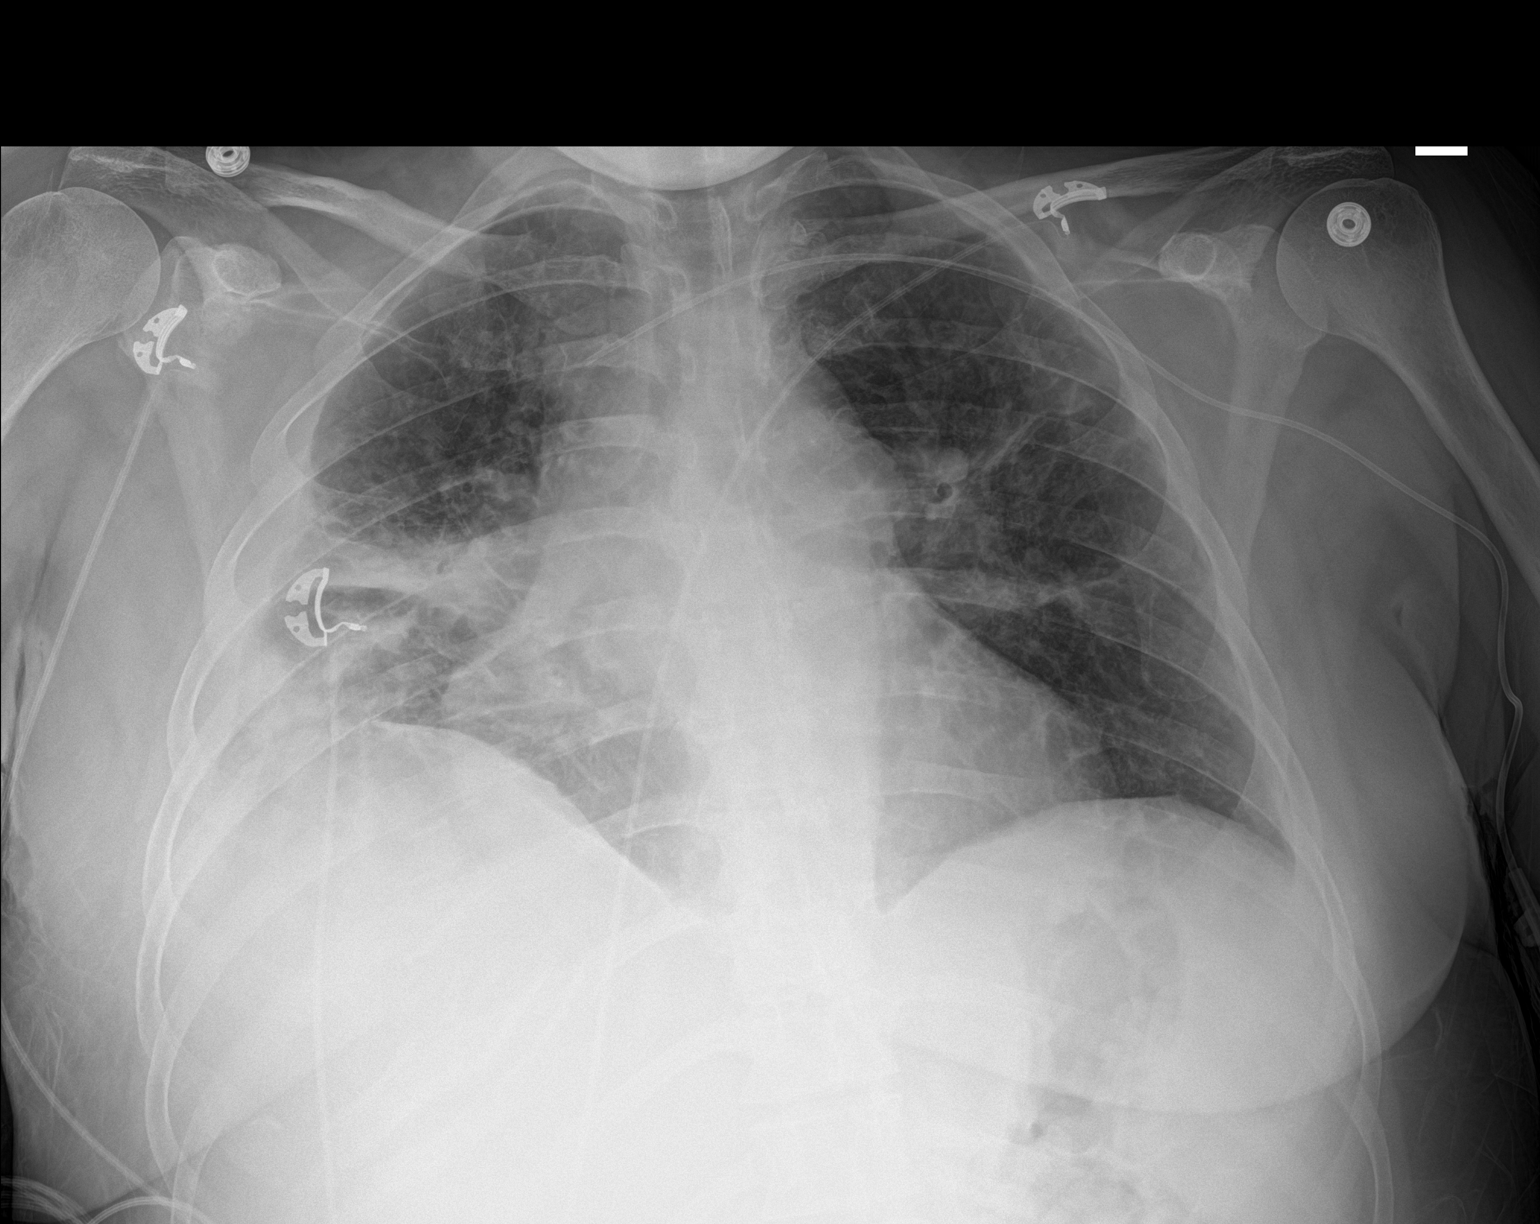

[1 of 1 positions shown; findings below may reference images not displayed]

FINDINGS: Low volume chest with hazy and bandlike opacity in the right mid to
lower lung. Patchy airspace type opacity in the left lung, stable or
improved. New right pleural effusion which is laterally positioned.
Borderline heart size with stable mediastinal contours. Left PICC
with tip near the left brachiocephalic and SVC confluence
IMPRESSION: 1. Worsening aeration on the right with new right pleural effusion.
2. Unchanged PICC positioning with tip near the brachiocephalic SVC
confluence.

## 2021-10-24 MED ORDER — SODIUM CHLORIDE 0.9 % IV SOLN
150.0000 mg | INTRAVENOUS | Status: AC
  Administered 2021-10-24 – 2021-11-25 (×33): 150 mg via INTRAVENOUS
  Filled 2021-10-24 (×34): qty 7.5

## 2021-10-24 MED ORDER — SODIUM CHLORIDE 0.9% FLUSH
10.0000 mL | INTRAVENOUS | Status: DC | PRN
  Administered 2021-11-18: 10 mL

## 2021-10-24 MED ORDER — VANCOMYCIN HCL 1250 MG/250ML IV SOLN
1250.0000 mg | Freq: Two times a day (BID) | INTRAVENOUS | Status: DC
  Administered 2021-10-24 – 2021-11-03 (×20): 1250 mg via INTRAVENOUS
  Filled 2021-10-24 (×22): qty 250

## 2021-10-24 MED ORDER — SODIUM CHLORIDE 0.9% FLUSH
10.0000 mL | Freq: Two times a day (BID) | INTRAVENOUS | Status: DC
  Administered 2021-10-24 – 2021-10-27 (×5): 10 mL
  Administered 2021-10-27: 20 mL
  Administered 2021-10-28 – 2021-11-03 (×14): 10 mL
  Administered 2021-11-04 (×2): 20 mL
  Administered 2021-11-05: 10 mL
  Administered 2021-11-05: 20 mL
  Administered 2021-11-06 – 2021-11-15 (×15): 10 mL
  Administered 2021-11-16: 20 mL
  Administered 2021-11-17 – 2021-11-19 (×7): 10 mL
  Administered 2021-11-20: 20 mL
  Administered 2021-11-21 (×2): 10 mL
  Administered 2021-11-24 – 2021-11-25 (×2): 30 mL
  Administered 2021-11-25: 10 mL

## 2021-10-24 MED ORDER — LINEZOLID 600 MG PO TABS
600.0000 mg | ORAL_TABLET | Freq: Two times a day (BID) | ORAL | Status: DC
  Administered 2021-10-24: 600 mg via ORAL
  Filled 2021-10-24 (×2): qty 1

## 2021-10-24 MED ORDER — MICAFUNGIN SODIUM 100 MG IV SOLR: 150.0000 mg | INTRAVENOUS | Status: DC

## 2021-10-24 MED ORDER — FLUCONAZOLE 200 MG PO TABS
800.0000 mg | ORAL_TABLET | Freq: Once | ORAL | Status: DC
Start: 2021-10-24 — End: 2021-10-24
  Filled 2021-10-24: qty 4

## 2021-10-24 NOTE — Procedures (Signed)
PROCEDURE SUMMARY:  Successful placement of image-guided double lumen PICC line to the left brachial vein. Length 37 cm. Tip at lower SVC/RA. No complications. EBL = <2 ml. Ready for use.  Please see imaging section of Epic for full dictation.   Mickie Kay, NP 10/24/2021 2:07 PM

## 2021-10-24 NOTE — Progress Notes (Signed)
PROGRESS NOTE    Latoya [name]  GGY:[id number] DOB: Jun 18, 1985 DOA: 10/11/2021 PCP: Pcp, No  No chief complaint on file.   Brief Narrative:  Latoya [name] is Latoya [name] 36 y.o. female with Latoya [name] history of MSSA tricuspid valve endocarditis, MRSA L4-L5 facet  join septic discitis in 07/2019 and more recently diagnosis of MRSA bacteremia with infective endocarditis and tricuspid valve vegetation diagnosed 07/2021.  Patient presented from [facility] for saddle pulmonary embolism.  Blood cultures growing candida albicans.  MRI L spine with findings concerning for septic arthritis.  ID was consulted.  Currently on vancomycin and micafungin.  Cardiothoracic surgery considering PFO closure/angio Vac for removal of TV vegetation.  Awaiting Ct surgery recs.      Assessment & Plan:   Principal Problem:   Acute saddle pulmonary embolism (HCC) Active Problems:   Candidemia (HCC)   Endocarditis of tricuspid valve   Lumbar discitis   Pneumonia of left lower lobe due to infectious organism   MRSA bacteremia   IV drug abuse (HCC)   Hyponatremia   Chronic pain   Thrombocytopenia (HCC)   Pleuritic chest pain   Abnormal CXR   Obesity (BMI 30-39.9)   Assessment and Plan: * Acute saddle pulmonary embolism (HCC) Noted on CTA chest. Acute on chronic diagnosis while on Eliquis PO. Started on Heparin IV. RV heart strain noted on imaging. Transthoracic Echocardiogram obtained which showed stable RV dysfunction from prior. Complicated by TV endocarditis with history of septic emboli. PCCM consulted with recommendations for continued anticoagulation and no thrombectomy. On room air. Hematology recommending Lovenox for treatment. Heparin not therapeutic. No procedures anticipated in the near future. Transitioned to Lovenox on 6/10. -Continue Lovenox (intermittently refusing this, dose adjusted to once daily -> if she continues to refuse, will need heparin gtt) -unchanged chest discomfort since admission related to  VTE, follow (additional w/u as indicated) -telemetry monitoring  Candidemia (HCC) New diagnosis with blood culture (6/7) resulting yeast. Patient started on micafungin IV. Leukocytosis fluctuating, follow. -Continue micafungin IV -Follow-up repeat blood cultures (6/10) NGx5 -PICC placed by IR 6/15 -> replaced 6/20 for kinking  Endocarditis of tricuspid valve Previously diagnosed. Initial plan for angio vac which was canceled secondary to PFO. Patient was treated for this as an inpatient and discharged on linezolid. Vancomycin and Cefepime restarted on admission. ID consulted. 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  In addition, MRI lumbar spine with evidence of discitis. - fever overnight, trend  - ua not convincing of UTI, CXR with bilateral streaky/confluent opacities - leukocytosis fluctuating - CxR with right effusion, [place] by IR with insufficient amount for thoracentesis -ID recommendations: continue vancomycin and micafungin (fluconazole/linezolid while awaiting access).  She's completed cefepime for possible pneumonia. -TCTS consulted on 6/8 for recommendations on persistent large TV vegetation. Plan to consider occlusion of her PFO for consideration of angio VAC debridement (Dr. Cliffton Asters to follow up 6/20)  Lumbar discitis Noted on imaging. ID consulted. Neurosurgery curbsided. Recommendations for IR aspiration/biopsy. IR unable to perform secondary to no target for aspiration/biopsy. In setting of persistent fevers concerning for persistent bacteremia. -See problem, Endocarditis of tricuspid valve  Pneumonia of left lower lobe due to infectious organism Patient with Latoya [name] history of septic emboli on prior admission. Patient noted to have evidence of LLL pneumonia. Started on antibiotics as mentioned in problem, Endocarditis of tricuspid valve. Cefepime completed per ID  MRSA bacteremia -See problem, Endocarditis of tricuspid  valve  IV drug abuse (HCC) History of. Patient reports continued  abstinence since March of 2023. Patient reports not requiring resources for continued abstinence.  Thrombocytopenia (HCC) resolved  Chronic pain Will d/c lyrica and continue gabapentin (uptitrate prn) Continue percocet, morphine prn for acute pain  Hyponatremia Mild. Possibly in setting of acute illness/pain. Back to wnl.  Abnormal CXR CXR 6/18 with bilateral streaky/confluent opacities possible Pneumonia, edema, aspiration Repeat CXR 6/20 with worsening aeration with new right effusion Follow BNP (elevated), doesn't appear overtly overloaded - hold off on lasix Will follow for now Currently on vanc/micafungin Will continue to monitor for now, consider broadening abx if recurrent fevers  Pleuritic chest pain Secondary to PE and persistent coughing. -continue Tussionex PRN -limit IV meds -Continue tessalon perles       DVT prophylaxis: lovenox Code Status: full Family Communication: none Disposition:   Status is: Inpatient Remains inpatient appropriate because: need for abx, ID, CT surgery c/s   Consultants:  ID CT surgery PCCM IR  Procedures:  Picc placement by IR  Antimicrobials:  Anti-infectives (From admission, onward)    Start     Dose/Rate Route Frequency Ordered Stop   10/25/21 1600  micafungin (MYCAMINE) 150 mg in sodium chloride 0.9 % 100 mL IVPB  Status:  Discontinued       See Hyperspace for full Linked Orders Report.   150 mg 107.5 mL/hr over 1 Hours Intravenous Every 24 hours 10/24/21 1301 10/24/21 1435   10/24/21 2200  vancomycin (VANCOREADY) IVPB 1250 mg/250 mL        1,250 mg 166.7 mL/hr over 90 Minutes Intravenous Every 12 hours 10/24/21 1435     10/24/21 1600  fluconazole (DIFLUCAN) tablet 800 mg  Status:  Discontinued        800 mg Oral  Once 10/24/21 1300 10/24/21 1435   10/24/21 1600  micafungin (MYCAMINE) 150 mg in sodium chloride 0.9 % 100 mL IVPB       See  Hyperspace for full Linked Orders Report.   150 mg 107.5 mL/hr over 1 Hours Intravenous Every 24 hours 10/24/21 1435     10/24/21 1100  linezolid (ZYVOX) tablet 600 mg  Status:  Discontinued        600 mg Oral Every 12 hours 10/24/21 0950 10/24/21 1509   10/19/21 2200  vancomycin (VANCOREADY) IVPB 1250 mg/250 mL  Status:  Discontinued        1,250 mg 166.7 mL/hr over 90 Minutes Intravenous Every 12 hours 10/19/21 1341 10/24/21 0950   10/18/21 2200  linezolid (ZYVOX) tablet 600 mg  Status:  Discontinued        600 mg Oral Every 12 hours 10/18/21 1600 10/19/21 1341   10/18/21 1700  fluconazole (DIFLUCAN) tablet 800 mg        800 mg Oral  Once 10/18/21 1600 10/18/21 1657   10/17/21 2200  vancomycin (VANCOREADY) IVPB 1250 mg/250 mL  Status:  Discontinued        1,250 mg 166.7 mL/hr over 90 Minutes Intravenous Every 12 hours 10/17/21 1144 10/18/21 1600   10/17/21 1600  micafungin (MYCAMINE) 150 mg in sodium chloride 0.9 % 100 mL IVPB  Status:  Discontinued       See Hyperspace for full Linked Orders Report.   150 mg 107.5 mL/hr over 1 Hours Intravenous Every 24 hours 10/14/21 1115 10/24/21 1301   10/14/21 1600  micafungin (MYCAMINE) 150 mg in sodium chloride 0.9 % 100 mL IVPB       See Hyperspace for full Linked Orders Report.   150 mg 107.5 mL/hr over  1 Hours Intravenous Every 24 hours 10/14/21 1115 10/16/21 1746   10/13/21 2200  vancomycin (VANCOREADY) IVPB 1500 mg/300 mL  Status:  Discontinued        1,500 mg 150 mL/hr over 120 Minutes Intravenous Every 12 hours 10/13/21 1321 10/17/21 1144   10/12/21 1700  micafungin (MYCAMINE) 150 mg in sodium chloride 0.9 % 100 mL IVPB  Status:  Discontinued        150 mg 115 mL/hr over 1 Hours Intravenous Every 24 hours 10/12/21 1601 10/14/21 1115   10/11/21 2200  vancomycin (VANCOREADY) IVPB 1250 mg/250 mL  Status:  Discontinued        1,250 mg 166.7 mL/hr over 90 Minutes Intravenous Every 12 hours 10/11/21 0903 10/13/21 1321   10/11/21 1000   linezolid (ZYVOX) tablet 600 mg  Status:  Discontinued        600 mg Oral 2 times daily 10/11/21 0149 10/11/21 0813   10/11/21 1000  ceFEPIme (MAXIPIME) 2 g in sodium chloride 0.9 % 100 mL IVPB  Status:  Discontinued        2 g 200 mL/hr over 30 Minutes Intravenous Every 8 hours 10/11/21 0901 10/16/21 0938   10/11/21 1000  vancomycin (VANCOREADY) IVPB 1750 mg/350 mL        1,750 mg 175 mL/hr over 120 Minutes Intravenous  Once 10/11/21 0901 10/11/21 1242       Subjective: No new complaints Persistent cough, discomfort  Objective: Vitals:   10/24/21 0425 10/24/21 0900 10/24/21 1338 10/24/21 2000  BP: (!) 161/98 140/90 124/90 (!) 132/97  Pulse: (!) 102  (!) 101 (!) 107  Resp: 18  16 18   Temp: 99 F (37.2 C)  98.6 F (37 C) 98.1 F (36.7 C)  TempSrc: Oral  Oral Oral  SpO2: 98%  96%   Weight: 86.3 kg     Height:        Intake/Output Summary (Last 24 hours) at 10/24/2021 2106 Last data filed at 10/24/2021 1723 Gross per 24 hour  Intake 313.72 ml  Output 1250 ml  Net -936.28 ml   Filed Weights   10/23/21 1200 10/23/21 1220 10/24/21 0425  Weight: 87.3 kg 87.3 kg 86.3 kg    Examination:  General: No acute distress. Cardiovascular: RRR Lungs: unlabored Abdomen: Soft, nontender, nondistended Neurological: Alert and oriented 3. Moves all extremities 4 . Cranial nerves II through XII grossly intact. Skin: Warm and dry. No rashes or lesions. Extremities: No clubbing or cyanosis. No edema.   Data Reviewed: I have personally reviewed following labs and imaging studies  CBC: Recent Labs  Lab 10/19/21 0202 10/20/21 0557 10/21/21 0526 10/21/21 1522 10/22/21 0602 10/24/21 0241  WBC 15.7* 16.4* 12.4*  --  14.2* 14.0*  NEUTROABS 11.3* 11.6* 8.8*  --   --  10.2*  HGB 8.6* 8.0* 7.3* 7.8* 7.9* 7.6*  HCT 29.0* 27.7* 25.7* 26.6* 27.7* 26.2*  MCV 78.8* 80.8 80.8  --  81.2 81.4  PLT 384 397 323  --  347 233    Basic Metabolic Panel: Recent Labs  Lab 10/18/21 0052  10/19/21 0202 10/20/21 0557 10/21/21 0526 10/24/21 0241  NA 134* 135 133* 135 134*  K 3.5 3.4* 4.1 3.8 3.6  CL 105 101 99 102 103  CO2 23 22 25 25 24   GLUCOSE 121* 112* 125* 141* 131*  BUN 6 <5* 5* 5* <5*  CREATININE 0.60 0.56 0.61 0.61 0.68  CALCIUM 7.7* 8.2* 8.1* 7.8* 7.9*  MG  --  1.9 2.0 2.1 2.0  PHOS  --  3.4 3.8 3.3 3.7    GFR: Estimated Creatinine Clearance: 109.7 mL/min (by C-G formula based on SCr of 0.68 mg/dL).  Liver Function Tests: Recent Labs  Lab 10/19/21 0202 10/20/21 0557 10/21/21 0526 10/24/21 0241  AST 13* 12* 10* 10*  ALT 7 8 7 8   ALKPHOS 62 57 52 47  BILITOT 0.6 0.6 0.4 0.4  PROT 7.6 7.3 6.9 7.1  ALBUMIN 2.3* 2.1* 2.0* 1.9*    CBG: No results for input(s): "GLUCAP" in the last 168 hours.   No results found for this or any previous visit (from the past 240 hour(s)).        Radiology Studies: IR US CHEST  Result Date: 10/24/2021 CLINICAL DATA:  36 year old woman with right pleural effusion presents to IR for right thoracentesis EXAM: CHEST ULTRASOUND COMPARISON:  Chest radiograph 10/24/2021 FINDINGS: Trace right pleural effusion, insufficient for aspiration. IMPRESSION: Trace right pleural effusion, insufficient for thoracentesis. Electronically Signed   By: Acquanetta BellingFarhaan  Mir M.D.   On: 10/24/2021 14:15   IR PICC REPLACEMENT LEFT INC IMG GUIDE  Result Date: 10/24/2021 INDICATION: Patient with bacteremia and left PICC placed in IR 10/19/2021. PICC is now malfunctioning secondary to Smita Lesh kink in the line as demonstrated on x-ray. Interventional Radiology asked to exchange PICC EXAM: FLUOROSCOPIC GUIDED PICC LINE exchange MEDICATIONS: None CONTRAST:  None FLUOROSCOPY TIME:  Two mGy COMPLICATIONS: None immediate. TECHNIQUE: The procedure, risks, benefits, and alternatives were explained to the patient and informed written consent was obtained. Current PICC is Georganna Maxson 35 cm, 5 [place], dual-lumen into the left brachial vein. The left upper extremity was prepped  with chlorhexidine in Cozette Braggs sterile fashion, and Shadeed Colberg sterile drape was applied covering the operative field. Maximum barrier sterile technique with sterile gowns and gloves were used for the procedure. Lena Fieldhouse timeout was performed prior to the initiation of the procedure. The existing catheter was cut and Riggins Cisek guidewire was inserted. The old catheter was removed. Flannery Cavallero peel-away sheath was placed. The guidewire was advanced to the level of the superior caval atrial junction for measurement purposes and Shakeisha Horine new PICC was cut to length. Masae Lukacs 37 cm, 5 [place], dual lumen catheter was inserted to the level of the superior caval atrial junction. Post-procedure spot images were obtained. The catheter easily aspirated and flushed and was secured in place. Garlon Tuggle dressing was placed. The patient tolerated the procedure well without immediate post procedural complication. FINDINGS: After catheter placement, the tip lies within the superior cavoatrial junction. The catheter aspirates and flushes normally and is ready for immediate use. IMPRESSION: Successful ultrasound and fluoroscopic guided placement of Jordany Russett left brachial vein approach, 37 cm, 5 French, dual lumen PICC with tip at the superior caval-atrial junction. The PICC line is ready for immediate use. Read by: Alwyn RenJamie Covington, NP Electronically Signed   By: Acquanetta BellingFarhaan  Mir M.D.   On: 10/24/2021 14:13   DG Humerus Left  Result Date: 10/24/2021 CLINICAL DATA:  PICC infiltration EXAM: LEFT HUMERUS - 2+ VIEW COMPARISON:  None Available. FINDINGS: No evidence of PICC discontinuity at the level of the arm. There is some tortuosity of the catheter with mild kink. No osseous abnormality. IMPRESSION: Mild kinking of the catheter from tortuosity. No evidence of catheter fracture. Electronically Signed   By: Tiburcio PeaJonathan  Watts M.D.   On: 10/24/2021 07:27   DG Chest Port 1 View  Result Date: 10/24/2021 CLINICAL DATA:  Acute saddle pulmonary embolism EXAM: PORTABLE CHEST 1 VIEW COMPARISON:  Two days ago  FINDINGS: Low  volume chest with hazy and bandlike opacity in the right mid to lower lung. Patchy airspace type opacity in the left lung, stable or improved. New right pleural effusion which is laterally positioned. Borderline heart size with stable mediastinal contours. Left PICC with tip near the left brachiocephalic and SVC confluence IMPRESSION: 1. Worsening aeration on the right with new right pleural effusion. 2. Unchanged PICC positioning with tip near the brachiocephalic SVC confluence. Electronically Signed   By: Tiburcio Pea M.D.   On: 10/24/2021 07:26        Scheduled Meds:  Chlorhexidine Gluconate Cloth  6 each Topical Daily   enoxaparin (LOVENOX) injection  130 mg Subcutaneous Q24H   ferrous gluconate  324 mg Oral Q breakfast   gabapentin  400 mg Oral TID   pantoprazole  40 mg Oral Daily   sodium chloride flush  10-40 mL Intracatheter Q12H   Continuous Infusions:  sodium chloride Stopped (10/22/21 0936)   micafungin (MYCAMINE) 150 mg in sodium chloride 0.9 % 100 mL IVPB 108 mL/hr at 10/24/21 1723   vancomycin       LOS: 13 days    Time spent: over 30 min    Lacretia Nicks, MD [facility]   To contact the attending provider between 7A-7P or the covering provider during after hours 7P-7A, please log into the web site [email or web address] and access using universal [place] password for that web site. If you do not have the password, please call the hospital operator.  10/24/2021, 9:06 PM

## 2021-10-24 NOTE — Progress Notes (Addendum)
Pharmacy Antibiotic Note  Latoya [name] is a 36 y.o. female admitted on 10/11/2021 with CP and back pain. Pt noted to have acute PE. Pt has significant history of MSSA and MRSA bacteremia c/b endocarditis and lumbar discitis. Pt recently completed 6 weeks of vancomycin and was started on linezolid after. Pharmacy has been consulted for vancomycin. Micafungin ordered as well with C. Albicans growing in blood. ID following.   PICC line kinked and unable to be use until exchange done by IR. Vancomycin peak 20 mcg/ml from earlier today, the patient refused trough levels. Will transition to linezolid and Fluoconazole until IV access re-established and plan to recheck levels at new steady state.   Plan: - Linezolid 600 mg po every 12 hours - Fluconazole 800 mg po x 1 dose - Will plan to resume Micafungin and Vancomycin once IV access re-established.  Addendum:  New PICC placed - so will resume Vancomycin and Micafungin dosing at this time.   Plan - Resume Vancomycin 1250 mg IV every 12 hours - Resume Micafungin 150 mg IV every 24 hours - Will f/u on new Vanc levels at steady state  10/24/2021 2:39 PM   Height: 5\' 6"  (167.6 cm) Weight: 86.3 kg (190 lb 3.2 oz) IBW/kg (Calculated) : 59.3  Temp (24hrs), Avg:99.2 F (37.3 C), Min:99 F (37.2 C), Max:99.3 F (37.4 C)  Recent Labs  Lab 10/18/21 0052 10/19/21 0202 10/20/21 0557 10/21/21 0526 10/22/21 0602 10/24/21 0100 10/24/21 0241  WBC 18.6* 15.7* 16.4* 12.4* 14.2*  --  14.0*  CREATININE 0.60 0.56 0.61 0.61  --   --  0.68  VANCOPEAK  --   --   --   --   --  20*  --      Estimated Creatinine Clearance: 109.7 mL/min (by C-G formula based on SCr of 0.68 mg/dL).    Allergies  Allergen Reactions   Naltrexone Anxiety    Severe anxiety Restlessness  Vomiting     Antimicrobials this admission: Vancomycin 6/7 >>  Cefepime 6/7 >> 6/12 Micafungin 6/8 >>  Microbiology results: 6/7 BCx: C. Albicans 6/7 MRSA PCR: negative 6/9 resp-  normal flora 6/10 blood x2- neg  Thank you for allowing pharmacy to be a part of this patient's care.  8/9, PharmD, BCPS Infectious Diseases Clinical Pharmacist 10/24/2021 1:24 PM   **Pharmacist phone directory can now be found on [email or web address] ([email or web address]).  Listed under [facility].

## 2021-10-24 NOTE — Progress Notes (Signed)
TR[facility] night cross cover note:   I was notified by RN of difficulty flushing and drawing back on one of the lumen of the patient's left-sided PICC line.  IV team subsequently assessed and they were also unable to flush one of the ports associated with PICC line.  IV team recommends resuming plain films to further evaluate for any mechanical obstruction, such as kink in the line, that could be contributing to the above before pursuing tPA.  Subsequently, please corresponding order for both cxr as well as plain film of left humerus to further assess the above.    Newton Pigg, DO Hospitalist

## 2021-10-24 NOTE — Progress Notes (Addendum)
IV team consulted by RN to assess PICC not flushing after  she did dressing change and blood draw. Spoke with Herbert Seta RN to call MD to get an order for X ray to verify PICC tip placement. PICC placed by IR on 6/15 with the tip on the superior cavoatrial junction. Chest X ray on 6/18, PICC tip is at the  junction of the innominate and SVC. Unable to flush red port and purple port very sluggish. Waiting for results.

## 2021-10-24 NOTE — Progress Notes (Addendum)
    [facility]   Reason for visit: follow up on TV endocarditis  Interval History: currently with significant sweats; afebrile > 24 hours Day 15 total antibiotics    Physical Exam: Constitutional:  Vitals:   10/24/21 0425 10/24/21 0900  BP: (!) 161/98 140/90  Pulse: (!) 102   Resp: 18   Temp: 99 F (37.2 C)   SpO2: 98%   Mild distress with sweating Respiratory: normal respiratory effort Skin: no rashes  Review of Systems: Constitutional: negative for fever or chills  Lab Results  Component Value Date   WBC 14.0 (H) 10/24/2021   HGB 7.6 (L) 10/24/2021   HCT 26.2 (L) 10/24/2021   MCV 81.4 10/24/2021   PLT 233 10/24/2021    Lab Results  Component Value Date   CREATININE 0.68 10/24/2021   BUN <5 (L) 10/24/2021   NA 134 (L) 10/24/2021   K 3.6 10/24/2021   CL 103 10/24/2021   CO2 24 10/24/2021    Lab Results  Component Value Date   ALT 8 10/24/2021   AST 10 (L) 10/24/2021   ALKPHOS 47 10/24/2021     Microbiology: Recent Results (from the past 240 hour(s))  Culture, blood (Routine X 2) w Reflex to ID Panel     Status: None   Collection Time: 10/14/21  1:43 PM   Specimen: BLOOD LEFT ARM  Result Value Ref Range Status   Specimen Description BLOOD LEFT ARM  Final   Special Requests   Final    BOTTLES DRAWN AEROBIC AND ANAEROBIC Blood Culture results may not be optimal due to an inadequate volume of blood received in culture bottles   Culture   Final    NO GROWTH 5 DAYS Performed at [facility], [address], [place], [place] [address]    Report Status 10/19/2021 FINAL  Final  Culture, blood (Routine X 2) w Reflex to ID Panel     Status: None   Collection Time: 10/14/21  1:44 PM   Specimen: BLOOD LEFT HAND  Result Value Ref Range Status   Specimen Description BLOOD LEFT HAND  Final   Special Requests   Final    BOTTLES DRAWN AEROBIC ONLY Blood Culture results may not be optimal due to an inadequate volume of blood  received in culture bottles   Culture   Final    NO GROWTH 5 DAYS Performed at [facility], [address] [place], [place] [address]    Report Status 10/19/2021 FINAL  Final    Impression/Plan:  1. TV endocarditis - large vegetation with regurgitation.  Will be evaluated by Dr. Cliffton Asters for consideration of angiovac.   Continue antibiotics   2.  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    3. Lumbar discitis - no aspiration done and on treatment as above.   4.  Access - current issues with her picc line noted and will use linezolid in the meantime and if no access later today, will use a dose of fluconazole.

## 2021-10-24 NOTE — Progress Notes (Signed)
Pharmacy advised that pt's PICC line in place. Vancomycin dose has been rescheduled.

## 2021-10-24 NOTE — Progress Notes (Addendum)
Pt is refusing blood draws, attending and pharmacist made aware. PICC line is kinked and we are unable to use.

## 2021-10-24 NOTE — Progress Notes (Signed)
Spoke to primary RN re: PICC tip location in the brachiocephalic and kinked in the humerus. RN made aware to let provider aware that PICC need exchange by IR. Will follow up.

## 2021-10-25 DIAGNOSIS — E669 Obesity, unspecified: Secondary | ICD-10-CM

## 2021-10-25 DIAGNOSIS — R9389 Abnormal findings on diagnostic imaging of other specified body structures: Secondary | ICD-10-CM

## 2021-10-25 DIAGNOSIS — J189 Pneumonia, unspecified organism: Secondary | ICD-10-CM | POA: Diagnosis not present

## 2021-10-25 DIAGNOSIS — E871 Hypo-osmolality and hyponatremia: Secondary | ICD-10-CM | POA: Diagnosis not present

## 2021-10-25 DIAGNOSIS — M4646 Discitis, unspecified, lumbar region: Secondary | ICD-10-CM | POA: Diagnosis not present

## 2021-10-25 DIAGNOSIS — I079 Rheumatic tricuspid valve disease, unspecified: Secondary | ICD-10-CM | POA: Diagnosis not present

## 2021-10-25 DIAGNOSIS — R7881 Bacteremia: Secondary | ICD-10-CM | POA: Diagnosis not present

## 2021-10-25 DIAGNOSIS — G894 Chronic pain syndrome: Secondary | ICD-10-CM | POA: Diagnosis not present

## 2021-10-25 DIAGNOSIS — B377 Candidal sepsis: Secondary | ICD-10-CM | POA: Diagnosis not present

## 2021-10-25 DIAGNOSIS — I2602 Saddle embolus of pulmonary artery with acute cor pulmonale: Secondary | ICD-10-CM | POA: Diagnosis not present

## 2021-10-25 LAB — COMPREHENSIVE METABOLIC PANEL
ALT: 7 U/L (ref 0–44)
AST: 13 U/L — ABNORMAL LOW (ref 15–41)
Albumin: 2.1 g/dL — ABNORMAL LOW (ref 3.5–5.0)
Alkaline Phosphatase: 57 U/L (ref 38–126)
Anion gap: 6 (ref 5–15)
BUN: <5 mg/dL — ABNORMAL LOW (ref 6–20)
CO2: 24 mmol/L (ref 22–32)
Calcium: 8.2 mg/dL — ABNORMAL LOW (ref 8.9–10.3)
Chloride: 104 mmol/L (ref 98–111)
Creatinine, Ser: 0.58 mg/dL (ref 0.44–1.00)
GFR, Estimated: >60 mL/min (ref >60)
Glucose, Bld: 103 mg/dL — ABNORMAL HIGH (ref 70–99)
Potassium: 4.2 mmol/L (ref 3.5–5.1)
Sodium: 134 mmol/L — ABNORMAL LOW (ref 135–145)
Total Bilirubin: 0.2 mg/dL — ABNORMAL LOW (ref 0.3–1.2)
Total Protein: 7.5 g/dL (ref 6.5–8.1)

## 2021-10-25 LAB — CBC WITH DIFFERENTIAL/PLATELET
Abs Immature Granulocytes: 0.12 10*3/uL — ABNORMAL HIGH (ref 0.00–0.07)
Basophils Absolute: 0.1 10*3/uL (ref 0.0–0.1)
Basophils Relative: 0 %
Eosinophils Absolute: 0.4 10*3/uL (ref 0.0–0.5)
Eosinophils Relative: 2 %
HCT: 28.5 % — ABNORMAL LOW (ref 36.0–46.0)
Hemoglobin: 8.1 g/dL — ABNORMAL LOW (ref 12.0–15.0)
Immature Granulocytes: 1 %
Lymphocytes Relative: 20 %
Lymphs Abs: 3.2 10*3/uL (ref 0.7–4.0)
MCH: 23.1 pg — ABNORMAL LOW (ref 26.0–34.0)
MCHC: 28.4 g/dL — ABNORMAL LOW (ref 30.0–36.0)
MCV: 81.4 fL (ref 80.0–100.0)
Monocytes Absolute: 1.1 10*3/uL — ABNORMAL HIGH (ref 0.1–1.0)
Monocytes Relative: 7 %
Neutro Abs: 11.3 10*3/uL — ABNORMAL HIGH (ref 1.7–7.7)
Neutrophils Relative %: 70 %
Platelets: 413 10*3/uL — ABNORMAL HIGH (ref 150–400)
RBC: 3.5 MIL/uL — ABNORMAL LOW (ref 3.87–5.11)
RDW: 23.7 % — ABNORMAL HIGH (ref 11.5–15.5)
WBC: 16.2 10*3/uL — ABNORMAL HIGH (ref 4.0–10.5)
nRBC: 0.2 % (ref 0.0–0.2)

## 2021-10-25 LAB — PHOSPHORUS: Phosphorus: 3.6 mg/dL (ref 2.5–4.6)

## 2021-10-25 LAB — MAGNESIUM: Magnesium: 2 mg/dL (ref 1.7–2.4)

## 2021-10-25 MED ORDER — PNEUMOCOCCAL 20-VAL CONJ VACC 0.5 ML IM SUSY
0.5000 mL | PREFILLED_SYRINGE | INTRAMUSCULAR | Status: AC
  Administered 2021-11-07: 0.5 mL via INTRAMUSCULAR
  Filled 2021-10-25 (×2): qty 0.5

## 2021-10-25 MED ORDER — OXYCODONE HCL ER 10 MG PO T12A
10.0000 mg | EXTENDED_RELEASE_TABLET | Freq: Two times a day (BID) | ORAL | Status: DC
  Administered 2021-10-25 – 2021-11-22 (×57): 10 mg via ORAL
  Filled 2021-10-25 (×57): qty 1

## 2021-10-25 MED ORDER — MORPHINE SULFATE (PF) 2 MG/ML IV SOLN
1.0000 mg | Freq: Three times a day (TID) | INTRAVENOUS | Status: DC | PRN
  Administered 2021-10-25 – 2021-10-26 (×3): 1 mg via INTRAVENOUS
  Filled 2021-10-25 (×3): qty 1

## 2021-10-25 MED ORDER — OXYCODONE-ACETAMINOPHEN 5-325 MG PO TABS
1.0000 | ORAL_TABLET | Freq: Four times a day (QID) | ORAL | Status: DC | PRN
  Administered 2021-10-25 – 2021-10-29 (×10): 1 via ORAL
  Filled 2021-10-25 (×10): qty 1

## 2021-10-25 NOTE — Progress Notes (Signed)
     [address]       [place] [address]             [phone]       No events Blood cultures cleared.  Given evidence of PFO at previous Angiovac attempt, he only option would be for surgical repair of her valve and PFO closure.  She will need to demonstrate long-term abstinence from illicit drugs.  This will be established as an outpatient  Recs: Continue abx per ID Follow-up with cardiology and cardiac surgery once discharged  Bernarda Erck O Kalaysia Demonbreun

## 2021-10-25 NOTE — Progress Notes (Signed)
PROGRESS NOTE    [name]  SAY:[id number] DOB: June 01, 1985 DOA: 10/11/2021 PCP: Pcp, No  No chief complaint on file.   Brief Narrative:  Latoya [name] is a 36 y.o. female with a history of MSSA tricuspid valve endocarditis, MRSA L4-L5 facet  join septic discitis in 07/2019 and more recently diagnosis of MRSA bacteremia with infective endocarditis and tricuspid valve vegetation diagnosed 07/2021.  Patient presented from [facility] for saddle pulmonary embolism.  Blood cultures growing candida albicans.  MRI L spine with findings concerning for septic arthritis.  ID was consulted.  Currently on vancomycin and micafungin.  Cardiothoracic surgery considering PFO closure/angio Vac for removal of TV vegetation.  Awaiting Ct surgery recs.    Assessment & Plan:   Principal Problem:   Acute saddle pulmonary embolism (HCC) Active Problems:   Candidemia (HCC)   Endocarditis of tricuspid valve   Lumbar discitis   Pneumonia of left lower lobe due to infectious organism   MRSA bacteremia   IV drug abuse (HCC)   Hyponatremia   Chronic pain   Thrombocytopenia (HCC)   Pleuritic chest pain   Abnormal CXR   Obesity (BMI 30-39.9)   Assessment and Plan:  Acute saddle pulmonary embolism (HCC), without hypoxia Acute on chronic diagnosis while on Eliquis PO. Pleuritic chest pain, stable, ACS ruled out Transitioned to heparin --> lovenox per heme/onc **Patient intermittently refusing -> transitioned to QD dosing - if continues to refuse will resume heparin gtt. Echo remarkable for RV strain Complicated by TV endocarditis with history of septic emboli.  Thrombectomy not indicated per previous evaluation/guidelines Hematology recommending Lovenox for definitive treatment - started 6/10. -unchanged chest discomfort since admission related to VTE, follow -telemetry monitoring  Sepsis, multifactorial See below - candidemia, endocarditis, and possible discitis   Candidemia (HCC) (C  albicans) Per blood culture (6/7) resulting yeast.  - Continue micafungin IV - Follow-up repeat blood cultures (6/10) NGx5 - PICC placed by IR 6/15 -> replaced 6/20 for kinking  Endocarditis of tricuspid valve MRSA Bacteremia(POA/prior) Chronic/Previous diagnosis Angiovac canceled 2/2 PFO --> discharged on linezolid previously Transitioned to vanc/cefepime at admission Added antifungal 2/2 above ID recommendations: continue vancomycin and micafungin (fluconazole/linezolid transiently dosed while IV access was limited).   - She's completed cefepime for possible pneumonia. - TCTS consulted on 6/8 for recommendations on persistent large TV vegetation.  - Cariovascular following, given previous angio VAC attempt only possible etiology for repair would be surgical repair of the valve and PFO closure.  This will need to be an outpatient follow-up and work-up to ensure illicit substance abstinence.  Lumbar discitis Noted on imaging.  ID consulted. Neurosurgery curbsided.  Plan:  IR unable to perform secondary to no target for aspiration/biopsy Continue antibiotic/antifungal course as above  Pneumonia of left lower lobe due to infectious organism, resolved Patient with a history of septic emboli on prior admission.  LLL pna HCAP vs CAP - on vancomycin for above - completed cefepime per ID  IV drug abuse (HCC) History of. Patient reports continued abstinence since March of 2023. Patient reports not requiring resources for continued abstinence.  Thrombocytopenia (HCC) resolved  Chronic pain Pleuritic chest pain, acute, secondary to above Will d/c lyrica and continue gabapentin (uptitrate prn) Transition to long acting scheduled oxycontin (10mg ) - decrease both strength and frequency of percocet/morphine(IV) - attempt to wean off IV narcotics in the next 48h  Hyponatremia Mild. Possibly in setting of acute illness/pain. Back to wnl.  Abnormal CXR CXR 6/18 with bilateral  streaky/confluent opacities  possible Pneumonia, edema, aspiration in the setting of bacteremia/fungemia -On appropriate abx/antifungals -Thora 6/20 unable to obtain fluid/sample -Follow clinically  BMI Body mass index is 30.6 kg/m.  DVT prophylaxis: lovenox Code Status: full Family Communication: none Disposition:   Status is: Inpatient Remains inpatient appropriate because: need for abx, ID, CT surgery c/s   Consultants:  ID CT surgery PCCM IR  Procedures:  Picc placement by IR  Antimicrobials:  Anti-infectives (From admission, onward)    Start     Dose/Rate Route Frequency Ordered Stop   10/25/21 1600  micafungin (MYCAMINE) 150 mg in sodium chloride 0.9 % 100 mL IVPB  Status:  Discontinued       See Hyperspace for full Linked Orders Report.   150 mg 107.5 mL/hr over 1 Hours Intravenous Every 24 hours 10/24/21 1301 10/24/21 1435   10/24/21 2200  vancomycin (VANCOREADY) IVPB 1250 mg/250 mL        1,250 mg 166.7 mL/hr over 90 Minutes Intravenous Every 12 hours 10/24/21 1435     10/24/21 1600  fluconazole (DIFLUCAN) tablet 800 mg  Status:  Discontinued        800 mg Oral  Once 10/24/21 1300 10/24/21 1435   10/24/21 1600  micafungin (MYCAMINE) 150 mg in sodium chloride 0.9 % 100 mL IVPB       See Hyperspace for full Linked Orders Report.   150 mg 107.5 mL/hr over 1 Hours Intravenous Every 24 hours 10/24/21 1435     10/24/21 1100  linezolid (ZYVOX) tablet 600 mg  Status:  Discontinued        600 mg Oral Every 12 hours 10/24/21 0950 10/24/21 1509   10/19/21 2200  vancomycin (VANCOREADY) IVPB 1250 mg/250 mL  Status:  Discontinued        1,250 mg 166.7 mL/hr over 90 Minutes Intravenous Every 12 hours 10/19/21 1341 10/24/21 0950   10/18/21 2200  linezolid (ZYVOX) tablet 600 mg  Status:  Discontinued        600 mg Oral Every 12 hours 10/18/21 1600 10/19/21 1341   10/18/21 1700  fluconazole (DIFLUCAN) tablet 800 mg        800 mg Oral  Once 10/18/21 1600 10/18/21 1657    10/17/21 2200  vancomycin (VANCOREADY) IVPB 1250 mg/250 mL  Status:  Discontinued        1,250 mg 166.7 mL/hr over 90 Minutes Intravenous Every 12 hours 10/17/21 1144 10/18/21 1600   10/17/21 1600  micafungin (MYCAMINE) 150 mg in sodium chloride 0.9 % 100 mL IVPB  Status:  Discontinued       See Hyperspace for full Linked Orders Report.   150 mg 107.5 mL/hr over 1 Hours Intravenous Every 24 hours 10/14/21 1115 10/24/21 1301   10/14/21 1600  micafungin (MYCAMINE) 150 mg in sodium chloride 0.9 % 100 mL IVPB       See Hyperspace for full Linked Orders Report.   150 mg 107.5 mL/hr over 1 Hours Intravenous Every 24 hours 10/14/21 1115 10/16/21 1746   10/13/21 2200  vancomycin (VANCOREADY) IVPB 1500 mg/300 mL  Status:  Discontinued        1,500 mg 150 mL/hr over 120 Minutes Intravenous Every 12 hours 10/13/21 1321 10/17/21 1144   10/12/21 1700  micafungin (MYCAMINE) 150 mg in sodium chloride 0.9 % 100 mL IVPB  Status:  Discontinued        150 mg 115 mL/hr over 1 Hours Intravenous Every 24 hours 10/12/21 1601 10/14/21 1115   10/11/21 2200  vancomycin (VANCOREADY)  IVPB 1250 mg/250 mL  Status:  Discontinued        1,250 mg 166.7 mL/hr over 90 Minutes Intravenous Every 12 hours 10/11/21 0903 10/13/21 1321   10/11/21 1000  linezolid (ZYVOX) tablet 600 mg  Status:  Discontinued        600 mg Oral 2 times daily 10/11/21 0149 10/11/21 0813   10/11/21 1000  ceFEPIme (MAXIPIME) 2 g in sodium chloride 0.9 % 100 mL IVPB  Status:  Discontinued        2 g 200 mL/hr over 30 Minutes Intravenous Every 8 hours 10/11/21 0901 10/16/21 0938   10/11/21 1000  vancomycin (VANCOREADY) IVPB 1750 mg/350 mL        1,750 mg 175 mL/hr over 120 Minutes Intravenous  Once 10/11/21 0901 10/11/21 1242       Subjective: No new complaints Persistent cough, discomfort  Objective: Vitals:   10/24/21 1338 10/24/21 2000 10/25/21 0020 10/25/21 0444  BP: 124/90 (!) 132/97 105/80 (!) 161/95  Pulse: (!) 101 (!) 107 (!) 103 (!)  110  Resp: 16 18 16 18   Temp: 98.6 F (37 C) 98.1 F (36.7 C) 98.6 F (37 C) 99.6 F (37.6 C)  TempSrc: Oral Oral Oral Oral  SpO2: 96%  96% 98%  Weight:    86 kg  Height:        Intake/Output Summary (Last 24 hours) at 10/25/2021 0615 Last data filed at 10/24/2021 2108 Gross per 24 hour  Intake 428.72 ml  Output --  Net 428.72 ml    Filed Weights   10/23/21 1220 10/24/21 0425 10/25/21 0444  Weight: 87.3 kg 86.3 kg 86 kg    Examination:  General: No acute distress. Cardiovascular: RRR Lungs: unlabored Abdomen: Soft, nontender, nondistended Neurological: Alert and oriented 3. Moves all extremities 4 . Cranial nerves II through XII grossly intact. Skin: Warm and dry. No rashes or lesions. Extremities: No clubbing or cyanosis. No edema.   Data Reviewed: I have personally reviewed following labs and imaging studies  CBC: Recent Labs  Lab 10/19/21 0202 10/20/21 0557 10/21/21 0526 10/21/21 1522 10/22/21 0602 10/24/21 0241 10/25/21 0449  WBC 15.7* 16.4* 12.4*  --  14.2* 14.0* 16.2*  NEUTROABS 11.3* 11.6* 8.8*  --   --  10.2* 11.3*  HGB 8.6* 8.0* 7.3* 7.8* 7.9* 7.6* 8.1*  HCT 29.0* 27.7* 25.7* 26.6* 27.7* 26.2* 28.5*  MCV 78.8* 80.8 80.8  --  81.2 81.4 81.4  PLT 384 397 323  --  347 233 413*     Basic Metabolic Panel: Recent Labs  Lab 10/19/21 0202 10/20/21 0557 10/21/21 0526 10/24/21 0241 10/25/21 0449  NA 135 133* 135 134* 134*  K 3.4* 4.1 3.8 3.6 4.2  CL 101 99 102 103 104  CO2 22 25 25 24 24   GLUCOSE 112* 125* 141* 131* 103*  BUN <5* 5* 5* <5* <5*  CREATININE 0.56 0.61 0.61 0.68 0.58  CALCIUM 8.2* 8.1* 7.8* 7.9* 8.2*  MG 1.9 2.0 2.1 2.0 2.0  PHOS 3.4 3.8 3.3 3.7 3.6     GFR: Estimated Creatinine Clearance: 109.5 mL/min (by C-G formula based on SCr of 0.58 mg/dL).  Liver Function Tests: Recent Labs  Lab 10/19/21 0202 10/20/21 0557 10/21/21 0526 10/24/21 0241 10/25/21 0449  AST 13* 12* 10* 10* 13*  ALT 7 8 7 8 7   ALKPHOS 62 57 52 47  57  BILITOT 0.6 0.6 0.4 0.4 0.2*  PROT 7.6 7.3 6.9 7.1 7.5  ALBUMIN 2.3* 2.1* 2.0* 1.9* 2.1*  CBG: No results for input(s): "GLUCAP" in the last 168 hours.   No results found for this or any previous visit (from the past 240 hour(s)).        Radiology Studies: IR US CHEST  Result Date: 10/24/2021 CLINICAL DATA:  36 year old woman with right pleural effusion presents to IR for right thoracentesis EXAM: CHEST ULTRASOUND COMPARISON:  Chest radiograph 10/24/2021 FINDINGS: Trace right pleural effusion, insufficient for aspiration. IMPRESSION: Trace right pleural effusion, insufficient for thoracentesis. Electronically Signed   By: Acquanetta Belling M.D.   On: 10/24/2021 14:15   IR PICC REPLACEMENT LEFT INC IMG GUIDE  Result Date: 10/24/2021 INDICATION: Patient with bacteremia and left PICC placed in IR 10/19/2021. PICC is now malfunctioning secondary to a kink in the line as demonstrated on x-ray. Interventional Radiology asked to exchange PICC EXAM: FLUOROSCOPIC GUIDED PICC LINE exchange MEDICATIONS: None CONTRAST:  None FLUOROSCOPY TIME:  Two mGy COMPLICATIONS: None immediate. TECHNIQUE: The procedure, risks, benefits, and alternatives were explained to the patient and informed written consent was obtained. Current PICC is a 35 cm, 5 [place], dual-lumen into the left brachial vein. The left upper extremity was prepped with chlorhexidine in a sterile fashion, and a sterile drape was applied covering the operative field. Maximum barrier sterile technique with sterile gowns and gloves were used for the procedure. A timeout was performed prior to the initiation of the procedure. The existing catheter was cut and a guidewire was inserted. The old catheter was removed. A peel-away sheath was placed. The guidewire was advanced to the level of the superior caval atrial junction for measurement purposes and a new PICC was cut to length. A 37 cm, 5 [place], dual lumen catheter was inserted to the level of  the superior caval atrial junction. Post-procedure spot images were obtained. The catheter easily aspirated and flushed and was secured in place. A dressing was placed. The patient tolerated the procedure well without immediate post procedural complication. FINDINGS: After catheter placement, the tip lies within the superior cavoatrial junction. The catheter aspirates and flushes normally and is ready for immediate use. IMPRESSION: Successful ultrasound and fluoroscopic guided placement of a left brachial vein approach, 37 cm, 5 French, dual lumen PICC with tip at the superior caval-atrial junction. The PICC line is ready for immediate use. Read by: Alwyn Ren, NP Electronically Signed   By: Acquanetta Belling M.D.   On: 10/24/2021 14:13   DG Humerus Left  Result Date: 10/24/2021 CLINICAL DATA:  PICC infiltration EXAM: LEFT HUMERUS - 2+ VIEW COMPARISON:  None Available. FINDINGS: No evidence of PICC discontinuity at the level of the arm. There is some tortuosity of the catheter with mild kink. No osseous abnormality. IMPRESSION: Mild kinking of the catheter from tortuosity. No evidence of catheter fracture. Electronically Signed   By: Tiburcio Pea M.D.   On: 10/24/2021 07:27   DG Chest Port 1 View  Result Date: 10/24/2021 CLINICAL DATA:  Acute saddle pulmonary embolism EXAM: PORTABLE CHEST 1 VIEW COMPARISON:  Two days ago FINDINGS: Low volume chest with hazy and bandlike opacity in the right mid to lower lung. Patchy airspace type opacity in the left lung, stable or improved. New right pleural effusion which is laterally positioned. Borderline heart size with stable mediastinal contours. Left PICC with tip near the left brachiocephalic and SVC confluence IMPRESSION: 1. Worsening aeration on the right with new right pleural effusion. 2. Unchanged PICC positioning with tip near the brachiocephalic SVC confluence. Electronically Signed   By: Tiburcio Pea  M.D.   On: 10/24/2021 07:26        Scheduled  Meds:  Chlorhexidine Gluconate Cloth  6 each Topical Daily   enoxaparin (LOVENOX) injection  130 mg Subcutaneous Q24H   ferrous gluconate  324 mg Oral Q breakfast   gabapentin  400 mg Oral TID   pantoprazole  40 mg Oral Daily   sodium chloride flush  10-40 mL Intracatheter Q12H   Continuous Infusions:  sodium chloride Stopped (10/22/21 0936)   micafungin (MYCAMINE) 150 mg in sodium chloride 0.9 % 100 mL IVPB 108 mL/hr at 10/24/21 1723   vancomycin 1,250 mg (10/24/21 2113)     LOS: 14 days    Time spent: over 30 min    Azucena FallenWilliam C Vy Badley, MD [facility]   To contact the attending provider between 7A-7P or the covering provider during after hours 7P-7A, please log into the web site [email or web address] and access using universal [place] password for that web site. If you do not have the password, please call the hospital operator.  10/25/2021, 6:15 AM

## 2021-10-26 DIAGNOSIS — R7881 Bacteremia: Secondary | ICD-10-CM | POA: Diagnosis not present

## 2021-10-26 DIAGNOSIS — M4646 Discitis, unspecified, lumbar region: Secondary | ICD-10-CM | POA: Diagnosis not present

## 2021-10-26 DIAGNOSIS — I079 Rheumatic tricuspid valve disease, unspecified: Secondary | ICD-10-CM | POA: Diagnosis not present

## 2021-10-26 DIAGNOSIS — E669 Obesity, unspecified: Secondary | ICD-10-CM | POA: Diagnosis not present

## 2021-10-26 DIAGNOSIS — R0781 Pleurodynia: Secondary | ICD-10-CM | POA: Diagnosis not present

## 2021-10-26 DIAGNOSIS — B377 Candidal sepsis: Secondary | ICD-10-CM | POA: Diagnosis not present

## 2021-10-26 DIAGNOSIS — E871 Hypo-osmolality and hyponatremia: Secondary | ICD-10-CM | POA: Diagnosis not present

## 2021-10-26 DIAGNOSIS — R9389 Abnormal findings on diagnostic imaging of other specified body structures: Secondary | ICD-10-CM | POA: Diagnosis not present

## 2021-10-26 DIAGNOSIS — G894 Chronic pain syndrome: Secondary | ICD-10-CM | POA: Diagnosis not present

## 2021-10-26 DIAGNOSIS — I2602 Saddle embolus of pulmonary artery with acute cor pulmonale: Secondary | ICD-10-CM | POA: Diagnosis not present

## 2021-10-26 MED ORDER — DILTIAZEM HCL 60 MG PO TABS
30.0000 mg | ORAL_TABLET | Freq: Four times a day (QID) | ORAL | Status: DC
  Administered 2021-10-26 – 2021-10-30 (×15): 30 mg via ORAL
  Filled 2021-10-26 (×17): qty 1

## 2021-10-26 MED ORDER — METOPROLOL TARTRATE 5 MG/5ML IV SOLN
5.0000 mg | INTRAVENOUS | Status: AC
Start: 2021-10-26 — End: 2021-10-26
  Administered 2021-10-26: 5 mg via INTRAVENOUS
  Filled 2021-10-26: qty 5

## 2021-10-26 MED ORDER — ENOXAPARIN SODIUM 150 MG/ML IJ SOSY
130.0000 mg | PREFILLED_SYRINGE | INTRAMUSCULAR | Status: DC
  Administered 2021-10-26 – 2021-10-27 (×2): 130 mg via SUBCUTANEOUS
  Filled 2021-10-26 (×2): qty 0.86

## 2021-10-26 MED ORDER — DILTIAZEM HCL-DEXTROSE 125-5 MG/125ML-% IV SOLN (PREMIX): 5.0000 mg/h | INTRAVENOUS | Status: DC

## 2021-10-26 NOTE — Significant Event (Addendum)
Rapid Response Event Note   Reason for Call :  HR 202 bpm Patient had just returned from the shower when she felt her heart "racing".   Initial Focused Assessment:  Pt lying in bed, AO. Skin is very warm, diaphoretic, pale. Pt denies lightheadedness, dizziness, chest pain, pain. She endorses feeling like her heart is racing. Radial pulse is rapid, regular. Lung sounds are clear. Mild JVD noted.   VS: T 98.40F, BP 102/72, HR 89, RR 16, SpO2 100% on 5LNC  Interventions:  -EKG -250 cc IVF bolus -Ice packs, cool room  -5mg  IV Lopressor  Plan of Care:  -Cardizem gtt -Ice packs for fever, PRN Tylenol for fevers  Call rapid response for additional needs  Event Summary:  MD Notified: Dr. Call Time: 1325 Arrival Time: 1330 End Time: 1400  Natale Milch, RN

## 2021-10-26 NOTE — Progress Notes (Signed)
PROGRESS NOTE    Latoya [name]  HER:[id number] DOB: March 06, 1986 DOA: 10/11/2021 PCP: Pcp, No  No chief complaint on file.   Brief Narrative:  Latoya [name] is a 36 y.o. female with a history of MSSA tricuspid valve endocarditis, MRSA L4-L5 facet  join septic discitis in 07/2019 and more recently diagnosis of MRSA bacteremia with infective endocarditis and tricuspid valve vegetation diagnosed 07/2021.  Patient presented from [facility] for saddle pulmonary embolism, found to have candida blood stream infection with follow up with IR, ID, CTSx in the setting of IV drug abuse.  Assessment & Plan:   Principal Problem:   Acute saddle pulmonary embolism (HCC) Active Problems:   Candidemia (HCC)   Endocarditis of tricuspid valve   Lumbar discitis   Pneumonia of left lower lobe due to infectious organism   MRSA bacteremia   IV drug abuse (HCC)   Hyponatremia   Chronic pain   Thrombocytopenia (HCC)   Pleuritic chest pain   Abnormal CXR   Obesity (BMI 30-39.9)  Assessment and Plan:  **Rapid response Acute transient symptomatic tachycardia/dyspnea/chest pressure - EKG/Tele reassuring - Likely secondary to saddle PE settling/moving/irritation - Single dose of metoprolol 5mg  IV with resolution of symptoms; dilt po/gtt ordered but not started given HR 70s once medications were made available. - Cardiology sidelined to review Tele/EKG - agree with above  Acute saddle pulmonary embolism (HCC), without hypoxia Acute on chronic diagnosis while on Eliquis PO. Pleuritic chest pain, stable, ACS ruled out Transitioned to heparin --> lovenox per heme/onc (Patient intermittently refusing -> transitioned to QD dosing - if continues to refuse will resume heparin gtt) Echo remarkable for RV strain Complicated by TV endocarditis with history of septic emboli.  Thrombectomy not indicated per previous evaluation/guidelines Hematology recommending Lovenox for definitive treatment - started  6/10. -unchanged chest discomfort since admission related to VTE, follow -telemetry monitoring  Sepsis, multifactorial See below - candidemia, endocarditis, and possible discitis Fevers ongoing but less frequent  Candidemia (HCC) (C albicans) Per blood culture (6/7) resulting yeast.  - Continue micafungin IV - Follow-up repeat blood cultures (6/10) NGx5 - PICC placed by IR 6/15 -> replaced 6/20 for kinking  Endocarditis of tricuspid valve MRSA Bacteremia(POA/prior) Chronic/Previous diagnosis Angiovac canceled 2/2 PFO --> discharged on linezolid previously Transitioned to vanc/cefepime at admission Added antifungal 2/2 candidemia above ID recommendations: continue vancomycin and micafungin (fluconazole/linezolid transiently dosed while IV access was limited).   - She's completed cefepime for possible pneumonia. - TCTS consulted on 6/8 for recommendations on persistent large TV vegetation.  - Cardiothoracic surgery following, given previous angio VAC attempt the only possible etiology for repair moving forward would be surgical repair of the valve and PFO closure. This will need to be an outpatient follow-up and work-up post antibiotic completion to ensure illicit substance abstinence.  Lumbar discitis Noted on imaging.  ID consulted. Neurosurgery curbsided.  IR unable to perform secondary to no target for aspiration/biopsy Continue antibiotic/antifungal course as above  Pneumonia of left lower lobe due to infectious organism, resolved Patient with a history of septic emboli on prior admission.  LLL pna HCAP vs CAP - on vancomycin for above - completed cefepime per ID  IV drug abuse (HCC) History of. Patient reports continued abstinence since March of 2023. Patient reports not requiring resources for continued abstinence.  Thrombocytopenia (HCC) resolved  Chronic pain Pleuritic chest pain, acute, secondary to above Will d/c lyrica and continue gabapentin (uptitrate  prn) Transition to long acting scheduled oxycontin (10mg ) - continue to  wean as appropriate Continue percocet, decrease frequency/dose as able. Morphine discontinued (4764m q8h previously)  Hyponatremia Mild. Possibly in setting of acute illness/pain. Back to wnl.  Abnormal CXR CXR 6/18 with bilateral streaky/confluent opacities possible Pneumonia, edema, aspiration in the setting of bacteremia/fungemia -On appropriate abx/antifungals -Thora 6/20 unable to obtain fluid/sample -Follow clinically  BMI Body mass index is 29.92 kg/m.  DVT prophylaxis: Lovenox Code Status: Full Family Communication: None Disposition:   Status is: Inpatient Remains inpatient appropriate because: need for abx, ID, CT surgery c/s   Consultants:  ID CT surgery PCCM IR  Procedures:  Picc placement by IR; replaced 6/20  Antimicrobials:  Anti-infectives (From admission, onward)    Start     Dose/Rate Route Frequency Ordered Stop   10/25/21 1600  micafungin (MYCAMINE) 150 mg in sodium chloride 0.9 % 100 mL IVPB  Status:  Discontinued       See Hyperspace for full Linked Orders Report.   150 mg 107.5 mL/hr over 1 Hours Intravenous Every 24 hours 10/24/21 1301 10/24/21 1435   10/24/21 2200  vancomycin (VANCOREADY) IVPB 1250 mg/250 mL        1,250 mg 166.7 mL/hr over 90 Minutes Intravenous Every 12 hours 10/24/21 1435     10/24/21 1600  fluconazole (DIFLUCAN) tablet 800 mg  Status:  Discontinued        800 mg Oral  Once 10/24/21 1300 10/24/21 1435   10/24/21 1600  micafungin (MYCAMINE) 150 mg in sodium chloride 0.9 % 100 mL IVPB       See Hyperspace for full Linked Orders Report.   150 mg 107.5 mL/hr over 1 Hours Intravenous Every 24 hours 10/24/21 1435     10/24/21 1100  linezolid (ZYVOX) tablet 600 mg  Status:  Discontinued        600 mg Oral Every 12 hours 10/24/21 0950 10/24/21 1509   10/19/21 2200  vancomycin (VANCOREADY) IVPB 1250 mg/250 mL  Status:  Discontinued        1,250 mg 166.7 mL/hr  over 90 Minutes Intravenous Every 12 hours 10/19/21 1341 10/24/21 0950   10/18/21 2200  linezolid (ZYVOX) tablet 600 mg  Status:  Discontinued        600 mg Oral Every 12 hours 10/18/21 1600 10/19/21 1341   10/18/21 1700  fluconazole (DIFLUCAN) tablet 800 mg        800 mg Oral  Once 10/18/21 1600 10/18/21 1657   10/17/21 2200  vancomycin (VANCOREADY) IVPB 1250 mg/250 mL  Status:  Discontinued        1,250 mg 166.7 mL/hr over 90 Minutes Intravenous Every 12 hours 10/17/21 1144 10/18/21 1600   10/17/21 1600  micafungin (MYCAMINE) 150 mg in sodium chloride 0.9 % 100 mL IVPB  Status:  Discontinued       See Hyperspace for full Linked Orders Report.   150 mg 107.5 mL/hr over 1 Hours Intravenous Every 24 hours 10/14/21 1115 10/24/21 1301   10/14/21 1600  micafungin (MYCAMINE) 150 mg in sodium chloride 0.9 % 100 mL IVPB       See Hyperspace for full Linked Orders Report.   150 mg 107.5 mL/hr over 1 Hours Intravenous Every 24 hours 10/14/21 1115 10/16/21 1746   10/13/21 2200  vancomycin (VANCOREADY) IVPB 1500 mg/300 mL  Status:  Discontinued        1,500 mg 150 mL/hr over 120 Minutes Intravenous Every 12 hours 10/13/21 1321 10/17/21 1144   10/12/21 1700  micafungin (MYCAMINE) 150 mg in sodium chloride 0.9 %  100 mL IVPB  Status:  Discontinued        150 mg 115 mL/hr over 1 Hours Intravenous Every 24 hours 10/12/21 1601 10/14/21 1115   10/11/21 2200  vancomycin (VANCOREADY) IVPB 1250 mg/250 mL  Status:  Discontinued        1,250 mg 166.7 mL/hr over 90 Minutes Intravenous Every 12 hours 10/11/21 0903 10/13/21 1321   10/11/21 1000  linezolid (ZYVOX) tablet 600 mg  Status:  Discontinued        600 mg Oral 2 times daily 10/11/21 0149 10/11/21 0813   10/11/21 1000  ceFEPIme (MAXIPIME) 2 g in sodium chloride 0.9 % 100 mL IVPB  Status:  Discontinued        2 g 200 mL/hr over 30 Minutes Intravenous Every 8 hours 10/11/21 0901 10/16/21 0938   10/11/21 1000  vancomycin (VANCOREADY) IVPB 1750 mg/350 mL         1,750 mg 175 mL/hr over 120 Minutes Intravenous  Once 10/11/21 0901 10/11/21 1242       Subjective: Low fever overnight - rapid response this afternoon with transient sinus tach (up to 200) resolved with 5mg  IV metoprolol and thought to be secondary to PE.  Objective: Vitals:   10/25/21 2032 10/25/21 2146 10/25/21 2340 10/26/21 0544  BP: (!) 113/92  118/85 (!) 133/96  Pulse: (!) 114  (!) 109 (!) 105  Resp:      Temp: (!) 101.6 F (38.7 C) 99.9 F (37.7 C) 98.6 F (37 C) 98.3 F (36.8 C)  TempSrc: Oral Oral Oral Oral  SpO2: 95%  96% 95%  Weight:    84.1 kg  Height:        Intake/Output Summary (Last 24 hours) at 10/26/2021 0808 Last data filed at 10/26/2021 0548 Gross per 24 hour  Intake 2717.6 ml  Output 1600 ml  Net 1117.6 ml    Filed Weights   10/24/21 0425 10/25/21 0444 10/26/21 0544  Weight: 86.3 kg 86 kg 84.1 kg    Examination:  General: No acute distress. Cardiovascular: RRR Lungs: unlabored Abdomen: Soft, nontender, nondistended Neurological: Alert and oriented 3. Moves all extremities 4 . Cranial nerves II through XII grossly intact. Skin: Warm and dry. No rashes Extremities: No clubbing or cyanosis. No edema.   Data Reviewed: I have personally reviewed following labs and imaging studies  CBC: Recent Labs  Lab 10/20/21 0557 10/21/21 0526 10/21/21 1522 10/22/21 0602 10/24/21 0241 10/25/21 0449  WBC 16.4* 12.4*  --  14.2* 14.0* 16.2*  NEUTROABS 11.6* 8.8*  --   --  10.2* 11.3*  HGB 8.0* 7.3* 7.8* 7.9* 7.6* 8.1*  HCT 27.7* 25.7* 26.6* 27.7* 26.2* 28.5*  MCV 80.8 80.8  --  81.2 81.4 81.4  PLT 397 323  --  347 233 413*     Basic Metabolic Panel: Recent Labs  Lab 10/20/21 0557 10/21/21 0526 10/24/21 0241 10/25/21 0449  NA 133* 135 134* 134*  K 4.1 3.8 3.6 4.2  CL 99 102 103 104  CO2 25 25 24 24   GLUCOSE 125* 141* 131* 103*  BUN 5* 5* <5* <5*  CREATININE 0.61 0.61 0.68 0.58  CALCIUM 8.1* 7.8* 7.9* 8.2*  MG 2.0 2.1 2.0 2.0  PHOS  3.8 3.3 3.7 3.6     GFR: Estimated Creatinine Clearance: 108.2 mL/min (by C-G formula based on SCr of 0.58 mg/dL).  Liver Function Tests: Recent Labs  Lab 10/20/21 0557 10/21/21 0526 10/24/21 0241 10/25/21 0449  AST 12* 10* 10* 13*  ALT  8 7 8 7   ALKPHOS 57 52 47 57  BILITOT 0.6 0.4 0.4 0.2*  PROT 7.3 6.9 7.1 7.5  ALBUMIN 2.1* 2.0* 1.9* 2.1*     CBG: No results for input(s): "GLUCAP" in the last 168 hours.   No results found for this or any previous visit (from the past 240 hour(s)).        Radiology Studies: IR CHEST  Result Date: 10/24/2021 CLINICAL DATA:  36 year old woman with right pleural effusion presents to IR for right thoracentesis EXAM: CHEST ULTRASOUND COMPARISON:  Chest radiograph 10/24/2021 FINDINGS: Trace right pleural effusion, insufficient for aspiration. IMPRESSION: Trace right pleural effusion, insufficient for thoracentesis. Electronically Signed   By: 10/26/2021 M.D.   On: 10/24/2021 14:15   IR PICC REPLACEMENT LEFT INC IMG GUIDE  Result Date: 10/24/2021 INDICATION: Patient with bacteremia and left PICC placed in IR 10/19/2021. PICC is now malfunctioning secondary to a kink in the line as demonstrated on x-ray. Interventional Radiology asked to exchange PICC EXAM: FLUOROSCOPIC GUIDED PICC LINE exchange MEDICATIONS: None CONTRAST:  None FLUOROSCOPY TIME:  Two mGy COMPLICATIONS: None immediate. TECHNIQUE: The procedure, risks, benefits, and alternatives were explained to the patient and informed written consent was obtained. Current PICC is a 35 cm, 5 10/21/2021, dual-lumen into the left brachial vein. The left upper extremity was prepped with chlorhexidine in a sterile fashion, and a sterile drape was applied covering the operative field. Maximum barrier sterile technique with sterile gowns and gloves were used for the procedure. A timeout was performed prior to the initiation of the procedure. The existing catheter was cut and a guidewire was inserted.  The old catheter was removed. A peel-away sheath was placed. The guidewire was advanced to the level of the superior caval atrial junction for measurement purposes and a new PICC was cut to length. A 37 cm, 5 [place], dual lumen catheter was inserted to the level of the superior caval atrial junction. Post-procedure spot images were obtained. The catheter easily aspirated and flushed and was secured in place. A dressing was placed. The patient tolerated the procedure well without immediate post procedural complication. FINDINGS: After catheter placement, the tip lies within the superior cavoatrial junction. The catheter aspirates and flushes normally and is ready for immediate use. IMPRESSION: Successful ultrasound and fluoroscopic guided placement of a left brachial vein approach, 37 cm, 5 French, dual lumen PICC with tip at the superior caval-atrial junction. The PICC line is ready for immediate use. Read by: [place], NP Electronically Signed   By: Alwyn Ren M.D.   On: 10/24/2021 14:13        Scheduled Meds:  Chlorhexidine Gluconate Cloth  6 each Topical Daily   enoxaparin (LOVENOX) injection  130 mg Subcutaneous Q24H   ferrous gluconate  324 mg Oral Q breakfast   gabapentin  400 mg Oral TID   oxyCODONE  10 mg Oral Q12H   pantoprazole  40 mg Oral Daily   pneumococcal 20-valent conjugate vaccine  0.5 mL Intramuscular Tomorrow-1000   sodium chloride flush  10-40 mL Intracatheter Q12H   Continuous Infusions:  sodium chloride Stopped (10/22/21 0936)   micafungin (MYCAMINE) 150 mg in sodium chloride 0.9 % 100 mL IVPB Stopped (10/25/21 1713)   vancomycin 1,250 mg (10/25/21 2052)     LOS: 15 days    Time spent: over 30 min    2053, MD [facility]   To contact the attending provider between 7A-7P or the covering provider during after  hours 7P-7A, please log into the web site [email or web address] and access using universal [place] password for that web site. If  you do not have the password, please call the hospital operator.  10/26/2021, 8:08 AM

## 2021-10-27 DIAGNOSIS — I2602 Saddle embolus of pulmonary artery with acute cor pulmonale: Secondary | ICD-10-CM | POA: Diagnosis not present

## 2021-10-27 LAB — BASIC METABOLIC PANEL
Anion gap: 9 (ref 5–15)
BUN: 8 mg/dL (ref 6–20)
CO2: 22 mmol/L (ref 22–32)
Calcium: 7.8 mg/dL — ABNORMAL LOW (ref 8.9–10.3)
Chloride: 100 mmol/L (ref 98–111)
Creatinine, Ser: 0.69 mg/dL (ref 0.44–1.00)
GFR, Estimated: >60 mL/min (ref >60)
Glucose, Bld: 139 mg/dL — ABNORMAL HIGH (ref 70–99)
Potassium: 4.5 mmol/L (ref 3.5–5.1)
Sodium: 131 mmol/L — ABNORMAL LOW (ref 135–145)

## 2021-10-27 LAB — CBC
HCT: 28.2 % — ABNORMAL LOW (ref 36.0–46.0)
Hemoglobin: 8 g/dL — ABNORMAL LOW (ref 12.0–15.0)
MCH: 23.3 pg — ABNORMAL LOW (ref 26.0–34.0)
MCHC: 28.4 g/dL — ABNORMAL LOW (ref 30.0–36.0)
MCV: 82 fL (ref 80.0–100.0)
Platelets: 381 10*3/uL (ref 150–400)
RBC: 3.44 MIL/uL — ABNORMAL LOW (ref 3.87–5.11)
RDW: 23.7 % — ABNORMAL HIGH (ref 11.5–15.5)
WBC: 15.3 10*3/uL — ABNORMAL HIGH (ref 4.0–10.5)
nRBC: 0.2 % (ref 0.0–0.2)

## 2021-10-27 LAB — VANCOMYCIN, TROUGH: Vancomycin Tr: 11 ug/mL — ABNORMAL LOW (ref 15–20)

## 2021-10-27 LAB — HEPARIN ANTI-XA: Heparin LMW: 0.48 IU/mL

## 2021-10-27 LAB — VANCOMYCIN, PEAK: Vancomycin Pk: 26 ug/mL — ABNORMAL LOW (ref 30–40)

## 2021-10-27 MED ORDER — KETOROLAC TROMETHAMINE 30 MG/ML IJ SOLN
30.0000 mg | Freq: Once | INTRAMUSCULAR | Status: AC
  Administered 2021-10-27: 30 mg via INTRAVENOUS
  Filled 2021-10-27: qty 1

## 2021-10-27 MED ORDER — ENOXAPARIN SODIUM 150 MG/ML IJ SOSY
150.0000 mg | PREFILLED_SYRINGE | INTRAMUSCULAR | Status: DC
  Administered 2021-10-28 – 2021-11-02 (×6): 150 mg via SUBCUTANEOUS
  Filled 2021-10-27 (×6): qty 1

## 2021-10-28 DIAGNOSIS — G894 Chronic pain syndrome: Secondary | ICD-10-CM | POA: Diagnosis not present

## 2021-10-28 DIAGNOSIS — B377 Candidal sepsis: Secondary | ICD-10-CM | POA: Diagnosis not present

## 2021-10-28 DIAGNOSIS — E871 Hypo-osmolality and hyponatremia: Secondary | ICD-10-CM | POA: Diagnosis not present

## 2021-10-28 DIAGNOSIS — M4646 Discitis, unspecified, lumbar region: Secondary | ICD-10-CM | POA: Diagnosis not present

## 2021-10-28 DIAGNOSIS — I2602 Saddle embolus of pulmonary artery with acute cor pulmonale: Secondary | ICD-10-CM | POA: Diagnosis not present

## 2021-10-28 DIAGNOSIS — I079 Rheumatic tricuspid valve disease, unspecified: Secondary | ICD-10-CM | POA: Diagnosis not present

## 2021-10-29 ENCOUNTER — Inpatient Hospital Stay (HOSPITAL_COMMUNITY): Payer: Medicaid Other

## 2021-10-29 DIAGNOSIS — I079 Rheumatic tricuspid valve disease, unspecified: Secondary | ICD-10-CM | POA: Diagnosis not present

## 2021-10-29 DIAGNOSIS — M4646 Discitis, unspecified, lumbar region: Secondary | ICD-10-CM | POA: Diagnosis not present

## 2021-10-29 DIAGNOSIS — G894 Chronic pain syndrome: Secondary | ICD-10-CM | POA: Diagnosis not present

## 2021-10-29 DIAGNOSIS — B377 Candidal sepsis: Secondary | ICD-10-CM | POA: Diagnosis not present

## 2021-10-29 DIAGNOSIS — I2602 Saddle embolus of pulmonary artery with acute cor pulmonale: Secondary | ICD-10-CM | POA: Diagnosis not present

## 2021-10-29 DIAGNOSIS — E871 Hypo-osmolality and hyponatremia: Secondary | ICD-10-CM | POA: Diagnosis not present

## 2021-10-29 DIAGNOSIS — R918 Other nonspecific abnormal finding of lung field: Secondary | ICD-10-CM | POA: Diagnosis not present

## 2021-10-29 LAB — BASIC METABOLIC PANEL
Anion gap: 7 (ref 5–15)
BUN: 6 mg/dL (ref 6–20)
CO2: 23 mmol/L (ref 22–32)
Calcium: 8.3 mg/dL — ABNORMAL LOW (ref 8.9–10.3)
Chloride: 103 mmol/L (ref 98–111)
Creatinine, Ser: 0.57 mg/dL (ref 0.44–1.00)
GFR, Estimated: >60 mL/min (ref >60)
Glucose, Bld: 104 mg/dL — ABNORMAL HIGH (ref 70–99)
Potassium: 4.1 mmol/L (ref 3.5–5.1)
Sodium: 133 mmol/L — ABNORMAL LOW (ref 135–145)

## 2021-10-29 LAB — CBC WITH DIFFERENTIAL/PLATELET
Abs Immature Granulocytes: 0 10*3/uL (ref 0.00–0.07)
Basophils Absolute: 0 10*3/uL (ref 0.0–0.1)
Basophils Relative: 0 %
Eosinophils Absolute: 0.9 10*3/uL — ABNORMAL HIGH (ref 0.0–0.5)
Eosinophils Relative: 5 %
HCT: 30.5 % — ABNORMAL LOW (ref 36.0–46.0)
Hemoglobin: 8.6 g/dL — ABNORMAL LOW (ref 12.0–15.0)
Lymphocytes Relative: 20 %
Lymphs Abs: 3.7 10*3/uL (ref 0.7–4.0)
MCH: 23.5 pg — ABNORMAL LOW (ref 26.0–34.0)
MCHC: 28.2 g/dL — ABNORMAL LOW (ref 30.0–36.0)
MCV: 83.3 fL (ref 80.0–100.0)
Monocytes Absolute: 1.3 10*3/uL — ABNORMAL HIGH (ref 0.1–1.0)
Monocytes Relative: 7 %
Neutro Abs: 12.6 10*3/uL — ABNORMAL HIGH (ref 1.7–7.7)
Neutrophils Relative %: 68 %
Platelets: 395 10*3/uL (ref 150–400)
RBC: 3.66 MIL/uL — ABNORMAL LOW (ref 3.87–5.11)
RDW: 24.5 % — ABNORMAL HIGH (ref 11.5–15.5)
WBC: 18.5 10*3/uL — ABNORMAL HIGH (ref 4.0–10.5)
nRBC: 0 /100 WBC
nRBC: 0.2 % (ref 0.0–0.2)

## 2021-10-29 LAB — MAGNESIUM: Magnesium: 2.1 mg/dL (ref 1.7–2.4)

## 2021-10-29 NOTE — Progress Notes (Signed)
PROGRESS NOTE    Latoya [name]  MWN:[id number] DOB: 1985-07-22 DOA: 10/11/2021 PCP: Pcp, No   Brief Narrative:  36 y.o. female with a history of MSSA tricuspid valve endocarditis, MRSA L4-L5 facet  join septic discitis in 07/2019 and more recently diagnosis of MRSA bacteremia with infective endocarditis and tricuspid valve vegetation diagnosed 07/2021 who presented from [facility] for saddle pulmonary embolism.  Blood cultures growing candida albicans.  MRI L spine with findings concerning for septic arthritis.  ID was consulted being managed w/ vancomycin and micafungin.Cardiothoracic surgery considering PFO closure/angio Vac for removal of TV vegetation. 6/22-rapid response with sinus tachycardia in the 200s transiently but resolved quickly w/ iv lopressor. 6/23-having low-grade fever 100.6 overnight, rest of the vitals stable with mild tachycardia.   6/24: No fever overnight.  Complaining of profuse sweating 6/25: She is concerned about duration of antibiotics/antifungal.  CTS recommended out patient follow-up, She is frustrated and would like to be transferred to another hospital if she is not getting surgery here.  Assessment & Plan:  Acute saddle pulmonary embolism -Patient with acute PE while on Eliquis, being managed IV heparin transitioned to Lovenox as per hematology oncology -Echo unremarkable for RV strain.   -Needs Lovenox for definitive treatment as per hematology oncology and needs outpatient follow-up   TV endocarditis Candidemia 2/2 TVE MRSA bacteremia POA: -large vegetation CT surgery following will need surgery at some point for her PFO after adequate antibiotic treatment.   -Continue vancomycin, micafungin per ID.  No angiovac planned.  PICC placed by IR 6/15 replaced 6/20 for kinking. -CTS recommended outpatient follow-up with cardiology and CTS once discharged.  Continue antibiotics per ID. -White cell trending up-ID recommended no further work-up if no new  symptoms or concerns of new infection   Lumbar discitis: Continue treatment as above.   Sepsis multifactorial POA with candidemia endocarditis discitis, having ongoing low-grade fever continue treatment as above Tylenol as needed   Pneumonia of left lower lobe due to infectious organism: LLL pneumonia HCAP versus CAP with septic emboli on prior admission.  Antibiotics as above   IVDA history reports continued abstinence since March 2023 needs resources and cessation counseling  Normocytic anemia: -H&H is stable between 7-8.  Monitor H&H closely and transfuse if hemoglobin less than 7  Thrombocytopenia resolved   Chronic pain/pleuritic chest pain:  -Continue gabapentin  Hyponatremia mild monitor  DVT prophylaxis: Lovenox Code Status: Full code Family Communication:  None present at bedside.  Plan of care discussed with patient in length and she verbalized understanding and agreed with it. Disposition Plan: To be determined  Consultants:  ID Heme oncology PCCM   Antimicrobials:  Vancomycin  Status is: Inpatient     Subjective: Patient seen and examined.  Sitting comfortably on the bed.  Continues to have sweats.  Had temperature of 100.1 this morning.  She is concerned about duration of IV antibiotics/antifungal-asking for how long she needs to be on it.  She is concerned if CTS is not performing surgery while she is hospitalized then she would like to be transferred to another hospital where she can get the surgery.  Objective: Vitals:   10/28/21 1953 10/28/21 2359 10/29/21 0355 10/29/21 0830  BP: (!) 129/94 95/75 122/86 124/88  Pulse:  (!) 104 (!) 105 (!) 102  Resp: 20 20 20 20   Temp: 100.1 F (37.8 C) 98.5 F (36.9 C) 98.2 F (36.8 C) 97.8 F (36.6 C)  TempSrc: Oral Oral Oral Oral  SpO2: 94% 92% 91% 96%  Weight:   85.6 kg   Height:        Intake/Output Summary (Last 24 hours) at 10/29/2021 1006 Last data filed at 10/28/2021 1500 Gross per 24 hour  Intake  466 ml  Output --  Net 466 ml    Filed Weights   10/27/21 0528 10/28/21 0447 10/29/21 0355  Weight: 83.9 kg 85.2 kg 85.6 kg    Examination:  General exam: Appears calm and comfortable, on room air, communicating well Respiratory system: Clear to auscultation. Respiratory effort normal. Cardiovascular system: Systolic murmur noted, RRR. No JVD, rubs, gallops or clicks. No pedal edema. Gastrointestinal system: Abdomen is nondistended, soft and nontender. No organomegaly or masses felt. Normal bowel sounds heard. Central nervous system: Alert and oriented. No focal neurological deficits. Extremities: Symmetric 5 x 5 power. Skin: No rashes, lesions or ulcers Psychiatry: Judgement and insight appear normal. Mood & affect appropriate.    Data Reviewed: I have personally reviewed following labs and imaging studies  CBC: Recent Labs  Lab 10/24/21 0241 10/25/21 0449 10/27/21 0210 10/29/21 0412  WBC 14.0* 16.2* 15.3* 18.5*  NEUTROABS 10.2* 11.3*  --  12.6*  HGB 7.6* 8.1* 8.0* 8.6*  HCT 26.2* 28.5* 28.2* 30.5*  MCV 81.4 81.4 82.0 83.3  PLT 233 413* 381 395    Basic Metabolic Panel: Recent Labs  Lab 10/24/21 0241 10/25/21 0449 10/27/21 0210 10/29/21 0412  NA 134* 134* 131* 133*  K 3.6 4.2 4.5 4.1  CL 103 104 100 103  CO2 24 24 22 23   GLUCOSE 131* 103* 139* 104*  BUN <5* <5* 8 6  CREATININE 0.68 0.58 0.69 0.57  CALCIUM 7.9* 8.2* 7.8* 8.3*  MG 2.0 2.0  --  2.1  PHOS 3.7 3.6  --   --     GFR: Estimated Creatinine Clearance: 109.2 mL/min (by C-G formula based on SCr of 0.57 mg/dL). Liver Function Tests: Recent Labs  Lab 10/24/21 0241 10/25/21 0449  AST 10* 13*  ALT 8 7  ALKPHOS 47 57  BILITOT 0.4 0.2*  PROT 7.1 7.5  ALBUMIN 1.9* 2.1*    No results for input(s): "LIPASE", "AMYLASE" in the last 168 hours. No results for input(s): "AMMONIA" in the last 168 hours. Coagulation Profile: No results for input(s): "INR", "PROTIME" in the last 168 hours. Cardiac  Enzymes: No results for input(s): "CKTOTAL", "CKMB", "CKMBINDEX", "TROPONINI" in the last 168 hours. BNP (last 3 results) No results for input(s): "PROBNP" in the last 8760 hours. HbA1C: No results for input(s): "HGBA1C" in the last 72 hours. CBG: No results for input(s): "GLUCAP" in the last 168 hours. Lipid Profile: No results for input(s): "CHOL", "HDL", "LDLCALC", "TRIG", "CHOLHDL", "LDLDIRECT" in the last 72 hours. Thyroid Function Tests: No results for input(s): "TSH", "T4TOTAL", "FREET4", "T3FREE", "THYROIDAB" in the last 72 hours. Anemia Panel: No results for input(s): "VITAMINB12", "FOLATE", "FERRITIN", "TIBC", "IRON", "RETICCTPCT" in the last 72 hours. Sepsis Labs: No results for input(s): "PROCALCITON", "LATICACIDVEN" in the last 168 hours.  No results found for this or any previous visit (from the past 240 hour(s)).    Radiology Studies: No results found.  Scheduled Meds:  Chlorhexidine Gluconate Cloth  6 each Topical Daily   diltiazem  30 mg Oral Q6H   enoxaparin (LOVENOX) injection  150 mg Subcutaneous Q24H   ferrous gluconate  324 mg Oral Q breakfast   gabapentin  400 mg Oral TID   oxyCODONE  10 mg Oral Q12H   pantoprazole  40 mg Oral Daily   pneumococcal  20-valent conjugate vaccine  0.5 mL Intramuscular Tomorrow-1000   sodium chloride flush  10-40 mL Intracatheter Q12H   Continuous Infusions:  sodium chloride Stopped (10/22/21 0936)   micafungin (MYCAMINE) 150 mg in sodium chloride 0.9 % 100 mL IVPB 150 mg (10/28/21 1623)   vancomycin 1,250 mg (10/28/21 2212)     LOS: 18 days   Time spent: 35 minutes   Glynis Hunsucker Estill Cotta, MD [facility]  If 7PM-7AM, please contact night-coverage [email or web address] 10/29/2021, 10:06 AM

## 2021-10-29 NOTE — Social Work (Signed)
CSW was alerted by MD that pt wanted to speak with CSW in regards to social issues. CSW attempted to meet with pt however pt was asleep.

## 2021-10-29 NOTE — Progress Notes (Signed)
Received call from RN that purple port on LUE PICC sluggish, he discussed with IV team who stated PICC was kinked and needed to be replaced in IR. Red port is working appropriately.   Per chart review this PICC was replaced 10/24/21 in IR for this reason and was reported to be working well. No imaging of the chest since that time.  Will order CXR today to assess PICC location - will follow up on imaging tomorrow AM to determine if PICC needs to be replaced/repositioned.   May continue to use PICC in the meantime.  Lynnette Caffey, PA-C

## 2021-10-30 DIAGNOSIS — B377 Candidal sepsis: Secondary | ICD-10-CM | POA: Diagnosis not present

## 2021-10-30 DIAGNOSIS — R7881 Bacteremia: Secondary | ICD-10-CM | POA: Diagnosis not present

## 2021-10-30 DIAGNOSIS — I2602 Saddle embolus of pulmonary artery with acute cor pulmonale: Secondary | ICD-10-CM | POA: Diagnosis not present

## 2021-10-30 DIAGNOSIS — I079 Rheumatic tricuspid valve disease, unspecified: Secondary | ICD-10-CM | POA: Diagnosis not present

## 2021-10-30 DIAGNOSIS — G894 Chronic pain syndrome: Secondary | ICD-10-CM | POA: Diagnosis not present

## 2021-10-30 DIAGNOSIS — M4646 Discitis, unspecified, lumbar region: Secondary | ICD-10-CM | POA: Diagnosis not present

## 2021-10-30 DIAGNOSIS — E871 Hypo-osmolality and hyponatremia: Secondary | ICD-10-CM | POA: Diagnosis not present

## 2021-10-30 DIAGNOSIS — J189 Pneumonia, unspecified organism: Secondary | ICD-10-CM | POA: Diagnosis not present

## 2021-10-30 DIAGNOSIS — B9562 Methicillin resistant Staphylococcus aureus infection as the cause of diseases classified elsewhere: Secondary | ICD-10-CM | POA: Diagnosis not present

## 2021-10-30 DIAGNOSIS — R0781 Pleurodynia: Secondary | ICD-10-CM | POA: Diagnosis not present

## 2021-10-30 LAB — CBC
HCT: 30.6 % — ABNORMAL LOW (ref 36.0–46.0)
Hemoglobin: 8.8 g/dL — ABNORMAL LOW (ref 12.0–15.0)
MCH: 23.8 pg — ABNORMAL LOW (ref 26.0–34.0)
MCHC: 28.8 g/dL — ABNORMAL LOW (ref 30.0–36.0)
MCV: 82.9 fL (ref 80.0–100.0)
Platelets: 439 10*3/uL — ABNORMAL HIGH (ref 150–400)
RBC: 3.69 MIL/uL — ABNORMAL LOW (ref 3.87–5.11)
RDW: 24.9 % — ABNORMAL HIGH (ref 11.5–15.5)
WBC: 18.2 10*3/uL — ABNORMAL HIGH (ref 4.0–10.5)
nRBC: 0.4 % — ABNORMAL HIGH (ref 0.0–0.2)

## 2021-10-30 LAB — BASIC METABOLIC PANEL
Anion gap: 10 (ref 5–15)
BUN: 6 mg/dL (ref 6–20)
CO2: 22 mmol/L (ref 22–32)
Calcium: 8.1 mg/dL — ABNORMAL LOW (ref 8.9–10.3)
Chloride: 103 mmol/L (ref 98–111)
Creatinine, Ser: 0.65 mg/dL (ref 0.44–1.00)
GFR, Estimated: >60 mL/min (ref >60)
Glucose, Bld: 118 mg/dL — ABNORMAL HIGH (ref 70–99)
Potassium: 4 mmol/L (ref 3.5–5.1)
Sodium: 135 mmol/L (ref 135–145)

## 2021-10-30 MED ORDER — OXYCODONE-ACETAMINOPHEN 5-325 MG PO TABS
1.0000 | ORAL_TABLET | Freq: Four times a day (QID) | ORAL | Status: DC | PRN
  Administered 2021-10-30: 2 via ORAL
  Administered 2021-10-30: 1 via ORAL
  Administered 2021-10-31 – 2021-11-26 (×61): 2 via ORAL
  Filled 2021-10-30 (×63): qty 2

## 2021-10-30 MED ORDER — PROCHLORPERAZINE MALEATE 10 MG PO TABS
10.0000 mg | ORAL_TABLET | Freq: Four times a day (QID) | ORAL | Status: DC | PRN
  Administered 2021-10-30 – 2021-11-08 (×7): 10 mg via ORAL
  Filled 2021-10-30 (×10): qty 1

## 2021-10-30 MED ORDER — METOPROLOL TARTRATE 25 MG PO TABS
25.0000 mg | ORAL_TABLET | Freq: Two times a day (BID) | ORAL | Status: DC
  Administered 2021-10-30 – 2021-11-09 (×20): 25 mg via ORAL
  Filled 2021-10-30 (×20): qty 1

## 2021-10-30 MED ORDER — ALTEPLASE 2 MG IJ SOLR
2.0000 mg | Freq: Once | INTRAMUSCULAR | Status: AC
  Administered 2021-10-30: 2 mg
  Filled 2021-10-30: qty 2

## 2021-10-30 MED ORDER — DILTIAZEM HCL 60 MG PO TABS
60.0000 mg | ORAL_TABLET | Freq: Three times a day (TID) | ORAL | Status: DC
  Administered 2021-10-30 – 2021-11-02 (×8): 60 mg via ORAL
  Filled 2021-10-30 (×8): qty 1

## 2021-10-30 NOTE — Progress Notes (Signed)
tPA allowed to dwell for 4 hours, Unable withdraw tPA, however after flushing purple lumen, able to get brisk blood return at this time. Notified nurse. Tomasita Morrow, RN VAST

## 2021-10-31 DIAGNOSIS — M4646 Discitis, unspecified, lumbar region: Secondary | ICD-10-CM | POA: Diagnosis not present

## 2021-10-31 DIAGNOSIS — B377 Candidal sepsis: Secondary | ICD-10-CM | POA: Diagnosis not present

## 2021-10-31 DIAGNOSIS — R0781 Pleurodynia: Secondary | ICD-10-CM | POA: Diagnosis not present

## 2021-10-31 DIAGNOSIS — B9562 Methicillin resistant Staphylococcus aureus infection as the cause of diseases classified elsewhere: Secondary | ICD-10-CM | POA: Diagnosis not present

## 2021-10-31 DIAGNOSIS — J189 Pneumonia, unspecified organism: Secondary | ICD-10-CM | POA: Diagnosis not present

## 2021-10-31 DIAGNOSIS — I079 Rheumatic tricuspid valve disease, unspecified: Secondary | ICD-10-CM | POA: Diagnosis not present

## 2021-10-31 DIAGNOSIS — I330 Acute and subacute infective endocarditis: Secondary | ICD-10-CM

## 2021-10-31 DIAGNOSIS — E871 Hypo-osmolality and hyponatremia: Secondary | ICD-10-CM | POA: Diagnosis not present

## 2021-10-31 DIAGNOSIS — I2602 Saddle embolus of pulmonary artery with acute cor pulmonale: Secondary | ICD-10-CM | POA: Diagnosis not present

## 2021-10-31 DIAGNOSIS — R7881 Bacteremia: Secondary | ICD-10-CM | POA: Diagnosis not present

## 2021-10-31 DIAGNOSIS — G894 Chronic pain syndrome: Secondary | ICD-10-CM | POA: Diagnosis not present

## 2021-10-31 NOTE — Progress Notes (Signed)
Pharmacy Antibiotic Note  Latoya [name] is a 36 y.o. female admitted on 10/11/2021 with CP and back pain. Pt noted to have acute PE. Pt has significant history of MSSA and MRSA bacteremia c/b endocarditis and lumbar discitis. Pt recently completed 6 weeks of vancomycin and was started on linezolid after. Pharmacy has been consulted for vancomycin. Micafungin ordered as well with C. Albicans growing in blood. ID following.  -WBC= 18.2, SCr= 0.6    Plan: - Continue Vancomycin 1250 mg IV every 12 hours - Continue Micafungin 150 mg IV every 24 hours - Will monitor renal function, plan to check weekly levels  10/31/2021 12:41 PM   Height: 5\' 6"  (167.6 cm) Weight: 85.6 kg (188 lb 11.4 oz) IBW/kg (Calculated) : 59.3  Temp (24hrs), Avg:98.7 F (37.1 C), Min:98 F (36.7 C), Max:99.2 F (37.3 C)  Recent Labs  Lab 10/25/21 0449 10/27/21 0210 10/27/21 0954 10/29/21 0412 10/30/21 0554  WBC 16.2* 15.3*  --  18.5* 18.2*  CREATININE 0.58 0.69  --  0.57 0.65  VANCOTROUGH  --   --  11*  --   --   VANCOPEAK  --  26*  --   --   --      Estimated Creatinine Clearance: 109.2 mL/min (by C-G formula based on SCr of 0.65 mg/dL).    Allergies  Allergen Reactions   Naltrexone Anxiety    Severe anxiety Restlessness  Vomiting     Antimicrobials this admission: Vancomycin 6/7 >>  Cefepime 6/7 >> 6/12 Micafungin 6/8 >>  Microbiology results: 6/7 BCx: C. Albicans 6/7 MRSA PCR: negative 6/9 resp- normal flora 6/10 blood x2- neg  Thank you for allowing pharmacy to be a part of this patient's care.  Harland German, PharmD Clinical Pharmacist **Pharmacist phone directory can now be found on [email or web address] ([email or web address]).  Listed under [facility].

## 2021-10-31 NOTE — Plan of Care (Signed)

## 2021-11-01 DIAGNOSIS — B377 Candidal sepsis: Secondary | ICD-10-CM | POA: Diagnosis not present

## 2021-11-01 DIAGNOSIS — E871 Hypo-osmolality and hyponatremia: Secondary | ICD-10-CM | POA: Diagnosis not present

## 2021-11-01 DIAGNOSIS — M4646 Discitis, unspecified, lumbar region: Secondary | ICD-10-CM | POA: Diagnosis not present

## 2021-11-01 DIAGNOSIS — I079 Rheumatic tricuspid valve disease, unspecified: Secondary | ICD-10-CM | POA: Diagnosis not present

## 2021-11-01 DIAGNOSIS — I2692 Saddle embolus of pulmonary artery without acute cor pulmonale: Secondary | ICD-10-CM | POA: Diagnosis not present

## 2021-11-01 MED ORDER — MELATONIN 3 MG PO TABS
3.0000 mg | ORAL_TABLET | Freq: Every day | ORAL | Status: DC
  Administered 2021-11-01 – 2021-11-25 (×25): 3 mg via ORAL
  Filled 2021-11-01 (×25): qty 1

## 2021-11-01 MED ORDER — BENZONATATE 100 MG PO CAPS
200.0000 mg | ORAL_CAPSULE | Freq: Three times a day (TID) | ORAL | Status: DC
  Administered 2021-11-01 – 2021-11-12 (×34): 200 mg via ORAL
  Filled 2021-11-01 (×35): qty 2

## 2021-11-01 NOTE — Progress Notes (Signed)
[facility]                                                                              Latoya [name], is a 36 y.o. female, DOB - December 08, 1985, [id number] Admit date - 10/11/2021    Outpatient Primary MD for the patient is Pcp, No  LOS - 21  days  No chief complaint on file.      Brief summary   Latoya [name] is a 36 y.o. female with a history of MSSA tricuspid valve endocarditis, MRSA L4-L5 facet join septic discitis in 07/2019 and more recently diagnosis of MRSA bacteremia with infective endocarditis and tricuspid valve vegetation diagnosed 07/2021 who presented from [facility] for saddle pulmonary embolism.  Blood cultures growing candida albicans.  MRI L spine with findings concerning for septic arthritis.    ID is driving antimicrobial management, currently with vancomycin and micafungin.   Cardiothoracic surgery was consulted, noting evidence of PFO at previous Angiovac attempt, stating only option would be for surgical valve repair and PFO closure. They recommended outpatient follow up after sustained abstinence of illicit drugs.      Assessment & Plan    Principal Problem:   Acute saddle pulmonary embolism (HCC) -Failure to DOACs, developed while on eliquis.  No RV strain on echo -Per hematology, plan for indefinite Lovenox   Active Problems: Candidemia, tricuspid valve endocarditis, MRSA bacteremia, POA -Large vegetation. -CT VS consulted, recommended follow-up after discharge for consideration of surgical valve repair and PFO closure after sustained abstinence of illicit drugs -ID following.  Continue vancomycin, micafungin, will need prolonged antibiotics presumably another 3 weeks  -LUE PICC placed by IR 6/15, replaced 6/24 due to kinking.  Blood cultures cleared.   Lumbar discitis -Pain currently controlled with current regimen -continue management as above for IV antibiotics   Sepsis, multifactorial, POA with candidemia,  endocarditis, discitis -Continue antibiotics as above, sepsis physiology improving  Pneumonia left lower lobe HCAP versus septic emboli on prior admission -Continue antibiotics as above -Still has persistent leukocytosis   IVDU history, reports abstinence since March 2023 -Continue abstinence, counseled cessation   Normocytic anemia -H&H currently stable  Thrombocytopenia -Resolved   Chronic pain, pleuritic chest pain -Continue Neurontin   Chronic pain/pleuritic chest pain:  -Continue gabapentin   Mild hyponatremia -Resolved   Obesity Estimated body mass index is 30.46 kg/m as calculated from the following:   Height as of this encounter: 5\' 6"  (1.676 m).   Weight as of this encounter: 85.6 kg.  Code Status: Full CODE STATUS DVT Prophylaxis:     Level of Care: Level of care: Med-Surg Family Communication: Updated patient Disposition Plan:      Remains inpatient appropriate: On IV antibiotics  Procedures:   Consultants:   CT VS ID  Antimicrobials:    Medications  benzonatate  200 mg Oral TID   Chlorhexidine Gluconate Cloth  6 each Topical Daily   diltiazem  60 mg Oral Q8H   enoxaparin (LOVENOX) injection  150 mg Subcutaneous Q24H   ferrous gluconate  324 mg Oral Q breakfast   gabapentin  400 mg Oral TID   melatonin  3 mg  Oral QHS   metoprolol tartrate  25 mg Oral BID   oxyCODONE  10 mg Oral Q12H   pantoprazole  40 mg Oral Daily   pneumococcal 20-valent conjugate vaccine  0.5 mL Intramuscular Tomorrow-1000   sodium chloride flush  10-40 mL Intracatheter Q12H      Subjective:   Latoya [name] was seen and examined today.  Complaining of cough, did not sleep well.  Pain otherwise controlled.  No acute issues overnight.  No fevers.  Objective:   Vitals:   10/31/21 1359 10/31/21 2000 10/31/21 2152 11/01/21 0458  BP: 101/80 106/83 106/83 106/78  Pulse:  80 84 84  Resp:  16  18  Temp:  98.9 F (37.2 C)  97.7 F (36.5 C)  TempSrc:  Oral  Oral   SpO2:  97%  99%  Weight:      Height:       No intake or output data in the 24 hours ending 11/01/21 1003   Wt Readings from Last 3 Encounters:  10/31/21 85.6 kg  10/03/21 84 kg  09/26/21 82.6 kg     Exam General: Alert and oriented x 3, NAD, ill-appearing Cardiovascular: S1 S2 auscultated,  RRR Respiratory: Clear to auscultation bilaterally, no wheezing Gastrointestinal: Soft, nontender, nondistended, + bowel sounds Ext: no pedal edema bilaterally Neuro: Strength 5/5 upper and lower extremities bilaterally Psych: Normal affect and demeanor, alert and oriented x3     Data Reviewed:  I have personally reviewed following labs    CBC Lab Results  Component Value Date   WBC 18.2 (H) 10/30/2021   RBC 3.69 (L) 10/30/2021   HGB 8.8 (L) 10/30/2021   HCT 30.6 (L) 10/30/2021   MCV 82.9 10/30/2021   MCH 23.8 (L) 10/30/2021   PLT 439 (H) 10/30/2021   MCHC 28.8 (L) 10/30/2021   RDW 24.9 (H) 10/30/2021   LYMPHSABS 3.7 10/29/2021   MONOABS 1.3 (H) 10/29/2021   EOSABS 0.9 (H) 10/29/2021   BASOSABS 0.0 10/29/2021     Last metabolic panel Lab Results  Component Value Date   NA 135 10/30/2021   K 4.0 10/30/2021   CL 103 10/30/2021   CO2 22 10/30/2021   BUN 6 10/30/2021   CREATININE 0.65 10/30/2021   GLUCOSE 118 (H) 10/30/2021   GFRNONAA >60 10/30/2021   GFRAA >60 08/07/2019   CALCIUM 8.1 (L) 10/30/2021   PHOS 3.6 10/25/2021   PROT 7.5 10/25/2021   ALBUMIN 2.1 (L) 10/25/2021   BILITOT 0.2 (L) 10/25/2021   ALKPHOS 57 10/25/2021   AST 13 (L) 10/25/2021   ALT 7 10/25/2021   ANIONGAP 10 10/30/2021     Pawel Soules M.D. [facility] 11/01/2021, 10:03 AM  Available via Epic secure chat 7am-7pm After 7 pm, please refer to night coverage provider listed on amion.

## 2021-11-01 NOTE — Plan of Care (Signed)

## 2021-11-02 ENCOUNTER — Inpatient Hospital Stay (HOSPITAL_COMMUNITY): Payer: Medicaid Other

## 2021-11-02 DIAGNOSIS — J90 Pleural effusion, not elsewhere classified: Secondary | ICD-10-CM | POA: Diagnosis not present

## 2021-11-02 DIAGNOSIS — B377 Candidal sepsis: Secondary | ICD-10-CM | POA: Diagnosis not present

## 2021-11-02 DIAGNOSIS — G894 Chronic pain syndrome: Secondary | ICD-10-CM | POA: Diagnosis not present

## 2021-11-02 DIAGNOSIS — R059 Cough, unspecified: Secondary | ICD-10-CM | POA: Diagnosis not present

## 2021-11-02 DIAGNOSIS — J811 Chronic pulmonary edema: Secondary | ICD-10-CM | POA: Diagnosis not present

## 2021-11-02 DIAGNOSIS — I079 Rheumatic tricuspid valve disease, unspecified: Secondary | ICD-10-CM | POA: Diagnosis not present

## 2021-11-02 DIAGNOSIS — I2692 Saddle embolus of pulmonary artery without acute cor pulmonale: Secondary | ICD-10-CM | POA: Diagnosis not present

## 2021-11-02 DIAGNOSIS — R0602 Shortness of breath: Secondary | ICD-10-CM | POA: Diagnosis not present

## 2021-11-02 DIAGNOSIS — E871 Hypo-osmolality and hyponatremia: Secondary | ICD-10-CM | POA: Diagnosis not present

## 2021-11-02 LAB — CBC
HCT: 33.6 % — ABNORMAL LOW (ref 36.0–46.0)
Hemoglobin: 9.4 g/dL — ABNORMAL LOW (ref 12.0–15.0)
MCH: 23.9 pg — ABNORMAL LOW (ref 26.0–34.0)
MCHC: 28 g/dL — ABNORMAL LOW (ref 30.0–36.0)
MCV: 85.5 fL (ref 80.0–100.0)
Platelets: 383 10*3/uL (ref 150–400)
RBC: 3.93 MIL/uL (ref 3.87–5.11)
RDW: 25.8 % — ABNORMAL HIGH (ref 11.5–15.5)
WBC: 13.5 10*3/uL — ABNORMAL HIGH (ref 4.0–10.5)
nRBC: 1.9 % — ABNORMAL HIGH (ref 0.0–0.2)

## 2021-11-02 LAB — BASIC METABOLIC PANEL
Anion gap: 9 (ref 5–15)
BUN: 10 mg/dL (ref 6–20)
CO2: 23 mmol/L (ref 22–32)
Calcium: 8.2 mg/dL — ABNORMAL LOW (ref 8.9–10.3)
Chloride: 103 mmol/L (ref 98–111)
Creatinine, Ser: 0.57 mg/dL (ref 0.44–1.00)
GFR, Estimated: >60 mL/min (ref >60)
Glucose, Bld: 113 mg/dL — ABNORMAL HIGH (ref 70–99)
Potassium: 4.5 mmol/L (ref 3.5–5.1)
Sodium: 135 mmol/L (ref 135–145)

## 2021-11-02 LAB — BRAIN NATRIURETIC PEPTIDE: B Natriuretic Peptide: 464.1 pg/mL — ABNORMAL HIGH (ref 0.0–100.0)

## 2021-11-02 LAB — HEPARIN ANTI-XA: Heparin LMW: 0.43 IU/mL

## 2021-11-02 MED ORDER — DILTIAZEM HCL ER COATED BEADS 180 MG PO CP24
180.0000 mg | ORAL_CAPSULE | Freq: Every day | ORAL | Status: DC
  Administered 2021-11-02 – 2021-11-08 (×7): 180 mg via ORAL
  Filled 2021-11-02 (×7): qty 1

## 2021-11-02 MED ORDER — FONDAPARINUX SODIUM 10 MG/0.8ML ~~LOC~~ SOLN
10.0000 mg | Freq: Every day | SUBCUTANEOUS | Status: DC
  Administered 2021-11-02: 10 mg via SUBCUTANEOUS
  Filled 2021-11-02: qty 0.8

## 2021-11-02 MED ORDER — FUROSEMIDE 10 MG/ML IJ SOLN
40.0000 mg | Freq: Once | INTRAMUSCULAR | Status: AC
  Administered 2021-11-02: 40 mg via INTRAVENOUS
  Filled 2021-11-02: qty 4

## 2021-11-02 NOTE — Progress Notes (Signed)
[facility]                                                                              Latoya [name], is a 36 y.o. female, DOB - 27-Dec-1985, ENI:[id number] Admit date - 10/11/2021    Outpatient Primary MD for the patient is Pcp, No  LOS - 22  days  No chief complaint on file.      Brief summary   Latoya [name] is a 36 y.o. female with a history of MSSA tricuspid valve endocarditis, MRSA L4-L5 facet join septic discitis in 07/2019 and more recently diagnosis of MRSA bacteremia with infective endocarditis and tricuspid valve vegetation diagnosed 07/2021 who presented from [facility] for saddle pulmonary embolism.  Blood cultures growing candida albicans.  MRI L spine with findings concerning for septic arthritis.    ID is driving antimicrobial management, currently with vancomycin and micafungin.   Cardiothoracic surgery was consulted, noting evidence of PFO at previous Angiovac attempt, stating only option would be for surgical valve repair and PFO closure. They recommended outpatient follow up after sustained abstinence of illicit drugs.      Assessment & Plan    Principal Problem:   Acute saddle pulmonary embolism (HCC) -Failure to DOACs, developed while on eliquis.  No RV strain on echo -Per hematology, continue indefinite Lovenox   Active Problems: Candidemia, tricuspid valve endocarditis, MRSA bacteremia, POA -Large vegetation. -CT VS consulted, recommended follow-up after discharge for consideration of surgical valve repair and PFO closure after sustained abstinence of illicit drugs -ID following.  Continue vancomycin, micafungin, will need prolonged antibiotics presumably another 3 weeks  -LUE PICC placed by IR 6/15, replaced 6/24 due to kinking.  Blood cultures cleared.  Shortness of breath -Patient was noted to be somewhat short of breath this morning -Stat BNP 464, chest x-ray showed cardiomegaly, multifocal pneumonia, small bilateral  pleural effusions -Will give 1 dose of Lasix 40 mg IV -Strict I's and O's and daily weights  Lumbar discitis -Pain currently controlled with current regimen -continue management as above for IV antibiotics   Sepsis, multifactorial, POA with candidemia, endocarditis, discitis -Continue antibiotics as above, sepsis physiology improving -Leukocytosis improving  Pneumonia left lower lobe HCAP versus septic emboli on prior admission -Chest x-ray today suggesting multifocal pneumonia, continue IV antibiotics   IVDU history, reports abstinence since March 2023 -Continue abstinence, counseled cessation   Normocytic anemia -H&H currently stable  Thrombocytopenia -Resolved   Chronic pain, pleuritic chest pain -Continue Neurontin   Chronic pain/pleuritic chest pain:  -Continue gabapentin   Mild hyponatremia -Resolved   Obesity Estimated body mass index is 30.46 kg/m as calculated from the following:   Height as of this encounter: 5\' 6"  (1.676 m).   Weight as of this encounter: 85.6 kg.  Code Status: Full CODE STATUS DVT Prophylaxis:     Level of Care: Level of care: Med-Surg Family Communication: Updated patient Disposition Plan:      Remains inpatient appropriate: On IV antibiotics  Procedures:   Consultants:   CT VS ID  Antimicrobials:    Medications  benzonatate  200 mg Oral TID   Chlorhexidine Gluconate Cloth  6  each Topical Daily   diltiazem  180 mg Oral Daily   enoxaparin (LOVENOX) injection  150 mg Subcutaneous Q24H   ferrous gluconate  324 mg Oral Q breakfast   furosemide  40 mg Intravenous Once   gabapentin  400 mg Oral TID   melatonin  3 mg Oral QHS   metoprolol tartrate  25 mg Oral BID   oxyCODONE  10 mg Oral Q12H   pantoprazole  40 mg Oral Daily   pneumococcal 20-valent conjugate vaccine  0.5 mL Intramuscular Tomorrow-1000   sodium chloride flush  10-40 mL Intracatheter Q12H      Subjective:   Latoya [name] was seen and examined today.   Complaining of shortness of breath, appears to be somewhat anxious as well.  No acute fevers, cough is improving today.  Objective:   Vitals:   11/01/21 0458 11/01/21 1513 11/01/21 2100 11/02/21 0306  BP: 106/78 107/84 138/81 125/81  Pulse: 84 85 93 83  Resp: 18 18 18 18   Temp: 97.7 F (36.5 C) 97.7 F (36.5 C) 98.1 F (36.7 C) 97.6 F (36.4 C)  TempSrc: Oral Oral Oral Oral  SpO2: 99% 93% 96% 98%  Weight:      Height:       No intake or output data in the 24 hours ending 11/02/21 1135   Wt Readings from Last 3 Encounters:  10/31/21 85.6 kg  10/03/21 84 kg  09/26/21 82.6 kg   Physical Exam General: Alert and oriented x 3, NAD Cardiovascular: S1 S2 clear, RRR. No pedal edema b/l Respiratory: Diminished breath sound at the bases Gastrointestinal: Soft, nontender, nondistended, NBS Ext: no pedal edema bilaterally Neuro: no new deficits Psych: appears to be somewhat anxious   Data Reviewed:  I have personally reviewed following labs    CBC Lab Results  Component Value Date   WBC 13.5 (H) 11/02/2021   RBC 3.93 11/02/2021   HGB 9.4 (L) 11/02/2021   HCT 33.6 (L) 11/02/2021   MCV 85.5 11/02/2021   MCH 23.9 (L) 11/02/2021   PLT 383 11/02/2021   MCHC 28.0 (L) 11/02/2021   RDW 25.8 (H) 11/02/2021   LYMPHSABS 3.7 10/29/2021   MONOABS 1.3 (H) 10/29/2021   EOSABS 0.9 (H) 10/29/2021   BASOSABS 0.0 10/29/2021     Last metabolic panel Lab Results  Component Value Date   NA 135 11/02/2021   K 4.5 11/02/2021   CL 103 11/02/2021   CO2 23 11/02/2021   BUN 10 11/02/2021   CREATININE 0.57 11/02/2021   GLUCOSE 113 (H) 11/02/2021   GFRNONAA >60 11/02/2021   GFRAA >60 08/07/2019   CALCIUM 8.2 (L) 11/02/2021   PHOS 3.6 10/25/2021   PROT 7.5 10/25/2021   ALBUMIN 2.1 (L) 10/25/2021   BILITOT 0.2 (L) 10/25/2021   ALKPHOS 57 10/25/2021   AST 13 (L) 10/25/2021   ALT 7 10/25/2021   ANIONGAP 9 11/02/2021     Therron Sells M.D. [facility] 11/02/2021, 11:35  AM  Available via Epic secure chat 7am-7pm After 7 pm, please refer to night coverage provider listed on amion.

## 2021-11-02 NOTE — Progress Notes (Signed)
ANTICOAGULATION CONSULT NOTE - Follow Up Consult  Pharmacy Consult for Enoxaparin Indication: pulmonary embolus  Allergies  Allergen Reactions   Naltrexone Anxiety    Severe anxiety Restlessness  Vomiting     Patient Measurements: Height: 5\' 6"  (167.6 cm) Weight: 85.6 kg (188 lb 11.4 oz) IBW/kg (Calculated) : 59.3 Heparin Dosing Weight: 76.7 kg  Vital Signs: Temp: 97.8 F (36.6 C) (06/29 1455) Temp Source: Tympanic (06/29 1455) BP: 91/64 (06/29 2119) Pulse Rate: 84 (06/29 2119)  Labs: Recent Labs    11/02/21 0436 11/02/21 2111  HGB 9.4*  --   HCT 33.6*  --   PLT 383  --   HEPRLOWMOCWT  --  0.43  CREATININE 0.57  --      Estimated Creatinine Clearance: 109.2 mL/min (by C-G formula based on SCr of 0.57 mg/dL).   Medications:  Scheduled:   benzonatate  200 mg Oral TID   Chlorhexidine Gluconate Cloth  6 each Topical Daily   diltiazem  180 mg Oral Daily   enoxaparin (LOVENOX) injection  150 mg Subcutaneous Q24H   ferrous gluconate  324 mg Oral Q breakfast   gabapentin  400 mg Oral TID   melatonin  3 mg Oral QHS   metoprolol tartrate  25 mg Oral BID   oxyCODONE  10 mg Oral Q12H   pantoprazole  40 mg Oral Daily   pneumococcal 20-valent conjugate vaccine  0.5 mL Intramuscular Tomorrow-1000   sodium chloride flush  10-40 mL Intracatheter Q12H   Infusions:   sodium chloride 10 mL/hr at 11/02/21 1019   micafungin (MYCAMINE) 150 mg in sodium chloride 0.9 % 100 mL IVPB 150 mg (11/02/21 1655)   vancomycin 1,250 mg (11/02/21 2102)    Assessment: 35 yo F transferred from OSH with PE. Pt started on IV heparin drip. Pt has hx septic pulmonary emboli and reports adherence to PTA Eliquis. Hematology recommended enoxaparin for PE treatment and pharmacy dosing.  -anti-Xa level= 0.43 at 9:11pm (4 hours post dose) after an increase from 130mg /day to 150mg /day. This is very low for a peak and last level was 0.48. I think a peak of 1-2 will be hard to obtain and I also worry  about an adequate trough on a 24 hour regimen -Spoke with Dr. 20: Will change to Arixtra.     Goal of Therapy:  Anti-Xa level 1-2 units/ml 4hr after LMWH dose given  Monitor platelets by anticoagulation protocol: Yes   Plan:  -Change to Arixtra 10mg  [place] q24hr -The anti-Xa levels here are not calibrated to check Arixtra so we will not be able to check levels (might be a send out but results would take some time). The cost of Arixtra could also be prohibitive to the patient -Would recommend consulting with hematology on other possible options  , PharmD Clinical Pharmacist **Pharmacist phone directory can now be found on [email or web address] ([email or web address]).  Listed under [facility].

## 2021-11-02 NOTE — Plan of Care (Signed)
  Problem: Education: Goal: Knowledge of General Education information will improve Description Including pain rating scale, medication(s)/side effects and non-pharmacologic comfort measures Outcome: Progressing   

## 2021-11-03 ENCOUNTER — Other Ambulatory Visit (HOSPITAL_COMMUNITY): Payer: Self-pay

## 2021-11-03 DIAGNOSIS — G8921 Chronic pain due to trauma: Secondary | ICD-10-CM | POA: Diagnosis not present

## 2021-11-03 DIAGNOSIS — E871 Hypo-osmolality and hyponatremia: Secondary | ICD-10-CM | POA: Diagnosis not present

## 2021-11-03 DIAGNOSIS — I079 Rheumatic tricuspid valve disease, unspecified: Secondary | ICD-10-CM | POA: Diagnosis not present

## 2021-11-03 DIAGNOSIS — B377 Candidal sepsis: Secondary | ICD-10-CM | POA: Diagnosis not present

## 2021-11-03 DIAGNOSIS — I2602 Saddle embolus of pulmonary artery with acute cor pulmonale: Secondary | ICD-10-CM | POA: Diagnosis not present

## 2021-11-03 LAB — VANCOMYCIN, PEAK: Vancomycin Pk: >60 ug/mL (ref 30–40)

## 2021-11-03 LAB — VANCOMYCIN, TROUGH: Vancomycin Tr: 18 ug/mL (ref 15–20)

## 2021-11-03 MED ORDER — PRADAXA 150 MG PO CAPS: 150.0000 mg | ORAL_CAPSULE | Freq: Two times a day (BID) | ORAL | 11 refills | Status: DC

## 2021-11-03 MED ORDER — DABIGATRAN ETEXILATE MESYLATE 150 MG PO CAPS
150.0000 mg | ORAL_CAPSULE | Freq: Two times a day (BID) | ORAL | Status: DC
  Administered 2021-11-03 – 2021-11-26 (×46): 150 mg via ORAL
  Filled 2021-11-03 (×47): qty 1

## 2021-11-03 MED ORDER — VANCOMYCIN VARIABLE DOSE PER UNSTABLE RENAL FUNCTION (PHARMACIST DOSING)
Status: DC
Start: 2021-11-03 — End: 2021-11-04

## 2021-11-03 MED ORDER — SODIUM CHLORIDE 0.9 % IV SOLN
2.0000 g | Freq: Three times a day (TID) | INTRAVENOUS | Status: DC
  Administered 2021-11-03 – 2021-11-06 (×9): 2 g via INTRAVENOUS
  Filled 2021-11-03 (×9): qty 12.5

## 2021-11-03 MED ORDER — GUAIFENESIN ER 600 MG PO TB12
1200.0000 mg | ORAL_TABLET | Freq: Two times a day (BID) | ORAL | Status: DC
  Administered 2021-11-03 – 2021-11-12 (×19): 1200 mg via ORAL
  Filled 2021-11-03 (×19): qty 2

## 2021-11-03 MED ORDER — LORAZEPAM 2 MG/ML IJ SOLN
1.0000 mg | Freq: Once | INTRAMUSCULAR | Status: AC
  Administered 2021-11-03: 1 mg via INTRAVENOUS
  Filled 2021-11-03: qty 1

## 2021-11-03 MED ORDER — FUROSEMIDE 10 MG/ML IJ SOLN
40.0000 mg | Freq: Two times a day (BID) | INTRAMUSCULAR | Status: DC
  Administered 2021-11-03 (×2): 40 mg via INTRAVENOUS
  Filled 2021-11-03 (×2): qty 4

## 2021-11-03 MED ORDER — DABIGATRAN ETEXILATE MESYLATE 150 MG PO CAPS
150.0000 mg | ORAL_CAPSULE | Freq: Two times a day (BID) | ORAL | 0 refills | Status: DC
  Filled 2021-11-03: qty 60, 30d supply, fill #0

## 2021-11-03 NOTE — TOC Benefit Eligibility Note (Signed)
Patient Product/process development scientist completed.    The patient is currently admitted and upon discharge could be taking Pradaxa 150 mg Capsules.  The current 30 day co-pay is, $0.00.   The patient is insured through [employer] [place] Medicaid     Roland Earl, CPhT Pharmacy Patient Advocate Specialist [facility] Patient Advocate Team Direct Number: [phone]  Fax: [phone]

## 2021-11-03 NOTE — Progress Notes (Addendum)
ANTICOAGULATION CONSULT NOTE - Follow Up Consult  Pharmacy Consult for Enoxaparin Indication: pulmonary embolus  Allergies  Allergen Reactions   Naltrexone Anxiety    Severe anxiety Restlessness  Vomiting     Patient Measurements: Height: 5\' 6"  (167.6 cm) Weight: 87.8 kg (193 lb 9.6 oz) IBW/kg (Calculated) : 59.3 Heparin Dosing Weight: 76.7 kg  Vital Signs: Temp: 97.9 F (36.6 C) (06/30 0511) Temp Source: Oral (06/30 0511) BP: 117/66 (06/30 0920) Pulse Rate: 94 (06/30 0920)  Labs: Recent Labs    11/02/21 0436 11/02/21 2111  HGB 9.4*  --   HCT 33.6*  --   PLT 383  --   HEPRLOWMOCWT  --  0.43  CREATININE 0.57  --      Estimated Creatinine Clearance: 110.6 mL/min (by C-G formula based on SCr of 0.57 mg/dL).  Assessment: 36 yo F transferred from OSH with BL PE with right heart strain 09/09/21. Pt has hx septic pulmonary emboli and reports adherence to PTA Eliquis. Started heparin infusion 6/7- 6/10 then switched to enoxaparin 1 mg/kg Q12 hr per Hematology recommendations. Enoxaparin was switched to 1.5mg /kg/d on 6/19 to help with compliance but anti-Xa levels remained low 6/23 and 6/29 despite dose increase. Patient received Fondaparinux 10mg  x1 on 6/29.   Spoke with patient 6/30 who stated that she will not give herself injections due to hx of IVDU and injection being a trigger for her. Discussed with Heme/Onc who suggested switching to Pradaxa. Discussed with TR[facility] who agreed. Patient's insurance will cover Brand name Pradaxa. Called [facility]'s [facility] 7[facility] Pharmacy who is willing to order Brand name Pradaxa for patient. [facility] Pharmacy has 1 month supply of Brand name Pradaxa.   Goal of Therapy:   Monitor platelets by anticoagulation protocol: Yes   Plan:  Stop fondaparinux Start Dabigatran 150mg  Q12hr, will likely continue indefinitely 6/30 Sent 30d Rx to [facility] to be delivered to bedside at discharge and sent Rx with refills to Pharmacy to be filled in 1  month later (so they have time to order medication) with [facility] approval    7/30, PharmD, BCPS, BCCP Clinical Pharmacist  Please check AMION for all [facility] Pharmacy phone numbers After 10:00 PM, call Main Pharmacy [phone]

## 2021-11-03 NOTE — Discharge Instructions (Addendum)
Refills for your Pradaxa have been sent to [facility]. Please call them at least ONE WEEK BEFORE you need refills so they can order the Pradaxa for you.   =========================================================== Information on my medicine - Pradaxa (dabigatran)  This medication education was reviewed with me or my healthcare representative as part of my discharge preparation.     Why was Pradaxa prescribed for you? Pradaxa was prescribed to treat blood clots that may have been found in the veins of your legs (deep vein thrombosis) or in your lungs (pulmonary embolism) and to reduce the risk of them occurring again.  Pradaxa will take the place of the injectable anticoagulant medication you have been receiving for this condition for the last 5-10 days.  What do you Need to know about PradAXa? Take your Pradaxa TWICE DAILY - one capsule in the morning and one tablet in the evening with or without food.  It would be best to take the doses about the same time each day.  The capsules should not be broken, chewed or opened - they must be swallowed whole.  Do not store Pradaxa in other medication containers - once the bottle is opened the Pradaxa should be used within FOUR months; throw away any capsules that haven't been by that time.  Take Pradaxa exactly as prescribed by your doctor.  DO NOT stop taking Pradaxa without talking to the doctor who prescribed the medication.  Refill your prescription before you run out.  After discharge, you should have regular check-up appointments with your healthcare provider that is prescribing your Pradaxa.  In the future your dose may need to be changed if your kidney function or weight changes by a significant amount.  What do you do if you miss a dose? If you miss a dose, take it as soon as you remember on the same day.  If your next dose is less than 6 hours away, skip the missed dose.  Do not take two doses of PRADAXA at the same  time.  Important Safety Information A possible side effect of Pradaxa is bleeding. You should call your healthcare provider right away if you experience any of the following: Bleeding from an injury or your nose that does not stop. Unusual colored urine (red or dark brown) or unusual colored stools (red or black). Unusual bruising for unknown reasons. A serious fall or if you hit your head (even if there is no bleeding).  Some medicines may interact with Pradaxa and might increase your risk of bleeding or clotting while on Pradaxa. To help avoid this, consult your healthcare provider or pharmacist prior to using any new prescription or non-prescription medications, including herbals, vitamins, non-steroidal anti-inflammatory drugs (NSAIDs) and supplements.  This website has more information on Pradaxa (dabigatran): www.HDTV[employer].dk.   ==============================================================  Pulmonary Embolism    A pulmonary embolism (PE) is a sudden blockage or decrease of blood flow in one or both lungs. Most blockages come from a blood clot that forms in the vein of a lower leg, thigh, or arm (deep vein thrombosis, DVT) and travels to the lungs. A clot is blood that has thickened into a gel or solid. PE is a dangerous and life-threatening condition that needs to be treated right away.  What are the causes? This condition is usually caused by a blood clot that forms in a vein and moves to the lungs. In rare cases, it may be caused by air, fat, part of a tumor, or other tissue that moves through  the veins and into the lungs.  What increases the risk? The following factors may make you more likely to develop this condition: Experiencing a traumatic injury, such as breaking a hip or leg. Having: A spinal cord injury. Orthopedic surgery, especially hip or knee replacement. Any major surgery. A stroke. DVT. Blood clots or blood clotting disease. Long-term (chronic) lung or  heart disease. Cancer treated with chemotherapy. A central venous catheter. Taking medicines that contain estrogen. These include birth control pills and hormone replacement therapy. Being: Pregnant. In the period of time after your baby is delivered (postpartum). Older than age 44. Overweight. A smoker, especially if you have other risks.  What are the signs or symptoms? Symptoms of this condition usually start suddenly and include: Shortness of breath during activity or at rest. Coughing, coughing up blood, or coughing up blood-tinged mucus. Chest pain that is often worse with deep breaths. Rapid or irregular heartbeat. Feeling light-headed or dizzy. Fainting. Feeling anxious. Fever. Sweating. Pain and swelling in a leg. This is a symptom of DVT, which can lead to PE. How is this diagnosed? This condition may be diagnosed based on: Your medical history. A physical exam. Blood tests. CT pulmonary angiogram. This test checks blood flow in and around your lungs. Ventilation-perfusion scan, also called a lung VQ scan. This test measures air flow and blood flow to the lungs. An ultrasound of the legs.  How is this treated? Treatment for this condition depends on many factors, such as the cause of your PE, your risk for bleeding or developing more clots, and other medical conditions you have. Treatment aims to remove, dissolve, or stop blood clots from forming or growing larger. Treatment may include: Medicines, such as: Blood thinning medicines (anticoagulants) to stop clots from forming. Medicines that dissolve clots (thrombolytics). Procedures, such as: Using a flexible tube to remove a blood clot (embolectomy) or to deliver medicine to destroy it (catheter-directed thrombolysis). Inserting a filter into a large vein that carries blood to the heart (inferior vena cava). This filter (vena cava filter) catches blood clots before they reach the lungs. Surgery to remove the clot  (surgical embolectomy). This is rare. You may need a combination of immediate, long-term (up to 3 months after diagnosis), and extended (more than 3 months after diagnosis) treatments. Your treatment may continue for several months (maintenance therapy). You and your health care provider will work together to choose the treatment program that is best for you.  Follow these instructions at home: Medicines Take over-the-counter and prescription medicines only as told by your health care provider. If you are taking an anticoagulant medicine: Take the medicine every day at the same time each day. Understand what foods and drugs interact with your medicine. Understand the side effects of this medicine, including excessive bruising or bleeding. Ask your health care provider or pharmacist about other side effects.  General instructions Wear a medical alert bracelet or carry a medical alert card that says you have had a PE and lists what medicines you take. Ask your health care provider when you may return to your normal activities. Avoid sitting or lying for a long time without moving. Maintain a healthy weight. Ask your health care provider what weight is healthy for you. Do not use any products that contain nicotine or tobacco, such as cigarettes, e-cigarettes, and chewing tobacco. If you need help quitting, ask your health care provider. Talk with your health care provider about any travel plans. It is important to make sure  that you are still able to take your medicine while on trips. Keep all follow-up visits as told by your health care provider. This is important.  Contact a health care provider if: You missed a dose of your blood thinner medicine.  Get help right away if: You have: New or increased pain, swelling, warmth, or redness in an arm or leg. Numbness or tingling in an arm or leg. Shortness of breath during activity or at rest. A fever. Chest pain. A rapid or irregular  heartbeat. A severe headache. Vision changes. A serious fall or accident, or you hit your head. Stomach (abdominal) pain. Blood in your vomit, stool, or urine. A cut that will not stop bleeding. You cough up blood. You feel light-headed or dizzy. You cannot move your arms or legs. You are confused or have memory loss.  These symptoms may represent a serious problem that is an emergency. Do not wait to see if the symptoms will go away. Get medical help right away. Call your local emergency services (911 in the [place] Do not drive yourself to the hospital. Summary A pulmonary embolism (PE) is a sudden blockage or decrease of blood flow in one or both lungs. PE is a dangerous and life-threatening condition that needs to be treated right away. Treatments for this condition usually include medicines to thin your blood (anticoagulants) or medicines to break apart blood clots (thrombolytics). If you are given blood thinners, it is important to take the medicine every day at the same time each day. Understand what foods and drugs interact with any medicines that you are taking. If you have signs of PE or DVT, call your local emergency services (911 in the [place] This information is not intended to replace advice given to you by your health care provider. Make sure you discuss any questions you have with your health care provider. Document Revised: 01/29/2018 Document Reviewed: 01/29/2018 Elsevier Patient Education  2020 [employer].

## 2021-11-03 NOTE — Progress Notes (Signed)
[facility]                                                                              Latoya [name], is a 36 y.o. female, DOB - May 27, 1985, [id number] Admit date - 10/11/2021    Outpatient Primary MD for the patient is Pcp, No  LOS - 23  days  No chief complaint on file.      Brief summary   Latoya [name] is a 36 y.o. female with a history of MSSA tricuspid valve endocarditis, MRSA L4-L5 facet join septic discitis in 07/2019 and more recently diagnosis of MRSA bacteremia with infective endocarditis and tricuspid valve vegetation diagnosed 07/2021 who presented from [facility] for saddle pulmonary embolism.  Blood cultures growing candida albicans.  MRI L spine with findings concerning for septic arthritis.    ID is driving antimicrobial management, currently with vancomycin and micafungin.   Cardiothoracic surgery was consulted, noting evidence of PFO at previous Angiovac attempt, stating only option would be for surgical valve repair and PFO closure. They recommended outpatient follow up after sustained abstinence of illicit drugs.      Assessment & Plan    Principal Problem:   Acute saddle pulmonary embolism (HCC) -Failure to DOACs, developed while on eliquis.  No RV strain on echo -Patient feels uncomfortable with subcu injections.  Pharmacy was able to communicate with hematology, Dr. Myna Hidalgo who recommended Pradaxa, will start today   Active Problems: Candidemia, tricuspid valve endocarditis, MRSA bacteremia, POA -Large vegetation. -CT VS consulted, recommended follow-up after discharge for consideration of surgical valve repair and PFO closure after sustained abstinence of illicit drugs -ID following.  Continue vancomycin, micafungin, will need prolonged antibiotics presumably another 3 weeks  -LUE PICC placed by IR 6/15, replaced 6/24 due to kinking.  Blood cultures cleared.  Shortness of breath, pulmonary edema with multifocal  pneumonia Also noted to have a component of acute anxiety -Stat BNP 464, chest x-ray 6/29 showed cardiomegaly, multifocal pneumonia, small bilateral pleural effusions -Placed on strict I's and O's and daily weights, positive balance of 8.3 L -Started on IV Lasix 40 mg every 12 hours, fluid restriction  -Continue antibiotics, currently on IV vancomycin and micafungin -Will add IV cefepime for multifocal pneumonia   Lumbar discitis -Pain currently controlled with current regimen -continue management as above for IV antibiotics   Sepsis, multifactorial, POA with candidemia, endocarditis, discitis -Sepsis physiology improving, Leukocytosis improving  Pneumonia left lower lobe HCAP versus septic emboli on prior admission -Chest x-ray today suggesting multifocal pneumonia, continue IV antibiotics   IVDU history, reports abstinence since March 2023 -Continue abstinence, counseled cessation   Normocytic anemia -H&H currently stable  Thrombocytopenia -Resolved   Chronic pain, pleuritic chest pain -Continue Neurontin   Chronic pain/pleuritic chest pain:  -Continue gabapentin   Mild hyponatremia -Resolved   Obesity Estimated body mass index is 31.25 kg/m as calculated from the following:   Height as of this encounter: 5\' 6"  (1.676 m).   Weight as of this encounter: 87.8 kg.  Code Status: Full CODE STATUS DVT Prophylaxis:   dabigatran (PRADAXA) capsule 150 mg   Level of Care: Level of care:  Med-Surg Family Communication: Updated patient and her father at the bedside Disposition Plan:      Remains inpatient appropriate: On IV antibiotics  Procedures:   Consultants:   CT VS ID  Antimicrobials:    Medications  benzonatate  200 mg Oral TID   Chlorhexidine Gluconate Cloth  6 each Topical Daily   dabigatran  150 mg Oral Q12H   diltiazem  180 mg Oral Daily   ferrous gluconate  324 mg Oral Q breakfast   furosemide  40 mg Intravenous Q12H   gabapentin  400 mg Oral TID    guaiFENesin  1,200 mg Oral BID   LORazepam  1 mg Intravenous Once   melatonin  3 mg Oral QHS   metoprolol tartrate  25 mg Oral BID   oxyCODONE  10 mg Oral Q12H   pantoprazole  40 mg Oral Daily   pneumococcal 20-valent conjugate vaccine  0.5 mL Intramuscular Tomorrow-1000   sodium chloride flush  10-40 mL Intracatheter Q12H   vancomycin variable dose per unstable renal function (pharmacist dosing)   Does not apply See admin instructions      Subjective:   Latoya [name] was seen and examined today.  Complaining of shortness of breath although appears to be somewhat more comfortable from yesterday, had received Lasix 20 mg yesterday.  Coughing but no acute fevers or chills.  States she is bringing up phlegm.    Objective:   Vitals:   11/02/21 1455 11/02/21 2119 11/03/21 0511 11/03/21 0920  BP: 109/78 91/64 113/75 117/66  Pulse: 84 84 94 94  Resp: 18  16   Temp: 97.8 F (36.6 C)  97.9 F (36.6 C)   TempSrc: Tympanic  Oral   SpO2: 92%  92%   Weight:   87.8 kg   Height:        Intake/Output Summary (Last 24 hours) at 11/03/2021 1418 Last data filed at 11/03/2021 1400 Gross per 24 hour  Intake 1376 ml  Output 500 ml  Net 876 ml     Wt Readings from Last 3 Encounters:  11/03/21 87.8 kg  10/03/21 84 kg  09/26/21 82.6 kg    Physical Exam General: Alert and oriented x 3, NAD, appears anxious Cardiovascular: S1 S2 clear, RRR Respiratory: Diminished breath sounds at the bases Gastrointestinal: Soft, nontender, nondistended, NBS Ext: trace pedal edema bilaterally Neuro: no new deficits   Data Reviewed:  I have personally reviewed following labs    CBC Lab Results  Component Value Date   WBC 13.5 (H) 11/02/2021   RBC 3.93 11/02/2021   HGB 9.4 (L) 11/02/2021   HCT 33.6 (L) 11/02/2021   MCV 85.5 11/02/2021   MCH 23.9 (L) 11/02/2021   PLT 383 11/02/2021   MCHC 28.0 (L) 11/02/2021   RDW 25.8 (H) 11/02/2021   LYMPHSABS 3.7 10/29/2021   MONOABS 1.3 (H) 10/29/2021    EOSABS 0.9 (H) 10/29/2021   BASOSABS 0.0 10/29/2021     Last metabolic panel Lab Results  Component Value Date   NA 135 11/02/2021   K 4.5 11/02/2021   CL 103 11/02/2021   CO2 23 11/02/2021   BUN 10 11/02/2021   CREATININE 0.57 11/02/2021   GLUCOSE 113 (H) 11/02/2021   GFRNONAA >60 11/02/2021   GFRAA >60 08/07/2019   CALCIUM 8.2 (L) 11/02/2021   PHOS 3.6 10/25/2021   PROT 7.5 10/25/2021   ALBUMIN 2.1 (L) 10/25/2021   BILITOT 0.2 (L) 10/25/2021   ALKPHOS 57 10/25/2021   AST 13 (L) 10/25/2021  ALT 7 10/25/2021   ANIONGAP 9 11/02/2021     Latoya [name] M.D. [facility] 11/03/2021, 2:18 PM  Available via Epic secure chat 7am-7pm After 7 pm, please refer to night coverage provider listed on amion.

## 2021-11-03 NOTE — Progress Notes (Signed)
Pharmacy Antibiotic Note  Latoya [name] is a 36 y.o. female admitted on 10/11/2021 with acute BL PE and TV endocarditis. Patient with history of MSSA and MRSA bacteremia c/b endocarditis and lumbar discitis. Pt recently completed 6 weeks of vancomycin and was started on linezolid after. Pharmacy has been consulted for vancomycin. Micafungin ordered as well with C. Albicans growing in blood. ID following.   6/30 Pharmacy consulted for cefepime for suspected PNA. Patient is coughing with new shortness of breath. CXR with worsening infiltrates. Patient with good renal function.    Vancomycin peak level drawn correctly ~1hr after dose infused is high at 60. Trough level pending at 21:00. Will discontinued scheduled vancomycin and dose per trough level.  Plan: F/u vancomycin trough and dose accordingly Start cefepime 2g Q 8 hr for PNA Continue Micafungin 150 mg IV every 24 hours per ID  Monitor cultures, clinical status, renal function, vancomycin level weekly  Narrow abx as able and f/u duration     Height: 5\' 6"  (167.6 cm) Weight: 87.8 kg (193 lb 9.6 oz) IBW/kg (Calculated) : 59.3  Temp (24hrs), Avg:97.9 F (36.6 C), Min:97.8 F (36.6 C), Max:97.9 F (36.6 C)  Recent Labs  Lab 10/29/21 0412 10/30/21 0554 11/02/21 0436 11/03/21 1241  WBC 18.5* 18.2* 13.5*  --   CREATININE 0.57 0.65 0.57  --   VANCOPEAK  --   --   --  >60*     Estimated Creatinine Clearance: 110.6 mL/min (by C-G formula based on SCr of 0.57 mg/dL).    Allergies  Allergen Reactions   Naltrexone Anxiety    Severe anxiety Restlessness  Vomiting     Antimicrobials this admission: Vancomycin 6/7 >>  Cefepime 6/7 >> 6/12; 6/30 >> Micafungin 6/8 >>  Microbiology results: 6/7 BCx: C. Albicans 6/7 MRSA PCR: negative 6/9 resp- normal flora 6/10 blood x2- neg  Thank you for allowing pharmacy to be a part of this patient's care.  8/9, PharmD, BCPS, BCCP Clinical Pharmacist  Please check AMION for  all [facility] Pharmacy phone numbers After 10:00 PM, call Main Pharmacy [phone]

## 2021-11-04 DIAGNOSIS — I079 Rheumatic tricuspid valve disease, unspecified: Secondary | ICD-10-CM | POA: Diagnosis not present

## 2021-11-04 DIAGNOSIS — I2692 Saddle embolus of pulmonary artery without acute cor pulmonale: Secondary | ICD-10-CM | POA: Diagnosis not present

## 2021-11-04 DIAGNOSIS — Z419 Encounter for procedure for purposes other than remedying health state, unspecified: Secondary | ICD-10-CM | POA: Diagnosis not present

## 2021-11-04 DIAGNOSIS — M4646 Discitis, unspecified, lumbar region: Secondary | ICD-10-CM | POA: Diagnosis not present

## 2021-11-04 DIAGNOSIS — E871 Hypo-osmolality and hyponatremia: Secondary | ICD-10-CM | POA: Diagnosis not present

## 2021-11-04 DIAGNOSIS — G8921 Chronic pain due to trauma: Secondary | ICD-10-CM | POA: Diagnosis not present

## 2021-11-04 DIAGNOSIS — G8929 Other chronic pain: Secondary | ICD-10-CM

## 2021-11-04 DIAGNOSIS — I2602 Saddle embolus of pulmonary artery with acute cor pulmonale: Secondary | ICD-10-CM | POA: Diagnosis not present

## 2021-11-04 DIAGNOSIS — B377 Candidal sepsis: Secondary | ICD-10-CM | POA: Diagnosis not present

## 2021-11-04 LAB — BASIC METABOLIC PANEL
Anion gap: 11 (ref 5–15)
BUN: 9 mg/dL (ref 6–20)
CO2: 24 mmol/L (ref 22–32)
Calcium: 8 mg/dL — ABNORMAL LOW (ref 8.9–10.3)
Chloride: 99 mmol/L (ref 98–111)
Creatinine, Ser: 0.7 mg/dL (ref 0.44–1.00)
GFR, Estimated: >60 mL/min (ref >60)
Glucose, Bld: 128 mg/dL — ABNORMAL HIGH (ref 70–99)
Potassium: 3.6 mmol/L (ref 3.5–5.1)
Sodium: 134 mmol/L — ABNORMAL LOW (ref 135–145)

## 2021-11-04 LAB — VANCOMYCIN, RANDOM: Vancomycin Rm: 11 ug/mL

## 2021-11-04 MED ORDER — VANCOMYCIN HCL IN DEXTROSE 1-5 GM/200ML-% IV SOLN
1000.0000 mg | Freq: Two times a day (BID) | INTRAVENOUS | Status: DC
  Administered 2021-11-04 – 2021-11-06 (×5): 1000 mg via INTRAVENOUS
  Filled 2021-11-04 (×6): qty 200

## 2021-11-04 MED ORDER — FUROSEMIDE 10 MG/ML IJ SOLN
40.0000 mg | Freq: Two times a day (BID) | INTRAMUSCULAR | Status: DC
Start: 2021-11-04 — End: 2021-11-07
  Administered 2021-11-04 – 2021-11-07 (×7): 40 mg via INTRAVENOUS
  Filled 2021-11-04 (×7): qty 4

## 2021-11-04 MED ORDER — VANCOMYCIN HCL IN DEXTROSE 1-5 GM/200ML-% IV SOLN
1000.0000 mg | Freq: Once | INTRAVENOUS | Status: AC
  Administered 2021-11-04: 1000 mg via INTRAVENOUS
  Filled 2021-11-04: qty 200

## 2021-11-04 NOTE — Progress Notes (Signed)
[facility]                                                                              Latoya [name], is a 36 y.o. female, DOB - 1986/03/22, [id number] Admit date - 10/11/2021    Outpatient Primary MD for the patient is Latoya [name]  LOS - 24  days  [name] chief complaint on file.      Brief summary   Latoya [name] is a 36 y.o. female with a history of MSSA tricuspid valve endocarditis, MRSA L4-L5 facet join septic discitis in 07/2019 and more recently diagnosis of MRSA bacteremia with infective endocarditis and tricuspid valve vegetation diagnosed 07/2021 who presented from [facility] for saddle pulmonary embolism.  Blood cultures growing candida albicans.  MRI L spine with findings concerning for septic arthritis.    ID is driving antimicrobial management, currently with vancomycin and micafungin.   Cardiothoracic surgery was consulted, noting evidence of PFO at previous Angiovac attempt, stating only option would be for surgical valve repair and PFO closure. They recommended outpatient follow up after sustained abstinence of illicit drugs.    Assessment & Plan    Principal Problem:   Acute saddle pulmonary embolism (HCC) -Failure to DOACs, developed while on eliquis.  [name] RV strain on echo -Patient feels uncomfortable with subcu injections.  Pharmacy was able to communicate with hematology, Dr. Myna [name] who recommended Pradaxa -Continue Pradaxa   Active Problems: Candidemia, tricuspid valve endocarditis, MRSA bacteremia, POA -Large vegetation. -CT VS consulted, recommended follow-up after discharge for consideration of surgical valve repair and PFO closure after sustained abstinence of illicit drugs -ID following.  Continue vancomycin, micafungin, will need prolonged antibiotics presumably another 3 weeks  -LUE PICC placed by IR 6/15, replaced 6/24 due to kinking.  Blood cultures cleared.  Shortness of breath, pulmonary edema with multifocal pneumonia Also  noted to have a component of acute anxiety -Placed on strict I's and O's, positive balance of 10.3 L, counseled patient on fluid restriction  -Continue IV Lasix 40 mg every 12 hours -Continue IV vancomycin, cefepime.  On IV micafungin -Renal function stable, monitor with diuresis  Lumbar discitis -Pain currently controlled with current regimen -continue management as above for IV antibiotics   Sepsis, multifactorial, POA with candidemia, endocarditis, discitis -Sepsis physiology improving, Leukocytosis improving  Pneumonia left lower lobe HCAP versus septic emboli on prior admission -Chest x-ray today suggesting multifocal pneumonia, continue IV antibiotics   IVDU history, reports abstinence since March 2023 -Continue abstinence, counseled cessation   Normocytic anemia -H&H currently stable  Thrombocytopenia -Resolved   Chronic pain, pleuritic chest pain -Continue Neurontin  Mild hyponatremia -Resolved, monitor with diuresis   Obesity Estimated body mass index is 31.23 kg/m as calculated from the following:   Height as of this encounter: 5\' 6"  (1.676 m).   Weight as of this encounter: 87.8 kg.  Code Status: Full CODE STATUS DVT Prophylaxis:   dabigatran (PRADAXA) capsule 150 mg   Level of Care: Level of care: Med-Surg Family Communication: Updated patient and her father at the bedside on 6/30 Disposition Plan:      Remains inpatient appropriate: On IV antibiotics  Procedures:   Consultants:   CT VS ID  Antimicrobials:    Medications  benzonatate  200 mg Oral TID   Chlorhexidine Gluconate Cloth  6 each Topical Daily   dabigatran  150 mg Oral Q12H   diltiazem  180 mg Oral Daily   ferrous gluconate  324 mg Oral Q breakfast   furosemide  40 mg Intravenous BID   gabapentin  400 mg Oral TID   guaiFENesin  1,200 mg Oral BID   melatonin  3 mg Oral QHS   metoprolol tartrate  25 mg Oral BID   oxyCODONE  10 mg Oral Q12H   pantoprazole  40 mg Oral Daily    pneumococcal 20-valent conjugate vaccine  0.5 mL Intramuscular Tomorrow-1000   sodium chloride flush  10-40 mL Intracatheter Q12H      Subjective:   Latoya [name] was seen and examined today.  + Cough but [name] significant chest pain or shortness of breath.  Appears more comfortable today.  [name] acute fever or chills, [name] acute events overnight  Objective:   Vitals:   11/03/21 2009 11/03/21 2300 11/04/21 0422 11/04/21 0500  BP: 112/81 102/79 114/82   Pulse: 84 80 79   Resp: 18 18 18    Temp: 97.9 F (36.6 C) 97.8 F (36.6 C) 97.8 F (36.6 C)   TempSrc: Oral Oral Oral   SpO2: 92% 97% 91%   Weight:    87.8 kg  Height:        Intake/Output Summary (Last 24 hours) at 11/04/2021 1220 Last data filed at 11/04/2021 0645 Gross per 24 hour  Intake 4617.57 ml  Output --  Net 4617.57 ml     Wt Readings from Last 3 Encounters:  11/04/21 87.8 kg  10/03/21 84 kg  09/26/21 82.6 kg   Physical Exam General: Alert and oriented x 3, NAD Cardiovascular: S1 S2 clear, RRR. Respiratory: Mild scattered rhonchi, diminished BS at the bases Gastrointestinal: Soft, nontender, nondistended, NBS Ext: trace pedal edema bilaterally Neuro: [name] new deficits Skin: [name] rashes Psych: Normal affect and demeanor, alert and oriented x3   Data Reviewed:  I have personally reviewed following labs    CBC Lab Results  Component Value Date   WBC 13.5 (H) 11/02/2021   RBC 3.93 11/02/2021   HGB 9.4 (L) 11/02/2021   HCT 33.6 (L) 11/02/2021   MCV 85.5 11/02/2021   MCH 23.9 (L) 11/02/2021   PLT 383 11/02/2021   MCHC 28.0 (L) 11/02/2021   RDW 25.8 (H) 11/02/2021   LYMPHSABS 3.7 10/29/2021   MONOABS 1.3 (H) 10/29/2021   EOSABS 0.9 (H) 10/29/2021   BASOSABS 0.0 10/29/2021     Last metabolic panel Lab Results  Component Value Date   NA 134 (L) 11/04/2021   K 3.6 11/04/2021   CL 99 11/04/2021   CO2 24 11/04/2021   BUN 9 11/04/2021   CREATININE 0.70 11/04/2021   GLUCOSE 128 (H) 11/04/2021   GFRNONAA  >60 11/04/2021   GFRAA >60 08/07/2019   CALCIUM 8.0 (L) 11/04/2021   PHOS 3.6 10/25/2021   PROT 7.5 10/25/2021   ALBUMIN 2.1 (L) 10/25/2021   BILITOT 0.2 (L) 10/25/2021   ALKPHOS 57 10/25/2021   AST 13 (L) 10/25/2021   ALT 7 10/25/2021   ANIONGAP 11 11/04/2021     Latoya [name] M.D. [facility] 11/04/2021, 12:20 PM  Available via Epic secure chat 7am-7pm After 7 pm, please refer to night coverage provider listed on amion.

## 2021-11-04 NOTE — Progress Notes (Signed)
Pharmacy Antibiotic Note  Latoya [name] is a 36 y.o. female admitted on 10/11/2021 with acute BL PE and TV endocarditis. Patient with history of MSSA and MRSA bacteremia c/b endocarditis and lumbar discitis.  Pharmacy has been consulted for vancomycin.   Levels drawn tonight indicate AUC slightly above goal--Vancomycin peak earlier today reported as > 60--suspect blood was drawn from PICC line where Vancomycin had been infused.    Plan: Vancomycin 1000 mg IV q12h    Height: 5\' 6"  (167.6 cm) Weight: 87.8 kg (193 lb 9.6 oz) IBW/kg (Calculated) : 59.3  Temp (24hrs), Avg:98.2 F (36.8 C), Min:97.8 F (36.6 C), Max:99.1 F (37.3 C)  Recent Labs  Lab 10/29/21 0412 10/30/21 0554 11/02/21 0436 11/03/21 1241 11/03/21 2040 11/04/21 0316  WBC 18.5* 18.2* 13.5*  --   --   --   CREATININE 0.57 0.65 0.57  --   --  0.70  VANCOTROUGH  --   --   --   --  18  --   VANCOPEAK  --   --   --  >60*  --   --   VANCORANDOM  --   --   --   --   --  11     Estimated Creatinine Clearance: 110.6 mL/min (by C-G formula based on SCr of 0.7 mg/dL).    Allergies  Allergen Reactions   Naltrexone Anxiety    Severe anxiety Restlessness  Vomiting     Antimicrobials this admission: Vancomycin 6/7 >>  Cefepime 6/7 >> 6/12; 6/30 >> Micafungin 6/8 >>  Microbiology results: 6/7 BCx: C. Albicans 6/7 MRSA PCR: negative 6/9 resp- normal flora 6/10 blood x2- neg  8/9, PharmD, BCPS

## 2021-11-05 DIAGNOSIS — I2602 Saddle embolus of pulmonary artery with acute cor pulmonale: Secondary | ICD-10-CM | POA: Diagnosis not present

## 2021-11-05 DIAGNOSIS — G8921 Chronic pain due to trauma: Secondary | ICD-10-CM | POA: Diagnosis not present

## 2021-11-05 DIAGNOSIS — G8929 Other chronic pain: Secondary | ICD-10-CM | POA: Diagnosis not present

## 2021-11-05 DIAGNOSIS — E871 Hypo-osmolality and hyponatremia: Secondary | ICD-10-CM | POA: Diagnosis not present

## 2021-11-05 DIAGNOSIS — B377 Candidal sepsis: Secondary | ICD-10-CM | POA: Diagnosis not present

## 2021-11-05 DIAGNOSIS — M4646 Discitis, unspecified, lumbar region: Secondary | ICD-10-CM | POA: Diagnosis not present

## 2021-11-05 DIAGNOSIS — I079 Rheumatic tricuspid valve disease, unspecified: Secondary | ICD-10-CM | POA: Diagnosis not present

## 2021-11-05 DIAGNOSIS — I2692 Saddle embolus of pulmonary artery without acute cor pulmonale: Secondary | ICD-10-CM | POA: Diagnosis not present

## 2021-11-05 LAB — CBC
HCT: 28.1 % — ABNORMAL LOW (ref 36.0–46.0)
Hemoglobin: 8.1 g/dL — ABNORMAL LOW (ref 12.0–15.0)
MCH: 24.5 pg — ABNORMAL LOW (ref 26.0–34.0)
MCHC: 28.8 g/dL — ABNORMAL LOW (ref 30.0–36.0)
MCV: 84.9 fL (ref 80.0–100.0)
Platelets: 213 10*3/uL (ref 150–400)
RBC: 3.31 MIL/uL — ABNORMAL LOW (ref 3.87–5.11)
RDW: 27.6 % — ABNORMAL HIGH (ref 11.5–15.5)
WBC: 10 10*3/uL (ref 4.0–10.5)
nRBC: 1.9 % — ABNORMAL HIGH (ref 0.0–0.2)

## 2021-11-05 LAB — BASIC METABOLIC PANEL
Anion gap: 8 (ref 5–15)
BUN: 8 mg/dL (ref 6–20)
CO2: 21 mmol/L — ABNORMAL LOW (ref 22–32)
Calcium: 6.5 mg/dL — ABNORMAL LOW (ref 8.9–10.3)
Chloride: 107 mmol/L (ref 98–111)
Creatinine, Ser: 0.6 mg/dL (ref 0.44–1.00)
GFR, Estimated: >60 mL/min (ref >60)
Glucose, Bld: 91 mg/dL (ref 70–99)
Potassium: 2.7 mmol/L — CL (ref 3.5–5.1)
Sodium: 136 mmol/L (ref 135–145)

## 2021-11-05 LAB — MAGNESIUM: Magnesium: 1.3 mg/dL — ABNORMAL LOW (ref 1.7–2.4)

## 2021-11-05 MED ORDER — MAGNESIUM SULFATE 50 % IJ SOLN
4.0000 g | Freq: Once | INTRAVENOUS | Status: AC
  Administered 2021-11-05: 4 g via INTRAVENOUS
  Filled 2021-11-05: qty 8

## 2021-11-05 MED ORDER — POTASSIUM CHLORIDE CRYS ER 20 MEQ PO TBCR
40.0000 meq | EXTENDED_RELEASE_TABLET | ORAL | Status: AC
  Administered 2021-11-05 (×2): 40 meq via ORAL
  Filled 2021-11-05 (×2): qty 2

## 2021-11-05 MED ORDER — POTASSIUM CHLORIDE CRYS ER 20 MEQ PO TBCR
40.0000 meq | EXTENDED_RELEASE_TABLET | Freq: Every day | ORAL | Status: DC
  Administered 2021-11-06 – 2021-11-16 (×11): 40 meq via ORAL
  Filled 2021-11-05 (×12): qty 2

## 2021-11-05 NOTE — Progress Notes (Signed)
[facility]                                                                              Latoya [name], is a 36 y.o. female, DOB - 06-May-1986, [id number] Admit date - 10/11/2021    Outpatient Primary MD for the patient is Pcp, No  LOS - 25  days       Brief summary   Latoya [name] is a 36 y.o. female with a history of MSSA tricuspid valve endocarditis, MRSA L4-L5 facet join septic discitis in 07/2019 and more recently diagnosis of MRSA bacteremia with infective endocarditis and tricuspid valve vegetation diagnosed 07/2021 who presented from [facility] for saddle pulmonary embolism.  Blood cultures growing candida albicans.  MRI L spine with findings concerning for septic arthritis.    ID is driving antimicrobial management, currently with vancomycin and micafungin.   Cardiothoracic surgery was consulted, noting evidence of PFO at previous Angiovac attempt, stating only option would be for surgical valve repair and PFO closure. They recommended outpatient follow up after sustained abstinence of illicit drugs.    Assessment & Plan    Principal Problem:   Acute saddle pulmonary embolism (HCC) -Failure to DOACs, developed while on eliquis.  No RV strain on echo -Patient feels uncomfortable with subcu injections.  Pharmacy was able to communicate with hematology, Dr. Myna Hidalgo who recommended Pradaxa -Continue Pradaxa   Active Problems: Candidemia, tricuspid valve endocarditis, MRSA bacteremia, POA -Large vegetation. -CT VS consulted, recommended follow-up after discharge for consideration of surgical valve repair and PFO closure after sustained abstinence of illicit drugs -ID following.  Continue vancomycin, micafungin, will need prolonged antibiotics presumably another 3 weeks  -LUE PICC placed by IR 6/15, replaced 6/24 due to kinking.  Blood cultures cleared.  Shortness of breath, pulmonary edema with multifocal pneumonia Also noted to have a component of  acute anxiety -Placed on strict I's and O's, positive balance of 11.1 L counseled patient on fluid restriction  -Continue IV Lasix 40 mg every 12 hours for diuresis -Continue IV vancomycin, cefepime.  On IV micafungin -Creatinine stable  Hypokalemia, hypomagnesemia -Potassium 2.7, magnesium 1.3, replaced -Placed on daily potassium while on Lasix  Lumbar discitis -Pain currently controlled with current regimen -continue management as above for IV antibiotics   Sepsis, multifactorial, POA with candidemia, endocarditis, discitis -Sepsis physiology improving, Leukocytosis improving  Pneumonia left lower lobe HCAP versus septic emboli on prior admission -Continue IV antibiotics   IVDU history, reports abstinence since March 2023 -Continue abstinence, counseled cessation   Normocytic anemia -H&H stable, 8.1  Thrombocytopenia -Resolved   Chronic pain, pleuritic chest pain -Continue Neurontin  Mild hyponatremia -Resolved, monitor with diuresis   Obesity Estimated body mass index is 31.42 kg/m as calculated from the following:   Height as of this encounter: 5\' 6"  (1.676 m).   Weight as of this encounter: 88.3 kg.  Code Status: Full CODE STATUS DVT Prophylaxis:   dabigatran (PRADAXA) capsule 150 mg   Level of Care: Level of care: Med-Surg Family Communication: Updated patient and her father at the bedside on 6/30 Disposition Plan:      Remains inpatient appropriate: On IV  antibiotics  Procedures:   Consultants:   CT VS ID  Antimicrobials:    Medications  benzonatate  200 mg Oral TID   Chlorhexidine Gluconate Cloth  6 each Topical Daily   dabigatran  150 mg Oral Q12H   diltiazem  180 mg Oral Daily   ferrous gluconate  324 mg Oral Q breakfast   furosemide  40 mg Intravenous BID   gabapentin  400 mg Oral TID   guaiFENesin  1,200 mg Oral BID   melatonin  3 mg Oral QHS   metoprolol tartrate  25 mg Oral BID   oxyCODONE  10 mg Oral Q12H   pantoprazole  40 mg Oral  Daily   pneumococcal 20-valent conjugate vaccine  0.5 mL Intramuscular Tomorrow-1000   [START ON 11/06/2021] potassium chloride  40 mEq Oral Daily   sodium chloride flush  10-40 mL Intracatheter Q12H      Subjective:   Latoya [name] was seen and examined today.  No acute complaints, shortness of breath improving, cough appears to be improving as well.  No chest pain or fevers or chills.     Objective:   Vitals:   11/04/21 1217 11/04/21 2001 11/05/21 0423 11/05/21 1004  BP: 106/83 119/81 103/82 118/84  Pulse:  80 77 79  Resp: (!) 24 19 18    Temp: 97.6 F (36.4 C) 98.2 F (36.8 C) 98.2 F (36.8 C)   TempSrc: Oral Oral Oral   SpO2:      Weight:   88.3 kg   Height:        Intake/Output Summary (Last 24 hours) at 11/05/2021 1325 Last data filed at 11/05/2021 1032 Gross per 24 hour  Intake 899.21 ml  Output 950 ml  Net -50.79 ml     Wt Readings from Last 3 Encounters:  11/05/21 88.3 kg  10/03/21 84 kg  09/26/21 82.6 kg    Physical Exam General: Alert and oriented x 3, NAD Cardiovascular: S1 S2 clear, RRR. No pedal edema b/l Respiratory: Diminished breath sound at the bases Gastrointestinal: Soft, nontender, nondistended, NBS Ext: no pedal edema bilaterally Neuro: no new deficits Psych: Normal affect and demeanor, alert and oriented x3   Data Reviewed:  I have personally reviewed following labs    CBC Lab Results  Component Value Date   WBC 10.0 11/05/2021   RBC 3.31 (L) 11/05/2021   HGB 8.1 (L) 11/05/2021   HCT 28.1 (L) 11/05/2021   MCV 84.9 11/05/2021   MCH 24.5 (L) 11/05/2021   PLT 213 11/05/2021   MCHC 28.8 (L) 11/05/2021   RDW 27.6 (H) 11/05/2021   LYMPHSABS 3.7 10/29/2021   MONOABS 1.3 (H) 10/29/2021   EOSABS 0.9 (H) 10/29/2021   BASOSABS 0.0 10/29/2021     Last metabolic panel Lab Results  Component Value Date   NA 136 11/05/2021   K 2.7 (LL) 11/05/2021   CL 107 11/05/2021   CO2 21 (L) 11/05/2021   BUN 8 11/05/2021   CREATININE 0.60  11/05/2021   GLUCOSE 91 11/05/2021   GFRNONAA >60 11/05/2021   GFRAA >60 08/07/2019   CALCIUM 6.5 (L) 11/05/2021   PHOS 3.6 10/25/2021   PROT 7.5 10/25/2021   ALBUMIN 2.1 (L) 10/25/2021   BILITOT 0.2 (L) 10/25/2021   ALKPHOS 57 10/25/2021   AST 13 (L) 10/25/2021   ALT 7 10/25/2021   ANIONGAP 8 11/05/2021     Latoya [name] M.D. [facility] 11/05/2021, 1:25 PM  Available via Epic secure chat 7am-7pm After 7 pm, please refer  to night coverage provider listed on amion.

## 2021-11-05 NOTE — Progress Notes (Signed)
Date and time results received: 11/05/21 5:49  Test: Hgb  Critical Value: 2.7  Name of Provider Notified: Shauna Hugh, MD paged  Orders received: pending return call

## 2021-11-06 DIAGNOSIS — E871 Hypo-osmolality and hyponatremia: Secondary | ICD-10-CM | POA: Diagnosis not present

## 2021-11-06 DIAGNOSIS — I2692 Saddle embolus of pulmonary artery without acute cor pulmonale: Secondary | ICD-10-CM | POA: Diagnosis not present

## 2021-11-06 DIAGNOSIS — B377 Candidal sepsis: Secondary | ICD-10-CM | POA: Diagnosis not present

## 2021-11-06 DIAGNOSIS — F1910 Other psychoactive substance abuse, uncomplicated: Secondary | ICD-10-CM | POA: Diagnosis not present

## 2021-11-06 DIAGNOSIS — G8921 Chronic pain due to trauma: Secondary | ICD-10-CM | POA: Diagnosis not present

## 2021-11-06 DIAGNOSIS — I079 Rheumatic tricuspid valve disease, unspecified: Secondary | ICD-10-CM | POA: Diagnosis not present

## 2021-11-06 DIAGNOSIS — I2602 Saddle embolus of pulmonary artery with acute cor pulmonale: Secondary | ICD-10-CM | POA: Diagnosis not present

## 2021-11-06 DIAGNOSIS — M4646 Discitis, unspecified, lumbar region: Secondary | ICD-10-CM | POA: Diagnosis not present

## 2021-11-06 DIAGNOSIS — B9562 Methicillin resistant Staphylococcus aureus infection as the cause of diseases classified elsewhere: Secondary | ICD-10-CM | POA: Diagnosis not present

## 2021-11-06 DIAGNOSIS — R7881 Bacteremia: Secondary | ICD-10-CM | POA: Diagnosis not present

## 2021-11-06 DIAGNOSIS — G8929 Other chronic pain: Secondary | ICD-10-CM | POA: Diagnosis not present

## 2021-11-06 LAB — VANCOMYCIN, TROUGH: Vancomycin Tr: 12 ug/mL — ABNORMAL LOW (ref 15–20)

## 2021-11-06 LAB — BASIC METABOLIC PANEL
Anion gap: 8 (ref 5–15)
BUN: 8 mg/dL (ref 6–20)
CO2: 25 mmol/L (ref 22–32)
Calcium: 7.4 mg/dL — ABNORMAL LOW (ref 8.9–10.3)
Chloride: 99 mmol/L (ref 98–111)
Creatinine, Ser: 0.7 mg/dL (ref 0.44–1.00)
GFR, Estimated: >60 mL/min (ref >60)
Glucose, Bld: 130 mg/dL — ABNORMAL HIGH (ref 70–99)
Potassium: 3.3 mmol/L — ABNORMAL LOW (ref 3.5–5.1)
Sodium: 132 mmol/L — ABNORMAL LOW (ref 135–145)

## 2021-11-06 LAB — VANCOMYCIN, PEAK: Vancomycin Pk: 28 ug/mL — ABNORMAL LOW (ref 30–40)

## 2021-11-06 MED ORDER — VANCOMYCIN HCL 1250 MG/250ML IV SOLN
1250.0000 mg | Freq: Two times a day (BID) | INTRAVENOUS | Status: DC
  Administered 2021-11-07 – 2021-11-10 (×8): 1250 mg via INTRAVENOUS
  Filled 2021-11-06 (×9): qty 250

## 2021-11-06 NOTE — Progress Notes (Signed)
[facility]  Date of Admission:  10/11/2021           Reason for visit: Follow up on tricuspid valve endocarditis, lumbar discitis  Current antibiotics: Vancomycin Micafungin Cefepime   ASSESSMENT:    36 y.o. female admitted with:  Tricuspid valve endocarditis: Blood cultures have grown Candida albicans from 10/11/21 this admission with negative repeat cultures on 10/14/21.  Also has a history of MRSA TV endocarditis earlier this year treated with prolonged course of vancomycin and was on linezolid prior to this admission.  Admitted now as above with larger TV vegetation and new septic arthritis L4-5.  Currently on micafungin and vancomycin.  She is not an Angiovac candidate due to PFO and only option in future would be valve repair with PFO closure after abstinence from IVDU shown as an outpatient.  Discitis/OM: She is on antimicrobials as above. Septic pulmonary emboli:  Currently on room air.  Possibility of HCAP raised and she is currently on Day #4 of cefepime.  Saddle pulmonary emboli: Currently on Pradaxa. Hx of IVDU: Discussed with patient and she has been on Methadone and Suboxone in the past.  Notes indicate she has been abstinent from opiates since March.  She expressed an interest today of starting Suboxone again while inpatient.   RECOMMENDATIONS:    Continue vancomycin Continue micafungin Stop cefepime Will discuss further with patient regarding treatment of her OUD.  If deemed appropriate, I would recommend getting her started on suboxone while inpatient and there is a Radio broadcast assistant.  That way she can go home and follow up on a stable dose Will follow   Principal Problem:   Acute saddle pulmonary embolism (HCC) Active Problems:   Endocarditis of tricuspid valve   IV drug abuse (HCC)   Lumbar discitis   Hyponatremia   MRSA bacteremia   Chronic pain   Thrombocytopenia (HCC)   Pneumonia of left lower lobe due to infectious  organism   Candidemia (HCC)   Pleuritic chest pain    MEDICATIONS:    Scheduled Meds:  benzonatate  200 mg Oral TID   Chlorhexidine Gluconate Cloth  6 each Topical Daily   dabigatran  150 mg Oral Q12H   diltiazem  180 mg Oral Daily   ferrous gluconate  324 mg Oral Q breakfast   furosemide  40 mg Intravenous BID   gabapentin  400 mg Oral TID   guaiFENesin  1,200 mg Oral BID   melatonin  3 mg Oral QHS   metoprolol tartrate  25 mg Oral BID   oxyCODONE  10 mg Oral Q12H   pantoprazole  40 mg Oral Daily   pneumococcal 20-valent conjugate vaccine  0.5 mL Intramuscular Tomorrow-1000   potassium chloride  40 mEq Oral Daily   sodium chloride flush  10-40 mL Intracatheter Q12H   Continuous Infusions:  sodium chloride 10 mL/hr at 11/05/21 1500   micafungin (MYCAMINE) 150 mg in sodium chloride 0.9 % 100 mL IVPB 150 mg (11/05/21 1620)   vancomycin 1,000 mg (11/06/21 0710)   PRN Meds:.sodium chloride, acetaminophen **OR** acetaminophen, chlorpheniramine-HYDROcodone, docusate sodium, menthol-cetylpyridinium, ondansetron **OR** ondansetron (ZOFRAN) IV, oxyCODONE-acetaminophen **OR** [DISCONTINUED]  morphine injection, polyethylene glycol, prochlorperazine, sodium chloride flush  SUBJECTIVE:   24 hour events:  No major events  She has no new complaints.   Review of Systems  Constitutional:  Negative for fever.  Respiratory:  Positive for cough.   Cardiovascular:  Positive for chest pain.  Gastrointestinal: Negative.  Genitourinary: Negative.   All other systems reviewed and are negative.     OBJECTIVE:   Blood pressure 110/83, pulse 78, temperature 98.6 F (37 C), temperature source Oral, resp. rate 18, height 5\' 6"  (1.676 m), weight 88.4 kg, SpO2 95 %. Body mass index is 31.46 kg/m.  Physical Exam Constitutional:      General: She is not in acute distress.    Appearance: Normal appearance.  HENT:     Head: Normocephalic and atraumatic.  Eyes:     Extraocular Movements:  Extraocular movements intact.     Conjunctiva/sclera: Conjunctivae normal.  Pulmonary:     Effort: Pulmonary effort is normal. No respiratory distress.  Abdominal:     General: There is no distension.     Palpations: Abdomen is soft.  Musculoskeletal:        General: Normal range of motion.     Cervical back: Normal range of motion and neck supple.  Skin:    General: Skin is warm and dry.  Neurological:     General: No focal deficit present.     Mental Status: She is alert and oriented to person, place, and time.  Psychiatric:        Mood and Affect: Mood normal.        Behavior: Behavior normal.      Lab Results: Lab Results  Component Value Date   WBC 10.0 11/05/2021   HGB 8.1 (L) 11/05/2021   HCT 28.1 (L) 11/05/2021   MCV 84.9 11/05/2021   PLT 213 11/05/2021    Lab Results  Component Value Date   NA 132 (L) 11/06/2021   K 3.3 (L) 11/06/2021   CO2 25 11/06/2021   GLUCOSE 130 (H) 11/06/2021   BUN 8 11/06/2021   CREATININE 0.70 11/06/2021   CALCIUM 7.4 (L) 11/06/2021   GFRNONAA >60 11/06/2021   GFRAA >60 08/07/2019    Lab Results  Component Value Date   ALT 7 10/25/2021   AST 13 (L) 10/25/2021   ALKPHOS 57 10/25/2021   BILITOT 0.2 (L) 10/25/2021       Component Value Date/Time   CRP 13.3 (H) 10/11/2021 0257       Component Value Date/Time   ESRSEDRATE 80 (H) 09/13/2021 0155     I have reviewed the micro and lab results in Epic.  Imaging: No results found.   Imaging independently reviewed in Epic.    11/13/2021 for Infectious Disease [facility] [phone] pager 11/06/2021, 10:00 AM  I have personally spent 50 minutes involved in face-to-face and non-face-to-face activities for this patient on the day of the visit. Professional time spent includes the following activities: Preparing to see the patient (review of tests), Obtaining and/or reviewing separately obtained history (admission/discharge record),  Performing a medically appropriate examination and/or evaluation , Ordering medications/tests/procedures, referring and communicating with other health care professionals, Documenting clinical information in the EMR, Independently interpreting results (not separately reported), Communicating results to the patient/family/caregiver, Counseling and educating the patient/family/caregiver and Care coordination (not separately reported).

## 2021-11-06 NOTE — Progress Notes (Signed)
Pharmacy Antibiotic Note  Latoya [name] is a 36 y.o. female admitted on 10/11/2021 with acute BL PE and TV endocarditis. Patient with history of MSSA and MRSA bacteremia c/b endocarditis and lumbar discitis. Pt recently completed 6 weeks of vancomycin and was started on linezolid after. Pharmacy has been consulted for vancomycin. Micafungin ordered as well with C. Albicans growing in blood. ID following.   Vancomycin peak and trough levels with subtherapeutic AUC of 369.   Plan: Increase vancomycin to 1250mg  Q12hr (EstAUC = 461) Continue Micafungin 150 mg IV every 24 hours per ID  Monitor cultures, clinical status, renal function, vancomycin level Q Mon  Narrow abx as able and f/u duration     Height: 5\' 6"  (167.6 cm) Weight: 88.4 kg (194 lb 14.2 oz) IBW/kg (Calculated) : 59.3  Temp (24hrs), Avg:98.2 F (36.8 C), Min:97.9 F (36.6 C), Max:98.6 F (37 C)  Recent Labs  Lab 11/02/21 0436 11/03/21 1241 11/03/21 2040 11/04/21 0316 11/05/21 0516 11/06/21 0105 11/06/21 1055 11/06/21 1700  WBC 13.5*  --   --   --  10.0  --   --   --   CREATININE 0.57  --   --  0.70 0.60 0.70  --   --   VANCOTROUGH  --   --  18  --   --   --   --  12*  VANCOPEAK  --  >60*  --   --   --   --  28*  --   VANCORANDOM  --   --   --  11  --   --   --   --      Estimated Creatinine Clearance: 110.9 mL/min (by C-G formula based on SCr of 0.7 mg/dL).    Allergies  Allergen Reactions   Naltrexone Anxiety    Severe anxiety Restlessness  Vomiting     Antimicrobials this admission: Vancomycin 6/7 >>  Cefepime 6/7 >> 6/12; 6/30 >> Micafungin 6/8 >>  Vancomycin levels 6/23 Vanc levels- AUC 510  6/30 VP > 60 - possibly contaminated by drug - decr'd to 1g q12 for safety          VR 18/ VT 11- AUC 409  7/3 VP 28/VT12 AUC 369> incr 1250 q12     Microbiology results: 6/7 BCx: C. Albicans 6/7 MRSA PCR: negative 6/9 resp- normal flora 6/10 blood x2- neg  Thank you for allowing pharmacy to be a part  of this patient's care.  8/7, PharmD, BCPS, BCCP Clinical Pharmacist  Please check AMION for all [facility] Pharmacy phone numbers After 10:00 PM, call Main Pharmacy [phone]

## 2021-11-06 NOTE — Progress Notes (Signed)
[facility]                                                                              Latoya [name], is a 36 y.o. female, DOB - Nov 12, 1985, [id number] Admit date - 10/11/2021    Outpatient Primary MD for the patient is Pcp, No  LOS - 26  days       Brief summary   Latoya [name] is a 37 y.o. female with a history of MSSA tricuspid valve endocarditis, MRSA L4-L5 facet join septic discitis in 07/2019 and more recently diagnosis of MRSA bacteremia with infective endocarditis and tricuspid valve vegetation diagnosed 07/2021 who presented from [facility] for saddle pulmonary embolism.  Blood cultures growing candida albicans.  MRI L spine with findings concerning for septic arthritis.    ID is driving antimicrobial management, currently with vancomycin and micafungin.   Cardiothoracic surgery was consulted, noting evidence of PFO at previous Angiovac attempt, stating only option would be for surgical valve repair and PFO closure. They recommended outpatient follow up after sustained abstinence of illicit drugs.    Assessment & Plan    Principal Problem:   Acute saddle pulmonary embolism (HCC) -Failure to DOACs, developed while on eliquis.  No RV strain on echo -Patient feels uncomfortable with subcu injections.  Pharmacy was able to communicate with hematology, Dr. Myna Hidalgo who recommended Pradaxa -Continue Pradaxa   Active Problems: Candidemia, tricuspid valve endocarditis, MRSA bacteremia, POA -Large vegetation. CT VS consulted, recommended follow-up after discharge for consideration of surgical valve repair and PFO closure after sustained abstinence of illicit drugs -Continue IV vancomycin, micafungin, follow ID recommendations.   -LUE PICC placed by IR 6/15, replaced 6/24 due to kinking.  Blood cultures cleared.  Shortness of breath, pulmonary edema with ?multifocal pneumonia Also noted to have a component of acute anxiety -Breathing much improved,  still has 10 L positive balance/error as patient has been flushing in toilet,  -Will continue IV Lasix 40 mg every 12 hours for diuresis, breathing significantly improved.  Will reassess in a.m. if Lasix can be transitioned to oral. -Continue IV vancomycin,  micafungin.  IV cefepime discontinued by ID.  Hypokalemia, hypomagnesemia -Potassium 3.3 replaced  Lumbar discitis -Pain currently controlled with current regimen -continue management as above for IV antibiotics   Sepsis, multifactorial, POA with candidemia, endocarditis, discitis -Sepsis physiology improving, Leukocytosis improving  Pneumonia left lower lobe HCAP versus septic emboli on prior admission -Continue IV antibiotics   IVDU history, reports abstinence since March 2023 -Continue abstinence, counseled cessation   Normocytic anemia -H&H stable, 8.1  Thrombocytopenia -Resolved   Chronic pain, pleuritic chest pain -Continue Neurontin  Mild hyponatremia -Resolved, monitor with diuresis   Obesity Estimated body mass index is 31.46 kg/m as calculated from the following:   Height as of this encounter: 5\' 6"  (1.676 m).   Weight as of this encounter: 88.4 kg.  Code Status: Full CODE STATUS DVT Prophylaxis:   dabigatran (PRADAXA) capsule 150 mg   Level of Care: Level of care: Med-Surg Family Communication: Updated patient and her father at the bedside on 6/30 Disposition Plan:      Remains inpatient appropriate:  On IV antibiotics  Procedures:   Consultants:   CT VS ID  Antimicrobials:    Medications  benzonatate  200 mg Oral TID   Chlorhexidine Gluconate Cloth  6 each Topical Daily   dabigatran  150 mg Oral Q12H   diltiazem  180 mg Oral Daily   ferrous gluconate  324 mg Oral Q breakfast   furosemide  40 mg Intravenous BID   gabapentin  400 mg Oral TID   guaiFENesin  1,200 mg Oral BID   melatonin  3 mg Oral QHS   metoprolol tartrate  25 mg Oral BID   oxyCODONE  10 mg Oral Q12H   pantoprazole  40 mg  Oral Daily   pneumococcal 20-valent conjugate vaccine  0.5 mL Intramuscular Tomorrow-1000   potassium chloride  40 mEq Oral Daily   sodium chloride flush  10-40 mL Intracatheter Q12H      Subjective:   Latoya [name] was seen and examined today.  Breathing much improved, coughing is better today.  No fevers or chills.    Objective:   Vitals:   11/05/21 2136 11/06/21 0437 11/06/21 0713 11/06/21 0806  BP: 94/70 114/76  110/83  Pulse:  78  78  Resp:  18    Temp:  98.6 F (37 C)    TempSrc:  Oral    SpO2:  95%    Weight:   88.4 kg   Height:        Intake/Output Summary (Last 24 hours) at 11/06/2021 1417 Last data filed at 11/06/2021 1218 Gross per 24 hour  Intake 3274.43 ml  Output 1750 ml  Net 1524.43 ml     Wt Readings from Last 3 Encounters:  11/06/21 88.4 kg  10/03/21 84 kg  09/26/21 82.6 kg    Physical Exam General: Alert and oriented x 3, NAD Cardiovascular: S1 S2 clear, RRR. No pedal edema b/l Respiratory: Diminished breath sounds at the bases but fairly clear, no wheezing Gastrointestinal: Soft, nontender, nondistended, NBS Ext: no pedal edema bilaterally Neuro: no new deficits Psych: Normal affect and demeanor, alert and oriented x3    Data Reviewed:  I have personally reviewed following labs    CBC Lab Results  Component Value Date   WBC 10.0 11/05/2021   RBC 3.31 (L) 11/05/2021   HGB 8.1 (L) 11/05/2021   HCT 28.1 (L) 11/05/2021   MCV 84.9 11/05/2021   MCH 24.5 (L) 11/05/2021   PLT 213 11/05/2021   MCHC 28.8 (L) 11/05/2021   RDW 27.6 (H) 11/05/2021   LYMPHSABS 3.7 10/29/2021   MONOABS 1.3 (H) 10/29/2021   EOSABS 0.9 (H) 10/29/2021   BASOSABS 0.0 10/29/2021     Last metabolic panel Lab Results  Component Value Date   NA 132 (L) 11/06/2021   K 3.3 (L) 11/06/2021   CL 99 11/06/2021   CO2 25 11/06/2021   BUN 8 11/06/2021   CREATININE 0.70 11/06/2021   GLUCOSE 130 (H) 11/06/2021   GFRNONAA >60 11/06/2021   GFRAA >60 08/07/2019    CALCIUM 7.4 (L) 11/06/2021   PHOS 3.6 10/25/2021   PROT 7.5 10/25/2021   ALBUMIN 2.1 (L) 10/25/2021   BILITOT 0.2 (L) 10/25/2021   ALKPHOS 57 10/25/2021   AST 13 (L) 10/25/2021   ALT 7 10/25/2021   ANIONGAP 8 11/06/2021     Trenell Moxey M.D. [facility] 11/06/2021, 2:17 PM  Available via Epic secure chat 7am-7pm After 7 pm, please refer to night coverage provider listed on amion.

## 2021-11-07 DIAGNOSIS — E871 Hypo-osmolality and hyponatremia: Secondary | ICD-10-CM | POA: Diagnosis not present

## 2021-11-07 DIAGNOSIS — I2692 Saddle embolus of pulmonary artery without acute cor pulmonale: Secondary | ICD-10-CM | POA: Diagnosis not present

## 2021-11-07 DIAGNOSIS — G8921 Chronic pain due to trauma: Secondary | ICD-10-CM | POA: Diagnosis not present

## 2021-11-07 DIAGNOSIS — I079 Rheumatic tricuspid valve disease, unspecified: Secondary | ICD-10-CM | POA: Diagnosis not present

## 2021-11-07 DIAGNOSIS — I2602 Saddle embolus of pulmonary artery with acute cor pulmonale: Secondary | ICD-10-CM | POA: Diagnosis not present

## 2021-11-07 DIAGNOSIS — G8929 Other chronic pain: Secondary | ICD-10-CM | POA: Diagnosis not present

## 2021-11-07 DIAGNOSIS — M4646 Discitis, unspecified, lumbar region: Secondary | ICD-10-CM | POA: Diagnosis not present

## 2021-11-07 DIAGNOSIS — B377 Candidal sepsis: Secondary | ICD-10-CM | POA: Diagnosis not present

## 2021-11-07 LAB — BASIC METABOLIC PANEL
Anion gap: 10 (ref 5–15)
BUN: 9 mg/dL (ref 6–20)
CO2: 28 mmol/L (ref 22–32)
Calcium: 8 mg/dL — ABNORMAL LOW (ref 8.9–10.3)
Chloride: 98 mmol/L (ref 98–111)
Creatinine, Ser: 0.81 mg/dL (ref 0.44–1.00)
GFR, Estimated: >60 mL/min (ref >60)
Glucose, Bld: 109 mg/dL — ABNORMAL HIGH (ref 70–99)
Potassium: 3.9 mmol/L (ref 3.5–5.1)
Sodium: 136 mmol/L (ref 135–145)

## 2021-11-07 LAB — MAGNESIUM: Magnesium: 2 mg/dL (ref 1.7–2.4)

## 2021-11-07 MED ORDER — FUROSEMIDE 40 MG PO TABS
40.0000 mg | ORAL_TABLET | Freq: Every day | ORAL | Status: AC
Start: 2021-11-08 — End: 2021-11-10
  Filled 2021-11-07 (×4): qty 1

## 2021-11-07 MED ORDER — FUROSEMIDE 40 MG PO TABS: 40.0000 mg | ORAL_TABLET | Freq: Every day | ORAL | Status: DC

## 2021-11-07 MED ORDER — FUROSEMIDE 10 MG/ML IJ SOLN
40.0000 mg | Freq: Two times a day (BID) | INTRAMUSCULAR | Status: AC
  Administered 2021-11-07: 40 mg via INTRAVENOUS
  Filled 2021-11-07: qty 4

## 2021-11-07 NOTE — Progress Notes (Signed)
[facility]                                                                              Latoya [name], is a 36 y.o. female, DOB - [birth year], [id number] Admit date - 10/11/2021    Outpatient Primary MD for the patient is Pcp, No  LOS - 27  days       Brief summary   Latoya [name] is a 36 y.o. female with a history of MSSA tricuspid valve endocarditis, MRSA L4-L5 facet join septic discitis in 07/2019 and more recently diagnosis of MRSA bacteremia with infective endocarditis and tricuspid valve vegetation diagnosed 07/2021 who presented from [facility] for saddle pulmonary embolism.  Blood cultures growing candida albicans.  MRI L spine with findings concerning for septic arthritis.    ID is driving antimicrobial management, currently with vancomycin and micafungin.   Cardiothoracic surgery was consulted, noting evidence of PFO at previous Angiovac attempt, stating only option would be for surgical valve repair and PFO closure. They recommended outpatient follow up after sustained abstinence of illicit drugs.    Assessment & Plan    Principal Problem:   Acute saddle pulmonary embolism (HCC) -Failure to DOACs, developed while on eliquis.  No RV strain on echo -Patient feels uncomfortable with subcu injections.  Pharmacy was able to communicate with hematology, Dr. Myna Hidalgo who recommended Pradaxa Continue Pradaxa   Active Problems: Candidemia, tricuspid valve endocarditis, MRSA bacteremia, POA -Large vegetation. CT VS consulted, recommended follow-up after discharge for consideration of surgical valve repair and PFO closure after sustained abstinence of illicit drugs  -LUE PICC placed by IR 6/15, replaced 6/24 due to kinking.  Blood cultures cleared. -Continue IV vancomycin, micafungin, ID following  Shortness of breath, pulmonary edema with ?multifocal pneumonia Also noted to have a component of acute anxiety -Breathing much improved, will finish IV  Lasix today, transition to oral Lasix for 3 days in a.m.   Hypokalemia, hypomagnesemia -Replaced  Lumbar discitis -Pain currently controlled with current regimen -continue management as above for IV antibiotics   Sepsis, multifactorial, POA with candidemia, endocarditis, discitis -Sepsis physiology improving, Leukocytosis improving  Pneumonia left lower lobe HCAP versus septic emboli on prior admission -Continue IV antibiotics   IVDU history, reports abstinence since March 2023 -Continue abstinence, counseled cessation   Normocytic anemia -H&H stable 8.1  Thrombocytopenia -Resolved   Chronic pain, pleuritic chest pain -Continue Neurontin  Mild hyponatremia -Resolved, monitor with diuresis   Obesity Estimated body mass index is 31.21 kg/m as calculated from the following:   Height as of this encounter: 5\' 6"  (1.676 m).   Weight as of this encounter: 87.7 kg.  Code Status: Full CODE STATUS DVT Prophylaxis:   dabigatran (PRADAXA) capsule 150 mg   Level of Care: Level of care: Med-Surg Family Communication: Updated patient and her father at the bedside on 6/30 Disposition Plan:      Remains inpatient appropriate: On IV antibiotics  Procedures:   Consultants:   CT VS ID  Antimicrobials:    Medications  benzonatate  200 mg Oral TID   Chlorhexidine Gluconate Cloth  6 each Topical Daily   dabigatran  150 mg Oral Q12H   diltiazem  180 mg Oral Daily   ferrous gluconate  324 mg Oral Q breakfast   furosemide  40 mg Intravenous BID   [START ON 11/08/2021] furosemide  40 mg Oral Daily   gabapentin  400 mg Oral TID   guaiFENesin  1,200 mg Oral BID   melatonin  3 mg Oral QHS   metoprolol tartrate  25 mg Oral BID   oxyCODONE  10 mg Oral Q12H   pantoprazole  40 mg Oral Daily   pneumococcal 20-valent conjugate vaccine  0.5 mL Intramuscular Tomorrow-1000   potassium chloride  40 mEq Oral Daily   sodium chloride flush  10-40 mL Intracatheter Q12H      Subjective:    Latoya [name] was seen and examined today.  Feels a lot better, shortness of breath is improving, cough has improved.  No fevers or chills  Objective:   Vitals:   11/06/21 2346 11/07/21 0500 11/07/21 0529 11/07/21 0824  BP: 107/84  (!) 117/93 113/88  Pulse: 86  82 80  Resp: 19  19 18   Temp: 98.2 F (36.8 C)  97.8 F (36.6 C) 98.3 F (36.8 C)  TempSrc: Oral  Oral Oral  SpO2: 99%  96% 97%  Weight:  87.7 kg    Height:        Intake/Output Summary (Last 24 hours) at 11/07/2021 1440 Last data filed at 11/07/2021 0800 Gross per 24 hour  Intake 420 ml  Output --  Net 420 ml     Wt Readings from Last 3 Encounters:  11/07/21 87.7 kg  10/03/21 84 kg  09/26/21 82.6 kg    Physical Exam General: Alert and oriented x 3, NAD Cardiovascular: S1 S2 clear, RRR. No pedal edema b/l Respiratory: Fairly CTA B Gastrointestinal: Soft, nontender, nondistended, NBS Ext: no pedal edema bilaterally Neuro: no new deficits Psych: Normal affect and demeanor, alert and oriented x3    Data Reviewed:  I have personally reviewed following labs    CBC Lab Results  Component Value Date   WBC 10.0 11/05/2021   RBC 3.31 (L) 11/05/2021   HGB 8.1 (L) 11/05/2021   HCT 28.1 (L) 11/05/2021   MCV 84.9 11/05/2021   MCH 24.5 (L) 11/05/2021   PLT 213 11/05/2021   MCHC 28.8 (L) 11/05/2021   RDW 27.6 (H) 11/05/2021   LYMPHSABS 3.7 10/29/2021   MONOABS 1.3 (H) 10/29/2021   EOSABS 0.9 (H) 10/29/2021   BASOSABS 0.0 10/29/2021     Last metabolic panel Lab Results  Component Value Date   NA 136 11/07/2021   K 3.9 11/07/2021   CL 98 11/07/2021   CO2 28 11/07/2021   BUN 9 11/07/2021   CREATININE 0.81 11/07/2021   GLUCOSE 109 (H) 11/07/2021   GFRNONAA >60 11/07/2021   GFRAA >60 08/07/2019   CALCIUM 8.0 (L) 11/07/2021   PHOS 3.6 10/25/2021   PROT 7.5 10/25/2021   ALBUMIN 2.1 (L) 10/25/2021   BILITOT 0.2 (L) 10/25/2021   ALKPHOS 57 10/25/2021   AST 13 (L) 10/25/2021   ALT 7 10/25/2021    ANIONGAP 10 11/07/2021     Latoya [name] M.D. [facility] 11/07/2021, 2:40 PM  Available via Epic secure chat 7am-7pm After 7 pm, please refer to night coverage provider listed on amion.

## 2021-11-08 DIAGNOSIS — B377 Candidal sepsis: Secondary | ICD-10-CM | POA: Diagnosis not present

## 2021-11-08 DIAGNOSIS — I2692 Saddle embolus of pulmonary artery without acute cor pulmonale: Secondary | ICD-10-CM | POA: Diagnosis not present

## 2021-11-08 DIAGNOSIS — B9562 Methicillin resistant Staphylococcus aureus infection as the cause of diseases classified elsewhere: Secondary | ICD-10-CM | POA: Diagnosis not present

## 2021-11-08 DIAGNOSIS — I079 Rheumatic tricuspid valve disease, unspecified: Secondary | ICD-10-CM | POA: Diagnosis not present

## 2021-11-08 DIAGNOSIS — R7881 Bacteremia: Secondary | ICD-10-CM | POA: Diagnosis not present

## 2021-11-08 DIAGNOSIS — M4646 Discitis, unspecified, lumbar region: Secondary | ICD-10-CM | POA: Diagnosis not present

## 2021-11-08 LAB — CBC
HCT: 24.5 % — ABNORMAL LOW (ref 36.0–46.0)
HCT: 31.4 % — ABNORMAL LOW (ref 36.0–46.0)
Hemoglobin: 6.6 g/dL — CL (ref 12.0–15.0)
Hemoglobin: 8.9 g/dL — ABNORMAL LOW (ref 12.0–15.0)
MCH: 24.4 pg — ABNORMAL LOW (ref 26.0–34.0)
MCH: 25.1 pg — ABNORMAL LOW (ref 26.0–34.0)
MCHC: 26.9 g/dL — ABNORMAL LOW (ref 30.0–36.0)
MCHC: 28.3 g/dL — ABNORMAL LOW (ref 30.0–36.0)
MCV: 90.4 fL (ref 80.0–100.0)
MCV: 88.5 fL (ref 80.0–100.0)
Platelets: 126 10*3/uL — ABNORMAL LOW (ref 150–400)
Platelets: 155 10*3/uL (ref 150–400)
RBC: 2.71 MIL/uL — ABNORMAL LOW (ref 3.87–5.11)
RBC: 3.55 MIL/uL — ABNORMAL LOW (ref 3.87–5.11)
RDW: 28.8 % — ABNORMAL HIGH (ref 11.5–15.5)
RDW: 28.5 % — ABNORMAL HIGH (ref 11.5–15.5)
WBC: 8.6 10*3/uL (ref 4.0–10.5)
WBC: 9.7 10*3/uL (ref 4.0–10.5)
nRBC: 0.3 % — ABNORMAL HIGH (ref 0.0–0.2)
nRBC: 0.5 % — ABNORMAL HIGH (ref 0.0–0.2)

## 2021-11-08 LAB — BASIC METABOLIC PANEL
Anion gap: 12 (ref 5–15)
BUN: 9 mg/dL (ref 6–20)
CO2: 25 mmol/L (ref 22–32)
Calcium: 7.9 mg/dL — ABNORMAL LOW (ref 8.9–10.3)
Chloride: 97 mmol/L — ABNORMAL LOW (ref 98–111)
Creatinine, Ser: 0.57 mg/dL (ref 0.44–1.00)
GFR, Estimated: >60 mL/min (ref >60)
Glucose, Bld: 88 mg/dL (ref 70–99)
Potassium: 3.8 mmol/L (ref 3.5–5.1)
Sodium: 134 mmol/L — ABNORMAL LOW (ref 135–145)

## 2021-11-08 NOTE — Progress Notes (Signed)
[facility]                                                                              Rital Cavey, is a 36 y.o. female, DOB - May 01, 1986, HQR:[id number] Admit date - 10/11/2021    Outpatient Primary MD for the patient is Pcp, No  LOS - 28  days       Brief summary   Latoya [name] is a 36 y.o. female with a history of MSSA tricuspid valve endocarditis, MRSA L4-L5 facet join septic discitis in 07/2019 and more recently diagnosis of MRSA bacteremia with infective endocarditis and tricuspid valve vegetation diagnosed 07/2021 who presented from [facility] for saddle pulmonary embolism.  Blood cultures growing candida albicans.  MRI L spine with findings concerning for septic arthritis.    ID is driving antimicrobial management, currently with vancomycin and micafungin.   Cardiothoracic surgery was consulted, noting evidence of PFO at previous Angiovac attempt, stating only option would be for surgical valve repair and PFO closure. They recommended outpatient follow up after sustained abstinence of illicit drugs.    Assessment & Plan    Principal Problem:   Acute saddle pulmonary embolism (HCC) -Failure to DOACs, developed while on eliquis.  No RV strain on echo -Patient feels uncomfortable with subcu injections.  Pharmacy was able to communicate with hematology, Dr. Myna Hidalgo who recommended Pradaxa Continue Pradaxa   Active Problems: Candidemia, tricuspid valve endocarditis, MRSA bacteremia, POA -Large vegetation. CT VS consulted, recommended follow-up after discharge for consideration of surgical valve repair and PFO closure after sustained abstinence of illicit drugs  -LUE PICC placed by IR 6/15, replaced 6/24 due to kinking.  Blood cultures cleared. -Continue IV vancomycin, micafungin, ID following  Shortness of breath, pulmonary edema with ?multifocal pneumonia Also noted to have a component of acute anxiety -Breathing much improved, will finish IV  Lasix today, transition to oral Lasix for 3 days in a.m.   Hypokalemia, hypomagnesemia -Replaced  Lumbar discitis -Pain currently controlled with current regimen -continue management as above for IV antibiotics   Sepsis, multifactorial, POA with candidemia, endocarditis, discitis -Sepsis physiology improving, Leukocytosis improving  Pneumonia left lower lobe HCAP versus septic emboli on prior admission -Continue IV antibiotics   IVDU history, reports abstinence since March 2023 -Continue abstinence, counseled cessation   Normocytic anemia -H&H stable 8.1  Thrombocytopenia -Resolved   Chronic pain, pleuritic chest pain -Continue Neurontin  Mild hyponatremia -Resolved, monitor with diuresis   Obesity Estimated body mass index is 30.04 kg/m as calculated from the following:   Height as of this encounter: 5\' 6"  (1.676 m).   Weight as of this encounter: 84.4 kg.  Code Status: Full CODE STATUS DVT Prophylaxis:   dabigatran (PRADAXA) capsule 150 mg   Level of Care: Level of care: Med-Surg Family Communication: Updated patient and her father at the bedside on 6/30 Disposition Plan:      Remains inpatient appropriate: On IV antibiotics    Consultants:   CT VS ID     Medications  benzonatate  200 mg Oral TID   Chlorhexidine Gluconate Cloth  6 each Topical Daily   dabigatran  150 mg  Oral Q12H   diltiazem  180 mg Oral Daily   ferrous gluconate  324 mg Oral Q breakfast   furosemide  40 mg Oral Daily   gabapentin  400 mg Oral TID   guaiFENesin  1,200 mg Oral BID   melatonin  3 mg Oral QHS   metoprolol tartrate  25 mg Oral BID   oxyCODONE  10 mg Oral Q12H   pantoprazole  40 mg Oral Daily   potassium chloride  40 mEq Oral Daily   sodium chloride flush  10-40 mL Intracatheter Q12H      Subjective:   Latoya [name] was seen and examined today.  She is on room air, reports chest pain,  Vomited x1 this am, no ab pain, reports + flatus .  No fevers or  chills Bp low normal, d/c cardizem Sweating   Objective:   Vitals:   11/07/21 0824 11/07/21 1941 11/08/21 0503 11/08/21 0817  BP: 113/88 (!) 121/92 120/87 120/83  Pulse: 80 79 80 100  Resp: 18 18 18 18   Temp: 98.3 F (36.8 C) 97.6 F (36.4 C) (!) 96.1 F (35.6 C) 99.2 F (37.3 C)  TempSrc: Oral Oral Axillary Axillary  SpO2: 97% 97%  98%  Weight:   84.4 kg   Height:        Intake/Output Summary (Last 24 hours) at 11/08/2021 0853 Last data filed at 11/07/2021 1300 Gross per 24 hour  Intake 237 ml  Output --  Net 237 ml     Wt Readings from Last 3 Encounters:  11/08/21 84.4 kg  10/03/21 84 kg  09/26/21 82.6 kg    Physical Exam General: Alert and oriented x 3, NAD Cardiovascular: S1 S2 clear, RRR. No pedal edema b/l Respiratory: Fairly CTA B Gastrointestinal: Soft, nontender, nondistended, NBS Ext: no pedal edema bilaterally Neuro: no new deficits Psych: Normal affect and demeanor, alert and oriented x3    Data Reviewed:  I have personally reviewed following labs    CBC Lab Results  Component Value Date   WBC 9.7 11/08/2021   RBC 3.55 (L) 11/08/2021   HGB 8.9 (L) 11/08/2021   HCT 31.4 (L) 11/08/2021   MCV 88.5 11/08/2021   MCH 25.1 (L) 11/08/2021   PLT 155 11/08/2021   MCHC 28.3 (L) 11/08/2021   RDW 28.5 (H) 11/08/2021   LYMPHSABS 3.7 10/29/2021   MONOABS 1.3 (H) 10/29/2021   EOSABS 0.9 (H) 10/29/2021   BASOSABS 0.0 10/29/2021     Last metabolic panel Lab Results  Component Value Date   NA 134 (L) 11/08/2021   K 3.8 11/08/2021   CL 97 (L) 11/08/2021   CO2 25 11/08/2021   BUN 9 11/08/2021   CREATININE 0.57 11/08/2021   GLUCOSE 88 11/08/2021   GFRNONAA >60 11/08/2021   GFRAA >60 08/07/2019   CALCIUM 7.9 (L) 11/08/2021   PHOS 3.6 10/25/2021   PROT 7.5 10/25/2021   ALBUMIN 2.1 (L) 10/25/2021   BILITOT 0.2 (L) 10/25/2021   ALKPHOS 57 10/25/2021   AST 13 (L) 10/25/2021   ALT 7 10/25/2021   ANIONGAP 12 11/08/2021     01/09/2022 M.D. PhD  FACP [facility] 11/08/2021, 8:53 AM  Available via Epic secure chat 7am-7pm After 7 pm, please refer to night coverage provider listed on amion.

## 2021-11-08 NOTE — TOC Progression Note (Signed)
Transition of Care [facility]) - Progression Note    Patient Details  Name: Latoya [name] MRN: [id number] Date of Birth: 1986/03/30  Transition of Care [facility]) CM/SW Contact  Graves-Bigelow, Lamar Laundry, RN Phone Number: 11/08/2021, 12:18 PM  Clinical Narrative:  Patient continues on IV antibiotics in house. Case Manager continuing to follow for disposition needs.   Expected Discharge Plan: Home/Self Care Barriers to Discharge: Continued Medical Work up  Expected Discharge Plan and Services Expected Discharge Plan: Home/Self Care In-house Referral: Clinical Social Work Discharge Planning Services: CM Consult Post Acute Care Choice: NA Living arrangements for the past 2 months: Single Family Home                  Readmission Risk Interventions    10/12/2021    3:43 PM 08/17/2021   11:31 AM  Readmission Risk Prevention Plan  Transportation Screening Complete Complete  PCP or Specialist Appt within 5-7 Days  Complete  Home Care Screening  Complete  Medication Review (RN CM)  Complete  HRI or Home Care Consult Complete   Social Work Consult for Recovery Care Planning/Counseling Complete   Palliative Care Screening Not Applicable   Medication Review Oceanographer) Referral to Pharmacy

## 2021-11-09 DIAGNOSIS — I2692 Saddle embolus of pulmonary artery without acute cor pulmonale: Secondary | ICD-10-CM | POA: Diagnosis not present

## 2021-11-09 DIAGNOSIS — R7881 Bacteremia: Secondary | ICD-10-CM | POA: Diagnosis not present

## 2021-11-09 DIAGNOSIS — M4646 Discitis, unspecified, lumbar region: Secondary | ICD-10-CM | POA: Diagnosis not present

## 2021-11-09 DIAGNOSIS — B377 Candidal sepsis: Secondary | ICD-10-CM | POA: Diagnosis not present

## 2021-11-09 DIAGNOSIS — F1910 Other psychoactive substance abuse, uncomplicated: Secondary | ICD-10-CM | POA: Diagnosis not present

## 2021-11-09 DIAGNOSIS — B9562 Methicillin resistant Staphylococcus aureus infection as the cause of diseases classified elsewhere: Secondary | ICD-10-CM | POA: Diagnosis not present

## 2021-11-09 DIAGNOSIS — I079 Rheumatic tricuspid valve disease, unspecified: Secondary | ICD-10-CM | POA: Diagnosis not present

## 2021-11-09 MED ORDER — ENSURE ENLIVE PO LIQD
237.0000 mL | Freq: Two times a day (BID) | ORAL | Status: DC
  Administered 2021-11-09 – 2021-11-12 (×5): 237 mL via ORAL

## 2021-11-09 MED ORDER — METOPROLOL TARTRATE 12.5 MG HALF TABLET
12.5000 mg | ORAL_TABLET | Freq: Two times a day (BID) | ORAL | Status: DC
  Administered 2021-11-09 – 2021-11-11 (×4): 12.5 mg via ORAL
  Filled 2021-11-09 (×4): qty 1

## 2021-11-09 NOTE — Progress Notes (Signed)
[facility]                                                                              [name], is a 36 y.o. female, DOB - 02/03/1986, EXN:[id number] Admit date - 10/11/2021    Outpatient Primary MD for the patient is Pcp, No  LOS - 29  days       Brief summary   Latoya [name] is a 36 y.o. female with a history of MSSA tricuspid valve endocarditis, MRSA L4-L5 facet join septic discitis in 07/2019 and more recently diagnosis of MRSA bacteremia with infective endocarditis and tricuspid valve vegetation diagnosed 07/2021 who presented from [facility] for saddle pulmonary embolism.  Blood cultures growing candida albicans.  MRI L spine with findings concerning for septic arthritis.    ID is driving antimicrobial management, currently with vancomycin and micafungin.   Cardiothoracic surgery was consulted, noting evidence of PFO at previous Angiovac attempt, stating only option would be for surgical valve repair and PFO closure. They recommended outpatient follow up after sustained abstinence of illicit drugs.    Assessment & Plan    Principal Problem:   Acute saddle pulmonary embolism (HCC) -Failure to DOACs, developed while on eliquis.  No RV strain on echo -Patient feels uncomfortable with subcu injections.  Pharmacy was able to communicate with hematology, Dr. Myna Hidalgo who recommended Pradaxa Continue Pradaxa   Active Problems: Candidemia, tricuspid valve endocarditis, MRSA bacteremia, POA -Large vegetation. CT VS consulted, recommended follow-up after discharge for consideration of surgical valve repair and PFO closure after sustained abstinence of illicit drugs  -LUE PICC placed by IR 6/15, replaced 6/24 due to kinking.  Blood cultures cleared. -Continue IV vancomycin, micafungin, ID following  Shortness of breath, pulmonary edema with ?multifocal pneumonia Also noted to have a component of acute anxiety -Breathing much improved, will finish IV  Lasix today, transition to oral Lasix for 3 days in a.m.   Hypokalemia, hypomagnesemia -Replaced  Lumbar discitis -Pain currently controlled with current regimen -continue management as above for IV antibiotics   Sepsis, multifactorial, POA with candidemia, endocarditis, discitis -Sepsis physiology improving, Leukocytosis improving  Pneumonia left lower lobe HCAP versus septic emboli on prior admission -Continue IV antibiotics   IVDU history, reports abstinence since March 2023 -Continue abstinence, counseled cessation   Normocytic anemia -H&H stable 8.1  Thrombocytopenia -Resolved   Chronic pain, pleuritic chest pain -Continue Neurontin  Mild hyponatremia -Resolved, monitor with diuresis   Obesity Estimated body mass index is 31.33 kg/m as calculated from the following:   Height as of this encounter: 5\' 6"  (1.676 m).   Weight as of this encounter: 88 kg.  Code Status: Full CODE STATUS DVT Prophylaxis:   dabigatran (PRADAXA) capsule 150 mg   Level of Care: Level of care: Med-Surg Family Communication: Updated patient and her father at the bedside on 6/30 Disposition Plan:      Remains inpatient appropriate: On IV antibiotics    Consultants:   CT VS ID     Medications  benzonatate  200 mg Oral TID   Chlorhexidine Gluconate Cloth  6 each Topical Daily   dabigatran  150 mg  Oral Q12H   feeding supplement  237 mL Oral BID BM   ferrous gluconate  324 mg Oral Q breakfast   furosemide  40 mg Oral Daily   gabapentin  400 mg Oral TID   guaiFENesin  1,200 mg Oral BID   melatonin  3 mg Oral QHS   metoprolol tartrate  12.5 mg Oral BID   oxyCODONE  10 mg Oral Q12H   pantoprazole  40 mg Oral Daily   potassium chloride  40 mEq Oral Daily   sodium chloride flush  10-40 mL Intracatheter Q12H      Subjective:   Latoya [name] was seen and examined today.  She is on room air, no vomiting , reports poor appetite  No significant changes,  Bp low normal, will  decrease lopressor .  No fevers or chills  Objective:   Vitals:   11/08/21 0817 11/08/21 1257 11/08/21 2136 11/09/21 0610  BP: 120/83 102/90 (!) 107/92 109/82  Pulse: 100 86 80 88  Resp: 18 20 18 20   Temp: 99.2 F (37.3 C) 97.7 F (36.5 C) 97.9 F (36.6 C) 98 F (36.7 C)  TempSrc: Axillary Oral Oral Oral  SpO2: 98%  94% 94%  Weight:    88 kg  Height:        Intake/Output Summary (Last 24 hours) at 11/09/2021 1329 Last data filed at 11/09/2021 0900 Gross per 24 hour  Intake 2617.42 ml  Output --  Net 2617.42 ml     Wt Readings from Last 3 Encounters:  11/09/21 88 kg  10/03/21 84 kg  09/26/21 82.6 kg    Physical Exam General: Alert and oriented x 3, NAD Cardiovascular: S1 S2 clear, RRR. No pedal edema b/l Respiratory: Fairly CTA B Gastrointestinal: Soft, nontender, nondistended, NBS Ext: no pedal edema bilaterally Neuro: no new deficits Psych: Normal affect and demeanor, alert and oriented x3    Data Reviewed:  I have personally reviewed following labs    CBC Lab Results  Component Value Date   WBC 9.7 11/08/2021   RBC 3.55 (L) 11/08/2021   HGB 8.9 (L) 11/08/2021   HCT 31.4 (L) 11/08/2021   MCV 88.5 11/08/2021   MCH 25.1 (L) 11/08/2021   PLT 155 11/08/2021   MCHC 28.3 (L) 11/08/2021   RDW 28.5 (H) 11/08/2021   LYMPHSABS 3.7 10/29/2021   MONOABS 1.3 (H) 10/29/2021   EOSABS 0.9 (H) 10/29/2021   BASOSABS 0.0 10/29/2021     Last metabolic panel Lab Results  Component Value Date   NA 134 (L) 11/08/2021   K 3.8 11/08/2021   CL 97 (L) 11/08/2021   CO2 25 11/08/2021   BUN 9 11/08/2021   CREATININE 0.57 11/08/2021   GLUCOSE 88 11/08/2021   GFRNONAA >60 11/08/2021   GFRAA >60 08/07/2019   CALCIUM 7.9 (L) 11/08/2021   PHOS 3.6 10/25/2021   PROT 7.5 10/25/2021   ALBUMIN 2.1 (L) 10/25/2021   BILITOT 0.2 (L) 10/25/2021   ALKPHOS 57 10/25/2021   AST 13 (L) 10/25/2021   ALT 7 10/25/2021   ANIONGAP 12 11/08/2021     01/09/2022 M.D. PhD FACP [facility] 11/09/2021, 1:29 PM  Available via Epic secure chat 7am-7pm After 7 pm, please refer to night coverage provider listed on amion.

## 2021-11-09 NOTE — Progress Notes (Addendum)
[facility], pt's BP is on the lower end with SBP in 90's and last bp was 100/81. Questioning if physician wants Lasix given. Attending adjusting meds.   1145: Pt refused Lasix.

## 2021-11-09 NOTE — Progress Notes (Signed)
[facility]  Date of Admission:  10/11/2021           Reason for visit: Follow up on tricuspid valve endocarditis, lumbar discitis   Current antibiotics: Vancomycin Micafungin  ASSESSMENT:    36 y.o. female admitted with:  Tricuspid valve endocarditis: Blood cultures have grown Candida albicans from 10/11/21 this admission with negative repeat cultures on 10/14/21.  Also has a history of MRSA TV endocarditis earlier this year treated with prolonged course of vancomycin and was on linezolid prior to this admission.  Admitted now as above with larger TV vegetation and new septic arthritis L4-5.  Currently on micafungin and vancomycin.  She is not an Angiovac candidate due to PFO and only option in future would be valve repair with PFO closure after abstinence from IVDU shown as an outpatient.  Discitis/OM: She is on antimicrobials as above. Septic pulmonary emboli:  Currently on room air.  Saddle pulmonary emboli: Currently on Pradaxa. Hx of IVDU: Reports she has been abstinent from opiates since March.    RECOMMENDATIONS:    Continue vancomycin Continue micafungin Tentative end date of 11/25/21 Will follow up again early next week.  Dr Renold Don here this weekend if needed.   Principal Problem:   Acute saddle pulmonary embolism (HCC) Active Problems:   Endocarditis of tricuspid valve   IV drug abuse (HCC)   Lumbar discitis   Hyponatremia   MRSA bacteremia   Chronic pain   Thrombocytopenia (HCC)   Pneumonia of left lower lobe due to infectious organism   Candidemia (HCC)   Pleuritic chest pain    MEDICATIONS:    Scheduled Meds:  benzonatate  200 mg Oral TID   Chlorhexidine Gluconate Cloth  6 each Topical Daily   dabigatran  150 mg Oral Q12H   ferrous gluconate  324 mg Oral Q breakfast   furosemide  40 mg Oral Daily   gabapentin  400 mg Oral TID   guaiFENesin  1,200 mg Oral BID   melatonin  3 mg Oral QHS   metoprolol tartrate  25 mg Oral BID    oxyCODONE  10 mg Oral Q12H   pantoprazole  40 mg Oral Daily   potassium chloride  40 mEq Oral Daily   sodium chloride flush  10-40 mL Intracatheter Q12H   Continuous Infusions:  sodium chloride 10 mL/hr at 11/09/21 0300   micafungin (MYCAMINE) 150 mg in sodium chloride 0.9 % 100 mL IVPB 107.5 mL/hr at 11/09/21 0300   vancomycin 1,250 mg (11/09/21 0557)   PRN Meds:.sodium chloride, acetaminophen **OR** acetaminophen, chlorpheniramine-HYDROcodone, docusate sodium, menthol-cetylpyridinium, ondansetron **OR** ondansetron (ZOFRAN) IV, oxyCODONE-acetaminophen **OR** [DISCONTINUED]  morphine injection, polyethylene glycol, prochlorperazine, sodium chloride flush  SUBJECTIVE:   24 hour events:  No events noted   She reports an oral medication gives her the sweats.  She otherwise has no new complaints.   Review of Systems  All other systems reviewed and are negative.     OBJECTIVE:   Blood pressure 109/82, pulse 88, temperature 98 F (36.7 C), temperature source Oral, resp. rate 20, height 5\' 6"  (1.676 m), weight 88 kg, SpO2 94 %. Body mass index is 31.33 kg/m.  Physical Exam Constitutional:      General: She is not in acute distress.    Appearance: Normal appearance.  HENT:     Head: Normocephalic and atraumatic.  Eyes:     Extraocular Movements: Extraocular movements intact.     Conjunctiva/sclera: Conjunctivae normal.  Pulmonary:  Effort: Pulmonary effort is normal. No respiratory distress.  Abdominal:     General: There is no distension.     Palpations: Abdomen is soft.  Musculoskeletal:        General: Normal range of motion.     Cervical back: Normal range of motion and neck supple.  Skin:    General: Skin is warm and dry.     Findings: No rash.  Neurological:     General: No focal deficit present.     Mental Status: She is alert and oriented to person, place, and time.  Psychiatric:        Mood and Affect: Mood normal.        Behavior: Behavior normal.       Lab Results: Lab Results  Component Value Date   WBC 9.7 11/08/2021   HGB 8.9 (L) 11/08/2021   HCT 31.4 (L) 11/08/2021   MCV 88.5 11/08/2021   PLT 155 11/08/2021    Lab Results  Component Value Date   NA 134 (L) 11/08/2021   K 3.8 11/08/2021   CO2 25 11/08/2021   GLUCOSE 88 11/08/2021   BUN 9 11/08/2021   CREATININE 0.57 11/08/2021   CALCIUM 7.9 (L) 11/08/2021   GFRNONAA >60 11/08/2021   GFRAA >60 08/07/2019    Lab Results  Component Value Date   ALT 7 10/25/2021   AST 13 (L) 10/25/2021   ALKPHOS 57 10/25/2021   BILITOT 0.2 (L) 10/25/2021       Component Value Date/Time   CRP 13.3 (H) 10/11/2021 0257       Component Value Date/Time   ESRSEDRATE 80 (H) 09/13/2021 0155     I have reviewed the micro and lab results in Epic.  Imaging: No results found.   Imaging independently reviewed in Epic.    Vedia Coffer for Infectious Disease [facility] [phone] pager 11/09/2021, 10:53 AM

## 2021-11-09 NOTE — Progress Notes (Addendum)
Pharmacy Antibiotic Note  Latoya [name] is a 36 y.o. female admitted on 10/11/2021 with acute BL PE and TV endocarditis. Patient with history of MSSA and MRSA bacteremia c/b endocarditis and lumbar discitis. Pt recently completed 6 weeks of vancomycin and was started on linezolid after. Pharmacy has been consulted for vancomycin. Micafungin ordered as well with C. Albicans growing in blood. ID following.  -SCr= 0.57   Plan: -Continue vancomycin to 1250mg  Q12hr (EstAUC = 461) -Continue Micafungin 150 mg IV every 24 hours per ID  -Monitor cultures, clinical status, renal function, vancomycin levels as needed     Height: 5\' 6"  (167.6 cm) Weight: 88 kg (194 lb 1.6 oz) IBW/kg (Calculated) : 59.3  Temp (24hrs), Avg:97.9 F (36.6 C), Min:97.7 F (36.5 C), Max:98 F (36.7 C)  Recent Labs  Lab 11/03/21 1241 11/03/21 2040 11/04/21 0316 11/05/21 0516 11/06/21 0105 11/06/21 1055 11/06/21 1700 11/07/21 0508 11/08/21 0500 11/08/21 0623 11/08/21 0814  WBC  --   --   --  10.0  --   --   --   --  8.6 9.7  --   CREATININE  --   --  0.70 0.60 0.70  --   --  0.81  --   --  0.57  VANCOTROUGH  --  18  --   --   --   --  12*  --   --   --   --   VANCOPEAK >60*  --   --   --   --  28*  --   --   --   --   --   VANCORANDOM  --   --  11  --   --   --   --   --   --   --   --      Estimated Creatinine Clearance: 110.7 mL/min (by C-G formula based on SCr of 0.57 mg/dL).    Allergies  Allergen Reactions   Naltrexone Anxiety    Severe anxiety Restlessness  Vomiting     Antimicrobials this admission: Vancomycin 6/7 >>  Cefepime 6/7 >> 6/12; 6/30 >> Micafungin 6/8 >>  Vancomycin levels 6/23 Vanc levels- AUC 510  6/30 VP > 60 - possibly contaminated by drug - decr'd to 1g q12 for safety          VR 18/ VT 11- AUC 409  7/3 VP 28/VT12 AUC 369> incr 1250 q12     Microbiology results: 6/7 BCx: C. Albicans 6/7 MRSA PCR: negative 6/9 resp- normal flora 6/10 blood x2- neg  Thank you for  allowing pharmacy to be a part of this patient's care.  8/7, PharmD Clinical Pharmacist **Pharmacist phone directory can now be found on [email or web address] ([email or web address]).  Listed under [facility].

## 2021-11-10 ENCOUNTER — Inpatient Hospital Stay (HOSPITAL_COMMUNITY): Payer: Medicaid Other

## 2021-11-10 DIAGNOSIS — I079 Rheumatic tricuspid valve disease, unspecified: Secondary | ICD-10-CM | POA: Diagnosis not present

## 2021-11-10 DIAGNOSIS — B377 Candidal sepsis: Secondary | ICD-10-CM | POA: Diagnosis not present

## 2021-11-10 DIAGNOSIS — M4646 Discitis, unspecified, lumbar region: Secondary | ICD-10-CM | POA: Diagnosis not present

## 2021-11-10 DIAGNOSIS — B9562 Methicillin resistant Staphylococcus aureus infection as the cause of diseases classified elsewhere: Secondary | ICD-10-CM | POA: Diagnosis not present

## 2021-11-10 DIAGNOSIS — R7881 Bacteremia: Secondary | ICD-10-CM | POA: Diagnosis not present

## 2021-11-10 DIAGNOSIS — F1910 Other psychoactive substance abuse, uncomplicated: Secondary | ICD-10-CM | POA: Diagnosis not present

## 2021-11-10 DIAGNOSIS — I2692 Saddle embolus of pulmonary artery without acute cor pulmonale: Secondary | ICD-10-CM | POA: Diagnosis not present

## 2021-11-10 LAB — BASIC METABOLIC PANEL
Anion gap: 10 (ref 5–15)
BUN: 8 mg/dL (ref 6–20)
CO2: 25 mmol/L (ref 22–32)
Calcium: 8 mg/dL — ABNORMAL LOW (ref 8.9–10.3)
Chloride: 99 mmol/L (ref 98–111)
Creatinine, Ser: 0.59 mg/dL (ref 0.44–1.00)
GFR, Estimated: >60 mL/min (ref >60)
Glucose, Bld: 127 mg/dL — ABNORMAL HIGH (ref 70–99)
Potassium: 3.8 mmol/L (ref 3.5–5.1)
Sodium: 134 mmol/L — ABNORMAL LOW (ref 135–145)

## 2021-11-10 LAB — MAGNESIUM: Magnesium: 1.9 mg/dL (ref 1.7–2.4)

## 2021-11-10 LAB — VANCOMYCIN, PEAK: Vancomycin Pk: >60 ug/mL (ref 30–40)

## 2021-11-10 LAB — VANCOMYCIN, TROUGH: Vancomycin Tr: 11 ug/mL — ABNORMAL LOW (ref 15–20)

## 2021-11-10 MED ORDER — LIDOCAINE HCL 1 % IJ SOLN
INTRAMUSCULAR | Status: AC
  Filled 2021-11-10: qty 20

## 2021-11-10 MED ORDER — CHLORHEXIDINE GLUCONATE 4 % EX LIQD
CUTANEOUS | Status: AC
  Filled 2021-11-10: qty 15

## 2021-11-10 NOTE — Procedures (Signed)
Successful replacement of left double lumen PICC line. Length 37cm Tip at lower SVC/RA PICC capped No complications Ready for use.  EBL < 5 mL   Caran Storck PA-C 11/10/2021 4:04 PM

## 2021-11-10 NOTE — Progress Notes (Signed)
[facility]                                                                              [name], is a 36 y.o. female, DOB - 1986/01/11, YYQ:[id number] Admit date - 10/11/2021    Outpatient Primary MD for the patient is Pcp, No  LOS - 30  days       Brief summary   Latoya [name] is a 36 y.o. female with a history of MSSA tricuspid valve endocarditis, MRSA L4-L5 facet join septic discitis in 07/2019 and more recently diagnosis of MRSA bacteremia with infective endocarditis and tricuspid valve vegetation diagnosed 07/2021 who presented from [facility] for saddle pulmonary embolism.  Blood cultures growing candida albicans.  MRI L spine with findings concerning for septic arthritis.    ID is driving antimicrobial management, currently with vancomycin and micafungin.   Cardiothoracic surgery was consulted, noting evidence of PFO at previous Angiovac attempt, stating only option would be for surgical valve repair and PFO closure. They recommended outpatient follow up after sustained abstinence of illicit drugs.    Assessment & Plan    Principal Problem:   Acute saddle pulmonary embolism (HCC) -Failure to DOACs, developed while on eliquis.  No RV strain on echo -Patient feels uncomfortable with subcu injections.  Pharmacy was able to communicate with hematology, Dr. Myna Hidalgo who recommended Pradaxa Continue Pradaxa   Active Problems: Candidemia, tricuspid valve endocarditis, MRSA bacteremia, POA -Large vegetation. CT VS consulted, recommended follow-up after discharge for consideration of surgical valve repair and PFO closure after sustained abstinence of illicit drugs  -LUE PICC placed by IR 6/15, replaced 6/24 due to kinking.  Blood cultures cleared. -Continue IV vancomycin, micafungin, ID following  Shortness of breath, pulmonary edema with ?multifocal pneumonia Also noted to have a component of acute anxiety -Breathing much improved, will finish IV  Lasix today, transition to oral Lasix for 3 days in a.m.   Hypokalemia, hypomagnesemia -Replaced  Lumbar discitis -Pain currently controlled with current regimen -continue management as above for IV antibiotics   Sepsis, multifactorial, POA with candidemia, endocarditis, discitis -Sepsis physiology improving, Leukocytosis improving  Pneumonia left lower lobe HCAP versus septic emboli on prior admission -Continue IV antibiotics   IVDU history, reports abstinence since March 2023 -Continue abstinence, counseled cessation   Normocytic anemia -H&H stable 8.1  Thrombocytopenia -Resolved   Chronic pain, pleuritic chest pain -Continue Neurontin  Mild hyponatremia -Resolved, monitor with diuresis   Obesity Estimated body mass index is 32.07 kg/m as calculated from the following:   Height as of this encounter: 5\' 6"  (1.676 m).   Weight as of this encounter: 90.1 kg.  Code Status: Full CODE STATUS DVT Prophylaxis:   dabigatran (PRADAXA) capsule 150 mg   Level of Care: Level of care: Med-Surg Family Communication: Updated patient and her father at the bedside on 6/30 Disposition Plan:      Remains inpatient appropriate: On IV antibiotics    Consultants:   CT VS ID     Medications  benzonatate  200 mg Oral TID   Chlorhexidine Gluconate Cloth  6 each Topical Daily   dabigatran  150 mg  Oral Q12H   feeding supplement  237 mL Oral BID BM   ferrous gluconate  324 mg Oral Q breakfast   furosemide  40 mg Oral Daily   gabapentin  400 mg Oral TID   guaiFENesin  1,200 mg Oral BID   melatonin  3 mg Oral QHS   metoprolol tartrate  12.5 mg Oral BID   oxyCODONE  10 mg Oral Q12H   pantoprazole  40 mg Oral Daily   potassium chloride  40 mEq Oral Daily   sodium chloride flush  10-40 mL Intracatheter Q12H      Subjective:   Latoya [name] was seen and examined today.  She is on room air, no vomiting , reports poor appetite  Bp and heart rate improving  .  No fevers or  chills  RN reports picc line malfunction, IV team recommend IR consult   Objective:   Vitals:   11/09/21 0610 11/09/21 1632 11/09/21 2049 11/10/21 0448  BP: 109/82 108/83 (!) 109/92 102/77  Pulse: 88 92 89 90  Resp: 20 20 19 18   Temp: 98 F (36.7 C) 98 F (36.7 C) 97.9 F (36.6 C) 98.6 F (37 C)  TempSrc: Oral Tympanic Oral Oral  SpO2: 94% 96% 94% 92%  Weight: 88 kg   90.1 kg  Height:        Intake/Output Summary (Last 24 hours) at 11/10/2021 0814 Last data filed at 11/10/2021 0500 Gross per 24 hour  Intake 956 ml  Output --  Net 956 ml     Wt Readings from Last 3 Encounters:  11/10/21 90.1 kg  10/03/21 84 kg  09/26/21 82.6 kg    Physical Exam General: Alert and oriented x 3, NAD Cardiovascular: S1 S2 clear, RRR. No pedal edema b/l Respiratory: Fairly CTA B Gastrointestinal: Soft, nontender, nondistended, NBS Ext: no pedal edema bilaterally Neuro: no new deficits Psych: Normal affect and demeanor, alert and oriented x3    Data Reviewed:  I have personally reviewed following labs    CBC Lab Results  Component Value Date   WBC 9.7 11/08/2021   RBC 3.55 (L) 11/08/2021   HGB 8.9 (L) 11/08/2021   HCT 31.4 (L) 11/08/2021   MCV 88.5 11/08/2021   MCH 25.1 (L) 11/08/2021   PLT 155 11/08/2021   MCHC 28.3 (L) 11/08/2021   RDW 28.5 (H) 11/08/2021   LYMPHSABS 3.7 10/29/2021   MONOABS 1.3 (H) 10/29/2021   EOSABS 0.9 (H) 10/29/2021   BASOSABS 0.0 10/29/2021     Last metabolic panel Lab Results  Component Value Date   NA 134 (L) 11/10/2021   K 3.8 11/10/2021   CL 99 11/10/2021   CO2 25 11/10/2021   BUN 8 11/10/2021   CREATININE 0.59 11/10/2021   GLUCOSE 127 (H) 11/10/2021   GFRNONAA >60 11/10/2021   GFRAA >60 08/07/2019   CALCIUM 8.0 (L) 11/10/2021   PHOS 3.6 10/25/2021   PROT 7.5 10/25/2021   ALBUMIN 2.1 (L) 10/25/2021   BILITOT 0.2 (L) 10/25/2021   ALKPHOS 57 10/25/2021   AST 13 (L) 10/25/2021   ALT 7 10/25/2021   ANIONGAP 10 11/10/2021      01/11/2022 M.D. PhD FACP [facility] 11/10/2021, 8:14 AM  Available via Epic secure chat 7am-7pm After 7 pm, please refer to night coverage provider listed on amion.

## 2021-11-10 NOTE — Progress Notes (Addendum)
Pharmacy Antibiotic Note  Latoya [name] is a 36 y.o. female admitted on 10/11/2021 with acute BL PE and TV endocarditis. Patient with history of MSSA and MRSA bacteremia c/b endocarditis and lumbar discitis. Pt recently completed 6 weeks of vancomycin and was started on linezolid after. Pharmacy consulted on 11/03/21 for vancomycin. Micafungin ordered per ID as well with  C. Albicans growing in blood. ID following.    Vancomycin trough  = 11 @ 1600 today 11/10/21 ,on Vancomycin 1250 mg IV q12h.  -SCr= 0.59 stable.  Trough is okay.  Will check Vancomycin peak and trough on 7/8 to evaluate AUC.    Plan: -Continue vancomycin to 1250mg  Q12hr (EstAUC = 461)  Will check vancomycin peak and trough tomorrow to calculate the measured AUC -Continue Micafungin 150 mg IV every 24 hours per ID  -Monitor cultures, clinical status, renal function, vancomycin levels as needed     Height: 5\' 6"  (167.6 cm) Weight: 90.1 kg (198 lb 11.2 oz) IBW/kg (Calculated) : 59.3  Temp (24hrs), Avg:98.6 F (37 C), Min:97.6 F (36.4 C), Max:100.9 F (38.3 C)  Recent Labs  Lab 11/04/21 0316 11/05/21 0516 11/06/21 0105 11/06/21 1055 11/06/21 1700 11/07/21 0508 11/08/21 0500 11/08/21 0623 11/08/21 0814 11/10/21 0458 11/10/21 1600  WBC  --  10.0  --   --   --   --  8.6 9.7  --   --   --   CREATININE 0.70 0.60 0.70  --   --  0.81  --   --  0.57 0.59  --   VANCOTROUGH  --   --   --   --  12*  --   --   --   --   --  11*  VANCOPEAK  --   --   --  28*  --   --   --   --   --   --   --   VANCORANDOM 11  --   --   --   --   --   --   --   --   --   --      Estimated Creatinine Clearance: 112 mL/min (by C-G formula based on SCr of 0.59 mg/dL).    Allergies  Allergen Reactions   Naltrexone Anxiety    Severe anxiety Restlessness  Vomiting     Antimicrobials this admission: Vancomycin 6/7 >>  Cefepime 6/7 >> 6/12; 6/30 >> Micafungin 6/8 >>  Vancomycin levels 6/23 Vanc levels- AUC 510  6/30 VP > 60 -  possibly contaminated by drug - decr'd to 1g q12 for safety          VR 18/ VT 11- AUC 409  7/3 VP 28/VT12 AUC 369> incr 1250 q12  7/7 VT =11 on vanc 1250 mg q12h,  continue at same dose.     Microbiology results: 6/7 BCx: C. Albicans 6/7 MRSA PCR: negative 6/9 resp- normal flora 6/10 blood x2- neg  Thank you for allowing pharmacy to be a part of this patient's care.  8/7, RPh Clinical Pharmacist  **Pharmacist phone directory can now be found on [email or web address] ([email or web address]).  Listed under [facility].

## 2021-11-10 NOTE — Progress Notes (Signed)
Spoke with primary RN re: PICC that flush well but no blood return. Recommended to refer to IR for further evaluation.

## 2021-11-10 NOTE — Progress Notes (Signed)
Messaged Dr. Albertine Grates concerning pt needing IR for further eval of PICC line due to being unable to pull back blood on line.

## 2021-11-10 NOTE — Progress Notes (Signed)
Pharmacy Antibiotic Note  Latoya [name] is a 36 y.o. female admitted on 10/11/2021 with acute BL PE and TV endocarditis. Patient with history of MSSA and MRSA bacteremia c/b endocarditis and lumbar discitis. Pt recently completed 6 weeks of vancomycin and was started on linezolid after. Pharmacy consulted on 11/03/21 for vancomycin. Micafungin ordered per ID as well with  C. Albicans growing in blood. ID following.    Vancomycin trough  = 11 @ 1600 today 11/10/21 ,on Vancomycin 1250 mg IV q12h.  -SCr= 0.59 stable. Trough is okay. Plan to check peak post 7/7pm dose and extrapolate the values to calculate AUC.   Vanc peak >60 (RN thinks that since drawn from PICC port, it is possible there was residual Vanc in the port)  Plan: -Recheck Vanc peak and utilize to calculate AUC on current dose of 1250mg  IV q12h.    Height: 5\' 6"  (167.6 cm) Weight: 90.1 kg (198 lb 11.2 oz) IBW/kg (Calculated) : 59.3  Temp (24hrs), Avg:98.7 F (37.1 C), Min:97.6 F (36.4 C), Max:100.9 F (38.3 C)  Recent Labs  Lab 11/04/21 0316 11/05/21 0516 11/06/21 0105 11/06/21 1055 11/06/21 1700 11/07/21 0508 11/08/21 0500 11/08/21 0623 11/08/21 0814 11/10/21 0458 11/10/21 1600 11/10/21 2146  WBC  --  10.0  --   --   --   --  8.6 9.7  --   --   --   --   CREATININE 0.70 0.60 0.70  --   --  0.81  --   --  0.57 0.59  --   --   VANCOTROUGH  --   --   --   --  12*  --   --   --   --   --  11*  --   VANCOPEAK  --   --   --  28*  --   --   --   --   --   --   --  >60*  VANCORANDOM 11  --   --   --   --   --   --   --   --   --   --   --      Estimated Creatinine Clearance: 112 mL/min (by C-G formula based on SCr of 0.59 mg/dL).    Allergies  Allergen Reactions   Naltrexone Anxiety    Severe anxiety Restlessness  Vomiting     Antimicrobials this admission: Vancomycin 6/7 >>  Cefepime 6/7 >> 6/12; 6/30 >> Micafungin 6/8 >>  Vancomycin levels 6/23 Vanc levels- AUC 510  6/30 VP > 60 - possibly contaminated  by drug - decr'd to 1g q12 for safety          VR 18/ VT 11- AUC 409  7/3 VP 28/VT12 AUC 369> incr 1250 q12  7/7 VT =11 on vanc 1250 mg q12h,  continue at same dose.     Microbiology results: 6/7 BCx: C. Albicans 6/7 MRSA PCR: negative 6/9 resp- normal flora 6/10 blood x2- neg  Thank you for allowing pharmacy to be a part of this patient's care.  8/7, PharmD, BCPS Please see amion for complete clinical pharmacist phone list 11/10/2021 10:42 PM

## 2021-11-11 DIAGNOSIS — R7881 Bacteremia: Secondary | ICD-10-CM | POA: Diagnosis not present

## 2021-11-11 DIAGNOSIS — B377 Candidal sepsis: Secondary | ICD-10-CM | POA: Diagnosis not present

## 2021-11-11 DIAGNOSIS — B9562 Methicillin resistant Staphylococcus aureus infection as the cause of diseases classified elsewhere: Secondary | ICD-10-CM | POA: Diagnosis not present

## 2021-11-11 DIAGNOSIS — F1910 Other psychoactive substance abuse, uncomplicated: Secondary | ICD-10-CM | POA: Diagnosis not present

## 2021-11-11 DIAGNOSIS — I079 Rheumatic tricuspid valve disease, unspecified: Secondary | ICD-10-CM | POA: Diagnosis not present

## 2021-11-11 DIAGNOSIS — M4646 Discitis, unspecified, lumbar region: Secondary | ICD-10-CM | POA: Diagnosis not present

## 2021-11-11 DIAGNOSIS — I2692 Saddle embolus of pulmonary artery without acute cor pulmonale: Secondary | ICD-10-CM | POA: Diagnosis not present

## 2021-11-11 LAB — CBC
HCT: 35.3 % — ABNORMAL LOW (ref 36.0–46.0)
Hemoglobin: 10 g/dL — ABNORMAL LOW (ref 12.0–15.0)
MCH: 24.8 pg — ABNORMAL LOW (ref 26.0–34.0)
MCHC: 28.3 g/dL — ABNORMAL LOW (ref 30.0–36.0)
MCV: 87.4 fL (ref 80.0–100.0)
Platelets: 176 10*3/uL (ref 150–400)
RBC: 4.04 MIL/uL (ref 3.87–5.11)
RDW: 29.9 % — ABNORMAL HIGH (ref 11.5–15.5)
WBC: 13 10*3/uL — ABNORMAL HIGH (ref 4.0–10.5)
nRBC: 0.5 % — ABNORMAL HIGH (ref 0.0–0.2)

## 2021-11-11 LAB — VANCOMYCIN, PEAK: Vancomycin Pk: 27 ug/mL — ABNORMAL LOW (ref 30–40)

## 2021-11-11 MED ORDER — FUROSEMIDE 10 MG/ML IJ SOLN
20.0000 mg | Freq: Once | INTRAMUSCULAR | Status: AC
  Administered 2021-11-11: 20 mg via INTRAVENOUS
  Filled 2021-11-11: qty 2

## 2021-11-11 MED ORDER — VANCOMYCIN HCL IN DEXTROSE 1-5 GM/200ML-% IV SOLN
1000.0000 mg | Freq: Two times a day (BID) | INTRAVENOUS | Status: DC
  Administered 2021-11-11 – 2021-11-20 (×19): 1000 mg via INTRAVENOUS
  Filled 2021-11-11 (×19): qty 200

## 2021-11-11 NOTE — Plan of Care (Signed)
  Problem: Health Behavior/Discharge Planning: Goal: Ability to manage health-related needs will improve Outcome: Progressing   Problem: Clinical Measurements: Goal: Ability to maintain clinical measurements within normal limits will improve Outcome: Progressing Goal: Will remain free from infection Outcome: Progressing   

## 2021-11-11 NOTE — Progress Notes (Signed)
[facility]                                                                              Latoya [name], is a 36 y.o. female, DOB - 1986/04/01, MOQ:[id number] Admit date - 10/11/2021    Outpatient Primary MD for the patient is Pcp, No  LOS - 31  days       Brief summary   Latoya [name] is a 36 y.o. female with a history of MSSA tricuspid valve endocarditis, MRSA L4-L5 facet join septic discitis in 07/2019 and more recently diagnosis of MRSA bacteremia with infective endocarditis and tricuspid valve vegetation diagnosed 07/2021 who presented from [facility] for saddle pulmonary embolism.  Blood cultures growing candida albicans.  MRI L spine with findings concerning for septic arthritis.    ID is driving antimicrobial management, currently with vancomycin and micafungin.   Cardiothoracic surgery was consulted, noting evidence of PFO at previous Angiovac attempt, stating only option would be for surgical valve repair and PFO closure. They recommended outpatient follow up after sustained abstinence of illicit drugs.    Assessment & Plan    Principal Problem:   Acute saddle pulmonary embolism (HCC) -Failure to DOACs, developed while on eliquis.  No RV strain on echo -Patient feels uncomfortable with subcu injections.  Pharmacy was able to communicate with hematology, Dr. Myna Hidalgo who recommended Pradaxa Continue Pradaxa   Active Problems: Candidemia, tricuspid valve endocarditis, MRSA bacteremia, POA -Large vegetation. CT VS consulted, recommended follow-up after discharge for consideration of surgical valve repair and PFO closure after sustained abstinence of illicit drugs  -LUE PICC placed by IR 6/15, replaced 6/24  then on 7/7 due to kinking.  Blood cultures cleared. -Continue IV vancomycin, micafungin, ID following  Shortness of breath, pulmonary edema with ?multifocal pneumonia Also noted to have a component of acute anxiety -Breathing much improved, will  finish IV Lasix today, transition to oral Lasix for 3 days in a.m.   Hypokalemia, hypomagnesemia -Replaced  Lumbar discitis -Pain currently controlled with current regimen -continue management as above for IV antibiotics   Sepsis, multifactorial, POA with candidemia, endocarditis, discitis -Sepsis physiology improving, Leukocytosis improving  Pneumonia left lower lobe HCAP versus septic emboli on prior admission -Continue IV antibiotics   IVDU history, reports abstinence since March 2023 -Continue abstinence, counseled cessation   Normocytic anemia -H&H stable 8.1  Thrombocytopenia -Resolved   Chronic pain, pleuritic chest pain -Continue Neurontin  Mild hyponatremia -Resolved, monitor with diuresis   Obesity Estimated body mass index is 32.2 kg/m as calculated from the following:   Height as of this encounter: 5\' 6"  (1.676 m).   Weight as of this encounter: 90.5 kg.  Code Status: Full CODE STATUS DVT Prophylaxis:   dabigatran (PRADAXA) capsule 150 mg   Level of Care: Level of care: Med-Surg Family Communication: Updated patient and her father at the bedside on 6/30 Disposition Plan:      Remains inpatient appropriate: On IV antibiotics    Consultants:   CT VS ID     Medications  benzonatate  200 mg Oral TID   Chlorhexidine Gluconate Cloth  6 each Topical Daily  dabigatran  150 mg Oral Q12H   feeding supplement  237 mL Oral BID BM   ferrous gluconate  324 mg Oral Q breakfast   furosemide  20 mg Intravenous Once   gabapentin  400 mg Oral TID   guaiFENesin  1,200 mg Oral BID   melatonin  3 mg Oral QHS   oxyCODONE  10 mg Oral Q12H   pantoprazole  40 mg Oral Daily   potassium chloride  40 mEq Oral Daily   sodium chloride flush  10-40 mL Intracatheter Q12H      Subjective:   Latoya [name] was seen and examined today.  Tmax100.9 Bp and heart rate stable, tachycardia and hypotension has resolved She continue to appear to be volume overloaded, she  agreed with another dose of lasix She is on room air, no vomiting , reports poor appetite     Objective:   Vitals:   11/10/21 2035 11/11/21 0353 11/11/21 1021 11/11/21 1716  BP: (!) 116/92 (!) 128/91 (!) 118/91 (!) 118/98  Pulse: 88 90    Resp: 18 16 18 20   Temp: 98 F (36.7 C) 98.5 F (36.9 C) 98.2 F (36.8 C) 97.9 F (36.6 C)  TempSrc: Oral Oral Oral Oral  SpO2: 95% 96%    Weight:  90.5 kg    Height:        Intake/Output Summary (Last 24 hours) at 11/11/2021 1905 Last data filed at 11/11/2021 1705 Gross per 24 hour  Intake 2457.44 ml  Output --  Net 2457.44 ml     Wt Readings from Last 3 Encounters:  11/11/21 90.5 kg  10/03/21 84 kg  09/26/21 82.6 kg    Physical Exam General: Alert and oriented x 3, NAD Cardiovascular: S1 S2 clear, RRR. No pedal edema b/l Respiratory: Fairly CTA B Gastrointestinal: Soft, nontender, nondistended, NBS Ext: pedal edema bilaterally Neuro: no new deficits Psych: Normal affect and demeanor, alert and oriented x3    Data Reviewed:  I have personally reviewed following labs    CBC Lab Results  Component Value Date   WBC 13.0 (H) 11/11/2021   RBC 4.04 11/11/2021   HGB 10.0 (L) 11/11/2021   HCT 35.3 (L) 11/11/2021   MCV 87.4 11/11/2021   MCH 24.8 (L) 11/11/2021   PLT 176 11/11/2021   MCHC 28.3 (L) 11/11/2021   RDW 29.9 (H) 11/11/2021   LYMPHSABS 3.7 10/29/2021   MONOABS 1.3 (H) 10/29/2021   EOSABS 0.9 (H) 10/29/2021   BASOSABS 0.0 10/29/2021     Last metabolic panel Lab Results  Component Value Date   NA 134 (L) 11/10/2021   K 3.8 11/10/2021   CL 99 11/10/2021   CO2 25 11/10/2021   BUN 8 11/10/2021   CREATININE 0.59 11/10/2021   GLUCOSE 127 (H) 11/10/2021   GFRNONAA >60 11/10/2021   GFRAA >60 08/07/2019   CALCIUM 8.0 (L) 11/10/2021   PHOS 3.6 10/25/2021   PROT 7.5 10/25/2021   ALBUMIN 2.1 (L) 10/25/2021   BILITOT 0.2 (L) 10/25/2021   ALKPHOS 57 10/25/2021   AST 13 (L) 10/25/2021   ALT 7 10/25/2021    ANIONGAP 10 11/10/2021     01/11/2022 M.D. PhD FACP [facility] 11/11/2021, 7:05 PM  Available via Epic secure chat 7am-7pm After 7 pm, please refer to night coverage provider listed on amion.

## 2021-11-11 NOTE — Progress Notes (Signed)
Pharmacy Antibiotic Note  Latoya [name] is a 36 y.o. female admitted on 10/11/2021 with acute BL PE and TV endocarditis. Patient with history of MSSA and MRSA bacteremia c/b endocarditis and lumbar discitis. Pt recently completed 6 weeks of vancomycin and was started on linezolid after. Pharmacy consulted on 11/03/21 for vancomycin. Micafungin ordered per ID as well with  C. Albicans growing in blood. ID following.   Vancomycin trough  = 11 @ 1600 today 11/10/21 ,on Vancomycin 1250 mg IV q12h.  -SCr= 0.59 stable. Trough is okay. Repeat peak collect ~5.5 hours after infusion. Extrapolated AUC high,  will reduce dose to aim for AUC ~500.  Plan: -Vancomycin 1000mg  IV q12 hours (eAUC 510)   Height: 5\' 6"  (167.6 cm) Weight: 90.1 kg (198 lb 11.2 oz) IBW/kg (Calculated) : 59.3  Temp (24hrs), Avg:98.7 F (37.1 C), Min:97.6 F (36.4 C), Max:100.9 F (38.3 C)  Recent Labs  Lab 11/05/21 0516 11/06/21 0105 11/06/21 1055 11/06/21 1700 11/07/21 0508 11/08/21 0500 11/08/21 0623 11/08/21 0814 11/10/21 0458 11/10/21 1600 11/10/21 2146 11/11/21 0036  WBC 10.0  --   --   --   --  8.6 9.7  --   --   --   --   --   CREATININE 0.60 0.70  --   --  0.81  --   --  0.57 0.59  --   --   --   VANCOTROUGH  --   --   --  12*  --   --   --   --   --  11*  --   --   VANCOPEAK  --   --    < >  --   --   --   --   --   --   --  >60* 27*   < > = values in this interval not displayed.     Estimated Creatinine Clearance: 112 mL/min (by C-G formula based on SCr of 0.59 mg/dL).    Allergies  Allergen Reactions   Naltrexone Anxiety    Severe anxiety Restlessness  Vomiting     Antimicrobials this admission: Vancomycin 6/7 >>  Cefepime 6/7 >> 6/12; 6/30 >> Micafungin 6/8 >>  Vancomycin levels 6/23 Vanc levels- AUC 510  6/30 VP > 60 - possibly contaminated by drug - decr'd to 1g q12 for safety          VR 18/ VT 11- AUC 409  7/3 VP 28/VT12 AUC 369> incr 1250 q12  7/7 VT =11 on vanc 1250 mg q12h,   continue at same dose.  7/8 extrapolated AUC 620, will reduce to 1000mg  Q12 hours    Microbiology results: 6/7 BCx: C. Albicans 6/7 MRSA PCR: negative 6/9 resp- normal flora 6/10 blood x2- neg  Thank you for allowing pharmacy to be a part of this patient's care.  8/7, PharmD Clinical Pharmacist 11/11/2021 3:31 AM Please check AMION for all [facility] numbers

## 2021-11-12 DIAGNOSIS — B9562 Methicillin resistant Staphylococcus aureus infection as the cause of diseases classified elsewhere: Secondary | ICD-10-CM | POA: Diagnosis not present

## 2021-11-12 DIAGNOSIS — I079 Rheumatic tricuspid valve disease, unspecified: Secondary | ICD-10-CM | POA: Diagnosis not present

## 2021-11-12 DIAGNOSIS — F1910 Other psychoactive substance abuse, uncomplicated: Secondary | ICD-10-CM | POA: Diagnosis not present

## 2021-11-12 DIAGNOSIS — R7881 Bacteremia: Secondary | ICD-10-CM | POA: Diagnosis not present

## 2021-11-12 DIAGNOSIS — M4646 Discitis, unspecified, lumbar region: Secondary | ICD-10-CM | POA: Diagnosis not present

## 2021-11-12 DIAGNOSIS — B377 Candidal sepsis: Secondary | ICD-10-CM | POA: Diagnosis not present

## 2021-11-12 DIAGNOSIS — I2692 Saddle embolus of pulmonary artery without acute cor pulmonale: Secondary | ICD-10-CM | POA: Diagnosis not present

## 2021-11-12 LAB — TSH: TSH: 1.438 u[IU]/mL (ref 0.350–4.500)

## 2021-11-12 LAB — BASIC METABOLIC PANEL
Anion gap: 9 (ref 5–15)
BUN: 6 mg/dL (ref 6–20)
CO2: 25 mmol/L (ref 22–32)
Calcium: 8.2 mg/dL — ABNORMAL LOW (ref 8.9–10.3)
Chloride: 101 mmol/L (ref 98–111)
Creatinine, Ser: 0.55 mg/dL (ref 0.44–1.00)
GFR, Estimated: >60 mL/min (ref >60)
Glucose, Bld: 184 mg/dL — ABNORMAL HIGH (ref 70–99)
Potassium: 3.3 mmol/L — ABNORMAL LOW (ref 3.5–5.1)
Sodium: 135 mmol/L (ref 135–145)

## 2021-11-12 MED ORDER — BENZONATATE 100 MG PO CAPS
200.0000 mg | ORAL_CAPSULE | Freq: Three times a day (TID) | ORAL | Status: DC | PRN
  Administered 2021-11-13 – 2021-11-22 (×2): 200 mg via ORAL
  Filled 2021-11-12 (×2): qty 2

## 2021-11-12 MED ORDER — POLYETHYLENE GLYCOL 3350 17 G PO PACK
17.0000 g | PACK | Freq: Every day | ORAL | Status: DC
  Administered 2021-11-23: 17 g via ORAL
  Filled 2021-11-12 (×6): qty 1

## 2021-11-12 MED ORDER — FUROSEMIDE 10 MG/ML IJ SOLN
20.0000 mg | Freq: Once | INTRAMUSCULAR | Status: AC
  Administered 2021-11-12: 20 mg via INTRAVENOUS
  Filled 2021-11-12: qty 2

## 2021-11-12 MED ORDER — SENNOSIDES-DOCUSATE SODIUM 8.6-50 MG PO TABS
1.0000 | ORAL_TABLET | Freq: Every day | ORAL | Status: DC
  Administered 2021-11-12 – 2021-11-23 (×6): 1 via ORAL
  Filled 2021-11-12 (×12): qty 1

## 2021-11-12 MED ORDER — POTASSIUM CHLORIDE CRYS ER 20 MEQ PO TBCR
40.0000 meq | EXTENDED_RELEASE_TABLET | Freq: Once | ORAL | Status: AC
  Administered 2021-11-12: 40 meq via ORAL
  Filled 2021-11-12: qty 2

## 2021-11-12 MED ORDER — GUAIFENESIN ER 600 MG PO TB12
1200.0000 mg | ORAL_TABLET | Freq: Two times a day (BID) | ORAL | Status: DC | PRN
Start: 2021-11-12 — End: 2021-11-18
  Administered 2021-11-14 – 2021-11-17 (×2): 1200 mg via ORAL
  Filled 2021-11-12 (×2): qty 2

## 2021-11-12 NOTE — Progress Notes (Addendum)
[facility]                                                                              Latoya [name], is a 36 y.o. female, DOB - 10/27/1985, UKG:[id number] Admit date - 10/11/2021    Outpatient Primary MD for the patient is Pcp, No  LOS - 32  days       Brief summary   Latoya [name] is a 36 y.o. female with a history of MSSA tricuspid valve endocarditis, MRSA L4-L5 facet join septic discitis in 07/2019 and more recently diagnosis of MRSA bacteremia with infective endocarditis and tricuspid valve vegetation diagnosed 07/2021 who presented from [facility] for saddle pulmonary embolism.  Blood cultures growing candida albicans.  MRI L spine with findings concerning for septic arthritis.    ID is driving antimicrobial management, currently with vancomycin and micafungin.   Cardiothoracic surgery was consulted, noting evidence of PFO at previous Angiovac attempt, stating only option would be for surgical valve repair and PFO closure. They recommended outpatient follow up after sustained abstinence of illicit drugs.    Assessment & Plan      Acute saddle pulmonary embolism (HCC) -Failure to DOACs, developed while on eliquis.  No RV strain on echo -Patient feels uncomfortable with subcu injections.  Pharmacy was able to communicate with hematology, Dr. Myna Hidalgo who recommended Pradaxa -Continue Pradaxa   Sepsis, multifactorial, POA with candidemia, endocarditis, discitis IVDU history, reports abstinence since March 2023 -Continue abstinence, counseled cessation -Sepsis physiology improving  Pneumonia left lower lobe HCAP versus septic emboli on prior admission -Continue IV antibiotics  Candidemia, tricuspid valve endocarditis, MRSA bacteremia, POA -Large vegetation. CT VS consulted, recommended follow-up after discharge for consideration of surgical valve repair and PFO closure after sustained abstinence of illicit drugs  -LUE PICC placed by IR 6/15, replaced  6/24  then on 7/7 due to kinking.  Blood cultures cleared. -Continue IV vancomycin, micafungin, ID following  Lumbar discitis -Pain currently controlled with current regimen -continue management as above for IV antibiotics  Volume overload, pulmonary edema with ?multifocal pneumonia - iv lasix prn, already on abx   Hypokalemia, hypomagnesemia -Replaced    Mild hyponatremia -Resolved, monitor with diuresis   Normocytic anemia -H&H stable  Thrombocytopenia -Resolved   Chronic pain, pleuritic chest pain -Continue Neurontin    Obesity Estimated body mass index is 32.31 kg/m as calculated from the following:   Height as of this encounter: 5\' 6"  (1.676 m).   Weight as of this encounter: 90.8 kg.  Code Status: Full CODE STATUS DVT Prophylaxis:   dabigatran (PRADAXA) capsule 150 mg   Level of Care: Level of care: Med-Surg Family Communication: Updated patient and her father at the bedside on 6/30 Disposition Plan:      Remains inpatient appropriate: On IV antibiotics    Consultants:   CT VS ID     Medications  Chlorhexidine Gluconate Cloth  6 each Topical Daily   dabigatran  150 mg Oral Q12H   feeding supplement  237 mL Oral BID BM   ferrous gluconate  324 mg Oral Q breakfast   gabapentin  400 mg Oral TID  melatonin  3 mg Oral QHS   oxyCODONE  10 mg Oral Q12H   pantoprazole  40 mg Oral Daily   polyethylene glycol  17 g Oral Daily   potassium chloride  40 mEq Oral Daily   potassium chloride  40 mEq Oral Once   senna-docusate  1 tablet Oral QHS   sodium chloride flush  10-40 mL Intracatheter Q12H      Subjective:    Tmax100.9  (10 am on 7/7) No fever last 24hrs,  Bp and heart rate stable, tachycardia and hypotension has resolved She appears to be comfortable, she is on room air,     Objective:   Vitals:   11/11/21 1021 11/11/21 1716 11/11/21 2010 11/12/21 0438  BP: (!) 118/91 (!) 118/98 (!) 119/91 (!) 119/94  Pulse:   83 (!) 104  Resp: 18 20  19 18   Temp: 98.2 F (36.8 C) 97.9 F (36.6 C) 98 F (36.7 C) 99.1 F (37.3 C)  TempSrc: Oral Oral Oral Oral  SpO2:   95% 94%  Weight:    90.8 kg  Height:        Intake/Output Summary (Last 24 hours) at 11/12/2021 1314 Last data filed at 11/12/2021 0441 Gross per 24 hour  Intake 1188 ml  Output --  Net 1188 ml     Wt Readings from Last 3 Encounters:  11/12/21 90.8 kg  10/03/21 84 kg  09/26/21 82.6 kg    Physical Exam General: Alert and oriented x 3, NAD Cardiovascular: S1 S2 clear, RRR. No pedal edema b/l Respiratory: Fairly CTA B Gastrointestinal: Soft, nontender, nondistended, NBS Ext: pedal edema bilaterally Neuro: no new deficits Psych: Normal affect and demeanor, alert and oriented x3    Data Reviewed:  I have personally reviewed following labs    CBC Lab Results  Component Value Date   WBC 13.0 (H) 11/11/2021   RBC 4.04 11/11/2021   HGB 10.0 (L) 11/11/2021   HCT 35.3 (L) 11/11/2021   MCV 87.4 11/11/2021   MCH 24.8 (L) 11/11/2021   PLT 176 11/11/2021   MCHC 28.3 (L) 11/11/2021   RDW 29.9 (H) 11/11/2021   LYMPHSABS 3.7 10/29/2021   MONOABS 1.3 (H) 10/29/2021   EOSABS 0.9 (H) 10/29/2021   BASOSABS 0.0 10/29/2021     Last metabolic panel Lab Results  Component Value Date   NA 135 11/12/2021   K 3.3 (L) 11/12/2021   CL 101 11/12/2021   CO2 25 11/12/2021   BUN 6 11/12/2021   CREATININE 0.55 11/12/2021   GLUCOSE 184 (H) 11/12/2021   GFRNONAA >60 11/12/2021   GFRAA >60 08/07/2019   CALCIUM 8.2 (L) 11/12/2021   PHOS 3.6 10/25/2021   PROT 7.5 10/25/2021   ALBUMIN 2.1 (L) 10/25/2021   BILITOT 0.2 (L) 10/25/2021   ALKPHOS 57 10/25/2021   AST 13 (L) 10/25/2021   ALT 7 10/25/2021   ANIONGAP 9 11/12/2021     01/13/2022 M.D. PhD FACP [facility] 11/12/2021, 1:14 PM  Available via Epic secure chat 7am-7pm After 7 pm, please refer to night coverage provider listed on amion.

## 2021-11-13 ENCOUNTER — Other Ambulatory Visit (HOSPITAL_COMMUNITY): Payer: Self-pay

## 2021-11-13 DIAGNOSIS — I2692 Saddle embolus of pulmonary artery without acute cor pulmonale: Secondary | ICD-10-CM | POA: Diagnosis not present

## 2021-11-13 LAB — BASIC METABOLIC PANEL
Anion gap: 9 (ref 5–15)
BUN: 6 mg/dL (ref 6–20)
CO2: 27 mmol/L (ref 22–32)
Calcium: 8.4 mg/dL — ABNORMAL LOW (ref 8.9–10.3)
Chloride: 101 mmol/L (ref 98–111)
Creatinine, Ser: 0.68 mg/dL (ref 0.44–1.00)
GFR, Estimated: >60 mL/min (ref >60)
Glucose, Bld: 148 mg/dL — ABNORMAL HIGH (ref 70–99)
Potassium: 4.3 mmol/L (ref 3.5–5.1)
Sodium: 137 mmol/L (ref 135–145)

## 2021-11-13 MED ORDER — HYDROMORPHONE HCL 1 MG/ML IJ SOLN: 0.5000 mg | INTRAMUSCULAR | Status: DC | PRN

## 2021-11-13 NOTE — Progress Notes (Signed)
PROGRESS NOTE  Latoya [name] ZOX:[id number] DOB: 1985/11/19 DOA: 10/11/2021 PCP: Pcp, No  HPI/Recap of past 71 hours: 36 year old female past medical history of intravenous drug abuse (now reports abstinence),  MSSA tricuspid valve endocarditis in 2020 (Treated with angiovac and course of Cefazolin), MRSA L4-L5 facet join septic discitis in 07/2019, MRSA bacteremia with infective endocarditis and tricuspid valve vegetation diagnosed 07/24/21 who presented as a transfer from [facility] for saddle pulmonary embolism.  Blood cultures drawn on 10/11/2021 have grown Candida albicans, with negative repeat culture on 10/14/2021.  MRI L-spine done on 10/07/2021 with findings concerning for septic arthritis.  She is currently on micafungin and IV vancomycin, with infectious disease guidance, tentative end date 11/25/2021.  Cardiothoracic surgery was consulted, noting evidence of PFO at previous Angiovac attempt, stating only option would be for surgical valve repair and PFO closure. They recommended outpatient follow up after sustained abstinence of illicit drugs.   11/13/2021:  The patient was seen and examined at her bedside.  She reports 8 out of 10 pleuritic pain more than 10 hours prior to requesting pain medications.  No hemoptysis.   Assessment/Plan: Principal Problem:   Acute saddle pulmonary embolism (HCC) Active Problems:   Candidemia (HCC)   Endocarditis of tricuspid valve   Lumbar discitis   Pneumonia of left lower lobe due to infectious organism   MRSA bacteremia   IV drug abuse (HCC)   Hyponatremia   Chronic pain   Thrombocytopenia (HCC)   Pleuritic chest pain  Acute saddle pulmonary embolism (HCC) -Failure to DOACs, developed while on eliquis.  No RV strain on echo -Patient feels uncomfortable with subcu injections.  Pharmacy was able to communicate with hematology, Dr. Myna Hidalgo who recommended Pradaxa -Continue Pradaxa as recommended by hematology oncology. Pain control and  bowel regimen   Sepsis, multifactorial, POA with candidemia, endocarditis, discitis IVDU history, reports abstinence since March 2023 -Continue IV drug use abstinence.  Was counseled on cessation. -Sepsis physiology improving -Complete course of IV antibiotics 11/25/2021 as recommended by infectious disease.   Pneumonia left lower lobe HCAP versus septic emboli on prior admission -Continue IV antibiotics   Candidemia, tricuspid valve endocarditis, MRSA bacteremia, POA -Large vegetation. CT VS consulted, recommended follow-up after discharge for consideration of surgical valve repair and PFO closure after sustained abstinence of illicit drugs  -LUE PICC placed by IR 6/15, replaced 6/24  then on 7/7 due to kinking.  Blood cultures cleared. -Continue IV vancomycin, micafungin, ID following   Lumbar discitis -Pain currently controlled with current regimen -continue management as above for IV antibiotics   Volume overload, pulmonary edema  Diuretics as needed. Not hypoxic with O2 saturation 96% on room air.   Resolved hypokalemia, hypomagnesemia Replete electrolytes as indicated.   Resolved mild hyponatremia Encourage adequate oral intake.   Chronic normocytic anemia Last hemoglobin stable No overt bleeding reported   Resolved thrombocytopenia   Chronic pain, pleuritic chest pain -Continue Neurontin 400 mg 3 times daily   Obesity Estimated body mass index is 32.31 kg/m as calculated from the following:   Height as of this encounter: 5\' 6"  (1.676 m).   Weight as of this encounter: 90.8 kg. Recommend weight loss outpatient with regular physical activity and healthy dieting.     Critical care time: 65 minutes      Code Status: Full CODE STATUS DVT Prophylaxis:   dabigatran (PRADAXA) capsule 150 mg    Level of Care: Level of care: Med-Surg Family Communication: Updated patient and her father at the  bedside on 6/30 Disposition Plan:      Remains inpatient appropriate:  On IV antibiotics, with tentative end date 11/25/2021.  As recommended by infectious disease       Consultants:   CT VS ID      Status is: Inpatient The patient requires at least 2 midnights for further evaluation and treatment of present condition.    Objective: Vitals:   11/12/21 0438 11/12/21 1349 11/12/21 2155 11/13/21 0403  BP: (!) 119/94 120/90 117/88 (!) 121/93  Pulse: (!) 104  83 96  Resp: 18 18 16 18   Temp: 99.1 F (37.3 C) 97.6 F (36.4 C) 98.4 F (36.9 C) 98.5 F (36.9 C)  TempSrc: Oral Oral Oral Oral  SpO2: 94%  98% 96%  Weight: 90.8 kg   90.6 kg  Height:        Intake/Output Summary (Last 24 hours) at 11/13/2021 0850 Last data filed at 11/13/2021 0446 Gross per 24 hour  Intake 1400.94 ml  Output --  Net 1400.94 ml   Filed Weights   11/11/21 0353 11/12/21 0438 11/13/21 0403  Weight: 90.5 kg 90.8 kg 90.6 kg    Exam:  General: 36 y.o. year-old female well developed well nourished in no acute distress.  Alert and oriented x3. Cardiovascular: Regular rate and rhythm with no rubs or gallops.  No thyromegaly or JVD noted.   Respiratory: Clear to auscultation with no wheezes or rales.  Poor inspiratory effort. Abdomen: Soft nontender nondistended with normal bowel sounds x4 quadrants. Musculoskeletal: Pedal lower extremity edema bilaterally. Psychiatry: Mood is appropriate for condition and setting   Data Reviewed: CBC: Recent Labs  Lab 11/08/21 0500 11/08/21 0623 11/11/21 0558  WBC 8.6 9.7 13.0*  HGB 6.6* 8.9* 10.0*  HCT 24.5* 31.4* 35.3*  MCV 90.4 88.5 87.4  PLT 126* 155 0000000   Basic Metabolic Panel: Recent Labs  Lab 11/07/21 0508 11/08/21 0814 11/10/21 0458 11/12/21 0500 11/13/21 0449  NA 136 134* 134* 135 137  K 3.9 3.8 3.8 3.3* 4.3  CL 98 97* 99 101 101  CO2 28 25 25 25 27   GLUCOSE 109* 88 127* 184* 148*  BUN 9 9 8 6 6   CREATININE 0.81 0.57 0.59 0.55 0.68  CALCIUM 8.0* 7.9* 8.0* 8.2* 8.4*  MG 2.0  --  1.9  --   --     GFR: Estimated Creatinine Clearance: 112.3 mL/min (by C-G formula based on SCr of 0.68 mg/dL). Liver Function Tests: No results for input(s): "AST", "ALT", "ALKPHOS", "BILITOT", "PROT", "ALBUMIN" in the last 168 hours. No results for input(s): "LIPASE", "AMYLASE" in the last 168 hours. No results for input(s): "AMMONIA" in the last 168 hours. Coagulation Profile: No results for input(s): "INR", "PROTIME" in the last 168 hours. Cardiac Enzymes: No results for input(s): "CKTOTAL", "CKMB", "CKMBINDEX", "TROPONINI" in the last 168 hours. BNP (last 3 results) No results for input(s): "PROBNP" in the last 8760 hours. HbA1C: No results for input(s): "HGBA1C" in the last 72 hours. CBG: No results for input(s): "GLUCAP" in the last 168 hours. Lipid Profile: No results for input(s): "CHOL", "HDL", "LDLCALC", "TRIG", "CHOLHDL", "LDLDIRECT" in the last 72 hours. Thyroid Function Tests: Recent Labs    11/12/21 0500  TSH 1.438   Anemia Panel: No results for input(s): "VITAMINB12", "FOLATE", "FERRITIN", "TIBC", "IRON", "RETICCTPCT" in the last 72 hours. Urine analysis:    Component Value Date/Time   COLORURINE STRAW (A) 10/22/2021 2324   APPEARANCEUR CLEAR 10/22/2021 2324   LABSPEC 1.004 (L) 10/22/2021 2324  PHURINE 6.0 10/22/2021 2324   GLUCOSEU NEGATIVE 10/22/2021 2324   HGBUR SMALL (A) 10/22/2021 2324   BILIRUBINUR NEGATIVE 10/22/2021 2324   BILIRUBINUR negative 06/21/2021 1755   KETONESUR NEGATIVE 10/22/2021 2324   PROTEINUR NEGATIVE 10/22/2021 2324   UROBILINOGEN 0.2 06/21/2021 1755   NITRITE NEGATIVE 10/22/2021 2324   LEUKOCYTESUR NEGATIVE 10/22/2021 2324   Sepsis Labs: @LABRCNTIP (procalcitonin:4,lacticidven:4)  )No results found for this or any previous visit (from the past 240 hour(s)).    Studies: No results found.  Scheduled Meds:  Chlorhexidine Gluconate Cloth  6 each Topical Daily   dabigatran  150 mg Oral Q12H   feeding supplement  237 mL Oral BID BM    ferrous gluconate  324 mg Oral Q breakfast   gabapentin  400 mg Oral TID   melatonin  3 mg Oral QHS   oxyCODONE  10 mg Oral Q12H   pantoprazole  40 mg Oral Daily   polyethylene glycol  17 g Oral Daily   potassium chloride  40 mEq Oral Daily   senna-docusate  1 tablet Oral QHS   sodium chloride flush  10-40 mL Intracatheter Q12H    Continuous Infusions:  sodium chloride 10 mL/hr at 11/12/21 0611   micafungin (MYCAMINE) 150 mg in sodium chloride 0.9 % 100 mL IVPB 150 mg (11/12/21 1635)   vancomycin 1,000 mg (11/13/21 0627)     LOS: 33 days     01/14/22, MD [facility] Pager [phone]  If 7PM-7AM, please contact night-coverage [email or web address] Password [email or web address] 11/13/2021, 8:50 AM

## 2021-11-13 NOTE — Progress Notes (Signed)
Mobility Specialist Progress Note    11/13/21 1614  Mobility  Activity Refused mobility   Pt stated she was waiting for pain meds. RN advised they were just given.   [place] [facility] Specialist

## 2021-11-13 NOTE — Progress Notes (Signed)
Spoke with Dr. Margo Aye and made her aware patient had not had her percocet in almost 12 hours and was just given. Made her aware she had not been getting IV pain meds in weeks. Dilaudid discontinued. Emelda Brothers RN

## 2021-11-14 DIAGNOSIS — G8929 Other chronic pain: Secondary | ICD-10-CM | POA: Diagnosis not present

## 2021-11-14 DIAGNOSIS — M4646 Discitis, unspecified, lumbar region: Secondary | ICD-10-CM | POA: Diagnosis not present

## 2021-11-14 DIAGNOSIS — I2692 Saddle embolus of pulmonary artery without acute cor pulmonale: Secondary | ICD-10-CM | POA: Diagnosis not present

## 2021-11-14 DIAGNOSIS — F1910 Other psychoactive substance abuse, uncomplicated: Secondary | ICD-10-CM | POA: Diagnosis not present

## 2021-11-14 LAB — CBC
HCT: 35.9 % — ABNORMAL LOW (ref 36.0–46.0)
Hemoglobin: 10.2 g/dL — ABNORMAL LOW (ref 12.0–15.0)
MCH: 25.1 pg — ABNORMAL LOW (ref 26.0–34.0)
MCHC: 28.4 g/dL — ABNORMAL LOW (ref 30.0–36.0)
MCV: 88.4 fL (ref 80.0–100.0)
Platelets: 182 10*3/uL (ref 150–400)
RBC: 4.06 MIL/uL (ref 3.87–5.11)
RDW: 29.3 % — ABNORMAL HIGH (ref 11.5–15.5)
WBC: 10.1 10*3/uL (ref 4.0–10.5)
nRBC: 0 % (ref 0.0–0.2)

## 2021-11-14 NOTE — Progress Notes (Signed)
[facility]                                                                               Latoya [name], is a 36 y.o. female, DOB - 01/25/1986, ZJI:[id number] Admit date - 10/11/2021    Outpatient Primary MD for the patient is Pcp, No  LOS - 34  days    Brief summary   36 year old female past medical history of intravenous drug abuse (now reports abstinence),  MSSA tricuspid valve endocarditis in 2020 (Treated with angiovac and course of Cefazolin), MRSA L4-L5 facet join septic discitis in 07/2019, MRSA bacteremia with infective endocarditis and tricuspid valve vegetation diagnosed 07/24/21 who presented as a transfer from [facility] for saddle pulmonary embolism.  Blood cultures drawn on 10/11/2021 have grown Candida albicans, with negative repeat culture on 10/14/2021.  MRI L-spine done on 10/07/2021 with findings concerning for septic arthritis.  She is currently on micafungin and IV vancomycin, with infectious disease guidance, tentative end date 11/25/2021.   Cardiothoracic surgery was consulted, noting evidence of PFO at previous Angiovac attempt, stating only option would be for surgical valve repair and PFO closure. They recommended outpatient follow up after sustained abstinence of illicit drugs.      Assessment & Plan    Assessment and Plan: Acute saddle pulmonary embolism (HCC) -Failure to DOACs, developed while on eliquis. Echo does not show any RV strain.  -Patient feels uncomfortable with subcu injections.  Pharmacy was able to communicate with hematology, Dr. Myna Hidalgo who recommended Pradaxa Continue with Pradaxa.    Sepsis, multifactorial, POA with candidemia, endocarditis, discitis IVDU history, reports abstinence since March 2023 Counseled on cessation -Complete course of IV antibiotics 11/25/2021 as recommended by infectious disease.   Pneumonia left lower lobe HCAP versus septic emboli on prior admission Continue with IV antibiotics and Pradaxa.     Candidemia, tricuspid valve endocarditis, MRSA bacteremia, POA -Large vegetation. CT VS consulted, recommended follow-up after discharge for consideration of surgical valve repair and PFO closure after sustained abstinence of illicit drugs  -LUE PICC placed by IR 6/15, replaced 6/24  then on 7/7 due to kinking.  Blood cultures have been negative so far.  -ID onboard and recommend to continue with IV vancomycin and Micafungin.  Leukocytosis resolved.    Lumbar discitis Pain control and IV antibiotics.    Volume overload, pulmonary edema  Appears to have resolved.     Resolved hypokalemia, hypomagnesemia Repeat levels wnl.   Resolved mild hyponatremia Encourage adequate oral intake.   Chronic normocytic anemia Hemoglobin stable around 10.    Resolved thrombocytopenia   Chronic pain, pleuritic chest pain -Continue Neurontin 400 mg 3 times daily   Obesity Estimated body mass index is 32.31 kg/m as calculated from the following:   Height as of this encounter: 5\' 6"  (1.676 m).   Weight as of this encounter: 90.8 kg. Recommend weight loss outpatient with regular physical activity and healthy dieting.     Estimated body mass index is 32.02 kg/m as calculated from the following:   Height as of this encounter: 5\' 6"  (1.676 m).   Weight as of this encounter: 90 kg.  Code Status: full code.  DVT  Prophylaxis:   dabigatran (PRADAXA) capsule 150 mg   Level of Care: Level of care: Med-Surg Family Communication: none at bedside.   Disposition Plan:     Remains inpatient appropriate:  IV antibiotics.   Procedures:  None.   Consultants:   IR   Antimicrobials:   Anti-infectives (From admission, onward)    Start     Dose/Rate Route Frequency Ordered Stop   11/11/21 0630  vancomycin (VANCOCIN) IVPB 1000 mg/200 mL premix        1,000 mg 200 mL/hr over 60 Minutes Intravenous Every 12 hours 11/11/21 0328     11/07/21 0500  vancomycin (VANCOREADY) IVPB 1250 mg/250 mL  Status:   Discontinued        1,250 mg 166.7 mL/hr over 90 Minutes Intravenous Every 12 hours 11/06/21 1901 11/11/21 0328   11/04/21 1800  vancomycin (VANCOCIN) IVPB 1000 mg/200 mL premix  Status:  Discontinued        1,000 mg 200 mL/hr over 60 Minutes Intravenous Every 12 hours 11/04/21 0400 11/06/21 1901   11/04/21 0500  vancomycin (VANCOCIN) IVPB 1000 mg/200 mL premix        1,000 mg 200 mL/hr over 60 Minutes Intravenous  Once 11/04/21 0400 11/04/21 0906   11/03/21 1530  ceFEPIme (MAXIPIME) 2 g in sodium chloride 0.9 % 100 mL IVPB  Status:  Discontinued        2 g 200 mL/hr over 30 Minutes Intravenous Every 8 hours 11/03/21 1436 11/06/21 0908   11/03/21 1353  vancomycin variable dose per unstable renal function (pharmacist dosing)  Status:  Discontinued         Does not apply See admin instructions 11/03/21 1353 11/04/21 0400   10/25/21 1600  micafungin (MYCAMINE) 150 mg in sodium chloride 0.9 % 100 mL IVPB  Status:  Discontinued       See Hyperspace for full Linked Orders Report.   150 mg 107.5 mL/hr over 1 Hours Intravenous Every 24 hours 10/24/21 1301 10/24/21 1435   10/24/21 2200  vancomycin (VANCOREADY) IVPB 1250 mg/250 mL  Status:  Discontinued        1,250 mg 166.7 mL/hr over 90 Minutes Intravenous Every 12 hours 10/24/21 1435 11/03/21 1353   10/24/21 1600  fluconazole (DIFLUCAN) tablet 800 mg  Status:  Discontinued        800 mg Oral  Once 10/24/21 1300 10/24/21 1435   10/24/21 1600  micafungin (MYCAMINE) 150 mg in sodium chloride 0.9 % 100 mL IVPB       See Hyperspace for full Linked Orders Report.   150 mg 107.5 mL/hr over 1 Hours Intravenous Every 24 hours 10/24/21 1435     10/24/21 1100  linezolid (ZYVOX) tablet 600 mg  Status:  Discontinued        600 mg Oral Every 12 hours 10/24/21 0950 10/24/21 1509   10/19/21 2200  vancomycin (VANCOREADY) IVPB 1250 mg/250 mL  Status:  Discontinued        1,250 mg 166.7 mL/hr over 90 Minutes Intravenous Every 12 hours 10/19/21 1341 10/24/21  0950   10/18/21 2200  linezolid (ZYVOX) tablet 600 mg  Status:  Discontinued        600 mg Oral Every 12 hours 10/18/21 1600 10/19/21 1341   10/18/21 1700  fluconazole (DIFLUCAN) tablet 800 mg        800 mg Oral  Once 10/18/21 1600 10/18/21 1657   10/17/21 2200  vancomycin (VANCOREADY) IVPB 1250 mg/250 mL  Status:  Discontinued  1,250 mg 166.7 mL/hr over 90 Minutes Intravenous Every 12 hours 10/17/21 1144 10/18/21 1600   10/17/21 1600  micafungin (MYCAMINE) 150 mg in sodium chloride 0.9 % 100 mL IVPB  Status:  Discontinued       See Hyperspace for full Linked Orders Report.   150 mg 107.5 mL/hr over 1 Hours Intravenous Every 24 hours 10/14/21 1115 10/24/21 1301   10/14/21 1600  micafungin (MYCAMINE) 150 mg in sodium chloride 0.9 % 100 mL IVPB       See Hyperspace for full Linked Orders Report.   150 mg 107.5 mL/hr over 1 Hours Intravenous Every 24 hours 10/14/21 1115 10/16/21 1746   10/13/21 2200  vancomycin (VANCOREADY) IVPB 1500 mg/300 mL  Status:  Discontinued        1,500 mg 150 mL/hr over 120 Minutes Intravenous Every 12 hours 10/13/21 1321 10/17/21 1144   10/12/21 1700  micafungin (MYCAMINE) 150 mg in sodium chloride 0.9 % 100 mL IVPB  Status:  Discontinued        150 mg 115 mL/hr over 1 Hours Intravenous Every 24 hours 10/12/21 1601 10/14/21 1115   10/11/21 2200  vancomycin (VANCOREADY) IVPB 1250 mg/250 mL  Status:  Discontinued        1,250 mg 166.7 mL/hr over 90 Minutes Intravenous Every 12 hours 10/11/21 0903 10/13/21 1321   10/11/21 1000  linezolid (ZYVOX) tablet 600 mg  Status:  Discontinued        600 mg Oral 2 times daily 10/11/21 0149 10/11/21 0813   10/11/21 1000  ceFEPIme (MAXIPIME) 2 g in sodium chloride 0.9 % 100 mL IVPB  Status:  Discontinued        2 g 200 mL/hr over 30 Minutes Intravenous Every 8 hours 10/11/21 0901 10/16/21 0938   10/11/21 1000  vancomycin (VANCOREADY) IVPB 1750 mg/350 mL        1,750 mg 175 mL/hr over 120 Minutes Intravenous  Once  10/11/21 0901 10/11/21 1242        Medications  Scheduled Meds:  Chlorhexidine Gluconate Cloth  6 each Topical Daily   dabigatran  150 mg Oral Q12H   feeding supplement  237 mL Oral BID BM   ferrous gluconate  324 mg Oral Q breakfast   gabapentin  400 mg Oral TID   melatonin  3 mg Oral QHS   oxyCODONE  10 mg Oral Q12H   pantoprazole  40 mg Oral Daily   polyethylene glycol  17 g Oral Daily   potassium chloride  40 mEq Oral Daily   senna-docusate  1 tablet Oral QHS   sodium chloride flush  10-40 mL Intracatheter Q12H   Continuous Infusions:  sodium chloride 10 mL/hr at 11/12/21 0611   micafungin (MYCAMINE) 150 mg in sodium chloride 0.9 % 100 mL IVPB Stopped (11/13/21 1656)   vancomycin 1,000 mg (11/14/21 1028)   PRN Meds:.sodium chloride, acetaminophen **OR** acetaminophen, benzonatate, chlorpheniramine-HYDROcodone, docusate sodium, guaiFENesin, menthol-cetylpyridinium, ondansetron **OR** ondansetron (ZOFRAN) IV, oxyCODONE-acetaminophen **OR** [DISCONTINUED]  morphine injection, polyethylene glycol, prochlorperazine, sodium chloride flush    Subjective:   Latoya [name] was seen and examined today. No new complaints.   Objective:   Vitals:   11/13/21 0403 11/13/21 1521 11/13/21 2034 11/14/21 0336  BP: (!) 121/93 (!) 135/107 (!) 134/97 (!) 146/105  Pulse: 96 (!) 106 100 (!) 104  Resp: 18 18 18 20   Temp: 98.5 F (36.9 C) 98.5 F (36.9 C) 98.4 F (36.9 C) 98.4 F (36.9 C)  TempSrc: Oral Oral Oral Oral  SpO2: 96% 96% 96% 96%  Weight: 90.6 kg   90 kg  Height:       No intake or output data in the 24 hours ending 11/14/21 1232 Filed Weights   11/12/21 0438 11/13/21 0403 11/14/21 0336  Weight: 90.8 kg 90.6 kg 90 kg     Exam General exam: Appears calm and comfortable  Respiratory system: Clear to auscultation. Respiratory effort normal. Cardiovascular system: S1 & S2 heard, RRR. No pedal edema. Gastrointestinal system: Abdomen is nondistended, soft and nontender.   Central nervous system: Alert and oriented. No focal neurological deficits. Extremities: Symmetric 5 x 5 power. Skin: No rashes, lesions or ulcers Psychiatry: mood is appropriate.     Data Reviewed:  I have personally reviewed following labs and imaging studies   CBC Lab Results  Component Value Date   WBC 10.1 11/14/2021   RBC 4.06 11/14/2021   HGB 10.2 (L) 11/14/2021   HCT 35.9 (L) 11/14/2021   MCV 88.4 11/14/2021   MCH 25.1 (L) 11/14/2021   PLT 182 11/14/2021   MCHC 28.4 (L) 11/14/2021   RDW 29.3 (H) 11/14/2021   LYMPHSABS 3.7 10/29/2021   MONOABS 1.3 (H) 10/29/2021   EOSABS 0.9 (H) 10/29/2021   BASOSABS 0.0 10/29/2021     Last metabolic panel Lab Results  Component Value Date   NA 137 11/13/2021   K 4.3 11/13/2021   CL 101 11/13/2021   CO2 27 11/13/2021   BUN 6 11/13/2021   CREATININE 0.68 11/13/2021   GLUCOSE 148 (H) 11/13/2021   GFRNONAA >60 11/13/2021   GFRAA >60 08/07/2019   CALCIUM 8.4 (L) 11/13/2021   PHOS 3.6 10/25/2021   PROT 7.5 10/25/2021   ALBUMIN 2.1 (L) 10/25/2021   BILITOT 0.2 (L) 10/25/2021   ALKPHOS 57 10/25/2021   AST 13 (L) 10/25/2021   ALT 7 10/25/2021   ANIONGAP 9 11/13/2021    CBG (last 3)  No results for input(s): "GLUCAP" in the last 72 hours.    Coagulation Profile: No results for input(s): "INR", "PROTIME" in the last 168 hours.   Radiology Studies: No results found.     Kathlen Mody M.D. [facility] 11/14/2021, 12:32 PM  Available via Epic secure chat 7am-7pm After 7 pm, please refer to night coverage provider listed on amion.

## 2021-11-15 DIAGNOSIS — M4646 Discitis, unspecified, lumbar region: Secondary | ICD-10-CM | POA: Diagnosis not present

## 2021-11-15 DIAGNOSIS — F1910 Other psychoactive substance abuse, uncomplicated: Secondary | ICD-10-CM | POA: Diagnosis not present

## 2021-11-15 DIAGNOSIS — I2692 Saddle embolus of pulmonary artery without acute cor pulmonale: Secondary | ICD-10-CM | POA: Diagnosis not present

## 2021-11-15 DIAGNOSIS — G8929 Other chronic pain: Secondary | ICD-10-CM | POA: Diagnosis not present

## 2021-11-15 LAB — BASIC METABOLIC PANEL
Anion gap: 8 (ref 5–15)
BUN: 5 mg/dL — ABNORMAL LOW (ref 6–20)
CO2: 25 mmol/L (ref 22–32)
Calcium: 8.5 mg/dL — ABNORMAL LOW (ref 8.9–10.3)
Chloride: 102 mmol/L (ref 98–111)
Creatinine, Ser: 0.69 mg/dL (ref 0.44–1.00)
GFR, Estimated: >60 mL/min (ref >60)
Glucose, Bld: 100 mg/dL — ABNORMAL HIGH (ref 70–99)
Potassium: 4.3 mmol/L (ref 3.5–5.1)
Sodium: 135 mmol/L (ref 135–145)

## 2021-11-15 LAB — VANCOMYCIN, PEAK: Vancomycin Pk: 36 ug/mL (ref 30–40)

## 2021-11-15 MED ORDER — BOOST / RESOURCE BREEZE PO LIQD CUSTOM
1.0000 | Freq: Two times a day (BID) | ORAL | Status: DC
  Administered 2021-11-15 – 2021-11-26 (×22): 1 via ORAL

## 2021-11-15 MED ORDER — HYDRALAZINE HCL 25 MG PO TABS
25.0000 mg | ORAL_TABLET | Freq: Three times a day (TID) | ORAL | Status: DC | PRN
  Administered 2021-11-16: 25 mg via ORAL
  Filled 2021-11-15: qty 1

## 2021-11-15 NOTE — Progress Notes (Signed)
[facility]                                                                               Latoya [name], is a 36 y.o. female, DOB - 12/15/1985, QXI:[id number] Admit date - 10/11/2021    Outpatient Primary MD for the patient is Pcp, No  LOS - 35  days    Brief summary   36 year old female past medical history of intravenous drug abuse (now reports abstinence),  MSSA tricuspid valve endocarditis in 2020 (Treated with angiovac and course of Cefazolin), MRSA L4-L5 facet join septic discitis in 07/2019, MRSA bacteremia with infective endocarditis and tricuspid valve vegetation diagnosed 07/24/21 who presented as a transfer from [facility] for saddle pulmonary embolism.  Blood cultures drawn on 10/11/2021 have grown Candida albicans, with negative repeat culture on 10/14/2021.  MRI L-spine done on 10/07/2021 with findings concerning for septic arthritis.  She is currently on micafungin and IV vancomycin, with infectious disease guidance, tentative end date 11/25/2021.   Cardiothoracic surgery was consulted, noting evidence of PFO at previous Angiovac attempt, stating only option would be for surgical valve repair and PFO closure. They recommended outpatient follow up after sustained abstinence of illicit drugs.  Plan to continue with IV vancomycin and Micafungin till 11/25/2021.      Assessment & Plan    Assessment and Plan: Acute saddle pulmonary embolism (HCC) -Failure to DOACs, developed while on eliquis. Echo does not show any RV strain.  -Patient feels uncomfortable with subcu injections.  Pharmacy was able to communicate with hematology, Dr. Myna Hidalgo who recommended Pradaxa Continue with Pradaxa.  She denies sob, currently on 2 lit of [place] oxygen.  Plan to wean her off the oxygen.    Sepsis, multifactorial, POA with candidemia, endocarditis, discitis IVDU history, reports abstinence since March 2023 Counseled on cessation -Complete course of IV antibiotics 11/25/2021 as  recommended by infectious disease.   Pneumonia left lower lobe HCAP versus septic emboli on prior admission Continue with IV antibiotics and Pradaxa.    Candidemia, tricuspid valve endocarditis, MRSA bacteremia, POA -Large vegetation. CT VS consulted, recommended follow-up after discharge for consideration of surgical valve repair and PFO closure after sustained abstinence of illicit drugs  -LUE PICC placed by IR 6/15, replaced 6/24  then on 7/7 due to kinking.  Blood cultures have been negative so far.  -ID onboard and recommend to continue with IV vancomycin and Micafungin.  Leukocytosis resolved.    Lumbar discitis Pain control and IV antibiotics.    Volume overload, pulmonary edema  Appears to have resolved.     Resolved hypokalemia, hypomagnesemia Repeat levels wnl.   Resolved mild hyponatremia Encourage adequate oral intake.   Chronic normocytic anemia Hemoglobin stable around 10.    Resolved thrombocytopenia   Chronic pain, pleuritic chest pain -Continue Neurontin 400 mg 3 times daily   Obesity Estimated body mass index is 32.31 kg/m as calculated from the following:   Height as of this encounter: 5\' 6"  (1.676 m).   Weight as of this encounter: 90.8 kg. Recommend weight loss outpatient with regular physical activity and healthy dieting.  Anemia of chronic disease:  Hemoglobin stable around 10.   Hypertension:  BP parameters are sub optimal.  Will add prn hydralazine.    Encourage oob and therapy evaluations.    Estimated body mass index is 31.85 kg/m as calculated from the following:   Height as of this encounter: 5\' 6"  (1.676 m).   Weight as of this encounter: 89.5 kg.  Code Status: full code.  DVT Prophylaxis:   dabigatran (PRADAXA) capsule 150 mg   Level of Care: Level of care: Med-Surg Family Communication: none at bedside.   Disposition Plan:     Remains inpatient appropriate:  IV antibiotics.   Procedures:  None.   Consultants:   IR    Antimicrobials:   Anti-infectives (From admission, onward)    Start     Dose/Rate Route Frequency Ordered Stop   11/11/21 0630  vancomycin (VANCOCIN) IVPB 1000 mg/200 mL premix        1,000 mg 200 mL/hr over 60 Minutes Intravenous Every 12 hours 11/11/21 0328     11/07/21 0500  vancomycin (VANCOREADY) IVPB 1250 mg/250 mL  Status:  Discontinued        1,250 mg 166.7 mL/hr over 90 Minutes Intravenous Every 12 hours 11/06/21 1901 11/11/21 0328   11/04/21 1800  vancomycin (VANCOCIN) IVPB 1000 mg/200 mL premix  Status:  Discontinued        1,000 mg 200 mL/hr over 60 Minutes Intravenous Every 12 hours 11/04/21 0400 11/06/21 1901   11/04/21 0500  vancomycin (VANCOCIN) IVPB 1000 mg/200 mL premix        1,000 mg 200 mL/hr over 60 Minutes Intravenous  Once 11/04/21 0400 11/04/21 0906   11/03/21 1530  ceFEPIme (MAXIPIME) 2 g in sodium chloride 0.9 % 100 mL IVPB  Status:  Discontinued        2 g 200 mL/hr over 30 Minutes Intravenous Every 8 hours 11/03/21 1436 11/06/21 0908   11/03/21 1353  vancomycin variable dose per unstable renal function (pharmacist dosing)  Status:  Discontinued         Does not apply See admin instructions 11/03/21 1353 11/04/21 0400   10/25/21 1600  micafungin (MYCAMINE) 150 mg in sodium chloride 0.9 % 100 mL IVPB  Status:  Discontinued       See Hyperspace for full Linked Orders Report.   150 mg 107.5 mL/hr over 1 Hours Intravenous Every 24 hours 10/24/21 1301 10/24/21 1435   10/24/21 2200  vancomycin (VANCOREADY) IVPB 1250 mg/250 mL  Status:  Discontinued        1,250 mg 166.7 mL/hr over 90 Minutes Intravenous Every 12 hours 10/24/21 1435 11/03/21 1353   10/24/21 1600  fluconazole (DIFLUCAN) tablet 800 mg  Status:  Discontinued        800 mg Oral  Once 10/24/21 1300 10/24/21 1435   10/24/21 1600  micafungin (MYCAMINE) 150 mg in sodium chloride 0.9 % 100 mL IVPB       See Hyperspace for full Linked Orders Report.   150 mg 107.5 mL/hr over 1 Hours Intravenous Every  24 hours 10/24/21 1435     10/24/21 1100  linezolid (ZYVOX) tablet 600 mg  Status:  Discontinued        600 mg Oral Every 12 hours 10/24/21 0950 10/24/21 1509   10/19/21 2200  vancomycin (VANCOREADY) IVPB 1250 mg/250 mL  Status:  Discontinued        1,250 mg 166.7 mL/hr over 90 Minutes Intravenous Every 12 hours 10/19/21 1341 10/24/21 0950   10/18/21 2200  linezolid (ZYVOX) tablet 600 mg  Status:  Discontinued  600 mg Oral Every 12 hours 10/18/21 1600 10/19/21 1341   10/18/21 1700  fluconazole (DIFLUCAN) tablet 800 mg        800 mg Oral  Once 10/18/21 1600 10/18/21 1657   10/17/21 2200  vancomycin (VANCOREADY) IVPB 1250 mg/250 mL  Status:  Discontinued        1,250 mg 166.7 mL/hr over 90 Minutes Intravenous Every 12 hours 10/17/21 1144 10/18/21 1600   10/17/21 1600  micafungin (MYCAMINE) 150 mg in sodium chloride 0.9 % 100 mL IVPB  Status:  Discontinued       See Hyperspace for full Linked Orders Report.   150 mg 107.5 mL/hr over 1 Hours Intravenous Every 24 hours 10/14/21 1115 10/24/21 1301   10/14/21 1600  micafungin (MYCAMINE) 150 mg in sodium chloride 0.9 % 100 mL IVPB       See Hyperspace for full Linked Orders Report.   150 mg 107.5 mL/hr over 1 Hours Intravenous Every 24 hours 10/14/21 1115 10/16/21 1746   10/13/21 2200  vancomycin (VANCOREADY) IVPB 1500 mg/300 mL  Status:  Discontinued        1,500 mg 150 mL/hr over 120 Minutes Intravenous Every 12 hours 10/13/21 1321 10/17/21 1144   10/12/21 1700  micafungin (MYCAMINE) 150 mg in sodium chloride 0.9 % 100 mL IVPB  Status:  Discontinued        150 mg 115 mL/hr over 1 Hours Intravenous Every 24 hours 10/12/21 1601 10/14/21 1115   10/11/21 2200  vancomycin (VANCOREADY) IVPB 1250 mg/250 mL  Status:  Discontinued        1,250 mg 166.7 mL/hr over 90 Minutes Intravenous Every 12 hours 10/11/21 0903 10/13/21 1321   10/11/21 1000  linezolid (ZYVOX) tablet 600 mg  Status:  Discontinued        600 mg Oral 2 times daily 10/11/21 0149  10/11/21 0813   10/11/21 1000  ceFEPIme (MAXIPIME) 2 g in sodium chloride 0.9 % 100 mL IVPB  Status:  Discontinued        2 g 200 mL/hr over 30 Minutes Intravenous Every 8 hours 10/11/21 0901 10/16/21 0938   10/11/21 1000  vancomycin (VANCOREADY) IVPB 1750 mg/350 mL        1,750 mg 175 mL/hr over 120 Minutes Intravenous  Once 10/11/21 0901 10/11/21 1242        Medications  Scheduled Meds:  Chlorhexidine Gluconate Cloth  6 each Topical Daily   dabigatran  150 mg Oral Q12H   feeding supplement  1 Container Oral BID BM   ferrous gluconate  324 mg Oral Q breakfast   gabapentin  400 mg Oral TID   melatonin  3 mg Oral QHS   oxyCODONE  10 mg Oral Q12H   pantoprazole  40 mg Oral Daily   polyethylene glycol  17 g Oral Daily   potassium chloride  40 mEq Oral Daily   senna-docusate  1 tablet Oral QHS   sodium chloride flush  10-40 mL Intracatheter Q12H   Continuous Infusions:  sodium chloride 10 mL/hr at 11/15/21 1002   micafungin (MYCAMINE) 150 mg in sodium chloride 0.9 % 100 mL IVPB 150 mg (11/14/21 1553)   vancomycin 1,000 mg (11/15/21 1003)   PRN Meds:.sodium chloride, acetaminophen **OR** acetaminophen, benzonatate, chlorpheniramine-HYDROcodone, docusate sodium, guaiFENesin, menthol-cetylpyridinium, ondansetron **OR** ondansetron (ZOFRAN) IV, oxyCODONE-acetaminophen **OR** [DISCONTINUED]  morphine injection, polyethylene glycol, prochlorperazine, sodium chloride flush    Subjective:   Preslei Glatt was seen and examined today. No new complaints. She denies chest pain or  sob.  Reports feeling sleepy.   Objective:   Vitals:   11/14/21 1528 11/15/21 0600 11/15/21 0749 11/15/21 1233  BP: (!) 134/103 (!) 167/110 (!) 126/95 (!) 144/120  Pulse: (!) 107 (!) 109 (!) 104 95  Resp: 18 20 (!) 21   Temp: 97.8 F (36.6 C) 100 F (37.8 C) 98 F (36.7 C) 97.6 F (36.4 C)  TempSrc: Oral Oral Oral Oral  SpO2: 96% 94% 94% 98%  Weight:  89.5 kg    Height:        Intake/Output  Summary (Last 24 hours) at 11/15/2021 1500 Last data filed at 11/15/2021 0600 Gross per 24 hour  Intake 660 ml  Output --  Net 660 ml   Filed Weights   11/13/21 0403 11/14/21 0336 11/15/21 0600  Weight: 90.6 kg 90 kg 89.5 kg     Exam General exam: Appears calm and comfortable  Respiratory system: Diminished at bases, on 2 lit of [place] oxygen.  Cardiovascular system: S1 & S2 heard, RRR. No JVD,  No pedal edema. Gastrointestinal system: Abdomen is nondistended, soft and nontender. Normal bowel sounds heard. Central nervous system: Alert and oriented to place and person. Grossly  non focal. Able to move all extremities.  Extremities: No pedal edema.  Skin: No rashes seen.  Psychiatry: flat affect.      Data Reviewed:  I have personally reviewed following labs and imaging studies   CBC Lab Results  Component Value Date   WBC 10.1 11/14/2021   RBC 4.06 11/14/2021   HGB 10.2 (L) 11/14/2021   HCT 35.9 (L) 11/14/2021   MCV 88.4 11/14/2021   MCH 25.1 (L) 11/14/2021   PLT 182 11/14/2021   MCHC 28.4 (L) 11/14/2021   RDW 29.3 (H) 11/14/2021   LYMPHSABS 3.7 10/29/2021   MONOABS 1.3 (H) 10/29/2021   EOSABS 0.9 (H) 10/29/2021   BASOSABS 0.0 10/29/2021     Last metabolic panel Lab Results  Component Value Date   NA 135 11/15/2021   K 4.3 11/15/2021   CL 102 11/15/2021   CO2 25 11/15/2021   BUN 5 (L) 11/15/2021   CREATININE 0.69 11/15/2021   GLUCOSE 100 (H) 11/15/2021   GFRNONAA >60 11/15/2021   GFRAA >60 08/07/2019   CALCIUM 8.5 (L) 11/15/2021   PHOS 3.6 10/25/2021   PROT 7.5 10/25/2021   ALBUMIN 2.1 (L) 10/25/2021   BILITOT 0.2 (L) 10/25/2021   ALKPHOS 57 10/25/2021   AST 13 (L) 10/25/2021   ALT 7 10/25/2021   ANIONGAP 8 11/15/2021    CBG (last 3)  No results for input(s): "GLUCAP" in the last 72 hours.    Coagulation Profile: No results for input(s): "INR", "PROTIME" in the last 168 hours.   Radiology Studies: No results found.     Kathlen Mody  M.D. [facility] 11/15/2021, 3:00 PM  Available via Epic secure chat 7am-7pm After 7 pm, please refer to night coverage provider listed on amion.

## 2021-11-15 NOTE — Progress Notes (Signed)
PT Cancellation Note  Patient Details Name: Latoya [name] MRN: [id number] DOB: [birth year]   Cancelled Treatment:    Reason Eval/Treat Not Completed: Patient declined, no reason specified. PT attempted to see pt twice for evaluation today, declining both times. Pt reports she "does not feel like moving". When asked why se reports she does not feel well, responding yet to nausea and pain, but she also declines any possible assistance from staff in regards to these. PT will attempt to follow up as time allows.   Arlyss Gandy 11/15/2021, 1:15 PM

## 2021-11-16 ENCOUNTER — Other Ambulatory Visit (HOSPITAL_BASED_OUTPATIENT_CLINIC_OR_DEPARTMENT_OTHER): Payer: Self-pay

## 2021-11-16 DIAGNOSIS — R7881 Bacteremia: Secondary | ICD-10-CM | POA: Diagnosis not present

## 2021-11-16 DIAGNOSIS — J189 Pneumonia, unspecified organism: Secondary | ICD-10-CM | POA: Diagnosis not present

## 2021-11-16 DIAGNOSIS — B9562 Methicillin resistant Staphylococcus aureus infection as the cause of diseases classified elsewhere: Secondary | ICD-10-CM | POA: Diagnosis not present

## 2021-11-16 DIAGNOSIS — F1910 Other psychoactive substance abuse, uncomplicated: Secondary | ICD-10-CM | POA: Diagnosis not present

## 2021-11-16 DIAGNOSIS — I079 Rheumatic tricuspid valve disease, unspecified: Secondary | ICD-10-CM | POA: Diagnosis not present

## 2021-11-16 DIAGNOSIS — I2692 Saddle embolus of pulmonary artery without acute cor pulmonale: Secondary | ICD-10-CM | POA: Diagnosis not present

## 2021-11-16 DIAGNOSIS — M4646 Discitis, unspecified, lumbar region: Secondary | ICD-10-CM | POA: Diagnosis not present

## 2021-11-16 DIAGNOSIS — G8929 Other chronic pain: Secondary | ICD-10-CM | POA: Diagnosis not present

## 2021-11-16 DIAGNOSIS — B377 Candidal sepsis: Secondary | ICD-10-CM | POA: Diagnosis not present

## 2021-11-16 LAB — BASIC METABOLIC PANEL
Anion gap: 7 (ref 5–15)
BUN: 6 mg/dL (ref 6–20)
CO2: 26 mmol/L (ref 22–32)
Calcium: 8.9 mg/dL (ref 8.9–10.3)
Chloride: 105 mmol/L (ref 98–111)
Creatinine, Ser: 0.69 mg/dL (ref 0.44–1.00)
GFR, Estimated: >60 mL/min (ref >60)
Glucose, Bld: 86 mg/dL (ref 70–99)
Potassium: 4.3 mmol/L (ref 3.5–5.1)
Sodium: 138 mmol/L (ref 135–145)

## 2021-11-16 LAB — VANCOMYCIN, PEAK: Vancomycin Pk: 31 ug/mL (ref 30–40)

## 2021-11-16 LAB — VANCOMYCIN, TROUGH: Vancomycin Tr: 12 ug/mL — ABNORMAL LOW (ref 15–20)

## 2021-11-16 NOTE — Plan of Care (Signed)

## 2021-11-16 NOTE — Progress Notes (Addendum)
[facility]  Date of Admission:  10/11/2021           Reason for visit: Follow up on tricuspid valve endocarditis, lumbar discitis   Current antibiotics: Vancomycin Micafungin   ASSESSMENT:    36 y.o. female admitted with:  Tricuspid valve endocarditis: Blood cultures have grown Candida albicans from 10/11/2021 this admission with negative repeat cultures on 6/10.  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  She is admitted now as above with a larger tricuspid valve vegetation and new septic arthritis at L4-5.  She is currently on micafungin and vancomycin.  She is not an angio VAC candidate due to a PFO and only option in the future would be valve repair with PFO closure after abstinence from injection drug use shown as an outpatient. Discitis/osteomyelitis: She is on antimicrobials as above. Septic pulmonary emboli: Currently on room air Saddle pulmonary emboli: Also currently on Pradaxa. History of injection drug use: Patient reports that she has been abstinent from opiates since March.   RECOMMENDATIONS:    Continue vancomycin Continue micafungin End date of IV antibiotics = 11/25/2021 At discharge, would continue with oral fluconazole due to concern for Candida endocarditis.  Duration of therapy to be determined, however, would anticipate 6 to 12 months. She would like to speak with Dr. Cliffton Asters from CT surgery again regarding timing of possible valve repair Anticipate this will need to be done as an outpatient following several months of confirmed opiate abstinence, however, will leave this up to CT surgery Will arrange for outpatient follow-up with Dr. Renold Don on 12/01/21 at 3:45pm Please call as needed   Principal Problem:   Acute saddle pulmonary embolism (HCC) Active Problems:   Endocarditis of tricuspid valve   IV drug  abuse (HCC)   Lumbar discitis   Hyponatremia   MRSA bacteremia   Chronic pain   Thrombocytopenia (HCC)   Pneumonia of left lower lobe due to infectious organism   Candidemia (HCC)   Pleuritic chest pain    MEDICATIONS:    Scheduled Meds:  Chlorhexidine Gluconate Cloth  6 each Topical Daily   dabigatran  150 mg Oral Q12H   feeding supplement  1 Container Oral BID BM   ferrous gluconate  324 mg Oral Q breakfast   gabapentin  400 mg Oral TID   melatonin  3 mg Oral QHS   oxyCODONE  10 mg Oral Q12H   pantoprazole  40 mg Oral Daily   polyethylene glycol  17 g Oral Daily   potassium chloride  40 mEq Oral Daily   senna-docusate  1 tablet Oral QHS   sodium chloride flush  10-40 mL Intracatheter Q12H   Continuous Infusions:  sodium chloride 10 mL/hr at 11/15/21 1002   micafungin (MYCAMINE) 150 mg in sodium chloride 0.9 % 100 mL IVPB 150 mg (11/15/21 1700)   vancomycin Stopped (11/15/21 2300)   PRN Meds:.sodium chloride, acetaminophen **OR** acetaminophen, benzonatate, chlorpheniramine-HYDROcodone, docusate sodium, guaiFENesin, hydrALAZINE, menthol-cetylpyridinium, ondansetron **OR** ondansetron (ZOFRAN) IV, oxyCODONE-acetaminophen **OR** [DISCONTINUED]  morphine injection, polyethylene glycol, prochlorperazine, sodium chloride flush  SUBJECTIVE:   24 hour events:  No acute events noted She remains on antibiotics with vancomycin and micafungin Stable BMP this morning No new imaging No new cultures   No new complaints.  Wants to have surgery on her valve while admitted.   ROS  OBJECTIVE:   Blood pressure 108/88, pulse (!) 102, temperature 98.4 F (36.9 C), temperature source Oral, resp. rate 18, height 5\' 6"  (1.676 m), weight 88.5 kg, SpO2 94 %. Body mass index is 31.47 kg/m.  Physical Exam Constitutional:      General: She is not in acute distress.    Appearance: Normal appearance.  HENT:     Head: Normocephalic and atraumatic.  Eyes:     Extraocular Movements:  Extraocular movements intact.     Conjunctiva/sclera: Conjunctivae normal.  Abdominal:     General: There is no distension.     Palpations: Abdomen is soft.  Musculoskeletal:        General: Normal range of motion.     Cervical back: Normal range of motion and neck supple.  Skin:    General: Skin is warm and dry.  Neurological:     General: No focal deficit present.     Mental Status: She is alert and oriented to person, place, and time.  Psychiatric:        Mood and Affect: Mood normal.        Behavior: Behavior normal.      Lab Results: Lab Results  Component Value Date   WBC 10.1 11/14/2021   HGB 10.2 (L) 11/14/2021   HCT 35.9 (L) 11/14/2021   MCV 88.4 11/14/2021   PLT 182 11/14/2021    Lab Results  Component Value Date   NA 138 11/16/2021   K 4.3 11/16/2021   CO2 26 11/16/2021   GLUCOSE 86 11/16/2021   BUN 6 11/16/2021   CREATININE 0.69 11/16/2021   CALCIUM 8.9 11/16/2021   GFRNONAA >60 11/16/2021   GFRAA >60 08/07/2019    Lab Results  Component Value Date   ALT 7 10/25/2021   AST 13 (L) 10/25/2021   ALKPHOS 57 10/25/2021   BILITOT 0.2 (L) 10/25/2021       Component Value Date/Time   CRP 13.3 (H) 10/11/2021 0257       Component Value Date/Time   ESRSEDRATE 80 (H) 09/13/2021 0155     I have reviewed the micro and lab results in Epic.  Imaging: No results found.   Imaging independently reviewed in Epic.    11/13/2021 for Infectious Disease [facility] [phone] pager 11/16/2021, 8:07 AM

## 2021-11-16 NOTE — Progress Notes (Signed)
Pharmacy Antibiotic Note Follow up  Latoya [name] is a 36 y.o. female admitted on 10/11/2021 with acute BL PE and TV endocarditis. Patient with history of MSSA and MRSA bacteremia c/b endocarditis and lumbar discitis. Pt recently completed 6 weeks of vancomycin and was started on linezolid after. Pharmacy consulted on 11/03/21 for vancomycin. Micafungin ordered per ID as well with  C. Albicans growing in blood. End date of IV antibiotics 11/25/21 per ID.    Vancomycin trough: 12 @ 0930 7/13 Vancomycin peak: 31 @ 0017 7/13  Scr 0.69: stable at baseline Calculated AUC ~550 with current regimen within goal range 400-600  Current regimen: Vancomycin 1000 mg IV q12 hours    Plan: Vancomycin 1000mg  IV q12 hours (eAUC 550)  Obtain peak and trough levels to measure AUC early next week or sooner if change in renal function or clinical status    Height: 5\' 6"  (167.6 cm) Weight: 88.5 kg (195 lb) IBW/kg (Calculated) : 59.3  Temp (24hrs), Avg:98.2 F (36.8 C), Min:97.6 F (36.4 C), Max:98.5 F (36.9 C)  Recent Labs  Lab 11/10/21 0458 11/10/21 1600 11/10/21 2146 11/11/21 0558 11/12/21 0500 11/13/21 0449 11/14/21 0559 11/15/21 0500 11/15/21 1155 11/16/21 0017 11/16/21 0930  WBC  --   --   --  13.0*  --   --  10.1  --   --   --   --   CREATININE 0.59  --   --   --  0.55 0.68  --  0.69  --  0.69  --   VANCOTROUGH  --  11*  --   --   --   --   --   --   --   --  12*  VANCOPEAK  --   --    < >  --   --   --   --   --  36 31  --    < > = values in this interval not displayed.     Estimated Creatinine Clearance: 111.1 mL/min (by C-G formula based on SCr of 0.69 mg/dL).    Allergies  Allergen Reactions   Naltrexone Anxiety    Severe anxiety Restlessness  Vomiting     Antimicrobials this admission: Vancomycin 6/7 >>  Cefepime 6/7 >> 6/12; 6/30 >> Micafungin 6/8 >>  Vancomycin levels 6/23 Vanc levels- AUC 510  6/30 VP > 60 - possibly contaminated by drug - decr'd to 1g q12 for  safety          VR 18/ VT 11- AUC 409  7/3 VP 28/VT12 AUC 369> incr 1250 q12  7/7 VT =11 on vanc 1250 mg q12h,  continue at same dose.  7/8 extrapolated AUC 620, will reduce to 1000mg  Q12 hours 7/13 AUC 559. Continue at same dose.     Microbiology results: 6/7 BCx: C. Albicans 6/7 MRSA PCR: negative 6/9 resp- normal flora 6/10 blood x2- neg  Thank you for allowing pharmacy to be a part of this patient's care.   8/7, PharmD PGY1 Pharmacy Resident   11/16/2021 11:38 AM

## 2021-11-16 NOTE — Progress Notes (Signed)
PT Cancellation Note  Patient Details Name: Latoya [name] MRN: [id number] DOB: 1986/02/23   Cancelled Treatment:    Reason Eval/Treat Not Completed: Patient declined, no reason specified. Pt continues to refuse PT. If refuses next attempt will dc from PT.   Angelina Ok [facility] 11/16/2021, 12:14 PM Skip Mayer PT Acute [employer] [phone]

## 2021-11-16 NOTE — Progress Notes (Signed)
[facility]                                                                               Latoya [name], is a 36 y.o. female, DOB - [birth year], TKZ:[id number] Admit date - 10/11/2021    Outpatient Primary MD for the patient is Pcp, No  LOS - 36  days    Brief summary   36 year old female past medical history of intravenous drug abuse (now reports abstinence),  MSSA tricuspid valve endocarditis in 2020 (Treated with angiovac and course of Cefazolin), MRSA L4-L5 facet join septic discitis in 07/2019, MRSA bacteremia with infective endocarditis and tricuspid valve vegetation diagnosed 07/24/21 who presented as a transfer from [facility] for saddle pulmonary embolism.  Blood cultures drawn on 10/11/2021 have grown Candida albicans, with negative repeat culture on 10/14/2021.  MRI L-spine done on 10/07/2021 with findings concerning for septic arthritis.  She is currently on micafungin and IV vancomycin, with infectious disease guidance, tentative end date 11/25/2021.   Cardiothoracic surgery was consulted, noting evidence of PFO at previous Angiovac attempt, stating only option would be for surgical valve repair and PFO closure. They recommended outpatient follow up after sustained abstinence of illicit drugs.  Plan to continue with IV vancomycin and Micafungin till 11/25/2021. ID recommends oral fluconazole due to concern for candida endocarditis for possible 6 to 12 months.  Will Request Dr Cliffton Asters to talk to the patient regarding the timing of the valve repair.      Assessment & Plan    Assessment and Plan: Acute saddle pulmonary embolism (HCC) -Failure to DOACs, developed while on eliquis. Echo does not show any RV strain.  - she was started on Pradaxa after speaking with Dr Myna Hidalgo.  - she is currently sob and coughing occasionally.    Sepsis, multifactorial, POA with candidemia, endocarditis, discitis IVDU history, reports abstinence since March 2023 Counseled on  cessation -Complete course of IV antibiotics 11/25/2021 as recommended by infectious disease.   Pneumonia left lower lobe HCAP versus septic emboli on prior admission Continue with IV antibiotics and Pradaxa.    Candidemia, tricuspid valve endocarditis, MRSA bacteremia, POA -Large vegetation. CT VS consulted, recommended follow-up after discharge for consideration of surgical valve repair and PFO closure after sustained abstinence of illicit drugs  -LUE PICC placed by IR 6/15, replaced 6/24  then on 7/7 due to kinking.  Blood cultures have been negative so far.  -ID onboard and recommend to continue with IV vancomycin and Micafungin.  Leukocytosis resolved.    Lumbar discitis Pain control and IV antibiotics.    Volume overload, pulmonary edema  Appears to have resolved.     Resolved hypokalemia, hypomagnesemia Repeat levels wnl.   Resolved mild hyponatremia Encourage adequate oral intake.   Chronic normocytic anemia Hemoglobin stable around 10.    Resolved thrombocytopenia   Chronic pain, pleuritic chest pain -Continue Neurontin 400 mg 3 times daily   Obesity Estimated body mass index is 32.31 kg/m as calculated from the following:   Height as of this encounter: 5\' 6"  (1.676 m).   Weight as of this encounter: 90.8 kg. Recommend weight loss outpatient with regular physical activity and healthy dieting.  Anemia of chronic disease:  Hemoglobin stable around 10.   Hypertension:  BP parameters are sub optimal.  Will add prn hydralazine.   GERD:  On PPI.    Encourage oob and therapy evaluations.    Estimated body mass index is 31.47 kg/m as calculated from the following:   Height as of this encounter: 5\' 6"  (1.676 m).   Weight as of this encounter: 88.5 kg.  Code Status: full code.  DVT Prophylaxis:   dabigatran (PRADAXA) capsule 150 mg   Level of Care: Level of care: Med-Surg Family Communication: none at bedside.   Disposition Plan:     Remains inpatient  appropriate:  IV antibiotics.   Procedures:  None.   Consultants:   IR  ID  Antimicrobials:   Anti-infectives (From admission, onward)    Start     Dose/Rate Route Frequency Ordered Stop   11/11/21 0630  vancomycin (VANCOCIN) IVPB 1000 mg/200 mL premix        1,000 mg 200 mL/hr over 60 Minutes Intravenous Every 12 hours 11/11/21 0328     11/07/21 0500  vancomycin (VANCOREADY) IVPB 1250 mg/250 mL  Status:  Discontinued        1,250 mg 166.7 mL/hr over 90 Minutes Intravenous Every 12 hours 11/06/21 1901 11/11/21 0328   11/04/21 1800  vancomycin (VANCOCIN) IVPB 1000 mg/200 mL premix  Status:  Discontinued        1,000 mg 200 mL/hr over 60 Minutes Intravenous Every 12 hours 11/04/21 0400 11/06/21 1901   11/04/21 0500  vancomycin (VANCOCIN) IVPB 1000 mg/200 mL premix        1,000 mg 200 mL/hr over 60 Minutes Intravenous  Once 11/04/21 0400 11/04/21 0906   11/03/21 1530  ceFEPIme (MAXIPIME) 2 g in sodium chloride 0.9 % 100 mL IVPB  Status:  Discontinued        2 g 200 mL/hr over 30 Minutes Intravenous Every 8 hours 11/03/21 1436 11/06/21 0908   11/03/21 1353  vancomycin variable dose per unstable renal function (pharmacist dosing)  Status:  Discontinued         Does not apply See admin instructions 11/03/21 1353 11/04/21 0400   10/25/21 1600  micafungin (MYCAMINE) 150 mg in sodium chloride 0.9 % 100 mL IVPB  Status:  Discontinued       See Hyperspace for full Linked Orders Report.   150 mg 107.5 mL/hr over 1 Hours Intravenous Every 24 hours 10/24/21 1301 10/24/21 1435   10/24/21 2200  vancomycin (VANCOREADY) IVPB 1250 mg/250 mL  Status:  Discontinued        1,250 mg 166.7 mL/hr over 90 Minutes Intravenous Every 12 hours 10/24/21 1435 11/03/21 1353   10/24/21 1600  fluconazole (DIFLUCAN) tablet 800 mg  Status:  Discontinued        800 mg Oral  Once 10/24/21 1300 10/24/21 1435   10/24/21 1600  micafungin (MYCAMINE) 150 mg in sodium chloride 0.9 % 100 mL IVPB       See Hyperspace for  full Linked Orders Report.   150 mg 107.5 mL/hr over 1 Hours Intravenous Every 24 hours 10/24/21 1435     10/24/21 1100  linezolid (ZYVOX) tablet 600 mg  Status:  Discontinued        600 mg Oral Every 12 hours 10/24/21 0950 10/24/21 1509   10/19/21 2200  vancomycin (VANCOREADY) IVPB 1250 mg/250 mL  Status:  Discontinued        1,250 mg 166.7 mL/hr over 90 Minutes Intravenous Every 12  hours 10/19/21 1341 10/24/21 0950   10/18/21 2200  linezolid (ZYVOX) tablet 600 mg  Status:  Discontinued        600 mg Oral Every 12 hours 10/18/21 1600 10/19/21 1341   10/18/21 1700  fluconazole (DIFLUCAN) tablet 800 mg        800 mg Oral  Once 10/18/21 1600 10/18/21 1657   10/17/21 2200  vancomycin (VANCOREADY) IVPB 1250 mg/250 mL  Status:  Discontinued        1,250 mg 166.7 mL/hr over 90 Minutes Intravenous Every 12 hours 10/17/21 1144 10/18/21 1600   10/17/21 1600  micafungin (MYCAMINE) 150 mg in sodium chloride 0.9 % 100 mL IVPB  Status:  Discontinued       See Hyperspace for full Linked Orders Report.   150 mg 107.5 mL/hr over 1 Hours Intravenous Every 24 hours 10/14/21 1115 10/24/21 1301   10/14/21 1600  micafungin (MYCAMINE) 150 mg in sodium chloride 0.9 % 100 mL IVPB       See Hyperspace for full Linked Orders Report.   150 mg 107.5 mL/hr over 1 Hours Intravenous Every 24 hours 10/14/21 1115 10/16/21 1746   10/13/21 2200  vancomycin (VANCOREADY) IVPB 1500 mg/300 mL  Status:  Discontinued        1,500 mg 150 mL/hr over 120 Minutes Intravenous Every 12 hours 10/13/21 1321 10/17/21 1144   10/12/21 1700  micafungin (MYCAMINE) 150 mg in sodium chloride 0.9 % 100 mL IVPB  Status:  Discontinued        150 mg 115 mL/hr over 1 Hours Intravenous Every 24 hours 10/12/21 1601 10/14/21 1115   10/11/21 2200  vancomycin (VANCOREADY) IVPB 1250 mg/250 mL  Status:  Discontinued        1,250 mg 166.7 mL/hr over 90 Minutes Intravenous Every 12 hours 10/11/21 0903 10/13/21 1321   10/11/21 1000  linezolid (ZYVOX)  tablet 600 mg  Status:  Discontinued        600 mg Oral 2 times daily 10/11/21 0149 10/11/21 0813   10/11/21 1000  ceFEPIme (MAXIPIME) 2 g in sodium chloride 0.9 % 100 mL IVPB  Status:  Discontinued        2 g 200 mL/hr over 30 Minutes Intravenous Every 8 hours 10/11/21 0901 10/16/21 0938   10/11/21 1000  vancomycin (VANCOREADY) IVPB 1750 mg/350 mL        1,750 mg 175 mL/hr over 120 Minutes Intravenous  Once 10/11/21 0901 10/11/21 1242        Medications  Scheduled Meds:  Chlorhexidine Gluconate Cloth  6 each Topical Daily   dabigatran  150 mg Oral Q12H   feeding supplement  1 Container Oral BID BM   ferrous gluconate  324 mg Oral Q breakfast   gabapentin  400 mg Oral TID   melatonin  3 mg Oral QHS   oxyCODONE  10 mg Oral Q12H   pantoprazole  40 mg Oral Daily   polyethylene glycol  17 g Oral Daily   potassium chloride  40 mEq Oral Daily   senna-docusate  1 tablet Oral QHS   sodium chloride flush  10-40 mL Intracatheter Q12H   Continuous Infusions:  sodium chloride 10 mL/hr at 11/15/21 1002   micafungin (MYCAMINE) 150 mg in sodium chloride 0.9 % 100 mL IVPB 150 mg (11/15/21 1700)   vancomycin 1,000 mg (11/16/21 1043)   PRN Meds:.sodium chloride, acetaminophen **OR** acetaminophen, benzonatate, chlorpheniramine-HYDROcodone, docusate sodium, guaiFENesin, hydrALAZINE, menthol-cetylpyridinium, ondansetron **OR** ondansetron (ZOFRAN) IV, oxyCODONE-acetaminophen **OR** [DISCONTINUED]  morphine injection,  polyethylene glycol, prochlorperazine, sodium chloride flush    Subjective:   Latoya [name] was seen and examined today. Wants to talk to Dr Sherlynn Stalls foot regarding the timing of the valve repair. No new complaints.   Objective:   Vitals:   11/15/21 1618 11/15/21 2029 11/16/21 0403 11/16/21 1419  BP: (!) 125/104 (!) 145/101 108/88 (!) 144/121  Pulse: (!) 102 92 (!) 102 (!) 105  Resp: (!) 22 20 18  (!) 22  Temp: 98.5 F (36.9 C) 98.3 F (36.8 C) 98.4 F (36.9 C) 98.7 F (37.1  C)  TempSrc: Oral Oral Oral Oral  SpO2: 95%  94%   Weight:   88.5 kg   Height:        Intake/Output Summary (Last 24 hours) at 11/16/2021 1503 Last data filed at 11/16/2021 1100 Gross per 24 hour  Intake 1937 ml  Output --  Net 1937 ml    Filed Weights   11/14/21 0336 11/15/21 0600 11/16/21 0403  Weight: 90 kg 89.5 kg 88.5 kg     Exam General exam: Appears calm and comfortable  Respiratory system: Clear to auscultation. Respiratory effort normal. Cardiovascular system: S1 & S2 heard, RRR. No JVD, No pedal edema. Gastrointestinal system: Abdomen is nondistended, soft and nontender. Normal bowel sounds heard. Central nervous system: Alert and oriented. No focal neurological deficits. Extremities: Symmetric 5 x 5 power. Skin: No rashes, lesions or ulcers Psychiatry: flat affect.       Data Reviewed:  I have personally reviewed following labs and imaging studies   CBC Lab Results  Component Value Date   WBC 10.1 11/14/2021   RBC 4.06 11/14/2021   HGB 10.2 (L) 11/14/2021   HCT 35.9 (L) 11/14/2021   MCV 88.4 11/14/2021   MCH 25.1 (L) 11/14/2021   PLT 182 11/14/2021   MCHC 28.4 (L) 11/14/2021   RDW 29.3 (H) 11/14/2021   LYMPHSABS 3.7 10/29/2021   MONOABS 1.3 (H) 10/29/2021   EOSABS 0.9 (H) 10/29/2021   BASOSABS 0.0 10/29/2021     Last metabolic panel Lab Results  Component Value Date   NA 138 11/16/2021   K 4.3 11/16/2021   CL 105 11/16/2021   CO2 26 11/16/2021   BUN 6 11/16/2021   CREATININE 0.69 11/16/2021   GLUCOSE 86 11/16/2021   GFRNONAA >60 11/16/2021   GFRAA >60 08/07/2019   CALCIUM 8.9 11/16/2021   PHOS 3.6 10/25/2021   PROT 7.5 10/25/2021   ALBUMIN 2.1 (L) 10/25/2021   BILITOT 0.2 (L) 10/25/2021   ALKPHOS 57 10/25/2021   AST 13 (L) 10/25/2021   ALT 7 10/25/2021   ANIONGAP 7 11/16/2021    CBG (last 3)  No results for input(s): "GLUCAP" in the last 72 hours.    Coagulation Profile: No results for input(s): "INR", "PROTIME" in the  last 168 hours.   Radiology Studies: No results found.     11/18/2021 M.D. [facility] 11/16/2021, 3:03 PM  Available via Epic secure chat 7am-7pm After 7 pm, please refer to night coverage provider listed on amion.

## 2021-11-17 ENCOUNTER — Inpatient Hospital Stay (HOSPITAL_COMMUNITY): Payer: Medicaid Other

## 2021-11-17 DIAGNOSIS — G8929 Other chronic pain: Secondary | ICD-10-CM | POA: Diagnosis not present

## 2021-11-17 DIAGNOSIS — I2692 Saddle embolus of pulmonary artery without acute cor pulmonale: Secondary | ICD-10-CM | POA: Diagnosis not present

## 2021-11-17 DIAGNOSIS — R079 Chest pain, unspecified: Secondary | ICD-10-CM | POA: Diagnosis not present

## 2021-11-17 DIAGNOSIS — M4646 Discitis, unspecified, lumbar region: Secondary | ICD-10-CM | POA: Diagnosis not present

## 2021-11-17 DIAGNOSIS — R059 Cough, unspecified: Secondary | ICD-10-CM | POA: Diagnosis not present

## 2021-11-17 DIAGNOSIS — F1910 Other psychoactive substance abuse, uncomplicated: Secondary | ICD-10-CM | POA: Diagnosis not present

## 2021-11-17 LAB — CBC
HCT: 36.5 % (ref 36.0–46.0)
Hemoglobin: 10.3 g/dL — ABNORMAL LOW (ref 12.0–15.0)
MCH: 25.3 pg — ABNORMAL LOW (ref 26.0–34.0)
MCHC: 28.2 g/dL — ABNORMAL LOW (ref 30.0–36.0)
MCV: 89.7 fL (ref 80.0–100.0)
Platelets: 230 10*3/uL (ref 150–400)
RBC: 4.07 MIL/uL (ref 3.87–5.11)
RDW: 27.6 % — ABNORMAL HIGH (ref 11.5–15.5)
WBC: 8.1 10*3/uL (ref 4.0–10.5)
nRBC: 0 % (ref 0.0–0.2)

## 2021-11-17 NOTE — Progress Notes (Signed)
[facility]                                                                               Latoya [name], is a 36 y.o. female, DOB - 27-Mar-1986, QAE:[id number] Admit date - 10/11/2021    Outpatient Primary MD for the patient is Pcp, No  LOS - 37  days    Brief summary   36 year old female past medical history of intravenous drug abuse (now reports abstinence),  MSSA tricuspid valve endocarditis in 2020 (Treated with angiovac and course of Cefazolin), MRSA L4-L5 facet join septic discitis in 07/2019, MRSA bacteremia with infective endocarditis and tricuspid valve vegetation diagnosed 07/24/21 who presented as a transfer from [facility] for saddle pulmonary embolism.  Blood cultures drawn on 10/11/2021 have grown Candida albicans, with negative repeat culture on 10/14/2021.  MRI L-spine done on 10/07/2021 with findings concerning for septic arthritis.  She is currently on micafungin and IV vancomycin, with infectious disease guidance, tentative end date 11/25/2021.   Cardiothoracic surgery was consulted, noting evidence of PFO at previous Angiovac attempt, stating only option would be for surgical valve repair and PFO closure. They recommended outpatient follow up after sustained abstinence of illicit drugs.  Plan to continue with IV vancomycin and Micafungin till 11/25/2021. ID recommends oral fluconazole due to concern for candida endocarditis for possible 6 to 12 months.  Will Request Dr Cliffton Asters to talk to the patient regarding the timing of the valve repair.      Assessment & Plan    Assessment and Plan: Acute saddle pulmonary embolism (HCC) -Failure to DOACs, developed while on eliquis. Echo does not show any RV strain.  - she was started on Pradaxa after speaking with Dr Myna Hidalgo.  - she is currently sob and coughing occasionally - CXR  today shows resolving pneumonia.    Sepsis, multifactorial, POA with candidemia, endocarditis, discitis IVDU history, reports  abstinence since March 2023 Counseled on cessation -Complete course of IV antibiotics 11/25/2021 as recommended by infectious disease. - she will be on anti fungal treatment probably fluconazole for possibly 6 to 12 months.    Pneumonia left lower lobe HCAP versus septic emboli on prior admission Continue with IV antibiotics and Pradaxa.  Repeat CXR shows improvement.    Candidemia, tricuspid valve endocarditis, MRSA bacteremia, POA -Large vegetation. CT VS consulted, recommended follow-up after discharge for consideration of surgical valve repair and PFO closure after sustained abstinence of illicit drugs  -LUE PICC placed by IR 6/15, replaced 6/24  then on 7/7 due to kinking.  Blood cultures have been negative so far.  -ID onboard and recommend to continue with IV vancomycin and Micafungin.  Leukocytosis resolved.    Lumbar discitis Pain control and IV antibiotics.    Volume overload, pulmonary edema  Appears to have resolved. She is weaned off oxygen.     Resolved hypokalemia, hypomagnesemia Repeat levels wnl.   Resolved mild hyponatremia Encourage adequate oral intake.   Chronic normocytic anemia Hemoglobin stable around 10.    Resolved thrombocytopenia   Chronic pain, pleuritic chest pain -Continue Neurontin 400 mg 3 times daily   Obesity Estimated body mass index is 32.31 kg/m as calculated from  the following:   Height as of this encounter: 5\' 6"  (1.676 m).   Weight as of this encounter: 90.8 kg. Recommend weight loss outpatient with regular physical activity and healthy dieting.  Anemia of chronic disease:  Hemoglobin stable around 10.   Hypertension:  BP parameters are optimal.  Will add prn hydralazine.   GERD:  On PPI.    Encourage oob and therapy evaluations.    Estimated body mass index is 31.7 kg/m as calculated from the following:   Height as of this encounter: 5\' 6"  (1.676 m).   Weight as of this encounter: 89.1 kg.  Code Status: full code.   DVT Prophylaxis:   dabigatran (PRADAXA) capsule 150 mg   Level of Care: Level of care: Med-Surg Family Communication: none at bedside.   Disposition Plan:     Remains inpatient appropriate:  IV antibiotics.   Procedures:  None.   Consultants:   IR  ID  Antimicrobials:   Anti-infectives (From admission, onward)    Start     Dose/Rate Route Frequency Ordered Stop   11/11/21 0630  vancomycin (VANCOCIN) IVPB 1000 mg/200 mL premix        1,000 mg 200 mL/hr over 60 Minutes Intravenous Every 12 hours 11/11/21 0328     11/07/21 0500  vancomycin (VANCOREADY) IVPB 1250 mg/250 mL  Status:  Discontinued        1,250 mg 166.7 mL/hr over 90 Minutes Intravenous Every 12 hours 11/06/21 1901 11/11/21 0328   11/04/21 1800  vancomycin (VANCOCIN) IVPB 1000 mg/200 mL premix  Status:  Discontinued        1,000 mg 200 mL/hr over 60 Minutes Intravenous Every 12 hours 11/04/21 0400 11/06/21 1901   11/04/21 0500  vancomycin (VANCOCIN) IVPB 1000 mg/200 mL premix        1,000 mg 200 mL/hr over 60 Minutes Intravenous  Once 11/04/21 0400 11/04/21 0906   11/03/21 1530  ceFEPIme (MAXIPIME) 2 g in sodium chloride 0.9 % 100 mL IVPB  Status:  Discontinued        2 g 200 mL/hr over 30 Minutes Intravenous Every 8 hours 11/03/21 1436 11/06/21 0908   11/03/21 1353  vancomycin variable dose per unstable renal function (pharmacist dosing)  Status:  Discontinued         Does not apply See admin instructions 11/03/21 1353 11/04/21 0400   10/25/21 1600  micafungin (MYCAMINE) 150 mg in sodium chloride 0.9 % 100 mL IVPB  Status:  Discontinued       See Hyperspace for full Linked Orders Report.   150 mg 107.5 mL/hr over 1 Hours Intravenous Every 24 hours 10/24/21 1301 10/24/21 1435   10/24/21 2200  vancomycin (VANCOREADY) IVPB 1250 mg/250 mL  Status:  Discontinued        1,250 mg 166.7 mL/hr over 90 Minutes Intravenous Every 12 hours 10/24/21 1435 11/03/21 1353   10/24/21 1600  fluconazole (DIFLUCAN) tablet 800 mg   Status:  Discontinued        800 mg Oral  Once 10/24/21 1300 10/24/21 1435   10/24/21 1600  micafungin (MYCAMINE) 150 mg in sodium chloride 0.9 % 100 mL IVPB       See Hyperspace for full Linked Orders Report.   150 mg 107.5 mL/hr over 1 Hours Intravenous Every 24 hours 10/24/21 1435     10/24/21 1100  linezolid (ZYVOX) tablet 600 mg  Status:  Discontinued        600 mg Oral Every 12 hours 10/24/21 0950 10/24/21  1509   10/19/21 2200  vancomycin (VANCOREADY) IVPB 1250 mg/250 mL  Status:  Discontinued        1,250 mg 166.7 mL/hr over 90 Minutes Intravenous Every 12 hours 10/19/21 1341 10/24/21 0950   10/18/21 2200  linezolid (ZYVOX) tablet 600 mg  Status:  Discontinued        600 mg Oral Every 12 hours 10/18/21 1600 10/19/21 1341   10/18/21 1700  fluconazole (DIFLUCAN) tablet 800 mg        800 mg Oral  Once 10/18/21 1600 10/18/21 1657   10/17/21 2200  vancomycin (VANCOREADY) IVPB 1250 mg/250 mL  Status:  Discontinued        1,250 mg 166.7 mL/hr over 90 Minutes Intravenous Every 12 hours 10/17/21 1144 10/18/21 1600   10/17/21 1600  micafungin (MYCAMINE) 150 mg in sodium chloride 0.9 % 100 mL IVPB  Status:  Discontinued       See Hyperspace for full Linked Orders Report.   150 mg 107.5 mL/hr over 1 Hours Intravenous Every 24 hours 10/14/21 1115 10/24/21 1301   10/14/21 1600  micafungin (MYCAMINE) 150 mg in sodium chloride 0.9 % 100 mL IVPB       See Hyperspace for full Linked Orders Report.   150 mg 107.5 mL/hr over 1 Hours Intravenous Every 24 hours 10/14/21 1115 10/16/21 1746   10/13/21 2200  vancomycin (VANCOREADY) IVPB 1500 mg/300 mL  Status:  Discontinued        1,500 mg 150 mL/hr over 120 Minutes Intravenous Every 12 hours 10/13/21 1321 10/17/21 1144   10/12/21 1700  micafungin (MYCAMINE) 150 mg in sodium chloride 0.9 % 100 mL IVPB  Status:  Discontinued        150 mg 115 mL/hr over 1 Hours Intravenous Every 24 hours 10/12/21 1601 10/14/21 1115   10/11/21 2200  vancomycin  (VANCOREADY) IVPB 1250 mg/250 mL  Status:  Discontinued        1,250 mg 166.7 mL/hr over 90 Minutes Intravenous Every 12 hours 10/11/21 0903 10/13/21 1321   10/11/21 1000  linezolid (ZYVOX) tablet 600 mg  Status:  Discontinued        600 mg Oral 2 times daily 10/11/21 0149 10/11/21 0813   10/11/21 1000  ceFEPIme (MAXIPIME) 2 g in sodium chloride 0.9 % 100 mL IVPB  Status:  Discontinued        2 g 200 mL/hr over 30 Minutes Intravenous Every 8 hours 10/11/21 0901 10/16/21 0938   10/11/21 1000  vancomycin (VANCOREADY) IVPB 1750 mg/350 mL        1,750 mg 175 mL/hr over 120 Minutes Intravenous  Once 10/11/21 0901 10/11/21 1242        Medications  Scheduled Meds:  Chlorhexidine Gluconate Cloth  6 each Topical Daily   dabigatran  150 mg Oral Q12H   feeding supplement  1 Container Oral BID BM   ferrous gluconate  324 mg Oral Q breakfast   gabapentin  400 mg Oral TID   melatonin  3 mg Oral QHS   oxyCODONE  10 mg Oral Q12H   pantoprazole  40 mg Oral Daily   polyethylene glycol  17 g Oral Daily   senna-docusate  1 tablet Oral QHS   sodium chloride flush  10-40 mL Intracatheter Q12H   Continuous Infusions:  sodium chloride 10 mL/hr at 11/15/21 1002   micafungin (MYCAMINE) 150 mg in sodium chloride 0.9 % 100 mL IVPB 150 mg (11/17/21 1611)   vancomycin 1,000 mg (11/17/21 0959)  PRN Meds:.sodium chloride, acetaminophen **OR** acetaminophen, benzonatate, chlorpheniramine-HYDROcodone, docusate sodium, guaiFENesin, hydrALAZINE, menthol-cetylpyridinium, ondansetron **OR** ondansetron (ZOFRAN) IV, oxyCODONE-acetaminophen **OR** [DISCONTINUED]  morphine injection, polyethylene glycol, prochlorperazine, sodium chloride flush    Subjective:   Latoya [name] was seen and examined today. Some coughing, but no new complaints.   Objective:   Vitals:   11/16/21 1555 11/16/21 2017 11/17/21 0355 11/17/21 1719  BP: (!) 152/114 (!) 136/108 (!) 152/117 (!) 124/96  Pulse:  99 (!) 106 (!) 107  Resp:     18  Temp:  98.5 F (36.9 C) 98.7 F (37.1 C) 99.4 F (37.4 C)  TempSrc:  Oral Oral Oral  SpO2:  97% 97% 98%  Weight:   89.1 kg   Height:        Intake/Output Summary (Last 24 hours) at 11/17/2021 1839 Last data filed at 11/17/2021 1352 Gross per 24 hour  Intake 1698 ml  Output --  Net 1698 ml    Filed Weights   11/15/21 0600 11/16/21 0403 11/17/21 0355  Weight: 89.5 kg 88.5 kg 89.1 kg     Exam General exam: Appears calm and comfortable  Respiratory system: Clear to auscultation. Respiratory effort normal. Cardiovascular system: S1 & S2 heard, tachycardic. No JVD, No pedal edema. Gastrointestinal system: Abdomen is nondistended, soft and nontender. Normal bowel sounds heard. Central nervous system: Alert and oriented. No focal neurological deficits. Extremities: Symmetric 5 x 5 power. Skin: No rashes, lesions or ulcers Psychiatry:  Mood & affect appropriate.        Data Reviewed:  I have personally reviewed following labs and imaging studies   CBC Lab Results  Component Value Date   WBC 8.1 11/17/2021   RBC 4.07 11/17/2021   HGB 10.3 (L) 11/17/2021   HCT 36.5 11/17/2021   MCV 89.7 11/17/2021   MCH 25.3 (L) 11/17/2021   PLT 230 11/17/2021   MCHC 28.2 (L) 11/17/2021   RDW 27.6 (H) 11/17/2021   LYMPHSABS 3.7 10/29/2021   MONOABS 1.3 (H) 10/29/2021   EOSABS 0.9 (H) 10/29/2021   BASOSABS 0.0 10/29/2021     Last metabolic panel Lab Results  Component Value Date   NA 138 11/16/2021   K 4.3 11/16/2021   CL 105 11/16/2021   CO2 26 11/16/2021   BUN 6 11/16/2021   CREATININE 0.69 11/16/2021   GLUCOSE 86 11/16/2021   GFRNONAA >60 11/16/2021   GFRAA >60 08/07/2019   CALCIUM 8.9 11/16/2021   PHOS 3.6 10/25/2021   PROT 7.5 10/25/2021   ALBUMIN 2.1 (L) 10/25/2021   BILITOT 0.2 (L) 10/25/2021   ALKPHOS 57 10/25/2021   AST 13 (L) 10/25/2021   ALT 7 10/25/2021   ANIONGAP 7 11/16/2021    CBG (last 3)  No results for input(s): "GLUCAP" in the last 72  hours.    Coagulation Profile: No results for input(s): "INR", "PROTIME" in the last 168 hours.   Radiology Studies: DG Chest 2 View  Result Date: 11/17/2021 CLINICAL DATA:  Cough EXAM: CHEST - 2 VIEW COMPARISON:  Previous studies including the examination of 11/02/2021 FINDINGS: Transverse diameter of the heart is increased. There are patchy infiltrates in both parahilar regions, more so on the right side. There is interval decrease in infiltrates in right parahilar region and left lower lung field. No new focal pulmonary consolidation is seen. There are no signs of alveolar pulmonary edema. There is minimal blunting of lateral CP angles. There is no pneumothorax. Tip of PICC line is seen in the region of the superior  vena cava. IMPRESSION: There is interval partial clearing of infiltrates in both lungs suggesting resolving pneumonia. Still, there are residual patchy infiltrates in both perihilar regions, more so on the right side suggesting residual multifocal pneumonia. No new focal pulmonary consolidation is seen. Possible minimal bilateral pleural effusions. Electronically Signed   By: Ernie Avena M.D.   On: 11/17/2021 15:45       Kathlen Mody M.D. [facility] 11/17/2021, 6:39 PM  Available via Epic secure chat 7am-7pm After 7 pm, please refer to night coverage provider listed on amion.

## 2021-11-17 NOTE — Progress Notes (Signed)
PT Cancellation Note  Patient Details Name: Tavonna Worthington MRN: [id number] DOB: [birth year]   Cancelled Treatment:    Reason Eval/Treat Not Completed: Patient declined, no reason specified Pt continues to decline mobility, refused for 3 days straight. States she does not want to walk and does not need too. Explained reason and importance of mobility but pt continues to state she does not want too. Will sign off as pt has no interest in working with PT. Thanks.   Blake Divine A Anderia Lorenzo 11/17/2021, 9:16 AM [place], PT, DPT [facility] Secure chat preferred Office [phone]

## 2021-11-18 DIAGNOSIS — I2692 Saddle embolus of pulmonary artery without acute cor pulmonale: Secondary | ICD-10-CM | POA: Diagnosis not present

## 2021-11-18 DIAGNOSIS — F1910 Other psychoactive substance abuse, uncomplicated: Secondary | ICD-10-CM | POA: Diagnosis not present

## 2021-11-18 DIAGNOSIS — G8929 Other chronic pain: Secondary | ICD-10-CM | POA: Diagnosis not present

## 2021-11-18 DIAGNOSIS — M4646 Discitis, unspecified, lumbar region: Secondary | ICD-10-CM | POA: Diagnosis not present

## 2021-11-18 MED ORDER — AMLODIPINE BESYLATE 2.5 MG PO TABS
2.5000 mg | ORAL_TABLET | Freq: Every day | ORAL | Status: DC
Start: 2021-11-18 — End: 2021-11-19
  Administered 2021-11-18 – 2021-11-19 (×2): 2.5 mg via ORAL
  Filled 2021-11-18 (×2): qty 1

## 2021-11-18 MED ORDER — GUAIFENESIN-DM 100-10 MG/5ML PO SYRP
15.0000 mL | ORAL_SOLUTION | ORAL | Status: DC | PRN
Start: 2021-11-18 — End: 2021-11-26
  Administered 2021-11-18 – 2021-11-26 (×22): 15 mL via ORAL
  Filled 2021-11-18 (×22): qty 15

## 2021-11-18 MED ORDER — AMLODIPINE BESYLATE 5 MG PO TABS: 5.0000 mg | ORAL_TABLET | Freq: Every day | ORAL | Status: DC

## 2021-11-18 NOTE — Progress Notes (Signed)
[facility]                                                                               Latoya [name], is a 36 y.o. female, DOB - 05-03-1986, VXB:[id number] Admit date - 10/11/2021    Outpatient Primary MD for the patient is Pcp, No  LOS - 38  days    Brief summary   36 year old female past medical history of intravenous drug abuse (now reports abstinence),  MSSA tricuspid valve endocarditis in 2020 (Treated with angiovac and course of Cefazolin), MRSA L4-L5 facet join septic discitis in 07/2019, MRSA bacteremia with infective endocarditis and tricuspid valve vegetation diagnosed 07/24/21 who presented as a transfer from [facility] for saddle pulmonary embolism.  Blood cultures drawn on 10/11/2021 have grown Candida albicans, with negative repeat culture on 10/14/2021.  MRI L-spine done on 10/07/2021 with findings concerning for septic arthritis.  She is currently on micafungin and IV vancomycin, with infectious disease guidance, tentative end date 11/25/2021.   Cardiothoracic surgery was consulted, noting evidence of PFO at previous Angiovac attempt, stating only option would be for surgical valve repair and PFO closure. They recommended outpatient follow up after sustained abstinence of illicit drugs.  Plan to continue with IV vancomycin and Micafungin till 11/25/2021. ID recommends oral fluconazole due to concern for candida endocarditis for possible 6 to 12 months.  Will Request Dr Cliffton Asters to talk to the patient regarding the timing of the valve repair.  Pt seen and examined, she reports persistent cough. Will add robitussin.      Assessment & Plan    Assessment and Plan:   Acute saddle pulmonary embolism (HCC) -Failure to DOACs, developed while on eliquis. Echo does not show any RV strain.  - she was started on Pradaxa after speaking with Dr Myna Hidalgo.  - - occasional cough, CXR shows resolving pneumonia.    Sepsis, multifactorial, POA with candidemia,  endocarditis, discitis IVDU history, reports abstinence since March 2023 Counseled on cessation -Complete course of IV antibiotics 11/25/2021 as recommended by infectious disease. - she will be on anti fungal treatment probably fluconazole for possibly 6 to 12 months.  - discussed the plan with the patient today in detail.    Pneumonia left lower lobe HCAP versus septic emboli on prior admission Continue with IV antibiotics and Pradaxa.  Repeat CXR shows improvement. Symptomatic relief from cough with robitussin and hydrocodone cough syrup, tessalon.  Recommend to continue with IS and flutter valve.    Candidemia, tricuspid valve endocarditis, MRSA bacteremia, POA -Large vegetation. CT VS consulted, recommended follow-up after discharge for consideration of surgical valve repair and PFO closure after sustained abstinence of illicit drugs  -LUE PICC placed by IR 6/15, replaced 6/24  then on 7/7 due to kinking.  Blood cultures have been negative so far.  -ID onboard and recommend to continue with IV vancomycin and Micafungin.  Leukocytosis resolved.    Lumbar discitis Pain control and IV antibiotics.    Volume overload, pulmonary edema  Appears to have resolved. She is weaned off oxygen.     Resolved hypokalemia, hypomagnesemia Repeat levels wnl.   Resolved mild hyponatremia Encourage adequate oral intake.   Chronic  normocytic anemia Hemoglobin stable around 10.    Resolved thrombocytopenia   Chronic pain, pleuritic chest pain -Continue Neurontin 400 mg 3 times daily   Obesity Estimated body mass index is 32.31 kg/m as calculated from the following:   Height as of this encounter: 5\' 6"  (1.676 m).   Weight as of this encounter: 90.8 kg. Recommend weight loss outpatient with regular physical activity and healthy dieting.  Anemia of chronic disease:  Hemoglobin stable around 10.   Hypertension:  DBP in 100 mmhg.  Started her on low dose norvasc 2.5 mg daily.  Will add prn  hydralazine.   GERD:  On PPI.    Encourage oob and therapy evaluations.    Estimated body mass index is 31.67 kg/m as calculated from the following:   Height as of this encounter: 5\' 6"  (1.676 m).   Weight as of this encounter: 89 kg.  Code Status: full code.  DVT Prophylaxis:   dabigatran (PRADAXA) capsule 150 mg   Level of Care: Level of care: Med-Surg Family Communication: none at bedside.   Disposition Plan:     Remains inpatient appropriate:  IV antibiotics.   Procedures:  None.   Consultants:   IR  ID  Antimicrobials:   Anti-infectives (From admission, onward)    Start     Dose/Rate Route Frequency Ordered Stop   11/11/21 0630  vancomycin (VANCOCIN) IVPB 1000 mg/200 mL premix        1,000 mg 200 mL/hr over 60 Minutes Intravenous Every 12 hours 11/11/21 0328     11/07/21 0500  vancomycin (VANCOREADY) IVPB 1250 mg/250 mL  Status:  Discontinued        1,250 mg 166.7 mL/hr over 90 Minutes Intravenous Every 12 hours 11/06/21 1901 11/11/21 0328   11/04/21 1800  vancomycin (VANCOCIN) IVPB 1000 mg/200 mL premix  Status:  Discontinued        1,000 mg 200 mL/hr over 60 Minutes Intravenous Every 12 hours 11/04/21 0400 11/06/21 1901   11/04/21 0500  vancomycin (VANCOCIN) IVPB 1000 mg/200 mL premix        1,000 mg 200 mL/hr over 60 Minutes Intravenous  Once 11/04/21 0400 11/04/21 0906   11/03/21 1530  ceFEPIme (MAXIPIME) 2 g in sodium chloride 0.9 % 100 mL IVPB  Status:  Discontinued        2 g 200 mL/hr over 30 Minutes Intravenous Every 8 hours 11/03/21 1436 11/06/21 0908   11/03/21 1353  vancomycin variable dose per unstable renal function (pharmacist dosing)  Status:  Discontinued         Does not apply See admin instructions 11/03/21 1353 11/04/21 0400   10/25/21 1600  micafungin (MYCAMINE) 150 mg in sodium chloride 0.9 % 100 mL IVPB  Status:  Discontinued       See Hyperspace for full Linked Orders Report.   150 mg 107.5 mL/hr over 1 Hours Intravenous Every 24  hours 10/24/21 1301 10/24/21 1435   10/24/21 2200  vancomycin (VANCOREADY) IVPB 1250 mg/250 mL  Status:  Discontinued        1,250 mg 166.7 mL/hr over 90 Minutes Intravenous Every 12 hours 10/24/21 1435 11/03/21 1353   10/24/21 1600  fluconazole (DIFLUCAN) tablet 800 mg  Status:  Discontinued        800 mg Oral  Once 10/24/21 1300 10/24/21 1435   10/24/21 1600  micafungin (MYCAMINE) 150 mg in sodium chloride 0.9 % 100 mL IVPB       See Hyperspace for full Linked Orders  Report.   150 mg 107.5 mL/hr over 1 Hours Intravenous Every 24 hours 10/24/21 1435     10/24/21 1100  linezolid (ZYVOX) tablet 600 mg  Status:  Discontinued        600 mg Oral Every 12 hours 10/24/21 0950 10/24/21 1509   10/19/21 2200  vancomycin (VANCOREADY) IVPB 1250 mg/250 mL  Status:  Discontinued        1,250 mg 166.7 mL/hr over 90 Minutes Intravenous Every 12 hours 10/19/21 1341 10/24/21 0950   10/18/21 2200  linezolid (ZYVOX) tablet 600 mg  Status:  Discontinued        600 mg Oral Every 12 hours 10/18/21 1600 10/19/21 1341   10/18/21 1700  fluconazole (DIFLUCAN) tablet 800 mg        800 mg Oral  Once 10/18/21 1600 10/18/21 1657   10/17/21 2200  vancomycin (VANCOREADY) IVPB 1250 mg/250 mL  Status:  Discontinued        1,250 mg 166.7 mL/hr over 90 Minutes Intravenous Every 12 hours 10/17/21 1144 10/18/21 1600   10/17/21 1600  micafungin (MYCAMINE) 150 mg in sodium chloride 0.9 % 100 mL IVPB  Status:  Discontinued       See Hyperspace for full Linked Orders Report.   150 mg 107.5 mL/hr over 1 Hours Intravenous Every 24 hours 10/14/21 1115 10/24/21 1301   10/14/21 1600  micafungin (MYCAMINE) 150 mg in sodium chloride 0.9 % 100 mL IVPB       See Hyperspace for full Linked Orders Report.   150 mg 107.5 mL/hr over 1 Hours Intravenous Every 24 hours 10/14/21 1115 10/16/21 1746   10/13/21 2200  vancomycin (VANCOREADY) IVPB 1500 mg/300 mL  Status:  Discontinued        1,500 mg 150 mL/hr over 120 Minutes Intravenous Every  12 hours 10/13/21 1321 10/17/21 1144   10/12/21 1700  micafungin (MYCAMINE) 150 mg in sodium chloride 0.9 % 100 mL IVPB  Status:  Discontinued        150 mg 115 mL/hr over 1 Hours Intravenous Every 24 hours 10/12/21 1601 10/14/21 1115   10/11/21 2200  vancomycin (VANCOREADY) IVPB 1250 mg/250 mL  Status:  Discontinued        1,250 mg 166.7 mL/hr over 90 Minutes Intravenous Every 12 hours 10/11/21 0903 10/13/21 1321   10/11/21 1000  linezolid (ZYVOX) tablet 600 mg  Status:  Discontinued        600 mg Oral 2 times daily 10/11/21 0149 10/11/21 0813   10/11/21 1000  ceFEPIme (MAXIPIME) 2 g in sodium chloride 0.9 % 100 mL IVPB  Status:  Discontinued        2 g 200 mL/hr over 30 Minutes Intravenous Every 8 hours 10/11/21 0901 10/16/21 0938   10/11/21 1000  vancomycin (VANCOREADY) IVPB 1750 mg/350 mL        1,750 mg 175 mL/hr over 120 Minutes Intravenous  Once 10/11/21 0901 10/11/21 1242        Medications  Scheduled Meds:  amLODipine  2.5 mg Oral Daily   Chlorhexidine Gluconate Cloth  6 each Topical Daily   dabigatran  150 mg Oral Q12H   feeding supplement  1 Container Oral BID BM   ferrous gluconate  324 mg Oral Q breakfast   gabapentin  400 mg Oral TID   melatonin  3 mg Oral QHS   oxyCODONE  10 mg Oral Q12H   pantoprazole  40 mg Oral Daily   polyethylene glycol  17 g Oral Daily  senna-docusate  1 tablet Oral QHS   sodium chloride flush  10-40 mL Intracatheter Q12H   Continuous Infusions:  sodium chloride 10 mL/hr at 11/18/21 0200   micafungin (MYCAMINE) 150 mg in sodium chloride 0.9 % 100 mL IVPB 107.5 mL/hr at 11/18/21 0200   vancomycin Stopped (11/18/21 1326)   PRN Meds:.sodium chloride, acetaminophen **OR** acetaminophen, benzonatate, chlorpheniramine-HYDROcodone, docusate sodium, guaiFENesin-dextromethorphan, hydrALAZINE, menthol-cetylpyridinium, ondansetron **OR** ondansetron (ZOFRAN) IV, oxyCODONE-acetaminophen **OR** [DISCONTINUED]  morphine injection, polyethylene glycol,  prochlorperazine, sodium chloride flush    Subjective:   Latoya [name] was seen and examined today. Encouraged her to get oob and ambulate.  Some coughing. No new complaints.   Objective:   Vitals:   11/17/21 1719 11/17/21 2006 11/18/21 0500 11/18/21 1356  BP: (!) 124/96 (!) 135/108 (!) 143/110 (!) 118/107  Pulse: (!) 107 (!) 101 95 (!) 102  Resp: 18 19 18 17   Temp: 99.4 F (37.4 C) 97.8 F (36.6 C) 97.7 F (36.5 C) 97.8 F (36.6 C)  TempSrc: Oral Axillary Axillary Axillary  SpO2: 98%  97% 98%  Weight:   89 kg   Height:        Intake/Output Summary (Last 24 hours) at 11/18/2021 1539 Last data filed at 11/18/2021 0852 Gross per 24 hour  Intake 2420.33 ml  Output --  Net 2420.33 ml    Filed Weights   11/16/21 0403 11/17/21 0355 11/18/21 0500  Weight: 88.5 kg 89.1 kg 89 kg     Exam General exam: Appears calm and comfortable  Respiratory system: Clear to auscultation. Respiratory effort normal. Cardiovascular system: S1 & S2 heard, RRR. No JVD,   No pedal edema. Gastrointestinal system: Abdomen is nondistended, soft and nontender. Normal bowel sounds heard. Central nervous system: Alert and oriented. No focal neurological deficits. Extremities: Symmetric 5 x 5 power. Skin: No rashes, lesions or ulcers Psychiatry: Mood & affect appropriate.         Data Reviewed:  I have personally reviewed following labs and imaging studies   CBC Lab Results  Component Value Date   WBC 8.1 11/17/2021   RBC 4.07 11/17/2021   HGB 10.3 (L) 11/17/2021   HCT 36.5 11/17/2021   MCV 89.7 11/17/2021   MCH 25.3 (L) 11/17/2021   PLT 230 11/17/2021   MCHC 28.2 (L) 11/17/2021   RDW 27.6 (H) 11/17/2021   LYMPHSABS 3.7 10/29/2021   MONOABS 1.3 (H) 10/29/2021   EOSABS 0.9 (H) 10/29/2021   BASOSABS 0.0 10/29/2021     Last metabolic panel Lab Results  Component Value Date   NA 138 11/16/2021   K 4.3 11/16/2021   CL 105 11/16/2021   CO2 26 11/16/2021   BUN 6 11/16/2021    CREATININE 0.69 11/16/2021   GLUCOSE 86 11/16/2021   GFRNONAA >60 11/16/2021   GFRAA >60 08/07/2019   CALCIUM 8.9 11/16/2021   PHOS 3.6 10/25/2021   PROT 7.5 10/25/2021   ALBUMIN 2.1 (L) 10/25/2021   BILITOT 0.2 (L) 10/25/2021   ALKPHOS 57 10/25/2021   AST 13 (L) 10/25/2021   ALT 7 10/25/2021   ANIONGAP 7 11/16/2021    CBG (last 3)  No results for input(s): "GLUCAP" in the last 72 hours.    Coagulation Profile: No results for input(s): "INR", "PROTIME" in the last 168 hours.   Radiology Studies: DG Chest 2 View  Result Date: 11/17/2021 CLINICAL DATA:  Cough EXAM: CHEST - 2 VIEW COMPARISON:  Previous studies including the examination of 11/02/2021 FINDINGS: Transverse diameter of the heart is increased.  There are patchy infiltrates in both parahilar regions, more so on the right side. There is interval decrease in infiltrates in right parahilar region and left lower lung field. No new focal pulmonary consolidation is seen. There are no signs of alveolar pulmonary edema. There is minimal blunting of lateral CP angles. There is no pneumothorax. Tip of PICC line is seen in the region of the superior vena cava. IMPRESSION: There is interval partial clearing of infiltrates in both lungs suggesting resolving pneumonia. Still, there are residual patchy infiltrates in both perihilar regions, more so on the right side suggesting residual multifocal pneumonia. No new focal pulmonary consolidation is seen. Possible minimal bilateral pleural effusions. Electronically Signed   By: Ernie Avena M.D.   On: 11/17/2021 15:45       Kathlen Mody M.D. [facility] 11/18/2021, 3:39 PM  Available via Epic secure chat 7am-7pm After 7 pm, please refer to night coverage provider listed on amion.

## 2021-11-19 DIAGNOSIS — F1910 Other psychoactive substance abuse, uncomplicated: Secondary | ICD-10-CM | POA: Diagnosis not present

## 2021-11-19 DIAGNOSIS — G8929 Other chronic pain: Secondary | ICD-10-CM | POA: Diagnosis not present

## 2021-11-19 DIAGNOSIS — M4646 Discitis, unspecified, lumbar region: Secondary | ICD-10-CM | POA: Diagnosis not present

## 2021-11-19 DIAGNOSIS — I2692 Saddle embolus of pulmonary artery without acute cor pulmonale: Secondary | ICD-10-CM | POA: Diagnosis not present

## 2021-11-19 MED ORDER — AMLODIPINE BESYLATE 5 MG PO TABS
5.0000 mg | ORAL_TABLET | Freq: Every day | ORAL | Status: DC
  Administered 2021-11-20 – 2021-11-26 (×7): 5 mg via ORAL
  Filled 2021-11-19 (×7): qty 1

## 2021-11-19 NOTE — Progress Notes (Signed)
[facility]                                                                               Latoya [name], is a 36 y.o. female, DOB - 11-Nov-1985, OIN:[id number] Admit date - 10/11/2021    Outpatient Primary MD for the patient is Pcp, No  LOS - 39  days    Brief summary   36 year old female past medical history of intravenous drug abuse (now reports abstinence),  MSSA tricuspid valve endocarditis in 2020 (Treated with angiovac and course of Cefazolin), MRSA L4-L5 facet join septic discitis in 07/2019, MRSA bacteremia with infective endocarditis and tricuspid valve vegetation diagnosed 07/24/21 who presented as a transfer from [facility] for saddle pulmonary embolism.  Blood cultures drawn on 10/11/2021 have grown Candida albicans, with negative repeat culture on 10/14/2021.  MRI L-spine done on 10/07/2021 with findings concerning for septic arthritis.  She is currently on micafungin and IV vancomycin, with infectious disease guidance, tentative end date 11/25/2021.   Cardiothoracic surgery was consulted, noting evidence of PFO at previous Angiovac attempt, stating only option would be for surgical valve repair and PFO closure. They recommended outpatient follow up after sustained abstinence of illicit drugs.  Plan to continue with IV vancomycin and Micafungin till 11/25/2021. ID recommends oral fluconazole due to concern for candida endocarditis for possible 6 to 12 months.  Will Request Dr Cliffton Asters to talk to the patient regarding the timing of the valve repair.  Pt seen and examined, she reports persistent cough. Will add robitussin.      Assessment & Plan    Assessment and Plan:   Acute saddle pulmonary embolism (HCC) -Failure to DOACs, developed while on eliquis. Echo does not show any RV strain.  - she was started on Pradaxa after speaking with Dr Myna Hidalgo.  - - occasional cough, CXR shows resolving pneumonia.    Sepsis, multifactorial, POA with candidemia,  endocarditis, discitis IVDU history, reports abstinence since March 2023 Counseled on cessation -Complete course of IV antibiotics 11/25/2021 as recommended by infectious disease. - she will be on anti fungal treatment probably fluconazole for possibly 6 to 12 months.  - discussed the plan with the patient today in detail.    Pneumonia left lower lobe HCAP versus septic emboli on prior admission Continue with IV antibiotics and Pradaxa.  Repeat CXR shows improvement. Symptomatic relief from cough with robitussin and hydrocodone cough syrup, tessalon.  Recommend to continue with IS and flutter valve.    Candidemia, tricuspid valve endocarditis, MRSA bacteremia, POA -Large vegetation. CT VS consulted, recommended follow-up after discharge for consideration of surgical valve repair and PFO closure after sustained abstinence of illicit drugs  -LUE PICC placed by IR 6/15, replaced 6/24  then on 7/7 due to kinking.  Blood cultures have been negative so far.  -ID onboard and recommend to continue with IV vancomycin and Micafungin. Plan for fluconazole for another 6  to 12 months after micafungin.  Leukocytosis resolved.    Lumbar discitis Pain control and IV antibiotics.    Volume overload, pulmonary edema  Appears to have resolved. She is weaned off oxygen.     Resolved hypokalemia, hypomagnesemia Repeat levels wnl.  Resolved mild hyponatremia Encourage adequate oral intake.   Chronic normocytic anemia Hemoglobin stable around 10.    Resolved thrombocytopenia   Chronic pain, pleuritic chest pain -Continue Neurontin 400 mg 3 times daily   Obesity Estimated body mass index is 32.31 kg/m as calculated from the following:   Height as of this encounter: 5\' 6"  (1.676 m).   Weight as of this encounter: 90.8 kg. Recommend weight loss outpatient with regular physical activity and healthy dieting.  Anemia of chronic disease:  Hemoglobin stable around 10.   Hypertension:  Slightly  elevated, pt anxious today.  Continue with norvasc and prn hydralazine.     GERD:  On PPI.    Encourage oob and therapy evaluations.    Estimated body mass index is 31.46 kg/m as calculated from the following:   Height as of this encounter: 5\' 6"  (1.676 m).   Weight as of this encounter: 88.4 kg.  Code Status: full code.  DVT Prophylaxis:   dabigatran (PRADAXA) capsule 150 mg   Level of Care: Level of care: Med-Surg Family Communication: none at bedside. Discussed the plan with patient's mom.   Disposition Plan:     Remains inpatient appropriate:  IV antibiotics.   Procedures:  None.   Consultants:   IR  ID  Antimicrobials:   Anti-infectives (From admission, onward)    Start     Dose/Rate Route Frequency Ordered Stop   11/11/21 0630  vancomycin (VANCOCIN) IVPB 1000 mg/200 mL premix        1,000 mg 200 mL/hr over 60 Minutes Intravenous Every 12 hours 11/11/21 0328     11/07/21 0500  vancomycin (VANCOREADY) IVPB 1250 mg/250 mL  Status:  Discontinued        1,250 mg 166.7 mL/hr over 90 Minutes Intravenous Every 12 hours 11/06/21 1901 11/11/21 0328   11/04/21 1800  vancomycin (VANCOCIN) IVPB 1000 mg/200 mL premix  Status:  Discontinued        1,000 mg 200 mL/hr over 60 Minutes Intravenous Every 12 hours 11/04/21 0400 11/06/21 1901   11/04/21 0500  vancomycin (VANCOCIN) IVPB 1000 mg/200 mL premix        1,000 mg 200 mL/hr over 60 Minutes Intravenous  Once 11/04/21 0400 11/04/21 0906   11/03/21 1530  ceFEPIme (MAXIPIME) 2 g in sodium chloride 0.9 % 100 mL IVPB  Status:  Discontinued        2 g 200 mL/hr over 30 Minutes Intravenous Every 8 hours 11/03/21 1436 11/06/21 0908   11/03/21 1353  vancomycin variable dose per unstable renal function (pharmacist dosing)  Status:  Discontinued         Does not apply See admin instructions 11/03/21 1353 11/04/21 0400   10/25/21 1600  micafungin (MYCAMINE) 150 mg in sodium chloride 0.9 % 100 mL IVPB  Status:  Discontinued        See Hyperspace for full Linked Orders Report.   150 mg 107.5 mL/hr over 1 Hours Intravenous Every 24 hours 10/24/21 1301 10/24/21 1435   10/24/21 2200  vancomycin (VANCOREADY) IVPB 1250 mg/250 mL  Status:  Discontinued        1,250 mg 166.7 mL/hr over 90 Minutes Intravenous Every 12 hours 10/24/21 1435 11/03/21 1353   10/24/21 1600  fluconazole (DIFLUCAN) tablet 800 mg  Status:  Discontinued        800 mg Oral  Once 10/24/21 1300 10/24/21 1435   10/24/21 1600  micafungin (MYCAMINE) 150 mg in sodium chloride 0.9 % 100 mL IVPB  See Hyperspace for full Linked Orders Report.   150 mg 107.5 mL/hr over 1 Hours Intravenous Every 24 hours 10/24/21 1435     10/24/21 1100  linezolid (ZYVOX) tablet 600 mg  Status:  Discontinued        600 mg Oral Every 12 hours 10/24/21 0950 10/24/21 1509   10/19/21 2200  vancomycin (VANCOREADY) IVPB 1250 mg/250 mL  Status:  Discontinued        1,250 mg 166.7 mL/hr over 90 Minutes Intravenous Every 12 hours 10/19/21 1341 10/24/21 0950   10/18/21 2200  linezolid (ZYVOX) tablet 600 mg  Status:  Discontinued        600 mg Oral Every 12 hours 10/18/21 1600 10/19/21 1341   10/18/21 1700  fluconazole (DIFLUCAN) tablet 800 mg        800 mg Oral  Once 10/18/21 1600 10/18/21 1657   10/17/21 2200  vancomycin (VANCOREADY) IVPB 1250 mg/250 mL  Status:  Discontinued        1,250 mg 166.7 mL/hr over 90 Minutes Intravenous Every 12 hours 10/17/21 1144 10/18/21 1600   10/17/21 1600  micafungin (MYCAMINE) 150 mg in sodium chloride 0.9 % 100 mL IVPB  Status:  Discontinued       See Hyperspace for full Linked Orders Report.   150 mg 107.5 mL/hr over 1 Hours Intravenous Every 24 hours 10/14/21 1115 10/24/21 1301   10/14/21 1600  micafungin (MYCAMINE) 150 mg in sodium chloride 0.9 % 100 mL IVPB       See Hyperspace for full Linked Orders Report.   150 mg 107.5 mL/hr over 1 Hours Intravenous Every 24 hours 10/14/21 1115 10/16/21 1746   10/13/21 2200  vancomycin (VANCOREADY)  IVPB 1500 mg/300 mL  Status:  Discontinued        1,500 mg 150 mL/hr over 120 Minutes Intravenous Every 12 hours 10/13/21 1321 10/17/21 1144   10/12/21 1700  micafungin (MYCAMINE) 150 mg in sodium chloride 0.9 % 100 mL IVPB  Status:  Discontinued        150 mg 115 mL/hr over 1 Hours Intravenous Every 24 hours 10/12/21 1601 10/14/21 1115   10/11/21 2200  vancomycin (VANCOREADY) IVPB 1250 mg/250 mL  Status:  Discontinued        1,250 mg 166.7 mL/hr over 90 Minutes Intravenous Every 12 hours 10/11/21 0903 10/13/21 1321   10/11/21 1000  linezolid (ZYVOX) tablet 600 mg  Status:  Discontinued        600 mg Oral 2 times daily 10/11/21 0149 10/11/21 0813   10/11/21 1000  ceFEPIme (MAXIPIME) 2 g in sodium chloride 0.9 % 100 mL IVPB  Status:  Discontinued        2 g 200 mL/hr over 30 Minutes Intravenous Every 8 hours 10/11/21 0901 10/16/21 0938   10/11/21 1000  vancomycin (VANCOREADY) IVPB 1750 mg/350 mL        1,750 mg 175 mL/hr over 120 Minutes Intravenous  Once 10/11/21 0901 10/11/21 1242        Medications  Scheduled Meds:  amLODipine  2.5 mg Oral Daily   Chlorhexidine Gluconate Cloth  6 each Topical Daily   dabigatran  150 mg Oral Q12H   feeding supplement  1 Container Oral BID BM   ferrous gluconate  324 mg Oral Q breakfast   gabapentin  400 mg Oral TID   melatonin  3 mg Oral QHS   oxyCODONE  10 mg Oral Q12H   pantoprazole  40 mg Oral Daily   polyethylene glycol  17 g Oral Daily   senna-docusate  1 tablet Oral QHS   sodium chloride flush  10-40 mL Intracatheter Q12H   Continuous Infusions:  sodium chloride 10 mL/hr at 11/18/21 0200   micafungin (MYCAMINE) 150 mg in sodium chloride 0.9 % 100 mL IVPB 150 mg (11/18/21 1551)   vancomycin 1,000 mg (11/19/21 1057)   PRN Meds:.sodium chloride, acetaminophen **OR** acetaminophen, benzonatate, chlorpheniramine-HYDROcodone, docusate sodium, guaiFENesin-dextromethorphan, hydrALAZINE, menthol-cetylpyridinium, ondansetron **OR** ondansetron  (ZOFRAN) IV, oxyCODONE-acetaminophen **OR** [DISCONTINUED]  morphine injection, polyethylene glycol, prochlorperazine, sodium chloride flush    Subjective:   Latoya [name] was seen and examined today. No new complaints today.   Objective:   Vitals:   11/18/21 1549 11/18/21 1900 11/19/21 0500 11/19/21 0958  BP: 117/84 (!) 131/109 (!) 154/94 (!) 131/105  Pulse:      Resp:  17 16   Temp:  97.9 F (36.6 C) 98.1 F (36.7 C)   TempSrc:  Oral Axillary   SpO2:      Weight:   88.4 kg   Height:        Intake/Output Summary (Last 24 hours) at 11/19/2021 1602 Last data filed at 11/19/2021 0300 Gross per 24 hour  Intake 877.5 ml  Output --  Net 877.5 ml    Filed Weights   11/17/21 0355 11/18/21 0500 11/19/21 0500  Weight: 89.1 kg 89 kg 88.4 kg     Exam General exam: Appears calm and comfortable  Respiratory system: Clear to auscultation. Respiratory effort normal. Cardiovascular system: S1 & S2 heard, RRR. No JVD, . No pedal edema. Gastrointestinal system: Abdomen is nondistended, soft and nontender. Normal bowel sounds heard. Central nervous system: Alert and oriented. No focal neurological deficits. Extremities: Symmetric 5 x 5 power. Skin: No rashes, lesions or ulcers Psychiatry: Mood & affect appropriate.          Data Reviewed:  I have personally reviewed following labs and imaging studies   CBC Lab Results  Component Value Date   WBC 8.1 11/17/2021   RBC 4.07 11/17/2021   HGB 10.3 (L) 11/17/2021   HCT 36.5 11/17/2021   MCV 89.7 11/17/2021   MCH 25.3 (L) 11/17/2021   PLT 230 11/17/2021   MCHC 28.2 (L) 11/17/2021   RDW 27.6 (H) 11/17/2021   LYMPHSABS 3.7 10/29/2021   MONOABS 1.3 (H) 10/29/2021   EOSABS 0.9 (H) 10/29/2021   BASOSABS 0.0 10/29/2021     Last metabolic panel Lab Results  Component Value Date   NA 138 11/16/2021   K 4.3 11/16/2021   CL 105 11/16/2021   CO2 26 11/16/2021   BUN 6 11/16/2021   CREATININE 0.69 11/16/2021   GLUCOSE 86  11/16/2021   GFRNONAA >60 11/16/2021   GFRAA >60 08/07/2019   CALCIUM 8.9 11/16/2021   PHOS 3.6 10/25/2021   PROT 7.5 10/25/2021   ALBUMIN 2.1 (L) 10/25/2021   BILITOT 0.2 (L) 10/25/2021   ALKPHOS 57 10/25/2021   AST 13 (L) 10/25/2021   ALT 7 10/25/2021   ANIONGAP 7 11/16/2021    CBG (last 3)  No results for input(s): "GLUCAP" in the last 72 hours.    Coagulation Profile: No results for input(s): "INR", "PROTIME" in the last 168 hours.   Radiology Studies: No results found.     Kathlen Mody M.D. [facility] 11/19/2021, 4:02 PM  Available via Epic secure chat 7am-7pm After 7 pm, please refer to night coverage provider listed on amion.

## 2021-11-19 NOTE — Progress Notes (Signed)
Pt told multiple times that CHG bath will be given told. Pt has continuously refused.

## 2021-11-20 DIAGNOSIS — M4646 Discitis, unspecified, lumbar region: Secondary | ICD-10-CM | POA: Diagnosis not present

## 2021-11-20 DIAGNOSIS — G8929 Other chronic pain: Secondary | ICD-10-CM | POA: Diagnosis not present

## 2021-11-20 DIAGNOSIS — I2692 Saddle embolus of pulmonary artery without acute cor pulmonale: Secondary | ICD-10-CM | POA: Diagnosis not present

## 2021-11-20 DIAGNOSIS — F1910 Other psychoactive substance abuse, uncomplicated: Secondary | ICD-10-CM | POA: Diagnosis not present

## 2021-11-20 LAB — BASIC METABOLIC PANEL
Anion gap: 8 (ref 5–15)
BUN: 8 mg/dL (ref 6–20)
CO2: 21 mmol/L — ABNORMAL LOW (ref 22–32)
Calcium: 8.4 mg/dL — ABNORMAL LOW (ref 8.9–10.3)
Chloride: 106 mmol/L (ref 98–111)
Creatinine, Ser: 0.56 mg/dL (ref 0.44–1.00)
GFR, Estimated: >60 mL/min (ref >60)
Glucose, Bld: 95 mg/dL (ref 70–99)
Potassium: 3.9 mmol/L (ref 3.5–5.1)
Sodium: 135 mmol/L (ref 135–145)

## 2021-11-20 LAB — CBC
HCT: 39.1 % (ref 36.0–46.0)
Hemoglobin: 11.1 g/dL — ABNORMAL LOW (ref 12.0–15.0)
MCH: 25.6 pg — ABNORMAL LOW (ref 26.0–34.0)
MCHC: 28.4 g/dL — ABNORMAL LOW (ref 30.0–36.0)
MCV: 90.1 fL (ref 80.0–100.0)
Platelets: 273 10*3/uL (ref 150–400)
RBC: 4.34 MIL/uL (ref 3.87–5.11)
RDW: 25.4 % — ABNORMAL HIGH (ref 11.5–15.5)
WBC: 9 10*3/uL (ref 4.0–10.5)
nRBC: 0 % (ref 0.0–0.2)

## 2021-11-20 LAB — VANCOMYCIN, PEAK: Vancomycin Pk: 39 ug/mL (ref 30–40)

## 2021-11-20 LAB — VANCOMYCIN, TROUGH: Vancomycin Tr: 13 ug/mL — ABNORMAL LOW (ref 15–20)

## 2021-11-20 MED ORDER — VANCOMYCIN HCL 750 MG/150ML IV SOLN
750.0000 mg | Freq: Two times a day (BID) | INTRAVENOUS | Status: AC
  Administered 2021-11-20 – 2021-11-25 (×11): 750 mg via INTRAVENOUS
  Filled 2021-11-20 (×11): qty 150

## 2021-11-20 NOTE — Progress Notes (Signed)
Pharmacy Antibiotic Note Follow up  Latoya [name] is a 36 y.o. female admitted on 10/11/2021 with acute BL PE and TV endocarditis. Patient with history of MSSA and MRSA bacteremia c/b endocarditis and lumbar discitis. Pt recently completed 6 weeks of vancomycin and was started on linezolid after. Pharmacy consulted on 11/03/21 for vancomycin dosing. Micafungin ordered per ID as well with C. Albicans growing in blood. End date of IV antibiotics 11/25/21 per ID.   Vancomycin levels demonstrate supra-therapeutic AUC. Scr stable at baseline.  Plan: Vancomycin 750 IV q12 (eAUC 460)   Height: 5\' 6"  (167.6 cm) Weight: 83.3 kg (183 lb 11.2 oz) IBW/kg (Calculated) : 59.3  Temp (24hrs), Avg:98.1 F (36.7 C), Min:98 F (36.7 C), Max:98.1 F (36.7 C)  Recent Labs  Lab 11/14/21 0559 11/15/21 0500 11/15/21 1155 11/16/21 0017 11/16/21 0930 11/17/21 0500 11/20/21 0002 11/20/21 1010  WBC 10.1  --   --   --   --  8.1 9.0  --   CREATININE  --  0.69  --  0.69  --   --  0.56  --   VANCOTROUGH  --   --   --   --  12*  --   --  13*  VANCOPEAK  --   --    < > 31  --   --  39  --    < > = values in this interval not displayed.     Estimated Creatinine Clearance: 107.8 mL/min (by C-G formula based on SCr of 0.56 mg/dL).    Allergies  Allergen Reactions   Naltrexone Anxiety    Severe anxiety Restlessness  Vomiting     Antimicrobials this admission: Vancomycin 6/7 >>  Cefepime 6/7 >> 6/12; 6/30 >> 7/3 Micafungin 6/8 >>  Vancomycin levels 6/23 Vanc levels- AUC 510  6/30 VP > 60 - possibly contaminated by drug - decr'd to 1g q12 for safety          VR 18/ VT 11- AUC 409  7/3 VP 28/VT12 AUC 369> incr 1250 q12  7/7 VT =11 on vanc 1250 mg q12h,  continue at same dose.  7/8 extrapolated AUC 620, will reduce to 1000mg  Q12 hours 7/13 AUC 559. Continue at same dose.  7/14 AUC 613, will reduce dose to 750mg  q12hr    Microbiology results: 6/7 BCx: C. Albicans 6/7 MRSA PCR: negative 6/9 resp-  normal flora 6/10 blood x2- neg  8/7, PharmD, [facility] PGY1 Pharmacy Resident 11/20/2021 11:38 AM

## 2021-11-20 NOTE — Progress Notes (Signed)
[facility]                                                                               Latoya [name], is a 36 y.o. female, DOB - Jul 14, 1985, YQM:[id number] Admit date - 10/11/2021    Outpatient Primary MD for the patient is Pcp, No  LOS - 40  days    Brief summary   36 year old female past medical history of intravenous drug abuse (now reports abstinence),  MSSA tricuspid valve endocarditis in 2020 (Treated with angiovac and course of Cefazolin), MRSA L4-L5 facet join septic discitis in 07/2019, MRSA bacteremia with infective endocarditis and tricuspid valve vegetation diagnosed 07/24/21 who presented as a transfer from [facility] for saddle pulmonary embolism.  Blood cultures drawn on 10/11/2021 have grown Candida albicans, with negative repeat culture on 10/14/2021.  MRI L-spine done on 10/07/2021 with findings concerning for septic arthritis.  She is currently on micafungin and IV vancomycin, with infectious disease guidance, tentative end date 11/25/2021.   Cardiothoracic surgery was consulted, noting evidence of PFO at previous Angiovac attempt, stating only option would be for surgical valve repair and PFO closure. They recommended outpatient follow up after sustained abstinence of illicit drugs.  Plan to continue with IV vancomycin and Micafungin till 11/25/2021. ID recommends oral fluconazole due to concern for candida endocarditis for possible 6 to 12 months.  Will Request Dr Cliffton Asters to talk to the patient regarding the timing of the valve repair.  Patient seen and examined, no events overnight, no chest pain or sob.      Assessment & Plan    Assessment and Plan:   Acute saddle pulmonary embolism (HCC) -Failure to DOACs, developed while on eliquis. Echo does not show any RV strain.  - she was started on Pradaxa after speaking with Dr Myna Hidalgo.  - occasional cough, CXR repeated shows resolving pneumonia.  Recommended to ambulate and get oob.    Sepsis,  multifactorial, POA with candidemia, endocarditis, discitis IVDU history, reports abstinence since March 2023 Counseled on cessation -Complete course of IV antibiotics 11/25/2021 as recommended by infectious disease. - she will be on anti fungal treatment probably fluconazole for possibly 6 to 12 months.  - discussed the plan with the patient today in detail.    Pneumonia left lower lobe HCAP versus septic emboli on prior admission Continue with IV antibiotics and Pradaxa.  Repeat CXR shows improvement. Symptomatic relief from cough with robitussin and hydrocodone cough syrup, tessalon.  Recommend to continue with IS and flutter valve.    Candidemia, tricuspid valve endocarditis, MRSA bacteremia, POA -Large vegetation. CT VS consulted, recommended follow-up after discharge for consideration of surgical valve repair and PFO closure after sustained abstinence of illicit drugs  -LUE PICC placed by IR 6/15, replaced 6/24  then on 7/7 due to kinking.  Blood cultures have been negative so far.  -ID onboard and recommend to continue with IV vancomycin and Micafungin. Plan for fluconazole for another 6  to 12 months after micafungin.  Leukocytosis resolved.    Lumbar discitis Pain control and IV antibiotics.    Volume overload, pulmonary edema  Appears to have resolved. She is weaned off oxygen.  Currently  off diuretics.     Resolved hypokalemia, hypomagnesemia Repeat levels wnl.  Potassium is 4.3 this am.   Resolved mild hyponatremia Encourage adequate oral intake.   Chronic normocytic anemia Hemoglobin stable around 10.    Resolved thrombocytopenia   Chronic pain, pleuritic chest pain -Continue Neurontin 400 mg 3 times daily   Obesity Estimated body mass index is 32.31 kg/m as calculated from the following:   Height as of this encounter: 5\' 6"  (1.676 m).   Weight as of this encounter: 90.8 kg. Recommend weight loss outpatient with regular physical activity and healthy  dieting.    Hypertension:  BP parameters are optimal, continue with Norvasc and hydralazine prn.     GERD:  On PPI.    Encourage oob and therapy evaluations.    Estimated body mass index is 29.65 kg/m as calculated from the following:   Height as of this encounter: 5\' 6"  (1.676 m).   Weight as of this encounter: 83.3 kg.  Code Status: full code.  DVT Prophylaxis:   dabigatran (PRADAXA) capsule 150 mg   Level of Care: Level of care: Med-Surg Family Communication: none at bedside. Discussed the plan with patient's mom. Mom concerned about recurrent infections after discharge.   Disposition Plan:     Remains inpatient appropriate:  IV antibiotics.   Procedures:  None.   Consultants:   IR  ID  Antimicrobials:   Anti-infectives (From admission, onward)    Start     Dose/Rate Route Frequency Ordered Stop   11/20/21 2200  vancomycin (VANCOREADY) IVPB 750 mg/150 mL        750 mg 150 mL/hr over 60 Minutes Intravenous Every 12 hours 11/20/21 1136     11/11/21 0630  vancomycin (VANCOCIN) IVPB 1000 mg/200 mL premix  Status:  Discontinued        1,000 mg 200 mL/hr over 60 Minutes Intravenous Every 12 hours 11/11/21 0328 11/20/21 1136   11/07/21 0500  vancomycin (VANCOREADY) IVPB 1250 mg/250 mL  Status:  Discontinued        1,250 mg 166.7 mL/hr over 90 Minutes Intravenous Every 12 hours 11/06/21 1901 11/11/21 0328   11/04/21 1800  vancomycin (VANCOCIN) IVPB 1000 mg/200 mL premix  Status:  Discontinued        1,000 mg 200 mL/hr over 60 Minutes Intravenous Every 12 hours 11/04/21 0400 11/06/21 1901   11/04/21 0500  vancomycin (VANCOCIN) IVPB 1000 mg/200 mL premix        1,000 mg 200 mL/hr over 60 Minutes Intravenous  Once 11/04/21 0400 11/04/21 0906   11/03/21 1530  ceFEPIme (MAXIPIME) 2 g in sodium chloride 0.9 % 100 mL IVPB  Status:  Discontinued        2 g 200 mL/hr over 30 Minutes Intravenous Every 8 hours 11/03/21 1436 11/06/21 0908   11/03/21 1353  vancomycin variable  dose per unstable renal function (pharmacist dosing)  Status:  Discontinued         Does not apply See admin instructions 11/03/21 1353 11/04/21 0400   10/25/21 1600  micafungin (MYCAMINE) 150 mg in sodium chloride 0.9 % 100 mL IVPB  Status:  Discontinued       See Hyperspace for full Linked Orders Report.   150 mg 107.5 mL/hr over 1 Hours Intravenous Every 24 hours 10/24/21 1301 10/24/21 1435   10/24/21 2200  vancomycin (VANCOREADY) IVPB 1250 mg/250 mL  Status:  Discontinued        1,250 mg 166.7 mL/hr over 90 Minutes Intravenous Every 12  hours 10/24/21 1435 11/03/21 1353   10/24/21 1600  fluconazole (DIFLUCAN) tablet 800 mg  Status:  Discontinued        800 mg Oral  Once 10/24/21 1300 10/24/21 1435   10/24/21 1600  micafungin (MYCAMINE) 150 mg in sodium chloride 0.9 % 100 mL IVPB       See Hyperspace for full Linked Orders Report.   150 mg 107.5 mL/hr over 1 Hours Intravenous Every 24 hours 10/24/21 1435     10/24/21 1100  linezolid (ZYVOX) tablet 600 mg  Status:  Discontinued        600 mg Oral Every 12 hours 10/24/21 0950 10/24/21 1509   10/19/21 2200  vancomycin (VANCOREADY) IVPB 1250 mg/250 mL  Status:  Discontinued        1,250 mg 166.7 mL/hr over 90 Minutes Intravenous Every 12 hours 10/19/21 1341 10/24/21 0950   10/18/21 2200  linezolid (ZYVOX) tablet 600 mg  Status:  Discontinued        600 mg Oral Every 12 hours 10/18/21 1600 10/19/21 1341   10/18/21 1700  fluconazole (DIFLUCAN) tablet 800 mg        800 mg Oral  Once 10/18/21 1600 10/18/21 1657   10/17/21 2200  vancomycin (VANCOREADY) IVPB 1250 mg/250 mL  Status:  Discontinued        1,250 mg 166.7 mL/hr over 90 Minutes Intravenous Every 12 hours 10/17/21 1144 10/18/21 1600   10/17/21 1600  micafungin (MYCAMINE) 150 mg in sodium chloride 0.9 % 100 mL IVPB  Status:  Discontinued       See Hyperspace for full Linked Orders Report.   150 mg 107.5 mL/hr over 1 Hours Intravenous Every 24 hours 10/14/21 1115 10/24/21 1301    10/14/21 1600  micafungin (MYCAMINE) 150 mg in sodium chloride 0.9 % 100 mL IVPB       See Hyperspace for full Linked Orders Report.   150 mg 107.5 mL/hr over 1 Hours Intravenous Every 24 hours 10/14/21 1115 10/16/21 1746   10/13/21 2200  vancomycin (VANCOREADY) IVPB 1500 mg/300 mL  Status:  Discontinued        1,500 mg 150 mL/hr over 120 Minutes Intravenous Every 12 hours 10/13/21 1321 10/17/21 1144   10/12/21 1700  micafungin (MYCAMINE) 150 mg in sodium chloride 0.9 % 100 mL IVPB  Status:  Discontinued        150 mg 115 mL/hr over 1 Hours Intravenous Every 24 hours 10/12/21 1601 10/14/21 1115   10/11/21 2200  vancomycin (VANCOREADY) IVPB 1250 mg/250 mL  Status:  Discontinued        1,250 mg 166.7 mL/hr over 90 Minutes Intravenous Every 12 hours 10/11/21 0903 10/13/21 1321   10/11/21 1000  linezolid (ZYVOX) tablet 600 mg  Status:  Discontinued        600 mg Oral 2 times daily 10/11/21 0149 10/11/21 0813   10/11/21 1000  ceFEPIme (MAXIPIME) 2 g in sodium chloride 0.9 % 100 mL IVPB  Status:  Discontinued        2 g 200 mL/hr over 30 Minutes Intravenous Every 8 hours 10/11/21 0901 10/16/21 0938   10/11/21 1000  vancomycin (VANCOREADY) IVPB 1750 mg/350 mL        1,750 mg 175 mL/hr over 120 Minutes Intravenous  Once 10/11/21 0901 10/11/21 1242        Medications  Scheduled Meds:  amLODipine  5 mg Oral Daily   Chlorhexidine Gluconate Cloth  6 each Topical Daily   dabigatran  150 mg  Oral Q12H   feeding supplement  1 Container Oral BID BM   ferrous gluconate  324 mg Oral Q breakfast   gabapentin  400 mg Oral TID   melatonin  3 mg Oral QHS   oxyCODONE  10 mg Oral Q12H   pantoprazole  40 mg Oral Daily   polyethylene glycol  17 g Oral Daily   senna-docusate  1 tablet Oral QHS   sodium chloride flush  10-40 mL Intracatheter Q12H   Continuous Infusions:  sodium chloride 10 mL/hr at 11/18/21 0200   micafungin (MYCAMINE) 150 mg in sodium chloride 0.9 % 100 mL IVPB 150 mg (11/19/21 1649)    vancomycin     PRN Meds:.sodium chloride, acetaminophen **OR** acetaminophen, benzonatate, chlorpheniramine-HYDROcodone, docusate sodium, guaiFENesin-dextromethorphan, hydrALAZINE, menthol-cetylpyridinium, ondansetron **OR** ondansetron (ZOFRAN) IV, oxyCODONE-acetaminophen **OR** [DISCONTINUED]  morphine injection, polyethylene glycol, prochlorperazine, sodium chloride flush    Subjective:   Latoya [name] was seen and examined today. No new complaints overnight.   Objective:   Vitals:   11/19/21 0500 11/19/21 0958 11/19/21 1854 11/20/21 0407  BP: (!) 154/94 (!) 131/105 (!) 129/105 (!) 137/96  Pulse:   90 97  Resp: 16  16 18   Temp: 98.1 F (36.7 C)  98 F (36.7 C) 98.1 F (36.7 C)  TempSrc: Axillary  Oral Oral  SpO2:   98% 98%  Weight: 88.4 kg   83.3 kg  Height:        Intake/Output Summary (Last 24 hours) at 11/20/2021 1225 Last data filed at 11/20/2021 0001 Gross per 24 hour  Intake 560 ml  Output --  Net 560 ml    Filed Weights   11/18/21 0500 11/19/21 0500 11/20/21 0407  Weight: 89 kg 88.4 kg 83.3 kg     Exam General exam: Appears calm and comfortable  Respiratory system: Clear to auscultation. Respiratory effort normal. Cardiovascular system: S1 & S2 heard, RRR. No JVD,  No pedal edema. Gastrointestinal system: Abdomen is nondistended, soft and nontender.  Normal bowel sounds heard. Central nervous system: Alert and oriented. No focal neurological deficits. Extremities: Symmetric 5 x 5 power. Skin: No rashes, lesions or ulcers Psychiatry:  Mood & affect appropriate.           Data Reviewed:  I have personally reviewed following labs and imaging studies   CBC Lab Results  Component Value Date   WBC 9.0 11/20/2021   RBC 4.34 11/20/2021   HGB 11.1 (L) 11/20/2021   HCT 39.1 11/20/2021   MCV 90.1 11/20/2021   MCH 25.6 (L) 11/20/2021   PLT 273 11/20/2021   MCHC 28.4 (L) 11/20/2021   RDW 25.4 (H) 11/20/2021   LYMPHSABS 3.7 10/29/2021   MONOABS  1.3 (H) 10/29/2021   EOSABS 0.9 (H) 10/29/2021   BASOSABS 0.0 10/29/2021     Last metabolic panel Lab Results  Component Value Date   NA 135 11/20/2021   K 3.9 11/20/2021   CL 106 11/20/2021   CO2 21 (L) 11/20/2021   BUN 8 11/20/2021   CREATININE 0.56 11/20/2021   GLUCOSE 95 11/20/2021   GFRNONAA >60 11/20/2021   GFRAA >60 08/07/2019   CALCIUM 8.4 (L) 11/20/2021   PHOS 3.6 10/25/2021   PROT 7.5 10/25/2021   ALBUMIN 2.1 (L) 10/25/2021   BILITOT 0.2 (L) 10/25/2021   ALKPHOS 57 10/25/2021   AST 13 (L) 10/25/2021   ALT 7 10/25/2021   ANIONGAP 8 11/20/2021    CBG (last 3)  No results for input(s): "GLUCAP" in the last 72 hours.  Coagulation Profile: No results for input(s): "INR", "PROTIME" in the last 168 hours.   Radiology Studies: No results found.     Kathlen Mody M.D. [facility] 11/20/2021, 12:25 PM  Available via Epic secure chat 7am-7pm After 7 pm, please refer to night coverage provider listed on amion.

## 2021-11-21 DIAGNOSIS — I2692 Saddle embolus of pulmonary artery without acute cor pulmonale: Secondary | ICD-10-CM | POA: Diagnosis not present

## 2021-11-21 DIAGNOSIS — M4646 Discitis, unspecified, lumbar region: Secondary | ICD-10-CM | POA: Diagnosis not present

## 2021-11-21 DIAGNOSIS — G8929 Other chronic pain: Secondary | ICD-10-CM | POA: Diagnosis not present

## 2021-11-21 NOTE — Progress Notes (Signed)
Unable to flush 2nd port today. Appears to be positional once again. Emelda Brothers RN

## 2021-11-21 NOTE — Progress Notes (Signed)
[facility]                                                                               Latoya [name], is a 36 y.o. female, DOB - [birth year], [id number]:[id number] Admit date - 10/11/2021    Outpatient Primary MD for the patient is Pcp, No  LOS - 41  days    Brief summary   36 year old female past medical history of intravenous drug abuse (now reports abstinence),  MSSA tricuspid valve endocarditis in 2020 (Treated with angiovac and course of Cefazolin), MRSA L4-L5 facet join septic discitis in 07/2019, MRSA bacteremia with infective endocarditis and tricuspid valve vegetation diagnosed 07/24/21 who [facility] for saddle pulmonary embolism.  Blood cultures drawn on 10/11/2021 have grown Candida albicans, with negative repeat culture on 10/14/2021.  MRI L-spine done on 10/07/2021 with findings concerning for septic arthritis.  She is currently on micafungin and IV vancomycin, with infectious disease guidance, tentative end date 11/25/2021.   Cardiothoracic surgery was consulted, noting evidence of PFO at previous Angiovac attempt, stating only option would be for surgical valve repair and PFO closure. They recommended outpatient follow up after sustained abstinence of illicit drugs.  Plan to continue with IV vancomycin and Micafungin till 11/25/2021. ID recommends oral fluconazole due to concern for candida endocarditis for possible 6 to 12 months.  Will Request Dr Cliffton AstersLightfoot to talk to the patient regarding the timing of the valve repair.  Patient seen and examined and all her questions answered at bedside today.  She reports feeling better, cough is better.      Assessment & Plan    Assessment and Plan:   Acute saddle pulmonary embolism (HCC) -Failure to DOACs, developed while on eliquis. Echo does not show any RV strain.  - she was started on Pradaxa after speaking with Dr Myna HidalgoEnnever.  - occasional cough, CXR repeated shows resolving pneumonia.   Recommended to ambulate and get oob. She remains on RA with good oxygen sats.    Sepsis, multifactorial, POA with candidemia, endocarditis, discitis IVDU history, reports abstinence since March 2023 Counseled on cessation -Complete course of IV antibiotics 11/25/2021 as recommended by infectious disease. - she will be on anti fungal treatment probably fluconazole for possibly 6 to 12 months.  - discussed the plan  with the patient and mom on the phone in detail.    Pneumonia left lower lobe HCAP versus septic emboli on prior admission Continue with IV antibiotics and Pradaxa.  Repeat CXR shows improvement. Symptomatic relief from cough with robitussin and hydrocodone cough syrup, tessalon.  Recommend to continue with IS and flutter valve.    Candidemia, tricuspid valve endocarditis, MRSA bacteremia, POA -Large vegetation. CT VS consulted, recommended follow-up after discharge for consideration of surgical valve repair and PFO closure after sustained abstinence of illicit drugs  -LUE PICC placed by IR 6/15, replaced 6/24  then on 7/7 due to kinking.  Blood cultures have been negative so far.  -ID onboard and recommend to continue with IV vancomycin and Micafungin. Plan for fluconazole for another 6  to 12 months after micafungin.  Leukocytosis resolved.    Lumbar discitis Pain control and  IV antibiotics.    Volume overload, pulmonary edema  Appears to have resolved. She is weaned off oxygen.  Currently off diuretics.     Resolved hypokalemia, hypomagnesemia Repeat levels wnl.    Resolved mild hyponatremia Encourage adequate oral intake.   Chronic normocytic anemia Hemoglobin improved to 11.1   Resolved thrombocytopenia   Chronic pain, pleuritic chest pain -Continue Neurontin 400 mg 3 times daily   Obesity Estimated body mass index is 32.31 kg/m as calculated from the following:   Height as of this encounter: 5\' 6"  (1.676 m).   Weight as of this encounter: 90.8  kg. Recommend weight loss outpatient with regular physical activity and healthy dieting.    Hypertension:  BP parameters are optimal, continue with Norvasc and hydralazine prn.     GERD:  On PPI.    Encourage oob and therapy evaluations.    Estimated body mass index is 29.65 kg/m as calculated from the following:   Height as of this encounter: 5\' 6"  (1.676 m).   Weight as of this encounter: 83.3 kg.  Code Status: full code.  DVT Prophylaxis:   dabigatran (PRADAXA) capsule 150 mg   Level of Care: Level of care: Med-Surg Family Communication: none at bedside. Discussed the plan with patient's mom. Mom concerned about recurrent infections after discharge.   Disposition Plan:     Remains inpatient appropriate:  IV antibiotics.   Procedures:  None.   Consultants:   IR  ID  Antimicrobials:   Anti-infectives (From admission, onward)    Start     Dose/Rate Route Frequency Ordered Stop   11/20/21 2200  vancomycin (VANCOREADY) IVPB 750 mg/150 mL        750 mg 150 mL/hr over 60 Minutes Intravenous Every 12 hours 11/20/21 1136     11/11/21 0630  vancomycin (VANCOCIN) IVPB 1000 mg/200 mL premix  Status:  Discontinued        1,000 mg 200 mL/hr over 60 Minutes Intravenous Every 12 hours 11/11/21 0328 11/20/21 1136   11/07/21 0500  vancomycin (VANCOREADY) IVPB 1250 mg/250 mL  Status:  Discontinued        1,250 mg 166.7 mL/hr over 90 Minutes Intravenous Every 12 hours 11/06/21 1901 11/11/21 0328   11/04/21 1800  vancomycin (VANCOCIN) IVPB 1000 mg/200 mL premix  Status:  Discontinued        1,000 mg 200 mL/hr over 60 Minutes Intravenous Every 12 hours 11/04/21 0400 11/06/21 1901   11/04/21 0500  vancomycin (VANCOCIN) IVPB 1000 mg/200 mL premix        1,000 mg 200 mL/hr over 60 Minutes Intravenous  Once 11/04/21 0400 11/04/21 0906   11/03/21 1530  ceFEPIme (MAXIPIME) 2 g in sodium chloride 0.9 % 100 mL IVPB  Status:  Discontinued        2 g 200 mL/hr over 30 Minutes  Intravenous Every 8 hours 11/03/21 1436 11/06/21 0908   11/03/21 1353  vancomycin variable dose per unstable renal function (pharmacist dosing)  Status:  Discontinued         Does not apply See admin instructions 11/03/21 1353 11/04/21 0400   10/25/21 1600  micafungin (MYCAMINE) 150 mg in sodium chloride 0.9 % 100 mL IVPB  Status:  Discontinued       See Hyperspace for full Linked Orders Report.   150 mg 107.5 mL/hr over 1 Hours Intravenous Every 24 hours 10/24/21 1301 10/24/21 1435   10/24/21 2200  vancomycin (VANCOREADY) IVPB 1250 mg/250 mL  Status:  Discontinued  1,250 mg 166.7 mL/hr over 90 Minutes Intravenous Every 12 hours 10/24/21 1435 11/03/21 1353   10/24/21 1600  fluconazole (DIFLUCAN) tablet 800 mg  Status:  Discontinued        800 mg Oral  Once 10/24/21 1300 10/24/21 1435   10/24/21 1600  micafungin (MYCAMINE) 150 mg in sodium chloride 0.9 % 100 mL IVPB       See Hyperspace for full Linked Orders Report.   150 mg 107.5 mL/hr over 1 Hours Intravenous Every 24 hours 10/24/21 1435     10/24/21 1100  linezolid (ZYVOX) tablet 600 mg  Status:  Discontinued        600 mg Oral Every 12 hours 10/24/21 0950 10/24/21 1509   10/19/21 2200  vancomycin (VANCOREADY) IVPB 1250 mg/250 mL  Status:  Discontinued        1,250 mg 166.7 mL/hr over 90 Minutes Intravenous Every 12 hours 10/19/21 1341 10/24/21 0950   10/18/21 2200  linezolid (ZYVOX) tablet 600 mg  Status:  Discontinued        600 mg Oral Every 12 hours 10/18/21 1600 10/19/21 1341   10/18/21 1700  fluconazole (DIFLUCAN) tablet 800 mg        800 mg Oral  Once 10/18/21 1600 10/18/21 1657   10/17/21 2200  vancomycin (VANCOREADY) IVPB 1250 mg/250 mL  Status:  Discontinued        1,250 mg 166.7 mL/hr over 90 Minutes Intravenous Every 12 hours 10/17/21 1144 10/18/21 1600   10/17/21 1600  micafungin (MYCAMINE) 150 mg in sodium chloride 0.9 % 100 mL IVPB  Status:  Discontinued       See Hyperspace for full Linked Orders Report.   150  mg 107.5 mL/hr over 1 Hours Intravenous Every 24 hours 10/14/21 1115 10/24/21 1301   10/14/21 1600  micafungin (MYCAMINE) 150 mg in sodium chloride 0.9 % 100 mL IVPB       See Hyperspace for full Linked Orders Report.   150 mg 107.5 mL/hr over 1 Hours Intravenous Every 24 hours 10/14/21 1115 10/16/21 1746   10/13/21 2200  vancomycin (VANCOREADY) IVPB 1500 mg/300 mL  Status:  Discontinued        1,500 mg 150 mL/hr over 120 Minutes Intravenous Every 12 hours 10/13/21 1321 10/17/21 1144   10/12/21 1700  micafungin (MYCAMINE) 150 mg in sodium chloride 0.9 % 100 mL IVPB  Status:  Discontinued        150 mg 115 mL/hr over 1 Hours Intravenous Every 24 hours 10/12/21 1601 10/14/21 1115   10/11/21 2200  vancomycin (VANCOREADY) IVPB 1250 mg/250 mL  Status:  Discontinued        1,250 mg 166.7 mL/hr over 90 Minutes Intravenous Every 12 hours 10/11/21 0903 10/13/21 1321   10/11/21 1000  linezolid (ZYVOX) tablet 600 mg  Status:  Discontinued        600 mg Oral 2 times daily 10/11/21 0149 10/11/21 0813   10/11/21 1000  ceFEPIme (MAXIPIME) 2 g in sodium chloride 0.9 % 100 mL IVPB  Status:  Discontinued        2 g 200 mL/hr over 30 Minutes Intravenous Every 8 hours 10/11/21 0901 10/16/21 0938   10/11/21 1000  vancomycin (VANCOREADY) IVPB 1750 mg/350 mL        1,750 mg 175 mL/hr over 120 Minutes Intravenous  Once 10/11/21 0901 10/11/21 1242        Medications  Scheduled Meds:  amLODipine  5 mg Oral Daily   Chlorhexidine Gluconate Cloth  6 each Topical Daily   dabigatran  150 mg Oral Q12H   feeding supplement  1 Container Oral BID BM   ferrous gluconate  324 mg Oral Q breakfast   gabapentin  400 mg Oral TID   melatonin  3 mg Oral QHS   oxyCODONE  10 mg Oral Q12H   pantoprazole  40 mg Oral Daily   polyethylene glycol  17 g Oral Daily   senna-docusate  1 tablet Oral QHS   sodium chloride flush  10-40 mL Intracatheter Q12H   Continuous Infusions:  sodium chloride 10 mL/hr at 11/21/21 0354    micafungin (MYCAMINE) 150 mg in sodium chloride 0.9 % 100 mL IVPB Stopped (11/20/21 1611)   vancomycin 750 mg (11/21/21 0927)   PRN Meds:.sodium chloride, acetaminophen **OR** acetaminophen, benzonatate, chlorpheniramine-HYDROcodone, docusate sodium, guaiFENesin-dextromethorphan, hydrALAZINE, menthol-cetylpyridinium, ondansetron **OR** ondansetron (ZOFRAN) IV, oxyCODONE-acetaminophen **OR** [DISCONTINUED]  morphine injection, polyethylene glycol, prochlorperazine, sodium chloride flush    Subjective:   Chantelle Schlemmer was seen and examined today. No new events overnight.   Objective:   Vitals:   11/20/21 0407 11/20/21 1412 11/20/21 2031 11/21/21 0653  BP: (!) 137/96 (!) 127/59 (!) 139/98 (!) 118/95  Pulse: 97 100 98 89  Resp: 18 18 19 20   Temp: 98.1 F (36.7 C) 98 F (36.7 C) 98 F (36.7 C) 98 F (36.7 C)  TempSrc: Oral Oral Oral Oral  SpO2: 98%  98% (!) 65%  Weight: 83.3 kg     Height:        Intake/Output Summary (Last 24 hours) at 11/21/2021 1237 Last data filed at 11/21/2021 1042 Gross per 24 hour  Intake 1400.17 ml  Output --  Net 1400.17 ml    Filed Weights   11/18/21 0500 11/19/21 0500 11/20/21 0407  Weight: 89 kg 88.4 kg 83.3 kg     Exam General exam: Appears calm and comfortable  Respiratory system: Clear to auscultation. Respiratory effort normal. Cardiovascular system: S1 & S2 heard, RRR. No JVD, No pedal edema. Gastrointestinal system: Abdomen is nondistended, soft and nontender. Normal bowel sounds heard. Central nervous system: Alert and oriented. No focal neurological deficits. Extremities: Symmetric 5 x 5 power. Skin: No rashes, lesions or ulcers Psychiatry: Mood & affect appropriate.            Data Reviewed:  I have personally reviewed following labs and imaging studies   CBC Lab Results  Component Value Date   WBC 9.0 11/20/2021   RBC 4.34 11/20/2021   HGB 11.1 (L) 11/20/2021   HCT 39.1 11/20/2021   MCV 90.1 11/20/2021   MCH  25.6 (L) 11/20/2021   PLT 273 11/20/2021   MCHC 28.4 (L) 11/20/2021   RDW 25.4 (H) 11/20/2021   LYMPHSABS 3.7 10/29/2021   MONOABS 1.3 (H) 10/29/2021   EOSABS 0.9 (H) 10/29/2021   BASOSABS 0.0 10/29/2021     Last metabolic panel Lab Results  Component Value Date   NA 135 11/20/2021   K 3.9 11/20/2021   CL 106 11/20/2021   CO2 21 (L) 11/20/2021   BUN 8 11/20/2021   CREATININE 0.56 11/20/2021   GLUCOSE 95 11/20/2021   GFRNONAA >60 11/20/2021   GFRAA >60 08/07/2019   CALCIUM 8.4 (L) 11/20/2021   PHOS 3.6 10/25/2021   PROT 7.5 10/25/2021   ALBUMIN 2.1 (L) 10/25/2021   BILITOT 0.2 (L) 10/25/2021   ALKPHOS 57 10/25/2021   AST 13 (L) 10/25/2021   ALT 7 10/25/2021   ANIONGAP 8 11/20/2021    CBG (last  3)  No results for input(s): "GLUCAP" in the last 72 hours.    Coagulation Profile: No results for input(s): "INR", "PROTIME" in the last 168 hours.   Radiology Studies: No results found.     Kathlen Mody M.D. [facility] 11/21/2021, 12:37 PM  Available via Epic secure chat 7am-7pm After 7 pm, please refer to night coverage provider listed on amion.

## 2021-11-22 DIAGNOSIS — I2692 Saddle embolus of pulmonary artery without acute cor pulmonale: Secondary | ICD-10-CM | POA: Diagnosis not present

## 2021-11-22 DIAGNOSIS — B377 Candidal sepsis: Secondary | ICD-10-CM | POA: Diagnosis not present

## 2021-11-22 NOTE — Consult Note (Signed)
   [facility], [place] CM Inpatient Consult   11/22/2021  Latoya [name] November 17, 1985 [id number]  Managed Medicaid [MM]: [employer]  LLOS: 42 days, no PCP noted prior to admission  High risk Medicaid review:  Reviewed for MM follow up needs, noted for ongoing IV ABX progress notes reviewed.  Plan:  Follow up for referral to MM team for post hospital management needs.  Charlesetta Shanks, RN BSN CCM [facility]  [phone] business mobile phone Toll free office [phone]  Fax number: [phone] [name]

## 2021-11-22 NOTE — Progress Notes (Signed)
PROGRESS NOTE    Latoya [name]  UUV:[id number] DOB: Mar 16, 1986 DOA: 10/11/2021 PCP: Pcp, No    Brief Narrative:  IV drug user with candidemia, previous history of MSSA endocarditis currently completing IV vancomycin and micafungin.  Remains in the hospital to complete antibiotics.   Assessment & Plan:   Acute saddle pulmonary embolism: Currently on Pradaxa. Sepsis, multifactorial, candidemia and endocarditis, discitis  Antibiotics until 7/22, oral Diflucan on discharge.  Outpatient ID follow-up.  Electrolytes: Resolved.  Chronic pain syndrome, on Neurontin.  Patient is on oxycodone at the scheduled doses in the hospital.  We will try to taper off.  Unable to prescribe on discharge.  Essential hypertension: On Norvasc.  Continue.  DVT prophylaxis:  dabigatran (PRADAXA) capsule 150 mg   Code Status: Full code Family Communication: None Disposition Plan: Status is: Inpatient Remains inpatient appropriate because: IV antibiotics, cannot be discharged with indwelling catheter.     Consultants:  Multiple  Procedures:  None  Antimicrobials:  Micafungin and vancomycin   Subjective: Seen and examined.  No overnight events.  Remains in sinus rhythm.  She tells me she is without any shortness of breath or palpitations.  Objective: Vitals:   11/21/21 1345 11/21/21 2049 11/22/21 0838 11/22/21 1416  BP: 123/74 (!) 126/94 113/89 (!) 118/91  Pulse: (!) 107 91  91  Resp:  18  18  Temp: 98.5 F (36.9 C) 98.2 F (36.8 C)  98.2 F (36.8 C)  TempSrc: Oral Oral  Oral  SpO2:  98%    Weight:      Height:        Intake/Output Summary (Last 24 hours) at 11/22/2021 1507 Last data filed at 11/22/2021 1130 Gross per 24 hour  Intake 510 ml  Output --  Net 510 ml   Filed Weights   11/18/21 0500 11/19/21 0500 11/20/21 0407  Weight: 89 kg 88.4 kg 83.3 kg    Examination:  Appears comfortable.    Data Reviewed: I have personally reviewed following labs and imaging  studies  CBC: Recent Labs  Lab 11/17/21 0500 11/20/21 0002  WBC 8.1 9.0  HGB 10.3* 11.1*  HCT 36.5 39.1  MCV 89.7 90.1  PLT 230 273   Basic Metabolic Panel: Recent Labs  Lab 11/16/21 0017 11/20/21 0002  NA 138 135  K 4.3 3.9  CL 105 106  CO2 26 21*  GLUCOSE 86 95  BUN 6 8  CREATININE 0.69 0.56  CALCIUM 8.9 8.4*   GFR: Estimated Creatinine Clearance: 107.8 mL/min (by C-G formula based on SCr of 0.56 mg/dL). Liver Function Tests: No results for input(s): "AST", "ALT", "ALKPHOS", "BILITOT", "PROT", "ALBUMIN" in the last 168 hours. No results for input(s): "LIPASE", "AMYLASE" in the last 168 hours. No results for input(s): "AMMONIA" in the last 168 hours. Coagulation Profile: No results for input(s): "INR", "PROTIME" in the last 168 hours. Cardiac Enzymes: No results for input(s): "CKTOTAL", "CKMB", "CKMBINDEX", "TROPONINI" in the last 168 hours. BNP (last 3 results) No results for input(s): "PROBNP" in the last 8760 hours. HbA1C: No results for input(s): "HGBA1C" in the last 72 hours. CBG: No results for input(s): "GLUCAP" in the last 168 hours. Lipid Profile: No results for input(s): "CHOL", "HDL", "LDLCALC", "TRIG", "CHOLHDL", "LDLDIRECT" in the last 72 hours. Thyroid Function Tests: No results for input(s): "TSH", "T4TOTAL", "FREET4", "T3FREE", "THYROIDAB" in the last 72 hours. Anemia Panel: No results for input(s): "VITAMINB12", "FOLATE", "FERRITIN", "TIBC", "IRON", "RETICCTPCT" in the last 72 hours. Sepsis Labs: No results for input(s): "PROCALCITON", "LATICACIDVEN"  in the last 168 hours.  No results found for this or any previous visit (from the past 240 hour(s)).       Radiology Studies: No results found.      Scheduled Meds:  amLODipine  5 mg Oral Daily   Chlorhexidine Gluconate Cloth  6 each Topical Daily   dabigatran  150 mg Oral Q12H   feeding supplement  1 Container Oral BID BM   ferrous gluconate  324 mg Oral Q breakfast   gabapentin   400 mg Oral TID   melatonin  3 mg Oral QHS   pantoprazole  40 mg Oral Daily   polyethylene glycol  17 g Oral Daily   senna-docusate  1 tablet Oral QHS   sodium chloride flush  10-40 mL Intracatheter Q12H   Continuous Infusions:  sodium chloride 10 mL/hr at 11/21/21 0354   micafungin (MYCAMINE) 150 mg in sodium chloride 0.9 % 100 mL IVPB Stopped (11/21/21 1839)   vancomycin 750 mg (11/22/21 0950)     LOS: 42 days    Time spent: 25 minutes    Dorcas Carrow, MD [facility] Pager [phone]

## 2021-11-23 ENCOUNTER — Telehealth: Payer: Self-pay

## 2021-11-23 ENCOUNTER — Inpatient Hospital Stay (HOSPITAL_COMMUNITY): Payer: Medicaid Other

## 2021-11-23 ENCOUNTER — Other Ambulatory Visit (HOSPITAL_COMMUNITY): Payer: Self-pay

## 2021-11-23 DIAGNOSIS — R918 Other nonspecific abnormal finding of lung field: Secondary | ICD-10-CM | POA: Diagnosis not present

## 2021-11-23 DIAGNOSIS — I079 Rheumatic tricuspid valve disease, unspecified: Secondary | ICD-10-CM | POA: Diagnosis not present

## 2021-11-23 DIAGNOSIS — I38 Endocarditis, valve unspecified: Secondary | ICD-10-CM

## 2021-11-23 DIAGNOSIS — J90 Pleural effusion, not elsewhere classified: Secondary | ICD-10-CM | POA: Diagnosis not present

## 2021-11-23 DIAGNOSIS — B377 Candidal sepsis: Secondary | ICD-10-CM | POA: Diagnosis not present

## 2021-11-23 DIAGNOSIS — J189 Pneumonia, unspecified organism: Secondary | ICD-10-CM | POA: Diagnosis not present

## 2021-11-23 DIAGNOSIS — I2692 Saddle embolus of pulmonary artery without acute cor pulmonale: Secondary | ICD-10-CM | POA: Diagnosis not present

## 2021-11-23 LAB — CBC
HCT: 39.3 % (ref 36.0–46.0)
Hemoglobin: 11.4 g/dL — ABNORMAL LOW (ref 12.0–15.0)
MCH: 25.6 pg — ABNORMAL LOW (ref 26.0–34.0)
MCHC: 29 g/dL — ABNORMAL LOW (ref 30.0–36.0)
MCV: 88.1 fL (ref 80.0–100.0)
Platelets: 297 10*3/uL (ref 150–400)
RBC: 4.46 MIL/uL (ref 3.87–5.11)
RDW: 23.6 % — ABNORMAL HIGH (ref 11.5–15.5)
WBC: 8.7 10*3/uL (ref 4.0–10.5)
nRBC: 0 % (ref 0.0–0.2)

## 2021-11-23 MED ORDER — FLUCONAZOLE 200 MG PO TABS
400.0000 mg | ORAL_TABLET | Freq: Every day | ORAL | Status: DC
  Administered 2021-11-26: 400 mg via ORAL
  Filled 2021-11-23: qty 2

## 2021-11-23 MED ORDER — AMLODIPINE BESYLATE 5 MG PO TABS
5.0000 mg | ORAL_TABLET | Freq: Every day | ORAL | 2 refills | Status: DC
  Filled 2021-11-23: qty 30, 30d supply, fill #0

## 2021-11-23 MED ORDER — FLUCONAZOLE 200 MG PO TABS
400.0000 mg | ORAL_TABLET | Freq: Every day | ORAL | 2 refills | Status: DC
  Filled 2021-11-23: qty 60, 30d supply, fill #0

## 2021-11-23 MED ORDER — LINEZOLID 600 MG PO TABS
600.0000 mg | ORAL_TABLET | Freq: Two times a day (BID) | ORAL | Status: DC
Start: 2021-11-26 — End: 2021-11-26
  Administered 2021-11-26: 600 mg via ORAL
  Filled 2021-11-23 (×2): qty 1

## 2021-11-23 MED ORDER — LINEZOLID 600 MG PO TABS
600.0000 mg | ORAL_TABLET | Freq: Two times a day (BID) | ORAL | 0 refills | Status: AC
  Filled 2021-11-23: qty 28, 14d supply, fill #0

## 2021-11-23 MED ORDER — FLUCONAZOLE 100 MG PO TABS
100.0000 mg | ORAL_TABLET | Freq: Every day | ORAL | 3 refills | Status: DC
  Filled 2021-11-23: qty 30, 30d supply, fill #0

## 2021-11-23 NOTE — Progress Notes (Signed)
Pharmacy Antibiotic Note Follow up  Latoya [name] is a 36 y.o. female admitted on 10/11/2021 with acute BL PE and TV endocarditis. Patient with history of MSSA and MRSA bacteremia c/b endocarditis and lumbar discitis. Pt recently completed 6 weeks of vancomycin and was started on linezolid after. Pharmacy consulted on 11/03/21 for vancomycin dosing. Micafungin ordered per ID as well with C. Albicans growing in blood. End date of IV antibiotics 11/25/21 per ID.   Vancomycin adjusted 7/17, course completes 7/22 - will defer additional levels at this time.  Plan: Vancomycin 750 IV q12 (eAUC 460)   Height: 5\' 6"  (167.6 cm) Weight: 82.6 kg (182 lb 1.6 oz) IBW/kg (Calculated) : 59.3  Temp (24hrs), Avg:98.3 F (36.8 C), Min:98 F (36.7 C), Max:98.7 F (37.1 C)  Recent Labs  Lab 11/16/21 0930 11/17/21 0500 11/20/21 0002 11/20/21 1010 11/23/21 0516  WBC  --  8.1 9.0  --  8.7  CREATININE  --   --  0.56  --   --   VANCOTROUGH 12*  --   --  13*  --   VANCOPEAK  --   --  39  --   --      Estimated Creatinine Clearance: 107.3 mL/min (by C-G formula based on SCr of 0.56 mg/dL).    Allergies  Allergen Reactions   Naltrexone Anxiety    Severe anxiety Restlessness  Vomiting     Antimicrobials this admission: Vancomycin 6/7 >>  Cefepime 6/7 >> 6/12; 6/30 >> 7/3 Micafungin 6/8 >>  Vancomycin levels 6/23 Vanc levels- AUC 510  6/30 VP > 60 - possibly contaminated by drug - decr'd to 1g q12 for safety          VR 18/ VT 11- AUC 409  7/3 VP 28/VT12 AUC 369> incr 1250 q12  7/7 VT =11 on vanc 1250 mg q12h,  continue at same dose.  7/8 extrapolated AUC 620, will reduce to 1000mg  Q12 hours 7/13 AUC 559. Continue at same dose.  7/14 AUC 613, will reduce dose to 750mg  q12hr    Microbiology results: 6/7 BCx: C. Albicans 6/7 MRSA PCR: negative 6/9 resp- normal flora 6/10 blood x2- neg  8/7, PharmD, BCPS, [facility] Clinical Pharmacist [phone] Please check AMION for all [facility]  Pharmacy numbers 11/23/2021

## 2021-11-23 NOTE — Progress Notes (Signed)
[facility]  Date of Admission:  10/11/2021           Reason for visit: Follow up on tricuspid valve endocarditis, lumbar discitis   Current antibiotics: Vancomycin Micafungin   ASSESSMENT:    36 y.o. female admitted with:  Tricuspid valve endocarditis: Blood cultures have grown Candida albicans from 10/11/2021 this admission with negative repeat cultures on 6/10.  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  She is admitted now as above with a larger tricuspid valve vegetation and new septic arthritis at L4-5.  She is currently on micafungin and vancomycin.  She is not an angio VAC candidate due to a PFO and only option in the future would be valve repair with PFO closure after abstinence from injection drug use shown as an outpatient. Discitis/osteomyelitis: She is on antimicrobials as above. 6/3 mri demonstrate no fluid collection. Crp previously improving. I do not feel we need repeat mri at this time of lumbar spine as symptomatically the pain there had resolved per my discussion 11/23/21 with patient Septic pulmonary emboli: Currently on room air Saddle pulmonary emboli: Also currently on Pradaxa. History of injection drug use: Patient reports that she has been abstinent from opiates since March.   RECOMMENDATIONS:    Continue vancomycin and micafungin until 7/22 On 7/23 would need to transition to fluconazole 400 mg po daily at least 6-12 months if not indefinitely (unless the valve vegetation can be removed Will also give patient 2 more weeks linezolid 600 mg po bid for mrsa vertebral OM, starting 7/23 Please arrange outpatient ct surgery follow up I will repeat echo and chest ct for baseline; she'll need reevaluation in the id clinic for worsening of sx and see if further mrsa coverage is indicated. Unfortunately for her the  mrsa is resistant to both tetracycline and bactrim outpatient follow-up with Dr. Renold Don on 12/01/21 at 3:45pm; I will follow up imaging/labs at that time Will sign off Discussed with primary team   I spent more than 50 minute reviewing data/chart, and coordinating care and >50% direct face to face time providing counseling/discussing diagnostics/treatment plan with patient    Principal Problem:   Acute saddle pulmonary embolism (HCC) Active Problems:   Endocarditis of tricuspid valve   IV drug abuse (HCC)   Lumbar discitis   Hyponatremia   MRSA bacteremia   Chronic pain   Thrombocytopenia (HCC)   Pneumonia of left lower lobe due to infectious organism   Candidemia (HCC)   Pleuritic chest pain    MEDICATIONS:    Scheduled Meds:  amLODipine  5 mg Oral Daily   Chlorhexidine Gluconate Cloth  6 each Topical Daily   dabigatran  150 mg Oral Q12H   feeding supplement  1 Container Oral BID BM   ferrous gluconate  324 mg Oral Q breakfast   gabapentin  400 mg Oral TID   melatonin  3 mg Oral QHS   pantoprazole  40 mg Oral Daily   polyethylene glycol  17 g Oral Daily   senna-docusate  1 tablet Oral QHS   sodium chloride flush  10-40 mL Intracatheter Q12H   Continuous Infusions:  sodium chloride 10 mL/hr at 11/21/21 0354   micafungin (MYCAMINE) 150 mg in sodium chloride 0.9 % 100 mL IVPB 150 mg (11/22/21 1654)   vancomycin 750 mg (11/23/21 1030)  PRN Meds:.sodium chloride, acetaminophen **OR** acetaminophen, benzonatate, chlorpheniramine-HYDROcodone, docusate sodium, guaiFENesin-dextromethorphan, hydrALAZINE, menthol-cetylpyridinium, ondansetron **OR** ondansetron (ZOFRAN) IV, oxyCODONE-acetaminophen **OR** [DISCONTINUED]  morphine injection, polyethylene glycol, prochlorperazine, sodium chloride flush  SUBJECTIVE:   24 hour events:  Still dyspnea on exertion and chest pain with breathing No n/v/diarrhea No back pain No other joint pain   ROS Other ros negative   OBJECTIVE:    Blood pressure (!) 120/103, pulse 90, temperature 98 F (36.7 C), temperature source Oral, resp. rate 17, height 5\' 6"  (1.676 m), weight 82.6 kg, SpO2 98 %. Body mass index is 29.39 kg/m.  Physical exam: General/constitutional: no distress, pleasant HEENT: Normocephalic, PER, Conj Clear, EOMI, Oropharynx clear Neck supple CV: rrr no mrg Lungs: clear to auscultation, normal respiratory effort Abd: Soft, Nontender Ext: no edema Skin: No Rash Neuro: nonfocal; emotional/cried when discussed seriousness/prognosis of fungal endocarditis MSK: no peripheral joint swelling/tenderness/warmth; back spines nontender    Lab Results: Lab Results  Component Value Date   WBC 8.7 11/23/2021   HGB 11.4 (L) 11/23/2021   HCT 39.3 11/23/2021   MCV 88.1 11/23/2021   PLT 297 11/23/2021    Lab Results  Component Value Date   NA 135 11/20/2021   K 3.9 11/20/2021   CO2 21 (L) 11/20/2021   GLUCOSE 95 11/20/2021   BUN 8 11/20/2021   CREATININE 0.56 11/20/2021   CALCIUM 8.4 (L) 11/20/2021   GFRNONAA >60 11/20/2021   GFRAA >60 08/07/2019    Lab Results  Component Value Date   ALT 7 10/25/2021   AST 13 (L) 10/25/2021   ALKPHOS 57 10/25/2021   BILITOT 0.2 (L) 10/25/2021       Component Value Date/Time   CRP 13.3 (H) 10/11/2021 0257       Component Value Date/Time   ESRSEDRATE 80 (H) 09/13/2021 0155     I have reviewed the micro and lab results in Epic.  Imaging: Imaging independently reviewed in Epic. And incorporated into decision making   7/14 cxr There is interval partial clearing of infiltrates in both lungs suggesting resolving pneumonia. Still, there are residual patchy infiltrates in both perihilar regions, more so on the right side suggesting residual multifocal pneumonia. No new focal pulmonary consolidation is seen. Possible minimal bilateral pleural effusions.   10/09/21 tte 1. Left ventricular ejection fraction, by estimation, is 60 to 65%. The  left ventricle  has normal function. The left ventricle has no regional  wall motion abnormalities. Left ventricular diastolic parameters were  normal.   2. Right ventricular systolic function is normal. The right ventricular  size is moderately enlarged. There is mildly elevated pulmonary artery  systolic pressure.   3. Right atrial size was moderately dilated.   4. A small pericardial effusion is present.   5. The mitral valve is grossly normal. No evidence of mitral valve  regurgitation. No evidence of mitral stenosis.   6. Mobile echodensity again visualized, 1.9 cm by 0.8 cm. The tricuspid  valve is abnormal. Tricuspid valve regurgitation is severe.   7. The aortic valve is tricuspid. Aortic valve regurgitation is not  visualized. No aortic stenosis is present.   8. The inferior vena cava is dilated in size with >50% respiratory  variability, suggesting right atrial pressure of 8 mmHg.   Comparison(s): Prior images reviewed side by side.   Conclusion(s)/Recommendation(s): Tricuspid valve endocarditis persists,  with severe TR and moderate RV dilation.     6/03 mri lumbar spine 1. Mild reactive marrow edema about the L4-5 facets bilaterally.  While these findings could be secondary to facet osteoarthritis, possible septic arthritis could be considered in this clinical setting. Associated soft tissue edema within the lower posterior paraspinous soft tissues. No soft tissue collections. 2. Small disc protrusions at L4-5 and T12-L1 without stenosis.         [facility] [phone] pager 11/23/2021, 1:58 PM

## 2021-11-23 NOTE — Progress Notes (Signed)
  Echocardiogram 2D Echocardiogram has been performed.  Latoya [name] 11/23/2021, 3:33 PM

## 2021-11-23 NOTE — Progress Notes (Signed)
PROGRESS NOTE    Latoya [name]  QQI:[id number] DOB: 1985/08/10 DOA: 10/11/2021 PCP: Pcp, No    Brief Narrative:  IV drug user with candidemia, previous history of MSSA endocarditis currently completing IV vancomycin and micafungin.  Remains in the hospital to complete antibiotics.   Assessment & Plan:   Acute saddle pulmonary embolism: Currently on Pradaxa. Sepsis, multifactorial, candidemia and endocarditis, discitis  Antibiotics until 7/22, oral Diflucan on discharge.  Outpatient ID follow-up.  Electrolytes: Resolved.  Chronic pain syndrome, on Neurontin.  Currently on oxycodone.  Taper off.  Unable to prescribe on discharge.  Essential hypertension: On Norvasc.  Continue.  Prescription sent to pharmacy for anticipated discharge on the weekend.  DVT prophylaxis:  dabigatran (PRADAXA) capsule 150 mg   Code Status: Full code Family Communication: None Disposition Plan: Status is: Inpatient Remains inpatient appropriate because: IV antibiotics, cannot be discharged with indwelling catheter.     Consultants:  Multiple  Procedures:  None  Antimicrobials:  Micafungin and vancomycin   Subjective:  Seen and examined no new events.  Objective: Vitals:   11/22/21 0838 11/22/21 1416 11/22/21 2034 11/23/21 0432  BP: 113/89 (!) 118/91 (!) 114/93 (!) 120/103  Pulse:  91  90  Resp:  18 17   Temp:  98.2 F (36.8 C) 98.7 F (37.1 C) 98 F (36.7 C)  TempSrc:  Oral Oral Oral  SpO2:   96% 98%  Weight:    82.6 kg  Height:        Intake/Output Summary (Last 24 hours) at 11/23/2021 1259 Last data filed at 11/22/2021 2140 Gross per 24 hour  Intake 780 ml  Output --  Net 780 ml    Filed Weights   11/19/21 0500 11/20/21 0407 11/23/21 0432  Weight: 88.4 kg 83.3 kg 82.6 kg    Examination:  Appears comfortable lying in bed.  On room air.    Data Reviewed: I have personally reviewed following labs and imaging studies  CBC: Recent Labs  Lab 11/17/21 0500  11/20/21 0002 11/23/21 0516  WBC 8.1 9.0 8.7  HGB 10.3* 11.1* 11.4*  HCT 36.5 39.1 39.3  MCV 89.7 90.1 88.1  PLT 230 273 297    Basic Metabolic Panel: Recent Labs  Lab 11/20/21 0002  NA 135  K 3.9  CL 106  CO2 21*  GLUCOSE 95  BUN 8  CREATININE 0.56  CALCIUM 8.4*    GFR: Estimated Creatinine Clearance: 107.3 mL/min (by C-G formula based on SCr of 0.56 mg/dL). Liver Function Tests: No results for input(s): "AST", "ALT", "ALKPHOS", "BILITOT", "PROT", "ALBUMIN" in the last 168 hours. No results for input(s): "LIPASE", "AMYLASE" in the last 168 hours. No results for input(s): "AMMONIA" in the last 168 hours. Coagulation Profile: No results for input(s): "INR", "PROTIME" in the last 168 hours. Cardiac Enzymes: No results for input(s): "CKTOTAL", "CKMB", "CKMBINDEX", "TROPONINI" in the last 168 hours. BNP (last 3 results) No results for input(s): "PROBNP" in the last 8760 hours. HbA1C: No results for input(s): "HGBA1C" in the last 72 hours. CBG: No results for input(s): "GLUCAP" in the last 168 hours. Lipid Profile: No results for input(s): "CHOL", "HDL", "LDLCALC", "TRIG", "CHOLHDL", "LDLDIRECT" in the last 72 hours. Thyroid Function Tests: No results for input(s): "TSH", "T4TOTAL", "FREET4", "T3FREE", "THYROIDAB" in the last 72 hours. Anemia Panel: No results for input(s): "VITAMINB12", "FOLATE", "FERRITIN", "TIBC", "IRON", "RETICCTPCT" in the last 72 hours. Sepsis Labs: No results for input(s): "PROCALCITON", "LATICACIDVEN" in the last 168 hours.  No results found for this  or any previous visit (from the past 240 hour(s)).       Radiology Studies: No results found.      Scheduled Meds:  amLODipine  5 mg Oral Daily   Chlorhexidine Gluconate Cloth  6 each Topical Daily   dabigatran  150 mg Oral Q12H   feeding supplement  1 Container Oral BID BM   ferrous gluconate  324 mg Oral Q breakfast   gabapentin  400 mg Oral TID   melatonin  3 mg Oral QHS    pantoprazole  40 mg Oral Daily   polyethylene glycol  17 g Oral Daily   senna-docusate  1 tablet Oral QHS   sodium chloride flush  10-40 mL Intracatheter Q12H   Continuous Infusions:  sodium chloride 10 mL/hr at 11/21/21 0354   micafungin (MYCAMINE) 150 mg in sodium chloride 0.9 % 100 mL IVPB 150 mg (11/22/21 1654)   vancomycin 750 mg (11/23/21 1030)     LOS: 43 days    Time spent: 25 minutes    Dorcas Carrow, MD [facility] Pager [phone]

## 2021-11-23 NOTE — Telephone Encounter (Signed)
Patient mother called concerned about discharge Saturday. She states her daughter and her both are worried since 1 heart valve is functioning that this would be life threatening. Requesting to discuss with Dr Renold Don. I messaged Dr Renold Don who advised that patient needs to contact hospital head nurse and cardiology as Dr Renold Don has no control over Discharge dates. Advised mother and she will discuss with admissions.

## 2021-11-24 DIAGNOSIS — M4646 Discitis, unspecified, lumbar region: Secondary | ICD-10-CM | POA: Diagnosis not present

## 2021-11-24 DIAGNOSIS — I079 Rheumatic tricuspid valve disease, unspecified: Secondary | ICD-10-CM | POA: Diagnosis not present

## 2021-11-24 DIAGNOSIS — B377 Candidal sepsis: Secondary | ICD-10-CM | POA: Diagnosis not present

## 2021-11-24 DIAGNOSIS — I2692 Saddle embolus of pulmonary artery without acute cor pulmonale: Secondary | ICD-10-CM | POA: Diagnosis not present

## 2021-11-24 DIAGNOSIS — F1910 Other psychoactive substance abuse, uncomplicated: Secondary | ICD-10-CM | POA: Diagnosis not present

## 2021-11-24 DIAGNOSIS — B9562 Methicillin resistant Staphylococcus aureus infection as the cause of diseases classified elsewhere: Secondary | ICD-10-CM | POA: Diagnosis not present

## 2021-11-24 DIAGNOSIS — R7881 Bacteremia: Secondary | ICD-10-CM | POA: Diagnosis not present

## 2021-11-24 LAB — C-REACTIVE PROTEIN: CRP: 1.6 mg/dL — ABNORMAL HIGH (ref <1.0)

## 2021-11-24 LAB — ECHOCARDIOGRAM LIMITED
Height: 66 in
S' Lateral: 2.4 cm
Weight: 2913.6 oz

## 2021-11-24 NOTE — Progress Notes (Signed)
PROGRESS NOTE    Latoya [name]  SMO:[id number] DOB: 08-15-1985 DOA: 10/11/2021 PCP: Pcp, No    Brief Narrative:  IV drug user with candidemia, previous history of MSSA endocarditis currently completing IV vancomycin and micafungin.  Remains in the hospital to complete antibiotics as she cannot be discharged with indwelling catheter. Patient has persistent vegetation and tricuspid valve regurgitation.   Assessment & Plan:   Acute saddle pulmonary embolism: Currently on Pradaxa.  Stable.  Sepsis, multifactorial, candidemia and endocarditis, discitis  Antibiotics until 7/22, oral Diflucan and Zyvox on discharge.  Outpatient ID follow-up.  Electrolytes: Resolved.  Chronic pain syndrome, on Neurontin.  Currently on oxycodone.  Taper off.  Unable to prescribe on discharge.  Essential hypertension: On Norvasc.  Continue.  Prescription sent to pharmacy for anticipated discharge on 7/23.  Tricuspid valve vegetation: Discussed with CT surgery.  Patient known to CT surgery. As recommended by [facility], they will only attempt surgery if patient remains without using drugs and outpatient settings.  Referral sent for follow-up.  DVT prophylaxis:  dabigatran (PRADAXA) capsule 150 mg   Code Status: Full code Family Communication: Mother on phone. Disposition Plan: Status is: Inpatient Remains inpatient appropriate because: IV antibiotics, cannot be discharged with indwelling catheter.     Consultants:  Multiple  Procedures:  None  Antimicrobials:  Micafungin and vancomycin   Subjective:  Patient seen and examined.  Laying in bed.  No symptoms at rest, however she tells me that she gets short of breath on walking around. Case discussed with infectious disease.  Repeat CT scans in echocardiogram reviewed. Case discussed with CT surgery, Dr. Cliffton Asters who recommended follow-up after discharge.  Objective: Vitals:   11/23/21 2033 11/24/21 0500 11/24/21 0535 11/24/21 1003  BP: (!)  119/94  (!) 130/96 (!) 108/93  Pulse: 93     Resp: 17     Temp: 97.8 F (36.6 C)  98.3 F (36.8 C)   TempSrc: Axillary  Oral   SpO2: 97%     Weight:  82.1 kg    Height:        Intake/Output Summary (Last 24 hours) at 11/24/2021 1120 Last data filed at 11/23/2021 2045 Gross per 24 hour  Intake 240 ml  Output --  Net 240 ml    Filed Weights   11/20/21 0407 11/23/21 0432 11/24/21 0500  Weight: 83.3 kg 82.6 kg 82.1 kg    Examination:  Appears comfortable lying in bed.  On room air. Slightly anxious today.    Data Reviewed: I have personally reviewed following labs and imaging studies  CBC: Recent Labs  Lab 11/20/21 0002 11/23/21 0516  WBC 9.0 8.7  HGB 11.1* 11.4*  HCT 39.1 39.3  MCV 90.1 88.1  PLT 273 297    Basic Metabolic Panel: Recent Labs  Lab 11/20/21 0002  NA 135  K 3.9  CL 106  CO2 21*  GLUCOSE 95  BUN 8  CREATININE 0.56  CALCIUM 8.4*    GFR: Estimated Creatinine Clearance: 107 mL/min (by C-G formula based on SCr of 0.56 mg/dL). Liver Function Tests: No results for input(s): "AST", "ALT", "ALKPHOS", "BILITOT", "PROT", "ALBUMIN" in the last 168 hours. No results for input(s): "LIPASE", "AMYLASE" in the last 168 hours. No results for input(s): "AMMONIA" in the last 168 hours. Coagulation Profile: No results for input(s): "INR", "PROTIME" in the last 168 hours. Cardiac Enzymes: No results for input(s): "CKTOTAL", "CKMB", "CKMBINDEX", "TROPONINI" in the last 168 hours. BNP (last 3 results) No results for input(s): "PROBNP" in  the last 8760 hours. HbA1C: No results for input(s): "HGBA1C" in the last 72 hours. CBG: No results for input(s): "GLUCAP" in the last 168 hours. Lipid Profile: No results for input(s): "CHOL", "HDL", "LDLCALC", "TRIG", "CHOLHDL", "LDLDIRECT" in the last 72 hours. Thyroid Function Tests: No results for input(s): "TSH", "T4TOTAL", "FREET4", "T3FREE", "THYROIDAB" in the last 72 hours. Anemia Panel: No results for  input(s): "VITAMINB12", "FOLATE", "FERRITIN", "TIBC", "IRON", "RETICCTPCT" in the last 72 hours. Sepsis Labs: No results for input(s): "PROCALCITON", "LATICACIDVEN" in the last 168 hours.  No results found for this or any previous visit (from the past 240 hour(s)).       Radiology Studies: ECHOCARDIOGRAM LIMITED  Result Date: 11/24/2021    ECHOCARDIOGRAM LIMITED REPORT   Patient Name:   Latoya [name] Date of Exam: 11/23/2021 Medical Rec #:  [id number]      Height:       66.0 in Accession #:    [id number]     Weight:       182.1 lb Date of Birth:  Aug 26, 1985     BSA:          1.922 m Patient Age:    35 years       BP:           120/103 mmHg Patient Gender: F              HR:           96 bpm. Exam Location:  Inpatient Procedure: Limited Echo, 3D Echo, Cardiac Doppler and Color Doppler REPORT CONTAINS CRITICAL RESULT                            MODIFIED REPORT: This report was modified by Chilton Si MD on 11/24/2021 due to TR.  Indications:     Endocarditis  History:         Patient has prior history of Echocardiogram examinations, most                  recent 10/09/2021. Signs/Symptoms:Chest Pain, Shortness of Breath                  and Dyspnea. Tricuspid endocarditis.  Sonographer:     Sheralyn Boatman RDCS Referring Phys:  Tonita Phoenix VU Diagnosing Phys: Chilton Si MD  Sonographer Comments: Technically difficult study due to poor echo windows. Image acquisition challenging due to patient body habitus. IMPRESSIONS  1. Anteroseptal hypokinesis. Left ventricular ejection fraction, by estimation, is 45 to 50%. The left ventricle has mildly decreased function. The left ventricle demonstrates regional wall motion abnormalities (see scoring diagram/findings for description). There is moderate concentric left ventricular hypertrophy.  2. Right ventricular systolic function is mildly reduced. The right ventricular size is moderately enlarged. There is mildly elevated pulmonary artery systolic pressure.  3. The  mitral valve is abnormal.  4. There is a large (2.2 x 2.0 cm) mobile vegetation on the anterior leaflet. The tricuspid valve is abnormal. Tricuspid valve regurgitation is severe.  5. The aortic valve is tricuspid.  6. The inferior vena cava is dilated in size with <50% respiratory variability, suggesting right atrial pressure of 15 mmHg. FINDINGS  Left Ventricle: Anteroseptal hypokinesis. Left ventricular ejection fraction, by estimation, is 45 to 50%. The left ventricle has mildly decreased function. The left ventricle demonstrates regional wall motion abnormalities. There is moderate concentric  left ventricular hypertrophy. Right Ventricle: The right ventricular size is moderately enlarged. Right ventricular systolic  function is mildly reduced. There is mildly elevated pulmonary artery systolic pressure. The tricuspid regurgitant velocity is 2.42 m/s, and with an assumed right atrial pressure of 15 mmHg, the estimated right ventricular systolic pressure is 38.4 mmHg. Mitral Valve: The mitral valve is abnormal. Tricuspid Valve: There is a large (2.2 x 2.0 cm) mobile vegetation on the anterior leaflet. The tricuspid valve is abnormal. Tricuspid valve regurgitation is severe. No evidence of tricuspid stenosis. Aortic Valve: The aortic valve is tricuspid. Venous: The inferior vena cava is dilated in size with less than 50% respiratory variability, suggesting right atrial pressure of 15 mmHg. LEFT VENTRICLE PLAX 2D LVIDd:         3.15 cm LVIDs:         2.40 cm LV PW:         1.65 cm LV IVS:        1.50 cm  IVC IVC diam: 3.00 cm LEFT ATRIUM         Index LA diam:    3.20 cm 1.67 cm/m   AORTA Ao Root diam: 3.15 cm Ao Asc diam:  3.10 cm TRICUSPID VALVE TR Peak grad:   23.4 mmHg TR Vmax:        242.00 cm/s Chilton Si MD Electronically signed by Chilton Si MD Signature Date/Time: 11/23/2021/5:56:24 PM    Final (Updated)    CT CHEST WO CONTRAST  Result Date: 11/23/2021 CLINICAL DATA:  Pneumonia, complication  suspected, xray done chest pain ongoing, septic pulm emboli finishing up treatment EXAM: CT CHEST WITHOUT CONTRAST TECHNIQUE: Multidetector CT imaging of the chest was performed following the standard protocol without IV contrast. RADIATION DOSE REDUCTION: This exam was performed according to the departmental dose-optimization program which includes automated exposure control, adjustment of the mA and/or kV according to patient size and/or use of iterative reconstruction technique. COMPARISON:  11/08/2021 FINDINGS: Cardiovascular: Heart is normal size. Aorta is normal caliber. Left PICC line in place with the tip in the SVC. Mediastinum/Nodes: No mediastinal, hilar, or axillary adenopathy. Trachea and esophagus are unremarkable. Thyroid unremarkable. Lungs/Pleura: Bilateral nodular airspace disease. This has progressed throughout the right lung since prior study, while slightly improved in the left lung since prior study. Small right pleural effusion and trace left effusion, new since prior study. Upper Abdomen: No acute findings Musculoskeletal: Chest wall soft tissues are unremarkable. No acute bony abnormality. IMPRESSION: Bilateral nodular airspace disease compatible with septic emboli or pneumonia. This is worsening in the right lung since prior study and improving on the left. Small right pleural effusion and trace left pleural effusion. Electronically Signed   By: Charlett Nose M.D.   On: 11/23/2021 17:40        Scheduled Meds:  amLODipine  5 mg Oral Daily   Chlorhexidine Gluconate Cloth  6 each Topical Daily   dabigatran  150 mg Oral Q12H   feeding supplement  1 Container Oral BID BM   ferrous gluconate  324 mg Oral Q breakfast   [START ON 11/26/2021] fluconazole  400 mg Oral Daily   gabapentin  400 mg Oral TID   [START ON 11/26/2021] linezolid  600 mg Oral Q12H   melatonin  3 mg Oral QHS   pantoprazole  40 mg Oral Daily   polyethylene glycol  17 g Oral Daily   senna-docusate  1 tablet Oral  QHS   sodium chloride flush  10-40 mL Intracatheter Q12H   Continuous Infusions:  sodium chloride 10 mL/hr at 11/21/21 0354   micafungin (MYCAMINE)  150 mg in sodium chloride 0.9 % 100 mL IVPB 150 mg (11/23/21 1632)   vancomycin 750 mg (11/24/21 1003)     LOS: 44 days    Time spent: 25 minutes    Dorcas Carrow, MD [facility] Pager [phone]

## 2021-11-24 NOTE — Progress Notes (Signed)
[facility]  Date of Admission:  10/11/2021           Reason for visit: Follow up on tricuspid valve endocarditis, lumbar discitis   Current antibiotics: Vancomycin Micafungin   ASSESSMENT:    36 y.o. female admitted with:  Tricuspid valve endocarditis: Blood cultures have grown Candida albicans from 10/11/2021 this admission with negative repeat cultures on 6/10.  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  She is admitted now as above with a larger tricuspid valve vegetation and new septic arthritis at L4-5.  She is currently on micafungin and vancomycin.  She is not an angio VAC candidate due to a PFO and only option in the future would be valve repair with PFO closure after abstinence from injection drug use shown as an outpatient. Chest ct 7/20 mixed changes as below. Tte repeat slight increased size of the tv veg... discussed with her again that she'll need antifungal coverage for a long time. Ultimately valve repair is needed and if antifungal tx fails will see when CTS feels comfortable about doing surgery Discitis/osteomyelitis: She is on antimicrobials as above. 6/3 mri demonstrate no fluid collection. Crp previously improving. I do not feel we need repeat mri at this time of lumbar spine as symptomatically the pain there had resolved per my discussion 11/23/21 with patient. Will plan on given 2 more weeks linezolid for her mrsa infection presumed back involvement starting 7/22. Close follow up in clinic needed to see if clinically worsens off mrsa coverage Septic pulmonary emboli: Currently on room air Saddle pulmonary emboli: Also currently on Pradaxa. History of injection drug use: Patient reports that she has been abstinent from opiates since March.   RECOMMENDATIONS:    Will finish micafungin and vanc on 7/22 On 7/23 would  need to transition to fluconazole 400 mg po daily at least 6-12 months if not indefinitely (unless the valve vegetation can be removed Will also give patient 2 more weeks linezolid 600 mg po bid for mrsa vertebral OM, starting 7/23 CT surgery team reaffirms that patient will need to demonstrate abstinence from abuse drugs prior to them considering valve repair Please arrange outpatient ct surgery follow up outpatient follow-up with Dr. Renold Don on 12/01/21 at 3:45pm; I will follow up imaging/labs at that time Will sign off Discussed with primary team   I spent more than 35 minute reviewing data/chart, and coordinating care and >50% direct face to face time providing counseling/discussing diagnostics/treatment plan with patient     Principal Problem:   Acute saddle pulmonary embolism (HCC) Active Problems:   Endocarditis of tricuspid valve   IV drug abuse (HCC)   Lumbar discitis   Hyponatremia   MRSA bacteremia   Chronic pain   Thrombocytopenia (HCC)   Pneumonia of left lower lobe due to infectious organism   Candidemia (HCC)   Pleuritic chest pain    MEDICATIONS:    Scheduled Meds:  amLODipine  5 mg Oral Daily   Chlorhexidine Gluconate Cloth  6 each Topical Daily   dabigatran  150 mg Oral Q12H   feeding supplement  1 Container Oral BID BM   ferrous gluconate  324 mg Oral Q breakfast   [START ON 11/26/2021] fluconazole  400 mg Oral Daily   gabapentin  400 mg Oral TID   [START ON 11/26/2021] linezolid  600 mg Oral  Q12H   melatonin  3 mg Oral QHS   pantoprazole  40 mg Oral Daily   polyethylene glycol  17 g Oral Daily   senna-docusate  1 tablet Oral QHS   sodium chloride flush  10-40 mL Intracatheter Q12H   Continuous Infusions:  sodium chloride 10 mL/hr at 11/21/21 0354   micafungin (MYCAMINE) 150 mg in sodium chloride 0.9 % 100 mL IVPB 150 mg (11/23/21 1632)   vancomycin 750 mg (11/24/21 1003)   PRN Meds:.sodium chloride, acetaminophen **OR** acetaminophen, benzonatate,  chlorpheniramine-HYDROcodone, docusate sodium, guaiFENesin-dextromethorphan, hydrALAZINE, menthol-cetylpyridinium, ondansetron **OR** ondansetron (ZOFRAN) IV, oxyCODONE-acetaminophen **OR** [DISCONTINUED]  morphine injection, polyethylene glycol, prochlorperazine, sodium chloride flush  SUBJECTIVE:   24 hour events:  Patient not as emotional today No new changes   ROS Other ros negative   OBJECTIVE:   Blood pressure (!) 108/93, pulse 93, temperature 98.3 F (36.8 C), temperature source Oral, resp. rate 17, height 5\' 6"  (1.676 m), weight 82.1 kg, SpO2 97 %. Body mass index is 29.23 kg/m.  Physical exam: General/constitutional: no distress, pleasant HEENT: Normocephalic, PER, Conj Clear, EOMI, Oropharynx clear Neck supple CV: rrr no mrg Lungs: clear to auscultation, normal respiratory effort Abd: Soft, Nontender Ext: no edema Skin: No Rash Neuro: nonfocal MSK: no peripheral joint swelling/tenderness/warmth; back spines nontender     Lab Results: Lab Results  Component Value Date   WBC 8.7 11/23/2021   HGB 11.4 (L) 11/23/2021   HCT 39.3 11/23/2021   MCV 88.1 11/23/2021   PLT 297 11/23/2021    Lab Results  Component Value Date   NA 135 11/20/2021   K 3.9 11/20/2021   CO2 21 (L) 11/20/2021   GLUCOSE 95 11/20/2021   BUN 8 11/20/2021   CREATININE 0.56 11/20/2021   CALCIUM 8.4 (L) 11/20/2021   GFRNONAA >60 11/20/2021   GFRAA >60 08/07/2019    Lab Results  Component Value Date   ALT 7 10/25/2021   AST 13 (L) 10/25/2021   ALKPHOS 57 10/25/2021   BILITOT 0.2 (L) 10/25/2021       Component Value Date/Time   CRP 1.6 (H) 11/24/2021 0500       Component Value Date/Time   ESRSEDRATE 80 (H) 09/13/2021 0155     I have reviewed the micro and lab results in Epic.  Imaging: Imaging independently reviewed in Epic. And incorporated into decision making   7/14 cxr There is interval partial clearing of infiltrates in both lungs suggesting resolving pneumonia.  Still, there are residual patchy infiltrates in both perihilar regions, more so on the right side suggesting residual multifocal pneumonia. No new focal pulmonary consolidation is seen. Possible minimal bilateral pleural effusions.   10/09/21 tte 1. Left ventricular ejection fraction, by estimation, is 60 to 65%. The  left ventricle has normal function. The left ventricle has no regional  wall motion abnormalities. Left ventricular diastolic parameters were  normal.   2. Right ventricular systolic function is normal. The right ventricular  size is moderately enlarged. There is mildly elevated pulmonary artery  systolic pressure.   3. Right atrial size was moderately dilated.   4. A small pericardial effusion is present.   5. The mitral valve is grossly normal. No evidence of mitral valve  regurgitation. No evidence of mitral stenosis.   6. Mobile echodensity again visualized, 1.9 cm by 0.8 cm. The tricuspid  valve is abnormal. Tricuspid valve regurgitation is severe.   7. The aortic valve is tricuspid. Aortic valve regurgitation is not  visualized. No aortic stenosis  is present.   8. The inferior vena cava is dilated in size with >50% respiratory  variability, suggesting right atrial pressure of 8 mmHg.   Comparison(s): Prior images reviewed side by side.   Conclusion(s)/Recommendation(s): Tricuspid valve endocarditis persists,  with severe TR and moderate RV dilation.     6/03 mri lumbar spine 1. Mild reactive marrow edema about the L4-5 facets bilaterally. While these findings could be secondary to facet osteoarthritis, possible septic arthritis could be considered in this clinical setting. Associated soft tissue edema within the lower posterior paraspinous soft tissues. No soft tissue collections. 2. Small disc protrusions at L4-5 and T12-L1 without stenosis.         [facility] [phone]  pager 11/24/2021, 12:17 PM

## 2021-11-24 NOTE — Progress Notes (Signed)
CSW received consult for letter for court for patient. CSW spoke with patient at bedside. Patient request letter for court. CSW provided patient with letter for court. Patient accepted. CSW offered patient outpatient substance use treatment services resources. Patient declined. All questions answered. No further questions reported at this time.

## 2021-11-25 DIAGNOSIS — B377 Candidal sepsis: Secondary | ICD-10-CM | POA: Diagnosis not present

## 2021-11-25 DIAGNOSIS — I079 Rheumatic tricuspid valve disease, unspecified: Secondary | ICD-10-CM | POA: Diagnosis not present

## 2021-11-25 DIAGNOSIS — I2692 Saddle embolus of pulmonary artery without acute cor pulmonale: Secondary | ICD-10-CM | POA: Diagnosis not present

## 2021-11-25 NOTE — Progress Notes (Signed)
PROGRESS NOTE    Latoya [name]  URK:[id number] DOB: [birth year] DOA: 10/11/2021 PCP: Pcp, No    Brief Narrative:  IV drug user with candidemia, previous history of MSSA endocarditis currently completing IV vancomycin and micafungin.  Remains in the hospital to complete antibiotics as she cannot be discharged with indwelling catheter. Patient has persistent vegetation and tricuspid valve regurgitation. She will complete IV antibiotics tonight.   Assessment & Plan:   Acute saddle pulmonary embolism: Currently on Pradaxa.  Stable.  Sepsis, multifactorial, candidemia and endocarditis, discitis  Antibiotics until 7/22, oral Diflucan and Zyvox on discharge.  Outpatient ID follow-up.  Electrolytes: Resolved.  Chronic pain syndrome, on Neurontin.  Currently on oxycodone.  Taper off.  Unable to prescribe on discharge.  Essential hypertension: On Norvasc.  Continue.  Prescription sent to pharmacy for anticipated discharge on 7/23.  Tricuspid valve vegetation: Discussed with CT surgery.  Patient known to CT surgery. As recommended by [facility], they will only attempt surgery if patient remains without using drugs and outpatient settings.  Referral sent for follow-up.  DVT prophylaxis:  dabigatran (PRADAXA) capsule 150 mg   Code Status: Full code Family Communication: Mother on phone 7/21. Disposition Plan: Status is: Inpatient Remains inpatient appropriate because: IV antibiotics, cannot be discharged with indwelling catheter.  Anticipate discharge tomorrow.     Consultants:  Multiple  Procedures:  None  Antimicrobials:  Micafungin and vancomycin   Subjective:  No new events.  Going home with mom tomorrow.  Objective: Vitals:   11/24/21 1003 11/24/21 2126 11/25/21 0438 11/25/21 0937  BP: (!) 108/93 (!) 131/91 (!) 117/93 (!) 124/101  Pulse:      Resp:      Temp:  97.8 F (36.6 C) 98.3 F (36.8 C)   TempSrc:  Axillary Oral   SpO2:      Weight:   84.7 kg   Height:         Intake/Output Summary (Last 24 hours) at 11/25/2021 1151 Last data filed at 11/25/2021 0845 Gross per 24 hour  Intake 1200 ml  Output --  Net 1200 ml    Filed Weights   11/23/21 0432 11/24/21 0500 11/25/21 0438  Weight: 82.6 kg 82.1 kg 84.7 kg    Examination:  Looks comfortable.  Pleasant to conversation.    Data Reviewed: I have personally reviewed following labs and imaging studies  CBC: Recent Labs  Lab 11/20/21 0002 11/23/21 0516  WBC 9.0 8.7  HGB 11.1* 11.4*  HCT 39.1 39.3  MCV 90.1 88.1  PLT 273 297    Basic Metabolic Panel: Recent Labs  Lab 11/20/21 0002  NA 135  K 3.9  CL 106  CO2 21*  GLUCOSE 95  BUN 8  CREATININE 0.56  CALCIUM 8.4*    GFR: Estimated Creatinine Clearance: 108.7 mL/min (by C-G formula based on SCr of 0.56 mg/dL). Liver Function Tests: No results for input(s): "AST", "ALT", "ALKPHOS", "BILITOT", "PROT", "ALBUMIN" in the last 168 hours. No results for input(s): "LIPASE", "AMYLASE" in the last 168 hours. No results for input(s): "AMMONIA" in the last 168 hours. Coagulation Profile: No results for input(s): "INR", "PROTIME" in the last 168 hours. Cardiac Enzymes: No results for input(s): "CKTOTAL", "CKMB", "CKMBINDEX", "TROPONINI" in the last 168 hours. BNP (last 3 results) No results for input(s): "PROBNP" in the last 8760 hours. HbA1C: No results for input(s): "HGBA1C" in the last 72 hours. CBG: No results for input(s): "GLUCAP" in the last 168 hours. Lipid Profile: No results for input(s): "CHOL", "HDL", "LDLCALC", "  TRIG", "CHOLHDL", "LDLDIRECT" in the last 72 hours. Thyroid Function Tests: No results for input(s): "TSH", "T4TOTAL", "FREET4", "T3FREE", "THYROIDAB" in the last 72 hours. Anemia Panel: No results for input(s): "VITAMINB12", "FOLATE", "FERRITIN", "TIBC", "IRON", "RETICCTPCT" in the last 72 hours. Sepsis Labs: No results for input(s): "PROCALCITON", "LATICACIDVEN" in the last 168 hours.  No results found  for this or any previous visit (from the past 240 hour(s)).       Radiology Studies: ECHOCARDIOGRAM LIMITED  Result Date: 11/24/2021    ECHOCARDIOGRAM LIMITED REPORT   Patient Name:   Latoya [name] Date of Exam: 11/23/2021 Medical Rec #:  [id number]      Height:       66.0 in Accession #:    [id number]     Weight:       182.1 lb Date of Birth:  February 15, 1986     BSA:          1.922 m Patient Age:    35 years       BP:           120/103 mmHg Patient Gender: F              HR:           96 bpm. Exam Location:  Inpatient Procedure: Limited Echo, 3D Echo, Cardiac Doppler and Color Doppler REPORT CONTAINS CRITICAL RESULT                            MODIFIED REPORT: This report was modified by Chilton Si MD on 11/24/2021 due to TR.  Indications:     Endocarditis  History:         Patient has prior history of Echocardiogram examinations, most                  recent 10/09/2021. Signs/Symptoms:Chest Pain, Shortness of Breath                  and Dyspnea. Tricuspid endocarditis.  Sonographer:     Sheralyn Boatman RDCS Referring Phys:  Tonita Phoenix VU Diagnosing Phys: Chilton Si MD  Sonographer Comments: Technically difficult study due to poor echo windows. Image acquisition challenging due to patient body habitus. IMPRESSIONS  1. Anteroseptal hypokinesis. Left ventricular ejection fraction, by estimation, is 45 to 50%. The left ventricle has mildly decreased function. The left ventricle demonstrates regional wall motion abnormalities (see scoring diagram/findings for description). There is moderate concentric left ventricular hypertrophy.  2. Right ventricular systolic function is mildly reduced. The right ventricular size is moderately enlarged. There is mildly elevated pulmonary artery systolic pressure.  3. The mitral valve is abnormal.  4. There is a large (2.2 x 2.0 cm) mobile vegetation on the anterior leaflet. The tricuspid valve is abnormal. Tricuspid valve regurgitation is severe.  5. The aortic valve is  tricuspid.  6. The inferior vena cava is dilated in size with <50% respiratory variability, suggesting right atrial pressure of 15 mmHg. FINDINGS  Left Ventricle: Anteroseptal hypokinesis. Left ventricular ejection fraction, by estimation, is 45 to 50%. The left ventricle has mildly decreased function. The left ventricle demonstrates regional wall motion abnormalities. There is moderate concentric  left ventricular hypertrophy. Right Ventricle: The right ventricular size is moderately enlarged. Right ventricular systolic function is mildly reduced. There is mildly elevated pulmonary artery systolic pressure. The tricuspid regurgitant velocity is 2.42 m/s, and with an assumed right atrial pressure of 15 mmHg, the estimated right ventricular systolic pressure  is 38.4 mmHg. Mitral Valve: The mitral valve is abnormal. Tricuspid Valve: There is a large (2.2 x 2.0 cm) mobile vegetation on the anterior leaflet. The tricuspid valve is abnormal. Tricuspid valve regurgitation is severe. No evidence of tricuspid stenosis. Aortic Valve: The aortic valve is tricuspid. Venous: The inferior vena cava is dilated in size with less than 50% respiratory variability, suggesting right atrial pressure of 15 mmHg. LEFT VENTRICLE PLAX 2D LVIDd:         3.15 cm LVIDs:         2.40 cm LV PW:         1.65 cm LV IVS:        1.50 cm  IVC IVC diam: 3.00 cm LEFT ATRIUM         Index LA diam:    3.20 cm 1.67 cm/m   AORTA Ao Root diam: 3.15 cm Ao Asc diam:  3.10 cm TRICUSPID VALVE TR Peak grad:   23.4 mmHg TR Vmax:        242.00 cm/s Chilton Si MD Electronically signed by Chilton Si MD Signature Date/Time: 11/23/2021/5:56:24 PM    Final (Updated)    CT CHEST WO CONTRAST  Result Date: 11/23/2021 CLINICAL DATA:  Pneumonia, complication suspected, xray done chest pain ongoing, septic pulm emboli finishing up treatment EXAM: CT CHEST WITHOUT CONTRAST TECHNIQUE: Multidetector CT imaging of the chest was performed following the standard  protocol without IV contrast. RADIATION DOSE REDUCTION: This exam was performed according to the departmental dose-optimization program which includes automated exposure control, adjustment of the mA and/or kV according to patient size and/or use of iterative reconstruction technique. COMPARISON:  11/08/2021 FINDINGS: Cardiovascular: Heart is normal size. Aorta is normal caliber. Left PICC line in place with the tip in the SVC. Mediastinum/Nodes: No mediastinal, hilar, or axillary adenopathy. Trachea and esophagus are unremarkable. Thyroid unremarkable. Lungs/Pleura: Bilateral nodular airspace disease. This has progressed throughout the right lung since prior study, while slightly improved in the left lung since prior study. Small right pleural effusion and trace left effusion, new since prior study. Upper Abdomen: No acute findings Musculoskeletal: Chest wall soft tissues are unremarkable. No acute bony abnormality. IMPRESSION: Bilateral nodular airspace disease compatible with septic emboli or pneumonia. This is worsening in the right lung since prior study and improving on the left. Small right pleural effusion and trace left pleural effusion. Electronically Signed   By: Charlett Nose M.D.   On: 11/23/2021 17:40        Scheduled Meds:  amLODipine  5 mg Oral Daily   Chlorhexidine Gluconate Cloth  6 each Topical Daily   dabigatran  150 mg Oral Q12H   feeding supplement  1 Container Oral BID BM   ferrous gluconate  324 mg Oral Q breakfast   [START ON 11/26/2021] fluconazole  400 mg Oral Daily   gabapentin  400 mg Oral TID   [START ON 11/26/2021] linezolid  600 mg Oral Q12H   melatonin  3 mg Oral QHS   pantoprazole  40 mg Oral Daily   polyethylene glycol  17 g Oral Daily   senna-docusate  1 tablet Oral QHS   sodium chloride flush  10-40 mL Intracatheter Q12H   Continuous Infusions:  sodium chloride 10 mL/hr at 11/21/21 0354   micafungin (MYCAMINE) 150 mg in sodium chloride 0.9 % 100 mL IVPB 150 mg  (11/24/21 1520)   vancomycin 750 mg (11/25/21 0936)     LOS: 45 days    Time spent: 25 minutes  Barb Merino, MD [facility] Pager [phone]

## 2021-11-26 DIAGNOSIS — F1910 Other psychoactive substance abuse, uncomplicated: Secondary | ICD-10-CM | POA: Diagnosis not present

## 2021-11-26 DIAGNOSIS — I2692 Saddle embolus of pulmonary artery without acute cor pulmonale: Secondary | ICD-10-CM | POA: Diagnosis not present

## 2021-11-26 DIAGNOSIS — I079 Rheumatic tricuspid valve disease, unspecified: Secondary | ICD-10-CM | POA: Diagnosis not present

## 2021-11-26 DIAGNOSIS — B377 Candidal sepsis: Secondary | ICD-10-CM | POA: Diagnosis not present

## 2021-11-26 MED ORDER — OXYCODONE-ACETAMINOPHEN 5-325 MG PO TABS
1.0000 | ORAL_TABLET | Freq: Three times a day (TID) | ORAL | 0 refills | Status: AC | PRN
Start: 2021-11-26 — End: 2021-11-29

## 2021-11-26 MED ORDER — GABAPENTIN 400 MG PO CAPS: 400.0000 mg | ORAL_CAPSULE | Freq: Three times a day (TID) | ORAL | 2 refills | Status: DC

## 2021-11-26 MED ORDER — FERROUS GLUCONATE 324 (38 FE) MG PO TABS: 324.0000 mg | ORAL_TABLET | Freq: Every day | ORAL | 2 refills | Status: DC

## 2021-11-26 NOTE — Discharge Summary (Signed)
Physician Discharge Summary  Latoya [name] YNW:[id number] DOB: 1985-12-28 DOA: 10/11/2021  PCP: Pcp, No  Admit date: 10/11/2021 Discharge date: 11/26/2021  Admitted From: Home Disposition: Home  Recommendations for Outpatient Follow-up:  Follow up with PCP in 1-2 weeks ID clinic will schedule follow-up. Cardiothoracic surgery, referral sent for follow-up.  Home Health: N/A Equipment/Devices: N/A  Discharge Condition: Stable CODE STATUS: Full code Diet recommendation: Low-salt diet  Discharge summary: 36 year old female past medical history of intravenous drug abuse (now reports abstinence),  MSSA tricuspid valve endocarditis in 2020 (Treated with angiovac and course of Cefazolin), MRSA L4-L5 facet join septic discitis in 07/2019, MRSA bacteremia with infective endocarditis and tricuspid valve vegetation diagnosed 07/24/21 who presented as a transfer from [facility] for saddle pulmonary embolism.  Blood cultures drawn on 10/11/2021 have grown Candida albicans, with negative repeat culture on 10/14/2021.  MRI L-spine done on 10/07/2021 with findings concerning for septic arthritis.  Patient was found with tricuspid valve endocarditis, septic emboli in the lungs.  She remained in the hospital for more than 6 weeks completing IV antibiotics and Candida therapy as she was not able to be discharged home with indwelling IV line given history of injectable drug use.     Cardiothoracic surgery was consulted, noting evidence of PFO at previous Angiovac attempt, stating only option would be for surgical valve repair and PFO closure. They recommended outpatient follow up after sustained abstinence of illicit drugs.   Patient was continued on IV vancomycin and micafungin until 11/25/2021. ID recommended oral fluconazole due to concern for candida endocarditis for possible 6 to 12 months.  Case discussed with cardiothoracic surgery before discharge.  Since patient is stable she is able to go home.  They  would attempt valve surgery only after she has persistent abstinence from drug intake.  They will do multiple random urine drug testing before attempting surgery.  Multifactorial sepsis present on admission with candidemia, endocarditis, discitis: Clinically improved. Electrolytes: Replaced and normalized. Chronic pain syndrome: Patient remained on multiple opiates while in the hospital.  She has history of IV drug use.  Gradually weaned off from opiate, however is still using 15-20 mg of oxycodone.  Patient was prescribed 10 tablets of Percocet 5/325 to gradually wean off and to prevent from withdrawals at home.  This was also communicated with family.  She is on gabapentin 400 mg 3 times a day that she will continue.  Reportedly developed saddle embolism on Eliquis.  Initially treated with heparin.  After discussing with hematology, she was treated with Pradaxa with clinical improvement.  She will continue Pradaxa lifelong due to fatal emboli.   Patient remains clinically stable to discharge home with family.  Discharge Diagnoses:  Principal Problem:   Acute saddle pulmonary embolism (HCC) Active Problems:   Candidemia (HCC)   Endocarditis of tricuspid valve   Lumbar discitis   Pneumonia of left lower lobe due to infectious organism   MRSA bacteremia   IV drug abuse (HCC)   Hyponatremia   Chronic pain   Thrombocytopenia (HCC)   Pleuritic chest pain    Discharge Instructions  Discharge Instructions     Ambulatory referral to Cardiothoracic Surgery   Complete by: As directed    Call MD for:  severe uncontrolled pain   Complete by: As directed    Call MD for:  temperature >100.4   Complete by: As directed    Diet - low sodium heart healthy   Complete by: As directed    Discharge instructions  Complete by: As directed    Wean off all the narcotic medications you are taking .   Increase activity slowly   Complete by: As directed       Allergies as of 11/26/2021        Reactions   Naltrexone Anxiety   Severe anxiety Restlessness  Vomiting         Medication List     STOP taking these medications    Eliquis DVT/PE Starter Pack Generic drug: Apixaban Starter Pack (10mg  and 5mg )   pregabalin 75 MG capsule Commonly known as: LYRICA       TAKE these medications    acetaminophen 325 MG tablet Commonly known as: TYLENOL Take 650 mg by mouth every 6 (six) hours as needed for mild pain.   amLODipine 5 MG tablet Commonly known as: NORVASC Take 1 tablet (5 mg total) by mouth daily.   ferrous gluconate 324 MG tablet Commonly known as: FERGON Take 1 tablet (324 mg total) by mouth daily with breakfast.   fluconazole 200 MG tablet Commonly known as: DIFLUCAN Take 2 tablets (400 mg total) by mouth daily.   gabapentin 600 MG tablet Commonly known as: NEURONTIN Take 0.5 tablets (300 mg total) by mouth 3 (three) times daily.   linezolid 600 MG tablet Commonly known as: ZYVOX Take 1 tablet (600 mg total) by mouth every 12 (twelve) hours for 14 days. What changed: when to take this   oxyCODONE-acetaminophen 5-325 MG tablet Commonly known as: PERCOCET/ROXICET Take 1 tablet by mouth every 8 (eight) hours as needed for up to 3 days for severe pain.   pantoprazole 40 MG tablet Commonly known as: PROTONIX Take 1 tablet (40 mg total) by mouth daily.   Pradaxa 150 MG Caps capsule Generic drug: dabigatran Take 1 capsule (150 mg total) by mouth 2 (two) times daily.        Follow-up Information     , MD. Go on 12/01/2021.   Specialty: Infectious Diseases Why: appointment at 3:45pm Contact information: [address] [place] [address] [place] [phone]                Allergies  Allergen Reactions   Naltrexone Anxiety    Severe anxiety Restlessness  Vomiting     Consultations: Infectious disease CT CS   Procedures/Studies: ECHOCARDIOGRAM LIMITED  Result Date: 11/24/2021    ECHOCARDIOGRAM LIMITED  REPORT   Patient Name:   Latoya [name] Date of Exam: 11/23/2021 Medical Rec #:  Latoya [name]      Height:       66.0 in Accession #:    11/25/2021     Weight:       182.1 lb Date of Birth:  [birth year]     BSA:          1.922 m Patient Age:    35 years       BP:           120/103 mmHg Patient Gender: F              HR:           96 bpm. Exam Location:  Inpatient Procedure: Limited Echo, 3D Echo, Cardiac Doppler and Color Doppler REPORT CONTAINS CRITICAL RESULT                            MODIFIED REPORT: This report was modified by [id number] MD on 11/24/2021 due to TR.  Indications:     Endocarditis  History:         Patient has prior history of Echocardiogram examinations, most                  recent 10/09/2021. Signs/Symptoms:Chest Pain, Shortness of Breath                  and Dyspnea. Tricuspid endocarditis.  Sonographer:     Sheralyn Boatman RDCS Referring Phys:  Tonita Phoenix VU Diagnosing Phys: Chilton Si MD  Sonographer Comments: Technically difficult study due to poor echo windows. Image acquisition challenging due to patient body habitus. IMPRESSIONS  1. Anteroseptal hypokinesis. Left ventricular ejection fraction, by estimation, is 45 to 50%. The left ventricle has mildly decreased function. The left ventricle demonstrates regional wall motion abnormalities (see scoring diagram/findings for description). There is moderate concentric left ventricular hypertrophy.  2. Right ventricular systolic function is mildly reduced. The right ventricular size is moderately enlarged. There is mildly elevated pulmonary artery systolic pressure.  3. The mitral valve is abnormal.  4. There is a large (2.2 x 2.0 cm) mobile vegetation on the anterior leaflet. The tricuspid valve is abnormal. Tricuspid valve regurgitation is severe.  5. The aortic valve is tricuspid.  6. The inferior vena cava is dilated in size with <50% respiratory variability, suggesting right atrial pressure of 15 mmHg. FINDINGS  Left Ventricle: Anteroseptal  hypokinesis. Left ventricular ejection fraction, by estimation, is 45 to 50%. The left ventricle has mildly decreased function. The left ventricle demonstrates regional wall motion abnormalities. There is moderate concentric  left ventricular hypertrophy. Right Ventricle: The right ventricular size is moderately enlarged. Right ventricular systolic function is mildly reduced. There is mildly elevated pulmonary artery systolic pressure. The tricuspid regurgitant velocity is 2.42 m/s, and with an assumed right atrial pressure of 15 mmHg, the estimated right ventricular systolic pressure is 38.4 mmHg. Mitral Valve: The mitral valve is abnormal. Tricuspid Valve: There is a large (2.2 x 2.0 cm) mobile vegetation on the anterior leaflet. The tricuspid valve is abnormal. Tricuspid valve regurgitation is severe. No evidence of tricuspid stenosis. Aortic Valve: The aortic valve is tricuspid. Venous: The inferior vena cava is dilated in size with less than 50% respiratory variability, suggesting right atrial pressure of 15 mmHg. LEFT VENTRICLE PLAX 2D LVIDd:         3.15 cm LVIDs:         2.40 cm LV PW:         1.65 cm LV IVS:        1.50 cm  IVC IVC diam: 3.00 cm LEFT ATRIUM         Index LA diam:    3.20 cm 1.67 cm/m   AORTA Ao Root diam: 3.15 cm Ao Asc diam:  3.10 cm TRICUSPID VALVE TR Peak grad:   23.4 mmHg TR Vmax:        242.00 cm/s Chilton Si MD Electronically signed by Chilton Si MD Signature Date/Time: 11/23/2021/5:56:24 PM    Final (Updated)    CT CHEST WO CONTRAST  Result Date: 11/23/2021 CLINICAL DATA:  Pneumonia, complication suspected, xray done chest pain ongoing, septic pulm emboli finishing up treatment EXAM: CT CHEST WITHOUT CONTRAST TECHNIQUE: Multidetector CT imaging of the chest was performed following the standard protocol without IV contrast. RADIATION DOSE REDUCTION: This exam was performed according to the departmental dose-optimization program which includes automated exposure  control, adjustment of the mA and/or kV according to patient size and/or use of  iterative reconstruction technique. COMPARISON:  11/08/2021 FINDINGS: Cardiovascular: Heart is normal size. Aorta is normal caliber. Left PICC line in place with the tip in the SVC. Mediastinum/Nodes: No mediastinal, hilar, or axillary adenopathy. Trachea and esophagus are unremarkable. Thyroid unremarkable. Lungs/Pleura: Bilateral nodular airspace disease. This has progressed throughout the right lung since prior study, while slightly improved in the left lung since prior study. Small right pleural effusion and trace left effusion, new since prior study. Upper Abdomen: No acute findings Musculoskeletal: Chest wall soft tissues are unremarkable. No acute bony abnormality. IMPRESSION: Bilateral nodular airspace disease compatible with septic emboli or pneumonia. This is worsening in the right lung since prior study and improving on the left. Small right pleural effusion and trace left pleural effusion. Electronically Signed   By: Charlett Nose M.D.   On: 11/23/2021 17:40   DG Chest 2 View  Result Date: 11/17/2021 CLINICAL DATA:  Cough EXAM: CHEST - 2 VIEW COMPARISON:  Previous studies including the examination of 11/02/2021 FINDINGS: Transverse diameter of the heart is increased. There are patchy infiltrates in both parahilar regions, more so on the right side. There is interval decrease in infiltrates in right parahilar region and left lower lung field. No new focal pulmonary consolidation is seen. There are no signs of alveolar pulmonary edema. There is minimal blunting of lateral CP angles. There is no pneumothorax. Tip of PICC line is seen in the region of the superior vena cava. IMPRESSION: There is interval partial clearing of infiltrates in both lungs suggesting resolving pneumonia. Still, there are residual patchy infiltrates in both perihilar regions, more so on the right side suggesting residual multifocal pneumonia. No new  focal pulmonary consolidation is seen. Possible minimal bilateral pleural effusions. Electronically Signed   By: Ernie Avena M.D.   On: 11/17/2021 15:45   IR PICC REPLACEMENT LEFT INC IMG GUIDE  Result Date: 11/10/2021 INDICATION: Malfunctioning PICC line EXAM: FLUOROSCOPIC GUIDED PICC LINE EXCHANGE MEDICATIONS: None. CONTRAST:  None ANESTHESIA/SEDATION: None FLUOROSCOPY TIME:  Radiation Exposure Index (as provided by the fluoroscopic device): 1 mGy Kerma COMPLICATIONS: None immediate. TECHNIQUE: The procedure, risks, benefits, and alternatives were explained to the patient and informed written consent was obtained. The left upper extremity and external portion of the existing PICC line was prepped with chlorhexidine in a sterile fashion, and a sterile drape was applied covering the operative field. Maximum barrier sterile technique with sterile gowns and gloves were used for the procedure. A timeout was performed prior to the initiation of the procedure. The existing PICC line was cannulated with an 0.0018 wire which was advanced through the catheter. The catheter was exchanged for a peel-away sheath, ultimately allowing advancement of a 37 -cm, 5 - [place], double lumen PICC line to the level of the superior caval atrial junction. A post procedure spot fluoroscopic image was obtained. The catheter easily aspirated and flushed and was secured in place. A dressing was placed. The patient tolerated the procedure well without immediate post procedural complication. FINDINGS: After catheter exchange, the tip lies within the superior cavoatrial junction. The catheter aspirates and flushes normally and is ready for immediate use. IMPRESSION: Successful fluoroscopic guided exchange of left upper extremity approach 37 cm, 5 - [place], double lumen PICC with tip overlying the superior caval atrial junction. The PICC line is ready for immediate use. Procedure performed by Boisseau, PA. Electronically Signed   By:  Simonne Come M.D.   On: 11/10/2021 17:35   DG Chest Port 1 View  Result Date:  11/02/2021 CLINICAL DATA:  Shortness of breath, cough EXAM: PORTABLE CHEST 1 VIEW COMPARISON:  Previous studies including the examination of 10/30/2018 FINDINGS: Transverse diameter of heart is increased. Central pulmonary vessels are less prominent. There is decrease in interstitial markings in the parahilar regions. There are patchy alveolar infiltrates in the right upper lobe with interval worsening. There are small bilateral pleural effusions are seen. There is no pneumothorax. Tip of PICC line is seen in the superior vena cava. Patchy infiltrates in the left parahilar region and left lower lung fields with interval worsening IMPRESSION: Cardiomegaly. There is interval decrease in pulmonary vascular congestion. There are patchy alveolar infiltrates in the right upper lobe, left mid lung fields and left lower lung fields with interval worsening suggesting multifocal pneumonia. Small bilateral pleural effusions are seen. Electronically Signed   By: Ernie Avena M.D.   On: 11/02/2021 09:20   DG Chest 1 View  Result Date: 10/29/2021 CLINICAL DATA:  Follow-up right lung opacities EXAM: CHEST  1 VIEW COMPARISON:  Chest radiograph dated October 24, 2021 FINDINGS: The heart size and mediastinal contours are within normal limits. Right hilar and lower lobe hazy opacity suggesting airspace disease, unchanged. Interval development of the hazy opacities in the left mid lung. The visualized skeletal structures are unremarkable. Left PICC with distal tip in the SVC. IMPRESSION: 1.   Left PICC with distal tip in the SVC. 2. Right hilar and lower lobe opacities and new opacities in the left mid lung, concerning for airspace disease. Electronically Signed   By: Larose Hires D.O.   On: 10/29/2021 13:27   (Echo, Carotid, EGD, Colonoscopy, ERCP)    Subjective: Patient seen in the morning rounds.  She denied any complaints.  We did discuss  about ongoing use of oxycodone that she has been prescribed in the hospital and gradually tapering off of the medications and she is agreeable.   Discharge Exam: Vitals:   11/26/21 0700 11/26/21 1034  BP: (!) 141/100 (!) 135/98  Pulse: 90   Resp: 20   Temp: 98 F (36.7 C)   SpO2: 97%    Vitals:   11/25/21 1500 11/25/21 2007 11/26/21 0700 11/26/21 1034  BP: (!) 141/98 (!) 128/97 (!) 141/100 (!) 135/98  Pulse:  97 90   Resp: Temp: 98.3 F (36.8 C) 98.5 F (36.9 C) 98 F (36.7 C)   TempSrc: Oral Oral Oral   SpO2:  99% 97%   Weight:      Height:        General: Pt is alert, awake, not in acute distress On room air.  Pleasant and interactive. Cardiovascular: RRR, S1/S2 +, no rubs, no gallops Respiratory: CTA bilaterally, no wheezing, no rhonchi Abdominal: Soft, NT, ND, bowel sounds + Extremities: no edema, no cyanosis    The results of significant diagnostics from this hospitalization (including imaging, microbiology, ancillary and laboratory) are listed below for reference.     Microbiology: No results found for this or any previous visit (from the past 240 hour(s)).   Labs: BNP (last 3 results) Recent Labs    10/22/21 0500 10/24/21 0241 11/02/21 0436  BNP 208.9* 173.1* 464.1*   Basic Metabolic Panel: Recent Labs  Lab 11/20/21 0002  NA 135  K 3.9  CL 106  CO2 21*  GLUCOSE 95  BUN 8  CREATININE 0.56  CALCIUM 8.4*   Liver Function Tests: No results for input(s): "AST", "ALT", "ALKPHOS", "BILITOT", "PROT", "ALBUMIN" in the last 168 hours. No  results for input(s): "LIPASE", "AMYLASE" in the last 168 hours. No results for input(s): "AMMONIA" in the last 168 hours. CBC: Recent Labs  Lab 11/20/21 0002 11/23/21 0516  WBC 9.0 8.7  HGB 11.1* 11.4*  HCT 39.1 39.3  MCV 90.1 88.1  PLT 273 297   Cardiac Enzymes: No results for input(s): "CKTOTAL", "CKMB", "CKMBINDEX", "TROPONINI" in the last 168 hours. BNP: Invalid input(s):  "POCBNP" CBG: No results for input(s): "GLUCAP" in the last 168 hours. D-Dimer No results for input(s): "DDIMER" in the last 72 hours. Hgb A1c No results for input(s): "HGBA1C" in the last 72 hours. Lipid Profile No results for input(s): "CHOL", "HDL", "LDLCALC", "TRIG", "CHOLHDL", "LDLDIRECT" in the last 72 hours. Thyroid function studies No results for input(s): "TSH", "T4TOTAL", "T3FREE", "THYROIDAB" in the last 72 hours.  Invalid input(s): "FREET3" Anemia work up No results for input(s): "VITAMINB12", "FOLATE", "FERRITIN", "TIBC", "IRON", "RETICCTPCT" in the last 72 hours. Urinalysis    Component Value Date/Time   COLORURINE STRAW (A) 10/22/2021 2324   APPEARANCEUR CLEAR 10/22/2021 2324   LABSPEC 1.004 (L) 10/22/2021 2324   PHURINE 6.0 10/22/2021 2324   GLUCOSEU NEGATIVE 10/22/2021 2324   HGBUR SMALL (A) 10/22/2021 2324   BILIRUBINUR NEGATIVE 10/22/2021 2324   BILIRUBINUR negative 06/21/2021 1755   KETONESUR NEGATIVE 10/22/2021 2324   PROTEINUR NEGATIVE 10/22/2021 2324   UROBILINOGEN 0.2 06/21/2021 1755   NITRITE NEGATIVE 10/22/2021 2324   LEUKOCYTESUR NEGATIVE 10/22/2021 2324   Sepsis Labs Recent Labs  Lab 11/20/21 0002 11/23/21 0516  WBC 9.0 8.7   Microbiology No results found for this or any previous visit (from the past 240 hour(s)).   Time coordinating discharge: 35 minutes  SIGNED:   Dorcas CarrowKuber Marielle Mantione, MD  [facility] 11/26/2021, 11:53 AM

## 2021-11-27 ENCOUNTER — Other Ambulatory Visit: Payer: Self-pay

## 2021-11-27 ENCOUNTER — Telehealth: Payer: Self-pay

## 2021-11-27 DIAGNOSIS — Z8679 Personal history of other diseases of the circulatory system: Secondary | ICD-10-CM

## 2021-11-27 DIAGNOSIS — J189 Pneumonia, unspecified organism: Secondary | ICD-10-CM

## 2021-11-27 NOTE — Telephone Encounter (Signed)
RNCM received call from patient stating she's having difficulty getting her medications (Percocet) from [facility].   This RNCM spoke with Kathlene November at [facility] [phone] who stated no problems with the prescription however they can't fill due to they have too many narcotic patients right now. Kathlene November explained to patient's father that he can take prescription to another pharmacy.   No additional TOC needs at this time.

## 2021-12-01 ENCOUNTER — Telehealth: Payer: Self-pay

## 2021-12-01 ENCOUNTER — Ambulatory Visit: Payer: Medicaid Other | Admitting: Internal Medicine

## 2021-12-01 DIAGNOSIS — J189 Pneumonia, unspecified organism: Secondary | ICD-10-CM | POA: Diagnosis not present

## 2021-12-01 DIAGNOSIS — I361 Nonrheumatic tricuspid (valve) insufficiency: Secondary | ICD-10-CM | POA: Diagnosis not present

## 2021-12-01 DIAGNOSIS — I2694 Multiple subsegmental pulmonary emboli without acute cor pulmonale: Secondary | ICD-10-CM | POA: Diagnosis not present

## 2021-12-01 DIAGNOSIS — Z8614 Personal history of Methicillin resistant Staphylococcus aureus infection: Secondary | ICD-10-CM | POA: Diagnosis not present

## 2021-12-01 DIAGNOSIS — I2782 Chronic pulmonary embolism: Secondary | ICD-10-CM | POA: Diagnosis not present

## 2021-12-01 DIAGNOSIS — I2609 Other pulmonary embolism with acute cor pulmonale: Secondary | ICD-10-CM | POA: Diagnosis not present

## 2021-12-01 DIAGNOSIS — R079 Chest pain, unspecified: Secondary | ICD-10-CM | POA: Diagnosis not present

## 2021-12-01 DIAGNOSIS — I2699 Other pulmonary embolism without acute cor pulmonale: Secondary | ICD-10-CM | POA: Diagnosis not present

## 2021-12-01 DIAGNOSIS — B182 Chronic viral hepatitis C: Secondary | ICD-10-CM | POA: Diagnosis not present

## 2021-12-01 DIAGNOSIS — I2721 Secondary pulmonary arterial hypertension: Secondary | ICD-10-CM | POA: Diagnosis not present

## 2021-12-01 DIAGNOSIS — Z79899 Other long term (current) drug therapy: Secondary | ICD-10-CM | POA: Diagnosis not present

## 2021-12-01 DIAGNOSIS — M79604 Pain in right leg: Secondary | ICD-10-CM | POA: Diagnosis not present

## 2021-12-01 DIAGNOSIS — I2690 Septic pulmonary embolism without acute cor pulmonale: Secondary | ICD-10-CM | POA: Diagnosis not present

## 2021-12-01 DIAGNOSIS — I079 Rheumatic tricuspid valve disease, unspecified: Secondary | ICD-10-CM | POA: Diagnosis not present

## 2021-12-01 DIAGNOSIS — M7989 Other specified soft tissue disorders: Secondary | ICD-10-CM | POA: Diagnosis not present

## 2021-12-01 DIAGNOSIS — M79605 Pain in left leg: Secondary | ICD-10-CM | POA: Diagnosis not present

## 2021-12-01 NOTE — Telephone Encounter (Signed)
Checked on patient and her mother answered and states they are at [facility] ED. Will cancel todays visit.

## 2021-12-01 NOTE — Telephone Encounter (Signed)
Spoke with both patient and patient's mother. They called for advice - patient stated she is experiencing severe chest pain ("feels like heart coming out of chest") that began unexpectedly this morning. States pain is 7/10 and localized at her heart; denied any radiation or numbness/tingling. Stated her "lungs really hurt" and endorsed shortness of breath/difficulty breathing.  Advised patient to go to the ED. Patient confirmed that her mother could drive her.   Will route to provider.  Wyvonne Lenz, RN

## 2021-12-01 NOTE — Telephone Encounter (Signed)
Thanks

## 2021-12-02 DIAGNOSIS — Z888 Allergy status to other drugs, medicaments and biological substances status: Secondary | ICD-10-CM | POA: Diagnosis not present

## 2021-12-02 DIAGNOSIS — I110 Hypertensive heart disease with heart failure: Secondary | ICD-10-CM | POA: Diagnosis not present

## 2021-12-02 DIAGNOSIS — I50810 Right heart failure, unspecified: Secondary | ICD-10-CM | POA: Diagnosis not present

## 2021-12-02 DIAGNOSIS — Z8614 Personal history of Methicillin resistant Staphylococcus aureus infection: Secondary | ICD-10-CM | POA: Diagnosis not present

## 2021-12-02 DIAGNOSIS — I2609 Other pulmonary embolism with acute cor pulmonale: Secondary | ICD-10-CM | POA: Diagnosis not present

## 2021-12-02 DIAGNOSIS — B182 Chronic viral hepatitis C: Secondary | ICD-10-CM | POA: Diagnosis not present

## 2021-12-02 DIAGNOSIS — I079 Rheumatic tricuspid valve disease, unspecified: Secondary | ICD-10-CM | POA: Diagnosis not present

## 2021-12-02 DIAGNOSIS — I2690 Septic pulmonary embolism without acute cor pulmonale: Secondary | ICD-10-CM | POA: Diagnosis not present

## 2021-12-02 DIAGNOSIS — Z79899 Other long term (current) drug therapy: Secondary | ICD-10-CM | POA: Diagnosis not present

## 2021-12-02 DIAGNOSIS — I38 Endocarditis, valve unspecified: Secondary | ICD-10-CM | POA: Diagnosis not present

## 2021-12-02 DIAGNOSIS — J90 Pleural effusion, not elsewhere classified: Secondary | ICD-10-CM | POA: Diagnosis not present

## 2021-12-02 DIAGNOSIS — I2782 Chronic pulmonary embolism: Secondary | ICD-10-CM | POA: Diagnosis not present

## 2021-12-02 DIAGNOSIS — B1920 Unspecified viral hepatitis C without hepatic coma: Secondary | ICD-10-CM | POA: Diagnosis not present

## 2021-12-02 DIAGNOSIS — I071 Rheumatic tricuspid insufficiency: Secondary | ICD-10-CM | POA: Diagnosis not present

## 2021-12-02 DIAGNOSIS — I272 Pulmonary hypertension, unspecified: Secondary | ICD-10-CM | POA: Diagnosis not present

## 2021-12-02 DIAGNOSIS — I2601 Septic pulmonary embolism with acute cor pulmonale: Secondary | ICD-10-CM | POA: Diagnosis not present

## 2021-12-02 DIAGNOSIS — F1910 Other psychoactive substance abuse, uncomplicated: Secondary | ICD-10-CM | POA: Diagnosis not present

## 2021-12-02 DIAGNOSIS — I339 Acute and subacute endocarditis, unspecified: Secondary | ICD-10-CM | POA: Diagnosis not present

## 2021-12-02 DIAGNOSIS — I2699 Other pulmonary embolism without acute cor pulmonale: Secondary | ICD-10-CM | POA: Diagnosis not present

## 2021-12-02 DIAGNOSIS — M549 Dorsalgia, unspecified: Secondary | ICD-10-CM | POA: Diagnosis not present

## 2021-12-02 DIAGNOSIS — Z87891 Personal history of nicotine dependence: Secondary | ICD-10-CM | POA: Diagnosis not present

## 2021-12-02 DIAGNOSIS — I2721 Secondary pulmonary arterial hypertension: Secondary | ICD-10-CM | POA: Diagnosis not present

## 2021-12-03 DIAGNOSIS — I330 Acute and subacute infective endocarditis: Secondary | ICD-10-CM | POA: Diagnosis not present

## 2021-12-03 DIAGNOSIS — M549 Dorsalgia, unspecified: Secondary | ICD-10-CM | POA: Diagnosis not present

## 2021-12-03 DIAGNOSIS — I2609 Other pulmonary embolism with acute cor pulmonale: Secondary | ICD-10-CM | POA: Diagnosis not present

## 2021-12-03 DIAGNOSIS — B376 Candidal endocarditis: Secondary | ICD-10-CM | POA: Diagnosis not present

## 2021-12-03 DIAGNOSIS — B9562 Methicillin resistant Staphylococcus aureus infection as the cause of diseases classified elsewhere: Secondary | ICD-10-CM | POA: Diagnosis not present

## 2021-12-03 DIAGNOSIS — M47816 Spondylosis without myelopathy or radiculopathy, lumbar region: Secondary | ICD-10-CM | POA: Diagnosis not present

## 2021-12-04 DIAGNOSIS — I361 Nonrheumatic tricuspid (valve) insufficiency: Secondary | ICD-10-CM | POA: Diagnosis not present

## 2021-12-04 DIAGNOSIS — I2690 Septic pulmonary embolism without acute cor pulmonale: Secondary | ICD-10-CM | POA: Diagnosis not present

## 2021-12-04 DIAGNOSIS — I330 Acute and subacute infective endocarditis: Secondary | ICD-10-CM | POA: Diagnosis not present

## 2021-12-04 DIAGNOSIS — I2699 Other pulmonary embolism without acute cor pulmonale: Secondary | ICD-10-CM | POA: Diagnosis not present

## 2021-12-04 DIAGNOSIS — J90 Pleural effusion, not elsewhere classified: Secondary | ICD-10-CM | POA: Diagnosis not present

## 2021-12-04 DIAGNOSIS — I2601 Septic pulmonary embolism with acute cor pulmonale: Secondary | ICD-10-CM | POA: Diagnosis not present

## 2021-12-04 DIAGNOSIS — I071 Rheumatic tricuspid insufficiency: Secondary | ICD-10-CM | POA: Diagnosis not present

## 2021-12-04 DIAGNOSIS — I38 Endocarditis, valve unspecified: Secondary | ICD-10-CM | POA: Diagnosis not present

## 2021-12-04 DIAGNOSIS — I871 Compression of vein: Secondary | ICD-10-CM | POA: Diagnosis not present

## 2021-12-05 DIAGNOSIS — I2601 Septic pulmonary embolism with acute cor pulmonale: Secondary | ICD-10-CM | POA: Diagnosis not present

## 2021-12-05 DIAGNOSIS — Z419 Encounter for procedure for purposes other than remedying health state, unspecified: Secondary | ICD-10-CM | POA: Diagnosis not present

## 2021-12-05 DIAGNOSIS — I272 Pulmonary hypertension, unspecified: Secondary | ICD-10-CM | POA: Diagnosis not present

## 2021-12-05 DIAGNOSIS — B376 Candidal endocarditis: Secondary | ICD-10-CM | POA: Diagnosis not present

## 2021-12-05 DIAGNOSIS — I361 Nonrheumatic tricuspid (valve) insufficiency: Secondary | ICD-10-CM | POA: Diagnosis not present

## 2021-12-05 DIAGNOSIS — I330 Acute and subacute infective endocarditis: Secondary | ICD-10-CM | POA: Diagnosis not present

## 2021-12-05 DIAGNOSIS — I2690 Septic pulmonary embolism without acute cor pulmonale: Secondary | ICD-10-CM | POA: Diagnosis not present

## 2021-12-05 DIAGNOSIS — B9562 Methicillin resistant Staphylococcus aureus infection as the cause of diseases classified elsewhere: Secondary | ICD-10-CM | POA: Diagnosis not present

## 2021-12-05 DIAGNOSIS — M4626 Osteomyelitis of vertebra, lumbar region: Secondary | ICD-10-CM | POA: Diagnosis not present

## 2021-12-05 DIAGNOSIS — I2699 Other pulmonary embolism without acute cor pulmonale: Secondary | ICD-10-CM | POA: Diagnosis not present

## 2021-12-05 DIAGNOSIS — J90 Pleural effusion, not elsewhere classified: Secondary | ICD-10-CM | POA: Diagnosis not present

## 2021-12-06 DIAGNOSIS — I071 Rheumatic tricuspid insufficiency: Secondary | ICD-10-CM | POA: Insufficient documentation

## 2021-12-06 DIAGNOSIS — I2601 Septic pulmonary embolism with acute cor pulmonale: Secondary | ICD-10-CM | POA: Diagnosis not present

## 2021-12-06 DIAGNOSIS — B376 Candidal endocarditis: Secondary | ICD-10-CM | POA: Diagnosis not present

## 2021-12-06 DIAGNOSIS — I272 Pulmonary hypertension, unspecified: Secondary | ICD-10-CM | POA: Diagnosis not present

## 2021-12-06 DIAGNOSIS — I361 Nonrheumatic tricuspid (valve) insufficiency: Secondary | ICD-10-CM | POA: Diagnosis not present

## 2021-12-06 DIAGNOSIS — J90 Pleural effusion, not elsewhere classified: Secondary | ICD-10-CM | POA: Diagnosis not present

## 2021-12-06 DIAGNOSIS — I330 Acute and subacute infective endocarditis: Secondary | ICD-10-CM | POA: Diagnosis not present

## 2021-12-06 DIAGNOSIS — B1920 Unspecified viral hepatitis C without hepatic coma: Secondary | ICD-10-CM | POA: Diagnosis not present

## 2021-12-06 DIAGNOSIS — M4626 Osteomyelitis of vertebra, lumbar region: Secondary | ICD-10-CM | POA: Diagnosis not present

## 2021-12-06 DIAGNOSIS — B9562 Methicillin resistant Staphylococcus aureus infection as the cause of diseases classified elsewhere: Secondary | ICD-10-CM | POA: Diagnosis not present

## 2021-12-06 DIAGNOSIS — I2690 Septic pulmonary embolism without acute cor pulmonale: Secondary | ICD-10-CM | POA: Diagnosis not present

## 2021-12-06 HISTORY — DX: Pulmonary hypertension, unspecified: I27.20

## 2021-12-06 HISTORY — DX: Unspecified viral hepatitis C without hepatic coma: B19.20

## 2021-12-06 HISTORY — DX: Pleural effusion, not elsewhere classified: J90

## 2021-12-06 HISTORY — DX: Rheumatic tricuspid insufficiency: I07.1

## 2021-12-07 DIAGNOSIS — I2690 Septic pulmonary embolism without acute cor pulmonale: Secondary | ICD-10-CM | POA: Diagnosis not present

## 2021-12-07 DIAGNOSIS — I2601 Septic pulmonary embolism with acute cor pulmonale: Secondary | ICD-10-CM | POA: Diagnosis not present

## 2021-12-07 DIAGNOSIS — I071 Rheumatic tricuspid insufficiency: Secondary | ICD-10-CM | POA: Diagnosis not present

## 2021-12-07 DIAGNOSIS — I361 Nonrheumatic tricuspid (valve) insufficiency: Secondary | ICD-10-CM | POA: Diagnosis not present

## 2021-12-07 DIAGNOSIS — I330 Acute and subacute infective endocarditis: Secondary | ICD-10-CM | POA: Diagnosis not present

## 2021-12-07 DIAGNOSIS — J90 Pleural effusion, not elsewhere classified: Secondary | ICD-10-CM | POA: Diagnosis not present

## 2021-12-08 DIAGNOSIS — I361 Nonrheumatic tricuspid (valve) insufficiency: Secondary | ICD-10-CM | POA: Diagnosis not present

## 2021-12-08 DIAGNOSIS — I330 Acute and subacute infective endocarditis: Secondary | ICD-10-CM | POA: Diagnosis not present

## 2021-12-08 DIAGNOSIS — M4626 Osteomyelitis of vertebra, lumbar region: Secondary | ICD-10-CM | POA: Diagnosis not present

## 2021-12-08 DIAGNOSIS — J90 Pleural effusion, not elsewhere classified: Secondary | ICD-10-CM | POA: Diagnosis not present

## 2021-12-08 DIAGNOSIS — I2690 Septic pulmonary embolism without acute cor pulmonale: Secondary | ICD-10-CM | POA: Diagnosis not present

## 2021-12-08 DIAGNOSIS — B376 Candidal endocarditis: Secondary | ICD-10-CM | POA: Diagnosis not present

## 2021-12-08 DIAGNOSIS — B9562 Methicillin resistant Staphylococcus aureus infection as the cause of diseases classified elsewhere: Secondary | ICD-10-CM | POA: Diagnosis not present

## 2021-12-08 DIAGNOSIS — I2601 Septic pulmonary embolism with acute cor pulmonale: Secondary | ICD-10-CM | POA: Diagnosis not present

## 2021-12-09 DIAGNOSIS — E873 Alkalosis: Secondary | ICD-10-CM | POA: Diagnosis not present

## 2021-12-09 DIAGNOSIS — J3801 Paralysis of vocal cords and larynx, unilateral: Secondary | ICD-10-CM | POA: Diagnosis not present

## 2021-12-09 DIAGNOSIS — I2782 Chronic pulmonary embolism: Secondary | ICD-10-CM | POA: Diagnosis not present

## 2021-12-09 DIAGNOSIS — B3789 Other sites of candidiasis: Secondary | ICD-10-CM | POA: Diagnosis not present

## 2021-12-09 DIAGNOSIS — I272 Pulmonary hypertension, unspecified: Secondary | ICD-10-CM | POA: Diagnosis not present

## 2021-12-09 DIAGNOSIS — I442 Atrioventricular block, complete: Secondary | ICD-10-CM | POA: Diagnosis not present

## 2021-12-09 DIAGNOSIS — I2724 Chronic thromboembolic pulmonary hypertension: Secondary | ICD-10-CM | POA: Diagnosis not present

## 2021-12-09 DIAGNOSIS — I2601 Septic pulmonary embolism with acute cor pulmonale: Secondary | ICD-10-CM | POA: Diagnosis not present

## 2021-12-09 DIAGNOSIS — E669 Obesity, unspecified: Secondary | ICD-10-CM | POA: Diagnosis not present

## 2021-12-09 DIAGNOSIS — I519 Heart disease, unspecified: Secondary | ICD-10-CM | POA: Diagnosis not present

## 2021-12-09 DIAGNOSIS — I330 Acute and subacute infective endocarditis: Secondary | ICD-10-CM | POA: Diagnosis not present

## 2021-12-09 DIAGNOSIS — D696 Thrombocytopenia, unspecified: Secondary | ICD-10-CM | POA: Diagnosis not present

## 2021-12-09 DIAGNOSIS — I2692 Saddle embolus of pulmonary artery without acute cor pulmonale: Secondary | ICD-10-CM | POA: Diagnosis not present

## 2021-12-09 DIAGNOSIS — I361 Nonrheumatic tricuspid (valve) insufficiency: Secondary | ICD-10-CM | POA: Diagnosis not present

## 2021-12-09 DIAGNOSIS — I2781 Cor pulmonale (chronic): Secondary | ICD-10-CM | POA: Diagnosis not present

## 2021-12-09 DIAGNOSIS — Q2112 Patent foramen ovale: Secondary | ICD-10-CM | POA: Diagnosis not present

## 2021-12-09 DIAGNOSIS — F1120 Opioid dependence, uncomplicated: Secondary | ICD-10-CM | POA: Diagnosis not present

## 2021-12-09 DIAGNOSIS — J90 Pleural effusion, not elsewhere classified: Secondary | ICD-10-CM | POA: Diagnosis not present

## 2021-12-09 DIAGNOSIS — B1920 Unspecified viral hepatitis C without hepatic coma: Secondary | ICD-10-CM | POA: Diagnosis not present

## 2021-12-10 DIAGNOSIS — B958 Unspecified staphylococcus as the cause of diseases classified elsewhere: Secondary | ICD-10-CM | POA: Diagnosis not present

## 2021-12-10 DIAGNOSIS — I2692 Saddle embolus of pulmonary artery without acute cor pulmonale: Secondary | ICD-10-CM | POA: Diagnosis not present

## 2021-12-10 DIAGNOSIS — I330 Acute and subacute infective endocarditis: Secondary | ICD-10-CM | POA: Diagnosis not present

## 2021-12-10 DIAGNOSIS — I2724 Chronic thromboembolic pulmonary hypertension: Secondary | ICD-10-CM | POA: Diagnosis not present

## 2021-12-11 DIAGNOSIS — I2724 Chronic thromboembolic pulmonary hypertension: Secondary | ICD-10-CM | POA: Diagnosis not present

## 2021-12-11 DIAGNOSIS — I510 Cardiac septal defect, acquired: Secondary | ICD-10-CM | POA: Diagnosis not present

## 2021-12-11 DIAGNOSIS — I288 Other diseases of pulmonary vessels: Secondary | ICD-10-CM | POA: Diagnosis not present

## 2021-12-11 DIAGNOSIS — I070 Rheumatic tricuspid stenosis: Secondary | ICD-10-CM | POA: Diagnosis not present

## 2021-12-11 DIAGNOSIS — I361 Nonrheumatic tricuspid (valve) insufficiency: Secondary | ICD-10-CM | POA: Diagnosis not present

## 2021-12-11 DIAGNOSIS — I079 Rheumatic tricuspid valve disease, unspecified: Secondary | ICD-10-CM | POA: Diagnosis not present

## 2021-12-11 DIAGNOSIS — I071 Rheumatic tricuspid insufficiency: Secondary | ICD-10-CM | POA: Diagnosis not present

## 2021-12-11 DIAGNOSIS — Q2112 Patent foramen ovale: Secondary | ICD-10-CM | POA: Diagnosis not present

## 2021-12-11 DIAGNOSIS — I330 Acute and subacute infective endocarditis: Secondary | ICD-10-CM | POA: Diagnosis not present

## 2021-12-11 DIAGNOSIS — I2692 Saddle embolus of pulmonary artery without acute cor pulmonale: Secondary | ICD-10-CM | POA: Diagnosis not present

## 2021-12-12 ENCOUNTER — Other Ambulatory Visit: Payer: Self-pay | Admitting: Obstetrics and Gynecology

## 2021-12-12 DIAGNOSIS — J811 Chronic pulmonary edema: Secondary | ICD-10-CM | POA: Diagnosis not present

## 2021-12-12 DIAGNOSIS — I442 Atrioventricular block, complete: Secondary | ICD-10-CM | POA: Diagnosis not present

## 2021-12-12 DIAGNOSIS — I330 Acute and subacute infective endocarditis: Secondary | ICD-10-CM | POA: Diagnosis not present

## 2021-12-12 DIAGNOSIS — Z4682 Encounter for fitting and adjustment of non-vascular catheter: Secondary | ICD-10-CM | POA: Diagnosis not present

## 2021-12-12 DIAGNOSIS — I2692 Saddle embolus of pulmonary artery without acute cor pulmonale: Secondary | ICD-10-CM | POA: Diagnosis not present

## 2021-12-12 DIAGNOSIS — G8918 Other acute postprocedural pain: Secondary | ICD-10-CM | POA: Diagnosis not present

## 2021-12-12 DIAGNOSIS — I272 Pulmonary hypertension, unspecified: Secondary | ICD-10-CM | POA: Diagnosis not present

## 2021-12-12 DIAGNOSIS — Z954 Presence of other heart-valve replacement: Secondary | ICD-10-CM | POA: Insufficient documentation

## 2021-12-12 DIAGNOSIS — M545 Low back pain, unspecified: Secondary | ICD-10-CM | POA: Diagnosis not present

## 2021-12-12 DIAGNOSIS — B9562 Methicillin resistant Staphylococcus aureus infection as the cause of diseases classified elsewhere: Secondary | ICD-10-CM | POA: Diagnosis not present

## 2021-12-12 DIAGNOSIS — I2724 Chronic thromboembolic pulmonary hypertension: Secondary | ICD-10-CM | POA: Diagnosis not present

## 2021-12-12 DIAGNOSIS — I519 Heart disease, unspecified: Secondary | ICD-10-CM | POA: Diagnosis not present

## 2021-12-12 DIAGNOSIS — I079 Rheumatic tricuspid valve disease, unspecified: Secondary | ICD-10-CM | POA: Diagnosis not present

## 2021-12-12 DIAGNOSIS — D72829 Elevated white blood cell count, unspecified: Secondary | ICD-10-CM

## 2021-12-12 DIAGNOSIS — B958 Unspecified staphylococcus as the cause of diseases classified elsewhere: Secondary | ICD-10-CM | POA: Diagnosis not present

## 2021-12-12 DIAGNOSIS — I368 Other nonrheumatic tricuspid valve disorders: Secondary | ICD-10-CM | POA: Diagnosis not present

## 2021-12-12 DIAGNOSIS — G8929 Other chronic pain: Secondary | ICD-10-CM | POA: Diagnosis not present

## 2021-12-12 HISTORY — DX: Elevated white blood cell count, unspecified: D72.829

## 2021-12-12 HISTORY — DX: Heart disease, unspecified: I51.9

## 2021-12-12 HISTORY — DX: Atrioventricular block, complete: I44.2

## 2021-12-12 HISTORY — DX: Other acute postprocedural pain: G89.18

## 2021-12-12 HISTORY — DX: Presence of other heart-valve replacement: Z95.4

## 2021-12-12 NOTE — Patient Outreach (Signed)
Care Coordination  12/12/2021  Latoya [name] 03/10/1986 [id number]  Latoya [name] is currently admitted as an inpatient at  [facility] . The [facility] Managed Care team will follow the progress of Latoya [name] and follow up upon discharge.   Kathi Der RN, BSN [place]  [employer] - Managed Medicaid High Risk [phone]

## 2021-12-13 DIAGNOSIS — B9562 Methicillin resistant Staphylococcus aureus infection as the cause of diseases classified elsewhere: Secondary | ICD-10-CM | POA: Diagnosis not present

## 2021-12-13 DIAGNOSIS — I368 Other nonrheumatic tricuspid valve disorders: Secondary | ICD-10-CM | POA: Diagnosis not present

## 2021-12-13 DIAGNOSIS — M545 Low back pain, unspecified: Secondary | ICD-10-CM | POA: Diagnosis not present

## 2021-12-13 DIAGNOSIS — J90 Pleural effusion, not elsewhere classified: Secondary | ICD-10-CM | POA: Diagnosis not present

## 2021-12-13 DIAGNOSIS — I272 Pulmonary hypertension, unspecified: Secondary | ICD-10-CM | POA: Diagnosis not present

## 2021-12-13 DIAGNOSIS — Z4682 Encounter for fitting and adjustment of non-vascular catheter: Secondary | ICD-10-CM | POA: Diagnosis not present

## 2021-12-13 DIAGNOSIS — I079 Rheumatic tricuspid valve disease, unspecified: Secondary | ICD-10-CM | POA: Diagnosis not present

## 2021-12-13 DIAGNOSIS — G8929 Other chronic pain: Secondary | ICD-10-CM | POA: Diagnosis not present

## 2021-12-13 DIAGNOSIS — J9811 Atelectasis: Secondary | ICD-10-CM | POA: Diagnosis not present

## 2021-12-13 DIAGNOSIS — J811 Chronic pulmonary edema: Secondary | ICD-10-CM | POA: Diagnosis not present

## 2021-12-13 DIAGNOSIS — I330 Acute and subacute infective endocarditis: Secondary | ICD-10-CM | POA: Diagnosis not present

## 2021-12-13 DIAGNOSIS — G8918 Other acute postprocedural pain: Secondary | ICD-10-CM | POA: Diagnosis not present

## 2021-12-13 DIAGNOSIS — I519 Heart disease, unspecified: Secondary | ICD-10-CM | POA: Diagnosis not present

## 2021-12-13 DIAGNOSIS — I442 Atrioventricular block, complete: Secondary | ICD-10-CM | POA: Diagnosis not present

## 2021-12-13 DIAGNOSIS — I2692 Saddle embolus of pulmonary artery without acute cor pulmonale: Secondary | ICD-10-CM | POA: Diagnosis not present

## 2021-12-14 DIAGNOSIS — I071 Rheumatic tricuspid insufficiency: Secondary | ICD-10-CM | POA: Diagnosis not present

## 2021-12-14 DIAGNOSIS — I519 Heart disease, unspecified: Secondary | ICD-10-CM | POA: Diagnosis not present

## 2021-12-14 DIAGNOSIS — I272 Pulmonary hypertension, unspecified: Secondary | ICD-10-CM | POA: Diagnosis not present

## 2021-12-14 DIAGNOSIS — R918 Other nonspecific abnormal finding of lung field: Secondary | ICD-10-CM | POA: Diagnosis not present

## 2021-12-14 DIAGNOSIS — Z4659 Encounter for fitting and adjustment of other gastrointestinal appliance and device: Secondary | ICD-10-CM | POA: Diagnosis not present

## 2021-12-14 DIAGNOSIS — J90 Pleural effusion, not elsewhere classified: Secondary | ICD-10-CM | POA: Diagnosis not present

## 2021-12-14 DIAGNOSIS — I079 Rheumatic tricuspid valve disease, unspecified: Secondary | ICD-10-CM | POA: Diagnosis not present

## 2021-12-14 DIAGNOSIS — I330 Acute and subacute infective endocarditis: Secondary | ICD-10-CM | POA: Diagnosis not present

## 2021-12-14 DIAGNOSIS — Z952 Presence of prosthetic heart valve: Secondary | ICD-10-CM | POA: Diagnosis not present

## 2021-12-14 DIAGNOSIS — Z452 Encounter for adjustment and management of vascular access device: Secondary | ICD-10-CM | POA: Diagnosis not present

## 2021-12-14 DIAGNOSIS — G8929 Other chronic pain: Secondary | ICD-10-CM | POA: Diagnosis not present

## 2021-12-14 DIAGNOSIS — G8918 Other acute postprocedural pain: Secondary | ICD-10-CM | POA: Diagnosis not present

## 2021-12-14 DIAGNOSIS — I2692 Saddle embolus of pulmonary artery without acute cor pulmonale: Secondary | ICD-10-CM | POA: Diagnosis not present

## 2021-12-14 DIAGNOSIS — I442 Atrioventricular block, complete: Secondary | ICD-10-CM | POA: Diagnosis not present

## 2021-12-14 DIAGNOSIS — I368 Other nonrheumatic tricuspid valve disorders: Secondary | ICD-10-CM | POA: Diagnosis not present

## 2021-12-14 DIAGNOSIS — M545 Low back pain, unspecified: Secondary | ICD-10-CM | POA: Diagnosis not present

## 2021-12-14 DIAGNOSIS — J811 Chronic pulmonary edema: Secondary | ICD-10-CM | POA: Diagnosis not present

## 2021-12-14 DIAGNOSIS — B9562 Methicillin resistant Staphylococcus aureus infection as the cause of diseases classified elsewhere: Secondary | ICD-10-CM | POA: Diagnosis not present

## 2021-12-15 DIAGNOSIS — I2692 Saddle embolus of pulmonary artery without acute cor pulmonale: Secondary | ICD-10-CM | POA: Diagnosis not present

## 2021-12-15 DIAGNOSIS — I079 Rheumatic tricuspid valve disease, unspecified: Secondary | ICD-10-CM | POA: Diagnosis not present

## 2021-12-15 DIAGNOSIS — J90 Pleural effusion, not elsewhere classified: Secondary | ICD-10-CM | POA: Diagnosis not present

## 2021-12-15 DIAGNOSIS — Z952 Presence of prosthetic heart valve: Secondary | ICD-10-CM | POA: Diagnosis not present

## 2021-12-15 DIAGNOSIS — I071 Rheumatic tricuspid insufficiency: Secondary | ICD-10-CM | POA: Diagnosis not present

## 2021-12-15 DIAGNOSIS — Z4659 Encounter for fitting and adjustment of other gastrointestinal appliance and device: Secondary | ICD-10-CM | POA: Diagnosis not present

## 2021-12-15 DIAGNOSIS — I330 Acute and subacute infective endocarditis: Secondary | ICD-10-CM | POA: Diagnosis not present

## 2021-12-16 DIAGNOSIS — R918 Other nonspecific abnormal finding of lung field: Secondary | ICD-10-CM | POA: Diagnosis not present

## 2021-12-16 DIAGNOSIS — Z954 Presence of other heart-valve replacement: Secondary | ICD-10-CM | POA: Diagnosis not present

## 2021-12-16 DIAGNOSIS — I071 Rheumatic tricuspid insufficiency: Secondary | ICD-10-CM | POA: Diagnosis not present

## 2021-12-16 DIAGNOSIS — G8918 Other acute postprocedural pain: Secondary | ICD-10-CM | POA: Diagnosis not present

## 2021-12-16 DIAGNOSIS — I330 Acute and subacute infective endocarditis: Secondary | ICD-10-CM | POA: Diagnosis not present

## 2021-12-16 DIAGNOSIS — I2692 Saddle embolus of pulmonary artery without acute cor pulmonale: Secondary | ICD-10-CM | POA: Diagnosis not present

## 2021-12-17 DIAGNOSIS — J90 Pleural effusion, not elsewhere classified: Secondary | ICD-10-CM | POA: Diagnosis not present

## 2021-12-17 DIAGNOSIS — I2692 Saddle embolus of pulmonary artery without acute cor pulmonale: Secondary | ICD-10-CM | POA: Diagnosis not present

## 2021-12-17 DIAGNOSIS — I330 Acute and subacute infective endocarditis: Secondary | ICD-10-CM | POA: Diagnosis not present

## 2021-12-17 DIAGNOSIS — Z952 Presence of prosthetic heart valve: Secondary | ICD-10-CM | POA: Diagnosis not present

## 2021-12-18 DIAGNOSIS — J811 Chronic pulmonary edema: Secondary | ICD-10-CM | POA: Diagnosis not present

## 2021-12-18 DIAGNOSIS — Z952 Presence of prosthetic heart valve: Secondary | ICD-10-CM | POA: Diagnosis not present

## 2021-12-18 DIAGNOSIS — G8918 Other acute postprocedural pain: Secondary | ICD-10-CM | POA: Diagnosis not present

## 2021-12-18 DIAGNOSIS — I2692 Saddle embolus of pulmonary artery without acute cor pulmonale: Secondary | ICD-10-CM | POA: Diagnosis not present

## 2021-12-18 DIAGNOSIS — J90 Pleural effusion, not elsewhere classified: Secondary | ICD-10-CM | POA: Diagnosis not present

## 2021-12-18 DIAGNOSIS — J9811 Atelectasis: Secondary | ICD-10-CM | POA: Diagnosis not present

## 2021-12-19 DIAGNOSIS — I2692 Saddle embolus of pulmonary artery without acute cor pulmonale: Secondary | ICD-10-CM | POA: Diagnosis not present

## 2021-12-19 DIAGNOSIS — J90 Pleural effusion, not elsewhere classified: Secondary | ICD-10-CM | POA: Diagnosis not present

## 2021-12-19 DIAGNOSIS — J3801 Paralysis of vocal cords and larynx, unilateral: Secondary | ICD-10-CM | POA: Diagnosis not present

## 2021-12-19 DIAGNOSIS — Z952 Presence of prosthetic heart valve: Secondary | ICD-10-CM | POA: Diagnosis not present

## 2021-12-19 DIAGNOSIS — G8918 Other acute postprocedural pain: Secondary | ICD-10-CM | POA: Diagnosis not present

## 2021-12-19 DIAGNOSIS — R490 Dysphonia: Secondary | ICD-10-CM | POA: Diagnosis not present

## 2021-12-20 DIAGNOSIS — I2692 Saddle embolus of pulmonary artery without acute cor pulmonale: Secondary | ICD-10-CM | POA: Diagnosis not present

## 2021-12-20 DIAGNOSIS — I330 Acute and subacute infective endocarditis: Secondary | ICD-10-CM | POA: Diagnosis not present

## 2021-12-20 DIAGNOSIS — Z954 Presence of other heart-valve replacement: Secondary | ICD-10-CM | POA: Diagnosis not present

## 2021-12-20 DIAGNOSIS — I4430 Unspecified atrioventricular block: Secondary | ICD-10-CM | POA: Diagnosis not present

## 2021-12-20 DIAGNOSIS — Z952 Presence of prosthetic heart valve: Secondary | ICD-10-CM | POA: Diagnosis not present

## 2021-12-20 DIAGNOSIS — I070 Rheumatic tricuspid stenosis: Secondary | ICD-10-CM | POA: Diagnosis not present

## 2021-12-20 DIAGNOSIS — I498 Other specified cardiac arrhythmias: Secondary | ICD-10-CM | POA: Diagnosis not present

## 2021-12-21 DIAGNOSIS — I2692 Saddle embolus of pulmonary artery without acute cor pulmonale: Secondary | ICD-10-CM | POA: Diagnosis not present

## 2021-12-21 DIAGNOSIS — Z952 Presence of prosthetic heart valve: Secondary | ICD-10-CM | POA: Diagnosis not present

## 2021-12-21 DIAGNOSIS — G8918 Other acute postprocedural pain: Secondary | ICD-10-CM | POA: Diagnosis not present

## 2021-12-22 ENCOUNTER — Encounter: Payer: Medicaid Other | Admitting: Thoracic Surgery (Cardiothoracic Vascular Surgery)

## 2021-12-22 DIAGNOSIS — I2692 Saddle embolus of pulmonary artery without acute cor pulmonale: Secondary | ICD-10-CM | POA: Diagnosis not present

## 2021-12-22 DIAGNOSIS — R918 Other nonspecific abnormal finding of lung field: Secondary | ICD-10-CM | POA: Diagnosis not present

## 2021-12-22 DIAGNOSIS — Z952 Presence of prosthetic heart valve: Secondary | ICD-10-CM | POA: Diagnosis not present

## 2021-12-23 DIAGNOSIS — I2692 Saddle embolus of pulmonary artery without acute cor pulmonale: Secondary | ICD-10-CM | POA: Diagnosis not present

## 2021-12-24 DIAGNOSIS — I2692 Saddle embolus of pulmonary artery without acute cor pulmonale: Secondary | ICD-10-CM | POA: Diagnosis not present

## 2021-12-25 DIAGNOSIS — I2692 Saddle embolus of pulmonary artery without acute cor pulmonale: Secondary | ICD-10-CM | POA: Diagnosis not present

## 2021-12-25 DIAGNOSIS — I330 Acute and subacute infective endocarditis: Secondary | ICD-10-CM | POA: Diagnosis not present

## 2021-12-26 DIAGNOSIS — Z954 Presence of other heart-valve replacement: Secondary | ICD-10-CM | POA: Diagnosis not present

## 2021-12-26 DIAGNOSIS — I330 Acute and subacute infective endocarditis: Secondary | ICD-10-CM | POA: Diagnosis not present

## 2021-12-26 DIAGNOSIS — I7789 Other specified disorders of arteries and arterioles: Secondary | ICD-10-CM | POA: Diagnosis not present

## 2021-12-26 DIAGNOSIS — R918 Other nonspecific abnormal finding of lung field: Secondary | ICD-10-CM | POA: Diagnosis not present

## 2021-12-26 DIAGNOSIS — I517 Cardiomegaly: Secondary | ICD-10-CM | POA: Diagnosis not present

## 2021-12-26 DIAGNOSIS — I2692 Saddle embolus of pulmonary artery without acute cor pulmonale: Secondary | ICD-10-CM | POA: Diagnosis not present

## 2021-12-26 DIAGNOSIS — Z4682 Encounter for fitting and adjustment of non-vascular catheter: Secondary | ICD-10-CM | POA: Diagnosis not present

## 2021-12-27 DIAGNOSIS — Z954 Presence of other heart-valve replacement: Secondary | ICD-10-CM | POA: Diagnosis not present

## 2021-12-27 DIAGNOSIS — Z452 Encounter for adjustment and management of vascular access device: Secondary | ICD-10-CM | POA: Diagnosis not present

## 2021-12-28 DIAGNOSIS — R490 Dysphonia: Secondary | ICD-10-CM | POA: Insufficient documentation

## 2021-12-28 DIAGNOSIS — I2692 Saddle embolus of pulmonary artery without acute cor pulmonale: Secondary | ICD-10-CM | POA: Diagnosis not present

## 2021-12-28 DIAGNOSIS — I2724 Chronic thromboembolic pulmonary hypertension: Secondary | ICD-10-CM | POA: Diagnosis not present

## 2021-12-28 DIAGNOSIS — Z954 Presence of other heart-valve replacement: Secondary | ICD-10-CM | POA: Diagnosis not present

## 2021-12-28 DIAGNOSIS — I330 Acute and subacute infective endocarditis: Secondary | ICD-10-CM | POA: Diagnosis not present

## 2021-12-28 HISTORY — DX: Dysphonia: R49.0

## 2021-12-29 DIAGNOSIS — I330 Acute and subacute infective endocarditis: Secondary | ICD-10-CM | POA: Diagnosis not present

## 2021-12-29 DIAGNOSIS — I2724 Chronic thromboembolic pulmonary hypertension: Secondary | ICD-10-CM | POA: Diagnosis not present

## 2021-12-29 DIAGNOSIS — I2692 Saddle embolus of pulmonary artery without acute cor pulmonale: Secondary | ICD-10-CM | POA: Diagnosis not present

## 2022-01-01 ENCOUNTER — Telehealth: Payer: Self-pay

## 2022-01-02 DIAGNOSIS — F331 Major depressive disorder, recurrent, moderate: Secondary | ICD-10-CM | POA: Diagnosis not present

## 2022-01-02 NOTE — Telephone Encounter (Signed)
Transition Care Management Unsuccessful Follow-up Telephone Call  Date of discharge and from where:  12/29/2021 from [facility]  Attempts:  1st Attempt  Reason for unsuccessful TCM follow-up call:  Left voice message

## 2022-01-04 NOTE — Telephone Encounter (Signed)
Called number listed: [phone] and was informed to call another number: [phone]  Transition Care Management Follow-up Telephone Call Date of discharge and from where: 12/29/2021 from [facility]  How have you been since you were released from the hospital? Patient stated that she is feeling a lot better. Patient is still having some pain in her chest and back.  Any questions or concerns? No  Items Reviewed: Did the pt receive and understand the discharge instructions provided? Yes  Medications obtained and verified? Yes  Other? No  Any new allergies since your discharge? No  Dietary orders reviewed? No Do you have support at home? Yes   Functional Questionnaire: (I = Independent and D = Dependent) ADLs: I  Bathing/Dressing- I  Meal Prep- I  Eating- I  Maintaining continence- I  Transferring/Ambulation- I  Managing Meds- I   Follow up appointments reviewed:  PCP Hospital f/u appt confirmed? No   Specialist Hospital f/u appt confirmed? Yes  Scheduled to see CardiacThoracic Surgeon on 01/11/2022 @ 10:00. Are transportation arrangements needed? No  If their condition worsens, is the pt aware to call PCP or go to the Emergency Dept.? Yes Was the patient provided with contact information for the PCP's office or ED? Yes Was to pt encouraged to call back with questions or concerns? Yes

## 2022-01-05 DIAGNOSIS — Z419 Encounter for procedure for purposes other than remedying health state, unspecified: Secondary | ICD-10-CM | POA: Diagnosis not present

## 2022-01-05 HISTORY — PX: PACEMAKER IMPLANT: EP1218

## 2022-01-11 DIAGNOSIS — I2724 Chronic thromboembolic pulmonary hypertension: Secondary | ICD-10-CM | POA: Diagnosis not present

## 2022-01-11 DIAGNOSIS — Z954 Presence of other heart-valve replacement: Secondary | ICD-10-CM | POA: Diagnosis not present

## 2022-01-11 DIAGNOSIS — R918 Other nonspecific abnormal finding of lung field: Secondary | ICD-10-CM | POA: Diagnosis not present

## 2022-01-11 DIAGNOSIS — Z952 Presence of prosthetic heart valve: Secondary | ICD-10-CM | POA: Diagnosis not present

## 2022-01-19 ENCOUNTER — Other Ambulatory Visit: Payer: Self-pay | Admitting: Obstetrics and Gynecology

## 2022-01-19 NOTE — Patient Outreach (Signed)
Medicaid Managed Care   Nurse Care Manager Note  01/19/2022 Name:  Latoya [name] MRN:  [id number] DOB:  February 07, 1986  Latoya [name] is an 36 y.o. year old female who is a primary patient of Pcp, No.  The Medicaid Managed Care Coordination team was consulted for assistance with:    Case management, post surgery  Latoya [name] was given information about Medicaid Managed Care Coordination team services today. [place] Patient agreed to services and verbal consent obtained.  Engaged with patient by telephone for initial visit in response to provider referral for case management and/or care coordination services.   Assessments/Interventions:  Review of past medical history, allergies, medications, health status, including review of consultants reports, laboratory and other test data, was performed as part of comprehensive evaluation and provision of chronic care management services.  SDOH (Social Determinants of Health) assessments and interventions performed: SDOH Interventions    Flowsheet Row Patient Outreach Telephone from 01/19/2022 in [place] Coordination  SDOH Interventions   Transportation Interventions Intervention Not Indicated  Utilities Interventions Intervention Not Indicated      Care Plan  Allergies  Allergen Reactions   Naltrexone Anxiety    Severe anxiety Restlessness  Vomiting     Medications Reviewed Today     Reviewed by Gayla Medicus, RN (Registered Nurse) on 01/19/22 at 1313  Med List Status: <None>   Medication Order Taking? Sig Documenting Provider Last Dose Status Informant  acetaminophen (TYLENOL) 325 MG tablet [id number] Yes Take 650 mg by mouth every 6 (six) hours as needed for mild pain. [provider] Taking Active Self  amLODipine (NORVASC) 5 MG tablet [id number] No Take 1 tablet (5 mg total) by mouth daily.  Patient not taking: Reported on 01/19/2022   Barb Merino, MD Not Taking Active    buprenorphine-naloxone (SUBOXONE) 8-2 mg SUBL SL tablet [id number] Yes Place 1 tablet under the tongue daily. Takes 3 times a day [provider]  Active Self  dabigatran (PRADAXA) 150 MG CAPS capsule [id number] Yes Take 1 capsule (150 mg total) by mouth 2 (two) times daily. Rai, Vernelle Emerald, MD Taking Active   ferrous gluconate (FERGON) 324 MG tablet [id number] No Take 1 tablet (324 mg total) by mouth daily with breakfast.  Patient not taking: Reported on 01/19/2022   Barb Merino, MD Not Taking Active   fluconazole (DIFLUCAN) 200 MG tablet [id number] No Take 2 tablets (400 mg total) by mouth daily.  Patient not taking: Reported on 01/19/2022   Barb Merino, MD Not Taking Active   gabapentin (NEURONTIN) 400 MG capsule [id number] Yes Take 1 capsule (400 mg total) by mouth 3 (three) times daily. Barb Merino, MD Taking Active   pantoprazole (PROTONIX) 40 MG tablet [id number]  Take 1 tablet (40 mg total) by mouth daily. [place] Eric J, DO  Expired 10/15/21 2359 Self, Pharmacy Records           Patient Active Problem List   Diagnosis Date Noted   Abnormal CXR 10/23/2021   Obesity (BMI 30-39.9) 10/23/2021   Pleuritic chest pain 10/13/2021   Candidemia ([place]) 10/12/2021   Acute saddle pulmonary embolism ([place]) 10/11/2021   Thrombocytopenia ([place]) 10/11/2021   Pneumonia of left lower lobe due to infectious organism 10/11/2021   Acute pulmonary embolism ([place]) 09/13/2021   Chronic pain 09/07/2021   Chronic lumbar pain 09/06/2021   Ambulatory dysfunction [id number]   History of lice [id number]   MRSA bacteremia 08/27/2021   Septic pulmonary embolism ([place]) 08/27/2021   Hyponatremia  08/04/2021   History of bacterial endocarditis 08/02/2021   Microcytic hypochromic anemia 08/03/2019   MRSA (methicillin resistant staph aureus) culture positive 08/03/2019   Infection of lumbar spine (HCC) 08/03/2019   Lumbar discitis 07/31/2019   Endocarditis of tricuspid valve 07/29/2019   IV drug  abuse (HCC) 07/29/2019   Opioid use disorder 04/05/2019   HCV antibody positive 04/05/2019   Iron deficiency anemia 01/10/2019   Conditions to be addressed/monitored per PCP order:  Case management, post surgery  Care Plan : RN Care Manager Plan of Care  Updates made by Danie Chandler, RN since 01/19/2022 12:00 AM     Problem: Health Promotion or Disease Self-Management (General Plan of Care)      Long-Range Goal: Chronic Disease Management   Start Date: 01/19/2022  Expected End Date: 04/20/2022  Priority: High  Note:   Current Barriers:  Knowledge Deficits related to plan of care for management of post surgery s/p tricuspid valve replacement at [facility] 12/11/21.  Chronic Disease Management support and education needs related to post surgery s/p tricuspid valve replacement 12/11/21 at [facility].   RNCM Clinical Goal(s):  Patient will verbalize understanding of plan for management of post surgery s/p tricuspid valve repair 12/11/21 as evidenced by patient report take all medications exactly as prescribed and will call provider for medication related questions as evidenced by patient report demonstrate understanding of rationale for each prescribed medication as evidenced by patient report attend all scheduled medical appointments as evidenced by patient report continue to work with RN Care Manager to address care management and care coordination needs related to  post surgery  as evidenced by adherence to CM Team Scheduled appointments through collaboration with RN Care manager, provider, and care team.   Interventions: Inter-disciplinary care team collaboration (see longitudinal plan of care) Evaluation of current treatment plan related to  self management and patient's adherence to plan as established by provider    (Status:  New goal.)  Long Term Goal Evaluation of current treatment plan related to  post surgery  , self-management and patient's adherence to plan as established by  provider. Discussed plans with patient for ongoing care management follow up and provided patient with direct contact information for care management team Evaluation of current treatment plan related to post surgery  and patient's adherence to plan as established by provider Reviewed medications with patient Reviewed scheduled/upcoming provider appointments Discussed plans with patient for ongoing care management follow up and provided patient with direct contact information for care management team Assessed social determinant of health barriers  Patient Goals/Self-Care Activities: Take all medications as prescribed Attend all scheduled provider appointments Call pharmacy for medication refills 3-7 days in advance of running out of medications Perform all self care activities independently  Perform IADL's (shopping, preparing meals, housekeeping, managing finances) independently Call provider office for new concerns or questions   Follow Up Plan:  The patient has been provided with contact information for the care management team and has been advised to call with any health related questions or concerns.  The care management team will reach out to the patient again over the next 30 business  days.   Follow Up:  Patient agrees to Care Plan and Follow-up.  Plan: The Managed Medicaid care management team will reach out to the patient again over the next 30 business  days. and The  Patient has been provided with contact information for the Managed Medicaid care management team and has been advised to call with any health  related questions or concerns.  Date/time of next scheduled RN care management/care coordination outreach:  02/26/22 at 0900.

## 2022-01-19 NOTE — Patient Instructions (Signed)
Hi Latoya [name], thanks for speaking with me today, have a nice afternoon.  Latoya [name] was given information about Medicaid Managed Care team care coordination services as a part of their [facility] Medicaid benefit. Latoya [name] verbally consented to engagement with the [facility] Managed Care team.   If you are experiencing a medical emergency, please call 911 or report to your local emergency department or urgent care.   If you have a non-emergency medical problem during routine business hours, please contact your provider's office and ask to speak with a nurse.   For questions related to your [facility] health plan, please call: [phone] or go here:[email or web address]  If you would like to schedule transportation through your [facility] plan, please call the following number at least 2 days in advance of your appointment: [phone].  You can also use the MTM portal or MTM mobile app to manage your rides. For the portal, please go to [email or web address]  Call the [facility] Crisis Line at [phone], at any time, 24 hours a day, 7 days a week. If you are in danger or need immediate medical attention call 911.  If you would like help to quit smoking, call 1-800-QUIT-NOW ((347) 427-8681) OR Espaol: 1-855-Djelo-Ya (4-132-440-1027) o para ms informacin haga clic aqu or Text READY to 253-664 to register via text  Latoya [name] - following are the goals we discussed in your visit today:   Goals Addressed    Timeframe:  Long-Range Goal Priority:  High Start Date:     01/19/22                       Expected End Date:  ongoing                     Follow Up Date 02/23/22    - practice safe sex - schedule appointment for flu shot - schedule appointment for vaccines needed due to my age or health - schedule recommended health tests (blood work, mammogram, colonoscopy, pap test) - schedule and keep appointment for annual check-up    Why is this important?    Screening tests can find diseases early when they are easier to treat.  Your doctor or nurse will talk with you about which tests are important for you.  Getting shots for common diseases like the flu and shingles will help prevent them.    Patient verbalizes understanding of instructions and care plan provided today and agrees to view in MyChart. Active MyChart status and patient understanding of how to access instructions and care plan via MyChart confirmed with patient.     The Managed Medicaid care management team will reach out to the patient again over the next 30 business  days.  The  Patienthas been provided with contact information for the Managed Medicaid care management team and has been advised to call with any health related questions or concerns.   Latoya Der RN, BSN [place]  [facility] Care Management Coordinator - Managed Medicaid High Risk [phone]   Following is a copy of your plan of care:  Care Plan : RN Care Manager Plan of Care  Updates made by Latoya Chandler, RN since 01/19/2022 12:00 AM     Problem: Health Promotion or Disease Self-Management (General Plan of Care)      Long-Range Goal: Chronic Disease Management   Start Date: 01/19/2022  Expected End Date: 04/20/2022  Priority: High  Note:   Current Barriers:  Knowledge Deficits  related to plan of care for management of post surgery s/p tricuspid valve replacement at [facility] 12/11/21.  Chronic Disease Management support and education needs related to post surgery s/p tricuspid valve replacement 12/11/21 at [facility].   RNCM Clinical Goal(s):  Patient will verbalize understanding of plan for management of post surgery s/p tricuspid valve repair 12/11/21 as evidenced by patient report take all medications exactly as prescribed and will call provider for medication related questions as evidenced by patient report demonstrate understanding of rationale for each prescribed medication as evidenced by  patient report attend all scheduled medical appointments as evidenced by patient report continue to work with RN Care Manager to address care management and care coordination needs related to  post surgery  as evidenced by adherence to CM Team Scheduled appointments through collaboration with RN Care manager, provider, and care team.   Interventions: Inter-disciplinary care team collaboration (see longitudinal plan of care) Evaluation of current treatment plan related to  self management and patient's adherence to plan as established by provider    (Status:  New goal.)  Long Term Goal Evaluation of current treatment plan related to  post surgery  , self-management and patient's adherence to plan as established by provider. Discussed plans with patient for ongoing care management follow up and provided patient with direct contact information for care management team Evaluation of current treatment plan related to post surgery  and patient's adherence to plan as established by provider Reviewed medications with patient Reviewed scheduled/upcoming provider appointments Discussed plans with patient for ongoing care management follow up and provided patient with direct contact information for care management team Assessed social determinant of health barriers  Patient Goals/Self-Care Activities: Take all medications as prescribed Attend all scheduled provider appointments Call pharmacy for medication refills 3-7 days in advance of running out of medications Perform all self care activities independently  Perform IADL's (shopping, preparing meals, housekeeping, managing finances) independently Call provider office for new concerns or questions   Follow Up Plan:  The patient has been provided with contact information for the care management team and has been advised to call with any health related questions or concerns.  The care management team will reach out to the patient again over the next 30  business  days.

## 2022-01-27 DIAGNOSIS — R55 Syncope and collapse: Secondary | ICD-10-CM | POA: Diagnosis not present

## 2022-01-27 DIAGNOSIS — R42 Dizziness and giddiness: Secondary | ICD-10-CM | POA: Diagnosis not present

## 2022-01-31 ENCOUNTER — Other Ambulatory Visit: Payer: Self-pay

## 2022-02-01 ENCOUNTER — Encounter: Payer: Self-pay | Admitting: Cardiology

## 2022-02-01 ENCOUNTER — Ambulatory Visit: Payer: Medicaid Other | Attending: Cardiology | Admitting: Cardiology

## 2022-02-01 ENCOUNTER — Telehealth: Payer: Self-pay

## 2022-02-01 VITALS — BP 100/70 | HR 73 | Ht 66.0 in | Wt 201.4 lb

## 2022-02-01 DIAGNOSIS — Z8774 Personal history of (corrected) congenital malformations of heart and circulatory system: Secondary | ICD-10-CM | POA: Insufficient documentation

## 2022-02-01 DIAGNOSIS — I442 Atrioventricular block, complete: Secondary | ICD-10-CM

## 2022-02-01 DIAGNOSIS — Z86711 Personal history of pulmonary embolism: Secondary | ICD-10-CM | POA: Diagnosis not present

## 2022-02-01 DIAGNOSIS — I272 Pulmonary hypertension, unspecified: Secondary | ICD-10-CM

## 2022-02-01 DIAGNOSIS — R531 Weakness: Secondary | ICD-10-CM

## 2022-02-01 DIAGNOSIS — F1910 Other psychoactive substance abuse, uncomplicated: Secondary | ICD-10-CM | POA: Diagnosis not present

## 2022-02-01 DIAGNOSIS — I519 Heart disease, unspecified: Secondary | ICD-10-CM

## 2022-02-01 DIAGNOSIS — Z954 Presence of other heart-valve replacement: Secondary | ICD-10-CM | POA: Diagnosis not present

## 2022-02-01 DIAGNOSIS — R42 Dizziness and giddiness: Secondary | ICD-10-CM | POA: Diagnosis not present

## 2022-02-01 DIAGNOSIS — Z7901 Long term (current) use of anticoagulants: Secondary | ICD-10-CM | POA: Diagnosis not present

## 2022-02-01 DIAGNOSIS — Z87891 Personal history of nicotine dependence: Secondary | ICD-10-CM | POA: Diagnosis not present

## 2022-02-01 HISTORY — DX: Personal history of (corrected) congenital malformations of heart and circulatory system: Z87.74

## 2022-02-01 HISTORY — DX: Weakness: R53.1

## 2022-02-01 NOTE — Telephone Encounter (Signed)
EMS called and report given to Posey Pronto, RN in the ER at [facility].

## 2022-02-01 NOTE — Progress Notes (Addendum)
Cardiology Consultation:    Date:  02/01/2022   ID:  Latoya [name], DOB May 19, 1985, MRN [id number]  PCP:  Pcp, No  Cardiologist:  Gypsy Balsam, MD   Referring MD: No ref. provider found   Chief Complaint  Patient presents with   Establish Care    History of Present Illness:    Latoya [name] is a 36 y.o. female who is being seen today for the evaluation of status post tricuspid valve replacement secondary to endocarditis at the request of No ref. provider found.  Past medical history significant for polysubstance abuse, IV drug abuse that led to endocarditis of the tricuspid valve, in August of this year she did have tricuspid valve replacement done at [facility].  Recovery was complicated by previous of complete heart block.  She alternated between high degree AV block and normal sinus rhythm sinus tachycardia.  She was discharged home with a idea to observe her clinically.  She also had history of recurrent PE with pulmonary hypertension related to it.  She is anticoagulated.  She would like to be established as a patient in my practice.  She said initially after surgery she felt well however within the last 3 to 4 weeks she feels very poorly she had episode of near syncope, also lack of energy and shortness of breath as well as some swelling of lower extremities at evening time.  EKG done in my office today show high degree AV block.  With multiple PVCs and runs of nonsustained ventricular tachycardia.  She denies have any chest pain tightness squeezing pressure burning chest.  Past Medical History:  Diagnosis Date   Abnormal CXR 10/23/2021   Acute bacterial endocarditis 01/07/2019   Last Assessment & Plan:  Formatting of this note might be different from the original. Tricuspid valve TTE 09/02 positive for mobile vegetations are noted on the tricuspid valve, suggestive of Endocarditis, with moderate tricuspid regurgitation. . Estimated EF 65 to 70%.  Normal LV and RV structure and function.   - Centralized cardiac monitoring - Continue IV antibiotic therapy per ID.  Currently    Acute pulmonary embolism (HCC) 09/13/2021   Acute saddle pulmonary embolism (HCC) 10/11/2021   Ambulatory dysfunction 09/06/2021   Anemia 01/09/2019   Last Assessment & Plan:  Formatting of this note might be different from the original. Stable at this time  -Trend CBC - Anemia work-up   Bilateral pleural effusion 12/06/2021   Candidemia (HCC) 10/12/2021   Chronic lumbar pain 09/06/2021   Chronic pain 09/07/2021   Complete heart block (HCC) 12/12/2021   Dysphonia 12/28/2021   Endocarditis of tricuspid valve 07/29/2019   Gram-positive bacteremia 01/07/2019   Last Assessment & Plan:  Formatting of this note might be different from the original. MSSA  - Continue vancomycin IV - Followed by ID.  See acute bacterial endocarditis for further plan.   HCV antibody positive 04/05/2019   Hepatitis C 12/06/2021   Heroin addiction (HCC)    History of bacterial endocarditis 08/02/2021   History of lice 09/06/2021   Hyponatremia 08/04/2021   Infection of lumbar spine (HCC) 08/03/2019   Iron deficiency anemia 01/10/2019   IV drug abuse (HCC)    Leukocytosis 12/12/2021   Lumbar discitis 07/31/2019   Microcytic hypochromic anemia 08/03/2019   MRSA (methicillin resistant staph aureus) culture positive 08/03/2019   MRSA bacteremia 08/27/2021   Obesity (BMI 30-39.9) 10/23/2021   Opioid use disorder 04/05/2019   Pleuritic chest pain 10/13/2021   Pneumonia of left lower lobe due  to infectious organism 10/11/2021   Polysubstance abuse ([place]) 01/07/2019   Last Assessment & Plan:  Formatting of this note might be different from the original. - Psychiatry following, who will continue to monitor vitals and consider low dose subutex as patient becomes more hemodynamically stable - Counseling Formatting of this note might be different from the original. Last Assessment & Plan: Formatting of this note might be different from the original. - Psychiatry fo    Postoperative pain 12/12/2021   Pulmonary hypertension ([place]) 12/06/2021   Right ventricular dysfunction 12/12/2021   S/P TVR (tricuspid valve replacement) 12/12/2021   Septic pulmonary embolism ([place]) 08/27/2021   Septic shock ([place]) 01/06/2019   Last Assessment & Plan:  Formatting of this note might be different from the original. RESOLVED Hemodynamically stable - Continue IV fluids   Severe tricuspid regurgitation 12/06/2021   Thrombocytopenia ([place]) 10/11/2021    Past Surgical History:  Procedure Laterality Date   APPLICATION OF ANGIOVAC N/A 08/11/2021   Procedure: ABORTED APPLICATION OF Floy Sabina;  Surgeon: Lajuana Matte, MD;  Location: [place];  Service: Vascular;  Laterality: N/A;   Blood clot removed     Pulmonary   IR LUMBAR DISC ASPIRATION W/IMG GUIDE  07/29/2019   IR US GUIDE VASC ACCESS RIGHT  10/19/2021   IR VENO/EXT/UNI RIGHT  10/19/2021   TEE WITHOUT CARDIOVERSION N/A 08/08/2021   Procedure: TRANSESOPHAGEAL ECHOCARDIOGRAM (TEE);  Surgeon: Josue Hector, MD;  Location: [facility] ENDOSCOPY;  Service: Cardiovascular;  Laterality: N/A;   TEE WITHOUT CARDIOVERSION N/A 08/11/2021   Procedure: TRANSESOPHAGEAL ECHOCARDIOGRAM (TEE);  Surgeon: Lajuana Matte, MD;  Location: [place];  Service: Open Heart Surgery;  Laterality: N/A;   VALVE REPLACEMENT      Current Medications: Current Meds  Medication Sig   amLODipine (NORVASC) 5 MG tablet Take 1 tablet (5 mg total) by mouth daily.   buprenorphine-naloxone (SUBOXONE) 8-2 mg SUBL SL tablet Place 1 tablet under the tongue daily. Takes 3 times a day   dabigatran (PRADAXA) 150 MG CAPS capsule Take 1 capsule (150 mg total) by mouth 2 (two) times daily.   [DISCONTINUED] acetaminophen (TYLENOL) 325 MG tablet Take 650 mg by mouth every 6 (six) hours as needed for mild pain.   [DISCONTINUED] ferrous gluconate (FERGON) 324 MG tablet Take 1 tablet (324 mg total) by mouth daily with breakfast.   [DISCONTINUED] fluconazole (DIFLUCAN) 200 MG tablet Take 2  tablets (400 mg total) by mouth daily.   [DISCONTINUED] gabapentin (NEURONTIN) 400 MG capsule Take 1 capsule (400 mg total) by mouth 3 (three) times daily.   [DISCONTINUED] pantoprazole (PROTONIX) 40 MG tablet Take 1 tablet (40 mg total) by mouth daily.     Allergies:   Naltrexone   Social History   Socioeconomic History   Marital status: Single    Spouse name: Not on file   Number of children: Not on file   Years of education: Not on file   Highest education level: Not on file  Occupational History   Not on file  Tobacco Use   Smoking status: Light Smoker    Types: Cigarettes   Smokeless tobacco: Former  Scientific laboratory technician Use: Never used  Substance and Sexual Activity   Alcohol use: Yes   Drug use: Not Currently    Types: Heroin    Comment: Heroin    Sexual activity: Yes  Other Topics Concern   Not on file  Social History Narrative   Lives with her parents and her two  girls who are 64 and 35 years old.    Social Determinants of Health   Financial Resource Strain: Not on file  Food Insecurity: Not on file  Transportation Needs: No Transportation Needs (01/19/2022)   PRAPARE - Administrator, Civil Service (Medical): No    Lack of Transportation (Non-Medical): No  Physical Activity: Not on file  Stress: Not on file  Social Connections: Not on file     Family History: The patient's family history includes Cancer in her paternal grandfather; Healthy in her mother; Hyperlipidemia in her father; Hypertension in her mother. ROS:   Please see the history of present illness.    All 14 point review of systems negative except as described per history of present illness.  EKGs/Labs/Other Studies Reviewed:    The following studies were reviewed today: I did review extensive record from [facility]  EKG:  EKG is  ordered today.  The ekg ordered today demonstrates complete heart block, PVCs with runs of nonsustained ventricular tachycardia, normal QS complex  duration morphology with nonspecific ST segment changes  Recent Labs: 10/25/2021: ALT 7 11/02/2021: B Natriuretic Peptide 464.1 11/10/2021: Magnesium 1.9 11/12/2021: TSH 1.438 11/20/2021: BUN 8; Creatinine, Ser 0.56; Potassium 3.9; Sodium 135 11/23/2021: Hemoglobin 11.4; Platelets 297  Recent Lipid Panel No results found for: "CHOL", "TRIG", "HDL", "CHOLHDL", "VLDL", "LDLCALC", "LDLDIRECT"  Physical Exam:    VS:  BP 100/70 (BP Location: Left Arm, Patient Position: Sitting)   Pulse 73   Ht 5\' 6"  (1.676 m)   Wt 201 lb 6.4 oz (91.4 kg)   SpO2 98%   BMI 32.51 kg/m     Wt Readings from Last 3 Encounters:  02/01/22 201 lb 6.4 oz (91.4 kg)  11/25/21 186 lb 11.7 oz (84.7 kg)  10/03/21 185 lb 3.2 oz (84 kg)     GEN:  Well nourished, well developed in no acute distress HEENT: Normal NECK: No JVD; No carotid bruits LYMPHATICS: No lymphadenopathy CARDIAC: RRR, no murmurs, no rubs, no gallops RESPIRATORY:  Clear to auscultation without rales, wheezing or rhonchi  ABDOMEN: Soft, non-tender, non-distended MUSCULOSKELETAL:  No edema; No deformity  SKIN: Warm and dry NEUROLOGIC:  Alert and oriented x 3 PSYCHIATRIC:  Normal affect   ASSESSMENT:    1. S/P TVR (tricuspid valve replacement)   2. Polysubstance abuse (HCC)   3. Complete heart block (HCC)   4. Pulmonary hypertension (HCC)   5. Right ventricular dysfunction    PLAN:    In order of problems listed above:  Status post tricuspid valve replacement.  Auscultation did not reveal any significant heart murmur however tones are distant.  She will require echocardiogram to assess the valve. Polysubstance abuse.  She does not use any more IV drugs. Complete heart block noted on EKG.  I had a long discussion with her I offer her admission to [facility] for potential pacemaker implantation.  Also spoke to my EP colleague Dr. [facility] regarding that issue and we both analyze her EKG and we thinking best approach is to admit her to the hospital  and consider permanent pacemaker.  She told me that she does not want to go to [facility].  She prefers to go back to [facility] Pulmonary hypertension which is related to multiple pulmonary emboli, anticoagulated which I will continue. Right ventricle dysfunction: Continue monitoring  I spent at least 1-1/2-hour managing this patient.  There were multiple phone calls to [facility] and making regimens for transportation and bed placement.  Also  spoke to her cardiothoracic surgeon.  Eventually she was transferred to our emergency room in [facility] where I did see her as well.  Since there is no technical ability to bring her to [facility].  She was transferred to [facility].  She did not want to go to [facility].   Medication Adjustments/Labs and Tests Ordered: Current medicines are reviewed at length with the patient today.  Concerns regarding medicines are outlined above.  No orders of the defined types were placed in this encounter.  No orders of the defined types were placed in this encounter.   Signed, Georgeanna Lea, MD, [facility]. 02/01/2022 2:59 PM    [facility]

## 2022-02-02 DIAGNOSIS — F17210 Nicotine dependence, cigarettes, uncomplicated: Secondary | ICD-10-CM | POA: Diagnosis not present

## 2022-02-02 DIAGNOSIS — I371 Nonrheumatic pulmonary valve insufficiency: Secondary | ICD-10-CM | POA: Diagnosis not present

## 2022-02-02 DIAGNOSIS — F32A Depression, unspecified: Secondary | ICD-10-CM | POA: Diagnosis not present

## 2022-02-02 DIAGNOSIS — I459 Conduction disorder, unspecified: Secondary | ICD-10-CM | POA: Diagnosis not present

## 2022-02-02 DIAGNOSIS — I442 Atrioventricular block, complete: Secondary | ICD-10-CM | POA: Diagnosis not present

## 2022-02-02 DIAGNOSIS — I472 Ventricular tachycardia, unspecified: Secondary | ICD-10-CM | POA: Diagnosis not present

## 2022-02-02 DIAGNOSIS — Z952 Presence of prosthetic heart valve: Secondary | ICD-10-CM | POA: Diagnosis not present

## 2022-02-02 DIAGNOSIS — B1920 Unspecified viral hepatitis C without hepatic coma: Secondary | ICD-10-CM | POA: Diagnosis not present

## 2022-02-02 DIAGNOSIS — F1110 Opioid abuse, uncomplicated: Secondary | ICD-10-CM | POA: Diagnosis not present

## 2022-02-02 DIAGNOSIS — Z006 Encounter for examination for normal comparison and control in clinical research program: Secondary | ICD-10-CM | POA: Diagnosis not present

## 2022-02-02 DIAGNOSIS — Z888 Allergy status to other drugs, medicaments and biological substances status: Secondary | ICD-10-CM | POA: Diagnosis not present

## 2022-02-02 DIAGNOSIS — F1410 Cocaine abuse, uncomplicated: Secondary | ICD-10-CM | POA: Diagnosis not present

## 2022-02-02 DIAGNOSIS — M7989 Other specified soft tissue disorders: Secondary | ICD-10-CM | POA: Diagnosis not present

## 2022-02-02 DIAGNOSIS — Z950 Presence of cardiac pacemaker: Secondary | ICD-10-CM | POA: Diagnosis not present

## 2022-02-02 DIAGNOSIS — Z818 Family history of other mental and behavioral disorders: Secondary | ICD-10-CM | POA: Diagnosis not present

## 2022-02-02 DIAGNOSIS — Z8679 Personal history of other diseases of the circulatory system: Secondary | ICD-10-CM | POA: Diagnosis not present

## 2022-02-03 DIAGNOSIS — I4430 Unspecified atrioventricular block: Secondary | ICD-10-CM | POA: Diagnosis not present

## 2022-02-03 DIAGNOSIS — I441 Atrioventricular block, second degree: Secondary | ICD-10-CM | POA: Diagnosis not present

## 2022-02-03 DIAGNOSIS — Z952 Presence of prosthetic heart valve: Secondary | ICD-10-CM | POA: Diagnosis not present

## 2022-02-03 DIAGNOSIS — M7989 Other specified soft tissue disorders: Secondary | ICD-10-CM | POA: Diagnosis not present

## 2022-02-03 DIAGNOSIS — I472 Ventricular tachycardia, unspecified: Secondary | ICD-10-CM | POA: Diagnosis not present

## 2022-02-03 DIAGNOSIS — I4510 Unspecified right bundle-branch block: Secondary | ICD-10-CM | POA: Diagnosis not present

## 2022-02-04 DIAGNOSIS — Z419 Encounter for procedure for purposes other than remedying health state, unspecified: Secondary | ICD-10-CM | POA: Diagnosis not present

## 2022-02-04 DIAGNOSIS — I4430 Unspecified atrioventricular block: Secondary | ICD-10-CM | POA: Diagnosis not present

## 2022-02-04 DIAGNOSIS — I472 Ventricular tachycardia, unspecified: Secondary | ICD-10-CM | POA: Diagnosis not present

## 2022-02-04 DIAGNOSIS — I493 Ventricular premature depolarization: Secondary | ICD-10-CM | POA: Diagnosis not present

## 2022-02-04 DIAGNOSIS — I441 Atrioventricular block, second degree: Secondary | ICD-10-CM | POA: Diagnosis not present

## 2022-02-04 DIAGNOSIS — M7989 Other specified soft tissue disorders: Secondary | ICD-10-CM | POA: Diagnosis not present

## 2022-02-04 DIAGNOSIS — Z952 Presence of prosthetic heart valve: Secondary | ICD-10-CM | POA: Diagnosis not present

## 2022-02-05 DIAGNOSIS — I4430 Unspecified atrioventricular block: Secondary | ICD-10-CM | POA: Diagnosis not present

## 2022-02-05 DIAGNOSIS — M7989 Other specified soft tissue disorders: Secondary | ICD-10-CM | POA: Diagnosis not present

## 2022-02-05 DIAGNOSIS — I459 Conduction disorder, unspecified: Secondary | ICD-10-CM | POA: Diagnosis not present

## 2022-02-05 DIAGNOSIS — I472 Ventricular tachycardia, unspecified: Secondary | ICD-10-CM | POA: Diagnosis not present

## 2022-02-05 DIAGNOSIS — I9789 Other postprocedural complications and disorders of the circulatory system, not elsewhere classified: Secondary | ICD-10-CM | POA: Diagnosis not present

## 2022-02-05 DIAGNOSIS — I4510 Unspecified right bundle-branch block: Secondary | ICD-10-CM | POA: Diagnosis not present

## 2022-02-05 DIAGNOSIS — Z952 Presence of prosthetic heart valve: Secondary | ICD-10-CM | POA: Diagnosis not present

## 2022-02-05 DIAGNOSIS — I441 Atrioventricular block, second degree: Secondary | ICD-10-CM | POA: Diagnosis not present

## 2022-02-06 DIAGNOSIS — I459 Conduction disorder, unspecified: Secondary | ICD-10-CM | POA: Diagnosis not present

## 2022-02-06 DIAGNOSIS — I442 Atrioventricular block, complete: Secondary | ICD-10-CM | POA: Diagnosis not present

## 2022-02-06 DIAGNOSIS — Z950 Presence of cardiac pacemaker: Secondary | ICD-10-CM | POA: Diagnosis not present

## 2022-02-06 DIAGNOSIS — I4729 Other ventricular tachycardia: Secondary | ICD-10-CM | POA: Diagnosis not present

## 2022-02-06 DIAGNOSIS — R918 Other nonspecific abnormal finding of lung field: Secondary | ICD-10-CM | POA: Diagnosis not present

## 2022-02-07 ENCOUNTER — Ambulatory Visit: Payer: Medicaid Other | Admitting: Cardiology

## 2022-02-09 ENCOUNTER — Telehealth: Payer: Self-pay | Admitting: Cardiology

## 2022-02-09 NOTE — Telephone Encounter (Signed)
Spoke with pts mother- per DPR. She was wanting follow up appt after her pacemaker surgery. Sent to front desk for appt.

## 2022-02-09 NOTE — Telephone Encounter (Signed)
New Message:     Patient had a pacemaker put in at [facility] She wants an appointment to follow up with her Cardiologist. Does she need to follow up with Dr Raliegh Ip or does he need to refer her to one of our pacemaker doctors?

## 2022-02-21 ENCOUNTER — Encounter: Payer: Self-pay | Admitting: Cardiology

## 2022-02-21 ENCOUNTER — Ambulatory Visit: Payer: Medicaid Other | Attending: Cardiology | Admitting: Cardiology

## 2022-02-21 ENCOUNTER — Telehealth: Payer: Self-pay | Admitting: *Deleted

## 2022-02-21 VITALS — BP 108/72 | HR 98 | Ht 66.0 in | Wt 204.0 lb

## 2022-02-21 DIAGNOSIS — Z954 Presence of other heart-valve replacement: Secondary | ICD-10-CM

## 2022-02-21 DIAGNOSIS — Z950 Presence of cardiac pacemaker: Secondary | ICD-10-CM

## 2022-02-21 DIAGNOSIS — Z8774 Personal history of (corrected) congenital malformations of heart and circulatory system: Secondary | ICD-10-CM | POA: Diagnosis not present

## 2022-02-21 DIAGNOSIS — Z86711 Personal history of pulmonary embolism: Secondary | ICD-10-CM

## 2022-02-21 HISTORY — DX: Presence of cardiac pacemaker: Z95.0

## 2022-02-21 HISTORY — DX: Personal history of pulmonary embolism: Z86.711

## 2022-02-21 MED ORDER — AMLODIPINE BESYLATE 2.5 MG PO TABS: 2.5000 mg | ORAL_TABLET | Freq: Every day | ORAL | 3 refills | Status: DC

## 2022-02-21 NOTE — Telephone Encounter (Signed)
Sent referral to [facility] for Cardiac Rehab per Dr. Agustin Cree

## 2022-02-21 NOTE — Patient Instructions (Addendum)
Medication Instructions:  Your physician has recommended you make the following change in your medication:  Decrease Amlodipine to 2.5 mg once daily  *If you need a refill on your cardiac medications before your next appointment, please call your pharmacy*   Lab Work: NONE If you have labs (blood work) drawn today and your tests are completely normal, you will receive your results only by: [place] (if you have MyChart) OR A paper copy in the mail If you have any lab test that is abnormal or we need to change your treatment, we will call you to review the results.   Testing/Procedures: NONE   Follow-Up: At [facility], you and your health needs are our priority.  As part of our continuing mission to provide you with exceptional heart care, we have created designated Provider Care Teams.  These Care Teams include your primary Cardiologist (physician) and Advanced Practice Providers (APPs -  Physician Assistants and Nurse Practitioners) who all work together to provide you with the care you need, when you need it.  We recommend signing up for the patient portal called "MyChart".  Sign up information is provided on this After Visit Summary.  MyChart is used to connect with patients for Virtual Visits (Telemedicine).  Patients are able to view lab/test results, encounter notes, upcoming appointments, etc.  Non-urgent messages can be sent to your provider as well.   To learn more about what you can do with MyChart, go to [email or web address].    Your next appointment:   3 month(s)  The format for your next appointment:   In Person  Provider:   Jenne Campus, MD    Other Instructions You have been referred to [facility] for Cardiac Rehab. They will contact you for an appointment  Important Information About [place]

## 2022-02-21 NOTE — Progress Notes (Signed)
Cardiology Office Note:    Date:  02/21/2022   ID:  Latoya [name], DOB [birth year], MRN [id number]  PCP:  Pcp, No  Cardiologist:  Jenne Campus, MD    Referring MD: No ref. provider found   Chief Complaint  Patient presents with   Follow-up    Pace maker    History of Present Illness:    Latoya [name] is a 36 y.o. female with past medical history significant for endocarditis secondary to IV drug abuse, she eventually end up having a tricuspid valve replaced which was done in August at [facility].  She also had history of multiple septic emboli.  She is being anticoagulated, she also had history of complete heart block that happened to recover after surgery.  I did see her first time in September 28 when she came to me to be established as a patient she felt absolutely miserable EKG done at that time showed high degree/complete heart block.  Eventually she ended up being admitted to [facility] where she got leadless pacemaker implanted.  Since that time she seems to be doing well.  Interesting observation was also the fact that she got multiple rounds of ventricular tachycardia while in [place].  She was put on lidocaine and eventually supposed to be discharged on metoprolol however I do not see this medication on the list of her meds.  Overall she says she is feeling better she denies have any chest pain tightness squeezing pressure burning chest.  She says she does not have those episodes that she feels like she is going to pass out.  But still complain of being weak and tired.  Past Medical History:  Diagnosis Date   Abnormal CXR 10/23/2021   Acute bacterial endocarditis 01/07/2019   Last Assessment & Plan:  Formatting of this note might be different from the original. Tricuspid valve TTE 09/02 positive for mobile vegetations are noted on the tricuspid valve, suggestive of Endocarditis, with moderate tricuspid regurgitation. . Estimated EF 65 to 70%.  Normal LV and RV structure and  function.  - Centralized cardiac monitoring - Continue IV antibiotic therapy per ID.  Currently    Acute pulmonary embolism ([place]) 09/13/2021   Acute saddle pulmonary embolism ([place]) 10/11/2021   Ambulatory dysfunction 09/06/2021   Anemia 01/09/2019   Last Assessment & Plan:  Formatting of this note might be different from the original. Stable at this time  -Trend CBC - Anemia work-up   Bilateral pleural effusion 12/06/2021   Candidemia ([place]) 10/12/2021   Chronic lumbar pain 09/06/2021   Chronic pain 09/07/2021   Complete heart block ([place]) 12/12/2021   Dysphonia 12/28/2021   Endocarditis of tricuspid valve 07/29/2019   Gram-positive bacteremia 01/07/2019   Last Assessment & Plan:  Formatting of this note might be different from the original. MSSA  - Continue vancomycin IV - Followed by ID.  See acute bacterial endocarditis for further plan.   HCV antibody positive 04/05/2019   Hepatitis C 12/06/2021   Heroin addiction ([place])    History of bacterial endocarditis 99991111   History of lice [id number]   Hyponatremia 08/04/2021   Infection of lumbar spine ([place]) 08/03/2019   Iron deficiency anemia 01/10/2019   IV drug abuse ([place])    Leukocytosis 12/12/2021   Lumbar discitis 07/31/2019   Microcytic hypochromic anemia 08/03/2019   MRSA (methicillin resistant staph aureus) culture positive 08/03/2019   MRSA bacteremia 08/27/2021   Obesity (BMI 30-39.9) 10/23/2021   Opioid use disorder 04/05/2019   Pleuritic chest pain  10/13/2021   Pneumonia of left lower lobe due to infectious organism 10/11/2021   Polysubstance abuse ([place]) 01/07/2019   Last Assessment & Plan:  Formatting of this note might be different from the original. - Psychiatry following, who will continue to monitor vitals and consider low dose subutex as patient becomes more hemodynamically stable - Counseling Formatting of this note might be different from the original. Last Assessment & Plan: Formatting of this note might be different from the original. - Psychiatry fo    Postoperative pain 12/12/2021   Pulmonary hypertension ([place]) 12/06/2021   Right ventricular dysfunction 12/12/2021   S/P TVR (tricuspid valve replacement) 12/12/2021   Septic pulmonary embolism ([place]) 08/27/2021   Septic shock ([place]) 01/06/2019   Last Assessment & Plan:  Formatting of this note might be different from the original. RESOLVED Hemodynamically stable - Continue IV fluids   Severe tricuspid regurgitation 12/06/2021   Thrombocytopenia ([place]) 10/11/2021    Past Surgical History:  Procedure Laterality Date   APPLICATION OF ANGIOVAC N/A 08/11/2021   Procedure: ABORTED APPLICATION OF Floy Sabina;  Surgeon: Lajuana Matte, MD;  Location: [place];  Service: Vascular;  Laterality: N/A;   Blood clot removed     Pulmonary   IR LUMBAR DISC ASPIRATION W/IMG GUIDE  07/29/2019   IR US GUIDE VASC ACCESS RIGHT  10/19/2021   IR VENO/EXT/UNI RIGHT  10/19/2021   PACEMAKER IMPLANT  01/2022   TEE WITHOUT CARDIOVERSION N/A 08/08/2021   Procedure: TRANSESOPHAGEAL ECHOCARDIOGRAM (TEE);  Surgeon: Josue Hector, MD;  Location: [facility] ENDOSCOPY;  Service: Cardiovascular;  Laterality: N/A;   TEE WITHOUT CARDIOVERSION N/A 08/11/2021   Procedure: TRANSESOPHAGEAL ECHOCARDIOGRAM (TEE);  Surgeon: Lajuana Matte, MD;  Location: [place];  Service: Open Heart Surgery;  Laterality: N/A;   VALVE REPLACEMENT      Current Medications: Current Meds  Medication Sig   amLODipine (NORVASC) 5 MG tablet Take 1 tablet (5 mg total) by mouth daily.   buprenorphine-naloxone (SUBOXONE) 8-2 mg SUBL SL tablet Place 1 tablet under the tongue daily. Takes 3 times a day   dabigatran (PRADAXA) 150 MG CAPS capsule Take 1 capsule (150 mg total) by mouth 2 (two) times daily.     Allergies:   Naltrexone   Social History   Socioeconomic History   Marital status: Single    Spouse name: Not on file   Number of children: Not on file   Years of education: Not on file   Highest education level: Not on file  Occupational History   Not on  file  Tobacco Use   Smoking status: Light Smoker    Types: Cigarettes   Smokeless tobacco: Former  Scientific laboratory technician Use: Never used  Substance and Sexual Activity   Alcohol use: Yes   Drug use: Not Currently    Types: Heroin    Comment: Heroin    Sexual activity: Yes  Other Topics Concern   Not on file  Social History Narrative   Lives with her parents and her two girls who are 25 and 35 years old.    Social Determinants of Health   Financial Resource Strain: Not on file  Food Insecurity: Not on file  Transportation Needs: No Transportation Needs (01/19/2022)   PRAPARE - Hydrologist (Medical): No    Lack of Transportation (Non-Medical): No  Physical Activity: Not on file  Stress: Not on file  Social Connections: Not on file     Family History: The  patient's family history includes Cancer in her paternal grandfather; Healthy in her mother; Hyperlipidemia in her father; Hypertension in her mother. ROS:   Please see the history of present illness.    All 14 point review of systems negative except as described per history of present illness  EKGs/Labs/Other Studies Reviewed:      Recent Labs: 10/25/2021: ALT 7 11/02/2021: B Natriuretic Peptide 464.1 11/10/2021: Magnesium 1.9 11/12/2021: TSH 1.438 11/20/2021: BUN 8; Creatinine, Ser 0.56; Potassium 3.9; Sodium 135 11/23/2021: Hemoglobin 11.4; Platelets 297  Recent Lipid Panel No results found for: "CHOL", "TRIG", "HDL", "CHOLHDL", "VLDL", "LDLCALC", "LDLDIRECT"  Physical Exam:    VS:  BP 108/72 (BP Location: Left Arm, Patient Position: Sitting)   Pulse 98   Ht 5\' 6"  (1.676 m)   Wt 204 lb (92.5 kg)   SpO2 98%   BMI 32.93 kg/m     Wt Readings from Last 3 Encounters:  02/21/22 204 lb (92.5 kg)  02/01/22 201 lb 6.4 oz (91.4 kg)  11/25/21 186 lb 11.7 oz (84.7 kg)     GEN:  Well nourished, well developed in no acute distress HEENT: Normal NECK: No JVD; No carotid bruits LYMPHATICS:  No lymphadenopathy CARDIAC: RRR, no murmurs, no rubs, no gallops RESPIRATORY:  Clear to auscultation without rales, wheezing or rhonchi  ABDOMEN: Soft, non-tender, non-distended MUSCULOSKELETAL:  No edema; No deformity  SKIN: Warm and dry LOWER EXTREMITIES: no swelling NEUROLOGIC:  Alert and oriented x 3 PSYCHIATRIC:  Normal affect   ASSESSMENT:    1. S/P TVR (tricuspid valve replacement)   2. S/P patent foramen ovale closure   3. History of pulmonary embolism   4. Pacemaker Leadless device    PLAN:    In order of problems listed above:  Status post tricuspid valve replacement.  I will try to get echocardiogram from [facility] that was done recently.  She seems hemodynamically compensated. Status post patent foramina ovale closure.  Noted. History of pulmonary emboli: She is anticoagulated which I will continue Leadless pacemaker we will make arrangements for routine follow-up. She is recovering quite nicely.  I will ask her to have EKG done today, she will be referred to our pacemaker clinic as well as cardiac rehab.  She may require to monitor her trend to determine if she still gets around some ventricular tachycardia   Medication Adjustments/Labs and Tests Ordered: Current medicines are reviewed at length with the patient today.  Concerns regarding medicines are outlined above.  No orders of the defined types were placed in this encounter.  Medication changes: No orders of the defined types were placed in this encounter.   Signed, Park Liter, MD, [facility] 02/21/2022 2:24 PM    [place]

## 2022-02-26 ENCOUNTER — Other Ambulatory Visit: Payer: Self-pay | Admitting: Obstetrics and Gynecology

## 2022-02-26 NOTE — Patient Outreach (Signed)
Care Coordination  02/26/2022  Latoya [name] March 09, 1986 [id number]  RNCM called patient at scheduled time.  Patient's Mother answered phone and asked that patient be called another time.  RNCM rescheduled appointment.  Aida Raider RN, BSN   [employer] - Managed Medicaid High Risk [phone]

## 2022-03-27 ENCOUNTER — Other Ambulatory Visit: Payer: Medicaid Other | Admitting: Obstetrics and Gynecology

## 2022-03-27 NOTE — Patient Instructions (Signed)
Hi Ms. Hillenburg. I am sorry we ere not able to reach you today, I hope you are doing well- as a part of your Medicaid benefit, you are eligible for care management and care coordination services at no cost or copay. I was unable to reach you by phone today but would be happy to help you with your health related needs. Please feel free to call me at [phone].  A member of the Managed Medicaid care management team will reach out to you again over the next 30 business days.   Kathi Der RN, BSN [place]  [employer], production - Managed Medicaid High Risk [phone]

## 2022-03-27 NOTE — Patient Outreach (Signed)
Care Coordination  03/27/2022  Latoya [name] 1985/05/22 [id number]    Medicaid Managed Care   Unsuccessful Outreach Note  03/27/2022 Name: Latoya [name] MRN: [id number] DOB: June 17, 1985  Referred by: Pcp, No Reason for referral : High Risk Managed Medicaid (Unsuccessful telephone outreach)  An unsuccessful telephone outreach was attempted today. The patient was referred to the case management team for assistance with care management and care coordination.   Follow Up Plan: The care management team will reach out to the patient again over the next 30 business  days.   Kathi Der RN, BSN [place]  [employer], production - Managed Medicaid High Risk [phone]

## 2022-04-04 ENCOUNTER — Telehealth: Payer: Self-pay | Admitting: Cardiology

## 2022-04-04 DIAGNOSIS — R609 Edema, unspecified: Secondary | ICD-10-CM

## 2022-04-04 NOTE — Telephone Encounter (Signed)
Christina from [facility] called in stating she has faxed over some information but has not heard back. She states pt has gained 13 lbs since starting program and they are concerned for fluid overload and retention.

## 2022-04-06 NOTE — Telephone Encounter (Signed)
Spoke with pt about message from [facility]. Per Dr. Bing Matter she agreed to come for blood work on Monday. BMP, ProBNP sent to lab.

## 2022-04-09 ENCOUNTER — Ambulatory Visit: Payer: Medicaid Other | Attending: Cardiology | Admitting: Cardiology

## 2022-04-09 NOTE — Progress Notes (Deleted)
Electrophysiology Office Note   Date:  04/09/2022   ID:  Latoya [name], DOB 07/03/1985, MRN [id number]  PCP:  Pcp, No  Cardiologist:  *** Primary Electrophysiologist: *** Rebeckah Masih Jorja Loa, MD    Chief Complaint: ***   History of Present Illness: Latoya [name] is a 36 y.o. female who is being seen today for the evaluation of *** at the request of Georgeanna Lea, MD. Presenting today for electrophysiology evaluation.    Today, she denies*** symptoms of palpitations, chest pain, shortness of breath, orthopnea, PND, lower extremity edema, claudication, dizziness, presyncope, syncope, bleeding, or neurologic sequela. The patient is tolerating medications without difficulties.    Past Medical History:  Diagnosis Date   Abnormal CXR 10/23/2021   Acute bacterial endocarditis 01/07/2019   Last Assessment & Plan:  Formatting of this note might be different from the original. Tricuspid valve TTE 09/02 positive for mobile vegetations are noted on the tricuspid valve, suggestive of Endocarditis, with moderate tricuspid regurgitation. . Estimated EF 65 to 70%.  Normal LV and RV structure and function.  - Centralized cardiac monitoring - Continue IV antibiotic therapy per ID.  Currently    Acute pulmonary embolism (HCC) 09/13/2021   Acute saddle pulmonary embolism (HCC) 10/11/2021   Ambulatory dysfunction 09/06/2021   Anemia 01/09/2019   Last Assessment & Plan:  Formatting of this note might be different from the original. Stable at this time  -Trend CBC - Anemia work-up   Bilateral pleural effusion 12/06/2021   Candidemia (HCC) 10/12/2021   Chronic lumbar pain 09/06/2021   Chronic pain 09/07/2021   Complete heart block (HCC) 12/12/2021   Dysphonia 12/28/2021   Endocarditis of tricuspid valve 07/29/2019   Gram-positive bacteremia 01/07/2019   Last Assessment & Plan:  Formatting of this note might be different from the original. MSSA  - Continue vancomycin IV - Followed by ID.  See acute bacterial  endocarditis for further plan.   HCV antibody positive 04/05/2019   Hepatitis C 12/06/2021   Heroin addiction (HCC)    History of bacterial endocarditis 08/02/2021   History of lice 09/06/2021   Hyponatremia 08/04/2021   Infection of lumbar spine (HCC) 08/03/2019   Iron deficiency anemia 01/10/2019   IV drug abuse (HCC)    Leukocytosis 12/12/2021   Lumbar discitis 07/31/2019   Microcytic hypochromic anemia 08/03/2019   MRSA (methicillin resistant staph aureus) culture positive 08/03/2019   MRSA bacteremia 08/27/2021   Obesity (BMI 30-39.9) 10/23/2021   Opioid use disorder 04/05/2019   Pleuritic chest pain 10/13/2021   Pneumonia of left lower lobe due to infectious organism 10/11/2021   Polysubstance abuse (HCC) 01/07/2019   Last Assessment & Plan:  Formatting of this note might be different from the original. - Psychiatry following, who Joelys Staubs continue to monitor vitals and consider low dose subutex as patient becomes more hemodynamically stable - Counseling Formatting of this note might be different from the original. Last Assessment & Plan: Formatting of this note might be different from the original. - Psychiatry fo   Postoperative pain 12/12/2021   Pulmonary hypertension (HCC) 12/06/2021   Right ventricular dysfunction 12/12/2021   S/P TVR (tricuspid valve replacement) 12/12/2021   Septic pulmonary embolism (HCC) 08/27/2021   Septic shock (HCC) 01/06/2019   Last Assessment & Plan:  Formatting of this note might be different from the original. RESOLVED Hemodynamically stable - Continue IV fluids   Severe tricuspid regurgitation 12/06/2021   Thrombocytopenia (HCC) 10/11/2021   Past Surgical History:  Procedure Laterality Date  APPLICATION OF ANGIOVAC N/A 08/11/2021   Procedure: ABORTED APPLICATION OF ANGIOVAC;  Surgeon: Corliss Skains, MD;  Location: [facility] OR;  Service: Vascular;  Laterality: N/A;   Blood clot removed     Pulmonary   IR LUMBAR DISC ASPIRATION W/IMG GUIDE  07/29/2019   IR US GUIDE VASC ACCESS  RIGHT  10/19/2021   IR VENO/EXT/UNI RIGHT  10/19/2021   PACEMAKER IMPLANT  01/2022   TEE WITHOUT CARDIOVERSION N/A 08/08/2021   Procedure: TRANSESOPHAGEAL ECHOCARDIOGRAM (TEE);  Surgeon: Wendall Stade, MD;  Location: [facility] ENDOSCOPY;  Service: Cardiovascular;  Laterality: N/A;   TEE WITHOUT CARDIOVERSION N/A 08/11/2021   Procedure: TRANSESOPHAGEAL ECHOCARDIOGRAM (TEE);  Surgeon: Corliss Skains, MD;  Location: [facility] OR;  Service: Open Heart Surgery;  Laterality: N/A;   VALVE REPLACEMENT       Current Outpatient Medications  Medication Sig Dispense Refill   amLODipine (NORVASC) 2.5 MG tablet Take 1 tablet (2.5 mg total) by mouth daily. 90 tablet 3   buprenorphine-naloxone (SUBOXONE) 8-2 mg SUBL SL tablet Place 1 tablet under the tongue daily. Takes 3 times a day     dabigatran (PRADAXA) 150 MG CAPS capsule Take 1 capsule (150 mg total) by mouth 2 (two) times daily. 60 capsule 0   No current facility-administered medications for this visit.    Allergies:   Naltrexone   Social History:  The patient  reports that she has been smoking cigarettes. She has quit using smokeless tobacco. She reports current alcohol use. She reports that she does not currently use drugs after having used the following drugs: Heroin.   Family History:  The patient's ***family history includes Cancer in her paternal grandfather; Healthy in her mother; Hyperlipidemia in her father; Hypertension in her mother.    ROS:  Please see the history of present illness.   Otherwise, review of systems is positive for none.   All other systems are reviewed and negative.    PHYSICAL EXAM: VS:  There were no vitals taken for this visit. , BMI There is no height or weight on file to calculate BMI. GEN: Well nourished, well developed, in no acute distress  HEENT: normal  Neck: no JVD, carotid bruits, or masses Cardiac: ***RRR; no murmurs, rubs, or gallops,no edema  Respiratory:  clear to auscultation bilaterally, normal work  of breathing GI: soft, nontender, nondistended, + BS MS: no deformity or atrophy  Skin: warm and dry, ***device pocket is well healed Neuro:  Strength and sensation are intact Psych: euthymic mood, full affect  EKG:  EKG {ACTION; IS/IS GUY:[id number]} ordered today. Personal review of the ekg ordered *** shows ***  ***Device interrogation is reviewed today in detail.  See PaceArt for details.   Recent Labs: 10/25/2021: ALT 7 11/02/2021: B Natriuretic Peptide 464.1 11/10/2021: Magnesium 1.9 11/12/2021: TSH 1.438 11/20/2021: BUN 8; Creatinine, Ser 0.56; Potassium 3.9; Sodium 135 11/23/2021: Hemoglobin 11.4; Platelets 297    Lipid Panel  No results found for: "CHOL", "TRIG", "HDL", "CHOLHDL", "VLDL", "LDLCALC", "LDLDIRECT"   Wt Readings from Last 3 Encounters:  02/21/22 204 lb (92.5 kg)  02/01/22 201 lb 6.4 oz (91.4 kg)  11/25/21 186 lb 11.7 oz (84.7 kg)      Other studies Reviewed: Additional studies/ records that were reviewed today include: TTE 12/01/21  Review of the above records today demonstrates:  Ejection fraction 60 to 65% Right ventricle severely enlarged Left atrium normal size Right atrium moderately enlarged Severe tricuspid regurgitation 69 Large mobile vegetation attached to the septal tricuspid valve.  History of stress doing well lost some weight on Ozempic Anxiety I saw that he had 40 8 Thanksgiving Time Appointment-We Khari Lett Give Him As to Myself Only  ASSESSMENT AND PLAN:  1.  ***    Current medicines are reviewed at length with the patient today.   The patient {ACTIONS; HAS/DOES NOT HAVE:19233} concerns regarding her medicines.  The following changes were made today:  {NONE DEFAULTED:18576}  Labs/ tests ordered today include: *** No orders of the defined types were placed in this encounter.    Disposition:   FU with Donneisha Beane {gen number [id number]} {Days to years:10300}  Signed, Wynnie Pacetti Jorja Loa, MD  04/09/2022 3:05 PM     [facility]  [facility] [address] [place] [address] [phone] (office) [phone] (fax)

## 2022-04-10 ENCOUNTER — Encounter: Payer: Self-pay | Admitting: Cardiology

## 2022-04-27 ENCOUNTER — Other Ambulatory Visit: Payer: Medicaid Other | Admitting: Obstetrics and Gynecology

## 2022-04-27 NOTE — Patient Outreach (Signed)
Care Coordination  04/27/2022  Latoya [name] Jun 03, 1985 [id number]   Medicaid Managed Care   Unsuccessful Outreach Note  04/27/2022 Name: Latoya [name] MRN: [id number] DOB: 07/16/1985  Referred by: Pcp, No Reason for referral : High Risk Managed Medicaid (Unsuccessful telephone outreach)   A second unsuccessful telephone outreach was attempted today. The patient was referred to the case management team for assistance with care management and care coordination.   Follow Up Plan: The care management team will reach out to the patient again over the next 30 business  days.   Kathi Der RN, BSN [place]  [employer] Engineer, production - Managed Medicaid High Risk [phone]

## 2022-04-27 NOTE — Patient Instructions (Signed)
Hi Delenn, I am sorry to have missed you today as a part of your Medicaid benefit, you are eligible for care management and care coordination services at no cost or copay. I was unable to reach you by phone today but would be happy to help you with your health related needs. Please feel free to call me at [phone].  A member of the Managed Medicaid care management team will reach out to you again over the next 30 business  days.   Kathi Der RN, BSN [place]  Triad Engineer, production - Managed Medicaid High Risk [phone]

## 2022-05-04 ENCOUNTER — Telehealth: Payer: Self-pay

## 2022-05-04 NOTE — Progress Notes (Signed)
..   Medicaid Managed Care Note  05/04/2022 Name: Latoya [name] MRN: [id number] DOB: Oct 02, 1985  Latoya [name] is a 36 y.o. year old female who is a primary care patient of Pcp, No and is actively engaged with the care management team. I reached out to [name] by phone today to assist with re-scheduling a follow up visit with the RN Case Manager  Follow up plan: Unsuccessful telephone outreach attempt made. A HIPAA compliant phone message was left for the patient providing contact information and requesting a return call.  The care management team will reach out to the patient again over the next 7 days.   Weston Settle Care Guide, High Risk Medicaid Managed Care Embedded Care Coordination [facility]  [facility]

## 2022-05-28 ENCOUNTER — Ambulatory Visit: Payer: Medicaid Other | Attending: Cardiology | Admitting: Cardiology

## 2022-05-29 ENCOUNTER — Encounter: Payer: Self-pay | Admitting: Cardiology

## 2022-06-04 ENCOUNTER — Other Ambulatory Visit: Payer: Medicaid Other | Admitting: Obstetrics and Gynecology

## 2022-06-04 NOTE — Patient Outreach (Signed)
Care Coordination  06/04/2022  Latoya [name] [birth year] [id number]   Medicaid Managed Care   Unsuccessful Outreach Note  06/04/2022 Name: Latoya [name] MRN: [id number] DOB: 03-19-1986  Referred by: Pcp, No Reason for referral : High Risk Managed Medicaid (Unsuccessful telephone outreach)   Third unsuccessful telephone outreach was attempted.  The patient was referred to the case management team for assistance with care management and care coordination. The patient's primary care provider has been notified of our unsuccessful attempts to make or maintain contact with the patient. The care management team is pleased to engage with this patient at any time in the future should he/she be interested in assistance from the care management team.   Follow Up Plan: The patient has been provided with contact information for the care management team and has been advised to call with any health related questions or concerns.  We have been unable to make contact with the patient for follow up. The care management team is available to follow up with the patient after provider conversation with the patient regarding recommendation for care management engagement and subsequent re-referral to the care management team.   Aida Raider RN, BSN [place] Management Coordinator - Managed [facility] High Risk [phone].

## 2022-06-04 NOTE — Patient Instructions (Signed)
Hi Ms. Uhlig, I am sorry we have been unable to reach you- as a part of your Medicaid benefit, you are eligible for care management and care coordination services at no cost or copay.  We have been unable to reach you by phone but would be happy to help you with your health related needs. Please feel free to call me at [phone].  Aida Raider RN, BSN [place]  Triad Curator - Managed Medicaid High Risk [phone]

## 2024-05-04 ENCOUNTER — Ambulatory Visit: Attending: Cardiology | Admitting: Cardiology

## 2024-05-04 ENCOUNTER — Encounter: Payer: Self-pay | Admitting: Cardiology

## 2024-05-04 VITALS — BP 144/98 | HR 74 | Ht 66.0 in | Wt 261.0 lb

## 2024-05-04 DIAGNOSIS — Z8774 Personal history of (corrected) congenital malformations of heart and circulatory system: Secondary | ICD-10-CM | POA: Diagnosis not present

## 2024-05-04 DIAGNOSIS — I272 Pulmonary hypertension, unspecified: Secondary | ICD-10-CM | POA: Insufficient documentation

## 2024-05-04 DIAGNOSIS — Z950 Presence of cardiac pacemaker: Secondary | ICD-10-CM | POA: Insufficient documentation

## 2024-05-04 DIAGNOSIS — R0609 Other forms of dyspnea: Secondary | ICD-10-CM | POA: Insufficient documentation

## 2024-05-04 DIAGNOSIS — Z954 Presence of other heart-valve replacement: Secondary | ICD-10-CM | POA: Diagnosis not present

## 2024-05-04 NOTE — Progress Notes (Unsigned)
 " Cardiology Office Note:    Date:  05/04/2024   ID:  Latoya [name], DOB Oct 29, 1985, MRN [id number]  PCP:  Pcp, No  Cardiologist:  Lamar Fitch, MD    Referring MD: No ref. provider found   No chief complaint on file. Have some swollen legs as well as shortness of breath  History of Present Illness:    Latoya [name] is a 38 y.o. female past medical history significant for endocarditis secondary to IV drug abuse eventually ended up having a tricuspid valve replacement which was done in August 2023 at [facility] which was more than 2 years ago, also history of multiple septic emboli she was anticoagulated also history of complete heart block that required permanent pacemaker implantation she does have leadless device by [employer], she also had multiple episode of ventricular tachycardia, she disappeared from follow-up and I did see her last time in October 2023 which is more than 2 years ago she did see 1 cardiologist [place] looks like pacemaker has been checked other than that absolutely no follow-up.  Comes today to my office with her daughter.  She denies have any chest pain tightness squeezing pressure burning chest but described to have lack of stamina as well as swelling of lower extremities which is barely present today on physical exam.  Denies have any dizziness or passing out.  Does not use drugs anymore  Past Medical History:  Diagnosis Date   Abnormal CXR 10/23/2021   Acute bacterial endocarditis 01/07/2019   Last Assessment & Plan:  Formatting of this note might be different from the original. Tricuspid valve TTE 09/02 positive for mobile vegetations are noted on the tricuspid valve, suggestive of Endocarditis, with moderate tricuspid regurgitation. . Estimated EF 65 to 70%.  Normal LV and RV structure and function.  - Centralized cardiac monitoring - Continue IV antibiotic therapy per ID.  Currently    Acute pulmonary embolism (HCC) 09/13/2021   Acute saddle pulmonary embolism (HCC)  10/11/2021   Ambulatory dysfunction 09/06/2021   Anemia 01/09/2019   Last Assessment & Plan:  Formatting of this note might be different from the original. Stable at this time  -Trend CBC - Anemia work-up   Bilateral pleural effusion 12/06/2021   Candidemia (HCC) 10/12/2021   Chronic lumbar pain 09/06/2021   Chronic pain 09/07/2021   Complete heart block (HCC) 12/12/2021   Dysphonia 12/28/2021   Endocarditis of tricuspid valve 07/29/2019   Gram-positive bacteremia 01/07/2019   Last Assessment & Plan:  Formatting of this note might be different from the original. MSSA  - Continue vancomycin  IV - Followed by ID.  See acute bacterial endocarditis for further plan.   HCV antibody positive 04/05/2019   Hepatitis C 12/06/2021   Heroin addiction (HCC)    History of bacterial endocarditis 08/02/2021   History of lice 09/06/2021   Hyponatremia 08/04/2021   Infection of lumbar spine (HCC) 08/03/2019   Iron  deficiency anemia 01/10/2019   IV drug abuse (HCC)    Leukocytosis 12/12/2021   Lumbar discitis 07/31/2019   Microcytic hypochromic anemia 08/03/2019   MRSA (methicillin resistant staph aureus) culture positive 08/03/2019   MRSA bacteremia 08/27/2021   Obesity (BMI 30-39.9) 10/23/2021   Opioid use disorder 04/05/2019   Pleuritic chest pain 10/13/2021   Pneumonia of left lower lobe due to infectious organism 10/11/2021   Polysubstance abuse (HCC) 01/07/2019   Last Assessment & Plan:  Formatting of this note might be different from the original. - Psychiatry following, who will continue to  monitor vitals and consider low dose subutex  as patient becomes more hemodynamically stable - Counseling Formatting of this note might be different from the original. Last Assessment & Plan: Formatting of this note might be different from the original. - Psychiatry fo   Postoperative pain 12/12/2021   Pulmonary hypertension (HCC) 12/06/2021   Right ventricular dysfunction 12/12/2021   S/P TVR (tricuspid valve replacement) 12/12/2021   Septic pulmonary  embolism (HCC) 08/27/2021   Septic shock (HCC) 01/06/2019   Last Assessment & Plan:  Formatting of this note might be different from the original. RESOLVED Hemodynamically stable - Continue IV fluids   Severe tricuspid regurgitation 12/06/2021   Thrombocytopenia 10/11/2021    Past Surgical History:  Procedure Laterality Date   APPLICATION OF ANGIOVAC N/A 08/11/2021   Procedure: ABORTED APPLICATION OF SUANNE;  Surgeon: Shyrl Linnie KIDD, MD;  Location: [facility] OR;  Service: Vascular;  Laterality: N/A;   Blood clot removed     Pulmonary   IR LUMBAR DISC ASPIRATION W/IMG GUIDE  07/29/2019   IR US  GUIDE VASC ACCESS RIGHT  10/19/2021   IR VENO/EXT/UNI RIGHT  10/19/2021   PACEMAKER IMPLANT  01/2022   TEE WITHOUT CARDIOVERSION N/A 08/08/2021   Procedure: TRANSESOPHAGEAL ECHOCARDIOGRAM (TEE);  Surgeon: Delford Maude BROCKS, MD;  Location: [facility] ENDOSCOPY;  Service: Cardiovascular;  Laterality: N/A;   TEE WITHOUT CARDIOVERSION N/A 08/11/2021   Procedure: TRANSESOPHAGEAL ECHOCARDIOGRAM (TEE);  Surgeon: Shyrl Linnie KIDD, MD;  Location: [facility] OR;  Service: Open Heart Surgery;  Laterality: N/A;   VALVE REPLACEMENT      Current Medications: Active Medications[1]   Allergies:   Naltrexone   Social History   Socioeconomic History   Marital status: Single    Spouse name: Not on file   Number of children: Not on file   Years of education: Not on file   Highest education level: Not on file  Occupational History   Not on file  Tobacco Use   Smoking status: Light Smoker    Types: Cigarettes   Smokeless tobacco: Former  Advertising Account Planner   Vaping status: Never Used  Substance and Sexual Activity   Alcohol use: Yes   Drug use: Not Currently    Types: Heroin    Comment: Heroin    Sexual activity: Yes  Other Topics Concern   Not on file  Social History Narrative   Lives with her parents and her two girls who are 47 and 37 years old.    Social Drivers of Health   Tobacco Use: High Risk (05/04/2024)    Patient History    Smoking Tobacco Use: Light Smoker    Smokeless Tobacco Use: Former    Passive Exposure: Not on Actuary Strain: Medium Risk (12/05/2021)   Received from [facility]   Overall Financial Resource Strain (CARDIA)    Difficulty of Paying Living Expenses: Somewhat hard  Food Insecurity: No Food Insecurity (02/26/2022)   Hunger Vital Sign    Worried About Running Out of Food in the Last Year: Never true    Ran Out of Food in the Last Year: Never true  Transportation Needs: No Transportation Needs (01/19/2022)   PRAPARE - Administrator, Civil Service (Medical): No    Lack of Transportation (Non-Medical): No  Physical Activity: Not on file  Stress: Not on file  Social Connections: Not on file  Depression (PHQ2-9): Low Risk (09/26/2021)   Depression (PHQ2-9)    PHQ-2 Score: 2  Alcohol Screen: Not on file  Housing: Low Risk (02/26/2022)   Housing    Last Housing Risk Score: 0  Utilities: Not At Risk (01/19/2022)   AHC Utilities    Threatened with loss of utilities: No  Health Literacy: Not on file     Family History: The patient's family history includes Cancer in her paternal grandfather; Healthy in her mother; Hyperlipidemia in her father; Hypertension in her mother. ROS:   Please see the history of present illness.    All 14 point review of systems negative except as described per history of present illness  EKGs/Labs/Other Studies Reviewed:    EKG Interpretation Date/Time:  Monday May 04 2024 16:17:02 EST Ventricular Rate:  74 PR Interval:    QRS Duration:  188 QT Interval:  486 QTC Calculation: 539 R Axis:   -86  Text Interpretation: Sinus rhythm with complete heart block and Ventricular-paced rhythm When compared with ECG of 11-Oct-2021 01:30, Electronic ventricular pacemaker has replaced Sinus rhythm Confirmed by Bernie [place] [phone]) on 05/04/2024 4:26:25 PM    Recent Labs: No results found for requested labs  within last 365 days.  Recent Lipid Panel No results found for: CHOL, TRIG, HDL, CHOLHDL, VLDL, LDLCALC, LDLDIRECT  Physical Exam:    VS:  BP (!) 144/98   Pulse 74   Ht 5' 6 (1.676 m)   Wt 261 lb (118.4 kg)   SpO2 95%   BMI 42.13 kg/m     Wt Readings from Last 3 Encounters:  05/04/24 261 lb (118.4 kg)  02/21/22 204 lb (92.5 kg)  02/01/22 201 lb 6.4 oz (91.4 kg)     GEN:  Well nourished, well developed in no acute distress HEENT: Normal NECK: No JVD; No carotid bruits LYMPHATICS: No lymphadenopathy CARDIAC: RRR, no murmurs, no rubs, no gallops RESPIRATORY:  Clear to auscultation without rales, wheezing or rhonchi  ABDOMEN: Soft, non-tender, non-distended MUSCULOSKELETAL: Normal edema; No deformity  SKIN: Warm and dry LOWER EXTREMITIES: no swelling NEUROLOGIC:  Alert and oriented x 3 PSYCHIATRIC:  Normal affect   ASSESSMENT:    1. Pulmonary hypertension (HCC)   2. S/P TVR (tricuspid valve replacement)   3. S/P patent foramen ovale closure   4. Pacemaker Leadless device    PLAN:    In order of problems listed above:  Status post tricuspid valve replacement.  Will do echocardiogram to assess the valve, it is a bioprosthesis, done at [facility].  I have difficult time finding the size and type of prosthesis, it was done because of endocarditis related to IV abuse.  At the same time she did have PFO closure. Pulmonary hypertension she does have some swelling of lower extremities, will get echocardiogram to assess the valve as well as assess the degree of pulmonary pressure. Pacemaker present [employer] leadless device, will schedule her to have pacemaker interrogation.  Also when she come for pacemaker interrogation will do blood test that will include having Chem-7 as well as proBNP.  Anticipate need to initiate some diuretics. History of PE I suspect it was secondary to her drug abuse and endocarditis.  She is not anticoagulated right now.   Medication  Adjustments/Labs and Tests Ordered: Current medicines are reviewed at length with the patient today.  Concerns regarding medicines are outlined above.  Orders Placed This Encounter  Procedures   EKG 12-Lead   Medication changes: No orders of the defined types were placed in this encounter.   Signed, [place] DOROTHA Bernie, MD, [facility] 05/04/2024 4:37 PM    [facility]    [  1]  Current Meds  Medication Sig   Buprenorphine  HCl-Naloxone  HCl 8-2 MG FILM Place 1 Film under the tongue.   buPROPion (WELLBUTRIN XL) 150 MG 24 hr tablet Take 150 mg by mouth daily.   hydrOXYzine  (ATARAX ) 25 MG tablet Take 25 mg by mouth 2 (two) times daily as needed.   sertraline (ZOLOFT) 100 MG tablet Take 150 mg by mouth daily.   sertraline (ZOLOFT) 50 MG tablet Take 50 mg by mouth daily.   traZODone  (DESYREL ) 150 MG tablet Take 75 mg by mouth at bedtime.   "

## 2024-05-04 NOTE — Patient Instructions (Addendum)
 Medication Instructions:  Your physician recommends that you continue on your current medications as directed. Please refer to the Current Medication list given to you today.  *If you need a refill on your cardiac medications before your next appointment, please call your pharmacy*   Lab Work: Your physician recommends that you return for lab work in: when comes in for interrogation You need to have labs done when you are fasting.  You can come Monday through Friday 8:30 am to 12:00 pm and 1:15 to 4:30. You do not need to make an appointment as the order has already been placed.     Testing/Procedures: Your physician has requested that you have an echocardiogram. Echocardiography is a painless test that uses sound waves to create images of your heart. It provides your doctor with information about the size and shape of your heart and how well your hearts chambers and valves are working. This procedure takes approximately one hour. There are no restrictions for this procedure. Please do NOT wear cologne, perfume, aftershave, or lotions (deodorant is allowed). Please arrive 15 minutes prior to your appointment time.  Please note: We ask at that you not bring children with you during ultrasound (echo/ vascular) testing. Due to room size and safety concerns, children are not allowed in the ultrasound rooms during exams. Our front office staff cannot provide observation of children in our lobby area while testing is being conducted. An adult accompanying a patient to their appointment will only be allowed in the ultrasound room at the discretion of the ultrasound technician under special circumstances. We apologize for any inconvenience.    Follow-Up: At [facility], you and your health needs are our priority.  As part of our continuing mission to provide you with exceptional heart care, we have created designated Provider Care Teams.  These Care Teams include your primary Cardiologist (physician)  and Advanced Practice Providers (APPs -  Physician Assistants and Nurse Practitioners) who all work together to provide you with the care you need, when you need it.  We recommend signing up for the patient portal called MyChart.  Sign up information is provided on this After Visit Summary.  MyChart is used to connect with patients for Virtual Visits (Telemedicine).  Patients are able to view lab/test results, encounter notes, upcoming appointments, etc.  Non-urgent messages can be sent to your provider as well.   To learn more about what you can do with MyChart, go to [email or web address].    Your next appointment:   1 month(s)  The format for your next appointment:   In Person  Provider:   Lamar Fitch, MD    Other Instructions NA

## 2024-05-25 ENCOUNTER — Ambulatory Visit: Admitting: Cardiology

## 2024-05-26 ENCOUNTER — Telehealth: Payer: Self-pay

## 2024-05-26 DIAGNOSIS — Z950 Presence of cardiac pacemaker: Secondary | ICD-10-CM

## 2024-05-26 NOTE — Telephone Encounter (Signed)
 Referred to EP for pacemaker follow up

## 2024-05-28 ENCOUNTER — Ambulatory Visit: Attending: Cardiology

## 2024-05-28 DIAGNOSIS — Z954 Presence of other heart-valve replacement: Secondary | ICD-10-CM | POA: Diagnosis not present

## 2024-06-02 DIAGNOSIS — F1120 Opioid dependence, uncomplicated: Secondary | ICD-10-CM | POA: Insufficient documentation

## 2024-06-02 LAB — ECHOCARDIOGRAM COMPLETE
AR max vel: 3.23 cm2
AV Area VTI: 3.21 cm2
AV Area mean vel: 3.15 cm2
AV Mean grad: 2.3 mmHg
AV Peak grad: 4.3 mmHg
Ao pk vel: 1.04 m/s
Area-P 1/2: 5.66 cm2
MV M vel: 2.63 m/s
MV Peak grad: 27.7 mmHg
MV VTI: 2.01 cm2
S' Lateral: 3.2 cm

## 2024-06-04 ENCOUNTER — Encounter: Payer: Self-pay | Admitting: Cardiology

## 2024-06-04 ENCOUNTER — Ambulatory Visit: Attending: Cardiology | Admitting: Cardiology

## 2024-06-04 VITALS — BP 126/92 | HR 98 | Ht 66.0 in | Wt 260.1 lb

## 2024-06-04 DIAGNOSIS — Z8774 Personal history of (corrected) congenital malformations of heart and circulatory system: Secondary | ICD-10-CM | POA: Insufficient documentation

## 2024-06-04 DIAGNOSIS — Z954 Presence of other heart-valve replacement: Secondary | ICD-10-CM | POA: Insufficient documentation

## 2024-06-04 DIAGNOSIS — R0600 Dyspnea, unspecified: Secondary | ICD-10-CM | POA: Insufficient documentation

## 2024-06-04 DIAGNOSIS — E669 Obesity, unspecified: Secondary | ICD-10-CM | POA: Diagnosis present

## 2024-06-04 DIAGNOSIS — Z950 Presence of cardiac pacemaker: Secondary | ICD-10-CM | POA: Diagnosis present

## 2024-06-04 NOTE — Progress Notes (Signed)
 " Cardiology Office Note:    Date:  06/04/2024   ID:  Latoya [name], DOB 17-Jun-1985, MRN [id number]  PCP:  Pcp, No  Cardiologist:  Lamar Fitch, MD    Referring MD: Fitch Lamar PARAS, MD   No chief complaint on file. Doing fine  History of Present Illness:     Latoya [name] is a 39 y.o. 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  She disappeared from follow-up and I did see her at the end of December she is doing quite okay she described to have some swelling of lower extremities as well as shortness of breath but no chest pain tightness squeezing pressure burning chest no dizziness no passing out no palpitations.  Past Medical History:  Diagnosis Date   Abnormal CXR 10/23/2021   Acute bacterial endocarditis 01/07/2019   Last Assessment & Plan:  Formatting of this note might be different from the original. Tricuspid valve TTE 09/02 positive for mobile vegetations are noted on the tricuspid valve, suggestive of Endocarditis, with moderate tricuspid regurgitation. . Estimated EF 65 to 70%.  Normal LV and RV structure and function.  - Centralized cardiac monitoring - Continue IV antibiotic therapy per ID.  Currently    Acute pulmonary embolism (HCC) 09/13/2021   Acute saddle pulmonary embolism (HCC) 10/11/2021   Ambulatory dysfunction 09/06/2021   Anemia 01/09/2019   Last Assessment & Plan:  Formatting of this note might be different from the original. Stable at this time  -Trend CBC - Anemia work-up   Bilateral pleural effusion 12/06/2021   Candidemia (HCC) 10/12/2021   Chronic lumbar pain 09/06/2021   Chronic pain 09/07/2021   Complete heart block (HCC) 12/12/2021   Dysphonia  12/28/2021   Endocarditis of tricuspid valve 07/29/2019   Gram-positive bacteremia 01/07/2019   Last Assessment & Plan:  Formatting of this note might be different from the original. MSSA  - Continue vancomycin  IV - Followed by ID.  See acute bacterial endocarditis for further plan.   HCV antibody positive 04/05/2019   Hepatitis C 12/06/2021   Heroin addiction (HCC)    History of bacterial endocarditis 08/02/2021   History of lice 09/06/2021   History of pulmonary embolism 02/21/2022   Hyponatremia 08/04/2021   Infection of lumbar spine (HCC) 08/03/2019   Insomnia 01/30/2019   Iron  deficiency anemia 01/10/2019   IV drug abuse (HCC)    Leukocytosis 12/12/2021   Lumbar discitis 07/31/2019   Microcytic hypochromic anemia 08/03/2019   MRSA (methicillin resistant staph aureus) culture positive 08/03/2019   MRSA bacteremia 08/27/2021   Obesity (BMI 30-39.9) 10/23/2021   Opioid use disorder 04/05/2019   Pacemaker Leadless device 02/21/2022   Pleuritic chest pain 10/13/2021   Pneumonia of left lower lobe due to infectious organism 10/11/2021   Polysubstance abuse (HCC) 01/07/2019   Last Assessment & Plan:  Formatting of this note might be different from the original. - Psychiatry following, who will continue to monitor vitals and consider low dose subutex  as patient becomes more hemodynamically stable - Counseling Formatting of this note might be different from the original. Last Assessment & Plan: Formatting of this note might be different from the original. - Psychiatry fo   Postoperative pain 12/12/2021   Pulmonary hypertension (  HCC) 12/06/2021   Right ventricular dysfunction 12/12/2021   S/P patent foramen ovale closure 02/01/2022   S/P TVR (tricuspid valve replacement) 12/12/2021   Septic pulmonary embolism (HCC) 08/27/2021   Septic shock (HCC) 01/06/2019   Last Assessment & Plan:  Formatting of this note might be different from the original. RESOLVED Hemodynamically stable - Continue  IV fluids   Severe tricuspid regurgitation 12/06/2021   Thrombocytopenia 10/11/2021   Weakness 02/01/2022   Wound of left lower extremity 01/11/2019    Past Surgical History:  Procedure Laterality Date   APPLICATION OF ANGIOVAC N/A 08/11/2021   Procedure: ABORTED APPLICATION OF SUANNE;  Surgeon: Shyrl Linnie KIDD, MD;  Location: [facility] OR;  Service: Vascular;  Laterality: N/A;   Blood clot removed     Pulmonary   IR LUMBAR DISC ASPIRATION W/IMG GUIDE  07/29/2019   IR US  GUIDE VASC ACCESS RIGHT  10/19/2021   IR VENO/EXT/UNI RIGHT  10/19/2021   PACEMAKER IMPLANT  01/2022   TEE WITHOUT CARDIOVERSION N/A 08/08/2021   Procedure: TRANSESOPHAGEAL ECHOCARDIOGRAM (TEE);  Surgeon: Delford Maude BROCKS, MD;  Location: [facility] ENDOSCOPY;  Service: Cardiovascular;  Laterality: N/A;   TEE WITHOUT CARDIOVERSION N/A 08/11/2021   Procedure: TRANSESOPHAGEAL ECHOCARDIOGRAM (TEE);  Surgeon: Shyrl Linnie KIDD, MD;  Location: [facility] OR;  Service: Open Heart Surgery;  Laterality: N/A;   VALVE REPLACEMENT      Current Medications: Active Medications[1]   Allergies:   Naltrexone   Social History   Socioeconomic History   Marital status: Single    Spouse name: Not on file   Number of children: Not on file   Years of education: Not on file   Highest education level: Not on file  Occupational History   Not on file  Tobacco Use   Smoking status: Light Smoker    Types: Cigarettes   Smokeless tobacco: Former  [employer]vertising [employer]   Vaping status: Never Used  Substance and Sexual Activity   Alcohol use: Yes   Drug use: Not Currently    Types: Heroin    Comment: Heroin    Sexual activity: Yes  Other Topics Concern   Not on file  Social History Narrative   Lives with her parents and her two girls who are 58 and 38 years old.    Social Drivers of Health   Tobacco Use: High Risk (06/04/2024)   Patient History    Smoking Tobacco Use: Light Smoker    Smokeless Tobacco Use: Former    Passive Exposure: Not on  Actuary Strain: Medium Risk (12/05/2021)   Received from [facility]   Overall Financial Resource Strain (CARDIA)    Difficulty of Paying Living Expenses: Somewhat hard  Food Insecurity: No Food Insecurity (02/26/2022)   Hunger Vital Sign    Worried About Running Out of Food in the Last Year: Never true    Ran Out of Food in the Last Year: Never true  Transportation Needs: No Transportation Needs (01/19/2022)   PRAPARE - Administrator, Civil Service (Medical): No    Lack of Transportation (Non-Medical): No  Physical Activity: Not on file  Stress: Not on file  Social Connections: Not on file  Depression (PHQ2-9): Low Risk (09/26/2021)   Depression (PHQ2-9)    PHQ-2 Score: 2  Alcohol Screen: Not on file  Housing: Low Risk (02/26/2022)   Housing    Last Housing Risk Score: 0  Utilities: Not At Risk (01/19/2022)   AHC Utilities    Threatened with  loss of utilities: No  Health Literacy: Not on file     Family History: The patient's family history includes Cancer in her paternal grandfather; Healthy in her mother; Hyperlipidemia in her father; Hypertension in her mother. ROS:   Please see the history of present illness.    All 14 point review of systems negative except as described per history of present illness  EKGs/Labs/Other Studies Reviewed:         Recent Labs: No results found for requested labs within last 365 days.  Recent Lipid Panel No results found for: CHOL, TRIG, HDL, CHOLHDL, VLDL, LDLCALC, LDLDIRECT  Physical Exam:    VS:  BP (!) 126/92   Pulse 98   Ht 5' 6 (1.676 m)   Wt 260 lb 2 oz (118 kg)   SpO2 96%   BMI 41.99 kg/m     Wt Readings from Last 3 Encounters:  06/04/24 260 lb 2 oz (118 kg)  05/04/24 261 lb (118.4 kg)  02/21/22 204 lb (92.5 kg)     GEN:  Well nourished, well developed in no acute distress HEENT: Normal NECK: No JVD; No carotid bruits LYMPHATICS: No lymphadenopathy CARDIAC: RRR, no  murmurs, no rubs, no gallops RESPIRATORY:  Clear to auscultation without rales, wheezing or rhonchi  ABDOMEN: Soft, non-tender, non-distended MUSCULOSKELETAL:  No edema; No deformity  SKIN: Warm and dry LOWER EXTREMITIES: no swelling NEUROLOGIC:  Alert and oriented x 3 PSYCHIATRIC:  Normal affect   ASSESSMENT:    1. S/P TVR (tricuspid valve replacement)   2. S/P patent foramen ovale closure   3. Pacemaker Leadless device   4. Obesity (BMI 30-39.9)    PLAN:    In order of problems listed above:  Status post tracheostomy valve replacement is a bioprosthetic valve done at [facility].  Doing well echocardiogram reviewed with patient only trivial tricuspid regurgitation. History of pulmonary hypertension with last echocardiogram did not show that. Status post PFO closure.  Noted. Leadless pacemaker by [employer], we will refer her to our pacemaker clinic. Echocardiogram showed mildly reduced left ventricle ejection fraction actually lower limits of normal 50 to 55%.  There is probably related to pacemaker, will get Chem-7 if Chem-7 is fine we will try to put her on a small dose of ARB which should help with her blood pressure which seems to be elevated   Medication Adjustments/Labs and Tests Ordered: Current medicines are reviewed at length with the patient today.  Concerns regarding medicines are outlined above.  No orders of the defined types were placed in this encounter.  Medication changes: No orders of the defined types were placed in this encounter.   Signed, Lamar DOROTHA Fitch, MD, [facility] 06/04/2024 11:12 AM    [facility]    [1]  Current Meds  Medication Sig   Buprenorphine  HCl-Naloxone  HCl 8-2 MG FILM Place 1 Film under the tongue.   buPROPion (WELLBUTRIN XL) 150 MG 24 hr tablet Take 150 mg by mouth daily.   hydrOXYzine  (ATARAX ) 25 MG tablet Take 25 mg by mouth 2 (two) times daily as needed.   sertraline (ZOLOFT) 100 MG tablet Take 150 mg by mouth daily.    traZODone  (DESYREL ) 150 MG tablet Take 75 mg by mouth at bedtime.   "

## 2024-06-04 NOTE — Patient Instructions (Signed)
 Medication Instructions:  Your physician recommends that you continue on your current medications as directed. Please refer to the Current Medication list given to you today.  *If you need a refill on your cardiac medications before your next appointment, please call your pharmacy*  Lab Work: Your physician recommends that you return for lab work in: Today for BMP and ProBNP  If you have labs (blood work) drawn today and your tests are completely normal, you will receive your results only by: MyChart Message (if you have MyChart) OR A paper copy in the mail If you have any lab test that is abnormal or we need to change your treatment, we will call you to review the results.  Testing/Procedures: NONE  Follow-Up: At [facility], you and your health needs are our priority.  As part of our continuing mission to provide you with exceptional heart care, our providers are all part of one team.  This team includes your primary Cardiologist (physician) and Advanced Practice Providers or APPs (Physician Assistants and Nurse Practitioners) who all work together to provide you with the care you need, when you need it.  Your next appointment:   3 month(s)  Provider:   Lamar Fitch, MD    We recommend signing up for the patient portal called MyChart.  Sign up information is provided on this After Visit Summary.  MyChart is used to connect with patients for Virtual Visits (Telemedicine).  Patients are able to view lab/test results, encounter notes, upcoming appointments, etc.  Non-urgent messages can be sent to your provider as well.   To learn more about what you can do with MyChart, go to [email or web address].   Other Instructions

## 2024-06-05 ENCOUNTER — Ambulatory Visit: Payer: Self-pay | Admitting: Cardiology

## 2024-07-06 ENCOUNTER — Ambulatory Visit: Admitting: Cardiology

## 2024-09-02 ENCOUNTER — Ambulatory Visit: Admitting: Cardiology
